# Patient Record
Sex: Male | Born: 1940 | Race: White | Hispanic: No | Smoking: Current every day smoker
Health system: Southern US, Community
[De-identification: ages and names within clinical notes are randomized; demographics above are authoritative.]

## PROBLEM LIST (undated history)

## (undated) DIAGNOSIS — I10 Essential (primary) hypertension: Secondary | ICD-10-CM

## (undated) DIAGNOSIS — Z87442 Personal history of urinary calculi: Secondary | ICD-10-CM

## (undated) DIAGNOSIS — R0600 Dyspnea, unspecified: Secondary | ICD-10-CM

## (undated) DIAGNOSIS — J449 Chronic obstructive pulmonary disease, unspecified: Secondary | ICD-10-CM

## (undated) DIAGNOSIS — K219 Gastro-esophageal reflux disease without esophagitis: Secondary | ICD-10-CM

## (undated) DIAGNOSIS — M1990 Unspecified osteoarthritis, unspecified site: Secondary | ICD-10-CM

## (undated) DIAGNOSIS — Z7951 Long term (current) use of inhaled steroids: Secondary | ICD-10-CM

## (undated) DIAGNOSIS — I499 Cardiac arrhythmia, unspecified: Secondary | ICD-10-CM

## (undated) DIAGNOSIS — I509 Heart failure, unspecified: Secondary | ICD-10-CM

## (undated) HISTORY — DX: Essential (primary) hypertension: I10

## (undated) HISTORY — DX: Chronic obstructive pulmonary disease, unspecified: J44.9

## (undated) HISTORY — DX: Heart failure, unspecified: I50.9

## (undated) HISTORY — DX: Gastro-esophageal reflux disease without esophagitis: K21.9

## (undated) HISTORY — DX: Unspecified osteoarthritis, unspecified site: M19.90

## (undated) HISTORY — DX: Dyspnea, unspecified: R06.00

## (undated) HISTORY — DX: Personal history of urinary calculi: Z87.442

## (undated) HISTORY — DX: Cardiac arrhythmia, unspecified: I49.9

## (undated) HISTORY — DX: Long term (current) use of inhaled steroids: Z79.51

## (undated) HISTORY — PX: LEG SURGERY: SHX1003

---

## 2004-10-02 ENCOUNTER — Ambulatory Visit: Payer: Self-pay | Admitting: Family Medicine

## 2014-10-21 DIAGNOSIS — I480 Paroxysmal atrial fibrillation: Secondary | ICD-10-CM | POA: Insufficient documentation

## 2014-10-21 DIAGNOSIS — I10 Essential (primary) hypertension: Secondary | ICD-10-CM

## 2014-10-21 DIAGNOSIS — F17210 Nicotine dependence, cigarettes, uncomplicated: Secondary | ICD-10-CM | POA: Insufficient documentation

## 2014-10-21 HISTORY — DX: Paroxysmal atrial fibrillation: I48.0

## 2014-10-21 HISTORY — DX: Essential (primary) hypertension: I10

## 2014-10-21 HISTORY — DX: Nicotine dependence, cigarettes, uncomplicated: F17.210

## 2016-01-21 DIAGNOSIS — R062 Wheezing: Secondary | ICD-10-CM | POA: Diagnosis not present

## 2016-01-28 DIAGNOSIS — E782 Mixed hyperlipidemia: Secondary | ICD-10-CM | POA: Diagnosis not present

## 2016-01-28 DIAGNOSIS — R0602 Shortness of breath: Secondary | ICD-10-CM | POA: Diagnosis not present

## 2016-01-28 DIAGNOSIS — R7301 Impaired fasting glucose: Secondary | ICD-10-CM | POA: Diagnosis not present

## 2016-01-28 DIAGNOSIS — R5383 Other fatigue: Secondary | ICD-10-CM | POA: Diagnosis not present

## 2016-01-28 DIAGNOSIS — I480 Paroxysmal atrial fibrillation: Secondary | ICD-10-CM | POA: Diagnosis not present

## 2016-02-21 DIAGNOSIS — R062 Wheezing: Secondary | ICD-10-CM | POA: Diagnosis not present

## 2016-02-24 DIAGNOSIS — R55 Syncope and collapse: Secondary | ICD-10-CM | POA: Diagnosis not present

## 2016-02-24 DIAGNOSIS — Z720 Tobacco use: Secondary | ICD-10-CM | POA: Diagnosis not present

## 2016-02-24 DIAGNOSIS — R0689 Other abnormalities of breathing: Secondary | ICD-10-CM | POA: Diagnosis not present

## 2016-02-24 DIAGNOSIS — J441 Chronic obstructive pulmonary disease with (acute) exacerbation: Secondary | ICD-10-CM | POA: Diagnosis not present

## 2016-02-24 DIAGNOSIS — R062 Wheezing: Secondary | ICD-10-CM | POA: Diagnosis not present

## 2016-02-27 DIAGNOSIS — I10 Essential (primary) hypertension: Secondary | ICD-10-CM | POA: Diagnosis not present

## 2016-02-27 DIAGNOSIS — E784 Other hyperlipidemia: Secondary | ICD-10-CM | POA: Diagnosis not present

## 2016-02-27 DIAGNOSIS — I480 Paroxysmal atrial fibrillation: Secondary | ICD-10-CM | POA: Diagnosis not present

## 2016-02-27 DIAGNOSIS — D518 Other vitamin B12 deficiency anemias: Secondary | ICD-10-CM | POA: Diagnosis not present

## 2016-03-02 DIAGNOSIS — R079 Chest pain, unspecified: Secondary | ICD-10-CM | POA: Diagnosis not present

## 2016-03-02 DIAGNOSIS — I480 Paroxysmal atrial fibrillation: Secondary | ICD-10-CM | POA: Diagnosis not present

## 2016-03-03 DIAGNOSIS — I714 Abdominal aortic aneurysm, without rupture: Secondary | ICD-10-CM | POA: Diagnosis not present

## 2016-03-03 DIAGNOSIS — R0989 Other specified symptoms and signs involving the circulatory and respiratory systems: Secondary | ICD-10-CM | POA: Diagnosis not present

## 2016-03-04 DIAGNOSIS — I739 Peripheral vascular disease, unspecified: Secondary | ICD-10-CM | POA: Diagnosis not present

## 2016-03-05 DIAGNOSIS — R42 Dizziness and giddiness: Secondary | ICD-10-CM | POA: Diagnosis not present

## 2016-03-05 DIAGNOSIS — R002 Palpitations: Secondary | ICD-10-CM | POA: Diagnosis not present

## 2016-03-20 DIAGNOSIS — R062 Wheezing: Secondary | ICD-10-CM | POA: Diagnosis not present

## 2016-03-23 DIAGNOSIS — M25551 Pain in right hip: Secondary | ICD-10-CM | POA: Diagnosis not present

## 2016-03-23 DIAGNOSIS — I480 Paroxysmal atrial fibrillation: Secondary | ICD-10-CM | POA: Diagnosis not present

## 2016-03-23 DIAGNOSIS — R7301 Impaired fasting glucose: Secondary | ICD-10-CM | POA: Diagnosis not present

## 2016-03-23 DIAGNOSIS — M25552 Pain in left hip: Secondary | ICD-10-CM | POA: Diagnosis not present

## 2016-03-23 DIAGNOSIS — R2689 Other abnormalities of gait and mobility: Secondary | ICD-10-CM | POA: Diagnosis not present

## 2016-03-23 DIAGNOSIS — E782 Mixed hyperlipidemia: Secondary | ICD-10-CM | POA: Diagnosis not present

## 2016-03-23 DIAGNOSIS — R5383 Other fatigue: Secondary | ICD-10-CM | POA: Diagnosis not present

## 2016-03-23 DIAGNOSIS — M6281 Muscle weakness (generalized): Secondary | ICD-10-CM | POA: Diagnosis not present

## 2016-03-31 DIAGNOSIS — D518 Other vitamin B12 deficiency anemias: Secondary | ICD-10-CM | POA: Diagnosis not present

## 2016-03-31 DIAGNOSIS — I10 Essential (primary) hypertension: Secondary | ICD-10-CM | POA: Diagnosis not present

## 2016-03-31 DIAGNOSIS — E784 Other hyperlipidemia: Secondary | ICD-10-CM | POA: Diagnosis not present

## 2016-03-31 DIAGNOSIS — I480 Paroxysmal atrial fibrillation: Secondary | ICD-10-CM | POA: Diagnosis not present

## 2016-04-20 DIAGNOSIS — R062 Wheezing: Secondary | ICD-10-CM | POA: Diagnosis not present

## 2016-04-23 DIAGNOSIS — E784 Other hyperlipidemia: Secondary | ICD-10-CM | POA: Diagnosis not present

## 2016-04-23 DIAGNOSIS — D518 Other vitamin B12 deficiency anemias: Secondary | ICD-10-CM | POA: Diagnosis not present

## 2016-04-23 DIAGNOSIS — I10 Essential (primary) hypertension: Secondary | ICD-10-CM | POA: Diagnosis not present

## 2016-04-23 DIAGNOSIS — I480 Paroxysmal atrial fibrillation: Secondary | ICD-10-CM | POA: Diagnosis not present

## 2016-04-27 DIAGNOSIS — E782 Mixed hyperlipidemia: Secondary | ICD-10-CM | POA: Diagnosis not present

## 2016-04-27 DIAGNOSIS — J441 Chronic obstructive pulmonary disease with (acute) exacerbation: Secondary | ICD-10-CM | POA: Diagnosis not present

## 2016-04-27 DIAGNOSIS — I10 Essential (primary) hypertension: Secondary | ICD-10-CM | POA: Diagnosis not present

## 2016-04-27 DIAGNOSIS — R0602 Shortness of breath: Secondary | ICD-10-CM | POA: Diagnosis not present

## 2016-04-27 DIAGNOSIS — E569 Vitamin deficiency, unspecified: Secondary | ICD-10-CM | POA: Diagnosis not present

## 2016-04-27 DIAGNOSIS — N400 Enlarged prostate without lower urinary tract symptoms: Secondary | ICD-10-CM | POA: Diagnosis not present

## 2016-04-27 DIAGNOSIS — R0689 Other abnormalities of breathing: Secondary | ICD-10-CM | POA: Diagnosis not present

## 2016-04-27 DIAGNOSIS — D518 Other vitamin B12 deficiency anemias: Secondary | ICD-10-CM | POA: Diagnosis not present

## 2016-04-27 DIAGNOSIS — E611 Iron deficiency: Secondary | ICD-10-CM | POA: Diagnosis not present

## 2016-04-27 DIAGNOSIS — R5383 Other fatigue: Secondary | ICD-10-CM | POA: Diagnosis not present

## 2016-04-27 DIAGNOSIS — R7301 Impaired fasting glucose: Secondary | ICD-10-CM | POA: Diagnosis not present

## 2016-04-30 DIAGNOSIS — D518 Other vitamin B12 deficiency anemias: Secondary | ICD-10-CM | POA: Diagnosis not present

## 2016-04-30 DIAGNOSIS — M3392 Dermatopolymyositis, unspecified with myopathy: Secondary | ICD-10-CM | POA: Diagnosis not present

## 2016-05-05 DIAGNOSIS — D518 Other vitamin B12 deficiency anemias: Secondary | ICD-10-CM | POA: Diagnosis not present

## 2016-05-18 DIAGNOSIS — D518 Other vitamin B12 deficiency anemias: Secondary | ICD-10-CM | POA: Diagnosis not present

## 2016-05-25 DIAGNOSIS — D518 Other vitamin B12 deficiency anemias: Secondary | ICD-10-CM | POA: Diagnosis not present

## 2016-05-31 DIAGNOSIS — D518 Other vitamin B12 deficiency anemias: Secondary | ICD-10-CM | POA: Diagnosis not present

## 2016-05-31 DIAGNOSIS — I10 Essential (primary) hypertension: Secondary | ICD-10-CM | POA: Diagnosis not present

## 2016-05-31 DIAGNOSIS — I480 Paroxysmal atrial fibrillation: Secondary | ICD-10-CM | POA: Diagnosis not present

## 2016-05-31 DIAGNOSIS — E784 Other hyperlipidemia: Secondary | ICD-10-CM | POA: Diagnosis not present

## 2016-06-01 DIAGNOSIS — D518 Other vitamin B12 deficiency anemias: Secondary | ICD-10-CM | POA: Diagnosis not present

## 2016-06-08 DIAGNOSIS — D518 Other vitamin B12 deficiency anemias: Secondary | ICD-10-CM | POA: Diagnosis not present

## 2016-06-23 DIAGNOSIS — D518 Other vitamin B12 deficiency anemias: Secondary | ICD-10-CM | POA: Diagnosis not present

## 2016-07-07 DIAGNOSIS — D518 Other vitamin B12 deficiency anemias: Secondary | ICD-10-CM | POA: Diagnosis not present

## 2016-07-20 DIAGNOSIS — D518 Other vitamin B12 deficiency anemias: Secondary | ICD-10-CM | POA: Diagnosis not present

## 2016-07-27 DIAGNOSIS — R0989 Other specified symptoms and signs involving the circulatory and respiratory systems: Secondary | ICD-10-CM | POA: Diagnosis not present

## 2016-07-27 DIAGNOSIS — E782 Mixed hyperlipidemia: Secondary | ICD-10-CM | POA: Diagnosis not present

## 2016-07-27 DIAGNOSIS — R7301 Impaired fasting glucose: Secondary | ICD-10-CM | POA: Diagnosis not present

## 2016-07-27 DIAGNOSIS — R5383 Other fatigue: Secondary | ICD-10-CM | POA: Diagnosis not present

## 2016-07-27 DIAGNOSIS — I10 Essential (primary) hypertension: Secondary | ICD-10-CM | POA: Diagnosis not present

## 2016-07-27 DIAGNOSIS — I480 Paroxysmal atrial fibrillation: Secondary | ICD-10-CM | POA: Diagnosis not present

## 2016-07-27 DIAGNOSIS — J441 Chronic obstructive pulmonary disease with (acute) exacerbation: Secondary | ICD-10-CM | POA: Diagnosis not present

## 2016-08-03 DIAGNOSIS — D518 Other vitamin B12 deficiency anemias: Secondary | ICD-10-CM | POA: Diagnosis not present

## 2016-08-26 DIAGNOSIS — D518 Other vitamin B12 deficiency anemias: Secondary | ICD-10-CM | POA: Diagnosis not present

## 2016-09-08 DIAGNOSIS — D518 Other vitamin B12 deficiency anemias: Secondary | ICD-10-CM | POA: Diagnosis not present

## 2016-09-21 DIAGNOSIS — E569 Vitamin deficiency, unspecified: Secondary | ICD-10-CM | POA: Diagnosis not present

## 2016-09-21 DIAGNOSIS — R0600 Dyspnea, unspecified: Secondary | ICD-10-CM | POA: Diagnosis not present

## 2016-09-21 DIAGNOSIS — I480 Paroxysmal atrial fibrillation: Secondary | ICD-10-CM | POA: Diagnosis not present

## 2016-09-21 DIAGNOSIS — R002 Palpitations: Secondary | ICD-10-CM | POA: Diagnosis not present

## 2016-09-21 DIAGNOSIS — D518 Other vitamin B12 deficiency anemias: Secondary | ICD-10-CM | POA: Diagnosis not present

## 2016-09-21 DIAGNOSIS — L089 Local infection of the skin and subcutaneous tissue, unspecified: Secondary | ICD-10-CM | POA: Diagnosis not present

## 2016-09-21 DIAGNOSIS — E782 Mixed hyperlipidemia: Secondary | ICD-10-CM | POA: Diagnosis not present

## 2016-09-21 DIAGNOSIS — R7301 Impaired fasting glucose: Secondary | ICD-10-CM | POA: Diagnosis not present

## 2016-09-21 DIAGNOSIS — I739 Peripheral vascular disease, unspecified: Secondary | ICD-10-CM | POA: Diagnosis not present

## 2016-09-21 DIAGNOSIS — J449 Chronic obstructive pulmonary disease, unspecified: Secondary | ICD-10-CM | POA: Diagnosis not present

## 2016-09-21 DIAGNOSIS — R079 Chest pain, unspecified: Secondary | ICD-10-CM | POA: Diagnosis not present

## 2016-09-24 DIAGNOSIS — R002 Palpitations: Secondary | ICD-10-CM | POA: Diagnosis not present

## 2016-09-24 DIAGNOSIS — R42 Dizziness and giddiness: Secondary | ICD-10-CM | POA: Diagnosis not present

## 2016-10-01 DIAGNOSIS — I480 Paroxysmal atrial fibrillation: Secondary | ICD-10-CM | POA: Diagnosis not present

## 2016-10-01 DIAGNOSIS — I5023 Acute on chronic systolic (congestive) heart failure: Secondary | ICD-10-CM | POA: Diagnosis not present

## 2016-10-01 DIAGNOSIS — J449 Chronic obstructive pulmonary disease, unspecified: Secondary | ICD-10-CM | POA: Diagnosis not present

## 2016-10-06 DIAGNOSIS — I509 Heart failure, unspecified: Secondary | ICD-10-CM | POA: Diagnosis not present

## 2016-10-06 DIAGNOSIS — D518 Other vitamin B12 deficiency anemias: Secondary | ICD-10-CM | POA: Diagnosis not present

## 2016-10-07 DIAGNOSIS — R079 Chest pain, unspecified: Secondary | ICD-10-CM | POA: Diagnosis not present

## 2016-10-07 DIAGNOSIS — I5020 Unspecified systolic (congestive) heart failure: Secondary | ICD-10-CM | POA: Diagnosis not present

## 2016-10-07 DIAGNOSIS — I4510 Unspecified right bundle-branch block: Secondary | ICD-10-CM | POA: Diagnosis not present

## 2016-10-07 DIAGNOSIS — I480 Paroxysmal atrial fibrillation: Secondary | ICD-10-CM | POA: Diagnosis not present

## 2016-10-07 DIAGNOSIS — I4891 Unspecified atrial fibrillation: Secondary | ICD-10-CM | POA: Diagnosis not present

## 2016-10-14 DIAGNOSIS — I4891 Unspecified atrial fibrillation: Secondary | ICD-10-CM | POA: Diagnosis not present

## 2016-10-18 DIAGNOSIS — I480 Paroxysmal atrial fibrillation: Secondary | ICD-10-CM | POA: Diagnosis not present

## 2016-10-19 DIAGNOSIS — D518 Other vitamin B12 deficiency anemias: Secondary | ICD-10-CM | POA: Diagnosis not present

## 2016-10-21 DIAGNOSIS — I4510 Unspecified right bundle-branch block: Secondary | ICD-10-CM | POA: Diagnosis not present

## 2016-10-21 DIAGNOSIS — I480 Paroxysmal atrial fibrillation: Secondary | ICD-10-CM | POA: Diagnosis not present

## 2016-10-27 DIAGNOSIS — R0689 Other abnormalities of breathing: Secondary | ICD-10-CM | POA: Diagnosis not present

## 2016-10-27 DIAGNOSIS — R0609 Other forms of dyspnea: Secondary | ICD-10-CM | POA: Diagnosis not present

## 2016-10-27 DIAGNOSIS — I480 Paroxysmal atrial fibrillation: Secondary | ICD-10-CM | POA: Diagnosis not present

## 2016-10-27 DIAGNOSIS — I5023 Acute on chronic systolic (congestive) heart failure: Secondary | ICD-10-CM | POA: Diagnosis not present

## 2016-10-27 DIAGNOSIS — R0600 Dyspnea, unspecified: Secondary | ICD-10-CM | POA: Diagnosis not present

## 2016-10-27 DIAGNOSIS — J441 Chronic obstructive pulmonary disease with (acute) exacerbation: Secondary | ICD-10-CM | POA: Diagnosis not present

## 2016-10-27 DIAGNOSIS — R0602 Shortness of breath: Secondary | ICD-10-CM | POA: Diagnosis not present

## 2016-10-27 DIAGNOSIS — N178 Other acute kidney failure: Secondary | ICD-10-CM | POA: Diagnosis not present

## 2016-10-27 DIAGNOSIS — I700 Atherosclerosis of aorta: Secondary | ICD-10-CM | POA: Diagnosis not present

## 2016-11-02 DIAGNOSIS — D518 Other vitamin B12 deficiency anemias: Secondary | ICD-10-CM | POA: Diagnosis not present

## 2016-11-09 DIAGNOSIS — F419 Anxiety disorder, unspecified: Secondary | ICD-10-CM | POA: Diagnosis not present

## 2016-11-09 DIAGNOSIS — J449 Chronic obstructive pulmonary disease, unspecified: Secondary | ICD-10-CM | POA: Diagnosis not present

## 2016-11-09 DIAGNOSIS — I5032 Chronic diastolic (congestive) heart failure: Secondary | ICD-10-CM | POA: Diagnosis not present

## 2016-11-09 DIAGNOSIS — G629 Polyneuropathy, unspecified: Secondary | ICD-10-CM | POA: Diagnosis not present

## 2016-11-09 DIAGNOSIS — E7800 Pure hypercholesterolemia, unspecified: Secondary | ICD-10-CM | POA: Diagnosis not present

## 2016-11-09 DIAGNOSIS — M1990 Unspecified osteoarthritis, unspecified site: Secondary | ICD-10-CM | POA: Diagnosis not present

## 2016-11-09 DIAGNOSIS — I482 Chronic atrial fibrillation: Secondary | ICD-10-CM | POA: Diagnosis not present

## 2016-11-09 DIAGNOSIS — Z23 Encounter for immunization: Secondary | ICD-10-CM | POA: Diagnosis not present

## 2016-11-09 DIAGNOSIS — Z7289 Other problems related to lifestyle: Secondary | ICD-10-CM | POA: Diagnosis not present

## 2016-11-09 DIAGNOSIS — I959 Hypotension, unspecified: Secondary | ICD-10-CM | POA: Diagnosis not present

## 2016-11-09 DIAGNOSIS — R55 Syncope and collapse: Secondary | ICD-10-CM | POA: Diagnosis not present

## 2016-11-09 DIAGNOSIS — E860 Dehydration: Secondary | ICD-10-CM | POA: Diagnosis not present

## 2016-11-09 DIAGNOSIS — Z79899 Other long term (current) drug therapy: Secondary | ICD-10-CM | POA: Diagnosis not present

## 2016-11-09 DIAGNOSIS — S0990XA Unspecified injury of head, initial encounter: Secondary | ICD-10-CM | POA: Diagnosis not present

## 2016-11-09 DIAGNOSIS — I110 Hypertensive heart disease with heart failure: Secondary | ICD-10-CM | POA: Diagnosis not present

## 2016-11-09 DIAGNOSIS — S299XXA Unspecified injury of thorax, initial encounter: Secondary | ICD-10-CM | POA: Diagnosis not present

## 2016-11-09 DIAGNOSIS — Z7902 Long term (current) use of antithrombotics/antiplatelets: Secondary | ICD-10-CM | POA: Diagnosis not present

## 2016-11-09 DIAGNOSIS — R404 Transient alteration of awareness: Secondary | ICD-10-CM | POA: Diagnosis not present

## 2016-11-09 DIAGNOSIS — N179 Acute kidney failure, unspecified: Secondary | ICD-10-CM | POA: Diagnosis not present

## 2016-11-17 ENCOUNTER — Other Ambulatory Visit: Payer: Self-pay

## 2016-11-17 NOTE — Patient Outreach (Signed)
[place] [facility]) Care Management  11/17/2016  Kyle Farley 1940/08/24 [id number]     Transition of Care Referral  Referral Date: 11/17/16 Referral Source: HTA Discharge Report Date of Admission: 11/09/16 Diagnosis: syncope Date of Discharge: 11/11/16 Facility: [place]: HTA    Outreach attempt # 1 to patient. No answer at present and RN CM left HIPAA compliant voicemail message along with contact info.    Plan: RN CM will make outreach attempt to patient within one business day if no return call.    Enzo Montgomery, RN,BSN,CCM [place] Management Telephonic Care Management Coordinator Direct Phone: [phone] Toll Free: [phone] Fax: [phone]

## 2016-11-18 ENCOUNTER — Other Ambulatory Visit: Payer: Self-pay

## 2016-11-18 NOTE — Patient Outreach (Signed)
[facility]) Care Management  11/18/2016  Kyle Farley Apr 14, 1940 [id number]   Transition of Care Referral  Referral Date: 11/17/16 Referral Source: HTA Discharge Report Date of Admission: 11/09/16 Diagnosis: syncope Date of Discharge: 11/11/16 Facility: [place]: HTA    Outreach attempt #2 to patient. No answer at present. No alternate numbers to attempt at this time.       Plan: RN CM will make outreach attempt to patient within one business day if no return call.   Enzo Montgomery, RN,BSN,CCM [place] Management Telephonic Care Management Coordinator Direct Phone: [phone] Toll Free: [phone] Fax: [phone]

## 2016-11-19 ENCOUNTER — Other Ambulatory Visit: Payer: Self-pay

## 2016-11-19 NOTE — Patient Outreach (Signed)
[place] [facility]) Care Management  11/19/2016  KWALI WRINKLE [birth year] [id number]   Transition of Care Referral  Referral Date: 11/17/16 Referral Source: HTA Discharge Report Date of Admission: 11/09/16 Diagnosis: syncope Date of Discharge: 11/11/16 Facility: [place]: HTA   Outreach attempt #3 to patient. No answer at present.     Plan: RN CM will send unsuccessful outreach letter to patient and close case within 10 business days.    Enzo Montgomery, RN,BSN,CCM [place] Management Telephonic Care Management Coordinator Direct Phone: [phone] Toll Free: [phone] Fax: [phone]

## 2016-11-22 DIAGNOSIS — I70213 Atherosclerosis of native arteries of extremities with intermittent claudication, bilateral legs: Secondary | ICD-10-CM | POA: Diagnosis not present

## 2016-11-22 DIAGNOSIS — R55 Syncope and collapse: Secondary | ICD-10-CM | POA: Diagnosis not present

## 2016-11-22 DIAGNOSIS — E860 Dehydration: Secondary | ICD-10-CM | POA: Diagnosis not present

## 2016-11-22 DIAGNOSIS — R0602 Shortness of breath: Secondary | ICD-10-CM | POA: Diagnosis not present

## 2016-11-22 DIAGNOSIS — D518 Other vitamin B12 deficiency anemias: Secondary | ICD-10-CM | POA: Diagnosis not present

## 2016-11-22 DIAGNOSIS — I480 Paroxysmal atrial fibrillation: Secondary | ICD-10-CM | POA: Diagnosis not present

## 2016-12-03 ENCOUNTER — Other Ambulatory Visit: Payer: Self-pay

## 2016-12-03 NOTE — Patient Outreach (Signed)
[facility]) Care Management  12/03/2016  Kyle Farley [birth year] [id number]     Transition of Care Referral  Referral Date: 11/17/16 Referral Source: HTA Discharge Report Date of Admission: 11/09/16 Diagnosis: syncope Date of Discharge: 11/11/16 Facility: [facility]: HTA   Multiple attempts to establish contact with patient without success. No response from letter mailed to patient. Case is being closed at this time.     Plan: RN CM will notify [facility] administrative assistant of case status. RN CM unable to send MD letter as no PCP listed on file.   Enzo Montgomery, RN,BSN,CCM [place] Management Telephonic Care Management Coordinator Direct Phone: [phone] Toll Free: [phone] Fax: [phone]

## 2016-12-22 DIAGNOSIS — J441 Chronic obstructive pulmonary disease with (acute) exacerbation: Secondary | ICD-10-CM | POA: Diagnosis not present

## 2016-12-22 DIAGNOSIS — D518 Other vitamin B12 deficiency anemias: Secondary | ICD-10-CM | POA: Diagnosis not present

## 2016-12-22 DIAGNOSIS — M545 Low back pain: Secondary | ICD-10-CM | POA: Diagnosis not present

## 2016-12-22 DIAGNOSIS — M3392 Dermatopolymyositis, unspecified with myopathy: Secondary | ICD-10-CM | POA: Diagnosis not present

## 2016-12-22 DIAGNOSIS — I70213 Atherosclerosis of native arteries of extremities with intermittent claudication, bilateral legs: Secondary | ICD-10-CM | POA: Diagnosis not present

## 2016-12-22 DIAGNOSIS — I480 Paroxysmal atrial fibrillation: Secondary | ICD-10-CM | POA: Diagnosis not present

## 2016-12-22 DIAGNOSIS — I5023 Acute on chronic systolic (congestive) heart failure: Secondary | ICD-10-CM | POA: Diagnosis not present

## 2017-01-04 DIAGNOSIS — D518 Other vitamin B12 deficiency anemias: Secondary | ICD-10-CM | POA: Diagnosis not present

## 2017-01-19 DIAGNOSIS — D518 Other vitamin B12 deficiency anemias: Secondary | ICD-10-CM | POA: Diagnosis not present

## 2017-01-20 ENCOUNTER — Other Ambulatory Visit: Payer: Self-pay

## 2017-01-20 NOTE — Patient Outreach (Signed)
[place] [facility]) Care Management  01/20/2017  WARREN LINDAHL May 13, 1940 [id number]   Telephone Screen  Referral Date: 01/20/17 Referral Source: Episource-HTA Referral Reason: " member unable to afford [place] med" Insurance: HTA  Outreach attempt # 1 to patient. Spoke with patient and screening completed.   Social: Patient resides in his home alone. He voices he is independent with all ADLs/IADLs. He drives himself to medical appts. He voices last fall around [place] of last year. He denies using any assistive devices.     Conditions: Patient reports history of A-fib and COPD. Per referral patient also has history of HTN, OA,and HLD. He voices that he does become short of breath at times with exertion. He is working and adhering to MD recommendations on how to manage his respiratory status. He voices A-fib is controlled and no recent abnormal cardiac events.     Medications: He voices he is taking 10-12 meds. Patient states he is having difficulty affording meds but it became increasingly difficult once he hit the doughnut hole. RN CM advised patient that this was a new year and he should no longer be in doughnut hole. He states that he still is having trouble affording meds and has several meds at the pharmacy that he will not be able to pick up until he gets paid.    Appointments: Patient voices he is followed by Laren Boom, PA (with [facility]) and has f/u appt on tomorrow.  Advance Directives: None. Declined info at this time.    Consent:THN services reviewed and discussed. Patient gave verbal consent for [facility] services.    Plan: RN CM will notify [facility] administrative assistant of case status. RN CM will send [facility] pharmacy referral for possible med assistance. RN CM will send [place] for further disease mgmt and support.   Enzo Montgomery, RN,BSN,CCM [place] Management Telephonic Care Management Coordinator Direct Phone:  [phone] Toll Free: [phone] Fax: [phone]

## 2017-01-21 ENCOUNTER — Encounter: Payer: Self-pay | Admitting: *Deleted

## 2017-01-21 DIAGNOSIS — E782 Mixed hyperlipidemia: Secondary | ICD-10-CM | POA: Diagnosis not present

## 2017-01-21 DIAGNOSIS — Z5181 Encounter for therapeutic drug level monitoring: Secondary | ICD-10-CM | POA: Diagnosis not present

## 2017-01-21 DIAGNOSIS — I5023 Acute on chronic systolic (congestive) heart failure: Secondary | ICD-10-CM | POA: Diagnosis not present

## 2017-01-21 DIAGNOSIS — I480 Paroxysmal atrial fibrillation: Secondary | ICD-10-CM | POA: Diagnosis not present

## 2017-01-21 DIAGNOSIS — J441 Chronic obstructive pulmonary disease with (acute) exacerbation: Secondary | ICD-10-CM | POA: Diagnosis not present

## 2017-01-24 ENCOUNTER — Encounter: Payer: Self-pay | Admitting: *Deleted

## 2017-01-24 ENCOUNTER — Other Ambulatory Visit: Payer: Self-pay

## 2017-01-24 ENCOUNTER — Other Ambulatory Visit: Payer: Self-pay | Admitting: *Deleted

## 2017-01-24 DIAGNOSIS — E785 Hyperlipidemia, unspecified: Secondary | ICD-10-CM | POA: Insufficient documentation

## 2017-01-24 DIAGNOSIS — N200 Calculus of kidney: Secondary | ICD-10-CM

## 2017-01-24 DIAGNOSIS — J441 Chronic obstructive pulmonary disease with (acute) exacerbation: Secondary | ICD-10-CM

## 2017-01-24 DIAGNOSIS — I509 Heart failure, unspecified: Secondary | ICD-10-CM | POA: Insufficient documentation

## 2017-01-24 HISTORY — DX: Hyperlipidemia, unspecified: E78.5

## 2017-01-24 HISTORY — DX: Calculus of kidney: N20.0

## 2017-01-24 HISTORY — DX: Chronic obstructive pulmonary disease with (acute) exacerbation: J44.1

## 2017-01-24 NOTE — Patient Outreach (Addendum)
[facility]) Care Management  [place]  01/24/2017   NKOSI CORTRIGHT [birth year] [id number]  RN Health Coach Initial Assessment  Referral Date:  01/20/2017 Referral Source:  Episource Screening Reason for Referral:  "[name]" & Disease Management Education Insurance:  [employer]   Outreach Attempt:  Successful telephone outreach to patient for initial telephone assessment.  HIPAA verified.  Patient completed initial telephone assessment.  Social:  Patient lives at home alone.  Reports his is independent with his ADLs and IADLs.  Ambulates independently.  Does report multiple falls within the last year.  Stating his last fall was the beginning of November, out in the yard with no injuries sustained.  States the fall was related to dehydration and bradycardia per his hospital visit after the fall.  Patient states he drives himself to his medical appointments.  DME in home available to patient include: scale, blood pressure machine, nebulizer, glasses and dentures.  Conditions:  Per chart review and discussion with patient, PMH include:  COPD, atrial fibrillation, hypertension, congestive heart failure, kidney stones, hyperlipidemia, osteoarthritis, and continues to smoke about 0.5-1 pack per day cigarettes.  Patient states his last hospitalization was related to his falls, dehydration, and bradycardia.  Reports he can sometimes feel when his pulse is low and when he feels bad he will take his blood pressure and pulse.  States he usually monitors his blood pressure and pulse about 3 times a week.  States his breathing has been ok, reports no sick days over the last month.  States he sometimes gets short of breath when he over exerts himself or when he is tying his shoes.  Does state at this time he does not wish to stop smoking.  Patient states he has lost about 23 pounds over the last 3 months and last week joined the [employer] with [employer]  and [employer].  Plans to go to the gym about 4 times a week.  Medications:  Patient reports taking about 14 medications.  States last month he was in the donut hole and could not afford many of his medications.  Patient stating he ran out of several mediations over the last week and has not been able to afford new prescriptions, one of which is his eliquis.  Discussed the dangers of not taking eliquis related to blood clots and stroke.  Patient stated his understanding and stated he would "get what he could on Wednesday when he got paid and would make sure eliquis was one of the first ones he retrieved".  [place] referral was already placed from initial screening.  RN Health Coach encouraged patient to contact [facility] staff if he is unable to get medications by Wednesday.    Encounter Medications:  Outpatient Encounter Medications as of 01/24/2017  Medication Sig Note  . apixaban (ELIQUIS) 5 MG TABS tablet Take 5 mg by mouth 2 (two) times daily.   Marland Kitchen arformoterol (BROVANA) 15 MCG/2ML NEBU Take 15 mcg by nebulization 2 (two) times daily.   . budesonide-formoterol (SYMBICORT) 80-4.5 MCG/ACT inhaler Inhale 2 puffs into the lungs 2 (two) times daily.   Marland Kitchen diltiazem (CARDIZEM CD) 180 MG 24 hr capsule Take 180 mg by mouth daily.   . flecainide (TAMBOCOR) 50 MG tablet Take 50 mg by mouth 2 (two) times daily.   . furosemide (LASIX) 20 MG tablet Take 20 mg by mouth daily. Take 2 tablets once daily   . gabapentin (NEURONTIN) 600 MG tablet  Take 600 mg by mouth 3 (three) times daily.   Marland Kitchen ipratropium-albuterol (DUONEB) 0.5-2.5 (3) MG/3ML SOLN Take 3 mLs by nebulization 2 (two) times daily.   Marland Kitchen lisinopril (PRINIVIL,ZESTRIL) 20 MG tablet Take 20 mg by mouth 2 (two) times daily.   Marland Kitchen loratadine (CLARITIN) 10 MG tablet Take 10 mg by mouth daily.   . magnesium oxide (MAG-OX) 400 MG tablet Take 400 mg by mouth daily.   . Pitavastatin Calcium (LIVALO) 4 MG TABS Take 4 mg by mouth daily.   . roflumilast (DALIRESP) 500 MCG TABS  tablet Take 500 mcg by mouth daily.   . Fluticasone-Umeclidin-Vilant (TRELEGY ELLIPTA) 100-62.5-25 MCG/INH AEPB Inhale 1 puff into the lungs daily. 01/24/2017: Not taking, has ran out of this medication about a week ago   No facility-administered encounter medications on file as of 01/24/2017.     Functional Status:  In your present state of health, do you have any difficulty performing the following activities: 01/24/2017  Hearing? N  Vision? N  Difficulty concentrating or making decisions? N  Walking or climbing stairs? N  Dressing or bathing? N  Doing errands, shopping? N  Preparing Food and eating ? N  Using the Toilet? N  In the past six months, have you accidently leaked urine? N  Do you have problems with loss of bowel control? N  Managing your Medications? N  Comment difficulties affording medications  Managing your Finances? N  Housekeeping or managing your Housekeeping? N  Some recent data might be hidden    Fall/Depression Screening: Fall Risk  01/24/2017  Falls in the past year? Yes  Number falls in past yr: 2 or more  Injury with Fall? No  Risk for fall due to : History of fall(s);Medication side effect  Follow up Education provided;Falls prevention discussed   PHQ 2/9 Scores 01/24/2017 01/20/2017  PHQ - 2 Score 0 0    THN CM Care Plan Problem One     Most Recent Value  Care Plan Problem One  Knowledge deficiet related to self care with COPD  Role Documenting the Problem One  Health [place] for Problem One  Active  THN Long Term Goal   Patient will report no hospitalizations within the next 90 days.  THN Long Term Goal Start Date  01/24/17  Interventions for Problem One Long Term Goal  Reviewed and discussed current care plan and goals with patient, encouraged patient to keep medical apointments, encouraged patient to think about quiting smoking, encouraged patient to think about switching to decaffineated coffee,  encouraged patient to continue with goals of  going to [facility] 4 times a week  THN CM Short Term Goal #1   Patient will report no falls in the next 30 days.  THN CM Short Term Goal #1 Start Date  01/24/17  Interventions for Short Term Goal #1  Falls prevention reviewed and discussed, encouraged patient to monitor his pulse,  EMMI sent on falls precautions  THN CM Short Term Goal #2   Patient will report 5 pound weight loss in the next 30 days.  THN CM Short Term Goal #2 Start Date  01/24/17  Interventions for Short Term Goal #2  ongratulated patient on his 23 pound weight loss already,  congrtulated patient on him joining the [employer] last week and encouraged him to incease his actviity level slowly as tolerated     Appointments:  Patient reports he seen his primary care provider [name] NP on this past Friday, January 21, 2017.  He needs to schedule follow up appointment.  Advanced Directives:  Patient states he has [place] in place and does not wish to make any changes.   Consent:  [facility] Services discussed and reviewed with patient.  Patient verbally agrees to [place] monthly outreaches and [place] assistance.  Plan: RN Health Coach will follow up with [facility] Pharmacist for medication reconciliation and medication assistance. RN Health Coach will send patient Garment/textile technologist. RN Health Coach will send patient Falls Prevention EMMI. RN Health Coach will send patient Getting Up from [place]. RN Health Coach will send patient Living Well with COPD Booklet. RN Health Coach will send patient Atrial Fibrillation Educational Booklet. RN Health Coach will send patient 2019 Calendar Booklet. RN Health Coach will send primary care provider Barriers Letter. RN Health Coach will route primary care provider initial assessment note. RN Health Coach will make next monthly telephone outreach to patient in the month of February.  [place] [phone] [email or web address]

## 2017-02-01 DIAGNOSIS — D518 Other vitamin B12 deficiency anemias: Secondary | ICD-10-CM | POA: Diagnosis not present

## 2017-02-02 ENCOUNTER — Other Ambulatory Visit: Payer: Self-pay | Admitting: Pharmacist

## 2017-02-02 NOTE — Patient Outreach (Signed)
Kyle Farley is referred to pharmacy for medication review and medication assistance. Left a HIPAA compliant message on the patient's voicemail. If have not heard from patient by 02/04/17, will give him another call at that time.  [name], PharmD, [place] Management [phone]

## 2017-02-04 ENCOUNTER — Other Ambulatory Visit: Payer: Self-pay | Admitting: Pharmacist

## 2017-02-04 NOTE — Patient Outreach (Signed)
[facility] [facility]) Care Management  02/04/2017  Kyle Farley April 10, 1940 [id number]   Call again to patient's PCP regarding concern about patient currently taking Brovana, Symbicort and Bevespi each twice daily and the risk of toxicity from this therapeutic duplication. Note that all three of these medications contain long-acting beta-2 agonists and that this duplication from all three medications puts patient at an increased risk of toxicity, particularly cardiovascular.   Speak with patient's PCP, [name]. Danae Chen reports that, of the inhalers, she currently only wanted the patient to be taking the Brovana nebulizer solution. Let provider know that the Brovana nebulizer solution is not a covered option through the patient's plan, but that I can call to request an override and see what the cost will be. Danae Chen asks that I do this, as she reports that she has concerns about the patient's ability to be adherent with a metered dose inhaler, as opposed to a nebulizer solution. Danae Chen reports that she will have her office call the patient to let him know that he should currently just be using the Brovana nebulizer solution twice daily, not the Symbicort and Bevespi. Danae Chen also states that she can give the patient further samples of the Brovana at his next visit.  Also mention to provider that patient reports that patient is not currently prescribed a rescue inhaler, such as albuterol. Danae Chen says that he had been in the past and that she will call this into the patient's pharmacy.  Regarding request that provider consider switching patient from Livalo to an alternative statin for cost savings, Danae Chen states that he was previously on atorvastatin, simvastatin and rosuvastatin, but unable to tolerate these.  [name], PharmD, [place] Management [phone]

## 2017-02-04 NOTE — Patient Outreach (Signed)
[place] [facility]) Care Management  02/04/2017  Kyle Farley 07-14-1940 [id number]  Call to patient's PCP regarding concern about patient currently taking Brovana, Symbicort and Bevespi each twice daily and the risk of toxicity from this therapeutic duplication. Note that all three of these medications contain long-acting beta-2 agonists and that this duplication from all three medications puts patient at an increased risk of toxicity, particularly cardiovascular. Per note in Epic from patient's cardiologist from 10/21/16, patient has a history of atrial fibrillation. Leave message with [name] in the office, requesting a call back as soon as possible.  Also mention in message to provider that patient reports that he is not currently prescribed a rescue inhaler, such as albuterol. Will also request provider consider switching patient from Livalo to rosuvastatin, if appropriate, for cost savings.   [name], PharmD, [place] Management [phone]

## 2017-02-04 NOTE — Patient Outreach (Signed)
Kyle Farley is referred to pharmacy for medication review and medication assistance. Called and spoke with patient. HIPAA identifiers verified and verbal consent received.  Kyle Farley reports that he was in the coverage gap of his insurance at the end of last year and unable to get several of his medications. Reports that he now has all of his medications, except for his Livalo and Daliresp. Patient confirms that he is taking his Eliquis as prescribed. Counsel patient on the importance of medication adherence. Note that per the formulary on the [employer] website, both Livalo and Daliresp are tier 3 options for the patient. Let patient know that with the new year, he should expect these prescriptions to each cost $45 for a 30 day supply or $90 for a 90 day supply. Kyle Farley denies having been on an alternative statin in the past. Note that rosuvastatin is a tier 1 alternative on the formulary.   Kyle Farley reports that he does not qualify for extra help through Social Security due to his resources. Discuss with patient patient assistance applications through the manufacturers.  Kyle Farley reports that he has had many changes to his respiratory medications due to formulary concerns, cost and samples. Update patient's Epic record accordingly. Reports that he is currently taking Brovana nebulizer solution twice daily, Symbicort twice daily and Bevespi twice daily. Note that all three of these medications contain long-acting beta-2 agonists and that this duplication from all three medications puts patient at an increased risk of toxicity, particularly cardiovascular. Per note in Epic from patient's cardiologist from 10/21/16, patient has a history of atrial fibrillation. Let Kyle Farley know that I will contact his PCP right now regarding this duplication and to request clarification on how the patient should proceed. Will also note that the patient currently denies having a rescue inhaler.  Patient  denies any further medication questions at this time.  PLAN  1) Will call now to patient's PCP regarding concern about patient currently taking Brovana, Symbicort and Bevespi each twice daily and the risk of toxicity from this therapeutic duplication. Will also mention to provider that patient reports that he is not currently prescribed a rescue inhaler, such as albuterol. Will also request provider consider switching patient from Livalo to rosuvastatin, if appropriate, for cost savings.   2) Will call to follow up with Kyle Farley as soon as I have heard back from his PCP. Will also further discuss patient assistance programs.  [name], PharmD, [place] Management [phone]

## 2017-02-07 ENCOUNTER — Other Ambulatory Visit: Payer: Self-pay | Admitting: Pharmacist

## 2017-02-09 ENCOUNTER — Other Ambulatory Visit: Payer: Self-pay | Admitting: Pharmacist

## 2017-02-09 NOTE — Patient Outreach (Signed)
Outreach call to patient's PCP, [name], regarding medication assistance for the patient's COPD medicine. Leave a message with [name] in the office letting Danae Chen know that the override has been put in with [employer] to allow the prescription for Brovana to go through his insurance. However, the cost to the patient is $104.51 (20% of the cost of the medication) per month. Patient reports that this is unaffordable to him. Let provider know that neither the manufacturer for [facility] or that for [employer] has a patient assistance program that the patient would be eligible for.  Note provider's concern about patient's ability to be adherent to a hand-held inhaler. Ask provider if she would consider having the patient use such a device with a spacer +/- mask. If so, recommend that provider consider prescribing a Dulera inhaler for the patient, as I could assist that patient with applying for patient assistance through the manufacturer for this medication.  If have not heard back from the PCP office by 02/11/17, will call again at that time.  [name], PharmD, [place] Management [phone]

## 2017-02-09 NOTE — Patient Outreach (Signed)
[place] [facility]) Care Management  02/09/2017  Kyle Farley 1940-08-25 [id number]   Call to follow up with [name] regarding his COPD medication regimen. Speak with patient. HIPAA identifiers verified and verbal consent received.  Confirm with [name] that per his PCP, he is currently to be using the Brovana nebulizer solution twice daily, but not currently using the Symbicort or Bevespi inhalers. Patient verbalizes understanding. Also counsel patient about the use of an albuterol rescue inhaler and let patient know that his PCP has called his prescription into his pharmacy. Counsel patient on the importance of medication adherence and advise him to go to pick up his albuterol inhaler, as well as his Livalo and Daliresp from his pharmacy. Patient states that he will do so.   Patient reports that he has an appointment with his PCP on 02/21/17. Request that patient bring all of his medications with him to this visit to review with his PCP.  Patient reports that he has about a 30 day supply of his Brovana left. Let patient know that his PCP reported that she has samples for him at her office. Note that provider's office is closed today for the holiday. However, let patient know that I will call to follow up with his PCP to discuss whether there is a potential alternative option for his COPD medication therapy that would be affordable to him.  [name] denies any further medication questions/concerns at this time. Confirm that patient has my phone number.  [name], PharmD, [place] Management [phone]

## 2017-02-11 ENCOUNTER — Other Ambulatory Visit: Payer: Self-pay | Admitting: Pharmacist

## 2017-02-11 NOTE — Patient Outreach (Signed)
[place] [facility]) Care Management  [place]   02/11/2017  Kyle Farley Jun 12, 1940 [id number]  Subjective: Outreach to Kyle Farley regarding medication assistance. Called and spoke with patient. HIPAA identifiers verified and verbal consent received.   Patient reports that he has been feeling well. Patient reports that he has been using his Brovana nebulizer solution twice daily as directed. Reports that he also picked up his Proair inhaler from his pharmacy and has been using this as needed for shortness of breath. Reports that he will continue to use his ipratropium + albuterol nebulizer solution twice daily as directed. Denies using Symbicort or Bevespi inhalers, as discontinued by his PCP.  Perform medication review with patient. Review each medication including name, dose, administration and indication. Patient confirms that he is taking his Eliquis twice daily as directed. Counsel patient about risk of sedation and dizziness with gabapentin. Patient denies experiencing these side effects.  Patient reports that he organizes and manages his own medications, keeping them on the breakfast table where he eats to help him to remember to take his medication according to their labels at the three different times of the day. Denies any missed doses or difficulty with remembering to take his medications. Reports that he has used a pillbox in the past to take his medications, but reports that he prefers the system that he is using now better.  When [name] and I spoke last week about applying for extra help through Social Security, patient reported that he does not qualify for extra help through Social Security due to his resources. However, as we discuss patient assistance programs with [name] today, patient reports that he does not have resources exceeding the limits for the extra help through [employer].   Complete extra help application with the patient. Received  permission patient to submit the application based on the answers that he provided. Advise patient that he should expect to receive a response in the mail from [place] in 2-4 weeks, but that could be longer given the current partial government shutdown. Advise patient that if he receives a denial letter, he should retain this letter to be used for applying for assistance through the medication manufacturer. Patient verbalizes understanding and states that he will give me a call as soon as he receives this letter from [employer].  Patient reports that he has still been unable to afford to pick up his Livalo or Daliresp prescriptions from the pharmacy, but that he will pick these up as soon as he can. Reports that he has gotten a little behind with his finances since he needed to save up to pay his [employer].  Call patient's PCP office and speak with [name]. [name] reports that they can give the patient samples of both Livalo and Daliresp. Let Mr. [name] know. Patient reports that he will pick these up today.  Objective:   Encounter Medications:  Outpatient Encounter Medications as of 02/11/2017  Medication Sig  . apixaban (ELIQUIS) 5 MG TABS tablet Take 5 mg by mouth 2 (two) times daily.  Marland Kitchen arformoterol (BROVANA) 15 MCG/2ML NEBU Take 15 mcg by nebulization 2 (two) times daily.  . cholecalciferol (VITAMIN D) 400 units TABS tablet Take 400 Units by mouth daily.  Marland Kitchen diltiazem (CARDIZEM CD) 180 MG 24 hr capsule Take 180 mg by mouth daily.  . flecainide (TAMBOCOR) 50 MG tablet Take 50 mg by mouth 2 (two) times daily.  . furosemide (LASIX) 20 MG tablet Take  20 mg by mouth daily. Take 2 tablets once daily  . gabapentin (NEURONTIN) 600 MG tablet Take 600 mg by mouth 3 (three) times daily.  Marland Kitchen ipratropium-albuterol (DUONEB) 0.5-2.5 (3) MG/3ML SOLN Take 3 mLs by nebulization 2 (two) times daily.  Marland Kitchen lisinopril (PRINIVIL,ZESTRIL) 20 MG tablet Take 20 mg by mouth 2 (two) times daily.  .  magnesium oxide (MAG-OX) 400 MG tablet Take 400 mg by mouth daily.  . Pitavastatin Calcium (LIVALO) 4 MG TABS Take 4 mg by mouth daily.  . roflumilast (DALIRESP) 500 MCG TABS tablet Take 500 mcg by mouth daily.   No facility-administered encounter medications on file as of 02/11/2017.     Assessment:   Drugs sorted by system:  Cardiovascular: Eliquis, diltiazem, flecainide, furosemide, lisinopril, Livalo  Pulmonary/Allergy: Brovana, ipratropium-albuterol, Daliresp  Pain: gabapentin  Vitamins/Minerals: Vitamin D, magnesium oxide    Duplications in therapy: none noted Medications to avoid in the elderly: none noted Drug interactions:  . Gabapentin + magnesium oxide: magnesium oxide may decrease serum concentrations of gabapentin. Note that these are both maintenance medications for the patient, neither medication is new.  Other issues noted: Patient having difficulty with affording his medications.   Plan:  1) Completed extra help application. Patient to give me a call when he receives his response back in the mail.  2) Patient to bring all of his medications with him to his upcoming appointment with PCP on 02/21/17.  3) Will follow as Pharmacy Technician Etter Sjogren assists patient with application for patient assistance for [facility] and [name] from [employer].  4) Patient to pick up samples of Daliresp and Livalo from PCP office today.  [name], PharmD, [place] Management [phone]

## 2017-02-11 NOTE — Patient Outreach (Signed)
Incoming call from [place] with patient's PCP's office. [name] reports that Kyle Farley is Okay with prescribing a Dulera inhaler for the patient, as I could assist that patient with applying for patient assistance through the manufacturer for this medication. Let Joseph Art know that we will go ahead and start assisting the patient with this paperwork.  PLAN  1) Will call to follow up with [name].  2) Will contact Pharmacy Technician Sheral Apley to ask her to assist the patient with completing the patient assistance application for [name] and Dulera through [employer].  [name], PharmD, [place] Management [phone]

## 2017-02-14 DIAGNOSIS — D518 Other vitamin B12 deficiency anemias: Secondary | ICD-10-CM | POA: Diagnosis not present

## 2017-02-15 ENCOUNTER — Other Ambulatory Visit: Payer: Self-pay | Admitting: Pharmacy Technician

## 2017-02-15 NOTE — Patient Outreach (Signed)
[place] [facility]) Care Management  02/15/2017  FAISAL STRADLING [birth year] [id number]   Successful outreach call to [name]. HIPAA identifiers verified. Contacted patient to inform him that I mailed patient portion of [name] application. Requested patient to contact me when he has mailed it back in.  Will follow up in 10 days if I have not received call from patient.  [name] [place], [place] Management [phone]

## 2017-02-21 DIAGNOSIS — H6123 Impacted cerumen, bilateral: Secondary | ICD-10-CM | POA: Diagnosis not present

## 2017-02-21 DIAGNOSIS — I5023 Acute on chronic systolic (congestive) heart failure: Secondary | ICD-10-CM | POA: Diagnosis not present

## 2017-02-21 DIAGNOSIS — M3392 Dermatopolymyositis, unspecified with myopathy: Secondary | ICD-10-CM | POA: Diagnosis not present

## 2017-02-21 DIAGNOSIS — I70213 Atherosclerosis of native arteries of extremities with intermittent claudication, bilateral legs: Secondary | ICD-10-CM | POA: Diagnosis not present

## 2017-02-21 DIAGNOSIS — J449 Chronic obstructive pulmonary disease, unspecified: Secondary | ICD-10-CM | POA: Diagnosis not present

## 2017-02-24 ENCOUNTER — Other Ambulatory Visit: Payer: Self-pay | Admitting: *Deleted

## 2017-02-24 ENCOUNTER — Encounter: Payer: Self-pay | Admitting: *Deleted

## 2017-02-24 NOTE — Patient Outreach (Signed)
[facility]) Care Management  02/24/2017  Kyle Farley [birth year] [id number]   RN Health Coach Monthly Outreach  Referral Date:  01/20/2017 Referral Source:  Episource Screening Reason for Referral:  "[name]" & Disease Management Education Insurance:   [employer]   Outreach Attempt:   Successful telephone outreach to patient for monthly follow up.  HIPAA verified with patient.  Patient reporting increasing shortness of breath, requiring the use of his rescue inhaler at least 3 times a day.  Reports starting on new prescription inhaler, Trelegy Ellipta on Monday at last primary care appointment.  Patient also stating he was having symptoms of allergies, such as flushed and itchy face and itchy eyes.  Stated symptoms had resolved when he was at the physicians office and he was told to continue his allergy medication.  Medications reviewed with patient and no allergy medication listed.  Patient encouraged to call his primary care provider's office and discuss his continued shortness of breath with use of rescue inhaler and to notify the physician of the allergy symptoms and which allergy medication she would suggest.  Patient stated his understanding and stated he would do so.  Patient verbalizes he has all his medications currently and has mailed in to [facility] his medication assistance form.  Denies any falls and reports working out in the gym about 2-3 days a week.  Appointments:  Patient saw Dr. Tobie Farley at scheduled appointment on 02/21/2017 and has follow up appointment on 04/21/2017.  Patient encouraged to keep and attend this appointment.  Again patient encouraged to notify physician of rescue inhaler usage and possible allergy medication suggestion.  Plan: RN Health Coach will make next monthly outreach to patient in the month of March.  [place] Coach [phone] [email or web address]

## 2017-02-25 ENCOUNTER — Other Ambulatory Visit: Payer: Self-pay | Admitting: Pharmacy Technician

## 2017-02-25 NOTE — Patient Outreach (Signed)
[place] [facility]) Care Management  02/25/2017  Kyle Farley September 16, 1940 [id number]   Successful outreach call to [name] in reference to patient portion of [name] application that was mailed back in. Patient informed me of his insurance information that is required for the application.  [name] [place], [place] Management [phone]

## 2017-02-28 ENCOUNTER — Ambulatory Visit: Payer: Self-pay | Admitting: Pharmacy Technician

## 2017-03-07 ENCOUNTER — Other Ambulatory Visit: Payer: Self-pay | Admitting: Pharmacy Technician

## 2017-03-07 NOTE — Patient Outreach (Signed)
[name] [facility]) Care Management  03/07/2017  Kyle Farley 03/04/1940 [id number]   Successful outgoing call to PCP [name] office. Left message for [name] in reference to Provider portion of [name] application being received.  [name] [place], [place] Management [phone]

## 2017-03-14 DIAGNOSIS — D518 Other vitamin B12 deficiency anemias: Secondary | ICD-10-CM | POA: Diagnosis not present

## 2017-03-16 ENCOUNTER — Other Ambulatory Visit: Payer: Self-pay | Admitting: Pharmacy Technician

## 2017-03-16 NOTE — Patient Outreach (Signed)
[place] [facility]) Care Management  03/16/2017  Kyle Farley [birth year] [id number]   Contacted the office of Kyle Farley and requested that nurse print off page 2 of [name] application, have Kyle Farley fill out and sign in the 2 spaces signature is required, and mail back to me.  Kyle Farley, [name] Management [phone]

## 2017-03-21 ENCOUNTER — Other Ambulatory Visit: Payer: Self-pay | Admitting: Pharmacy Technician

## 2017-03-21 NOTE — Patient Outreach (Signed)
[place] [facility]) Care Management  03/21/2017  Kyle Farley Sep 16, 1940 [id number]  Spoke to [name] in [place] office. Kim stated that [name] does have the provider of [name] application and that as soon as she is done filling it out they will mail back into me.   [name], [place] Management [phone]

## 2017-03-24 ENCOUNTER — Ambulatory Visit: Payer: Self-pay | Admitting: *Deleted

## 2017-03-28 ENCOUNTER — Other Ambulatory Visit: Payer: Self-pay | Admitting: Pharmacy Technician

## 2017-03-28 NOTE — Patient Outreach (Signed)
[place] [facility]) Care Management  03/28/2017  Kyle Farley October 10, 1940 [id number]   Received provider portion of [name] application today, will mail out full application to [employer] 50/35.  [name], [place] Management [phone]

## 2017-03-29 ENCOUNTER — Encounter: Payer: Self-pay | Admitting: *Deleted

## 2017-03-29 ENCOUNTER — Other Ambulatory Visit: Payer: Self-pay | Admitting: Pharmacy Technician

## 2017-03-29 ENCOUNTER — Other Ambulatory Visit: Payer: Self-pay | Admitting: *Deleted

## 2017-03-29 NOTE — Patient Outreach (Signed)
[place] [facility]) Care Management  03/29/2017  Kyle Farley 1940/12/09 [id number]   Unsuccessful outreach call #1 to patient in reference to [name] patient assistance application. Left HIPAA compliant voicemail.  Will follow up with patient on Monday the 18th, if call not returned  [name]. [place], [place] Management [phone]

## 2017-03-29 NOTE — Patient Outreach (Signed)
[place] [facility]) Care Management  03/29/2017  Kyle Farley 06-Mar-1940 [id number]   Incoming call from [name], HIPAA identifiers verified. Informed patient that [name] patient assistance application was mailed to [employer] today.   [name] [place], [place] Management [phone]

## 2017-03-29 NOTE — Patient Outreach (Signed)
[place] [facility]) Care Management  03/29/2017  Kyle Farley [birth year] [id number]   RN Health Coach Monthly Outreach  Referral Date:  01/20/2017 Referral Source:  Episource Screening Reason for Referral:  "[name]" & Disease Management Education Insurance:   [employer]   Outreach Attempt:   Successful telephone outreach to patient for monthly follow up.  HIPAA verified with patient.  Patient stating he is doing well.  Reports still requiring his rescue inhaler about 3-4 times a day in addition to his scheduled inhaler and nebulizer's.  Overall, stating his shortness of breath is better he believes.  Reports continued smoking of 10 cigarettes a day.  Patient encouraged to attempt to reduce daily usage down to 8 a day to assist with decreasing shortness of breath.  Patient encouraged to discuss rescue inhaler usage with his primary care provider.  Continues to be active, working out at [employer] about twice a week with continued weight loss per his report.  Appointments:   Rosezena Sensor last appointment was 02/21/2017 with [name] NP and next scheduled appointment is 04/21/2017. Patient encouraged to keep and attend this appointment.  Plan: RN Health Coach will make next monthly outreach to patient in the month of April.  [name] [phone] [name]

## 2017-03-30 ENCOUNTER — Ambulatory Visit: Payer: Self-pay | Admitting: Pharmacy Technician

## 2017-03-30 DIAGNOSIS — D518 Other vitamin B12 deficiency anemias: Secondary | ICD-10-CM | POA: Diagnosis not present

## 2017-04-05 ENCOUNTER — Other Ambulatory Visit: Payer: Self-pay | Admitting: Pharmacy Technician

## 2017-04-05 NOTE — Patient Outreach (Addendum)
[place] [facility]) Care Management  04/05/2017  MEHDI GIRONDA [birth year] [id number]   Contacted [name] patient assistance to verify if they have received application for patients Dulera and [name] that was mailed in on 03/11. Spoke to [place] who stated they had not received it as of yet. Representative stated there is still about a 2 week delay in processing applications.  Will contact [name] 54/62 to see if application has been received.  [name] [place], [place] Management [phone]

## 2017-04-15 ENCOUNTER — Other Ambulatory Visit: Payer: Self-pay | Admitting: Pharmacy Technician

## 2017-04-15 NOTE — Patient Outreach (Signed)
[place] [facility]) Care Management  04/15/2017  Kyle Farley [birth year] [id number]   Successful outreach call to Kyle Farley in reference to Kyle Farley. HIPAA identifiers verified. Informed patient that [name] has mailed attestation letter for him to fill out and sign and requested that he contact me once he has mailed back out to [employer].  Will follow up with patient in about 2 weeks, if patient has not contacted me.  [name] [place], [place] Management [phone]

## 2017-04-21 ENCOUNTER — Other Ambulatory Visit: Payer: Self-pay | Admitting: Pharmacy Technician

## 2017-04-21 DIAGNOSIS — I10 Essential (primary) hypertension: Secondary | ICD-10-CM | POA: Diagnosis not present

## 2017-04-21 DIAGNOSIS — E559 Vitamin D deficiency, unspecified: Secondary | ICD-10-CM | POA: Diagnosis not present

## 2017-04-21 DIAGNOSIS — Z0000 Encounter for general adult medical examination without abnormal findings: Secondary | ICD-10-CM | POA: Diagnosis not present

## 2017-04-21 DIAGNOSIS — E782 Mixed hyperlipidemia: Secondary | ICD-10-CM | POA: Diagnosis not present

## 2017-04-21 DIAGNOSIS — E038 Other specified hypothyroidism: Secondary | ICD-10-CM | POA: Diagnosis not present

## 2017-04-21 DIAGNOSIS — D518 Other vitamin B12 deficiency anemias: Secondary | ICD-10-CM | POA: Diagnosis not present

## 2017-04-21 DIAGNOSIS — E119 Type 2 diabetes mellitus without complications: Secondary | ICD-10-CM | POA: Diagnosis not present

## 2017-04-21 NOTE — Patient Outreach (Signed)
[place] [facility]) Care Management  04/21/2017  Kyle Farley October 24, 1940 [id number]   Successful outreach call to Kyle Farley in reference to [employer] patient assistance attestation letter. HIPAA identifiers verified. [name] stated that he received the letter in the mail today and that he would fill out and mail back to [employer].   Informed patient that I would follow up with [name] in about a week and then contact him with an update  [employer]. [place], [place] Management [phone]

## 2017-04-29 ENCOUNTER — Other Ambulatory Visit: Payer: Self-pay | Admitting: *Deleted

## 2017-04-29 ENCOUNTER — Encounter: Payer: Self-pay | Admitting: *Deleted

## 2017-04-29 NOTE — Patient Outreach (Signed)
[place] [facility]) Care Management  04/29/2017  GREGG WINCHELL Jul 10, 1940 [id number]   RN Health Coach Monthly Outreach  Referral Date:01/20/2017 Referral Source:Episource Screening Reason for Referral:"[name]" & Disease Management Education Insurance:[employer]   Outreach Attempt:  Successful telephone outreach to patient for monthly follow up.  HIPAA verified with patient.  Patient stating he is doing well.  Denies any sick days.  Reports his shortness of breath is about the same, no increased shortness of breath. Continues to use his rescue inhaler about 3-4 times a day.  Patient stating he has received a phone call from his primary care provider changing his inhalers, but he is unsure of the changes and is waiting to pick up the new prescriptions from the pharmacy.  Patient encouraged to speak with the Pharmacist to discuss what the new prescriptions are replacing.  Patient also encouraged to pick up new prescriptions as soon as possible.  Continues to smoke about 1/2 pack cigarettes a day.  Discussed with patient the possibility to decrease smoking to about 1/4 pack per day.    Appointments:  Patient reports he previous primary care provider, Danae Chen NP no longer works for the office he attends and he now seeing another Loss adjuster, chartered.  He does not remember the Physician Assistant's name.  Verbalizes he saw them on 04/21/2017 and scheduled appointment with new provider is 07/20/2017.  Plan:  RN Health Coach will make next monthly telephone outreach to patient in the month of May.  RN Health Coach will verify new primary health care provider to send Quarterly Update to within the next month.  [place] [phone] [place] [email or web address]

## 2017-05-02 ENCOUNTER — Other Ambulatory Visit: Payer: Self-pay | Admitting: Pharmacy Technician

## 2017-05-02 NOTE — Patient Outreach (Signed)
[place] [facility]) Care Management  05/02/2017  OCEAN SCHILDT [birth year] [id number]   Contacted [name] patient assistance in reference to patient attestation letter for [facility] and [name]. Spoke to Mickel Baas who confirmed they had received attestation letter and that patient was approved for both drugs on 04/28/17 until 01/17/18. Mickel Baas also stated that it takes about 3-5 days from approval for the order to be received by [facility].   Will follow up with [facility] 04/17  [name]. [place], [place] Management [phone]

## 2017-05-04 ENCOUNTER — Other Ambulatory Visit: Payer: Self-pay | Admitting: Pharmacy Technician

## 2017-05-04 NOTE — Patient Outreach (Signed)
[place] [facility]) Care Management  05/04/2017  JOSHUE BADAL [birth year] [id number]   Contacted [facility] to check status of patients order for [facility] and [name] inhalers. Alexis informed me they have not received the order from [employer] as of yet.  Will follow up with them again on Friday 04/19  [name]. [place], [place] Management [phone]

## 2017-05-10 ENCOUNTER — Other Ambulatory Visit: Payer: Self-pay | Admitting: Pharmacy Technician

## 2017-05-10 NOTE — Patient Outreach (Signed)
[place] [facility]) Care Management  05/10/2017  Kyle Farley 22-Jan-1940 [id number]   [place] ([name]) to check the order status of patients [name] and Dulera. Spoke to representative who stated that the order was shipped out today and patient should receive it in the next 5-7 business days.  Will inform patient  Kyle Farley. [place], [place] Management [phone]

## 2017-05-10 NOTE — Patient Outreach (Signed)
[place] [facility]) Care Management  05/10/2017  Kyle Farley September 21, 1940 [id number]   Unsuccessful outreach call to patient in reference to the shipment of his medication. Left HIPAA complaint voicemail.  Will follow up with patient in the next 10 days to verify he received his medication order.  [name] [place], [place] Management [phone]

## 2017-05-20 ENCOUNTER — Other Ambulatory Visit: Payer: Self-pay | Admitting: Pharmacy Technician

## 2017-05-20 NOTE — Patient Outreach (Signed)
[place] [facility]) Care Management  05/20/2017  Kyle Farley 05-24-1940 [id number]   Successful outreach call to Kyle Farley, HIPAA identifiers verified. Kyle Farley verified that he received his Dulera and [name] from [facility]. Counseled patient on how to go about getting refills when needed.  Will route note to [facility] for case closure.  [name] [place], [place] Management [phone]

## 2017-05-25 ENCOUNTER — Other Ambulatory Visit: Payer: Self-pay | Admitting: Pharmacist

## 2017-05-25 NOTE — Patient Outreach (Signed)
[place] [facility]) Care Management  05/25/2017  Kyle Farley 24-Apr-1940 [id number]  Receive notification from [place] that [name] verified that he has successfully received his Dulera and [name] from the [name] Patient Assistance Program and that she counseled patient on how to go about getting future refills.  Will close pharmacy episode at this time. Will send InBasket message to [place] to let her know of pharmacy episode closure.  [name], PharmD, [place] Management [phone]

## 2017-06-02 ENCOUNTER — Other Ambulatory Visit: Payer: Self-pay | Admitting: *Deleted

## 2017-06-02 ENCOUNTER — Encounter: Payer: Self-pay | Admitting: *Deleted

## 2017-06-02 NOTE — Patient Outreach (Signed)
[facility]) Care Management  [place]  06/03/2017   Kyle Farley 06-22-1940 [id number]   RN Health Coach Monthly Outreach   Referral Date:  01/20/2017 Referral Source:  Episource Screening Reason for Referral:  "[name]" & Disease Management Education Insurance:   [employer] Team Ad[employer]age   Outreach Attempt:  Successful telephone outreach to patient for monthly follow up.  HIPAA verified with patient.  Patient states he is doing well.  Denies any sick days but does report intermittent chest tightness.  States he does not feel like it is heart pain, endorses a cough that is chronic in nature and has not changed.  Continues to report rescue inhaler use about 2-3 times a day with shortness of breath as usual; denies any increase in shortness of breath.  Patient encouraged to discuss chest tightness with his primary care provider or cardiologist. Reviewed medications with patient.  Patient unsure of change of inhalers and nebulizer medications.  Encouraged patient to verify all medications with his primary care provider and also to take all medications with him to his appointments.    Encounter Medications:  Outpatient Encounter Medications as of 06/02/2017  Medication Sig Note  . albuterol ([name] HFA;VENTOLIN HFA) 108 (90 Base) MCG/ACT inhaler Inhale 2 puffs into the lungs every 6 (six) hours as needed for wheezing or shortness of breath.   Marland Kitchen apixaban (ELIQUIS) 5 MG TABS tablet Take 5 mg by mouth 2 (two) times daily.   . cholecalciferol (VITAMIN D) 400 units TABS tablet Take 400 Units by mouth daily.   Marland Kitchen diltiazem (CARDIZEM CD) 180 MG 24 hr capsule Take 180 mg by mouth daily.   . Fluticasone-Umeclidin-Vilant (TRELEGY ELLIPTA) 100-62.5-25 MCG/INH AEPB Inhale 1 puff into the lungs daily.   . furosemide (LASIX) 20 MG tablet Take 20 mg by mouth daily. Take 2 tablets once daily   . Glycopyrrolate (LONHALA MAGNAIR REFILL KIT) 25 MCG/ML SOLN  Inhale 25 mcg/mL into the lungs 2 (two) times daily.   Marland Kitchen lisinopril (PRINIVIL,ZESTRIL) 20 MG tablet Take 20 mg by mouth 2 (two) times daily.   . mometasone-formoterol (DULERA) 100-5 MCG/ACT AERO Inhale 2 puffs into the lungs 2 (two) times daily.   . Pitavastatin Calcium (LIVALO) 4 MG TABS Take 4 mg by mouth daily.   . roflumilast (DALIRESP) 500 MCG TABS tablet Take 500 mcg by mouth daily.   Marland Kitchen arformoterol (BROVANA) 15 MCG/2ML NEBU Take 15 mcg by nebulization 2 (two) times daily. 06/02/2017: Patient reports not taking anymore  . flecainide (TAMBOCOR) 50 MG tablet Take 50 mg by mouth 2 (two) times daily. 06/02/2017: Patient reports not taking  . gabapentin (NEURONTIN) 600 MG tablet Take 600 mg by mouth 3 (three) times daily. 06/02/2017: Patient reports not taking  . ipratropium-albuterol (DUONEB) 0.5-2.5 (3) MG/3ML SOLN Take 3 mLs by nebulization 2 (two) times daily. 06/02/2017: Patient reports not taking anymore  . magnesium oxide (MAG-OX) 400 MG tablet Take 400 mg by mouth daily. 06/02/2017: Reports taking Calcium 600 mg daily instead of Magnesium   No facility-administered encounter medications on file as of 06/02/2017.     Functional Status:  In your present state of health, do you have any difficulty performing the following activities: 01/24/2017  Hearing? N  Vision? N  Difficulty concentrating or making decisions? N  Walking or climbing stairs? N  Dressing or bathing? N  Doing errands, shopping? N  Preparing Food and eating ? N  Using the Toilet? N  In  the past six months, have you accidently leaked urine? N  Do you have problems with loss of bowel control? N  Managing your Medications? N  Comment difficulties affording medications  Managing your Finances? N  Housekeeping or managing your Housekeeping? N  Some recent data might be hidden    Fall/Depression Screening: Fall Risk  04/29/2017 03/29/2017 02/24/2017  Falls in the past year? Yes (No Data) (No Data)  Comment No falls since  November per patient no more falls since November per patient no falls in the last month  Number falls in past yr: 2 or more - -  Injury with Fall? 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  THN Long Term Goal Start Date  04/29/17  Interventions for Problem One Long Term Goal  Current care plan and goals reviewed and discussed with patient, encouraged patient to keep and attend scheduled medical apointments, encouraged patient to schedule follow up cardiology appointment, reviewed medications with patient and encouraged medication compliance, encouraged patient to take all his medications/inhalers/nebulizers to his medical appointments, discussed and encouraged smoking cessation, congratulated patient on continued weight loss, encouraged continued   [facility] CM Short Term Goal #1   Patient will clarify his inhalers and nebulizer medication with his pcp in the next 30 days.  [facility]N CM Short Term Goal #1 Start Date  06/02/17  [facility] CM Short Term Goal #1 Met Date  06/02/17  Interventions for Short Term Goal #1  Reviewed and discussed medicaitons with patient, encouraged medication compliance, encouraged patient to clarify inhalers and nebulizer medications he is to be taking with pcp, encouraged patient to take all his medications with him to appointments, asked [facility] Pharmacist to review medication list for accuracy  [facility] CM Short Term Goal #2   Patient will schedule Cardiologist follow up appointment in the next 30 days.  [facility]N CM Short Term Goal #2 Start Date  06/03/17  [facility] CM Short  Term Goal #2 Met Date  06/02/17  Interventions for Short Term Goal #2  discussed with patient importance of follow up appointments, encouraged patient to schedule cardiology follow up appointment as soon as possible, encouraged patient to discuss intermittent chest tightness with physician      Appointments:  Patient has new primary care provider, Irven Shelling FNP and last saw him on 04/21/2017 and has scheduled follow up appointment on 07/20/2017.  Patient stating he will go by primary care provider's office next week to clarify his medications.  Verbalizes he needs to schedule an appointment with his cardiologist, Dr. Atilano Median.  Patient encouraged to do so.  Plan: RN Health Coach will make next monthly outreach to patient in the month of June. RN Health Coach will route Quarterly update to primary care provider.  Kyle Farley [phone] [email or web address]

## 2017-06-03 ENCOUNTER — Other Ambulatory Visit: Payer: Self-pay | Admitting: Pharmacist

## 2017-06-03 NOTE — Patient Outreach (Signed)
[place] [facility]) Care Management  06/03/2017  Kyle Farley 1940/08/01 [id number]   Receive a voicemail from [name] returning my phone call. Call patient back, but unable to get through to patient as call will not go through. Call back 20 minutes later and phone rings, but patient does not answer. Leave patient a second HIPAA compliant message on his voicemail. If have not heard from patient by next week, will give him another call at that time.  [name], PharmD, [place] Management [phone]

## 2017-06-03 NOTE — Patient Outreach (Addendum)
[place] [facility]) Care Management  06/03/2017  Kyle Farley Jun 23, 1940 [id number]   Receive an [name] message from [place] requesting that I follow up with patient regarding his medication management. Left a HIPAA compliant message on the patient's voicemail. If have not heard from patient by next week, will give him another call at that time.  [name], PharmD, [place] Management [phone]

## 2017-06-06 ENCOUNTER — Other Ambulatory Visit: Payer: Self-pay | Admitting: Pharmacist

## 2017-06-06 ENCOUNTER — Ambulatory Visit: Payer: Self-pay | Admitting: Pharmacist

## 2017-06-06 NOTE — Patient Outreach (Signed)
[place] [facility]) Care Management  06/06/2017  Kyle Farley Jan 15, 1941 [id number]   Outreach call to Kyle Farley regarding medication management. Outreach attempt #2. Leave patient another HIPAA compliant message on his voicemail. If have not heard from patient by 06/08/17, will give him another call at that time.  [name], PharmD, [place] Management [phone]

## 2017-06-08 ENCOUNTER — Other Ambulatory Visit: Payer: Self-pay | Admitting: Pharmacist

## 2017-06-08 ENCOUNTER — Ambulatory Visit: Payer: Self-pay | Admitting: Pharmacist

## 2017-06-08 NOTE — Addendum Note (Signed)
Addended by: [name] A on: 06/08/2017 12:50 PM   Modules accepted: Orders

## 2017-06-08 NOTE — Patient Outreach (Signed)
[place] [facility]) Care Management  [place]   06/08/2017  LUNDY COZART August 17, 1940 [id number]  Subjective: Receive a call back from Mr. Simington. HIPAA identifiers verified and verbal consent received.  Mr. Henricks reports that he is doing well. Complete a medication review with the patient. Patient reports that he is using the [employer], that he received through [employer] Patient Assistance, twice daily as directed and rinsing mouth after each use. However, patient reports that he is also still using Brovana nebulizer solution twice daily, as he had samples of this medication left over from his previous PCP, Dr. Tobie Poet. Reports that his new PCP, Dr. Iona Beard (also at [facility]), started him at a new inhaler, Trelegy at his last appointment. Note that these three inhalers/nebulizer solutions, Dulera, Trelegy and Brovana, all contain a therapeutically duplicate ingredient, a long-acting beta agonist. Let patient know that I will call his PCP office today to follow up about this therapeutic duplication and request clarification on how patient should proceed.  Patient reports that he is not currently taking flecainide (on Epic medication list). Note that per most recent Cardiology Office Visit note in [facility], from 10/21/16, patient had been instructed to start flecainide 50 mg twice daily at that time and then to have a follow up EKG two weeks later. Patient reports that he does not recall having taken this medication and that he is not currently taking it. Let patient know that I will call to follow up with his Cardiologist about this medication.  Mr. Schnackenberg reports that he is currently not taking magnesium oxide and requests clarification about whether he should be. Reports that he is taking calcium + Vitamin D once daily, but is not sure if he should be and requests clarification about this as well.  Patient reports that his Livalo continues to be difficult for him to  afford. Note that I contacted his previous PCP, Dr. Tobie Poet in January to request that provider consider switching patient from [place] to an alternative statin for cost savings. At that time, Dr. Tobie Poet that he was previously on atorvastatin, simvastatin and rosuvastatin, but unable to tolerate and/or did not have sufficient LDL lowering with these options. Today, Mr. Katzenstein reports that he does not recall having been on these alternatives and expresses interest in trying an alternative for cost savings. Let patient know that I will follow up with his current PCP.  Objective:   Encounter Medications: Outpatient Encounter Medications as of 06/08/2017  Medication Sig Note  . albuterol ([name] HFA;VENTOLIN HFA) 108 (90 Base) MCG/ACT inhaler Inhale 2 puffs into the lungs every 6 (six) hours as needed for wheezing or shortness of breath.   Marland Kitchen apixaban (ELIQUIS) 5 MG TABS tablet Take 5 mg by mouth 2 (two) times daily.   Marland Kitchen arformoterol (BROVANA) 15 MCG/2ML NEBU Take 15 mcg by nebulization 2 (two) times daily.   . Calcium Carbonate-Vitamin D 600-400 MG-UNIT tablet Take 1 tablet by mouth daily.   . cholecalciferol (VITAMIN D) 400 units TABS tablet Take 400 Units by mouth daily.   Marland Kitchen diltiazem (CARDIZEM CD) 180 MG 24 hr capsule Take 180 mg by mouth daily.   . Fluticasone-Umeclidin-Vilant (TRELEGY ELLIPTA) 100-62.5-25 MCG/INH AEPB Inhale 1 puff into the lungs daily.   . furosemide (LASIX) 20 MG tablet Take 20 mg by mouth daily. Take 2 tablets once daily   . gabapentin (NEURONTIN) 600 MG tablet Take 600 mg by mouth 3 (three) times daily.   Marland Kitchen ipratropium-albuterol (DUONEB)  0.5-2.5 (3) MG/3ML SOLN Take 3 mLs by nebulization 2 (two) times daily.   Marland Kitchen lisinopril (PRINIVIL,ZESTRIL) 20 MG tablet Take 20 mg by mouth 2 (two) times daily.   . mometasone-formoterol (DULERA) 200-5 MCG/ACT AERO Inhale 2 puffs into the lungs 2 (two) times daily.   . Pitavastatin Calcium (LIVALO) 4 MG TABS Take 4 mg by mouth daily.   . roflumilast  (DALIRESP) 500 MCG TABS tablet Take 500 mcg by mouth daily.   . [DISCONTINUED] mometasone-formoterol (DULERA) 100-5 MCG/ACT AERO Inhale 2 puffs into the lungs 2 (two) times daily.   . flecainide (TAMBOCOR) 50 MG tablet Take 50 mg by mouth 2 (two) times daily. 06/02/2017: Patient reports not taking  . magnesium oxide (MAG-OX) 400 MG tablet Take 400 mg by mouth daily. Patient reports not taking   No facility-administered encounter medications on file as of 06/08/2017.      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 -Need follow up with patient's PCP about whether he should be taking over the counter magnesium oxide and calcium carbonate  Medication Cost Patient reports difficulty with affording Livalo. Note that in January I spoke with patient's previous PCP, Dr. Tobie Poet, who stated that he was previously on atorvastatin, simvastatin and rosuvastatin, but unable to tolerate and/or did not have sufficient LDL lowering with these options. Patient denies any previous intolerance or issue with taking alternative statins.   PLAN  1) Will call to follow up with patient's PCP regarding patient's inhaler therapeutic duplication, his over the counter supplements and the patient's cost concerns about his Livalo.  2) Will call to follow up with the patient's Cardiologist about whether patient is to currently be taking flecainide.  [name], PharmD, [place] Management [phone]

## 2017-06-08 NOTE — Patient Outreach (Signed)
[place] [facility]) Care Management  06/08/2017  Kyle Farley 06/29/1940 [id number]  Receive a call back Leafy Ro, nurse with Dr. Atilano Median, regarding my message about the patient's most recent Cardiology Office Visit note in [place], from 10/21/16, which stated that patient had been instructed to start flecainide 50 mg twice daily at that time and then to have a follow up EKG two weeks later. Let Leafy Ro know that patient reports that he does not recall having taken this medication and that he is not currently taking it.   Leafy Ro states that the prescription was called in for the patient at the time of that appointment in October, but that the patient called and canceled the follow up EKG appointment and 4 month follow up appointment.   Call and speak with Kyle Farley again. Patient states that he now remembers that he did start the flecainide following this appointment, but that he believes that his previous PCP, Dr. Tobie Poet, instructed him to stop taking it because it was "too strong". Patient also notes that he received a letter from Dr. Gilman Schmidt office about a month ago instructing him to call to schedule a follow up visit. Encourage Kyle Farley to call to schedule this follow up visit today. Patient states that he will.  PLAN  Will also follow up with the Clinical Pharmacist from patient's PCP office regarding the flecainide when she returns my call.  [name], PharmD, [place] Management [phone]

## 2017-06-08 NOTE — Patient Outreach (Signed)
[place] [facility]) Care Management  06/08/2017  Kyle Farley 03-Jun-1940 [id number]   Call to follow back up with [name]. HIPAA identifiers verified and verbal consent received.  Let [name] know that I spoke with [name], pharmacist in Dr. Cleon Dew office, regarding his inhalers, his over the counter supplements, cost concerns about his Livalo and regarding his flecainide. Let patient know that [name] said that she would follow up with him as soon as she heard back from Dr. Iona Beard.  [name] denies any further medication questions/concerns at this time. Confirm that patient has my phone number for future questions.  Will close pharmacy episode at this time.  [name], PharmD, [place] Management [phone]

## 2017-06-08 NOTE — Patient Outreach (Signed)
[place] [facility]) Care Management  06/08/2017  Kyle Farley 1940-10-05 [id number]   Call to follow up with patient's Cardiologist for medication reconciliation. Leave a message with [place] in the office. Note that per most recent Cardiology Office Visit note in [facility], from 10/21/16, patient had been instructed to start flecainide 50 mg twice daily at that time and then to have a follow up EKG two weeks later. Patient reports that he does not recall having taken this medication and that he is not currently taking it. Request a call back.  [name], PharmD, [place] Management [phone]

## 2017-06-08 NOTE — Patient Outreach (Signed)
[name]) Care Management  06/08/2017  Kyle Farley Mar 06, 1940 [id number]   Call to patient's PCP, Dr. Cleon Dew, office for medication reconciliation for the patient's inhalers, supplements and flecainide. Note that patient reports currently using three inhalers/nebulizer solutions, Dulera, Trelegy and Brovana, which all contain a therapeutically duplicate ingredient, a long-acting beta agonist. Note patient also requested clarification about whether he is supposed to currently be taking over the counter magnesium oxide, as he is not currently, and whether he should continue to take a calcium + Vitamin D supplement.   Leave a message with [name] in the office. [name] states that she will have the clinic's Clinical Pharmacist call me back today.  [name], PharmD, [place] Management [phone]

## 2017-06-08 NOTE — Patient Outreach (Signed)
[place] [facility]) Care Management  06/08/2017  JABRIL PURSELL [birth year] [id number]   Outreach call to Mr. Espin regarding medication management. Leave patient another HIPAA compliant message on his voicemail. Will also send patient an outreach letter in attempt to reach him. If have not heard from patient by next week, will give him another call at that time.  [name], PharmD, [place] Management [phone]

## 2017-06-08 NOTE — Patient Outreach (Signed)
Kyle Farley [facility]) Care Management  06/08/2017  [name] [birth year] [id number]   Receive a call back from [facility], pharmacist with [facility]. Kyle Farley is returning my call regarding the that message that I left earlier requesting a call back for medication reconciliation. Let Kyle Farley know that Kyle Farley reports currently using three inhalers/nebulizer solutions, Dulera, Trelegy and Garlon Hatchet, which all contain a therapeutically duplicate ingredient, a long-acting beta agonist. Kyle Farley states that she will follow up with Kyle Farley about whether he would like the patient to continue the [facility] inhaler (that the patient currently has for free through [name] patient assistance) or the Trelegy. Kyle Farley states that she will call to follow up with the patient as soon as she has a response to let him know which to continue and to discontinue the other two. States that if Kyle Farley would like the patient to continue the Trelegy inhaler, she will help the patient to complete the patient assistance program paperwork for this inhaler.  Let Kyle Farley know that the patient reports continuing to have difficulty with affording Livalo, tier 3 through his insurance. Report that in January I spoke with patient's previous PCP, Kyle Farley, who stated that he was previously on atorvastatin, simvastatin and rosuvastatin, but unable to tolerate and/or did not have sufficient LDL lowering with these options. Patient denies any previous intolerance or issue with taking alternative statins. Kyle Farley states that she will follow up with Kyle Farley to ask him to consider switching the patient to either atorvastatin or rosuvastatin for cost savings.  Also let Kyle Farley know that the patient requested clarification about whether he whether he should be taking over the counter magnesium oxide and calcium carbonate.  Finally, let Kyle Farley know that patient's most recent Cardiology Office Visit note in [facility], from 10/21/16, stated that patient had been instructed to start flecainide 50 mg twice daily at that time and then to have a follow up EKG two weeks later. Patient reports  that he did start the flecainide following this appointment, but that he believes that his previous PCP, Kyle Farley, instructed him to stop taking it because it was "too strong". Kyle Farley reports that she does not have any note in the patient's chart about the patient being instructed by Kyle Farley to stop this medication. Reports that she will let Kyle Farley know and then follow up with the patient about this as well. Note that patient has now scheduled at follow up visit with his Cardiologist for 07/18/17.  [name], PharmD, [place] Management [phone]

## 2017-06-15 ENCOUNTER — Ambulatory Visit: Payer: Self-pay | Admitting: Pharmacist

## 2017-06-28 ENCOUNTER — Encounter: Payer: Self-pay | Admitting: *Deleted

## 2017-06-28 ENCOUNTER — Other Ambulatory Visit: Payer: Self-pay | Admitting: *Deleted

## 2017-06-28 NOTE — Patient Outreach (Signed)
[place] [facility]) Care Management  06/28/2017  Kyle Farley [birth year] [id number]   RN Health Coach Monthly Outreach  Referral Date:01/20/2017 Referral Source:Episource Screening Reason for Referral:"[name]" & Disease Management Education Insurance:Health Team Advantage   Outreach Attempt:  Successful telephone outreach to patient for monthly follow up.  HIPAA verified with patient.  Patient stating he is having difficulties sleeping.  Reports he usually has difficulties sleeping, but it is worse now.  Discussed with patient using sleeping aide.  States he has in the past and will discuss with his primary care physician at his next appointment.  Discussed with patient setting nighttime routine to assist with sleeping.  Reports his shortness of breath is about the same, but due to the lack of sleep, he is using his rescue inhaler about 4-5 times a day.  Patient encouraged to speak with his physician concerning his increase in rescue inhaler use.  Patient states he clarified his medications and inhalers/neulizers with the Pharmacist at the physician's office.  Discussed with patient every other month telephone outreaches, patient verbally agrees.  Appointments:  Patient last saw Dr. Iona Beard on 04/21/2017 and has scheduled follow up appointment on 07/20/2017.  States he has Cardiology appointment on 07/18/2017.  Plan: RN Health Coach will make next telephone outreach in the month of August.  Kyle Hopkin RN [place] [phone] [email or web address]

## 2017-07-12 DIAGNOSIS — F17210 Nicotine dependence, cigarettes, uncomplicated: Secondary | ICD-10-CM | POA: Diagnosis not present

## 2017-07-12 DIAGNOSIS — F10231 Alcohol dependence with withdrawal delirium: Secondary | ICD-10-CM | POA: Diagnosis not present

## 2017-07-12 DIAGNOSIS — F419 Anxiety disorder, unspecified: Secondary | ICD-10-CM | POA: Diagnosis not present

## 2017-07-12 DIAGNOSIS — M1990 Unspecified osteoarthritis, unspecified site: Secondary | ICD-10-CM | POA: Diagnosis not present

## 2017-07-12 DIAGNOSIS — I480 Paroxysmal atrial fibrillation: Secondary | ICD-10-CM | POA: Diagnosis not present

## 2017-07-12 DIAGNOSIS — I4891 Unspecified atrial fibrillation: Secondary | ICD-10-CM | POA: Diagnosis not present

## 2017-07-12 DIAGNOSIS — I739 Peripheral vascular disease, unspecified: Secondary | ICD-10-CM | POA: Diagnosis not present

## 2017-07-12 DIAGNOSIS — R262 Difficulty in walking, not elsewhere classified: Secondary | ICD-10-CM | POA: Diagnosis not present

## 2017-07-12 DIAGNOSIS — R002 Palpitations: Secondary | ICD-10-CM | POA: Diagnosis not present

## 2017-07-12 DIAGNOSIS — I5023 Acute on chronic systolic (congestive) heart failure: Secondary | ICD-10-CM | POA: Diagnosis not present

## 2017-07-12 DIAGNOSIS — Z79899 Other long term (current) drug therapy: Secondary | ICD-10-CM | POA: Diagnosis not present

## 2017-07-12 DIAGNOSIS — S20211A Contusion of right front wall of thorax, initial encounter: Secondary | ICD-10-CM | POA: Diagnosis not present

## 2017-07-12 DIAGNOSIS — E7800 Pure hypercholesterolemia, unspecified: Secondary | ICD-10-CM | POA: Diagnosis not present

## 2017-07-12 DIAGNOSIS — J441 Chronic obstructive pulmonary disease with (acute) exacerbation: Secondary | ICD-10-CM | POA: Diagnosis not present

## 2017-07-12 DIAGNOSIS — I10 Essential (primary) hypertension: Secondary | ICD-10-CM | POA: Diagnosis not present

## 2017-07-12 DIAGNOSIS — I70213 Atherosclerosis of native arteries of extremities with intermittent claudication, bilateral legs: Secondary | ICD-10-CM | POA: Diagnosis not present

## 2017-07-12 DIAGNOSIS — I482 Chronic atrial fibrillation: Secondary | ICD-10-CM | POA: Diagnosis not present

## 2017-07-12 DIAGNOSIS — R0602 Shortness of breath: Secondary | ICD-10-CM | POA: Diagnosis not present

## 2017-07-12 DIAGNOSIS — W19XXXA Unspecified fall, initial encounter: Secondary | ICD-10-CM | POA: Diagnosis not present

## 2017-07-12 DIAGNOSIS — Z7902 Long term (current) use of antithrombotics/antiplatelets: Secondary | ICD-10-CM | POA: Diagnosis not present

## 2017-07-12 DIAGNOSIS — I499 Cardiac arrhythmia, unspecified: Secondary | ICD-10-CM | POA: Diagnosis not present

## 2017-07-12 DIAGNOSIS — R531 Weakness: Secondary | ICD-10-CM | POA: Diagnosis not present

## 2017-07-12 DIAGNOSIS — R5383 Other fatigue: Secondary | ICD-10-CM | POA: Diagnosis not present

## 2017-07-13 DIAGNOSIS — I4891 Unspecified atrial fibrillation: Secondary | ICD-10-CM | POA: Diagnosis not present

## 2017-07-13 DIAGNOSIS — J441 Chronic obstructive pulmonary disease with (acute) exacerbation: Secondary | ICD-10-CM | POA: Diagnosis not present

## 2017-07-13 DIAGNOSIS — I482 Chronic atrial fibrillation: Secondary | ICD-10-CM | POA: Diagnosis not present

## 2017-07-18 DIAGNOSIS — I4891 Unspecified atrial fibrillation: Secondary | ICD-10-CM | POA: Diagnosis not present

## 2017-07-19 ENCOUNTER — Other Ambulatory Visit: Payer: Self-pay

## 2017-07-19 DIAGNOSIS — I491 Atrial premature depolarization: Secondary | ICD-10-CM | POA: Diagnosis not present

## 2017-07-19 DIAGNOSIS — D518 Other vitamin B12 deficiency anemias: Secondary | ICD-10-CM | POA: Diagnosis not present

## 2017-07-19 DIAGNOSIS — I480 Paroxysmal atrial fibrillation: Secondary | ICD-10-CM | POA: Diagnosis not present

## 2017-07-19 DIAGNOSIS — I10 Essential (primary) hypertension: Secondary | ICD-10-CM | POA: Diagnosis not present

## 2017-07-19 DIAGNOSIS — I4510 Unspecified right bundle-branch block: Secondary | ICD-10-CM | POA: Diagnosis not present

## 2017-07-19 DIAGNOSIS — R5383 Other fatigue: Secondary | ICD-10-CM | POA: Diagnosis not present

## 2017-07-19 NOTE — Patient Outreach (Signed)
[place] [facility]) Care Management  07/19/2017  Kyle Farley [birth year] [id number]   Telephone Screen Referral Date : 07/18/2017 Referral Source: Discharge Report Referral Reason: Discharge  6/27 Insurance: HTA Outreach attempt # 1To patient.   Telephone call placed to the patient for discharge follow up.  HIPAA verified.  The patient states the he was admitted to the hospital for COPD exacerbation on 6/26-6/27.  He states that he is feeling the best that he has ever felt. He denies any pain, shortness of breath, wheezing or cough.  The patient states that he is adherent with his medications and was given some new prescriptions to take in the hospital.  He states that he has finished his azithromycin 250 mg.  He has folic acid 1 mg daily and a prednisone taper that he is still taking.  He had an appointment with Dr. Iona Beard today at 77 and his cardiologist Dr Daiva Huge office Yesterday.  RN health Coach went over the signs and symptoms of COPD, advised the patient to be observant of his activities in the heat and stay hydrated.  The patient verbalized understanding.    Plan: RN Health Coach Kyle Farley will assess the patient at her next scheduled interval.   Kyle Arms RN, BSN, [place]:  [phone] Fax: [phone]

## 2017-08-03 ENCOUNTER — Other Ambulatory Visit: Payer: Self-pay | Admitting: *Deleted

## 2017-08-03 NOTE — Patient Outreach (Addendum)
[place] [facility]) Care Management  08/03/2017  Kyle Farley 01/05/1941 [id number]   RN Health Coach Monthly Outreach  Referral Date:01/20/2017 Referral Source:Episource Screening Reason for Referral:"[name]" & Disease Management Education Insurance:[employer]   Outreach Attempt:  Outreach attempt #1 to patient for monthly follow up. No answer. RN Health Coach left HIPAA compliant voicemail message along with contact information.  Plan:  RN Health Coach will send unsuccessful outreach letter to patient.  RN Health Coach will make another outreach attempt to patient within 3-4 business days if no return call back from patient.  [place] [phone] [name]

## 2017-08-08 ENCOUNTER — Other Ambulatory Visit: Payer: Self-pay | Admitting: *Deleted

## 2017-08-08 NOTE — Patient Outreach (Signed)
[facility]) Care Management  08/08/2017  Kyle Farley 1940/08/31 [id number]   RN Health Coach Monthly Outreach  Referral Date:01/20/2017 Referral Source:Episource Screening Reason for Referral:"[name]" & Disease Management Education Insurance:[employer]   Outreach Attempt:   Outreach attempt #2 to patient for monthly follow up. No answer. RN Health Coach left HIPAA compliant voicemail message along with contact information.  Plan:  RN Health Coach will make another outreach attempt to patient within 6 business days if no return call back from patient.  [place] Coach [phone] [email or web address]

## 2017-08-16 ENCOUNTER — Ambulatory Visit: Payer: Self-pay | Admitting: *Deleted

## 2017-08-19 ENCOUNTER — Encounter: Payer: Self-pay | Admitting: *Deleted

## 2017-08-19 ENCOUNTER — Other Ambulatory Visit: Payer: Self-pay | Admitting: *Deleted

## 2017-08-19 NOTE — Patient Outreach (Addendum)
Kyle Farley  08/19/2017   Kyle Farley [birth year] [id number]   RN Health Coach Monthly Outreach   Referral Date:  01/20/2017 Referral Source:  Episource Screening Reason for Referral:  "[name]" & Disease Management Education Insurance:   [employer]age    Outreach Attempt:  Successful telephone outreach to patient for monthly follow up.  HIPAA verified with patient.  Patient states he is doing well.  Reports fall in June where he tripped over his dog and landed on the coffee table.  States he was in pain and went to the emergency room, where his heart rate was elevated and he was admitted.  Patient stating he was not told he had broken a rib but feels he may have bruised a rib.  Completed his antibiotics.  Denies any pain now.  Denies any increase in shortness of breath and reports using his rescue inhaler about 2 times a day.  Reports using his nebulizer about every 6 hours.  Discussed with patient smoking cessation; patient stating he does not wish to quite at this time.  Discussed with patient every other month telephone outreaches, patient verbally agrees.  Encounter Medications:  Outpatient Encounter Medications as of 08/19/2017  Medication Sig Note  . albuterol ([name] HFA;VENTOLIN HFA) 108 (90 Base) MCG/ACT inhaler Inhale 2 puffs into the lungs every 6 (six) hours as needed for wheezing or shortness of breath.   Marland Kitchen apixaban (ELIQUIS) 5 MG TABS tablet Take 5 mg by mouth 2 (two) times daily.   . Calcium Carbonate-Vitamin D 600-400 MG-UNIT tablet Take 1 tablet by mouth daily.   . cholecalciferol (VITAMIN D) 400 units TABS tablet Take 400 Units by mouth daily.   Marland Kitchen diltiazem (CARDIZEM CD) 180 MG 24 hr capsule Take 180 mg by mouth daily.   . folic acid (FOLVITE) 1 MG tablet Take 1 mg by mouth daily.   . furosemide (LASIX) 20 MG tablet Take 20 mg by mouth daily. Take 2 tablets once daily   . gabapentin  (NEURONTIN) 600 MG tablet Take 600 mg by mouth 3 (three) times daily.   Marland Kitchen ipratropium-albuterol (DUONEB) 0.5-2.5 (3) MG/3ML SOLN Take 3 mLs by nebulization 2 (two) times daily. 08/19/2017: Reports taking every 6 hours  . lisinopril (PRINIVIL,ZESTRIL) 20 MG tablet Take 20 mg by mouth 2 (two) times daily.   . magnesium oxide (MAG-OX) 400 MG tablet Take 400 mg by mouth daily.   . mometasone-formoterol (DULERA) 200-5 MCG/ACT AERO Inhale 2 puffs into the lungs 2 (two) times daily.   . Pitavastatin Calcium (LIVALO) 4 MG TABS Take 4 mg by mouth daily.   . roflumilast (DALIRESP) 500 MCG TABS tablet Take 500 mcg by mouth daily.   Marland Kitchen thiamine (VITAMIN B-1) 100 MG tablet Take 100 mg by mouth daily.   Marland Kitchen arformoterol (BROVANA) 15 MCG/2ML NEBU Take 15 mcg by nebulization 2 (two) times daily. 06/28/2017: Reports not taking  . flecainide (TAMBOCOR) 50 MG tablet Take 50 mg by mouth 2 (two) times daily. 06/02/2017: Patient reports not taking  . Fluticasone-Umeclidin-Vilant (TRELEGY ELLIPTA) 100-62.5-25 MCG/INH AEPB Inhale 1 puff into the lungs daily. 06/28/2017: Reports not taking   No facility-administered encounter medications on file as of 08/19/2017.     Functional Status:  In your present state of health, do you have any difficulty performing the following activities: 01/24/2017  Hearing? N  Vision? N  Difficulty concentrating or making decisions? N  Walking or climbing  stairs? N  Dressing or bathing? N  Doing errands, shopping? N  Preparing Food and eating ? N  Using the Toilet? N  In the past six months, have you accidently leaked urine? N  Do you have problems with loss of bowel control? N  Managing your Medications? N  Comment difficulties affording medications  Managing your Finances? N  Housekeeping or managing your Housekeeping? N  Some recent data might be hidden    Fall/Depression Screening: Fall Risk  08/19/2017 07/19/2017 04/29/2017  Falls in the past year? Yes Yes Yes  Comment - - No falls since  November per patient  Number falls in past yr: _0 or more  Comment tripped over dog in June 2019 2 weeks ago -  Injury with Fall? 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  THN Long Term Goal Start Date  08/19/17  Interventions for Problem One Long Term Goal  reviewed and discussed current care plan and goals, encouraged to keep and attend medical appointments, reviewed medications and encouraged medication compliance, discussed and encouraged patient to have pharmacy check inhaler for medication, fall precautions and preventions reviewed and discussed  THN CM Short Term Goal #1   Patient will report a decrease in sleep interuption in the next 60 days.  THN CM Short Term Goal #1 Start Date  08/19/17  Interventions for Short Term Goal #1  encouraged patient to discuss sleep habits with primary care provider, encouraged patient to discuss sleep aides with primary care provider, discussed importance of getting uninterrupted sleep, encouraged patient to develop bedtime routine  THN CM Short Term Goal #2   Patient will report a decrease in the use of his rescue inhaler in the next 30 days.  THN CM Short Term Goal #2 Start Date  06/28/17  [facility] Farley CM Short Term Goal #2 Met Date  08/19/17      Appointments:  Patient has seen primary care provider since discharging from  hospital on 07/20/2017 and follow up with Dr. Iona Beard in October 2019.  Seen Cardiologist on 07/19/2017.  Plan: RN Health Coach will make next monthly outreach to patient in the month of October. RN Health Coach will send primary care provider Quarterly Update.  [place] [phone] [email or web address] [email or web address]

## 2017-09-05 ENCOUNTER — Other Ambulatory Visit: Payer: Self-pay | Admitting: Pharmacist

## 2017-09-05 NOTE — Patient Outreach (Signed)
[place] [facility]) Care Management  09/05/2017  [name] 03/07/1940 [id number]   Receive an [name] message from [place] requesting that I reach out to the patient regarding further patient assistance needs. Left a HIPAA compliant message on the patient's voicemail. If have not heard from patient by 09/07/17, will give him another call at that time.  [name], PharmD, [place] Management [phone]

## 2017-09-07 ENCOUNTER — Other Ambulatory Visit: Payer: Self-pay | Admitting: Pharmacist

## 2017-09-07 NOTE — Patient Outreach (Signed)
[place] [facility]) Care Management  09/07/2017  Kyle Farley 05/07/1940 [id number]   Received an [name] message from [place] requesting that I reach out to the patient regarding further patient assistance needs. Outreach attempt #2. Left a HIPAA compliant message on the patient's voicemail. Will also send [name] a letter in attempt to reach him. If have not heard from patient by 09/07/17, will give him another call at that time.  [name], PharmD, [place] Management [phone]

## 2017-09-07 NOTE — Patient Outreach (Signed)
[facility]) Care Management  09/07/2017  KEAGAN BRISLIN 1940/02/04 [id number]   Receive a voicemail back from [employer]. Return patient call. Leave another HIPAA compliant message on the patient's voicemail. If have not heard from patient by 09/09/17, will give him another call at that time.  [name], PharmD, [place] Management [phone]

## 2017-09-09 ENCOUNTER — Other Ambulatory Visit: Payer: Self-pay | Admitting: Pharmacist

## 2017-09-09 NOTE — Patient Outreach (Signed)
[place] [facility]) Care Management  09/09/2017  GARRETT MITCHUM 04-17-1940 [id number]   Call again to follow up with Mr. Haugan regarding medication assistance. HIPAA identifiers verified and verbal consent received.  Medication Assistance Mr. Schreck reports that he is doing well. Reports that the cost of his Eliquis and Daliresp have recently increased by about 50%. Note that patient has partial extra help and discuss how this subsidy works.  Medication Managment Review with Mr. Caggiano his current medication list. Mr. Draheim reports that he is still not taking flecainide. Note that I contacted both his Cardiology office and PCP on 06/08/17 about this medication being on his medication list and mentioned in notes, but the patient not having it. [name], pharmacist in patient's PCP office stated at that time that she and the PCP would coordinate with the patient's Cardiologist to determine whether Mr. Germer should be on the flecainide. Today Mr. Pacitti reports that he was never started on flecainide. However,per note in [place] from Dr. Hamilton Capri from 07/18/17, provider indicates that the patient is currently taking flecainide. Let Mr. Overfield know that I will call today to follow up with Dr. Dion Body office.  Mr. Doggett denies any further medication questions/concerns at this time.    PLAN  1) Let Mr. Dipiero know that I will call to follow up with him as soon as I hear back from his Cardiologist's office.  2) Will follow up with [employer] to confirm that patient's Eliquis and Daliresp have been billed correctly and determine what copayment Mr. Liford can expect in the future.  [name], PharmD, [place] Management [phone]

## 2017-09-09 NOTE — Patient Outreach (Signed)
[place] [facility]) Care Management  09/09/2017  [name] [birth year] [id number]   Speak with Mandi in Dr. Dion Body office. Mandi reviews the patient's chart and states that, despite Dr. Dion Body note from 07/18/17 indicating that the patient was taking flecainide, the patient was never started on this medication as it was never sent into his pharmacy. States that the provider will now call this medication into the patient's pharmacy and that she will call to follow up with the patient to let him know to start the flecainide and to schedule him to come in next week for an EKG.   [name], PharmD, [place] Management [phone]

## 2017-09-09 NOTE — Patient Outreach (Signed)
[place] [facility] [facility]) Care Management  09/09/2017  WILDON CUEVAS 10-13-1940 [id number]   Call to follow up with Mr. Stamper regarding medication management. HIPAA identifiers verified and verbal consent received.  Mr. Milbourne confirms that [facility] called from his Cardiologist's office regarding starting him on the flecainide and that he has an EKG scheduled for next Friday.  PLAN  Let Mr. Timme know that I will call to follow up with him again regarding medication assistance on 09/12/17.  [name], PharmD, [place] Management [phone]

## 2017-09-12 ENCOUNTER — Ambulatory Visit: Payer: Self-pay | Admitting: Pharmacist

## 2017-09-14 ENCOUNTER — Other Ambulatory Visit: Payer: Self-pay | Admitting: Pharmacist

## 2017-09-14 NOTE — Patient Outreach (Signed)
[place] [facility]) Care Management  09/14/2017  [name] 04/06/1940 [id number]   Call to follow up with [name] regarding medication assistance. HIPAA identifiers verified and verbal consent received.  Let [name] know that I followed up with [employer] Pharmacy Technician to confirm that his medications were correctly billed based on his Extra Help Subsidy, 15% of the cost of the medication. [name] understanding. Patient states that he is not interested in applying for patient assistance programs for Eliquis or Daliresp at this time, as he is able to afford the copayments for now. However, patient confirms that he has my phone number and will call if he changes his mind in the future.   [name] denies any further medication questions/concerns at this time. Will close pharmacy episode.  [name], PharmD, [place] Management [phone]

## 2017-09-16 DIAGNOSIS — R079 Chest pain, unspecified: Secondary | ICD-10-CM | POA: Diagnosis not present

## 2017-10-19 DIAGNOSIS — E782 Mixed hyperlipidemia: Secondary | ICD-10-CM | POA: Diagnosis not present

## 2017-10-19 DIAGNOSIS — D518 Other vitamin B12 deficiency anemias: Secondary | ICD-10-CM | POA: Diagnosis not present

## 2017-10-19 DIAGNOSIS — I10 Essential (primary) hypertension: Secondary | ICD-10-CM | POA: Diagnosis not present

## 2017-10-19 DIAGNOSIS — Z23 Encounter for immunization: Secondary | ICD-10-CM | POA: Diagnosis not present

## 2017-10-19 DIAGNOSIS — E038 Other specified hypothyroidism: Secondary | ICD-10-CM | POA: Diagnosis not present

## 2017-10-19 DIAGNOSIS — E119 Type 2 diabetes mellitus without complications: Secondary | ICD-10-CM | POA: Diagnosis not present

## 2017-10-19 DIAGNOSIS — E559 Vitamin D deficiency, unspecified: Secondary | ICD-10-CM | POA: Diagnosis not present

## 2017-10-21 ENCOUNTER — Encounter: Payer: Self-pay | Admitting: *Deleted

## 2017-10-21 ENCOUNTER — Other Ambulatory Visit: Payer: Self-pay | Admitting: *Deleted

## 2017-10-21 NOTE — Patient Outreach (Signed)
[place] [facility]) Care Management  10/21/2017  Kyle Farley 1940/09/01 [id number]   RN Health Coach Monthly Outreach  Referral Date:01/20/2017 Referral Source:Episource Screening Reason for Referral:"[name]" & Disease Management Education Insurance:[employer]   Outreach Attempt:  Successful telephone outreach to patient for quarterly follow up.  HIPAA verified with patient.  Patient stating he is doing well.  Denies any increase in shortness of breath and reports not having to use his rescue inhaler in the last week.  States he has done well with using his nebulizer every 6 hours.  Patient is stating he is having trouble calling his refill prescriptions of Dulera and [name] into [employer].  RN Health Coach attempted to assist him, but conference call did not work.  Attempted to call and speak with [place] and because I did not have MPI number from provider office, could not do it without patient being on call.  Encouraged patient to contact [place] and press option 3 to speak with representative as it seemed automatic system was not working.  Appointments:  Patient states he saw Dr. Iona Beard, primary care provider on 10/19/2017 and will follow up in 3 months (still needs to schedule this appointment).  Plan: RN Health Coach will make next telephone outreach to patient in the month of December.  [place] [phone] [email or web address] [email or web address]

## 2017-12-27 ENCOUNTER — Encounter: Payer: Self-pay | Admitting: *Deleted

## 2017-12-27 ENCOUNTER — Other Ambulatory Visit: Payer: Self-pay | Admitting: *Deleted
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
## 2017-12-30 ENCOUNTER — Other Ambulatory Visit: Payer: Self-pay | Admitting: Pharmacist

## 2017-12-30 ENCOUNTER — Other Ambulatory Visit: Payer: Self-pay | Admitting: Pharmacy Technician

## 2017-12-30 NOTE — Patient Outreach (Addendum)
[place] [facility]) Care Management  12/30/2017  JOEZIAH VOIT May 13, 1940 [id number]   Outreach to Mr. Haile regarding medication assistance for [facility] and [name]. [place] assisted patient with application for these inhalers from [name] patient assistance program for the 2019 calendar year. Call to verify whether patient is still taking this medication and, if so, to assist with application for 6789 calendar year. Patient does not answer. Leave a HIPAA compliant message on the patient's voicemail.   [name], PharmD, [place] Management [phone]

## 2017-12-30 NOTE — Patient Outreach (Signed)
[place] [facility]) Care Management  12/30/2017  Kyle Farley 1940-05-17 [id number]   Received 2020 patient assistance referral from [place] for [facility] and [name] through [employer].  Prepared patient and provider portions to be mailed.  Will followup in 7-10 business days to confirm receipt of application with patient.  [name] P. Del Over[place], [place] Management [phone]

## 2017-12-30 NOTE — Patient Outreach (Signed)
[place] [facility]) Care Management  12/30/2017  Kyle Farley [birth year] [id number]   Receive a call back from Kyle Farley. HIPAA identifiers verified and verbal consent received.  Kyle Farley confirms that he is continuing to use his [name] rescue inhaler as needed and his Dulera inhaler twice daily as directed. Reports that he reordered refills of both from the assistance program about a month ago and "has plenty left". Confirms that Kyle Farley is his prescriber for these medications.   Patient Assistance Programs: 1) [name] made by [employer] o Income requirement met: X Yes  o Out-of-pocket prescription expenditure met:     X Not applicable        2)  Dulera made by [employer] o Income requirement met: X Yes  o Out-of-pocket prescription expenditure met:   X Not applicable   Plan: I will route patient assistance letter to [place] technician who will coordinate patient assistance program application process for medications listed above.  [facility] pharmacy technician will assist with obtaining all required documents from both patient and provider and submit application once completed.    [name], PharmD, [place] Management [phone]

## 2018-01-09 ENCOUNTER — Other Ambulatory Visit: Payer: Self-pay | Admitting: Pharmacy Technician

## 2018-01-09 NOTE — Patient Outreach (Signed)
[place] [facility]) Care Management  01/09/2018  Kyle Farley [birth year] [id number]   Unsuccessful outreach call placed to patient in regards to [name] patient assistance for [name] and [name].  Unfortunately patient did not answer the phone so could inquire if he had received his patient assistance applications.HIPPA compliant voicemail left.  Will followup in 3-5 business days with 2nd attempt if call is not returned.  Bradley Bostelman P. Donnita Farina, [place] Management [phone]

## 2018-01-09 NOTE — Patient Outreach (Signed)
[place] [facility]) Care Management  01/09/2018  Kyle Farley 1940/06/10 [id number]  Incoming call received from patient in regards to his [name] patient assistance applications for [employer] & [name].  Spoke to [name], HIPPA identifiers verified. Patient informed me that he had mailed back his applications on Saturday. Informed Mr, [name] that I would be in touch with the patient in the [place] Year in regards to his applications.  Will followup in 10-14 business days with patient in applications have not been received.  [name] P. Zalan Shidler, [place] Management [phone]

## 2018-01-19 ENCOUNTER — Other Ambulatory Visit: Payer: Self-pay | Admitting: Pharmacy Technician

## 2018-01-19 NOTE — Patient Outreach (Signed)
[place] [facility]) Care Management  01/19/2018  Kyle Farley [birth year] [id number]    Care coordination call placed to [facility] Internal Medicine, Irven Shelling, [place] office in regards to [name] patient assistance application.  Spoke to nurse/receptionist to inquire if they had received the provider portion of the application that was mailed on 01/02/18.  Was informed that there was no [name] documents associated with his account.  Informed nurse/receptionist that it may take a few extra days to show up due to the holidays and our mail having to go to the hospital but that in the meantime another application would be mailed. Confirmed mailing address.  Will followup with provider office in 10-14 business days if application has not been returned. [name] P. Mirca Yale, [place] Management [phone]

## 2018-01-23 DIAGNOSIS — D518 Other vitamin B12 deficiency anemias: Secondary | ICD-10-CM | POA: Diagnosis not present

## 2018-01-23 DIAGNOSIS — E559 Vitamin D deficiency, unspecified: Secondary | ICD-10-CM | POA: Diagnosis not present

## 2018-01-23 DIAGNOSIS — E038 Other specified hypothyroidism: Secondary | ICD-10-CM | POA: Diagnosis not present

## 2018-01-23 DIAGNOSIS — E119 Type 2 diabetes mellitus without complications: Secondary | ICD-10-CM | POA: Diagnosis not present

## 2018-01-23 DIAGNOSIS — E782 Mixed hyperlipidemia: Secondary | ICD-10-CM | POA: Diagnosis not present

## 2018-01-23 DIAGNOSIS — I480 Paroxysmal atrial fibrillation: Secondary | ICD-10-CM | POA: Diagnosis not present

## 2018-01-23 DIAGNOSIS — I10 Essential (primary) hypertension: Secondary | ICD-10-CM | POA: Diagnosis not present

## 2018-01-30 DIAGNOSIS — I480 Paroxysmal atrial fibrillation: Secondary | ICD-10-CM | POA: Diagnosis not present

## 2018-01-31 ENCOUNTER — Other Ambulatory Visit: Payer: Self-pay | Admitting: Pharmacy Technician

## 2018-01-31 NOTE — Patient Outreach (Signed)
[place] [facility]) Care Management  01/31/2018  Kyle Farley 1940-02-16 [id number]    Care coordination call placed to [facility] Internal Medicine, Irven Shelling FNP's office, in regards to [name] patient assistance for [name] and [name].  Unfortunately the nurse could not come to the phone so a detailed voicemail was left on the nurse's (renee) line for a return call.  Will followup with provider in 3-5 business days if call is not returned.  Abrianna Sidman P. Ermine Spofford, [place] Management [phone]

## 2018-02-01 ENCOUNTER — Other Ambulatory Visit: Payer: Self-pay | Admitting: Pharmacy Technician

## 2018-02-01 NOTE — Patient Outreach (Signed)
[place] [facility]) Care Management  02/01/2018  Kyle Farley [birth year] [id number]    Successful outgoing call placed to patient in regards to his [name] application for [facility] and [name].  Spoke to Kyle Farley, HIPAA identifiers verified. Informed Kyle Farley that I had finally received his doctor's portion of the application in the mail and would be mailing both parts of the application in the morning. Informed Kyle Farley that he would be receiving an attestation letter in the mail in about a month and asked that he call me when he gets the letter so that we could discuss it together. Patient verbalized understanding and informed me that he had to do that last year as well.  Will followup with [name] in 10-14 business days to confirm receipt of application and inquire when the attestation letter was mailed.  Takayla Baillie P. Louvinia Cumbo, [place] Management [phone]

## 2018-02-01 NOTE — Patient Outreach (Signed)
[place] [facility]) Care Management  02/01/2018  Kyle Farley March 30, 1940 [id number]  ADDENDUM  Received all necessary documents and signatures from both patient and provider for [name] patient assistance application for [facility] and [name].  Submitted completed application via mail.   Will followup in 10-14 business days with merck to confirm receipt of application and mailing out of attestation letter.  [name] P. [name], [place] Management [phone]

## 2018-02-24 ENCOUNTER — Other Ambulatory Visit: Payer: Self-pay | Admitting: Pharmacy Technician

## 2018-02-24 NOTE — Patient Outreach (Signed)
[place] [facility]) Care Management  02/24/2018  Kyle Farley July 29, 1940 [id number]   Care coordination call placed to [name] patient assistance in regards to patient's application for [facility] and [name].  Spoke to [employer] who said they had not received the application at this time. She said the receiving department has been experiencing some delays due to the influx of applications coming in. She suggested to call back in 5-7 business days to see if the data entry team has loaded the application into the system.  Will followup in 7-10 business days to inquire on status of application.  [name] P. [name], [place] Management [phone]

## 2018-03-01 ENCOUNTER — Other Ambulatory Visit: Payer: Self-pay | Admitting: Pharmacy Technician

## 2018-03-01 NOTE — Patient Outreach (Signed)
[place] [facility]) Care Management  03/01/2018  JAHSIR RAMA [birth year] [id number]  Care coordination call placed to merck patient assistance program in regards to patient's application for [facility] and [name].  Spoke to [place] who said the application had been received and the attestation letter was mailed out on 02/25/2018.  Will followup with patient today to remind him that the attestation letter is coming.  Lechelle Wrigley P. Yanett Conkright, [place] Management [phone]

## 2018-03-01 NOTE — Patient Outreach (Signed)
[place] [facility]) Care Management  03/01/2018  Kyle Farley Aug 10, 1940 [id number]  ADDENDUM  Unsuccessful outreach call placed to patient in regards to merck patient assistance for [place] and [name].  Unfortuantely patient did not answer the phone. Was calling to inform patient that the attestation letter was mailed on 02/25/2018 and to remind him to call me when he has received the letter so that we could go over it together.  Will followup with patient in 5-7 business days to confirm receipt of attestation letter.  Aily Tzeng P. Braiden Rodman, [place] Management [phone]

## 2018-03-01 NOTE — Patient Outreach (Signed)
[place] [facility]) Care Management  03/01/2018  KENDARIUS VIGEN [birth year] [id number]    ADDENDUM  Incoming call received from patient, HIPAA identifiers verified.  Patient was returning my call. Informed Mr. Lothrop to be on the lookout for the [name] attestation letter in the mail. Informed him that the letter was mailed out to him on 02/24/2018. Informed patient to call me when he receives it and we could go over it together. Patient verbalized understanding.   Will followup with patient in 7-10 business days if he has not returned the call stating the attestation letter had arrived.  Sallye Lunz P. Eymi Lipuma, [place] Management [phone]

## 2018-03-03 ENCOUNTER — Other Ambulatory Visit: Payer: Self-pay | Admitting: Pharmacy Technician

## 2018-03-03 NOTE — Patient Outreach (Signed)
[place] [facility]) Care Management  03/03/2018  CORKY BLUMSTEIN Jan 30, 1940 [id number]    Incoming call received from patient, HIPAA identifiers verified.  Patient was calling to inform me that he had received his attestation letter from [employer]. We went over the form together. Patient informed that he would place the completed form along with all other documents that were sent in the envelope that [name] provided. Informed patient I would followup with the compnay in 10-14 business days.  Grete Bosko P. Baya Lentz, [place] Management [phone]

## 2018-03-10 ENCOUNTER — Other Ambulatory Visit: Payer: Self-pay | Admitting: Pharmacist

## 2018-03-10 NOTE — Patient Outreach (Signed)
[place] [facility]) Care Management  [place] - Medication Adherence   03/10/2018  Kyle Farley January 13, 1941 [id number]  [facility] Medication: lisinopril 5 mg Date & Supply of last refill: 02/27/2018, 90 day supply Current insurance:[employer] Team Advantage   Outreach:  Incoming call from Kyle Farley in response to the [facility] Medication Adherence Campaign. Speak with patient. HIPAA identifiers verified.  Subjective:  Kyle Farley reports that he has had a productive cough for the past week. Review COPD medications with patient and he reports using his Dulera inhaler, ipratropium-albuterol nebulizer solution and Daliresp each as directed. Denies increase in use of his albuterol inhaler. Reports that he has been using is a couple of times each day. However, reports having increased difficulty with breathing when he starts coughing. Encourage patient to keep this inhaler with him. Also advise patient to call now to his PCP to let PCP know about his current symptoms and discuss how to manage his COPD while he has this productive cough. Kyle Farley states that he will call now. Patient confirms that he has the phone number for the 24 hour nurse advise line.  Kyle Farley reports that he last had his lisinopril 5 mg refilled on 02/27/2018. Reports taking it once daily as directed.  Patient lets me know that he discussed his anticoagulation therapy and issues with bruising/bleeding with his PCP and that his PCP discontinued his Eliquis and instead had him start taking aspirin 81 mg once daily.   Kyle Farley denies any further medication questions/concerns at this time.   Objective: No results found for: CREATININE  No results found for: HGBA1C  Lipid Panel  No results found for: CHOL, TRIG, HDL, CHOLHDL, VLDL, LDLCALC, LDLDIRECT  BP Readings from Last 3 Encounters:  No data found for BP    No Known Allergies   Assessment:  . Patient with COPD and currently having a productive  cough - advised to follow up with PCP   Plan: 1) Patient to call to follow up with PCP regarding productive cough.  2) Will continue to follow with [facility] CPh[facility] as she assists patient with patient assistance application process.   [name], PharmD, [place] Management [phone]

## 2018-03-13 ENCOUNTER — Other Ambulatory Visit: Payer: Self-pay | Admitting: Pharmacy Technician

## 2018-03-13 NOTE — Patient Outreach (Signed)
[place] [facility]) Care Management  03/13/2018  Kyle Farley [birth year] [id number]    Care coordination call placed to [name] patient assistance in regards to patient's application for [facility] and [name].  Spoke to [place] who said patient had been APPROVED 03/13/2018-01/18/2019. Per Adonis Huguenin patient is set to have delivered in the next 10-14 business days 3 inhalers of each kind. Adonis Huguenin said it normally takes 2-3 business days for pharmacy to review the rx and another 7-10 business days to arrive at the patient's home.  Will followup with patient in 10-14 business days to inquire if he has received the medication.  Kyle Farley P. Giani Betzold, [place] Management [phone]

## 2018-03-28 ENCOUNTER — Encounter: Payer: Self-pay | Admitting: *Deleted

## 2018-03-28 ENCOUNTER — Other Ambulatory Visit: Payer: Self-pay | Admitting: *Deleted

## 2018-03-28 NOTE — Patient Outreach (Signed)
[place] [facility]) Care Management  [facility] Care Manager  03/28/2018   Kyle Farley 29-Mar-1940 [id number]   [place] Quarterly Outreach   Referral Date:  01/20/2017 Referral Source:  Episource Screening Reason for Referral:  "[name]" & Disease Management Education Insurance:   Health Team Advantage   Outreach Attempt:  Successful telephone outreach to patient for quarterly update.  HIPAA verified with patient.  Patient reporting he is doing well.  Does endorse recent cough that continues, but is better after round of antibiotics.  States he completed the course of antibiotics last week.  Encouraged patient to contact provider if cough continues or gets any worse.  Continues to report walking the dog around the neighborhood about 3-4 times a day.  States shortness of breath is about the same and using his rescue inhaler about 3-4 times a day depending on how active he is.  Discussed and encouraged smoking cessation.  Encounter Medications:  Outpatient Encounter Medications as of 03/28/2018  Medication Sig Note  . acetaminophen (ACETAMINOPHEN 8 HOUR) 650 MG CR tablet Take 1,300 mg by mouth 2 (two) times daily as needed for pain.   Marland Kitchen albuterol ([name] HFA;VENTOLIN HFA) 108 (90 Base) MCG/ACT inhaler Inhale 2 puffs into the lungs every 6 (six) hours as needed for wheezing or shortness of breath.   Marland Kitchen aspirin EC 81 MG tablet Take 81 mg by mouth daily.   . Calcium Carbonate-Vitamin D 600-400 MG-UNIT tablet Take 1 tablet by mouth daily.   . cholecalciferol (VITAMIN D) 400 units TABS tablet Take 400 Units by mouth daily.   Marland Kitchen diltiazem (CARDIZEM CD) 180 MG 24 hr capsule Take 180 mg by mouth daily.   . flecainide (TAMBOCOR) 50 MG tablet Take 50 mg by mouth 2 (two) times daily.   . folic acid (FOLVITE) 1 MG tablet Take 1 mg by mouth daily.   . furosemide (LASIX) 20 MG tablet Take 20 mg by mouth daily. Take 2 tablets once daily   . gabapentin (NEURONTIN)  600 MG tablet Take 600 mg by mouth 3 (three) times daily.   Marland Kitchen ipratropium-albuterol (DUONEB) 0.5-2.5 (3) MG/3ML SOLN Take 3 mLs by nebulization 2 (two) times daily. 08/19/2017: Reports taking every 6 hours  . lisinopril (PRINIVIL,ZESTRIL) 5 MG tablet Take 5 mg by mouth daily.   . magnesium oxide (MAG-OX) 400 MG tablet Take 400 mg by mouth daily.   . mometasone-formoterol (DULERA) 200-5 MCG/ACT AERO Inhale 2 puffs into the lungs 2 (two) times daily.   . roflumilast (DALIRESP) 500 MCG TABS tablet Take 500 mcg by mouth daily.   Marland Kitchen thiamine (VITAMIN B-1) 100 MG tablet Take 100 mg by mouth daily.   . rosuvastatin (CRESTOR) 5 MG tablet Take 5 mg by mouth daily. 12/27/2017: Reports not taking   No facility-administered encounter medications on file as of 03/28/2018.     Functional Status:  In your present state of health, do you have any difficulty performing the following activities: 12/27/2017  Hearing? N  Vision? N  Difficulty concentrating or making decisions? N  Walking or climbing stairs? N  Dressing or bathing? N  Doing errands, shopping? N  Preparing Food and eating ? N  Using the Toilet? N  Do you have problems with loss of bowel control? N  Managing your Medications? Y  Managing your Finances? N  Housekeeping or managing your Housekeeping? N  Some recent data might be hidden    Fall/Depression Screening: Fall Risk  03/28/2018  12/27/2017 10/21/2017  Falls in the past year? 1 1 Yes  Comment last fall June 2019 no falls since June 2019 no falls since June 2019  Number falls in past yr: '1 1 1  ' Comment - - -  Injury with Fall? 0 0 No  Comment - - -  Risk for fall due to : Impaired balance/gait;Medication side effect;Impaired mobility Medication side effect;Impaired balance/gait;Impaired mobility Medication side effect;History of fall(s)  Follow up Falls evaluation completed;Falls prevention discussed;Education provided Falls prevention discussed;Education provided Falls prevention  discussed;Education provided   [facility] 2/9 Scores 12/27/2017 01/24/2017 01/20/2017  PHQ - 2 Score 0 0 0   [facility] CM Care Plan Problem One     Most Recent Value  Care Plan Problem One  Knowledge deficiet related to self care with COPD  Role Documenting the Problem One  [facility] [place] for Problem One  Active  [facility] Long Term Goal   Patient will report no emergency room visits or hospitalizations within the next 90 days.  [facility] Long Term Goal Start Date  03/28/18  Interventions for Problem One Long Term Goal  Encouraged patient to contact physician if cough gets worse, confirmed with patient he completed recent prescribed course of antibiotics, encouraged to keep and attend scheduled medical appointments, reviewed medications and encouraged medication compliance, reviewed and discussed current care plan and goals, discussed and encouraged smoking cessation or decreasing number of cigarrettes a day (patient stating he does not wish to quite smoking at this time), discussed dangers of continued smoking  [facility] CM Short Term Goal #1   Patient will report 2 pound weight loss in the next 90 days (183 pounds now).  [facility] CM Short Term Goal #1 Start Date  03/28/18  [facility] CM Short Term Goal #1 Met Date  03/28/18  Interventions for Short Term Goal #1  Congratulated on continued weight loss, encourged patient to continue with daily walks throughut the day, encouraged to continue to exercise as breathing allows,      Appointments:  Patient reports seeing Dr. Iona Beard the beginning of February 2020 and next scheduled appointment is April 2020.  Plan: RN Health Coach will send primary care provider quarterly update. RN Health Coach will make next telephone outreach to patient within the month of June.  [place] [phone] [email or web address]

## 2018-03-31 ENCOUNTER — Other Ambulatory Visit: Payer: Self-pay | Admitting: Pharmacy Technician

## 2018-03-31 NOTE — Patient Outreach (Signed)
[place] [facility]) Care Management  03/31/2018  MELBOURNE JAKUBIAK 03-09-1940 [id number]   ADDENDUM  Incoming call received from patient in regards to [name] application for [facility] and [name].  Spoke to patient, HPAA identifiers verified. Patient was calling to inform me that he had received 3 month supply of his  medication in the mail today. Inquired if patient had any questions concerning this medication. Patient informed that he did not have any questions or concerns at this time. Discussed with patient how to obtain his refills by calling [facility] pharmacy. Informed patient to call when he opens his last inhaler to allow for delivery time. Patient verbalized understanding. Confirmed patient had my name and number for future reference as patient should not have to pay out of pocket for this medication for the remainder of the year.  Will remove myself from care team and route [facility] RPh [name] a note for case closure as patient assistance has been completed.  Almir Botts P. Justice Milliron, [place] Management [phone]

## 2018-03-31 NOTE — Patient Outreach (Signed)
[place] [facility]) Care Management  03/31/2018  ROHIT DELORIA 01/23/1940 [id number]    Successful outreach call placed to patient in regards to [name] application for [facility] and [name].  Spoke to patient, HIPAA identifiers verified. Informed patient that he had been approved for the program and was inquiring if had received a shipment of medication. Patient informed that he had not received a shipment yet. Informed patient that he was approved on the 24th of Febraruy and that it taakes 2-3 days for the pharacy to receive the request, another 7-10 for the pharmacy to process and get the shipment ready for delivery, and another 7-10 for the medication to reach the patient using the USPS. Informed patient to be on the lookout for the package. Patient verbalized understanding.   Will followup with patient in 10-14 business days to confirm receipt of medication.  Keyshun Elpers P. Kloey Cazarez, [place] Management [phone]

## 2018-04-03 ENCOUNTER — Other Ambulatory Visit: Payer: Self-pay | Admitting: Pharmacist

## 2018-04-03 NOTE — Patient Outreach (Signed)
[place] [facility]) Care Management  04/03/2018  Kyle Farley [birth year] [id number]  Receive an [name] message from [place] letting me know that [name]' [name] patient assistance application for [employer] and [name] was approved, he has received 3 month supply of his medication in the mail, he did not have any questions or concerns at this time and that she discussed with patient how to obtain his refills by calling [employer].    PLAN  1) Will close pharmacy episode at this time.  2) Will send InBasket message to [place] to let her know of Pharmacy episode closure.  [name], PharmD, [place] Management [phone]

## 2018-04-06 DIAGNOSIS — R062 Wheezing: Secondary | ICD-10-CM | POA: Diagnosis not present

## 2018-04-06 DIAGNOSIS — R509 Fever, unspecified: Secondary | ICD-10-CM | POA: Diagnosis not present

## 2018-04-06 DIAGNOSIS — J069 Acute upper respiratory infection, unspecified: Secondary | ICD-10-CM | POA: Diagnosis not present

## 2018-04-06 DIAGNOSIS — R0600 Dyspnea, unspecified: Secondary | ICD-10-CM | POA: Diagnosis not present

## 2018-04-06 DIAGNOSIS — R05 Cough: Secondary | ICD-10-CM | POA: Diagnosis not present

## 2018-05-22 DIAGNOSIS — E559 Vitamin D deficiency, unspecified: Secondary | ICD-10-CM | POA: Diagnosis not present

## 2018-05-22 DIAGNOSIS — E038 Other specified hypothyroidism: Secondary | ICD-10-CM | POA: Diagnosis not present

## 2018-05-22 DIAGNOSIS — I10 Essential (primary) hypertension: Secondary | ICD-10-CM | POA: Diagnosis not present

## 2018-05-22 DIAGNOSIS — E119 Type 2 diabetes mellitus without complications: Secondary | ICD-10-CM | POA: Diagnosis not present

## 2018-05-22 DIAGNOSIS — R05 Cough: Secondary | ICD-10-CM | POA: Diagnosis not present

## 2018-05-22 DIAGNOSIS — R0989 Other specified symptoms and signs involving the circulatory and respiratory systems: Secondary | ICD-10-CM | POA: Diagnosis not present

## 2018-05-22 DIAGNOSIS — D518 Other vitamin B12 deficiency anemias: Secondary | ICD-10-CM | POA: Diagnosis not present

## 2018-05-22 DIAGNOSIS — E782 Mixed hyperlipidemia: Secondary | ICD-10-CM | POA: Diagnosis not present

## 2018-05-22 DIAGNOSIS — R011 Cardiac murmur, unspecified: Secondary | ICD-10-CM | POA: Diagnosis not present

## 2018-05-22 DIAGNOSIS — J449 Chronic obstructive pulmonary disease, unspecified: Secondary | ICD-10-CM | POA: Diagnosis not present

## 2018-05-22 DIAGNOSIS — R590 Localized enlarged lymph nodes: Secondary | ICD-10-CM | POA: Diagnosis not present

## 2018-05-24 ENCOUNTER — Other Ambulatory Visit: Payer: Self-pay | Admitting: *Deleted

## 2018-05-24 DIAGNOSIS — D492 Neoplasm of unspecified behavior of bone, soft tissue, and skin: Secondary | ICD-10-CM | POA: Diagnosis not present

## 2018-05-24 DIAGNOSIS — I714 Abdominal aortic aneurysm, without rupture: Secondary | ICD-10-CM | POA: Diagnosis not present

## 2018-05-24 DIAGNOSIS — R221 Localized swelling, mass and lump, neck: Secondary | ICD-10-CM | POA: Diagnosis not present

## 2018-05-24 NOTE — Patient Outreach (Signed)
[place] [facility]) Care Management  05/24/2018  Kyle Farley May 04, 1940 [id number]   RN Health Coach Case Closure  Referral Date:01/20/2017 Referral Source:Episource Screening Reason for Referral:"[name]" & Disease Management Education Insurance:[employer] Team Advantage   Outreach Attempt:  Successful telephone outreach to patient for confirmation of engagement with [facility].  HIPAA verified with patient.  Patient confirms he is now engaged with [facility].  Discussed inability to continues services with [facility] due to engagement with another Care Management provider and thanked patient for his participation with [facility].  Patient stated his understanding.  Confirms he has obtained his inhalers from the patient assistance program.  Plan: [place] will close case and make inactive with [facility]. RN Health Coach will send primary care provider CM Case Closure Letter. RN Health Coach will send patient CM Case Closure Letter.   [place] [phone] [email or web address]

## 2018-05-25 DIAGNOSIS — I480 Paroxysmal atrial fibrillation: Secondary | ICD-10-CM | POA: Diagnosis not present

## 2018-06-22 ENCOUNTER — Ambulatory Visit: Payer: PPO | Admitting: *Deleted

## 2018-09-05 DIAGNOSIS — E038 Other specified hypothyroidism: Secondary | ICD-10-CM | POA: Diagnosis not present

## 2018-09-05 DIAGNOSIS — E782 Mixed hyperlipidemia: Secondary | ICD-10-CM | POA: Diagnosis not present

## 2018-09-05 DIAGNOSIS — Z0000 Encounter for general adult medical examination without abnormal findings: Secondary | ICD-10-CM | POA: Diagnosis not present

## 2018-09-05 DIAGNOSIS — D518 Other vitamin B12 deficiency anemias: Secondary | ICD-10-CM | POA: Diagnosis not present

## 2018-09-05 DIAGNOSIS — E119 Type 2 diabetes mellitus without complications: Secondary | ICD-10-CM | POA: Diagnosis not present

## 2018-09-05 DIAGNOSIS — E559 Vitamin D deficiency, unspecified: Secondary | ICD-10-CM | POA: Diagnosis not present

## 2018-09-05 DIAGNOSIS — I10 Essential (primary) hypertension: Secondary | ICD-10-CM | POA: Diagnosis not present

## 2018-11-07 DIAGNOSIS — R9082 White matter disease, unspecified: Secondary | ICD-10-CM | POA: Diagnosis not present

## 2018-11-07 DIAGNOSIS — R0689 Other abnormalities of breathing: Secondary | ICD-10-CM | POA: Diagnosis not present

## 2018-11-07 DIAGNOSIS — S0990XA Unspecified injury of head, initial encounter: Secondary | ICD-10-CM | POA: Diagnosis not present

## 2018-11-07 DIAGNOSIS — F10929 Alcohol use, unspecified with intoxication, unspecified: Secondary | ICD-10-CM | POA: Diagnosis not present

## 2018-11-07 DIAGNOSIS — R0902 Hypoxemia: Secondary | ICD-10-CM | POA: Diagnosis not present

## 2018-11-07 DIAGNOSIS — W19XXXA Unspecified fall, initial encounter: Secondary | ICD-10-CM | POA: Diagnosis not present

## 2018-11-07 DIAGNOSIS — F10129 Alcohol abuse with intoxication, unspecified: Secondary | ICD-10-CM | POA: Diagnosis not present

## 2018-11-07 DIAGNOSIS — R0602 Shortness of breath: Secondary | ICD-10-CM | POA: Diagnosis not present

## 2018-11-07 DIAGNOSIS — S199XXA Unspecified injury of neck, initial encounter: Secondary | ICD-10-CM | POA: Diagnosis not present

## 2018-11-07 DIAGNOSIS — S299XXA Unspecified injury of thorax, initial encounter: Secondary | ICD-10-CM | POA: Diagnosis not present

## 2018-11-15 DIAGNOSIS — I10 Essential (primary) hypertension: Secondary | ICD-10-CM | POA: Diagnosis not present

## 2018-11-15 DIAGNOSIS — Z23 Encounter for immunization: Secondary | ICD-10-CM | POA: Diagnosis not present

## 2018-11-15 DIAGNOSIS — D518 Other vitamin B12 deficiency anemias: Secondary | ICD-10-CM | POA: Diagnosis not present

## 2018-11-15 DIAGNOSIS — E782 Mixed hyperlipidemia: Secondary | ICD-10-CM | POA: Diagnosis not present

## 2018-11-15 DIAGNOSIS — R296 Repeated falls: Secondary | ICD-10-CM | POA: Diagnosis not present

## 2018-11-15 DIAGNOSIS — I480 Paroxysmal atrial fibrillation: Secondary | ICD-10-CM | POA: Diagnosis not present

## 2018-12-20 DIAGNOSIS — R0789 Other chest pain: Secondary | ICD-10-CM | POA: Diagnosis not present

## 2018-12-20 DIAGNOSIS — R0902 Hypoxemia: Secondary | ICD-10-CM | POA: Diagnosis not present

## 2018-12-20 DIAGNOSIS — J449 Chronic obstructive pulmonary disease, unspecified: Secondary | ICD-10-CM | POA: Diagnosis not present

## 2018-12-20 DIAGNOSIS — R0602 Shortness of breath: Secondary | ICD-10-CM | POA: Diagnosis not present

## 2018-12-20 DIAGNOSIS — R079 Chest pain, unspecified: Secondary | ICD-10-CM | POA: Diagnosis not present

## 2018-12-20 DIAGNOSIS — J80 Acute respiratory distress syndrome: Secondary | ICD-10-CM | POA: Diagnosis not present

## 2018-12-20 DIAGNOSIS — Z03818 Encounter for observation for suspected exposure to other biological agents ruled out: Secondary | ICD-10-CM | POA: Diagnosis not present

## 2018-12-20 DIAGNOSIS — I10 Essential (primary) hypertension: Secondary | ICD-10-CM | POA: Diagnosis not present

## 2018-12-20 DIAGNOSIS — J441 Chronic obstructive pulmonary disease with (acute) exacerbation: Secondary | ICD-10-CM | POA: Diagnosis not present

## 2018-12-20 DIAGNOSIS — I959 Hypotension, unspecified: Secondary | ICD-10-CM | POA: Diagnosis not present

## 2018-12-20 DIAGNOSIS — Z720 Tobacco use: Secondary | ICD-10-CM | POA: Diagnosis not present

## 2018-12-20 DIAGNOSIS — R069 Unspecified abnormalities of breathing: Secondary | ICD-10-CM | POA: Diagnosis not present

## 2018-12-21 DIAGNOSIS — Z23 Encounter for immunization: Secondary | ICD-10-CM | POA: Diagnosis not present

## 2018-12-21 DIAGNOSIS — F10230 Alcohol dependence with withdrawal, uncomplicated: Secondary | ICD-10-CM | POA: Diagnosis not present

## 2018-12-21 DIAGNOSIS — I4892 Unspecified atrial flutter: Secondary | ICD-10-CM | POA: Diagnosis not present

## 2018-12-21 DIAGNOSIS — I480 Paroxysmal atrial fibrillation: Secondary | ICD-10-CM | POA: Diagnosis not present

## 2018-12-21 DIAGNOSIS — F17210 Nicotine dependence, cigarettes, uncomplicated: Secondary | ICD-10-CM | POA: Diagnosis not present

## 2018-12-21 DIAGNOSIS — Z7982 Long term (current) use of aspirin: Secondary | ICD-10-CM | POA: Diagnosis not present

## 2018-12-21 DIAGNOSIS — J441 Chronic obstructive pulmonary disease with (acute) exacerbation: Secondary | ICD-10-CM | POA: Diagnosis not present

## 2018-12-21 DIAGNOSIS — J189 Pneumonia, unspecified organism: Secondary | ICD-10-CM | POA: Diagnosis not present

## 2018-12-21 DIAGNOSIS — J449 Chronic obstructive pulmonary disease, unspecified: Secondary | ICD-10-CM | POA: Diagnosis not present

## 2018-12-21 DIAGNOSIS — M1990 Unspecified osteoarthritis, unspecified site: Secondary | ICD-10-CM | POA: Diagnosis not present

## 2018-12-21 DIAGNOSIS — Z720 Tobacco use: Secondary | ICD-10-CM | POA: Diagnosis not present

## 2018-12-21 DIAGNOSIS — I10 Essential (primary) hypertension: Secondary | ICD-10-CM | POA: Diagnosis not present

## 2018-12-21 DIAGNOSIS — Z03818 Encounter for observation for suspected exposure to other biological agents ruled out: Secondary | ICD-10-CM | POA: Diagnosis not present

## 2018-12-21 DIAGNOSIS — E785 Hyperlipidemia, unspecified: Secondary | ICD-10-CM | POA: Diagnosis not present

## 2018-12-21 DIAGNOSIS — E860 Dehydration: Secondary | ICD-10-CM | POA: Diagnosis not present

## 2018-12-21 DIAGNOSIS — R0602 Shortness of breath: Secondary | ICD-10-CM | POA: Diagnosis not present

## 2018-12-21 DIAGNOSIS — I483 Typical atrial flutter: Secondary | ICD-10-CM | POA: Diagnosis not present

## 2018-12-21 DIAGNOSIS — F419 Anxiety disorder, unspecified: Secondary | ICD-10-CM | POA: Diagnosis not present

## 2018-12-21 DIAGNOSIS — Z7901 Long term (current) use of anticoagulants: Secondary | ICD-10-CM | POA: Diagnosis not present

## 2018-12-21 DIAGNOSIS — Z7952 Long term (current) use of systemic steroids: Secondary | ICD-10-CM | POA: Diagnosis not present

## 2018-12-21 DIAGNOSIS — Z79899 Other long term (current) drug therapy: Secondary | ICD-10-CM | POA: Diagnosis not present

## 2018-12-21 DIAGNOSIS — I959 Hypotension, unspecified: Secondary | ICD-10-CM | POA: Diagnosis not present

## 2018-12-21 DIAGNOSIS — Z7902 Long term (current) use of antithrombotics/antiplatelets: Secondary | ICD-10-CM | POA: Diagnosis not present

## 2018-12-21 DIAGNOSIS — E7800 Pure hypercholesterolemia, unspecified: Secondary | ICD-10-CM | POA: Diagnosis not present

## 2018-12-22 ENCOUNTER — Encounter: Payer: Self-pay | Admitting: Cardiology

## 2018-12-22 DIAGNOSIS — I4892 Unspecified atrial flutter: Secondary | ICD-10-CM

## 2018-12-23 DIAGNOSIS — I4892 Unspecified atrial flutter: Secondary | ICD-10-CM

## 2018-12-23 DIAGNOSIS — J441 Chronic obstructive pulmonary disease with (acute) exacerbation: Secondary | ICD-10-CM

## 2018-12-23 DIAGNOSIS — Z7901 Long term (current) use of anticoagulants: Secondary | ICD-10-CM

## 2018-12-23 DIAGNOSIS — I10 Essential (primary) hypertension: Secondary | ICD-10-CM

## 2019-01-02 DIAGNOSIS — J069 Acute upper respiratory infection, unspecified: Secondary | ICD-10-CM | POA: Diagnosis not present

## 2019-01-02 DIAGNOSIS — U071 COVID-19: Secondary | ICD-10-CM | POA: Diagnosis not present

## 2019-01-02 DIAGNOSIS — Z20828 Contact with and (suspected) exposure to other viral communicable diseases: Secondary | ICD-10-CM | POA: Diagnosis not present

## 2019-01-03 DIAGNOSIS — S3993XA Unspecified injury of pelvis, initial encounter: Secondary | ICD-10-CM | POA: Diagnosis not present

## 2019-01-03 DIAGNOSIS — S0990XA Unspecified injury of head, initial encounter: Secondary | ICD-10-CM | POA: Diagnosis not present

## 2019-01-03 DIAGNOSIS — I959 Hypotension, unspecified: Secondary | ICD-10-CM | POA: Diagnosis not present

## 2019-01-03 DIAGNOSIS — S199XXA Unspecified injury of neck, initial encounter: Secondary | ICD-10-CM | POA: Diagnosis not present

## 2019-01-03 DIAGNOSIS — W19XXXA Unspecified fall, initial encounter: Secondary | ICD-10-CM | POA: Diagnosis not present

## 2019-01-03 DIAGNOSIS — R220 Localized swelling, mass and lump, head: Secondary | ICD-10-CM | POA: Diagnosis not present

## 2019-01-03 DIAGNOSIS — S299XXA Unspecified injury of thorax, initial encounter: Secondary | ICD-10-CM | POA: Diagnosis not present

## 2019-02-13 ENCOUNTER — Other Ambulatory Visit: Payer: Self-pay

## 2019-03-09 NOTE — Progress Notes (Signed)
Cardiology Office Note:    Date:  03/12/2019   ID:  Kyle Farley, DOB 1940-07-03, MRN [id number]  PCP:  Zoila Shutter, NP  Cardiologist:  Shirlee More, MD   Referring MD: Zoila Shutter, NP  ASSESSMENT:    1. Atypical atrial flutter (HCC)   2. Paroxysmal atrial fibrillation ([place])   3. Chronic anticoagulation   4. Hypertensive heart disease with heart failure ([place])   5. Pulmonary emphysema, unspecified emphysema type ([place])   6. Type 2 diabetes mellitus without complication, without long-term current use of insulin (HCC)    PLAN:    In order of problems listed above:  1. Today in sinus rhythm chaotic atrial see discussion need to find out first whether or not he is taking his antiarrhythmic drug either reinstituted or increased dose.  We will resume his anticoagulation. 2. Stable hypertension continue current treatment including calcium channel blocker ACE inhibitor 3. He is quite symptomatic from his underlying COPD I would not expose him to beta-blockers sotalol propafenone or amiodarone.  If an alternate antiarrhythmic drug is needed dofetilide twice dailywould be ideal 4. Stable diabetes managed by his PCP  Next appointment 4 weeks   Medication Adjustments/Labs and Tests Ordered: Current medicines are reviewed at length with the patient today.  Concerns regarding medicines are outlined above.  Orders Placed This Encounter  Procedures  . EKG 12-Lead   Meds ordered this encounter  Medications  . apixaban (ELIQUIS) 5 MG TABS tablet    Sig: Take 1 tablet (5 mg total) by mouth 2 (two) times daily.    Dispense:  60 tablet    Refill:  11     Chief Complaint  Patient presents with  . Atrial Flutter    History of Present Illness:    Kyle Farley is a 79 y.o. male who is being seen today for the evaluation of atrial flutter at the request of Zoila Shutter, NP.  The visit I reviewed his records in care everywhere with [facility] cardiology as well as the recent  [facility] records and summarized below including labs x-rays EKGs and echocardiogram ` He had previously been seen by Dr. Geraldo Pitter at [facility] cardiology more than 3 years ago.  He has a history of paroxysmal atrial fibrillation and previously had been on amiodarone and it was discontinued because of underlying lung disease.  Subsequently was treated with flecainide and anticoagulated.  [place] 0 05/25/2018 at that time he is taking aspirin for stroke prophylaxis and was not taking an antiarrhythmic drug.  His history is noteworthy for hypertension hyperlipidemia and COPD.  He was seen by me during his recent admission to [facility] discharge 12/23/2019.  I reviewed the records from hospitalization he presented was felt to be sinus tachycardia and hypotension but the problem were to have one-to-one conduction of the slow atrial flutter due to colectomy.  Flecainide was discontinued and his anticoagulated and rate was controlled with Cardizem because of his underlying lung disease.  The anticipation was for cardioversion after 3 weeks of antibiotic coagulation.  Lab work on presentation to the hospital include normal CBC potassium 4.5 creatinine 0.90 troponin was checked serially they were all normal.  CTA of the chest abdomen pelvis was performed because of hypotension and showed coronary artery calcification and aortic atherosclerosis along with his known emphysema.  Echocardiogram performed at [facility] reviewed ejection fraction 55 to 60% normal right ventricular size and function both the left and right atrium  are mildly dilated and there is mild mitral and tricuspid regurgitation present.  Independently reviewed that EKG from admission to the hospital and will have it scanned to the chart and it shows one-to-one conduction of atrial flutter rate 130 bpm with right bundle branch block morphology.  Today he is now in atrial flutter he has a chaotic atrial  rhythm.  Tells me at home his heart rates are usually 80-100 at times 140 bpm.  He is having episodes of burning indigestion and is quite limited in his abilities due to his COPD short of breath walking outside of his home bending over no edema exertional angina orthopnea or syncope.  Unfortunately did not bring a list of medications he can Farley me that he stopped taking Eliquis with went back to baby aspirin and will have him stop his aspirin resume anticoagulant and call my office Farley me whether or not he is taking antiarrhythmic drugs.  If taking flecainide I will increase the dose and have him come back in 1 week for an EKG level if not take etiologies to flecainide and if he continues to be symptomatic we will consider referral to EP and initiation of dofetilide.  He is on rate lowering medication calcium channel blocker. Past Medical History:  Diagnosis Date  . Arthritis   . CHF (congestive heart failure) ([place])   . COPD (chronic obstructive pulmonary disease) ([place])   . Dyspnea   . Dysrhythmia    atrial fibrillation  . GERD (gastroesophageal reflux disease)   . History of kidney stones   . Hypertension     Past Surgical History:  Procedure Laterality Date  . LEG SURGERY Right    steal pin placed in right leg 35 years ago    Current Medications: No outpatient medications have been marked as taking for the 03/12/19 encounter (Office Visit) with Richardo Priest, MD.     Allergies:   Patient has no known allergies.   Social History   Socioeconomic History  . Marital status: Married    Spouse name: Not on file  . Number of children: Not on file  . Years of education: Not on file  . Highest education level: Not on file  Occupational History  . Not on file  Tobacco Use  . Smoking status: Current Some Day Smoker    Packs/day: 1.00    Years: 62.00    Pack years: 62.00    Types: Cigarettes, Cigars  . Smokeless tobacco: Never Used  Substance and Sexual Activity  . Alcohol use:  Yes    Alcohol/week: 5.0 standard drinks    Types: 5 Standard drinks or equivalent per week    Comment: 5th of rum a week or 1 drink a day  . Drug use: No  . Sexual activity: Not Currently  Other Topics Concern  . Not on file  Social History Narrative  . Not on file   Social Determinants of Health   Financial Resource Strain:   . Difficulty of Paying Living Expenses: Not on file  Food Insecurity:   . Worried About Charity fundraiser in the Last Year: Not on file  . Ran Out of Food in the Last Year: Not on file  Transportation Needs:   . Lack of Transportation (Medical): Not on file  . Lack of Transportation (Non-Medical): Not on file  Physical Activity:   . Days of Exercise per Week: Not on file  . Minutes of Exercise per Session: Not on file  Stress:   .  Feeling of Stress : Not on file  Social Connections:   . Frequency of Communication with Friends and Family: Not on file  . Frequency of Social Gatherings with Friends and Family: Not on file  . Attends Religious Services: Not on file  . Active Member of Clubs or Organizations: Not on file  . Attends Archivist Meetings: Not on file  . Marital Status: Not on file     Family History: The patient's family history includes Breast cancer in his sister; Cancer in his mother; Diabetes in his mother.  ROS:   ROS Please see the history of present illness.     All other systems reviewed and are negative.  EKGs/Labs/Other Studies Reviewed:    The following studies were reviewed today:   EKG:  EKG is  ordered today.  The ekg ordered today is personally reviewed and demonstrates chaotic atrial rhythm right bundle branch block    Physical Exam:    VS:  BP 138/66   Pulse 93   Temp (!) 96.9 F (36.1 C)   Ht 5\' 9"  (1.753 m)   Wt 164 lb (74.4 kg)   SpO2 97%   BMI 24.22 kg/m     Wt Readings from Last 3 Encounters:  03/12/19 164 lb (74.4 kg)     GEN:  Well nourished, well developed in no acute  distress HEENT: Normal NECK: No JVD; No carotid bruits LYMPHATICS: No lymphadenopathy CARDIAC: RRR, no murmurs, rubs, gallops RESPIRATORY: COPD appearance barrel chested diminished breath sounds ABDOMEN: Soft, non-tender, non-distended MUSCULOSKELETAL:  No edema; No deformity  SKIN: Warm and dry NEUROLOGIC:  Alert and oriented x 3 PSYCHIATRIC:  Normal affect     Signed, Shirlee More, MD  03/12/2019 9:20 AM    [facility]

## 2019-03-12 ENCOUNTER — Telehealth: Payer: Self-pay | Admitting: Cardiology

## 2019-03-12 ENCOUNTER — Encounter: Payer: Self-pay | Admitting: Cardiology

## 2019-03-12 ENCOUNTER — Ambulatory Visit (INDEPENDENT_AMBULATORY_CARE_PROVIDER_SITE_OTHER): Payer: Medicare HMO | Admitting: Cardiology

## 2019-03-12 ENCOUNTER — Other Ambulatory Visit: Payer: Self-pay

## 2019-03-12 VITALS — BP 138/66 | HR 93 | Temp 96.9°F | Ht 69.0 in | Wt 164.0 lb

## 2019-03-12 DIAGNOSIS — Z7901 Long term (current) use of anticoagulants: Secondary | ICD-10-CM | POA: Diagnosis not present

## 2019-03-12 DIAGNOSIS — E119 Type 2 diabetes mellitus without complications: Secondary | ICD-10-CM

## 2019-03-12 DIAGNOSIS — I480 Paroxysmal atrial fibrillation: Secondary | ICD-10-CM | POA: Diagnosis not present

## 2019-03-12 DIAGNOSIS — I484 Atypical atrial flutter: Secondary | ICD-10-CM

## 2019-03-12 DIAGNOSIS — J439 Emphysema, unspecified: Secondary | ICD-10-CM

## 2019-03-12 DIAGNOSIS — I110 Hypertensive heart disease with heart failure: Secondary | ICD-10-CM | POA: Diagnosis not present

## 2019-03-12 DIAGNOSIS — Z79899 Other long term (current) drug therapy: Secondary | ICD-10-CM

## 2019-03-12 MED ORDER — FLECAINIDE ACETATE 50 MG PO TABS: 50.0000 mg | ORAL_TABLET | Freq: Two times a day (BID) | ORAL | 1 refills | Status: DC

## 2019-03-12 MED ORDER — APIXABAN 5 MG PO TABS: 5.0000 mg | ORAL_TABLET | Freq: Two times a day (BID) | ORAL | 11 refills | Status: DC

## 2019-03-12 NOTE — Telephone Encounter (Signed)
Spoke with patient regarding Dr. Joya Gaskins advice: "start flecainide 50mg  twice daily, 1 week he will need a visit I will not be available placement with Dr.Tobb will need an ekg at that time."  Order for Flecainide 50mg  bid placed and pt agreeable. appt in one week with Dr. Harriet Masson is 03/19/19 in [facility]. Pt would rather be seen in [place]. Soonest appt with Dr.Tobb in [place] is 03/26/19 at 11:20. This is preferred by the patient.   Pt verbalized understanding with medication and with follow up appt with Dr.Tobb on 03/26/19 at 11:20 No other questions or concerns at this time.

## 2019-03-12 NOTE — Telephone Encounter (Signed)
New message   Patient states that he is not taking flecainide. Please call if need to discuss.

## 2019-03-12 NOTE — Telephone Encounter (Signed)
Start flecainide 50 mg twice daily 1 week he will need a visit I will not be available placement with Dr. Harriet Masson will need an EKG at that time

## 2019-03-12 NOTE — Patient Instructions (Signed)
Medication Instructions:  Your physician has recommended you make the following change in your medication:  1.  STOP Aspirin 2.  RESTART Eliquis 5 mg taking 1 tablet twice a day  CALL us BACK WHEN YOU GET HOME TO LET us KNOW IF YOU ARE TAKING FLECAINIDE/TAMBOCOR   *If you need a refill on your cardiac medications before your next appointment, please call your pharmacy*  Lab Work: None ordered If you have labs (blood work) drawn today and your tests are completely normal, you will receive your results only by: Marland Kitchen MyChart Message (if you have MyChart) OR . A paper copy in the mail If you have any lab test that is abnormal or we need to change your treatment, we will call you to review the results.  Testing/Procedures: None ordered  Follow-Up: At [facility], you and your health needs are our priority.  As part of our continuing mission to provide you with exceptional heart care, we have created designated Provider Care Teams.  These Care Teams include your primary Cardiologist (physician) and Advanced Practice Providers (APPs -  Physician Assistants and Nurse Practitioners) who all work together to provide you with the care you need, when you need it.  Your next appointment:   4 week(s)  The format for your next appointment:   In Person  Provider:   Shirlee More, MD  Other Instructions

## 2019-03-19 ENCOUNTER — Ambulatory Visit: Payer: Medicare HMO | Admitting: Cardiology

## 2019-03-26 ENCOUNTER — Ambulatory Visit: Payer: Medicare HMO | Admitting: Cardiology

## 2019-03-26 ENCOUNTER — Encounter: Payer: Self-pay | Admitting: Cardiology

## 2019-03-26 ENCOUNTER — Other Ambulatory Visit: Payer: Self-pay

## 2019-03-26 VITALS — BP 100/70 | HR 130 | Ht 69.0 in | Wt 166.2 lb

## 2019-03-26 DIAGNOSIS — F17210 Nicotine dependence, cigarettes, uncomplicated: Secondary | ICD-10-CM

## 2019-03-26 DIAGNOSIS — I10 Essential (primary) hypertension: Secondary | ICD-10-CM

## 2019-03-26 DIAGNOSIS — I480 Paroxysmal atrial fibrillation: Secondary | ICD-10-CM | POA: Diagnosis not present

## 2019-03-26 DIAGNOSIS — I484 Atypical atrial flutter: Secondary | ICD-10-CM | POA: Diagnosis not present

## 2019-03-26 DIAGNOSIS — R000 Tachycardia, unspecified: Secondary | ICD-10-CM | POA: Diagnosis not present

## 2019-03-26 MED ORDER — METOPROLOL SUCCINATE ER 25 MG PO TB24: 12.5000 mg | ORAL_TABLET | Freq: Two times a day (BID) | ORAL | 1 refills | Status: DC

## 2019-03-26 NOTE — Progress Notes (Signed)
Cardiology Office Note:    Date:  03/26/2019   ID:  STORY BRAKEMAN, DOB 11/03/1940, MRN [id number]  PCP:  Zoila Shutter, NP  Cardiologist:  No primary care provider on file.  Electrophysiologist:  None   Referring MD: Zoila Shutter, NP     History of Present Illness:    Kyle Farley is a 79 y.o. male with a hx of paroxysmal atypical atrial flutter on flecainide and Eliquis, COPD, heart disease with heart failure and type 2 diabetes.  Patient was seen by pulmonary on March 12, 2027 at which time 56 and also restarted on his anticoagulation.  Also result establishment of care and hospitalization visit.  Patient for follow-up visit he offers no complaints at this time.  Tells me medication regimen regimen in place on seems to be working..   Past Medical History:  Diagnosis Date  . Arthritis   . CHF (congestive heart failure) ([place])   . COPD (chronic obstructive pulmonary disease) ([place])   . Dyspnea   . Dysrhythmia    atrial fibrillation  . GERD (gastroesophageal reflux disease)   . History of kidney stones   . Hypertension     Past Surgical History:  Procedure Laterality Date  . LEG SURGERY Right    steal pin placed in right leg 35 years ago    Current Medications: Current Meds  Medication Sig  . acetaminophen (ACETAMINOPHEN 8 HOUR) 650 MG CR tablet Take 1,300 mg by mouth 2 (two) times daily as needed for pain.  Marland Kitchen albuterol ([name] HFA;VENTOLIN HFA) 108 (90 Base) MCG/ACT inhaler Inhale 2 puffs into the lungs every 6 (six) hours as needed for wheezing or shortness of breath.  Marland Kitchen apixaban (ELIQUIS) 5 MG TABS tablet Take 1 tablet (5 mg total) by mouth 2 (two) times daily.  . Calcium Carbonate-Vitamin D 600-400 MG-UNIT tablet Take 1 tablet by mouth daily.  . cholecalciferol (VITAMIN D) 400 units TABS tablet Take 400 Units by mouth daily.  Marland Kitchen diltiazem (CARDIZEM CD) 180 MG 24 hr capsule Take 180 mg by mouth daily.  . flecainide (TAMBOCOR) 50 MG tablet Take 1 tablet  (50 mg total) by mouth 2 (two) times daily.  . folic acid (FOLVITE) 1 MG tablet Take 1 mg by mouth daily.  . furosemide (LASIX) 20 MG tablet Take 20 mg by mouth every other day. Take 2 tablets once daily   . gabapentin (NEURONTIN) 600 MG tablet Take 600 mg by mouth 3 (three) times daily.  Marland Kitchen ipratropium-albuterol (DUONEB) 0.5-2.5 (3) MG/3ML SOLN Take 3 mLs by nebulization 2 (two) times daily.  Marland Kitchen lisinopril (PRINIVIL,ZESTRIL) 5 MG tablet Take 5 mg by mouth daily.  . magnesium oxide (MAG-OX) 400 MG tablet Take 400 mg by mouth daily.  . mometasone-formoterol (DULERA) 200-5 MCG/ACT AERO Inhale 2 puffs into the lungs 2 (two) times daily.  . roflumilast (DALIRESP) 500 MCG TABS tablet Take 500 mcg by mouth daily.  . rosuvastatin (CRESTOR) 5 MG tablet Take 5 mg by mouth daily.  Marland Kitchen thiamine (VITAMIN B-1) 100 MG tablet Take 100 mg by mouth daily.  . [DISCONTINUED] metoprolol tartrate (LOPRESSOR) 25 MG tablet Take 12.5 mg by mouth every 6 (six) hours.     Allergies:   Patient has no known allergies.   Social History   Socioeconomic History  . Marital status: Married    Spouse name: Not on file  . Number of children: Not on file  . Years of education: Not on file  . Highest education  level: Not on file  Occupational History  . Not on file  Tobacco Use  . Smoking status: Current Every Day Smoker    Packs/day: 1.00    Years: 62.00    Pack years: 62.00    Types: Cigarettes, Cigars  . Smokeless tobacco: Never Used  Substance and Sexual Activity  . Alcohol use: Yes    Alcohol/week: 5.0 standard drinks    Types: 5 Standard drinks or equivalent per week    Comment: 5th of rum a week or 1 drink a day  . Drug use: No  . Sexual activity: Not Currently  Other Topics Concern  . Not on file  Social History Narrative  . Not on file   Social Determinants of Health   Financial Resource Strain:   . Difficulty of Paying Living Expenses: Not on file  Food Insecurity:   . Worried About Sales executive in the Last Year: Not on file  . Ran Out of Food in the Last Year: Not on file  Transportation Needs:   . Lack of Transportation (Medical): Not on file  . Lack of Transportation (Non-Medical): Not on file  Physical Activity:   . Days of Exercise per Week: Not on file  . Minutes of Exercise per Session: Not on file  Stress:   . Feeling of Stress : Not on file  Social Connections:   . Frequency of Communication with Friends and Family: Not on file  . Frequency of Social Gatherings with Friends and Family: Not on file  . Attends Religious Services: Not on file  . Active Member of Clubs or Organizations: Not on file  . Attends Archivist Meetings: Not on file  . Marital Status: Not on file     Family History: The patient's family history includes Breast cancer in his sister; Cancer in his mother; Diabetes in his mother.  ROS:   Review of Systems  Constitution: Negative for decreased appetite, fever and weight gain.  HENT: Negative for congestion, ear discharge, hoarse voice and sore throat.   Eyes: Negative for discharge, redness, vision loss in right eye and visual halos.  Cardiovascular: Negative for chest pain, dyspnea on exertion, leg swelling, orthopnea and palpitations.  Respiratory: Negative for cough, hemoptysis, shortness of breath and snoring.   Endocrine: Negative for heat intolerance and polyphagia.  Hematologic/Lymphatic: Negative for bleeding problem. Does not bruise/bleed easily.  Skin: Negative for flushing, nail changes, rash and suspicious lesions.  Musculoskeletal: Negative for arthritis, joint pain, muscle cramps, myalgias, neck pain and stiffness.  Gastrointestinal: Negative for abdominal pain, bowel incontinence, diarrhea and excessive appetite.  Genitourinary: Negative for decreased libido, genital sores and incomplete emptying.  Neurological: Negative for brief paralysis, focal weakness, headaches and loss of balance.  Psychiatric/Behavioral:  Negative for altered mental status, depression and suicidal ideas.  Allergic/Immunologic: Negative for HIV exposure and persistent infections.    EKGs/Labs/Other Studies Reviewed:    The following studies were reviewed today:   EKG:  The ekg ordered today demonstrates sinus tachycardia, heart rate 130 bpm, PR interval 200 ms with right bundle branch block to EKG done on March 12, 2019 at which time the patient is in sinus rhythm with arrhythmia right bundle branch block present.  Recent Labs: No results found for requested labs within last 8760 hours.  Recent Lipid Panel No results found for: CHOL, TRIG, HDL, CHOLHDL, VLDL, LDLCALC, LDLDIRECT  Physical Exam:    VS:  BP 100/70   Pulse (!) 130  Ht 5\' 9"  (1.753 m)   Wt 166 lb 3.2 oz (75.4 kg)   BMI 24.54 kg/m     Wt Readings from Last 3 Encounters:  03/26/19 166 lb 3.2 oz (75.4 kg)  03/12/19 164 lb (74.4 kg)     GEN: Well nourished, well developed in no acute distress HEENT: Normal NECK: No JVD; No carotid bruits LYMPHATICS: No lymphadenopathy CARDIAC: S1S2 noted,RRR, no murmurs, rubs, gallops RESPIRATORY:  Clear to auscultation without rales, wheezing or rhonchi  ABDOMEN: Soft, non-tender, non-distended, +bowel sounds, no guarding. EXTREMITIES: No edema, No cyanosis, no clubbing MUSCULOSKELETAL:  No deformity  SKIN: Warm and dry NEUROLOGIC:  Alert and oriented x 3, non-focal PSYCHIATRIC:  Normal affect, good insight  ASSESSMENT:    1. Atypical atrial flutter ([place])   2. Sinus tachycardia   3. Paroxysmal atrial fibrillation (HCC)   4. Essential hypertension   5. Cigarette smoker    PLAN:    Started on flecainide at his last visit, he still is in sinus rhythm.  However he is tachycardic.  Going to change tartrate 12.5 every 6 hours to metoprolol succinate 12.5 twice a day.  He also remain on Cardizem 180 mg daily.  He appears to be clinically volume depleted along with his significant lung disease could be  causing his sinus tachycardia.   Cut back on his Lasix to every other day.  Has been advised to continue to weigh himself daily at home.  Gains greater than 3 pounds in 2 days, 5lbs in a week to go back on taking his Lasix daily.   Tobacco use -cessation advised.  Continue patient rosuvastatin.  Blood work will be done toda for [employer] and mag.  Medication Adjustments/Labs and Tests Ordered: Current medicines are reviewed at length with the patient today.  Concerns regarding medicines are outlined above.  Orders Placed This Encounter  Procedures  . Basic Metabolic Panel (BMET)  . Magnesium   Meds ordered this encounter  Medications  . metoprolol succinate (TOPROL XL) 25 MG 24 hr tablet    Sig: Take 0.5 tablets (12.5 mg total) by mouth 2 (two) times daily.    Dispense:  90 tablet    Refill:  1    Patient Instructions  Medication Instructions:  Your physician has recommended you make the following change in your medication:   STOP: Metoprolol(Lopresor)  START: Toprol XL (metoprolol succinate) 25 mg Take 1/2 tab twice daily  DECREASE: Furosemide 40 mg Take Every OTHER day  *If you need a refill on your cardiac medications before your next appointment, please call your pharmacy*   Lab Work: Your physician recommends that you return for lab work in: TODAY BMP,Magnesium  If you have labs (blood work) drawn today and your tests are completely normal, you will receive your results only by: Marland Kitchen MyChart Message (if you have MyChart) OR . A paper copy in the mail If you have any lab test that is abnormal or we need to change your treatment, we will call you to review the results.   Testing/Procedures: None   Follow-Up: At [facility], you and your health needs are our priority.  As part of our continuing mission to provide you with exceptional heart care, we have created designated Provider Care Teams.  These Care Teams include your primary Cardiologist (physician) and Advanced  Practice Providers (APPs -  Physician Assistants and Nurse Practitioners) who all work together to provide you with the care you need, when you need it.  We recommend  signing up for the patient portal called "MyChart".  Sign up information is provided on this After Visit Summary.  MyChart is used to connect with patients for Virtual Visits (Telemedicine).  Patients are able to view lab/test results, encounter notes, upcoming appointments, etc.  Non-urgent messages can be sent to your provider as well.   To learn more about what you can do with MyChart, go to [email or web address].    Your next appointment:   1 month(s)  The format for your next appointment:   In Person  Provider:   Berniece Salines, DO   Other Instructions      Adopting a Healthy Lifestyle.  Know what a healthy weight is for you (roughly BMI <25) and aim to maintain this   Aim for 7+ servings of fruits and vegetables daily   65-80+ fluid ounces of water or unsweet tea for healthy kidneys   Limit to max 1 drink of alcohol per day; avoid smoking/tobacco   Limit animal fats in diet for cholesterol and heart health - choose grass fed whenever available   Avoid highly processed foods, and foods high in saturated/trans fats   Aim for low stress - take time to unwind and care for your mental health   Aim for 150 min of moderate intensity exercise weekly for heart health, and weights twice weekly for bone health   Aim for 7-9 hours of sleep daily   When it comes to diets, agreement about the perfect plan isnt easy to find, even among the experts. Experts at the [employer] developed an idea known as the Healthy Eating Plate. Just imagine a plate divided into logical, healthy portions.   The emphasis is on diet quality:   Load up on vegetables and fruits - one-half of your plate: Aim for color and variety, and remember that potatoes dont count.   Go for whole grains - one-quarter of your  plate: Whole wheat, barley, wheat berries, quinoa, oats, brown rice, and foods made with them. If you want pasta, go with whole wheat pasta.   Protein power - one-quarter of your plate: Fish, chicken, beans, and nuts are all healthy, versatile protein sources. Limit red meat.   The diet, however, does go beyond the plate, offering a few other suggestions.   Use healthy plant oils, such as olive, canola, soy, corn, sunflower and peanut. Check the labels, and avoid partially hydrogenated oil, which have unhealthy trans fats.   If youre thirsty, drink water. Coffee and tea are good in moderation, but skip sugary drinks and limit milk and dairy products to one or two daily servings.   The type of carbohydrate in the diet is more important than the amount. Some sources of carbohydrates, such as vegetables, fruits, whole grains, and beans-are healthier than others.   Finally, stay active  Signed, Berniece Salines, DO  03/26/2019 11:52 AM    [place]

## 2019-03-26 NOTE — Patient Instructions (Signed)
Medication Instructions:  Your physician has recommended you make the following change in your medication:   STOP: Metoprolol(Lopresor)  START: Toprol XL (metoprolol succinate) 25 mg Take 1/2 tab twice daily  DECREASE: Furosemide 40 mg Take Every OTHER day  *If you need a refill on your cardiac medications before your next appointment, please call your pharmacy*   Lab Work: Your physician recommends that you return for lab work in: TODAY BMP,Magnesium  If you have labs (blood work) drawn today and your tests are completely normal, you will receive your results only by: Marland Kitchen MyChart Message (if you have MyChart) OR . A paper copy in the mail If you have any lab test that is abnormal or we need to change your treatment, we will call you to review the results.   Testing/Procedures: None   Follow-Up: At [facility], you and your health needs are our priority.  As part of our continuing mission to provide you with exceptional heart care, we have created designated Provider Care Teams.  These Care Teams include your primary Cardiologist (physician) and Advanced Practice Providers (APPs -  Physician Assistants and Nurse Practitioners) who all work together to provide you with the care you need, when you need it.  We recommend signing up for the patient portal called "MyChart".  Sign up information is provided on this After Visit Summary.  MyChart is used to connect with patients for Virtual Visits (Telemedicine).  Patients are able to view lab/test results, encounter notes, upcoming appointments, etc.  Non-urgent messages can be sent to your provider as well.   To learn more about what you can do with MyChart, go to [email or web address].    Your next appointment:   1 month(s)  The format for your next appointment:   In Person  Provider:   Berniece Salines, DO   Other Instructions

## 2019-03-27 LAB — BASIC METABOLIC PANEL
BUN/Creatinine Ratio: 23 (ref 10–24)
BUN: 21 mg/dL (ref 8–27)
CO2: 24 mmol/L (ref 20–29)
Calcium: 9.9 mg/dL (ref 8.6–10.2)
Chloride: 95 mmol/L — ABNORMAL LOW (ref 96–106)
Creatinine, Ser: 0.9 mg/dL (ref 0.76–1.27)
GFR calc Af Amer: 95 mL/min/{1.73_m2} (ref >59)
GFR calc non Af Amer: 82 mL/min/{1.73_m2} (ref >59)
Glucose: 90 mg/dL (ref 65–99)
Potassium: 4.7 mmol/L (ref 3.5–5.2)
Sodium: 136 mmol/L (ref 134–144)

## 2019-03-27 LAB — MAGNESIUM: Magnesium: 1.9 mg/dL (ref 1.6–2.3)

## 2019-04-11 NOTE — Progress Notes (Signed)
Cardiology Office Note:    Date:  04/12/2019   ID:  Kyle Farley, DOB [birth year], MRN [id number]  PCP:  Zoila Shutter, NP  Cardiologist:  Shirlee More, MD    Referring MD: Zoila Shutter, NP    ASSESSMENT:    1. Paroxysmal atrial fibrillation (HCC)   2. Atypical atrial flutter (HCC)   3. Hypertensive heart disease with heart failure ([place])   4. COPD exacerbation ([place])    PLAN:    In order of problems listed above:  1. He is improved atrial arrhythmias under reasonable control will continue flecainide along with rate slowing medication calcium channel blocker beta-blocker to avoid the one-to-one conduction he had at the hospital.  Continue his anticoagulant 2. BP at target fluid overload either heart failure or cor pulmonale is well controlled with no edema takes diuretic as needed has normal ejection fraction recent renal function potassium are normal 3. COPD is stable and tolerates current cardiac medications including low-dose selective beta-blocker   Next appointment: 6 months   Medication Adjustments/Labs and Tests Ordered: Current medicines are reviewed at length with the patient today.  Concerns regarding medicines are outlined above.  No orders of the defined types were placed in this encounter.  No orders of the defined types were placed in this encounter.   Chief Complaint  Patient presents with  . Follow-up  . Atrial Fibrillation  . Atrial Flutter    History of Present Illness:    Kyle Farley is a 79 y.o. male with a hx of paroxysmal atrial fibrillation and previously had been on amiodarone and it was discontinued because of underlying lung disease. Subsequently was treated with flecainide and anticoagulated.  Sotalol and beta-blockers were avoided because of his underlying severe COPD.  Alternative antiarrhythmic drug is needed dofetilide would be appropriate. He was last seen by me 03/12/2019 and was in atrial flutter with a rapid heart rate.Marland Kitchen  He  was seen by Dr. Harriet Masson 03/26/2019 in sinus rhythm continued on flecainide.  Echocardiogram performed at [facility] December 2020 showed normal ejection fraction 55 to 60% normal right ventricular size and function and mild biatrial enlargement.  EKG showed right bundle branch block.  In December he had one-to-one conduction of atrial tachycardia and a rate of 130 bpm. Compliance with diet, lifestyle and medications: Yes  Overall he is doing better he has rare episodes of rapid heart rate but not severe sustained despite his severe COPD he tolerates his beta-blocker calcium channel blocker and flecainide.  It is difficult to treat him as I do not think he would tolerate any drug with strong beta-blocker effect like sotalol propafenone or Rythmol he is a poor candidate for amiodarone.  If he fails flecainide I think referral to EP for consideration of intervention or dofetilide to be appropriate.  At times he drinks heavily and I told him to moderate his alcohol intake as it can precipitate his atrial arrhythmias.  Despite severe COPD he remains active due to starting work outside and only has shortness of breath more than usual activity no edema chest pain palpitation or syncope.  His EKG today shows sinus rhythm right bundle branch block frequent APCs   Past Medical History:  Diagnosis Date  . Arthritis   . CHF (congestive heart failure) ([place])   . COPD (chronic obstructive pulmonary disease) ([place])   . Dyspnea   . Dysrhythmia    atrial fibrillation  . GERD (gastroesophageal reflux disease)   . History of kidney  stones   . Hypertension     Past Surgical History:  Procedure Laterality Date  . LEG SURGERY Right    steal pin placed in right leg 35 years ago    Current Medications: Current Meds  Medication Sig  . acetaminophen (ACETAMINOPHEN 8 HOUR) 650 MG CR tablet Take 1,300 mg by mouth 2 (two) times daily as needed for pain.  Marland Kitchen albuterol ([name] HFA;VENTOLIN HFA) 108 (90 Base)  MCG/ACT inhaler Inhale 2 puffs into the lungs every 6 (six) hours as needed for wheezing or shortness of breath.  Marland Kitchen apixaban (ELIQUIS) 5 MG TABS tablet Take 1 tablet (5 mg total) by mouth 2 (two) times daily.  . Calcium Carbonate-Vitamin D 600-400 MG-UNIT tablet Take 1 tablet by mouth daily.  . cholecalciferol (VITAMIN D) 400 units TABS tablet Take 400 Units by mouth daily.  Marland Kitchen diltiazem (CARDIZEM CD) 180 MG 24 hr capsule Take 180 mg by mouth daily.  . flecainide (TAMBOCOR) 50 MG tablet Take 1 tablet (50 mg total) by mouth 2 (two) times daily.  . folic acid (FOLVITE) 1 MG tablet Take 1 mg by mouth daily.  . furosemide (LASIX) 20 MG tablet Take 20 mg by mouth every other day. Take 2 tablets once daily   . gabapentin (NEURONTIN) 600 MG tablet Take 600 mg by mouth 3 (three) times daily.  Marland Kitchen ipratropium-albuterol (DUONEB) 0.5-2.5 (3) MG/3ML SOLN Take 3 mLs by nebulization 2 (two) times daily.  Marland Kitchen lisinopril (PRINIVIL,ZESTRIL) 5 MG tablet Take 5 mg by mouth daily.  . magnesium oxide (MAG-OX) 400 MG tablet Take 400 mg by mouth daily.  . metoprolol succinate (TOPROL XL) 25 MG 24 hr tablet Take 0.5 tablets (12.5 mg total) by mouth 2 (two) times daily.  . mometasone-formoterol (DULERA) 200-5 MCG/ACT AERO Inhale 2 puffs into the lungs 2 (two) times daily.  . roflumilast (DALIRESP) 500 MCG TABS tablet Take 500 mcg by mouth daily.  . rosuvastatin (CRESTOR) 5 MG tablet Take 5 mg by mouth daily.  Marland Kitchen thiamine (VITAMIN B-1) 100 MG tablet Take 100 mg by mouth daily.     Allergies:   Patient has no known allergies.   Social History   Socioeconomic History  . Marital status: Married    Spouse name: Not on file  . Number of children: Not on file  . Years of education: Not on file  . Highest education level: Not on file  Occupational History  . Not on file  Tobacco Use  . Smoking status: Current Every Day Smoker    Packs/day: 1.00    Years: 62.00    Pack years: 62.00    Types: Cigarettes, Cigars  .  Smokeless tobacco: Never Used  Substance and Sexual Activity  . Alcohol use: Yes    Alcohol/week: 5.0 standard drinks    Types: 5 Standard drinks or equivalent per week    Comment: 5th of rum a week or 1 drink a day  . Drug use: No  . Sexual activity: Not Currently  Other Topics Concern  . Not on file  Social History Narrative  . Not on file   Social Determinants of Health   Financial Resource Strain:   . Difficulty of Paying Living Expenses:   Food Insecurity:   . Worried About Charity fundraiser in the Last Year:   . Arboriculturist in the Last Year:   Transportation Needs:   . Film/video editor (Medical):   Marland Kitchen Lack of Transportation (Non-Medical):   Physical  Activity:   . Days of Exercise per Week:   . Minutes of Exercise per Session:   Stress:   . Feeling of Stress :   Social Connections:   . Frequency of Communication with Friends and Family:   . Frequency of Social Gatherings with Friends and Family:   . Attends Religious Services:   . Active Member of Clubs or Organizations:   . Attends Archivist Meetings:   Marland Kitchen Marital Status:      Family History: The patient's family history includes Breast cancer in his sister; Cancer in his mother; Diabetes in his mother. ROS:   Please see the history of present illness.    All other systems reviewed and are negative.  EKGs/Labs/Other Studies Reviewed:    The following studies were reviewed today:   Recent Labs: 03/26/2019: BUN 21; Creatinine, Ser 0.90; Magnesium 1.9; Potassium 4.7; Sodium 136  Recent Lipid Panel No results found for: CHOL, TRIG, HDL, CHOLHDL, VLDL, LDLCALC, LDLDIRECT  Physical Exam:    VS:  BP 110/60   Pulse 71   Ht 5\' 9"  (1.753 m)   Wt 164 lb (74.4 kg)   SpO2 97%   BMI 24.22 kg/m     Wt Readings from Last 3 Encounters:  04/12/19 164 lb (74.4 kg)  03/26/19 166 lb 3.2 oz (75.4 kg)  03/12/19 164 lb (74.4 kg)     GEN: COPD appearance deeply pigmented well nourished, well  developed in no acute distress HEENT: Normal NECK: No JVD; No carotid bruits LYMPHATICS: No lymphadenopathy CARDIAC: Distant heart sounds RRR, no murmurs, rubs, gallops RESPIRATORY:  Clear to auscultation without rales, wheezing or rhonchi  ABDOMEN: Soft, non-tender, non-distended MUSCULOSKELETAL:  No edema; No deformity  SKIN: Warm and dry NEUROLOGIC:  Alert and oriented x 3 PSYCHIATRIC:  Normal affect    Signed, Shirlee More, MD  04/12/2019 11:46 AM    [place]

## 2019-04-12 ENCOUNTER — Ambulatory Visit (INDEPENDENT_AMBULATORY_CARE_PROVIDER_SITE_OTHER): Payer: Medicare HMO | Admitting: Cardiology

## 2019-04-12 ENCOUNTER — Encounter: Payer: Self-pay | Admitting: Cardiology

## 2019-04-12 ENCOUNTER — Other Ambulatory Visit: Payer: Self-pay

## 2019-04-12 VITALS — BP 110/60 | HR 71 | Ht 69.0 in | Wt 164.0 lb

## 2019-04-12 DIAGNOSIS — I484 Atypical atrial flutter: Secondary | ICD-10-CM | POA: Diagnosis not present

## 2019-04-12 DIAGNOSIS — I480 Paroxysmal atrial fibrillation: Secondary | ICD-10-CM | POA: Diagnosis not present

## 2019-04-12 DIAGNOSIS — I110 Hypertensive heart disease with heart failure: Secondary | ICD-10-CM

## 2019-04-12 DIAGNOSIS — J441 Chronic obstructive pulmonary disease with (acute) exacerbation: Secondary | ICD-10-CM | POA: Diagnosis not present

## 2019-04-12 NOTE — Addendum Note (Signed)
Addended by: Shirlee More on: 04/12/2019 11:48 AM   Modules accepted: Level of Service

## 2019-04-12 NOTE — Patient Instructions (Signed)
Medication Instructions:  Your physician recommends that you continue on your current medications as directed. Please refer to the Current Medication list given to you today.  *If you need a refill on your cardiac medications before your next appointment, please call your pharmacy*   Lab Work: None If you have labs (blood work) drawn today and your tests are completely normal, you will receive your results only by: Marland Kitchen MyChart Message (if you have MyChart) OR . A paper copy in the mail If you have any lab test that is abnormal or we need to change your treatment, we will call you to review the results.   Testing/Procedures: None   Follow-Up: At [facility], you and your health needs are our priority.  As part of our continuing mission to provide you with exceptional heart care, we have created designated Provider Care Teams.  These Care Teams include your primary Cardiologist (physician) and Advanced Practice Providers (APPs -  Physician Assistants and Nurse Practitioners) who all work together to provide you with the care you need, when you need it.  We recommend signing up for the patient portal called "MyChart".  Sign up information is provided on this After Visit Summary.  MyChart is used to connect with patients for Virtual Visits (Telemedicine).  Patients are able to view lab/test results, encounter notes, upcoming appointments, etc.  Non-urgent messages can be sent to your provider as well.   To learn more about what you can do with MyChart, go to [email or web address].    Your next appointment:   6 month(s)  The format for your next appointment:   In Person  Provider:   Shirlee More, MD   Other Instructions Don't forget to get your COVID Vaccine!

## 2019-04-30 ENCOUNTER — Ambulatory Visit: Payer: Medicare HMO | Admitting: Cardiology

## 2019-05-08 ENCOUNTER — Other Ambulatory Visit: Payer: Self-pay | Admitting: Cardiology

## 2019-06-05 ENCOUNTER — Telehealth: Payer: Self-pay | Admitting: Cardiology

## 2019-06-05 NOTE — Telephone Encounter (Signed)
Patient with diagnosis of atrial fibrillation on Eliquis for anticoagulation.    Procedure: cataract with pupil stretch Date of procedure: 06/12/19  CHADS2-VASc score of  4 (CHF, HTN, AGE x 2)  CrCl 72.3 Platelet count 236  Per office protocol, patient can hold Eliquis for 2 days prior to procedure.

## 2019-06-05 NOTE — Telephone Encounter (Signed)
   [facility] Pre-operative Risk Assessment    HEARTCARE STAFF: - Please ensure there is not already an duplicate clearance open for this procedure - Under Visit Info/Reason for Call, type in Other and utilize the format Clearance MM/DD/YY or Clearance TBD  Request for surgical clearance:  1. What type of surgery is being performed? cataract surgery with pupil stretch  2. When is this surgery scheduled? 06/12/2019  3. What type of clearance is required (medical clearance vs. Pharmacy clearance to hold med vs. Both)? pharmacy  4. Are there any medications that need to be held prior to surgery and how long? Hold eliquis 2 days prior  5. Practice name and name of physician performing surgery? [facility], Dr. Aram Beecham  6. What is the office phone number? [phone]   7.   What is the office fax number?: [phone]  8.   Anesthesia type (None, local, MAC, general) ? IV sedation, local   Kyle Farley 06/05/2019, 1:47 PM  _________________________________________________________________   (provider comments below)

## 2019-06-06 NOTE — Telephone Encounter (Signed)
   Primary Cardiologist: No primary care provider on file.  Chart reviewed as part of pre-operative protocol coverage. Given past medical history and time since last visit, based on ACC/AHA guidelines, AAQIL MASSUCCI would be at acceptable risk for the planned procedure without further cardiovascular testing.   Recently seen by Dr. Bettina Gavia without complaints.   Per pharmacy:  Patient with diagnosis of atrial fibrillation on Eliquis for anticoagulation.    Procedure: cataract with pupil stretch Date of procedure: 06/12/19  CHADS2-VASc score of  4 (CHF, HTN, AGE x 2)  CrCl 72.3 Platelet count 236  Per office protocol, patient can hold Eliquis for 2 days prior to procedure.    I will route this recommendation to the requesting party via Epic fax function and remove from pre-op pool.  Please call with questions.  Kathyrn Drown, NP 06/06/2019, 1:14 PM

## 2019-07-10 DIAGNOSIS — J449 Chronic obstructive pulmonary disease, unspecified: Secondary | ICD-10-CM

## 2019-07-10 DIAGNOSIS — I10 Essential (primary) hypertension: Secondary | ICD-10-CM

## 2019-07-10 DIAGNOSIS — E785 Hyperlipidemia, unspecified: Secondary | ICD-10-CM

## 2019-07-10 DIAGNOSIS — I4892 Unspecified atrial flutter: Secondary | ICD-10-CM

## 2019-07-10 NOTE — Telephone Encounter (Signed)
Dr. Sharlett Iles with [facility] is calling to report patient's HR (120) prior to procedure. However she states surgical clearance request has not yet been submitted. Please return call to discuss eligibility for procedure. Dr. Buel Ream personal number will be found in pre-op team's secure chat.      [facility] Pre-operative Risk Assessment    Request for surgical clearance:  1. What type of surgery is being performed? Cataract Surgery   2. When is this surgery scheduled? 06/09/19   3. What type of clearance is required (medical clearance vs. Pharmacy clearance to hold med vs. Both)? Both  4. Are there any medications that need to be held prior to surgery and how long? Patient held Eliquis 2 days   5. Practice name and name of physician performing surgery? Dr. Lysle Morales   6. What is your office phone number? [phone] (ext#: [phone])   7.   What is your office fax number? [phone]  8.   Anesthesia type (None, local, MAC, general) ? MAC   Kyle Farley 07/10/2019, 8:21 AM  _________________________________________________________________   (provider comments below)

## 2019-07-10 NOTE — Telephone Encounter (Signed)
Spoke with Dr. Sharlett Iles and recommendations given and advised to contact Dr. Agustin Cree who is covering at [facility] today.

## 2019-09-28 DIAGNOSIS — I10 Essential (primary) hypertension: Secondary | ICD-10-CM | POA: Diagnosis not present

## 2019-09-28 DIAGNOSIS — I480 Paroxysmal atrial fibrillation: Secondary | ICD-10-CM | POA: Diagnosis not present

## 2019-09-28 DIAGNOSIS — D518 Other vitamin B12 deficiency anemias: Secondary | ICD-10-CM | POA: Diagnosis not present

## 2019-09-28 DIAGNOSIS — E782 Mixed hyperlipidemia: Secondary | ICD-10-CM | POA: Diagnosis not present

## 2019-10-09 NOTE — Progress Notes (Signed)
Cardiology Office Note:    Date:  10/10/2019   ID:  Kyle Farley, DOB Feb 21, 1940, MRN [id number]  PCP:  Zoila Shutter, NP  Cardiologist:  Shirlee More, MD    Referring MD: Zoila Shutter, NP    ASSESSMENT:    1. Paroxysmal atrial fibrillation (HCC)   2. Atypical atrial flutter (HCC)   3. High risk medication use   4. Chronic anticoagulation   5. Hypertensive heart disease with heart failure ([place])   6. Mixed hyperlipidemia    PLAN:    In order of problems listed above:  1. He is doing well tolerating his flecainide no evidence of toxicity and his anticoagulant without bleeding complication continue flecainide beta-blocker Cardizem and anticoagulant. 2. Stable no evidence of toxicity of flecainide 3. Target continue current treatment   Next appointment: 6 months  Medication Adjustments/Labs and Tests Ordered: Current medicines are reviewed at length with the patient today.  Concerns regarding medicines are outlined above.  No orders of the defined types were placed in this encounter.  No orders of the defined types were placed in this encounter.   Chief Complaint  Patient presents with  . Follow-up    History of Present Illness:    Kyle Farley is a 79 y.o. male with a hx of paroxysmal atrial fibrillation and paroxysmal atrial tachycardia, anticoagulated, COPD, right bundle branch block, last seen 04/12/2019.  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 Compliance with diet, lifestyle and medications: Yes  Remains vigorous active does garden work no exercise intolerance dyspnea palpitation lightheadedness chest pain syncope or bleeding from his anticoagulant Past Medical History:  Diagnosis Date  . Arthritis   . CHF (congestive heart failure) ([place])   . COPD (chronic obstructive pulmonary disease) ([place])   . Dyspnea   . Dysrhythmia    atrial fibrillation  . GERD (gastroesophageal reflux  disease)   . History of kidney stones   . Hypertension     Past Surgical History:  Procedure Laterality Date  . LEG SURGERY Right    steal pin placed in right leg 35 years ago    Current Medications: Current Meds  Medication Sig  . acetaminophen (ACETAMINOPHEN 8 HOUR) 650 MG CR tablet Take 1,300 mg by mouth 2 (two) times daily as needed for pain.  Marland Kitchen albuterol ([name] HFA;VENTOLIN HFA) 108 (90 Base) MCG/ACT inhaler Inhale 2 puffs into the lungs every 6 (six) hours as needed for wheezing or shortness of breath.  Marland Kitchen apixaban (ELIQUIS) 5 MG TABS tablet Take 1 tablet (5 mg total) by mouth 2 (two) times daily.  . Calcium Carbonate-Vitamin D 600-400 MG-UNIT tablet Take 1 tablet by mouth daily.  . cholecalciferol (VITAMIN D) 400 units TABS tablet Take 400 Units by mouth daily.  Marland Kitchen diltiazem (CARDIZEM CD) 180 MG 24 hr capsule Take 180 mg by mouth daily.  . flecainide (TAMBOCOR) 50 MG tablet Take 1 tablet by mouth twice daily  . folic acid (FOLVITE) 1 MG tablet Take 1 mg by mouth daily.  . furosemide (LASIX) 20 MG tablet Take 20 mg by mouth every other day. Take 2 tablets once daily   . gabapentin (NEURONTIN) 600 MG tablet Take 600 mg by mouth 3 (three) times daily.  Marland Kitchen ipratropium-albuterol (DUONEB) 0.5-2.5 (3) MG/3ML SOLN Take 3 mLs by nebulization 2 (two) times daily.  Marland Kitchen lisinopril (PRINIVIL,ZESTRIL) 5 MG tablet Take 5 mg by mouth daily.  . magnesium oxide (MAG-OX) 400 MG tablet  Take 400 mg by mouth daily.  . metoprolol succinate (TOPROL XL) 25 MG 24 hr tablet Take 0.5 tablets (12.5 mg total) by mouth 2 (two) times daily.  . mometasone-formoterol (DULERA) 200-5 MCG/ACT AERO Inhale 2 puffs into the lungs 2 (two) times daily.  . roflumilast (DALIRESP) 500 MCG TABS tablet Take 500 mcg by mouth daily.  Marland Kitchen thiamine (VITAMIN B-1) 100 MG tablet Take 100 mg by mouth daily.  . [DISCONTINUED] rosuvastatin (CRESTOR) 5 MG tablet Take 5 mg by mouth daily.     Allergies:   Patient has no known allergies.    Social History   Socioeconomic History  . Marital status: Married    Spouse name: Not on file  . Number of children: Not on file  . Years of education: Not on file  . Highest education level: Not on file  Occupational History  . Not on file  Tobacco Use  . Smoking status: Current Every Day Smoker    Packs/day: 1.00    Years: 62.00    Pack years: 62.00    Types: Cigarettes, Cigars  . Smokeless tobacco: Never Used  Vaping Use  . Vaping Use: Never used  Substance and Sexual Activity  . Alcohol use: Yes    Alcohol/week: 5.0 standard drinks    Types: 5 Standard drinks or equivalent per week    Comment: 5th of rum a week or 1 drink a day  . Drug use: No  . Sexual activity: Not Currently  Other Topics Concern  . Not on file  Social History Narrative  . Not on file   Social Determinants of Health   Financial Resource Strain:   . Difficulty of Paying Living Expenses: Not on file  Food Insecurity:   . Worried About Charity fundraiser in the Last Year: Not on file  . Ran Out of Food in the Last Year: Not on file  Transportation Needs:   . Lack of Transportation (Medical): Not on file  . Lack of Transportation (Non-Medical): Not on file  Physical Activity:   . Days of Exercise per Week: Not on file  . Minutes of Exercise per Session: Not on file  Stress:   . Feeling of Stress : Not on file  Social Connections:   . Frequency of Communication with Friends and Family: Not on file  . Frequency of Social Gatherings with Friends and Family: Not on file  . Attends Religious Services: Not on file  . Active Member of Clubs or Organizations: Not on file  . Attends Archivist Meetings: Not on file  . Marital Status: Not on file     Family History: The patient's family history includes Breast cancer in his sister; Cancer in his mother; Diabetes in his mother. ROS:   Please see the history of present illness.    All other systems reviewed and are  negative.  EKGs/Labs/Other Studies Reviewed:    The following studies were reviewed today:  EKG:  EKG ordered today and personally reviewed.  The ekg ordered today demonstrates sinus rhythm frequent APCs right bundle branch block no evidence of 1C antiarrhythmic drug toxicity  Recent Labs:  Done 3 weeks ago requested from his PCP 03/26/2019: BUN 21; Creatinine, Ser 0.90; Magnesium 1.9; Potassium 4.7; Sodium 136  Recent Lipid Panel No results found for: CHOL, TRIG, HDL, CHOLHDL, VLDL, LDLCALC, LDLDIRECT  Physical Exam:    VS:  BP 100/64 (BP Location: Right Arm, Patient Position: Sitting, Cuff Size: Normal)   Pulse  81   Ht 5\' 9"  (1.753 m)   Wt 156 lb (70.8 kg)   SpO2 98%   BMI 23.04 kg/m     Wt Readings from Last 3 Encounters:  10/10/19 156 lb (70.8 kg)  04/12/19 164 lb (74.4 kg)  03/26/19 166 lb 3.2 oz (75.4 kg)     GEN:  Well nourished, well developed in no acute distress HEENT: Normal NECK: No JVD; No carotid bruits LYMPHATICS: No lymphadenopathy CARDIAC: RRR, no murmurs, rubs, gallops RESPIRATORY:  Clear to auscultation without rales, wheezing or rhonchi  ABDOMEN: Soft, non-tender, non-distended MUSCULOSKELETAL:  No edema; No deformity  SKIN: Warm and dry NEUROLOGIC:  Alert and oriented x 3 PSYCHIATRIC:  Normal affect    Signed, Shirlee More, MD  10/10/2019 4:59 PM    [facility]

## 2019-10-10 ENCOUNTER — Ambulatory Visit: Payer: Medicare HMO | Admitting: Cardiology

## 2019-10-10 ENCOUNTER — Encounter: Payer: Self-pay | Admitting: Cardiology

## 2019-10-10 ENCOUNTER — Other Ambulatory Visit: Payer: Self-pay

## 2019-10-10 VITALS — BP 100/64 | HR 81 | Ht 69.0 in | Wt 156.0 lb

## 2019-10-10 DIAGNOSIS — Z79899 Other long term (current) drug therapy: Secondary | ICD-10-CM | POA: Diagnosis not present

## 2019-10-10 DIAGNOSIS — I480 Paroxysmal atrial fibrillation: Secondary | ICD-10-CM | POA: Diagnosis not present

## 2019-10-10 DIAGNOSIS — I110 Hypertensive heart disease with heart failure: Secondary | ICD-10-CM | POA: Diagnosis not present

## 2019-10-10 DIAGNOSIS — I484 Atypical atrial flutter: Secondary | ICD-10-CM | POA: Diagnosis not present

## 2019-10-10 DIAGNOSIS — Z7901 Long term (current) use of anticoagulants: Secondary | ICD-10-CM | POA: Diagnosis not present

## 2019-10-10 NOTE — Patient Instructions (Signed)

## 2019-11-16 DIAGNOSIS — R69 Illness, unspecified: Secondary | ICD-10-CM | POA: Diagnosis not present

## 2019-11-16 DIAGNOSIS — I4891 Unspecified atrial fibrillation: Secondary | ICD-10-CM | POA: Diagnosis not present

## 2019-11-16 DIAGNOSIS — D6869 Other thrombophilia: Secondary | ICD-10-CM | POA: Diagnosis not present

## 2019-11-16 DIAGNOSIS — Z008 Encounter for other general examination: Secondary | ICD-10-CM | POA: Diagnosis not present

## 2019-11-16 DIAGNOSIS — Z7901 Long term (current) use of anticoagulants: Secondary | ICD-10-CM | POA: Diagnosis not present

## 2019-11-16 DIAGNOSIS — I10 Essential (primary) hypertension: Secondary | ICD-10-CM | POA: Diagnosis not present

## 2019-11-16 DIAGNOSIS — K219 Gastro-esophageal reflux disease without esophagitis: Secondary | ICD-10-CM | POA: Diagnosis not present

## 2019-11-16 DIAGNOSIS — Z803 Family history of malignant neoplasm of breast: Secondary | ICD-10-CM | POA: Diagnosis not present

## 2019-11-16 DIAGNOSIS — J439 Emphysema, unspecified: Secondary | ICD-10-CM | POA: Diagnosis not present

## 2019-11-16 DIAGNOSIS — N529 Male erectile dysfunction, unspecified: Secondary | ICD-10-CM | POA: Diagnosis not present

## 2019-11-16 DIAGNOSIS — I739 Peripheral vascular disease, unspecified: Secondary | ICD-10-CM | POA: Diagnosis not present

## 2019-11-18 DIAGNOSIS — E782 Mixed hyperlipidemia: Secondary | ICD-10-CM | POA: Diagnosis not present

## 2019-11-18 DIAGNOSIS — I10 Essential (primary) hypertension: Secondary | ICD-10-CM | POA: Diagnosis not present

## 2019-11-18 DIAGNOSIS — D518 Other vitamin B12 deficiency anemias: Secondary | ICD-10-CM | POA: Diagnosis not present

## 2019-11-18 DIAGNOSIS — I480 Paroxysmal atrial fibrillation: Secondary | ICD-10-CM | POA: Diagnosis not present

## 2019-11-24 DIAGNOSIS — R062 Wheezing: Secondary | ICD-10-CM | POA: Diagnosis not present

## 2019-11-24 DIAGNOSIS — R519 Headache, unspecified: Secondary | ICD-10-CM | POA: Diagnosis not present

## 2019-11-24 DIAGNOSIS — R69 Illness, unspecified: Secondary | ICD-10-CM | POA: Diagnosis not present

## 2019-11-24 DIAGNOSIS — Z7902 Long term (current) use of antithrombotics/antiplatelets: Secondary | ICD-10-CM | POA: Diagnosis not present

## 2019-11-24 DIAGNOSIS — I4891 Unspecified atrial fibrillation: Secondary | ICD-10-CM | POA: Diagnosis not present

## 2019-11-24 DIAGNOSIS — S0081XA Abrasion of other part of head, initial encounter: Secondary | ICD-10-CM | POA: Diagnosis not present

## 2019-11-24 DIAGNOSIS — W19XXXA Unspecified fall, initial encounter: Secondary | ICD-10-CM | POA: Diagnosis not present

## 2019-11-24 DIAGNOSIS — S0990XA Unspecified injury of head, initial encounter: Secondary | ICD-10-CM | POA: Diagnosis not present

## 2019-11-24 DIAGNOSIS — R296 Repeated falls: Secondary | ICD-10-CM | POA: Diagnosis not present

## 2019-11-24 DIAGNOSIS — R0602 Shortness of breath: Secondary | ICD-10-CM | POA: Diagnosis not present

## 2019-11-24 DIAGNOSIS — S0031XA Abrasion of nose, initial encounter: Secondary | ICD-10-CM | POA: Diagnosis not present

## 2019-11-24 DIAGNOSIS — I959 Hypotension, unspecified: Secondary | ICD-10-CM | POA: Diagnosis not present

## 2019-11-24 DIAGNOSIS — I10 Essential (primary) hypertension: Secondary | ICD-10-CM | POA: Diagnosis not present

## 2019-11-24 DIAGNOSIS — Z043 Encounter for examination and observation following other accident: Secondary | ICD-10-CM | POA: Diagnosis not present

## 2019-11-24 DIAGNOSIS — R4020 Unspecified coma: Secondary | ICD-10-CM | POA: Diagnosis not present

## 2019-12-11 DIAGNOSIS — Z23 Encounter for immunization: Secondary | ICD-10-CM | POA: Diagnosis not present

## 2019-12-11 DIAGNOSIS — I10 Essential (primary) hypertension: Secondary | ICD-10-CM | POA: Diagnosis not present

## 2019-12-11 DIAGNOSIS — I480 Paroxysmal atrial fibrillation: Secondary | ICD-10-CM | POA: Diagnosis not present

## 2019-12-11 DIAGNOSIS — D518 Other vitamin B12 deficiency anemias: Secondary | ICD-10-CM | POA: Diagnosis not present

## 2019-12-11 DIAGNOSIS — E782 Mixed hyperlipidemia: Secondary | ICD-10-CM | POA: Diagnosis not present

## 2019-12-17 DIAGNOSIS — I480 Paroxysmal atrial fibrillation: Secondary | ICD-10-CM | POA: Diagnosis not present

## 2019-12-17 DIAGNOSIS — J441 Chronic obstructive pulmonary disease with (acute) exacerbation: Secondary | ICD-10-CM | POA: Diagnosis not present

## 2019-12-17 DIAGNOSIS — I5023 Acute on chronic systolic (congestive) heart failure: Secondary | ICD-10-CM | POA: Diagnosis not present

## 2019-12-17 DIAGNOSIS — D518 Other vitamin B12 deficiency anemias: Secondary | ICD-10-CM | POA: Diagnosis not present

## 2019-12-17 DIAGNOSIS — I10 Essential (primary) hypertension: Secondary | ICD-10-CM | POA: Diagnosis not present

## 2019-12-17 DIAGNOSIS — N400 Enlarged prostate without lower urinary tract symptoms: Secondary | ICD-10-CM | POA: Diagnosis not present

## 2019-12-17 DIAGNOSIS — E782 Mixed hyperlipidemia: Secondary | ICD-10-CM | POA: Diagnosis not present

## 2019-12-17 DIAGNOSIS — J449 Chronic obstructive pulmonary disease, unspecified: Secondary | ICD-10-CM | POA: Diagnosis not present

## 2019-12-20 DIAGNOSIS — H401121 Primary open-angle glaucoma, left eye, mild stage: Secondary | ICD-10-CM | POA: Diagnosis not present

## 2019-12-27 ENCOUNTER — Other Ambulatory Visit: Payer: Self-pay | Admitting: Cardiology

## 2019-12-27 NOTE — Telephone Encounter (Signed)
Rx refill sent to pharmacy. 

## 2019-12-31 DIAGNOSIS — J449 Chronic obstructive pulmonary disease, unspecified: Secondary | ICD-10-CM | POA: Diagnosis not present

## 2019-12-31 DIAGNOSIS — I5023 Acute on chronic systolic (congestive) heart failure: Secondary | ICD-10-CM | POA: Diagnosis not present

## 2019-12-31 DIAGNOSIS — E782 Mixed hyperlipidemia: Secondary | ICD-10-CM | POA: Diagnosis not present

## 2019-12-31 DIAGNOSIS — N400 Enlarged prostate without lower urinary tract symptoms: Secondary | ICD-10-CM | POA: Diagnosis not present

## 2019-12-31 DIAGNOSIS — D518 Other vitamin B12 deficiency anemias: Secondary | ICD-10-CM | POA: Diagnosis not present

## 2019-12-31 DIAGNOSIS — I480 Paroxysmal atrial fibrillation: Secondary | ICD-10-CM | POA: Diagnosis not present

## 2019-12-31 DIAGNOSIS — J441 Chronic obstructive pulmonary disease with (acute) exacerbation: Secondary | ICD-10-CM | POA: Diagnosis not present

## 2019-12-31 DIAGNOSIS — I10 Essential (primary) hypertension: Secondary | ICD-10-CM | POA: Diagnosis not present

## 2020-01-02 DIAGNOSIS — H401121 Primary open-angle glaucoma, left eye, mild stage: Secondary | ICD-10-CM | POA: Diagnosis not present

## 2020-02-01 DIAGNOSIS — J441 Chronic obstructive pulmonary disease with (acute) exacerbation: Secondary | ICD-10-CM | POA: Diagnosis not present

## 2020-02-01 DIAGNOSIS — I5023 Acute on chronic systolic (congestive) heart failure: Secondary | ICD-10-CM | POA: Diagnosis not present

## 2020-02-01 DIAGNOSIS — J449 Chronic obstructive pulmonary disease, unspecified: Secondary | ICD-10-CM | POA: Diagnosis not present

## 2020-02-01 DIAGNOSIS — N400 Enlarged prostate without lower urinary tract symptoms: Secondary | ICD-10-CM | POA: Diagnosis not present

## 2020-02-01 DIAGNOSIS — D518 Other vitamin B12 deficiency anemias: Secondary | ICD-10-CM | POA: Diagnosis not present

## 2020-02-01 DIAGNOSIS — I10 Essential (primary) hypertension: Secondary | ICD-10-CM | POA: Diagnosis not present

## 2020-02-01 DIAGNOSIS — I480 Paroxysmal atrial fibrillation: Secondary | ICD-10-CM | POA: Diagnosis not present

## 2020-02-01 DIAGNOSIS — E782 Mixed hyperlipidemia: Secondary | ICD-10-CM | POA: Diagnosis not present

## 2020-02-09 DIAGNOSIS — F17210 Nicotine dependence, cigarettes, uncomplicated: Secondary | ICD-10-CM | POA: Diagnosis not present

## 2020-02-09 DIAGNOSIS — J449 Chronic obstructive pulmonary disease, unspecified: Secondary | ICD-10-CM | POA: Diagnosis not present

## 2020-02-09 DIAGNOSIS — G9341 Metabolic encephalopathy: Secondary | ICD-10-CM | POA: Diagnosis not present

## 2020-02-09 DIAGNOSIS — E876 Hypokalemia: Secondary | ICD-10-CM | POA: Diagnosis not present

## 2020-02-09 DIAGNOSIS — Z7901 Long term (current) use of anticoagulants: Secondary | ICD-10-CM | POA: Diagnosis not present

## 2020-02-09 DIAGNOSIS — R197 Diarrhea, unspecified: Secondary | ICD-10-CM | POA: Diagnosis not present

## 2020-02-09 DIAGNOSIS — J9811 Atelectasis: Secondary | ICD-10-CM | POA: Diagnosis not present

## 2020-02-09 DIAGNOSIS — F1010 Alcohol abuse, uncomplicated: Secondary | ICD-10-CM | POA: Diagnosis not present

## 2020-02-09 DIAGNOSIS — J690 Pneumonitis due to inhalation of food and vomit: Secondary | ICD-10-CM | POA: Diagnosis not present

## 2020-02-09 DIAGNOSIS — M1990 Unspecified osteoarthritis, unspecified site: Secondary | ICD-10-CM | POA: Diagnosis not present

## 2020-02-09 DIAGNOSIS — R0902 Hypoxemia: Secondary | ICD-10-CM | POA: Diagnosis not present

## 2020-02-09 DIAGNOSIS — J9690 Respiratory failure, unspecified, unspecified whether with hypoxia or hypercapnia: Secondary | ICD-10-CM | POA: Diagnosis not present

## 2020-02-09 DIAGNOSIS — J90 Pleural effusion, not elsewhere classified: Secondary | ICD-10-CM | POA: Diagnosis not present

## 2020-02-09 DIAGNOSIS — R0602 Shortness of breath: Secondary | ICD-10-CM | POA: Diagnosis not present

## 2020-02-09 DIAGNOSIS — N23 Unspecified renal colic: Secondary | ICD-10-CM | POA: Diagnosis not present

## 2020-02-09 DIAGNOSIS — R509 Fever, unspecified: Secondary | ICD-10-CM | POA: Diagnosis not present

## 2020-02-09 DIAGNOSIS — R69 Illness, unspecified: Secondary | ICD-10-CM | POA: Diagnosis not present

## 2020-02-09 DIAGNOSIS — J9601 Acute respiratory failure with hypoxia: Secondary | ICD-10-CM | POA: Diagnosis not present

## 2020-02-09 DIAGNOSIS — R059 Cough, unspecified: Secondary | ICD-10-CM | POA: Diagnosis not present

## 2020-02-09 DIAGNOSIS — I10 Essential (primary) hypertension: Secondary | ICD-10-CM | POA: Diagnosis not present

## 2020-02-09 DIAGNOSIS — J9600 Acute respiratory failure, unspecified whether with hypoxia or hypercapnia: Secondary | ICD-10-CM | POA: Diagnosis not present

## 2020-02-09 DIAGNOSIS — B963 Hemophilus influenzae [H. influenzae] as the cause of diseases classified elsewhere: Secondary | ICD-10-CM | POA: Diagnosis not present

## 2020-02-09 DIAGNOSIS — Z79899 Other long term (current) drug therapy: Secondary | ICD-10-CM | POA: Diagnosis not present

## 2020-02-09 DIAGNOSIS — Z720 Tobacco use: Secondary | ICD-10-CM | POA: Diagnosis not present

## 2020-02-09 DIAGNOSIS — Z4682 Encounter for fitting and adjustment of non-vascular catheter: Secondary | ICD-10-CM | POA: Diagnosis not present

## 2020-02-09 DIAGNOSIS — Z9981 Dependence on supplemental oxygen: Secondary | ICD-10-CM | POA: Diagnosis not present

## 2020-02-09 DIAGNOSIS — J441 Chronic obstructive pulmonary disease with (acute) exacerbation: Secondary | ICD-10-CM | POA: Diagnosis not present

## 2020-02-09 DIAGNOSIS — E7800 Pure hypercholesterolemia, unspecified: Secondary | ICD-10-CM | POA: Diagnosis not present

## 2020-02-09 DIAGNOSIS — R531 Weakness: Secondary | ICD-10-CM | POA: Diagnosis not present

## 2020-02-09 DIAGNOSIS — R339 Retention of urine, unspecified: Secondary | ICD-10-CM | POA: Diagnosis not present

## 2020-02-09 DIAGNOSIS — E785 Hyperlipidemia, unspecified: Secondary | ICD-10-CM | POA: Diagnosis not present

## 2020-02-09 DIAGNOSIS — R42 Dizziness and giddiness: Secondary | ICD-10-CM | POA: Diagnosis not present

## 2020-02-09 DIAGNOSIS — I4891 Unspecified atrial fibrillation: Secondary | ICD-10-CM | POA: Diagnosis not present

## 2020-02-09 DIAGNOSIS — I714 Abdominal aortic aneurysm, without rupture: Secondary | ICD-10-CM | POA: Diagnosis not present

## 2020-02-09 DIAGNOSIS — N200 Calculus of kidney: Secondary | ICD-10-CM | POA: Diagnosis not present

## 2020-02-09 DIAGNOSIS — E871 Hypo-osmolality and hyponatremia: Secondary | ICD-10-CM | POA: Diagnosis not present

## 2020-02-12 DIAGNOSIS — J449 Chronic obstructive pulmonary disease, unspecified: Secondary | ICD-10-CM | POA: Diagnosis not present

## 2020-02-12 DIAGNOSIS — Z4682 Encounter for fitting and adjustment of non-vascular catheter: Secondary | ICD-10-CM | POA: Diagnosis not present

## 2020-02-12 DIAGNOSIS — J9601 Acute respiratory failure with hypoxia: Secondary | ICD-10-CM | POA: Diagnosis not present

## 2020-02-12 DIAGNOSIS — R69 Illness, unspecified: Secondary | ICD-10-CM | POA: Diagnosis not present

## 2020-02-12 DIAGNOSIS — J90 Pleural effusion, not elsewhere classified: Secondary | ICD-10-CM | POA: Diagnosis not present

## 2020-02-12 DIAGNOSIS — R509 Fever, unspecified: Secondary | ICD-10-CM | POA: Diagnosis not present

## 2020-02-13 DIAGNOSIS — J9690 Respiratory failure, unspecified, unspecified whether with hypoxia or hypercapnia: Secondary | ICD-10-CM | POA: Diagnosis not present

## 2020-02-14 DIAGNOSIS — J90 Pleural effusion, not elsewhere classified: Secondary | ICD-10-CM | POA: Diagnosis not present

## 2020-02-14 DIAGNOSIS — R0902 Hypoxemia: Secondary | ICD-10-CM | POA: Diagnosis not present

## 2020-02-14 DIAGNOSIS — I4891 Unspecified atrial fibrillation: Secondary | ICD-10-CM | POA: Diagnosis not present

## 2020-02-15 DIAGNOSIS — J9600 Acute respiratory failure, unspecified whether with hypoxia or hypercapnia: Secondary | ICD-10-CM | POA: Diagnosis not present

## 2020-02-15 DIAGNOSIS — J449 Chronic obstructive pulmonary disease, unspecified: Secondary | ICD-10-CM | POA: Diagnosis not present

## 2020-02-15 DIAGNOSIS — R0902 Hypoxemia: Secondary | ICD-10-CM | POA: Diagnosis not present

## 2020-02-15 DIAGNOSIS — I4891 Unspecified atrial fibrillation: Secondary | ICD-10-CM | POA: Diagnosis not present

## 2020-02-15 DIAGNOSIS — J90 Pleural effusion, not elsewhere classified: Secondary | ICD-10-CM | POA: Diagnosis not present

## 2020-02-15 DIAGNOSIS — Z9981 Dependence on supplemental oxygen: Secondary | ICD-10-CM | POA: Diagnosis not present

## 2020-02-15 DIAGNOSIS — E785 Hyperlipidemia, unspecified: Secondary | ICD-10-CM | POA: Diagnosis not present

## 2020-02-15 DIAGNOSIS — J690 Pneumonitis due to inhalation of food and vomit: Secondary | ICD-10-CM | POA: Diagnosis not present

## 2020-02-15 DIAGNOSIS — Z720 Tobacco use: Secondary | ICD-10-CM | POA: Diagnosis not present

## 2020-02-15 DIAGNOSIS — I10 Essential (primary) hypertension: Secondary | ICD-10-CM | POA: Diagnosis not present

## 2020-02-16 DIAGNOSIS — I4891 Unspecified atrial fibrillation: Secondary | ICD-10-CM | POA: Diagnosis not present

## 2020-02-16 DIAGNOSIS — J9690 Respiratory failure, unspecified, unspecified whether with hypoxia or hypercapnia: Secondary | ICD-10-CM | POA: Diagnosis not present

## 2020-02-16 DIAGNOSIS — J9811 Atelectasis: Secondary | ICD-10-CM | POA: Diagnosis not present

## 2020-02-16 DIAGNOSIS — F1010 Alcohol abuse, uncomplicated: Secondary | ICD-10-CM | POA: Diagnosis not present

## 2020-02-16 DIAGNOSIS — J690 Pneumonitis due to inhalation of food and vomit: Secondary | ICD-10-CM | POA: Diagnosis not present

## 2020-02-16 DIAGNOSIS — J90 Pleural effusion, not elsewhere classified: Secondary | ICD-10-CM | POA: Diagnosis not present

## 2020-02-16 DIAGNOSIS — Z720 Tobacco use: Secondary | ICD-10-CM | POA: Diagnosis not present

## 2020-02-17 DIAGNOSIS — I4891 Unspecified atrial fibrillation: Secondary | ICD-10-CM | POA: Diagnosis not present

## 2020-02-17 DIAGNOSIS — J690 Pneumonitis due to inhalation of food and vomit: Secondary | ICD-10-CM | POA: Diagnosis not present

## 2020-02-17 DIAGNOSIS — Z720 Tobacco use: Secondary | ICD-10-CM | POA: Diagnosis not present

## 2020-02-17 DIAGNOSIS — F1010 Alcohol abuse, uncomplicated: Secondary | ICD-10-CM | POA: Diagnosis not present

## 2020-02-18 DIAGNOSIS — F1010 Alcohol abuse, uncomplicated: Secondary | ICD-10-CM | POA: Diagnosis not present

## 2020-02-18 DIAGNOSIS — Z720 Tobacco use: Secondary | ICD-10-CM | POA: Diagnosis not present

## 2020-02-18 DIAGNOSIS — J9600 Acute respiratory failure, unspecified whether with hypoxia or hypercapnia: Secondary | ICD-10-CM | POA: Diagnosis not present

## 2020-02-18 DIAGNOSIS — J90 Pleural effusion, not elsewhere classified: Secondary | ICD-10-CM | POA: Diagnosis not present

## 2020-02-18 DIAGNOSIS — J449 Chronic obstructive pulmonary disease, unspecified: Secondary | ICD-10-CM | POA: Diagnosis not present

## 2020-02-18 DIAGNOSIS — Z9981 Dependence on supplemental oxygen: Secondary | ICD-10-CM | POA: Diagnosis not present

## 2020-02-18 DIAGNOSIS — I4891 Unspecified atrial fibrillation: Secondary | ICD-10-CM | POA: Diagnosis not present

## 2020-02-18 DIAGNOSIS — J690 Pneumonitis due to inhalation of food and vomit: Secondary | ICD-10-CM | POA: Diagnosis not present

## 2020-02-19 DIAGNOSIS — F1010 Alcohol abuse, uncomplicated: Secondary | ICD-10-CM | POA: Diagnosis not present

## 2020-02-19 DIAGNOSIS — J690 Pneumonitis due to inhalation of food and vomit: Secondary | ICD-10-CM | POA: Diagnosis not present

## 2020-02-19 DIAGNOSIS — Z720 Tobacco use: Secondary | ICD-10-CM | POA: Diagnosis not present

## 2020-02-19 DIAGNOSIS — I4891 Unspecified atrial fibrillation: Secondary | ICD-10-CM | POA: Diagnosis not present

## 2020-02-25 DIAGNOSIS — Z7901 Long term (current) use of anticoagulants: Secondary | ICD-10-CM | POA: Diagnosis not present

## 2020-02-25 DIAGNOSIS — J449 Chronic obstructive pulmonary disease, unspecified: Secondary | ICD-10-CM

## 2020-02-25 DIAGNOSIS — I4891 Unspecified atrial fibrillation: Secondary | ICD-10-CM | POA: Diagnosis not present

## 2020-02-26 DIAGNOSIS — J449 Chronic obstructive pulmonary disease, unspecified: Secondary | ICD-10-CM | POA: Diagnosis not present

## 2020-02-26 DIAGNOSIS — Z7901 Long term (current) use of anticoagulants: Secondary | ICD-10-CM | POA: Diagnosis not present

## 2020-02-26 DIAGNOSIS — I4891 Unspecified atrial fibrillation: Secondary | ICD-10-CM | POA: Diagnosis not present

## 2020-02-27 DIAGNOSIS — J449 Chronic obstructive pulmonary disease, unspecified: Secondary | ICD-10-CM | POA: Diagnosis not present

## 2020-02-27 DIAGNOSIS — I4891 Unspecified atrial fibrillation: Secondary | ICD-10-CM | POA: Diagnosis not present

## 2020-02-27 DIAGNOSIS — Z7901 Long term (current) use of anticoagulants: Secondary | ICD-10-CM | POA: Diagnosis not present

## 2020-02-28 DIAGNOSIS — J449 Chronic obstructive pulmonary disease, unspecified: Secondary | ICD-10-CM | POA: Diagnosis not present

## 2020-02-28 DIAGNOSIS — Z7901 Long term (current) use of anticoagulants: Secondary | ICD-10-CM | POA: Diagnosis not present

## 2020-02-28 DIAGNOSIS — I4891 Unspecified atrial fibrillation: Secondary | ICD-10-CM | POA: Diagnosis not present

## 2020-02-29 DIAGNOSIS — J449 Chronic obstructive pulmonary disease, unspecified: Secondary | ICD-10-CM | POA: Diagnosis not present

## 2020-02-29 DIAGNOSIS — I4891 Unspecified atrial fibrillation: Secondary | ICD-10-CM | POA: Diagnosis not present

## 2020-02-29 DIAGNOSIS — Z7901 Long term (current) use of anticoagulants: Secondary | ICD-10-CM | POA: Diagnosis not present

## 2020-03-31 DIAGNOSIS — Z7951 Long term (current) use of inhaled steroids: Secondary | ICD-10-CM | POA: Insufficient documentation

## 2020-03-31 DIAGNOSIS — M1990 Unspecified osteoarthritis, unspecified site: Secondary | ICD-10-CM | POA: Insufficient documentation

## 2020-03-31 DIAGNOSIS — K219 Gastro-esophageal reflux disease without esophagitis: Secondary | ICD-10-CM | POA: Insufficient documentation

## 2020-03-31 DIAGNOSIS — Z87442 Personal history of urinary calculi: Secondary | ICD-10-CM | POA: Insufficient documentation

## 2020-03-31 DIAGNOSIS — I10 Essential (primary) hypertension: Secondary | ICD-10-CM | POA: Insufficient documentation

## 2020-03-31 DIAGNOSIS — I499 Cardiac arrhythmia, unspecified: Secondary | ICD-10-CM | POA: Insufficient documentation

## 2020-03-31 DIAGNOSIS — R0600 Dyspnea, unspecified: Secondary | ICD-10-CM | POA: Insufficient documentation

## 2020-03-31 DIAGNOSIS — J449 Chronic obstructive pulmonary disease, unspecified: Secondary | ICD-10-CM | POA: Insufficient documentation

## 2020-03-31 DIAGNOSIS — I4891 Unspecified atrial fibrillation: Secondary | ICD-10-CM | POA: Insufficient documentation

## 2020-04-08 NOTE — Progress Notes (Deleted)
Cardiology Office Note:    Date:  04/08/2020   ID:  Kyle Farley, DOB 1941/01/13, MRN [id number]  PCP:  Zoila Shutter, NP  Cardiologist:  Shirlee More, MD    Referring MD: Zoila Shutter, NP    ASSESSMENT:    No diagnosis found. PLAN:    In order of problems listed above:  1. ***   Next appointment: ***   Medication Adjustments/Labs and Tests Ordered: Current medicines are reviewed at length with the patient today.  Concerns regarding medicines are outlined above.  No orders of the defined types were placed in this encounter.  No orders of the defined types were placed in this encounter.   No chief complaint on file.   History of Present Illness:    Kyle Farley is a 80 y.o. 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  He also has a small 3 cm abdominal aortic aneurysm evaluated [facility] vascular surgery.  He was last seen 10/10/2019. Compliance with diet, lifestyle and medications: ***  He was admitted to [facility] 02/09/2020 he presented with hyponatremia in the setting of alcohol abuse and alcohol withdrawal syndrome he had respiratory failure with hypoxia requiring intubation and mechanical ventilation and was found to have pneumonia.  In the critical care setting his heart rate was rapid despite treatment with Cardizem metoprolol digoxin and required amiodarone for heart rate control.  In hospital he subsequently converted to sinus rhythm and he was anticoagulated for stroke prophylaxis.  His discharge serum sodium was 135 creatinine 0.60 hemoglobin 9.9 Citic he had a low magnesium during the hospitalization Past Medical History:  Diagnosis Date  . Arthritis   . CHF (congestive heart failure) ([place])   . Cigarette smoker 10/21/2014  . COPD  (chronic obstructive pulmonary disease) ([place])   . COPD exacerbation ([place]) 01/24/2017  . Dyspnea   . Dysrhythmia    atrial fibrillation  . Essential hypertension 10/21/2014  . GERD (gastroesophageal reflux disease)   . History of kidney stones   . Hyperlipidemia 01/24/2017  . Hypertension   . Kidney stones 01/24/2017  . Paroxysmal atrial fibrillation ([place]) 10/21/2014    Past Surgical History:  Procedure Laterality Date  . LEG SURGERY Right    steal pin placed in right leg 35 years ago    Current Medications: No outpatient medications have been marked as taking for the 04/09/20 encounter (Appointment) with Richardo Priest, MD.     Allergies:   Patient has no known allergies.   Social History   Socioeconomic History  . Marital status: Married    Spouse name: Not on file  . Number of children: Not on file  . Years of education: Not on file  . Highest education level: Not on file  Occupational History  . Not on file  Tobacco Use  . Smoking status: Current Every Day Smoker    Packs/day: 1.00    Years: 62.00    Pack years: 62.00    Types: Cigarettes, Cigars  . Smokeless tobacco: Never Used  Vaping Use  . Vaping Use: Never used  Substance and Sexual Activity  . Alcohol use: Yes    Alcohol/week: 5.0 standard drinks    Types: 5 Standard drinks or equivalent per week    Comment: 5th of rum a week or 1 drink a day  .  Drug use: No  . Sexual activity: Not Currently  Other Topics Concern  . Not on file  Social History Narrative  . Not on file   Social Determinants of Health   Financial Resource Strain: Not on file  Food Insecurity: Not on file  Transportation Needs: Not on file  Physical Activity: Not on file  Stress: Not on file  Social Connections: Not on file     Family History: The patient's ***family history includes Breast cancer in his sister; Cancer in his mother; Diabetes in his mother. ROS:   Please see the history of present illness.    All other systems  reviewed and are negative.  EKGs/Labs/Other Studies Reviewed:    The following studies were reviewed today:  EKG:  EKG ordered today and personally reviewed.  The ekg ordered today demonstrates ***  Recent Labs: No results found for requested labs within last 8760 hours.  Recent Lipid Panel No results found for: CHOL, TRIG, HDL, CHOLHDL, VLDL, LDLCALC, LDLDIRECT  Physical Exam:    VS:  There were no vitals taken for this visit.    Wt Readings from Last 3 Encounters:  10/10/19 156 lb (70.8 kg)  04/12/19 164 lb (74.4 kg)  03/26/19 166 lb 3.2 oz (75.4 kg)     GEN: *** Well nourished, well developed in no acute distress HEENT: Normal NECK: No JVD; No carotid bruits LYMPHATICS: No lymphadenopathy CARDIAC: ***RRR, no murmurs, rubs, gallops RESPIRATORY:  Clear to auscultation without rales, wheezing or rhonchi  ABDOMEN: Soft, non-tender, non-distended MUSCULOSKELETAL:  No edema; No deformity  SKIN: Warm and dry NEUROLOGIC:  Alert and oriented x 3 PSYCHIATRIC:  Normal affect    Signed, Shirlee More, MD  04/08/2020 3:59 PM    [facility]

## 2020-04-09 ENCOUNTER — Ambulatory Visit: Payer: Medicare Other | Admitting: Cardiology

## 2020-04-25 ENCOUNTER — Ambulatory Visit: Payer: Medicare Other | Admitting: Cardiology

## 2020-04-25 NOTE — Progress Notes (Deleted)
Cardiology Office Note:    Date:  04/25/2020   ID:  GANNON HEINZMAN, DOB Oct 09, 1940, MRN [id number]  PCP:  Zoila Shutter, NP  Cardiologist:  Shirlee More, MD    Referring MD: Zoila Shutter, NP    ASSESSMENT:    1. Paroxysmal atrial fibrillation (HCC)   2. High risk medication use   3. Hypertensive heart disease with heart failure ([place])   4. Chronic obstructive pulmonary disease, unspecified COPD type ([place])    PLAN:    In order of problems listed above:  1. ***   Next appointment: ***   Medication Adjustments/Labs and Tests Ordered: Current medicines are reviewed at length with the patient today.  Concerns regarding medicines are outlined above.  No orders of the defined types were placed in this encounter.  No orders of the defined types were placed in this encounter.   No chief complaint on file.   History of Present Illness:    Kyle Farley is a 80 y.o. male with a hx of paroxysmal atrial fibrillation controlled with flecainide and rate slowing calcium channel blocker to avoid his previous one-to-one conduction of atrial flutter hypertensive heart disease with heart failure and COPD.  He was recently admitted to [facility] health 02/09/2020 to 02/29/2020 with recurrent atrial arrhythmia noncompliance with medications respiratory failure requiring intubation and ventilation associated with hyponatremia and alcohol withdrawal syndrome.  He stabilized initially for rate control was put on amiodarone and subsequently resumed sinus rhythm he was continued on amiodarone discharge 200 mg twice daily as well as anticoagulant was encouraged to take his medications and to be compliant with follow-up. Compliance with diet, lifestyle and medications: ***  Laboratory studies prior to discharge showed mild anemia hemoglobin 9.9 platelets 279,000 sodium 135 he had severe hyponatremia due to alcohol abuse on presentation creatinine 0.60. He had a chest CT performed showing aortic  atherosclerosis as well as coronary artery calcification and lipomatous hypertrophy of the atrial septum Past Medical History:  Diagnosis Date  . Arthritis   . CHF (congestive heart failure) ([place])   . Cigarette smoker 10/21/2014  . COPD (chronic obstructive pulmonary disease) ([place])   . COPD exacerbation ([place]) 01/24/2017  . Dyspnea   . Dysrhythmia    atrial fibrillation  . Essential hypertension 10/21/2014  . GERD (gastroesophageal reflux disease)   . History of kidney stones   . Hyperlipidemia 01/24/2017  . Hypertension   . Kidney stones 01/24/2017  . Paroxysmal atrial fibrillation ([place]) 10/21/2014    Past Surgical History:  Procedure Laterality Date  . LEG SURGERY Right    steal pin placed in right leg 35 years ago    Current Medications: No outpatient medications have been marked as taking for the 04/25/20 encounter (Appointment) with Richardo Priest, MD.     Allergies:   Patient has no known allergies.   Social History   Socioeconomic History  . Marital status: Married    Spouse name: Not on file  . Number of children: Not on file  . Years of education: Not on file  . Highest education level: Not on file  Occupational History  . Not on file  Tobacco Use  . Smoking status: Current Every Day Smoker    Packs/day: 1.00    Years: 62.00    Pack years: 62.00    Types: Cigarettes, Cigars  . Smokeless tobacco: Never Used  Vaping Use  . Vaping Use: Never used  Substance and Sexual Activity  . Alcohol use: Yes  Alcohol/week: 5.0 standard drinks    Types: 5 Standard drinks or equivalent per week    Comment: 5th of rum a week or 1 drink a day  . Drug use: No  . Sexual activity: Not Currently  Other Topics Concern  . Not on file  Social History Narrative  . Not on file   Social Determinants of Health   Financial Resource Strain: Not on file  Food Insecurity: Not on file  Transportation Needs: Not on file  Physical Activity: Not on file  Stress: Not on file  Social  Connections: Not on file     Family History: The patient's ***family history includes Breast cancer in his sister; Cancer in his mother; Diabetes in his mother. ROS:   Please see the history of present illness.    All other systems reviewed and are negative.  EKGs/Labs/Other Studies Reviewed:    The following studies were reviewed today:  EKG:  EKG ordered today and personally reviewed.  The ekg ordered today demonstrates ***  Recent Labs: No results found for requested labs within last 8760 hours.  Recent Lipid Panel No results found for: CHOL, TRIG, HDL, CHOLHDL, VLDL, LDLCALC, LDLDIRECT  Physical Exam:    VS:  There were no vitals taken for this visit.    Wt Readings from Last 3 Encounters:  10/10/19 156 lb (70.8 kg)  04/12/19 164 lb (74.4 kg)  03/26/19 166 lb 3.2 oz (75.4 kg)     GEN: *** Well nourished, well developed in no acute distress HEENT: Normal NECK: No JVD; No carotid bruits LYMPHATICS: No lymphadenopathy CARDIAC: ***RRR, no murmurs, rubs, gallops RESPIRATORY:  Clear to auscultation without rales, wheezing or rhonchi  ABDOMEN: Soft, non-tender, non-distended MUSCULOSKELETAL:  No edema; No deformity  SKIN: Warm and dry NEUROLOGIC:  Alert and oriented x 3 PSYCHIATRIC:  Normal affect    Signed, Shirlee More, MD  04/25/2020 12:09 PM    [place]

## 2020-05-30 ENCOUNTER — Encounter: Payer: Self-pay | Admitting: Cardiology

## 2020-05-30 ENCOUNTER — Telehealth: Payer: Self-pay | Admitting: Cardiology

## 2020-05-30 NOTE — Telephone Encounter (Signed)
Left message on Tiffany's voicemail to please return our call.

## 2020-05-30 NOTE — Telephone Encounter (Signed)
Spoke with Kyle Farley just now and let her know Dr. Joya Gaskins recommendations. She verbalizes understanding and thanks me for the call back.

## 2020-05-30 NOTE — Telephone Encounter (Signed)
Pt c/o BP issue: STAT if pt c/o blurred vision, one-sided weakness or slurred speech  1. What are your last 5 BP readings?  Per Tiffany, BPs are ranging 80's/40's and 90's/60's  2. Are you having any other symptoms (ex. Dizziness, headache, blurred vision, passed out)?  No   3. What is your BP issue?  Tiffany with [facility] states the patient's BP has been extremely low. She would also like to discuss medications he was recently put on while admitted.

## 2020-05-30 NOTE — Telephone Encounter (Signed)
He has been no-show on 2 occasions in my office recently  I will direct him to his primary care doctor as I do not think I am managing his hypertension.

## 2020-06-20 ENCOUNTER — Other Ambulatory Visit: Payer: Self-pay

## 2020-06-20 MED ORDER — METOPROLOL SUCCINATE ER 25 MG PO TB24
12.5000 mg | ORAL_TABLET | Freq: Two times a day (BID) | ORAL | 2 refills | Status: DC
Start: 2020-06-20 — End: 2020-09-20

## 2020-06-20 NOTE — Telephone Encounter (Signed)
Rx approved and sent using Dr. Harriet Masson last note instructions.

## 2020-07-04 ENCOUNTER — Ambulatory Visit: Payer: Medicare Other | Admitting: Cardiology

## 2020-07-04 NOTE — Progress Notes (Deleted)
Cardiology Office Note:    Date:  07/04/2020   ID:  RASHAWN RAYMAN, DOB 05/05/1940, MRN [id number]  PCP:  Zoila Shutter, NP  Cardiologist:  Shirlee More, MD    Referring MD: Zoila Shutter, NP    ASSESSMENT:    No diagnosis found. PLAN:    In order of problems listed above:     Next appointment: ***   Medication Adjustments/Labs and Tests Ordered: Current medicines are reviewed at length with the patient today.  Concerns regarding medicines are outlined above.  No orders of the defined types were placed in this encounter.  No orders of the defined types were placed in this encounter.   No chief complaint on file.   History of Present Illness:    Kyle Farley is a 80 y.o. male with a hx of atrial fibrillation heart failure right bundle branch block last seen February 2022.  Compliance with diet, lifestyle and medications: ***  He was seen at [facility] ED 05/18/2020 after recent discharge from the hospital with heart failure he felt weak Home blood pressure was in the 70s came to the emergency room blood pressure recorded 93/53 and was given 500 cc of normal saline.  Hemoglobin was 11.8 white count was normal potassium 3.6 sodium 135 creatinine 1.0 GFR greater than 60 cc proBNP level was not significantly elevated at 489.  He was subsequently admitted to the hospital and discharged the next day.  He was discharged on reduced dose of his furosemide ACE inhibitor was discontinued along with his beta-blocker.  He was continued on his anticoagulant.  Blood pressure at discharge 103/66 and he is ambulated without weakness.  He has EKG independently reviewed 05/21/2020 shows atrial fibrillation rate 82 bpm right bundle branch block chest x-ray showed no active disease. He was seen again in the emergency room 05/21/2020 after a fall associated alcohol consumption.  When seen by EMS his initial blood pressure was less than 90 systolic.  His blood pressure in the ED was  recorded in 12 122/74 hemoglobin was 10.7 creatinine 1.10 EKG was stable with atrial fibrillation he had CT scan of the head and cervical spine with no acute injury was discharged from the hospital with a notation that they spoke to cardiology Dr. Agustin Cree recommended stopping diuretic therapy. He was seen during admission to [facility] January 2022 for shortness of breath associated with severe hyponatremia with serum sodium of 112 and rapid atrial fibrillation last seen that admission 02/29/2020 at which time his atrial fibrillation was rate controlled and he is continued on his anticoagulant. Past Medical History:  Diagnosis Date   Arthritis    CHF (congestive heart failure) ([place])    Cigarette smoker 10/21/2014   COPD (chronic obstructive pulmonary disease) (HCC)    COPD exacerbation ([place]) 01/24/2017   Dyspnea    Dysrhythmia    atrial fibrillation   Essential hypertension 10/21/2014   GERD (gastroesophageal reflux disease)    History of kidney stones    Hyperlipidemia 01/24/2017   Hypertension    Kidney stones 01/24/2017   Paroxysmal atrial fibrillation ([place]) 10/21/2014    Past Surgical History:  Procedure Laterality Date   LEG SURGERY Right    steal pin placed in right leg 35 years ago    Current Medications: No outpatient medications have been marked as taking for the 07/04/20 encounter (Appointment) with Richardo Priest, MD.     Allergies:   Patient has no known allergies.   Social History  Socioeconomic History   Marital status: Married    Spouse name: Not on file   Number of children: Not on file   Years of education: Not on file   Highest education level: Not on file  Occupational History   Not on file  Tobacco Use   Smoking status: Every Day    Packs/day: 1.00    Years: 62.00    Pack years: 62.00    Types: Cigarettes, Cigars   Smokeless tobacco: Never  Vaping Use   Vaping Use: Never used  Substance and Sexual Activity   Alcohol use: Yes    Alcohol/week:  5.0 standard drinks    Types: 5 Standard drinks or equivalent per week    Comment: 5th of rum a week or 1 drink a day   Drug use: No   Sexual activity: Not Currently  Other Topics Concern   Not on file  Social History Narrative   Not on file   Social Determinants of Health   Financial Resource Strain: Not on file  Food Insecurity: Not on file  Transportation Needs: Not on file  Physical Activity: Not on file  Stress: Not on file  Social Connections: Not on file     Family History: The patient's ***family history includes Breast cancer in his sister; Cancer in his mother; Diabetes in his mother. ROS:   Please see the history of present illness.    All other systems reviewed and are negative.  EKGs/Labs/Other Studies Reviewed:    The following studies were reviewed today:  EKG:  EKG ordered today and personally reviewed.  The ekg ordered today demonstrates ***  Recent Labs: No results found for requested labs within last 8760 hours.  Recent Lipid Panel No results found for: CHOL, TRIG, HDL, CHOLHDL, VLDL, LDLCALC, LDLDIRECT  Physical Exam:    VS:  There were no vitals taken for this visit.    Wt Readings from Last 3 Encounters:  10/10/19 156 lb (70.8 kg)  04/12/19 164 lb (74.4 kg)  03/26/19 166 lb 3.2 oz (75.4 kg)     GEN: *** Well nourished, well developed in no acute distress HEENT: Normal NECK: No JVD; No carotid bruits LYMPHATICS: No lymphadenopathy CARDIAC: ***RRR, no murmurs, rubs, gallops RESPIRATORY:  Clear to auscultation without rales, wheezing or rhonchi  ABDOMEN: Soft, non-tender, non-distended MUSCULOSKELETAL:  No edema; No deformity  SKIN: Warm and dry NEUROLOGIC:  Alert and oriented x 3 PSYCHIATRIC:  Normal affect    Signed, Shirlee More, MD  07/04/2020 11:17 AM    [name]

## 2020-07-29 ENCOUNTER — Ambulatory Visit: Payer: Medicare Other | Admitting: Cardiology

## 2020-09-12 ENCOUNTER — Emergency Department (HOSPITAL_COMMUNITY): Payer: Medicare Other

## 2020-09-12 ENCOUNTER — Other Ambulatory Visit: Payer: Self-pay

## 2020-09-12 ENCOUNTER — Encounter (HOSPITAL_COMMUNITY): Payer: Self-pay | Admitting: Family Medicine

## 2020-09-12 ENCOUNTER — Inpatient Hospital Stay (HOSPITAL_COMMUNITY)
Admission: EM | Admit: 2020-09-12 | Discharge: 2020-09-20 | DRG: 956 | Disposition: A | Discharge status: Home Health | Payer: Medicare Other | Attending: Family Medicine | Admitting: Family Medicine

## 2020-09-12 ENCOUNTER — Inpatient Hospital Stay (HOSPITAL_COMMUNITY): Payer: Medicare Other

## 2020-09-12 DIAGNOSIS — I480 Paroxysmal atrial fibrillation: Secondary | ICD-10-CM | POA: Diagnosis present

## 2020-09-12 DIAGNOSIS — I420 Dilated cardiomyopathy: Secondary | ICD-10-CM | POA: Diagnosis present

## 2020-09-12 DIAGNOSIS — N400 Enlarged prostate without lower urinary tract symptoms: Secondary | ICD-10-CM | POA: Diagnosis present

## 2020-09-12 DIAGNOSIS — L8990 Pressure ulcer of unspecified site, unspecified stage: Secondary | ICD-10-CM | POA: Diagnosis present

## 2020-09-12 DIAGNOSIS — Z7951 Long term (current) use of inhaled steroids: Secondary | ICD-10-CM

## 2020-09-12 DIAGNOSIS — M9701XA Periprosthetic fracture around internal prosthetic right hip joint, initial encounter: Secondary | ICD-10-CM | POA: Diagnosis present

## 2020-09-12 DIAGNOSIS — K5900 Constipation, unspecified: Secondary | ICD-10-CM | POA: Diagnosis not present

## 2020-09-12 DIAGNOSIS — I10 Essential (primary) hypertension: Secondary | ICD-10-CM | POA: Diagnosis present

## 2020-09-12 DIAGNOSIS — I2510 Atherosclerotic heart disease of native coronary artery without angina pectoris: Secondary | ICD-10-CM | POA: Diagnosis present

## 2020-09-12 DIAGNOSIS — I4510 Unspecified right bundle-branch block: Secondary | ICD-10-CM | POA: Diagnosis present

## 2020-09-12 DIAGNOSIS — I4819 Other persistent atrial fibrillation: Secondary | ICD-10-CM | POA: Diagnosis present

## 2020-09-12 DIAGNOSIS — E785 Hyperlipidemia, unspecified: Secondary | ICD-10-CM | POA: Diagnosis present

## 2020-09-12 DIAGNOSIS — I110 Hypertensive heart disease with heart failure: Secondary | ICD-10-CM | POA: Diagnosis not present

## 2020-09-12 DIAGNOSIS — I5022 Chronic systolic (congestive) heart failure: Secondary | ICD-10-CM | POA: Diagnosis not present

## 2020-09-12 DIAGNOSIS — D62 Acute posthemorrhagic anemia: Secondary | ICD-10-CM | POA: Diagnosis not present

## 2020-09-12 DIAGNOSIS — Z7289 Other problems related to lifestyle: Secondary | ICD-10-CM

## 2020-09-12 DIAGNOSIS — Z20822 Contact with and (suspected) exposure to covid-19: Secondary | ICD-10-CM | POA: Diagnosis present

## 2020-09-12 DIAGNOSIS — S72001A Fracture of unspecified part of neck of right femur, initial encounter for closed fracture: Principal | ICD-10-CM | POA: Diagnosis present

## 2020-09-12 DIAGNOSIS — K219 Gastro-esophageal reflux disease without esophagitis: Secondary | ICD-10-CM | POA: Diagnosis present

## 2020-09-12 DIAGNOSIS — Z79899 Other long term (current) drug therapy: Secondary | ICD-10-CM | POA: Diagnosis not present

## 2020-09-12 DIAGNOSIS — K769 Liver disease, unspecified: Secondary | ICD-10-CM | POA: Diagnosis present

## 2020-09-12 DIAGNOSIS — J449 Chronic obstructive pulmonary disease, unspecified: Secondary | ICD-10-CM | POA: Diagnosis not present

## 2020-09-12 DIAGNOSIS — I509 Heart failure, unspecified: Secondary | ICD-10-CM

## 2020-09-12 DIAGNOSIS — S7291XD Unspecified fracture of right femur, subsequent encounter for closed fracture with routine healing: Secondary | ICD-10-CM | POA: Diagnosis not present

## 2020-09-12 DIAGNOSIS — F1010 Alcohol abuse, uncomplicated: Secondary | ICD-10-CM | POA: Diagnosis present

## 2020-09-12 DIAGNOSIS — Z79890 Hormone replacement therapy (postmenopausal): Secondary | ICD-10-CM

## 2020-09-12 DIAGNOSIS — W010XXA Fall on same level from slipping, tripping and stumbling without subsequent striking against object, initial encounter: Secondary | ICD-10-CM | POA: Diagnosis present

## 2020-09-12 DIAGNOSIS — I482 Chronic atrial fibrillation, unspecified: Secondary | ICD-10-CM | POA: Diagnosis not present

## 2020-09-12 DIAGNOSIS — R079 Chest pain, unspecified: Secondary | ICD-10-CM | POA: Diagnosis not present

## 2020-09-12 DIAGNOSIS — E039 Hypothyroidism, unspecified: Secondary | ICD-10-CM | POA: Diagnosis present

## 2020-09-12 DIAGNOSIS — Z789 Other specified health status: Secondary | ICD-10-CM

## 2020-09-12 DIAGNOSIS — S32591A Other specified fracture of right pubis, initial encounter for closed fracture: Secondary | ICD-10-CM | POA: Diagnosis present

## 2020-09-12 DIAGNOSIS — E46 Unspecified protein-calorie malnutrition: Secondary | ICD-10-CM | POA: Diagnosis not present

## 2020-09-12 DIAGNOSIS — Z7901 Long term (current) use of anticoagulants: Secondary | ICD-10-CM

## 2020-09-12 DIAGNOSIS — R946 Abnormal results of thyroid function studies: Secondary | ICD-10-CM | POA: Diagnosis present

## 2020-09-12 DIAGNOSIS — F17210 Nicotine dependence, cigarettes, uncomplicated: Secondary | ICD-10-CM | POA: Diagnosis present

## 2020-09-12 DIAGNOSIS — F1090 Alcohol use, unspecified, uncomplicated: Secondary | ICD-10-CM

## 2020-09-12 DIAGNOSIS — T148XXA Other injury of unspecified body region, initial encounter: Secondary | ICD-10-CM

## 2020-09-12 DIAGNOSIS — Y92009 Unspecified place in unspecified non-institutional (private) residence as the place of occurrence of the external cause: Secondary | ICD-10-CM

## 2020-09-12 DIAGNOSIS — Z6825 Body mass index (BMI) 25.0-25.9, adult: Secondary | ICD-10-CM

## 2020-09-12 DIAGNOSIS — S7290XA Unspecified fracture of unspecified femur, initial encounter for closed fracture: Secondary | ICD-10-CM

## 2020-09-12 DIAGNOSIS — Z419 Encounter for procedure for purposes other than remedying health state, unspecified: Secondary | ICD-10-CM

## 2020-09-12 DIAGNOSIS — I5021 Acute systolic (congestive) heart failure: Secondary | ICD-10-CM | POA: Diagnosis not present

## 2020-09-12 DIAGNOSIS — I959 Hypotension, unspecified: Secondary | ICD-10-CM | POA: Diagnosis not present

## 2020-09-12 DIAGNOSIS — G479 Sleep disorder, unspecified: Secondary | ICD-10-CM | POA: Diagnosis not present

## 2020-09-12 DIAGNOSIS — E871 Hypo-osmolality and hyponatremia: Secondary | ICD-10-CM | POA: Diagnosis not present

## 2020-09-12 DIAGNOSIS — S7291XA Unspecified fracture of right femur, initial encounter for closed fracture: Secondary | ICD-10-CM

## 2020-09-12 DIAGNOSIS — I25119 Atherosclerotic heart disease of native coronary artery with unspecified angina pectoris: Secondary | ICD-10-CM | POA: Diagnosis not present

## 2020-09-12 HISTORY — DX: Unspecified fracture of right femur, initial encounter for closed fracture: S72.91XA

## 2020-09-12 HISTORY — DX: Other specified health status: Z78.9

## 2020-09-12 HISTORY — DX: Alcohol use, unspecified, uncomplicated: F10.90

## 2020-09-12 LAB — BASIC METABOLIC PANEL
Anion gap: 8 (ref 5–15)
Anion gap: 6 (ref 5–15)
BUN: 7 mg/dL — ABNORMAL LOW (ref 8–23)
BUN: 8 mg/dL (ref 8–23)
CO2: 21 mmol/L — ABNORMAL LOW (ref 22–32)
CO2: 28 mmol/L (ref 22–32)
Calcium: 6.9 mg/dL — ABNORMAL LOW (ref 8.9–10.3)
Calcium: 8.1 mg/dL — ABNORMAL LOW (ref 8.9–10.3)
Chloride: 99 mmol/L (ref 98–111)
Chloride: 93 mmol/L — ABNORMAL LOW (ref 98–111)
Creatinine, Ser: 0.78 mg/dL (ref 0.61–1.24)
Creatinine, Ser: 0.9 mg/dL (ref 0.61–1.24)
GFR, Estimated: >60 mL/min (ref >60)
GFR, Estimated: >60 mL/min (ref >60)
Glucose, Bld: 81 mg/dL (ref 70–99)
Glucose, Bld: 94 mg/dL (ref 70–99)
Potassium: 3.6 mmol/L (ref 3.5–5.1)
Potassium: 4.1 mmol/L (ref 3.5–5.1)
Sodium: 128 mmol/L — ABNORMAL LOW (ref 135–145)
Sodium: 127 mmol/L — ABNORMAL LOW (ref 135–145)

## 2020-09-12 LAB — RESP PANEL BY RT-PCR (FLU A&B, COVID) ARPGX2
Influenza A by PCR: NEGATIVE
Influenza B by PCR: NEGATIVE
SARS Coronavirus 2 by RT PCR: NEGATIVE

## 2020-09-12 LAB — CBC
HCT: 27.4 % — ABNORMAL LOW (ref 39.0–52.0)
HCT: 27.1 % — ABNORMAL LOW (ref 39.0–52.0)
Hemoglobin: 9 g/dL — ABNORMAL LOW (ref 13.0–17.0)
Hemoglobin: 9.1 g/dL — ABNORMAL LOW (ref 13.0–17.0)
MCH: 29.7 pg (ref 26.0–34.0)
MCH: 29.5 pg (ref 26.0–34.0)
MCHC: 32.8 g/dL (ref 30.0–36.0)
MCHC: 33.6 g/dL (ref 30.0–36.0)
MCV: 90.4 fL (ref 80.0–100.0)
MCV: 88 fL (ref 80.0–100.0)
Platelets: 174 10*3/uL (ref 150–400)
Platelets: 196 10*3/uL (ref 150–400)
RBC: 3.03 MIL/uL — ABNORMAL LOW (ref 4.22–5.81)
RBC: 3.08 MIL/uL — ABNORMAL LOW (ref 4.22–5.81)
RDW: 15.6 % — ABNORMAL HIGH (ref 11.5–15.5)
RDW: 15.5 % (ref 11.5–15.5)
WBC: 8.2 10*3/uL (ref 4.0–10.5)
WBC: 9.9 10*3/uL (ref 4.0–10.5)
nRBC: 0 % (ref 0.0–0.2)
nRBC: 0 % (ref 0.0–0.2)

## 2020-09-12 LAB — ABO/RH: ABO/RH(D): A POS

## 2020-09-12 LAB — TSH: TSH: 43.49 u[IU]/mL — ABNORMAL HIGH (ref 0.350–4.500)

## 2020-09-12 MED ORDER — DILTIAZEM HCL 30 MG PO TABS
60.0000 mg | ORAL_TABLET | Freq: Four times a day (QID) | ORAL | Status: DC
  Administered 2020-09-12: 60 mg via ORAL
  Filled 2020-09-12 (×2): qty 1
  Filled 2020-09-12: qty 2

## 2020-09-12 MED ORDER — MORPHINE SULFATE (PF) 4 MG/ML IV SOLN
5.0000 mg | INTRAVENOUS | Status: DC | PRN
  Administered 2020-09-15: 5 mg via INTRAVENOUS
  Filled 2020-09-12: qty 2

## 2020-09-12 MED ORDER — FUROSEMIDE 20 MG PO TABS: 10.0000 mg | ORAL_TABLET | ORAL | Status: DC

## 2020-09-12 MED ORDER — HYDROMORPHONE HCL 1 MG/ML IJ SOLN
1.0000 mg | Freq: Once | INTRAMUSCULAR | Status: AC
  Administered 2020-09-12: 1 mg via INTRAVENOUS
  Filled 2020-09-12: qty 1

## 2020-09-12 MED ORDER — UMECLIDINIUM BROMIDE 62.5 MCG/INH IN AEPB
1.0000 | INHALATION_SPRAY | Freq: Every day | RESPIRATORY_TRACT | Status: DC
  Administered 2020-09-13 – 2020-09-19 (×6): 1 via RESPIRATORY_TRACT
  Filled 2020-09-12: qty 7

## 2020-09-12 MED ORDER — HEPARIN SODIUM (PORCINE) 5000 UNIT/ML IJ SOLN
5000.0000 [IU] | Freq: Three times a day (TID) | INTRAMUSCULAR | Status: DC
  Administered 2020-09-12 – 2020-09-13 (×3): 5000 [IU] via SUBCUTANEOUS
  Filled 2020-09-12 (×3): qty 1

## 2020-09-12 MED ORDER — TAMSULOSIN HCL 0.4 MG PO CAPS
0.4000 mg | ORAL_CAPSULE | Freq: Every day | ORAL | Status: DC
  Administered 2020-09-13 – 2020-09-20 (×7): 0.4 mg via ORAL
  Filled 2020-09-12 (×7): qty 1

## 2020-09-12 MED ORDER — ALBUTEROL SULFATE (2.5 MG/3ML) 0.083% IN NEBU
3.0000 mL | INHALATION_SOLUTION | RESPIRATORY_TRACT | Status: DC | PRN
  Administered 2020-09-13 – 2020-09-16 (×3): 3 mL via RESPIRATORY_TRACT
  Filled 2020-09-12 (×3): qty 3

## 2020-09-12 MED ORDER — ACETAMINOPHEN 325 MG PO TABS
650.0000 mg | ORAL_TABLET | ORAL | Status: DC
  Administered 2020-09-12 – 2020-09-14 (×11): 650 mg via ORAL
  Filled 2020-09-12 (×11): qty 2

## 2020-09-12 MED ORDER — PANTOPRAZOLE SODIUM 40 MG PO TBEC
40.0000 mg | DELAYED_RELEASE_TABLET | Freq: Every day | ORAL | Status: DC
  Administered 2020-09-13 – 2020-09-20 (×7): 40 mg via ORAL
  Filled 2020-09-12 (×7): qty 1

## 2020-09-12 MED ORDER — OXYCODONE HCL 5 MG PO TABS
5.0000 mg | ORAL_TABLET | ORAL | Status: DC | PRN
Start: 2020-09-12 — End: 2020-09-14
  Administered 2020-09-12 – 2020-09-14 (×5): 5 mg via ORAL
  Filled 2020-09-12 (×7): qty 1

## 2020-09-12 MED ORDER — FLUTICASONE-UMECLIDIN-VILANT 100-62.5-25 MCG/INH IN AEPB: 1.0000 | INHALATION_SPRAY | Freq: Every day | RESPIRATORY_TRACT | Status: DC

## 2020-09-12 MED ORDER — LEVOTHYROXINE SODIUM 25 MCG PO TABS
25.0000 ug | ORAL_TABLET | Freq: Every morning | ORAL | Status: DC
  Administered 2020-09-13: 25 ug via ORAL
  Filled 2020-09-12: qty 1

## 2020-09-12 MED ORDER — FLUTICASONE FUROATE-VILANTEROL 100-25 MCG/INH IN AEPB
1.0000 | INHALATION_SPRAY | Freq: Every day | RESPIRATORY_TRACT | Status: DC
  Administered 2020-09-13 – 2020-09-19 (×6): 1 via RESPIRATORY_TRACT
  Filled 2020-09-12: qty 28

## 2020-09-12 MED ORDER — ALBUTEROL SULFATE (2.5 MG/3ML) 0.083% IN NEBU: 2.5000 mg | INHALATION_SOLUTION | Freq: Four times a day (QID) | RESPIRATORY_TRACT | Status: DC | PRN

## 2020-09-12 MED ORDER — FENTANYL CITRATE PF 50 MCG/ML IJ SOSY
50.0000 ug | PREFILLED_SYRINGE | Freq: Once | INTRAMUSCULAR | Status: AC
  Administered 2020-09-12: 50 ug via INTRAVENOUS
  Filled 2020-09-12: qty 1

## 2020-09-12 MED ORDER — MORPHINE SULFATE (PF) 2 MG/ML IV SOLN
2.0000 mg | INTRAVENOUS | Status: DC
Start: 2020-09-12 — End: 2020-09-12

## 2020-09-12 NOTE — ED Notes (Signed)
Pt remains in CT at this time, Trauma RN escorted pt to CT.

## 2020-09-12 NOTE — ED Notes (Signed)
Attempted to call report, NS reports that receiving charge has not looked at the chart at this time.

## 2020-09-12 NOTE — H&P (Signed)
[facility] Admission History and Physical Service Pager: [phone]  Patient name: Kyle Farley Medical record number: [id number] Date of birth: December 27, 1940 Age: 80 y.o. Gender: male  Primary Care Provider: Zoila Shutter, NP Consultants: Orthopedics Code Status: Full Code  Preferred Emergency Contact: Derrill Kay, Son, [phone]  Chief Complaint: fall  Assessment and Plan: Kyle Farley is a 80 y.o. male presenting with femur fracture of right LE and pelvic rami fracture. PMH is significant for pAF, COPD,HFpEF?  Right periprosthetic femur fracture  nondisplaced right inferior pubic ramus fracture Patient presented via EMS after a fall at home on his right side due to imbalance while using walker for assistance ambulating. Pt denies that he hit his head during the fall. Hip XR showed periprosthetic femur fracture as well as pelvic rami fracture. Ortho was consulted and plans to perform fixation on 8/28 as the patient is currently on Eliquis which he takes for Afib. He endorses 7/10 pain currently. Patient was given dose of dilaudid and fentanyl in ED prior to admission. Exam notable for normal intact pulses in bilateral LE without signs of skin mottling, skin was not cool to touch and patient demonstrated ability to move bilateral lower extremities.  - admitted to [facility], Med tele, attending Dr. Owens Shark - tylenol every 4 hours  - oxy 5 moderate pain q 4 hrs PRN  - 5 IV morphine severe pain q 4 hours PRN  - ortho consulted and planning surgery on 8/28 - VS per floor protocol - CBC, BMP, TSH  -up with assistance  - will order PT/OT evaluation following surgery   COPD: chronic, stable Home medications include albuterol inhaler and nebulizer as well as trelegy Ellipta.  Patient has been on room air during admission and uses supplemental oxygen PRN, reports 3 L. -Continue home albuterol inhaler and Trelegy Ellipta  Reports chronic cough and SOB.  -We will  monitor respiratory status with vitals per floor protocol -will obtain CXR  - continue home albuterol  - continue trelegy inhaler   HTN: chronic Patient normotensive on admission with blood pressure ranging between 102-132/62-80. Pt received 2L bolus for hypotensive pressures (systolic in the [id number]) on EMS route. Home medication includes lisinopril 5 mg daily although has been held by PCP due to syncope. - monitor BP with VS  - hold lisinopril    HFpEF?  paroxysmal Atrial Fibrillation  Home medications include flecanide 50 mg, Lasix 20 mg, diltiazem 60 mg and amiodarone 200 mg.  No recent echo available in chart for review.Will obtain medication list from patient's son to verify what medications are active. Patient has Lyndel Safe score of 0.4% chance for MI or cardiac arrest intraoperatively/30 days post operatively. Will consider cardiology consult if echo shows sever findings for HF. Last cardiology notes normal systolic function. Patient's EKG on admission shows AF.  - obtain updated echo - hold home flecainide, amio (until review of home med reconciliation) - cont Lasix (patient reports taking this daily) - cont hm diltiazem  - hold eliquis in preparation for surgery    Hx of Alcohol Abuse  Patient reports that his last drink was last night .  He denies history of alcohol withdrawal symptoms including delirium tremens.  He reports that he normally drinks Toposar. -CIWA -Continue home multivitamin as well as thiamine and folate - will plan to add ativan if patient has elevated CIWA scores   GERD Home medication includes 40 mg of Prilosec. -Continue home protonix while admitted   Hypothyroidism  Home medications include Synthroid 25 mcg -Continue home Synthroid  BPH -Continue home Flomax 0.4 mg   FEN/GI: heart healthy diet   Prophylaxis: SQ Hep prior to surg for 3 doses   Disposition: Med-telemetry   History of Present Illness:  Kyle Farley is a 80 y.o. male presenting  after a fall at home now reporting hip pain in found to have pelvic as well as femur fracture on the right.  Patient states he fell while trying to move his walker into the home and the storm door pulled his shoe off and he tripped and fell. He reports that he fell on his right side and states this is the same side that he broke his right hip. He reports that he often has falls. He states that sometimes the dogs pull him down and that his hard is not flat. Patient had several family members around to witness the fall. Patient denies hitting his head. He reports the pain as a 15 on scale of 1-10. He reports some shooting pain intermittently. Patient was participating in home physical therapy after his hip surgery. Patient ambulates with a cane and assistance with a walker.  Patient denies symptoms of vertigo.   Patient reports diarrhea every few weeks. He reports he has been having chest pain for the last 6 months. He follows with Dr. Bettina Gavia from cardiology and they are aware of this problem.   Patient reports that he had 3 beers last night.   Review Of Systems: Per HPI with the following additions:   Review of Systems  Constitutional:  Negative for chills, fever and unexpected weight change.  HENT:  Negative for congestion and sore throat.   Respiratory:  Positive for cough and shortness of breath.   Cardiovascular:  Positive for chest pain. Negative for palpitations.  Gastrointestinal:  Positive for diarrhea. Negative for blood in stool, constipation, nausea and vomiting.  Genitourinary:  Negative for dysuria and hematuria.  Musculoskeletal:  Positive for gait problem.       Fall  Joint pain    Skin:        Scrapes from fall   Neurological:  Negative for numbness and headaches.    Patient Active Problem List   Diagnosis Date Noted   Femur fracture, right ([place]) 09/12/2020   Hypertension    History of kidney stones    GERD (gastroesophageal reflux disease)    Dysrhythmia    Dyspnea     COPD (chronic obstructive pulmonary disease) (HCC)    Arthritis    COPD exacerbation ([place]) 01/24/2017   Kidney stones 01/24/2017   Hyperlipidemia 01/24/2017   CHF (congestive heart failure) ([place]) 01/24/2017   Paroxysmal atrial fibrillation ([place]) 10/21/2014   Essential hypertension 10/21/2014   Cigarette smoker 10/21/2014    Past Medical History: Past Medical History:  Diagnosis Date   Arthritis    CHF (congestive heart failure) ([place])    Cigarette smoker 10/21/2014   COPD (chronic obstructive pulmonary disease) (HCC)    COPD exacerbation ([place]) 01/24/2017   Dyspnea    Dysrhythmia    atrial fibrillation   Essential hypertension 10/21/2014   GERD (gastroesophageal reflux disease)    History of kidney stones    Hyperlipidemia 01/24/2017   Hypertension    Kidney stones 01/24/2017   Paroxysmal atrial fibrillation ([place]) 10/21/2014    Past Surgical History: Past Surgical History:  Procedure Laterality Date   LEG SURGERY Right    steal pin placed in right leg 35 years ago  Social History: Social History   Tobacco Use   Smoking status: Every Day    Packs/day: 1.00    Years: 62.00    Pack years: 62.00    Types: Cigarettes, Cigars   Smokeless tobacco: Never  Vaping Use   Vaping Use: Never used  Substance Use Topics   Alcohol use: Yes    Alcohol/week: 5.0 standard drinks    Types: 5 Standard drinks or equivalent per week    Comment: 5th of rum a week or 1 drink a day   Drug use: No   Additional social history:   Please also refer to relevant sections of EMR.  Family History: Family History  Problem Relation Age of Onset   Cancer Mother    Diabetes Mother    Breast cancer Sister     Allergies and Medications: No Known Allergies No current facility-administered medications on file prior to encounter.   Current Outpatient Medications on File Prior to Encounter  Medication Sig Dispense Refill   albuterol ([name] HFA;VENTOLIN HFA) 108 (90 Base) MCG/ACT inhaler  Inhale 2 puffs into the lungs every 4 (four) hours as needed for wheezing or shortness of breath.     albuterol ([name]) (2.5 MG/3ML) 0.083% nebulizer solution Take 2.5 mg by nebulization every 6 (six) hours as needed for wheezing or shortness of breath.     amiodarone (PACERONE) 200 MG tablet Take 200 mg by mouth daily.     apixaban (ELIQUIS) 5 MG TABS tablet Take 1 tablet (5 mg total) by mouth 2 (two) times daily. 60 tablet 11   Cholecalciferol (VITAMIN D3) 1.25 MG (50000 UT) CAPS Take 1 capsule by mouth 3 (three) times a week.     diltiazem (CARDIZEM) 60 MG tablet Take 60 mg by mouth every 6 (six) hours.     flecainide (TAMBOCOR) 50 MG tablet Take 50 mg by mouth daily.     furosemide (LASIX) 20 MG tablet Take 10 mg by mouth every other day.     levothyroxine (SYNTHROID) 25 MCG tablet Take 25 mcg by mouth every morning.     lisinopril (PRINIVIL,ZESTRIL) 5 MG tablet Take 5 mg by mouth daily.     metoprolol succinate (TOPROL XL) 25 MG 24 hr tablet Take 0.5 tablets (12.5 mg total) by mouth in the morning and at bedtime. 45 tablet 2   Multiple Vitamin (MULTIVITAMIN) capsule Take 1 capsule by mouth daily.     omeprazole (PRILOSEC) 40 MG capsule Take 1 capsule by mouth every morning.     pantoprazole (PROTONIX) 40 MG tablet Take 1 tablet by mouth daily.     tamsulosin (FLOMAX) 0.4 MG CAPS capsule Take 0.4 mg by mouth daily.     TRELEGY ELLIPTA 100-62.5-25 MCG/INH AEPB Inhale 1 puff into the lungs daily.      Objective: BP 122/78 (BP Location: Right Arm)   Pulse 89   Temp 97.6 F (36.4 C) (Oral)   Resp 20   Ht '5\' 9"'$  (1.753 m)   Wt 70.3 kg   SpO2 96%   BMI 22.89 kg/m  Exam: General: awake, alert, no acute distress Eyes: EOMI ENTM: moist mucous membranes Cardiovascular: RRR, no murmurs auscultated Respiratory: diminished breath sounds bilaterally Gastrointestinal: soft, nontender, normoactive bowel sounds MSK: good tone Derm: abrasion on right upper extremity covered with  Neuro: no  focal deficits appreciated, appropriate in conversation Psych: normal mood and affect Extremities: shortened and externally rotated right lower extremity, +1 pitting edema on left lower extremity up to the knee, +1  pitting edema on right lower extremity, +2 pedal and radial pulses  Labs and Imaging: CBC BMET  Recent Labs  Lab 09/12/20 1250  WBC 8.2  HGB 9.0*  HCT 27.4*  PLT 174   Recent Labs  Lab 09/12/20 1250  NA 128*  K 3.6  CL 99  CO2 21*  BUN 7*  CREATININE 0.78  GLUCOSE 81  CALCIUM 6.9*     EKG: Irregular rate and rhythm without PR waves, prolonged QRS interval, RSR' wave in V1 and V3 c/w right bundle branch block  CT Femur Right w/o Contrast: Periprosthetic fracture along the right femoral stem, with nondisplaced component proximally extending through the lesser trochanter, and a severely displaced component distal to the femoral stem with foreshortening.  DG Femur 2 views Right: 1. Moderately displaced, oblique impacted fracture of the mid femoral shaft, approximately 1 cm distal to the tip of the right hip prosthesis femoral component.  2. Right hip prosthesis is intact and in appropriate position.  CT Pelvis w/o contrast: Partially visualized periprosthetic fracture along the right femoral stem, which extends through the lesser trochanter. See separately dictated right femur CT for further description. Nondisplaced right inferior pubic ramus fracture.  CT Head w/o contrast: no acute intracranial abnormality  Wells Guiles, DO 09/12/2020, 5:49 PM PGY-1, [place] Intern pager: [phone], text pages welcome  FPTS Upper-Level Resident Addendum   I have independently interviewed and examined the patient. I have discussed the above with Dr.Dahbura and agree with the documented plan. My edits for correction/addition/clarification are included above. Please see any attending notes.   Eulis Foster, MD PGY-3, [facility] 09/12/2020 7:44 PM  [place] Service pager: [phone] (text pages welcome through [place])

## 2020-09-12 NOTE — Hospital Course (Addendum)
Kyle Farley is a 80 year old with a history of A. fib on Eliquis, COPD, alcohol use, prior hip fracture s/p repair who was admitted to the [facility] Teaching Service at [facility] for right periprosthetic femur fracture and nondisplaced right inferior pubic ramus fracture s/p fall. His hospital course is detailed below:  Right Femur and Pelvic Fracture  Patient suffered a fall and sustained a fracture to femur at the site of his prior prosthesis and inferior pubic ramus fracture.  Patient's hemoglobin dropped steadily throughout hospital course to the point of requiring 2 units pRBC.  No frank bleeding was noted near fracture location.  He was optimized for the OR and underwent ORIF of his right proximal femur fracutre on 8/29. Had an estimated 900 mL blood loss and received 2 additional units PRBC after surgery.  Post operatively, pain was controlled with tylenol, oxycodone, and lidocaine patch. Patient has good mobility at end of admission and has been working well with PT, SNF recommended.   Anemia  Presence of anemia on admission and pt lost 900 mL of blood during procedure. He received 5 U prbs total during his stay and never exhibited symptoms. Final Hgb was stable at 9.4>9. Follow up in 1 week.   Atrial fibrillation on Eliquis Upon discontinuation of Eliquis in preparation of procedure, patient developed A. fib.  Twelve-lead EKG showed A. fib with SVR with RBBB.  Followed by cardiology.  TSH elevated, Synthroid increased from 25 to 50 mg.  Amiodarone and flecainide discontinued.  Echo showed mild LV dysfunction with EF 40-45% with global hypokinesis.  Cardiology opted to proceed with left heart catheterization prior to surgery.  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 Cardiology recommended holding amiodarone, no flecainide, possibly starting aldactone outpatient, possibly continue with cardiac ablation outpatient. Rate has been well controlled prior to discharge.  Follow up with cardiology outpatient.   Hyponatremia Patient was notably hyponatremic upon presentation at 128, previously 136-year and a half ago.  Osmolality of 288 classifying patient has isotonic hyponatremia.  Hyponatremia did not improve with fluids.  Likely caused by active hypothyroidism and inappropriate ADH release in the setting of pain.  Patient continued to have excellent urine output. Chronic hyponatremia that has remained unchanged throughout admission.    PCP follow-up recommendations Recommend bisphosphonate therapy in future, 6-8 weeks after discharge Repeat CBC and BMP in 1 week Consider addition of spironolactone and Farxiga at discharge -per cardiology Consider losartan transition to [facility] at discharge -per cardiology Synthroid dose increased from 25 mcg to 50 mcg as TSH was elevated. Recheck TSH in 4-6 weeks.  Follow up with Dr. Bettina Gavia on 11/8, already arranged Ortho follow up in 2 weeks for repeat x-rays

## 2020-09-12 NOTE — ED Triage Notes (Addendum)
Pt BIB [facility] after mechanical fall at home. No LOC, dizziness or headache. EMS reports shortening and rotation of right leg, hx of hip replacement on same side. Initial pressure 82 palpated, increased to 100/60 with 2L NS. Pt also on eliquis and reports no hit to head.  EMS reports hx of afib, COPD, and R hip replacement in May

## 2020-09-12 NOTE — ED Notes (Signed)
Pt moved to hospital bed with buck traction in place. Pt verbalized pain eased with pain medication to 6/10. Pt states with movement and being placed in traction pain increased to 10/10. Pt appears  uncomfortable.

## 2020-09-12 NOTE — Progress Notes (Addendum)
FPTS Interim Progress Note  S: Experiencing some hip pain as expected but not in any acute distress.  No other complaints at this time.  O: BP 102/84 (BP Location: Left Arm)   Pulse 70   Temp 98.5 F (36.9 C) (Oral)   Resp 16   Ht '5\' 9"'$  (1.753 m)   Wt 70.3 kg   SpO2 99%   BMI 22.89 kg/m   General: Alert and oriented in no apparent distress Heart: S1, S2 present with no murmurs appreciated Lungs: Diminished breath sounds bilaterally with some fine crackles but no respiratory distress Extremities: Right leg in traction, both the right and the left foot do feel cool to touch but both have good peripheral pulses.   A/P: 80 year old gentleman presenting with a pelvic and femur fracture on the right.  His pain seems controlled at this time.  Hemoglobin remains stable.  He is planning to have surgery most likely on Sunday with orthopedics. Patient is noted to have hyponatremia with a serum sodium of 127.  He does appear euvolemic.  He does not seem to be experiencing any symptoms from this at this time.  We will continue to work-up patient's hyponatremia.    Lurline Del, DO 09/12/2020, 11:45 PM PGY-3, [place] [facility] Service pager [phone]

## 2020-09-12 NOTE — Consult Note (Signed)
Reason for Consult:Right femur fx Referring Physician: Myrtie Cruise Time called: [id number] Time at bedside: Kyle Farley is an 80 y.o. male.  HPI: Lenden was going into his house when the leg of his RW got caught in the storm door and he fell. He had immediate right thigh pain and could not get up. He was brought to the ED where x-rays showed a periprosthetic femur fx and orthopedic surgery was consulted. He c/o localized pain to the area. He lives alone but was supposed to be moving in with his son this weekend.  Past Medical History:  Diagnosis Date   Arthritis    CHF (congestive heart failure) (HCC)    Cigarette smoker 10/21/2014   COPD (chronic obstructive pulmonary disease) (HCC)    COPD exacerbation (HCC) 01/24/2017   Dyspnea    Dysrhythmia    atrial fibrillation   Essential hypertension 10/21/2014   GERD (gastroesophageal reflux disease)    History of kidney stones    Hyperlipidemia 01/24/2017   Hypertension    Kidney stones 01/24/2017   Paroxysmal atrial fibrillation ([place]) 10/21/2014    Past Surgical History:  Procedure Laterality Date   LEG SURGERY Right    steal pin placed in right leg 35 years ago    Family History  Problem Relation Age of Onset   Cancer Mother    Diabetes Mother    Breast cancer Sister     Social History:  reports that he has been smoking cigarettes and cigars. He has a 62.00 pack-year smoking history. He has never used smokeless tobacco. He reports current alcohol use of about 5.0 standard drinks per week. He reports that he does not use drugs.  Allergies: No Known Allergies  Medications: I have reviewed the patient's current medications.  Results for orders placed or performed during the hospital encounter of 09/12/20 (from the past 48 hour(s))  Basic metabolic panel     Status: Abnormal   Collection Time: 09/12/20 12:50 PM  Result Value Ref Range   Sodium 128 (L) 135 - 145 mmol/L   Potassium 3.6 3.5 - 5.1 mmol/L   Chloride 99 98 - 111  mmol/L   CO2 21 (L) 22 - 32 mmol/L   Glucose, Bld 81 70 - 99 mg/dL    Comment: Glucose reference range applies only to samples taken after fasting for at least 8 hours.   BUN 7 (L) 8 - 23 mg/dL   Creatinine, Ser 0.78 0.61 - 1.24 mg/dL   Calcium 6.9 (L) 8.9 - 10.3 mg/dL   GFR, Estimated >60 >60 mL/min    Comment: (NOTE) Calculated using the CKD-EPI Creatinine Equation (2021)    Anion gap 8 5 - 15    Comment: Performed at [facility] [address], [place], [place] [address]  CBC     Status: Abnormal   Collection Time: 09/12/20 12:50 PM  Result Value Ref Range   WBC 8.2 4.0 - 10.5 K/uL   RBC 3.03 (L) 4.22 - 5.81 MIL/uL   Hemoglobin 9.0 (L) 13.0 - 17.0 g/dL   HCT 27.4 (L) 39.0 - 52.0 %   MCV 90.4 80.0 - 100.0 fL   MCH 29.7 26.0 - 34.0 pg   MCHC 32.8 30.0 - 36.0 g/dL   RDW 15.6 (H) 11.5 - 15.5 %   Platelets 174 150 - 400 K/uL   nRBC 0.0 0.0 - 0.2 %    Comment: Performed at [facility],  [place] [address]    CT HEAD WO CONTRAST (5MM)  Result Date: 09/12/2020 CLINICAL DATA:  Head injury after fall. EXAM: CT HEAD WITHOUT CONTRAST TECHNIQUE: Contiguous axial images were obtained from the base of the skull through the vertex without intravenous contrast. COMPARISON:  May 21, 2020. FINDINGS: Brain: Mild chronic ischemic white matter disease is noted. No mass effect or midline shift is noted. Ventricular size is within normal limits. There is no evidence of mass lesion, hemorrhage or acute infarction. Vascular: No hyperdense vessel or unexpected calcification. Skull: Normal. Negative for fracture or focal lesion. Sinuses/Orbits: No acute finding. Other: Fluid is noted in right mastoid air cells. IMPRESSION: No acute intracranial abnormality seen. Electronically Signed   By: Marijo Conception M.D.   On: 09/12/2020 13:58   CT PELVIS WO CONTRAST  Result Date: 09/12/2020 CLINICAL DATA:  Pelvic trauma suspected fracture or dislocation of right prosthetic hip  s/p fall EXAM: CT PELVIS WITHOUT CONTRAST TECHNIQUE: Multidetector CT imaging of the pelvis was performed following the standard protocol without intravenous contrast. COMPARISON:  Radiograph 07/14/2020 FINDINGS: Bones/Joint/Cartilage There is a partially visualized periprosthetic fracture along the right femoral stem extending through the lesser trochanter. The femoral head component is well seated within the acetabular cup. There is a nondisplaced right inferior pubic ramus fracture (series 4, image 395). There is no pubic diastasis. No SI joint widening. There is heterotopic ossification along the anterior aspect of the right hip. 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 There is bilateral SI joint degenerative change. There is lower lumbar spine degenerative changes with posterior disc bulging and bilateral facet arthropathy likely resulting in mild-to-moderate spinal canal and neural foraminal narrowing. Ligaments Suboptimally assessed by CT. Muscles and Tendons No significant muscle atrophy. Soft tissues There is soft tissue swelling along the right hip. Abdominopelvic structures Unchanged nonobstructive right renal calculi. 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 Please see recommendations on prior CT abdomen pelvis on February 09, 2020. There is intraluminal gas within the bladder, presumably from recent catheterization. IMPRESSION: Partially visualized periprosthetic fracture along the right femoral stem, which extends through the lesser trochanter. See separately dictated right femur CT for further description. Nondisplaced right inferior pubic ramus fracture. Electronically Signed   By: Maurine Simmering M.D.   On: 09/12/2020 14:11    Review of Systems  HENT:  Negative for ear discharge, ear pain, hearing loss and tinnitus.   Eyes:  Negative for photophobia and pain.  Respiratory:  Negative for cough and shortness of breath.   Cardiovascular:  Negative for chest pain.   Gastrointestinal:  Negative for abdominal pain, nausea and vomiting.  Genitourinary:  Negative for dysuria, flank pain, frequency and urgency.  Musculoskeletal:  Positive for arthralgias (Right leg). Negative for back pain, myalgias and neck pain.  Neurological:  Negative for dizziness and headaches.  Hematological:  Does not bruise/bleed easily.  Psychiatric/Behavioral:  The patient is not nervous/anxious.   Blood pressure 114/74, pulse 84, temperature 97.6 F (36.4 C), temperature source Oral, resp. rate 18, SpO2 91 %. Physical Exam Constitutional:      General: He is not in acute distress.    Appearance: He is well-developed. He is not diaphoretic.  HENT:     Head: Normocephalic and atraumatic.  Eyes:     General: No scleral icterus.       Right eye: No discharge.        Left eye: No discharge.     Conjunctiva/sclera: Conjunctivae normal.  Cardiovascular:  Rate and Rhythm: Normal rate and regular rhythm.  Pulmonary:     Effort: Pulmonary effort is normal. No respiratory distress.  Musculoskeletal:     Cervical back: Normal range of motion.     Comments: RLE No traumatic wounds or rash, old ecchymoses  Severe TTP thigh  No knee or ankle effusion  Knee stable to varus/ valgus and anterior/posterior stress  Sens DPN, SPN, TN intact  Motor EHL, ext, flex, evers 5/5  DP 2+, PT 1+, No significant edema  Skin:    General: Skin is warm and dry.  Neurological:     Mental Status: He is alert.  Psychiatric:        Mood and Affect: Mood normal.        Behavior: Behavior normal.    Assessment/Plan: Right periprosthetic femur fx -- Likely fixation Sunday with Dr. Percell Miller, delay 2/2 Eliquis. Multiple medical problems including afib on Eliquis and COPD -- Have asked IM to admit, manage, and clear. Appreciate their help. Please hold Eliquis.    Lisette Abu, PA-C Orthopedic Surgery [phone] 09/12/2020, 2:19 PM

## 2020-09-12 NOTE — ED Provider Notes (Signed)
[facility] EMERGENCY DEPARTMENT Provider Note   CSN: [id number] Arrival date & time: 09/12/20  1225     History Chief Complaint  Patient presents with   Hip Pain    Kyle Farley is a 80 y.o. male present emerged department with right hip pain.  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  He is also on Eliquis.  He reports he had mechanical fall about an hour prior to arrival, landed on his right hip.  EMS reports that the patient had a shortened and everted right hip.  They did note that he had a systolic pressure in the [id number], given 2 L of fluid and the pressure appears of improved.  The patient reports that he does take blood pressure medications the morning and did so this morning.  He denies dizziness.  He denies head injury or trauma.  He reports pain in his right hip only.  He reports he has chronic bruising in his right lower extremity.  HPI     Past Medical History:  Diagnosis Date   Arthritis    CHF (congestive heart failure) (HCC)    Cigarette smoker 10/21/2014   COPD (chronic obstructive pulmonary disease) (HCC)    COPD exacerbation (HCC) 01/24/2017   Dyspnea    Dysrhythmia    atrial fibrillation   Essential hypertension 10/21/2014   GERD (gastroesophageal reflux disease)    History of kidney stones    Hyperlipidemia 01/24/2017   Hypertension    Kidney stones 01/24/2017   Paroxysmal atrial fibrillation ([place]) 10/21/2014    Patient Active Problem List   Diagnosis Date Noted   Femur fracture, right ([place]) 09/12/2020   Hypertension    History of kidney stones    GERD (gastroesophageal reflux disease)    Dysrhythmia    Dyspnea    COPD (chronic obstructive pulmonary disease) (HCC)    Arthritis    COPD exacerbation ([place]) 01/24/2017   Kidney stones 01/24/2017   Hyperlipidemia 01/24/2017   CHF (congestive heart failure) ([place]) 01/24/2017   Paroxysmal atrial fibrillation ([place]) 10/21/2014   Essential hypertension 10/21/2014   Cigarette  smoker 10/21/2014    Past Surgical History:  Procedure Laterality Date   LEG SURGERY Right    steal pin placed in right leg 35 years ago       Family History  Problem Relation Age of Onset   Cancer Mother    Diabetes Mother    Breast cancer Sister     Social History   Tobacco Use   Smoking status: Every Day    Packs/day: 1.00    Years: 62.00    Pack years: 62.00    Types: Cigarettes, Cigars   Smokeless tobacco: Never  Vaping Use   Vaping Use: Never used  Substance Use Topics   Alcohol use: Yes    Alcohol/week: 5.0 standard drinks    Types: 5 Standard drinks or equivalent per week    Comment: 5th of rum a week or 1 drink a day   Drug use: No    Home Medications Prior to Admission medications   Medication Sig Start Date End Date Taking? Authorizing Provider  albuterol ([name] HFA;VENTOLIN HFA) 108 (90 Base) MCG/ACT inhaler Inhale 2 puffs into the lungs every 4 (four) hours as needed for wheezing or shortness of breath.    [provider]  albuterol ([name]) (2.5 MG/3ML) 0.083% nebulizer solution Take 2.5 mg by nebulization every 6 (six) hours as needed for wheezing or  shortness of breath.    [provider]  amiodarone (PACERONE) 200 MG tablet Take 200 mg by mouth daily. 04/17/20   [provider]  apixaban (ELIQUIS) 5 MG TABS tablet Take 1 tablet (5 mg total) by mouth 2 (two) times daily. 03/12/19   Richardo Priest, MD  Cholecalciferol (VITAMIN D3) 1.25 MG (50000 UT) CAPS Take 1 capsule by mouth 3 (three) times a week.    [provider]  diltiazem (CARDIZEM) 60 MG tablet Take 60 mg by mouth every 6 (six) hours. 05/16/20   [provider]  flecainide (TAMBOCOR) 50 MG tablet Take 50 mg by mouth daily.    [provider]  furosemide (LASIX) 20 MG tablet Take 10 mg by mouth every other day.    [provider]  levothyroxine (SYNTHROID) 25 MCG tablet Take 25 mcg by mouth every morning. 04/02/20   [provider]  lisinopril (PRINIVIL,ZESTRIL) 5 MG tablet Take 5 mg by mouth daily.    [provider]  metoprolol succinate (TOPROL XL) 25 MG 24 hr tablet Take 0.5 tablets (12.5 mg total) by mouth in the morning and at bedtime. 06/20/20   Tobb, Kardie, DO  Multiple Vitamin (MULTIVITAMIN) capsule Take 1 capsule by mouth daily.    [provider]  omeprazole (PRILOSEC) 40 MG capsule Take 1 capsule by mouth every morning. 05/16/20   [provider]  pantoprazole (PROTONIX) 40 MG tablet Take 1 tablet by mouth daily. 04/17/20   [provider]  tamsulosin (FLOMAX) 0.4 MG CAPS capsule Take 0.4 mg by mouth daily. 04/17/20   [provider]  TRELEGY ELLIPTA 100-62.5-25 MCG/INH AEPB Inhale 1 puff into the lungs daily. 05/16/20   [provider]    Allergies    Patient has no known allergies.  Review of Systems   Review of Systems  Constitutional:  Negative for chills and fever.  Respiratory:  Negative for cough and shortness of breath.   Cardiovascular:  Negative for chest pain and palpitations.  Gastrointestinal:  Negative for abdominal pain and vomiting.  Genitourinary:  Negative for dysuria and hematuria.  Musculoskeletal:  Positive for arthralgias and myalgias.  Skin:  Negative for color change and rash.  Neurological:  Negative for weakness and numbness.   Physical Exam Updated Vital Signs BP 122/78 (BP Location: Right Arm)   Pulse 89   Temp 97.6 F (36.4 C) (Oral)   Resp 20   Ht '5\' 9"'$  (1.753 m)   Wt 70.3 kg   SpO2 96%   BMI 22.89 kg/m   Physical Exam Constitutional:      General: He is not in acute distress. HENT:     Head: Normocephalic and atraumatic.  Eyes:     Conjunctiva/sclera: Conjunctivae normal.     Pupils: Pupils are equal, round, and reactive to light.  Cardiovascular:     Rate and Rhythm: Normal rate and regular rhythm.     Pulses: Normal pulses.  Pulmonary:     Effort: Pulmonary effort is normal. No  respiratory distress.  Abdominal:     General: There is no distension.     Tenderness: There is no abdominal tenderness.  Musculoskeletal:     Comments: Right extremity shortened, everted Tenderness of mid right thigh  Skin:    General: Skin is warm and dry.  Neurological:     General: No focal deficit present.     Mental Status: He is alert and oriented to person, place, and time. Mental status is  at baseline.     Sensory: No sensory deficit.     Motor: No weakness.  Psychiatric:        Mood and Affect: Mood normal.        Behavior: Behavior normal.    ED Results / Procedures / Treatments   Labs (all labs ordered are listed, but only abnormal results are displayed) Labs Reviewed  BASIC METABOLIC PANEL - Abnormal; Notable for the following components:      Result Value   Sodium 128 (*)    CO2 21 (*)    BUN 7 (*)    Calcium 6.9 (*)    All other components within normal limits  CBC - Abnormal; Notable for the following components:   RBC 3.03 (*)    Hemoglobin 9.0 (*)    HCT 27.4 (*)    RDW 15.6 (*)    All other components within normal limits  RESP PANEL BY RT-PCR (FLU A&B, COVID) ARPGX2  TYPE AND SCREEN  ABO/RH    EKG None  Radiology CT HEAD WO CONTRAST (5MM)  Result Date: 09/12/2020 CLINICAL DATA:  Head injury after fall. EXAM: CT HEAD WITHOUT CONTRAST TECHNIQUE: Contiguous axial images were obtained from the base of the skull through the vertex without intravenous contrast. COMPARISON:  May 21, 2020. FINDINGS: Brain: Mild chronic ischemic white matter disease is noted. No mass effect or midline shift is noted. Ventricular size is within normal limits. There is no evidence of mass lesion, hemorrhage or acute infarction. Vascular: No hyperdense vessel or unexpected calcification. Skull: Normal. Negative for fracture or focal lesion. Sinuses/Orbits: No acute finding. Other: Fluid is noted in right mastoid air cells. IMPRESSION: No acute intracranial abnormality seen.  Electronically Signed   By: Marijo Conception M.D.   On: 09/12/2020 13:58   CT PELVIS WO CONTRAST  Result Date: 09/12/2020 CLINICAL DATA:  Pelvic trauma suspected fracture or dislocation of right prosthetic hip s/p fall EXAM: CT PELVIS WITHOUT CONTRAST TECHNIQUE: Multidetector CT imaging of the pelvis was performed following the standard protocol without intravenous contrast. COMPARISON:  Radiograph 07/14/2020 FINDINGS: Bones/Joint/Cartilage There is a partially visualized periprosthetic fracture along the right femoral stem extending through the lesser trochanter. The femoral head component is well seated within the acetabular cup. There is a nondisplaced right inferior pubic ramus fracture (series 4, image 395). There is no pubic diastasis. No SI joint widening. There is heterotopic ossification along the anterior aspect of the right hip. 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 There is bilateral SI joint degenerative change. There is lower lumbar spine degenerative changes with posterior disc bulging and bilateral facet arthropathy likely resulting in mild-to-moderate spinal canal and neural foraminal narrowing. Ligaments Suboptimally assessed by CT. Muscles and Tendons No significant muscle atrophy. Soft tissues There is soft tissue swelling along the right hip. Abdominopelvic structures Unchanged nonobstructive right renal calculi. 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 Please see recommendations on prior CT abdomen pelvis on February 09, 2020. There is intraluminal gas within the bladder, presumably from recent catheterization. IMPRESSION: Partially visualized periprosthetic fracture along the right femoral stem, which extends through the lesser trochanter. See separately dictated right femur CT for further description. Nondisplaced right inferior pubic ramus fracture. Electronically Signed   By: Maurine Simmering M.D.   On: 09/12/2020 14:11   CT FEMUR RIGHT WO  CONTRAST  Result Date: 09/12/2020 CLINICAL DATA:  Fall, trauma EXAM: CT OF THE LOWER RIGHT EXTREMITY WITHOUT CONTRAST TECHNIQUE: Multidetector CT imaging of  the right lower extremity was performed according to the standard protocol. COMPARISON:  Radiograph 07/14/2020 FINDINGS: Bones/Joint/Cartilage There is a right hip arthroplasty in normal alignment with femoral head component is seated within the acetabular cup. There is a periprosthetic fracture which courses through the lesser trochanter through the subtrochanteric femur posteriorly, and with a severely displaced obliquely oriented spiral fracture distal to the femoral stem, displaced proximally 2.4 cm laterally and with foreshortening. Ligaments Suboptimally assessed by CT. Muscles and Tendons No atrophy. Soft tissues Extensive soft tissue swelling.  Vascular calcifications. IMPRESSION: Periprosthetic fracture along the right femoral stem, with nondisplaced component proximally extending through the lesser trochanter, and a severely displaced component distal to the femoral stem with foreshortening. Electronically Signed   By: Maurine Simmering M.D.   On: 09/12/2020 17:07   DG FEMUR PORT, MIN 2 VIEWS RIGHT  Result Date: 09/12/2020 CLINICAL DATA:  Patient fell.  Pain in right leg. EXAM: RIGHT FEMUR PORTABLE 2 VIEW COMPARISON:  Same day CT of the femur FINDINGS: Moderately displaced, oblique impacted fracture of the mid femoral shaft, with the fracture line approximately 1 cm distal to the distal of the right hip prosthesis femoral component. The hip prosthesis is intact and in appropriate position within the acetabulum. The distal fracture fragment is displaced laterally approximately 3 cm with approximately 4.5 cm of fracture fragment overlap. The distal femur and knee are rotated laterally. Single surgical skin staple is noted at the level of the right hip joint. IMPRESSION: 1. Moderately displaced, oblique impacted fracture of the mid femoral shaft,  approximately 1 cm distal to the tip of the right hip prosthesis femoral component. Please see today's dedicated CT femur report for further details. 2. Right hip prosthesis is intact and in appropriate position. Electronically Signed   By: Ileana Roup M.D.   On: 09/12/2020 16:33    Procedures Procedures   Medications Ordered in ED Medications  fentaNYL (SUBLIMAZE) injection 50 mcg (50 mcg Intravenous Given 09/12/20 1300)  HYDROmorphone (DILAUDID) injection 1 mg (1 mg Intravenous Given 09/12/20 1724)    ED Course  I have reviewed the triage vital signs and the nursing notes.  Pertinent labs & imaging results that were available during my care of the patient were reviewed by me and considered in my medical decision making (see chart for details).  Mechanical fall with right hip pain Neurovascularly intact on arrival Workup reviewed consistent with closed displaced right femur fracture Ortho consulted and to determine operative options  Labs otherwise reviewed and at baseline  Patient reportedly hypotensive on EMS arrival but blood pressures have been stable with good mentation.  No evidence of compartment syndrome on my exam.  No report of large hematoma or pelvic bleed or pelvic bone fracture on CT pelvis.  Clinical Course as of 09/12/20 1755  Fri Sep 12, 2020  1418 Patient with displaced femur fracture noted on films.  Also mild hyponatremia.  He already received 2 L of normal saline per EMS.  Blood pressure has been stable here.  I spoke to the orthopedic team and they recommend medical admission and holding Eliquis - to determine OR date  [MT]  1438 Admitted to family medicine [MT]    Clinical Course User Index [MT] Tadao Emig, Carola Rhine, MD    Final Clinical Impression(s) / ED Diagnoses Final diagnoses:  Closed fracture of right femur, unspecified fracture morphology, unspecified portion of femur, initial encounter [facility])    Rx / DC Orders ED Discharge Orders     None  Wyvonnia Dusky, MD 09/12/20 [phone]

## 2020-09-12 NOTE — Progress Notes (Signed)
Orthopedic Tech Progress Note Patient Details:  Kyle Farley 1940/03/19 [id number]  Musculoskeletal Traction Type of Traction: Bucks Skin Traction Traction Location: Right leg Traction Weight: 10 lbs   Post Interventions Patient Tolerated: Well  [name] [name] 09/12/2020, 6:13 PM

## 2020-09-12 NOTE — ED Notes (Signed)
Patient transported to CT 

## 2020-09-12 NOTE — Progress Notes (Signed)
Orthopedic Tech Progress Note Patient Details:  Kyle Farley 10/20/1940 [id number] Level 2 trauma    Patient ID: Kyle Farley, male   DOB: [birth year], 80 y.o.   MRN: [id number]  [name] 09/12/2020, 1:52 PM

## 2020-09-13 ENCOUNTER — Inpatient Hospital Stay (HOSPITAL_COMMUNITY): Payer: Medicare Other

## 2020-09-13 DIAGNOSIS — J449 Chronic obstructive pulmonary disease, unspecified: Secondary | ICD-10-CM | POA: Diagnosis not present

## 2020-09-13 DIAGNOSIS — I10 Essential (primary) hypertension: Secondary | ICD-10-CM | POA: Diagnosis not present

## 2020-09-13 DIAGNOSIS — R079 Chest pain, unspecified: Secondary | ICD-10-CM

## 2020-09-13 DIAGNOSIS — I5022 Chronic systolic (congestive) heart failure: Secondary | ICD-10-CM | POA: Diagnosis not present

## 2020-09-13 DIAGNOSIS — I4510 Unspecified right bundle-branch block: Secondary | ICD-10-CM | POA: Diagnosis not present

## 2020-09-13 DIAGNOSIS — I480 Paroxysmal atrial fibrillation: Secondary | ICD-10-CM | POA: Diagnosis not present

## 2020-09-13 DIAGNOSIS — S7291XA Unspecified fracture of right femur, initial encounter for closed fracture: Secondary | ICD-10-CM | POA: Diagnosis not present

## 2020-09-13 DIAGNOSIS — F17210 Nicotine dependence, cigarettes, uncomplicated: Secondary | ICD-10-CM

## 2020-09-13 DIAGNOSIS — I420 Dilated cardiomyopathy: Secondary | ICD-10-CM

## 2020-09-13 LAB — ECHOCARDIOGRAM COMPLETE
AR max vel: 1.78 cm2
AV Area VTI: 1.68 cm2
AV Area mean vel: 1.75 cm2
AV Mean grad: 7.8 mmHg
AV Peak grad: 14.4 mmHg
Ao pk vel: 1.9 m/s
Height: 69 in
S' Lateral: 4.5 cm
Weight: 2480 oz

## 2020-09-13 LAB — COMPREHENSIVE METABOLIC PANEL
ALT: 11 U/L (ref 0–44)
AST: 15 U/L (ref 15–41)
Albumin: 2.1 g/dL — ABNORMAL LOW (ref 3.5–5.0)
Alkaline Phosphatase: 93 U/L (ref 38–126)
Anion gap: 6 (ref 5–15)
BUN: 11 mg/dL (ref 8–23)
CO2: 26 mmol/L (ref 22–32)
Calcium: 8 mg/dL — ABNORMAL LOW (ref 8.9–10.3)
Chloride: 93 mmol/L — ABNORMAL LOW (ref 98–111)
Creatinine, Ser: 0.98 mg/dL (ref 0.61–1.24)
GFR, Estimated: >60 mL/min (ref >60)
Glucose, Bld: 97 mg/dL (ref 70–99)
Potassium: 4 mmol/L (ref 3.5–5.1)
Sodium: 125 mmol/L — ABNORMAL LOW (ref 135–145)
Total Bilirubin: 0.7 mg/dL (ref 0.3–1.2)
Total Protein: 4.7 g/dL — ABNORMAL LOW (ref 6.5–8.1)

## 2020-09-13 LAB — CBC
HCT: 26.5 % — ABNORMAL LOW (ref 39.0–52.0)
HCT: 24.1 % — ABNORMAL LOW (ref 39.0–52.0)
Hemoglobin: 8.5 g/dL — ABNORMAL LOW (ref 13.0–17.0)
Hemoglobin: 7.6 g/dL — ABNORMAL LOW (ref 13.0–17.0)
MCH: 28.6 pg (ref 26.0–34.0)
MCH: 28.4 pg (ref 26.0–34.0)
MCHC: 32.1 g/dL (ref 30.0–36.0)
MCHC: 31.5 g/dL (ref 30.0–36.0)
MCV: 89.2 fL (ref 80.0–100.0)
MCV: 89.9 fL (ref 80.0–100.0)
Platelets: 198 10*3/uL (ref 150–400)
Platelets: 181 10*3/uL (ref 150–400)
RBC: 2.97 MIL/uL — ABNORMAL LOW (ref 4.22–5.81)
RBC: 2.68 MIL/uL — ABNORMAL LOW (ref 4.22–5.81)
RDW: 15.3 % (ref 11.5–15.5)
RDW: 15.6 % — ABNORMAL HIGH (ref 11.5–15.5)
WBC: 8.9 10*3/uL (ref 4.0–10.5)
WBC: 8 10*3/uL (ref 4.0–10.5)
nRBC: 0 % (ref 0.0–0.2)
nRBC: 0 % (ref 0.0–0.2)

## 2020-09-13 LAB — BASIC METABOLIC PANEL
Anion gap: 5 (ref 5–15)
BUN: 7 mg/dL — ABNORMAL LOW (ref 8–23)
CO2: 27 mmol/L (ref 22–32)
Calcium: 7.9 mg/dL — ABNORMAL LOW (ref 8.9–10.3)
Chloride: 94 mmol/L — ABNORMAL LOW (ref 98–111)
Creatinine, Ser: 0.86 mg/dL (ref 0.61–1.24)
GFR, Estimated: >60 mL/min (ref >60)
Glucose, Bld: 133 mg/dL — ABNORMAL HIGH (ref 70–99)
Potassium: 3.7 mmol/L (ref 3.5–5.1)
Sodium: 126 mmol/L — ABNORMAL LOW (ref 135–145)

## 2020-09-13 LAB — MRSA NEXT GEN BY PCR, NASAL: MRSA by PCR Next Gen: NOT DETECTED

## 2020-09-13 LAB — HEPATIC FUNCTION PANEL
ALT: 11 U/L (ref 0–44)
AST: 16 U/L (ref 15–41)
Albumin: 2.2 g/dL — ABNORMAL LOW (ref 3.5–5.0)
Alkaline Phosphatase: 105 U/L (ref 38–126)
Bilirubin, Direct: 0.2 mg/dL (ref 0.0–0.2)
Indirect Bilirubin: 0.4 mg/dL (ref 0.3–0.9)
Total Bilirubin: 0.6 mg/dL (ref 0.3–1.2)
Total Protein: 4.8 g/dL — ABNORMAL LOW (ref 6.5–8.1)

## 2020-09-13 LAB — BRAIN NATRIURETIC PEPTIDE: B Natriuretic Peptide: 230.4 pg/mL — ABNORMAL HIGH (ref 0.0–100.0)

## 2020-09-13 LAB — GLUCOSE, CAPILLARY: Glucose-Capillary: 151 mg/dL — ABNORMAL HIGH (ref 70–99)

## 2020-09-13 LAB — T4, FREE: Free T4: 0.5 ng/dL — ABNORMAL LOW (ref 0.61–1.12)

## 2020-09-13 LAB — VITAMIN D 25 HYDROXY (VIT D DEFICIENCY, FRACTURES): Vit D, 25-Hydroxy: 122.73 ng/mL — ABNORMAL HIGH (ref 30–100)

## 2020-09-13 LAB — PREPARE RBC (CROSSMATCH)

## 2020-09-13 LAB — OSMOLALITY, URINE: Osmolality, Ur: 288 mOsm/kg — ABNORMAL LOW (ref 300–900)

## 2020-09-13 MED ORDER — ENSURE ENLIVE PO LIQD
237.0000 mL | Freq: Two times a day (BID) | ORAL | Status: DC
  Administered 2020-09-13 – 2020-09-19 (×8): 237 mL via ORAL

## 2020-09-13 MED ORDER — FLECAINIDE ACETATE 50 MG PO TABS
50.0000 mg | ORAL_TABLET | Freq: Every day | ORAL | Status: DC
  Administered 2020-09-13: 50 mg via ORAL
  Filled 2020-09-13: qty 1

## 2020-09-13 MED ORDER — AMIODARONE HCL 200 MG PO TABS
200.0000 mg | ORAL_TABLET | Freq: Every day | ORAL | Status: DC
  Administered 2020-09-13: 200 mg via ORAL
  Filled 2020-09-13: qty 1

## 2020-09-13 MED ORDER — ADULT MULTIVITAMIN W/MINERALS CH
1.0000 | ORAL_TABLET | Freq: Every day | ORAL | Status: DC
  Administered 2020-09-13 – 2020-09-20 (×7): 1 via ORAL
  Filled 2020-09-13 (×7): qty 1

## 2020-09-13 MED ORDER — LEVOTHYROXINE SODIUM 50 MCG PO TABS
50.0000 ug | ORAL_TABLET | Freq: Every day | ORAL | Status: DC
  Administered 2020-09-16 – 2020-09-20 (×5): 50 ug via ORAL
  Filled 2020-09-13 (×5): qty 1

## 2020-09-13 MED ORDER — SODIUM CHLORIDE 0.9 % IV BOLUS
500.0000 mL | Freq: Once | INTRAVENOUS | Status: AC
  Administered 2020-09-13: 500 mL via INTRAVENOUS

## 2020-09-13 MED ORDER — SODIUM CHLORIDE 0.9% IV SOLUTION: Freq: Once | INTRAVENOUS | Status: AC

## 2020-09-13 MED ORDER — DILTIAZEM HCL 30 MG PO TABS: 30.0000 mg | ORAL_TABLET | Freq: Four times a day (QID) | ORAL | Status: DC

## 2020-09-13 NOTE — Consult Note (Signed)
Cardiology Consultation:   Patient ID: Kyle Farley MRN: [id number]; DOB: Aug 16, 1940  Admit date: 09/12/2020 Date of Consult: 09/13/2020  PCP:  Zoila Shutter, NP   [facility] Providers Cardiologist:  Shirlee More, MD   {   Patient Profile:   Kyle Farley is a 80 y.o. male with a hx of paroxysmal atrial fibrillation, hypertension, hyperlipidemia, COPD, AAA and smoker who is being seen 09/13/2020 for the evaluation of preop clearance at the request of Dr. Owens Shark.  Per note in care everywhere Sep/Oct 2018: Echocardiogram in 2017 with normal LV function and had normal stress test in 2017.  Previously on amiodarone but discontinued due to chronic lung disease.  History of Present Illness:   Mr. Wakeland presented yesterday after mechanical fall at home.  Leg caught in storm door.  No loss of consciousness or prodromal symptoms.  Had severe pain and unable to get up.  He has been dealing with gait instability for past few months.  Patient uses walker and cane for ambulation.  Reports sleeping in recliner chronically due to breathing issue.  Has intermittent lower extremity edema.  Reports every day substernal chest tightness which gets worse with minimal walking.  Patient reports medication 3 times per day for "chest relaxation".  He smokes half pack to 2 pack every day.  Drinks 6 beer every day.  CT of head without acute abnormality.  Work-up revealed nondisplaced right inferior pubic ramus fracture.  Awaiting on board and plan for surgery.  Patient is reported intermittent chest pain.  Cardiology is asked for further evaluation.  Patient takes Eliquis for anticoagulation.  Per report, patient on flecainide as well as amiodarone.  However patient was only on flecainide when last seen by Dr. Bettina Gavia 10/10/2019 (I have personally reviewed medication list and office visit note).   Potassium and creatinine are normal. Low-sodium Hemoglobin 7.5 TSH elevated at 43 Blood pressure soft  Echo  09/13/20  1. Left ventricular ejection fraction, by estimation, is 40 to 45%. The  left ventricle has mildly decreased function. The left ventricle  demonstrates global hypokinesis. Left ventricular diastolic parameters are  indeterminate.   2. Right ventricular systolic function is normal. The right ventricular  size is normal. There is normal pulmonary artery systolic pressure.   3. Left atrial size was moderately dilated.   4. The mitral valve is grossly normal. Mild mitral valve regurgitation.   5. The aortic valve is calcified. Aortic valve regurgitation is not  visualized. Aortic valve sclerosis is present, with no evidence of aortic  valve stenosis.   6. The inferior vena cava is dilated in size with >50% respiratory  variability, suggesting right atrial pressure of 8 mmHg.   Comparison(s): No prior Echocardiogram   Past Medical History:  Diagnosis Date   Arthritis    CHF (congestive heart failure) (HCC)    Cigarette smoker 10/21/2014   COPD (chronic obstructive pulmonary disease) (HCC)    COPD exacerbation ([place]) 01/24/2017   Dyspnea    Dysrhythmia    atrial fibrillation   Essential hypertension 10/21/2014   GERD (gastroesophageal reflux disease)    History of kidney stones    Hyperlipidemia 01/24/2017   Hypertension    Kidney stones 01/24/2017   Paroxysmal atrial fibrillation ([place]) 10/21/2014    Past Surgical History:  Procedure Laterality Date   LEG SURGERY Right    steal pin placed in right leg 35 years ago     Inpatient Medications: Scheduled Meds:  acetaminophen  650 mg Oral  Q4H   amiodarone  200 mg Oral Daily   diltiazem  30 mg Oral Q6H   feeding supplement  237 mL Oral BID BM   flecainide  50 mg Oral Daily   fluticasone furoate-vilanterol  1 puff Inhalation Daily   heparin injection (subcutaneous)  5,000 Units Subcutaneous Q8H   [START ON 09/14/2020] levothyroxine  50 mcg Oral QAC breakfast   multivitamin with minerals  1 tablet Oral Daily   pantoprazole  40  mg Oral Daily   tamsulosin  0.4 mg Oral Daily   umeclidinium bromide  1 puff Inhalation Daily   Continuous Infusions:  PRN Meds: albuterol, morphine injection, oxyCODONE  Allergies:   No Known Allergies  Social History:   Social History   Socioeconomic History   Marital status: Married    Spouse name: Not on file   Number of children: Not on file   Years of education: Not on file   Highest education level: Not on file  Occupational History   Not on file  Tobacco Use   Smoking status: Every Day    Packs/day: 1.00    Years: 62.00    Pack years: 62.00    Types: Cigarettes, Cigars   Smokeless tobacco: Never  Vaping Use   Vaping Use: Never used  Substance and Sexual Activity   Alcohol use: Yes    Alcohol/week: 5.0 standard drinks    Types: 5 Standard drinks or equivalent per week    Comment: 5th of rum a week or 1 drink a day   Drug use: No   Sexual activity: Not Currently  Other Topics Concern   Not on file  Social History Narrative   Not on file   Social Determinants of Health   Financial Resource Strain: Not on file  Food Insecurity: Not on file  Transportation Needs: Not on file  Physical Activity: Not on file  Stress: Not on file  Social Connections: Not on file  Intimate Partner Violence: Not on file    Family History:   Family History  Problem Relation Age of Onset   Cancer Mother    Diabetes Mother    Breast cancer Sister      ROS:  Please see the history of present illness.  All other ROS reviewed and negative.     Physical Exam/Data:   Vitals:   09/13/20 0830 09/13/20 1130 09/13/20 1157 09/13/20 1434  BP:  (!) 86/57 100/60 (!) 89/66  Pulse: (!) 52 69  74  Resp: 18   20  Temp:    98.2 F (36.8 C)  TempSrc:    Oral  SpO2: 97%   97%  Weight:      Height:        Intake/Output Summary (Last 24 hours) at 09/13/2020 1620 Last data filed at 09/13/2020 1016 Gross per 24 hour  Intake 620 ml  Output 700 ml  Net -80 ml   Last 3 Weights  09/12/2020 10/10/2019 04/12/2019  Weight (lbs) 155 lb 156 lb 164 lb  Weight (kg) 70.308 kg 70.761 kg 74.39 kg     Body mass index is 22.89 kg/m.  General:  Well nourished, well developed, in no acute distress HEENT: normal Lymph: no adenopathy Neck: no JVD Endocrine:  No thryomegaly Vascular: No carotid bruits; FA pulses 2+ bilaterally without bruits  Cardiac:  normal S1, S2; RRR; no murmur  Lungs:  clear to auscultation bilaterally, no wheezing, rhonchi or rales  Abd: soft, nontender, no hepatomegaly  Ext: no edema  Musculoskeletal:  No deformities, right leg has brace and weightbearing  skin: warm and dry  Neuro:  CNs 2-12 intact, no focal abnormalities noted Psych:  Normal affect   EKG:  The EKG was personally reviewed and demonstrates: Atrial fibrillation, right bundle branch block Telemetry:  Telemetry was personally reviewed and demonstrates: Atrial fibrillation at heart rate of 50 to 60s  Relevant CV Studies: As summarized above  Laboratory Data:  High Sensitivity Troponin:  No results for input(s): TROPONINIHS in the last 720 hours.   Chemistry Recent Labs  Lab 09/12/20 1250 09/12/20 2150 09/13/20 0018  NA 128* 127* 126*  K 3.6 4.1 3.7  CL 99 93* 94*  CO2 21* 28 27  GLUCOSE 81 94 133*  BUN 7* 8 7*  CREATININE 0.78 0.90 0.86  CALCIUM 6.9* 8.1* 7.9*  GFRNONAA >60 >60 >60  ANIONGAP '8 6 5    '$ Recent Labs  Lab 09/13/20 0018  PROT 4.8*  ALBUMIN 2.2*  AST 16  ALT 11  ALKPHOS 105  BILITOT 0.6   Hematology Recent Labs  Lab 09/12/20 2150 09/13/20 0018 09/13/20 1514  WBC 9.9 8.9 8.0  RBC 3.08* 2.97* 2.68*  HGB 9.1* 8.5* 7.6*  HCT 27.1* 26.5* 24.1*  MCV 88.0 89.2 89.9  MCH 29.5 28.6 28.4  MCHC 33.6 32.1 31.5  RDW 15.5 15.3 15.6*  PLT 196 198 181    Radiology/Studies:  CT HEAD WO CONTRAST (5MM)  Result Date: 09/12/2020 CLINICAL DATA:  Head injury after fall. EXAM: CT HEAD WITHOUT CONTRAST TECHNIQUE: Contiguous axial images were obtained from the base  of the skull through the vertex without intravenous contrast. COMPARISON:  May 21, 2020. FINDINGS: Brain: Mild chronic ischemic white matter disease is noted. No mass effect or midline shift is noted. Ventricular size is within normal limits. There is no evidence of mass lesion, hemorrhage or acute infarction. Vascular: No hyperdense vessel or unexpected calcification. Skull: Normal. Negative for fracture or focal lesion. Sinuses/Orbits: No acute finding. Other: Fluid is noted in right mastoid air cells. IMPRESSION: No acute intracranial abnormality seen. Electronically Signed   By: Marijo Conception M.D.   On: 09/12/2020 13:58   CT PELVIS WO CONTRAST  Result Date: 09/12/2020 CLINICAL DATA:  Pelvic trauma suspected fracture or dislocation of right prosthetic hip s/p fall EXAM: CT PELVIS WITHOUT CONTRAST TECHNIQUE: Multidetector CT imaging of the pelvis was performed following the standard protocol without intravenous contrast. COMPARISON:  Radiograph 07/14/2020 FINDINGS: Bones/Joint/Cartilage There is a partially visualized periprosthetic fracture along the right femoral stem extending through the lesser trochanter. The femoral head component is well seated within the acetabular cup. There is a nondisplaced right inferior pubic ramus fracture (series 4, image 395). There is no pubic diastasis. No SI joint widening. There is heterotopic ossification along the anterior aspect of the right hip. 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 There is bilateral SI joint degenerative change. There is lower lumbar spine degenerative changes with posterior disc bulging and bilateral facet arthropathy likely resulting in mild-to-moderate spinal canal and neural foraminal narrowing. Ligaments Suboptimally assessed by CT. Muscles and Tendons No significant muscle atrophy. Soft tissues There is soft tissue swelling along the right hip. Abdominopelvic structures Unchanged nonobstructive right renal calculi. Unchanged mildly dilated  infrarenal abdominal aorta measuring up to 3.2 cm. Please see recommendations on prior CT abdomen pelvis on February 09, 2020. There is intraluminal gas within the bladder, presumably from recent catheterization. IMPRESSION: Partially visualized periprosthetic fracture along the right femoral stem, which extends through  the lesser trochanter. See separately dictated right femur CT for further description. Nondisplaced right inferior pubic ramus fracture. Electronically Signed   By: Maurine Simmering M.D.   On: 09/12/2020 14:11   CT FEMUR RIGHT WO CONTRAST  Result Date: 09/12/2020 CLINICAL DATA:  Fall, trauma EXAM: CT OF THE LOWER RIGHT EXTREMITY WITHOUT CONTRAST TECHNIQUE: Multidetector CT imaging of the right lower extremity was performed according to the standard protocol. COMPARISON:  Radiograph 07/14/2020 FINDINGS: Bones/Joint/Cartilage There is a right hip arthroplasty in normal alignment with femoral head component is seated within the acetabular cup. There is a periprosthetic fracture which courses through the lesser trochanter through the subtrochanteric femur posteriorly, and with a severely displaced obliquely oriented spiral fracture distal to the femoral stem, displaced proximally 2.4 cm laterally and with foreshortening. Ligaments Suboptimally assessed by CT. Muscles and Tendons No atrophy. Soft tissues Extensive soft tissue swelling.  Vascular calcifications. IMPRESSION: Periprosthetic fracture along the right femoral stem, with nondisplaced component proximally extending through the lesser trochanter, and a severely displaced component distal to the femoral stem with foreshortening. Electronically Signed   By: Maurine Simmering M.D.   On: 09/12/2020 17:07   DG CHEST PORT 1 VIEW  Result Date: 09/12/2020 CLINICAL DATA:  Fall. EXAM: PORTABLE CHEST 1 VIEW COMPARISON:  Chest radiograph dated 07/12/2020. FINDINGS: No focal consolidation, pleural effusion, pneumothorax. There is mild prominence of the pulmonary  arteries bilaterally suggestive of pulmonary hypertension. Clinical correlation is recommended. The cardiac silhouette is limits. Atherosclerotic calcification of the aorta. No acute osseous pathology. IMPRESSION: No acute cardiopulmonary process. Electronically Signed   By: Anner Crete M.D.   On: 09/12/2020 19:52   ECHOCARDIOGRAM COMPLETE  Result Date: 09/13/2020    ECHOCARDIOGRAM REPORT   Patient Name:   JAH MCQUEARY Date of Exam: 09/13/2020 Medical Rec #:  [id number]       Height:       69.0 in Accession #:    [id number]      Weight:       155.0 lb Date of Birth:  1941/01/14        BSA:          1.854 m Patient Age:    41 years        BP:           100/60 mmHg Patient Gender: M               HR:           58 bpm. Exam Location:  Inpatient Procedure: 2D Echo Indications:    congestive heart failure  History:        Patient has no prior history of Echocardiogram examinations.                 COPD, Arrythmias:Atrial Fibrillation; Risk Factors:Hypertension                 and Dyslipidemia.  Sonographer:    Johny Chess RDCS Referring Phys: [id number] CARINA M BROWN IMPRESSIONS  1. Left ventricular ejection fraction, by estimation, is 40 to 45%. The left ventricle has mildly decreased function. The left ventricle demonstrates global hypokinesis. Left ventricular diastolic parameters are indeterminate.  2. Right ventricular systolic function is normal. The right ventricular size is normal. There is normal pulmonary artery systolic pressure.  3. Left atrial size was moderately dilated.  4. The mitral valve is grossly normal. Mild mitral valve regurgitation.  5. The aortic valve is calcified. Aortic valve regurgitation is not visualized. Aortic  valve sclerosis is present, with no evidence of aortic valve stenosis.  6. The inferior vena cava is dilated in size with >50% respiratory variability, suggesting right atrial pressure of 8 mmHg. Comparison(s): No prior Echocardiogram. FINDINGS  Left Ventricle: Left  ventricular ejection fraction, by estimation, is 40 to 45%. The left ventricle has mildly decreased function. The left ventricle demonstrates global hypokinesis. The left ventricular internal cavity size was normal in size. There is  no left ventricular hypertrophy. Left ventricular diastolic parameters are indeterminate. Right Ventricle: The right ventricular size is normal. Right vetricular wall thickness was not well visualized. Right ventricular systolic function is normal. There is normal pulmonary artery systolic pressure. The tricuspid regurgitant velocity is 2.61 m/s, and with an assumed right atrial pressure of 3 mmHg, the estimated right ventricular systolic pressure is 123XX123 mmHg. Left Atrium: Left atrial size was moderately dilated. Right Atrium: Right atrial size was normal in size. Pericardium: There is no evidence of pericardial effusion. Mitral Valve: The mitral valve is grossly normal. Mild mitral annular calcification. Mild mitral valve regurgitation. Tricuspid Valve: The tricuspid valve is normal in structure. Tricuspid valve regurgitation is trivial. Aortic Valve: The aortic valve is calcified. Aortic valve regurgitation is not visualized. Mild aortic valve sclerosis is present, with no evidence of aortic valve stenosis. Aortic valve mean gradient measures 7.8 mmHg. Aortic valve peak gradient measures 14.4 mmHg. Aortic valve area, by VTI measures 1.68 cm. Pulmonic Valve: The pulmonic valve was not well visualized. Pulmonic valve regurgitation is not visualized. No evidence of pulmonic stenosis. Aorta: The aortic root and ascending aorta are structurally normal, with no evidence of dilitation. Venous: The inferior vena cava is dilated in size with greater than 50% respiratory variability, suggesting right atrial pressure of 8 mmHg. IAS/Shunts: The atrial septum is grossly normal.  LEFT VENTRICLE PLAX 2D LVIDd:         5.50 cm LVIDs:         4.50 cm LV PW:         0.90 cm LV IVS:        0.70 cm  LVOT diam:     2.10 cm LV SV:         69 LV SV Index:   37 LVOT Area:     3.46 cm  IVC IVC diam: 2.20 cm LEFT ATRIUM             Index       RIGHT ATRIUM           Index LA diam:        4.10 cm 2.21 cm/m  RA Area:     18.30 cm LA Vol (A2C):   86.5 ml 46.66 ml/m RA Volume:   47.00 ml  25.35 ml/m LA Vol (A4C):   76.1 ml 41.05 ml/m LA Biplane Vol: 84.3 ml 45.47 ml/m  AORTIC VALVE AV Area (Vmax):    1.78 cm AV Area (Vmean):   1.75 cm AV Area (VTI):     1.68 cm AV Vmax:           189.75 cm/s AV Vmean:          124.275 cm/s AV VTI:            0.409 m AV Peak Grad:      14.4 mmHg AV Mean Grad:      7.8 mmHg LVOT Vmax:         97.78 cm/s LVOT Vmean:        62.750  cm/s LVOT VTI:          0.199 m LVOT/AV VTI ratio: 0.49  AORTA Ao Root diam: 3.30 cm Ao Asc diam:  3.50 cm TRICUSPID VALVE TR Peak grad:   27.2 mmHg TR Vmax:        261.00 cm/s  SHUNTS Systemic VTI:  0.20 m Systemic Diam: 2.10 cm Rudean Haskell MD Electronically signed by Rudean Haskell MD Signature Date/Time: 09/13/2020/2:47:21 PM    Final    DG FEMUR PORT, MIN 2 VIEWS RIGHT  Result Date: 09/12/2020 CLINICAL DATA:  Patient fell.  Pain in right leg. EXAM: RIGHT FEMUR PORTABLE 2 VIEW COMPARISON:  Same day CT of the femur FINDINGS: Moderately displaced, oblique impacted fracture of the mid femoral shaft, with the fracture line approximately 1 cm distal to the distal of the right hip prosthesis femoral component. The hip prosthesis is intact and in appropriate position within the acetabulum. The distal fracture fragment is displaced laterally approximately 3 cm with approximately 4.5 cm of fracture fragment overlap. The distal femur and knee are rotated laterally. Single surgical skin staple is noted at the level of the right hip joint. IMPRESSION: 1. Moderately displaced, oblique impacted fracture of the mid femoral shaft, approximately 1 cm distal to the tip of the right hip prosthesis femoral component. Please see today's dedicated CT femur  report for further details. 2. Right hip prosthesis is intact and in appropriate position. Electronically Signed   By: Ileana Roup M.D.   On: 09/12/2020 16:33     Assessment and Plan:   Chest tightness -Reported daily history of chest tightness.  This get worse with ambulation.  Associated with severe shortness of breath.  Stress test normal in 2017. Denies prior history of cardiac catheterization or stenting. -Certainly, patient has multiple cardiac risk factors.  Will need ischemic evaluation.  2.  Paroxysmal atrial fibrillation -Patient was in sinus rhythm when last seen by Dr. Bettina Gavia 09/2019.  Since then EKG showing atrial fibrillation.  Per prior note in Care Everywhere in 2018 by Dr. Luisa Dago, previously on amiodarone which was discontinued due to chronic lung disease.  However, currently patient on flecainide as well as amiodarone.  TSH is severely abnormal.  Heart rate in 50s to 60s. -Discontinue Cardizem  (soft BP and HR in 50-60s- he is not getting it) -Check Free T4 and T3 -Eliquis on hold  3.  Chronic systolic heart failure -Echocardiogram showed LV function of 40 to 45% and grade 2 diastolic dysfunction.  Global hypokinesis. -Prior echo in 2017 with normal LV function.   -We will review guideline directed medical therapy  4.  Surgical clearance -Patient was dealing with daily chest pain prior to admission.  Echocardiogram as above.  5.  Anemia -Hemoglobin dropping to 7.6 today.  Transfusion per primary team  6.  COPD -No active wheezing  7.  Alcohol abuse -Patient reports drinking 6 beers per day -Consider CIWA program  8.  Tobacco smoking -He smokes 1/2 pack to 2 packs/day -Recommended cessation  Dr. Radford Pax to see.  I will discontinue Cardizem.  Will review antiarrhythmic and other guideline directed medical therapy.  Ischemic evaluation and surgical clearance per MD.  Risk Assessment/Risk Scores:   HEAR Score (for undifferentiated chest pain):  HEAR Score:  6  New York Heart Association (NYHA) Functional Class NYHA Class III  CHA2DS2-VASc Score = 4  This indicates a 4.8% annual risk of stroke. The patient's score is based upon: CHF History: Yes HTN History: Yes Diabetes History:  No Stroke History: No Vascular Disease History: No Age Score: 2 Gender Score: 0   For questions or updates, please contact [place] Please consult [email or web address] for contact info under    Jarrett Soho, PA  09/13/2020 4:20 PM

## 2020-09-13 NOTE — Progress Notes (Signed)
Pt's BP found to be 82/46 manually.  Pt asymptomatic at this time.  Dr. Vanessa [place] notified and came to assess patient.  Bolus started per [facility]  Pt continues to be asymptomatic and in good spirits.  Will recheck BP when bolus completed per provider request.   09/13/20 0053  Notify: Provider  Provider Name/Title Vanessa [place], DO  Date Provider Notified 09/13/20  Time Provider Notified 0030  Notification Type Page  Notification Reason Other (Comment) (low BP)  Provider response At bedside  Date of Provider Response 09/13/20  Time of Provider Response [phone]

## 2020-09-13 NOTE — Progress Notes (Signed)
Went to evaluate patient alongside Dr. Zigmund Daniel after getting a page that the patient was hypotensive.  The patient's nurse had stated that the patient was not complaining of any symptoms at the time of checking her blood pressure and did not have access to a manual cuff.  Found patient resting in bed with no complaints other than his ongoing leg pain.  Blood pressure of 81/51 via automated cuff.  On my previous exam of the patient earlier tonight I believe the patient to be euvolemic but on further evaluation his mucous membranes do appear somewhat dry and he continues to have no pitting edema in his lower extremity.  The patient also states that he feels thirsty.  His renal function is appropriate though he does have a questionable history of HFpEF with a current echo pending.  We will gently hydrate the patient with 500 cc bolus of normal saline.  I spoke with the nurse that I would like to recheck his blood pressure once this completes.  Discussed with the patient to have a low threshold to let us know if he experiences any symptoms.  General: Alert and oriented in no apparent distress HEENT: Mucous membranes somewhat dry Heart: S1, S2, normal rate Lungs: Diminished breath sounds bilaterally with some fine crackles but no respiratory distress Skin: Warm and dry Extremities: No lower extremity edema, right leg in traction splint as before  A/P Hypotension, asymptomatic We will gently hydrate the patient with 500 cc bolus of normal saline.  We will recheck blood pressure after this.  Patient will let us know if he is experiencing any other symptoms and we will keep a close watch on him.  Echo read pending.

## 2020-09-13 NOTE — Progress Notes (Addendum)
Family Medicine Teaching Service Daily Progress Note Intern Pager: [phone]  Patient name: Kyle Farley Medical record number: [id number] Date of birth: Feb 26, 1940 Age: 80 y.o. Gender: male  Primary Care Provider: Zoila Shutter, NP Consultants: Orthopedics Code Status: Full code  Pt Overview and Major Events to Date:  8/26-admitted  Assessment and Plan: Kyle Farley is a 80 year old male presenting with a femur fracture of the right lower extremity and a pelvic rami fracture.  Past medical history significant for paroxysmal A. fib, COPD, ?HFpEF.  Right femur fracture  nondisplaced right inferior pubic ramus fracture Orthopedics on board.  Patient's pain seems well controlled at this time.  Awaiting orthopedic surgery.  Peripheral pulses are present bilaterally though the patient's are cool to the touch bilaterally. -Continue Tylenol every 4 hours -Continue Oxy 5 mg every 4 hours as needed -IV morphine every 4 hours as needed for breakthrough pain -Ortho on board, appreciate recommendations -Plan to order PT/OT evaluation following surgery  Hypotension: Blood pressure range of 80s-90s over 40s-50s overnight.  Responded well to a 500 cc fluid bolus. -Will reduce diltiazem to '30mg'$  q6hr  -Holding home lisinopril  History of COPD No complaints of shortness of breath at this time.  Continues with incentive spirometry and inhalers as ordered.  History of paroxysmal atrial fibrillation  ?HFpEF Follows with cardiology.  There was initially a question on which cardiac medications the patient was taking for rhythm control and rate control.  Patient states that he did not hear that his son had called.  He is not sure of his home medications.   -Spoke with pharmacy who confirmed he had been getting his flecainide and amiodarone filled. - Restarted flecainide and amiodarone -Echo ordered  Anemia Hemoglobin stable overnight from 9.0 > 9.1.  Morning hemoglobin of 8.5.   -A.m.  CBC  Hyponatremia Serum sodium of 128 > 127 >126 (corrects to 127).  Previous serum sodium of 136 back in March of last year.  This morning he appears more euvolemic the mucous membranes though he was drinking coffee at the time of the exam.  Bladder scan showed 348 mL in the bladder, per the nurse the patient was able to urinate over 100 mL after the bladder scan was complete. -Strict I's and O's -We will repeat bladder scan in about 6 hours -A.m. BMP  Hypocalcemia Calcium noted to be decreased at 6.9 on admission, improved to 8.1 on recheck.  A.m. calcium of 7.9.  Albumin of 2.2.  Calcium corrects to 9.3. -AM BMP  Chest pain: Noted in the H&P the patient had complained of some intermittent chest pain.  On further questioning the patient states he was having some chest pain in the past which sometimes was exertional and sometimes occur at rest.  He states that when it occurred resting did not seem to improve it.  He states that this is gone away since a medication adjustment via a cardiologist though he seems somewhat unsure about this.  He is not sure what medication was adjusted but denies chest pain since this medication adjustment occurred.  He states it has been in the past few weeks. This is somewhat strange because he follows with Dr. Bettina Gavia for cardiology and has not seen him in about a year. -Continue to monitor -Per attending will consult cardiology  Protein calorie malnutrition Albumin low at 2.2.  - Nutrition consult.   FEN/GI: Heart healthy diet PPx: SQ heparin Dispo:Home in 2-3 days. Barriers include ongoing medical work-up and upcoming  surgery.   Subjective:  Patient states that his pain is controlled at this time.  He denies any dizziness or lightheadedness.  He denies any chest pains.  He has no complaints.  Objective: Temp:  [97.6 F (36.4 C)-98.5 F (36.9 C)] 98.5 F (36.9 C) (08/26 2142) Pulse Rate:  [70-89] 70 (08/26 2142) Resp:  [16-23] 16 (08/26 2142) BP:  (102-132)/(62-84) 102/84 (08/26 2142) SpO2:  [90 %-99 %] 99 % (08/26 2142) Weight:  [70.3 kg] 70.3 kg (08/26 1315) Physical Exam: General: Elderly male lying in bed with right leg in a traction splint Cardiovascular: Irregular rhythm Respiratory: No increased work of breathing, breath sounds diminished similar to prior exam, no respiratory distress Abdomen: No abdominal discomfort to palpation Extremities: Pedal pulses appropriate in both legs  Laboratory: Recent Labs  Lab 09/12/20 1250 09/12/20 2150  WBC 8.2 9.9  HGB 9.0* 9.1*  HCT 27.4* 27.1*  PLT 174 196   Recent Labs  Lab 09/12/20 1250 09/12/20 2150  NA 128* 127*  K 3.6 4.1  CL 99 93*  CO2 21* 28  BUN 7* 8  CREATININE 0.78 0.90  CALCIUM 6.9* 8.1*  GLUCOSE 81 94     Wynston Romey, DO 09/13/2020, 12:02 AM PGY-3, [place] Intern pager: [phone], text pages welcome

## 2020-09-13 NOTE — Progress Notes (Addendum)
FPTS Interim Progress Note  S:Evaluated the patient at bedside. PRBC transfusing. Had just received 1x dose Oxycodone at 1646. Pain well-controlled at this moment. No chest pain, shortness of breath, nausea, vomiting, out of control pain. Discussed with patient and his son, Kerry Dory, that the operation scheduled for tomorrow morning with Dr. Doreatha Martin will likely be delayed due to stress test in the morning/pending results if patient is stable from a cardiac standpoint to tolerate surgery. Answered all questions from the patient. Patient and his son understood and agreed with the plan.  O: BP 103/66   Pulse 74   Temp 98.4 F (36.9 C) (Oral)   Resp 18   Ht '5\' 9"'$  (1.753 m)   Wt 70.3 kg   SpO2 97%   BMI 22.89 kg/m   Gen: Well-appearing, in no distress, conversational CV: Atrial fibrillation, HR 100-110bpm Resp: Bibasilar crackles, on 2L [place], No increased work of breathing MSK: Traction splint in place on left leg  A/P: Transfer to Progressive Transfusing 1u PRBC Post-transfusion H/H Nuclear stress test in am  Orvis Brill, DO 09/13/2020, 6:42 PM PGY-1, [facility] Medicine Service pager [phone]

## 2020-09-13 NOTE — Progress Notes (Signed)
Attending resident paged and informed of pt's Bp in 80's and rechecked manually  100/60, Pt asymptomatic,No complaints voiced.remains alert/oriented in no apparent distress. Per attending resident ok not to give cardizem.Also EKG done and resident MD has been informed with no new orders at this time. Pt closely monitored.

## 2020-09-13 NOTE — Progress Notes (Signed)
Initial Nutrition Assessment  DOCUMENTATION CODES:   Not applicable  INTERVENTION:   Ensure Enlive po BID, each supplement provides 350 kcal and 20 grams of protein MVI with minerals daily   NUTRITION DIAGNOSIS:   Increased nutrient needs related to hip fracture as evidenced by estimated needs.  GOAL:   Patient will meet greater than or equal to 90% of their needs  MONITOR:   PO intake, Supplement acceptance, Weight trends, Labs, I & O's  REASON FOR ASSESSMENT:   Consult Assessment of nutrition requirement/status  ASSESSMENT:   Patient with PMH significant for PAF, COPD, CHF, GERD, HLD, essential HTN, and ETOH use. Presents this admission with R periprosthetic femur fx and pelvic rami fx.  Patient denies decreased intake PTA. Typically consumes two meals daily that consist of B- eggs, bacon or sausage, grits and D- fast food burger and fries. Appetite stable this admission. Last meal completion charted as 75%. Discussed the importance of protein intake to promote post op healing. Patient amendable to Ensure.   Patient endorses a UBW of 165 lb and denies dry weight loss. Records indicate patient weighed 155 lb one year ago and show a stated weight of 155 lb this admission. Will need to obtain actual weight to assess for weight loss.   Medications: reviewed  Labs: Na 126 (L)   Diet Order:   Diet Order             Diet NPO time specified Except for: Sips with Meds  Diet effective midnight           Diet Heart Room service appropriate? Yes; Fluid consistency: Thin  Diet effective now                   EDUCATION NEEDS:   Education needs have been addressed  Skin:  Skin Assessment: Reviewed RN Assessment  Last BM:  8/26  Height:   Ht Readings from Last 1 Encounters:  09/12/20 '5\' 9"'$  (1.753 m)    Weight:   Wt Readings from Last 1 Encounters:  09/12/20 70.3 kg    BMI:  Body mass index is 22.89 kg/m.  Estimated Nutritional Needs:   Kcal:  1800-2000  kcal  Protein:  90-105 grams  Fluid:  >/= 1.8 L/day  Mariana Single MS, RD, LDN, CNSC Clinical Nutrition Pager listed in [place]

## 2020-09-13 NOTE — Progress Notes (Signed)
FPTS Brief  Progress Note  Called patient's son to update him on plan of care.  Discussed that given patient's cardiac history and symptoms that he reported overnight and this morning we had cardiology see him.  Discussed that this would likely delay surgery till later tomorrow or most likely Monday.  Also discussed need for blood transfusion.  Patient will be moving to progressive care unit.  Also updated Dr. Doreatha Martin on care plan.  Appreciate orthopedic and cardiology consultation in the care of this patient.  Dorris Singh, MD  Family Medicine Teaching Service

## 2020-09-13 NOTE — Progress Notes (Signed)
  Echocardiogram 2D Echocardiogram has been performed.  Johny Chess 09/13/2020, 2:34 PM

## 2020-09-13 NOTE — Progress Notes (Signed)
FPTS Interim Progress Note  S: Patient evaluated at bedside. Denies any chest pain or worsening dyspnea. Denies any source of bleeding that he has noticed. States that he feels fine and is hoping that his nuclear stress test is earlier tomorrow. Feels that his pain has improved from a 15/10 to now a 6/10. No concerns at this time.   O: BP 111/75 (BP Location: Right Arm)   Pulse 77   Temp 97.8 F (36.6 C) (Oral)   Resp 18   Ht '5\' 9"'$  (1.753 m)   Wt 70.3 kg   SpO2 100%   BMI 22.89 kg/m   General: Patient laying comfortably in bed watching tv, in no acute distress. CV: irregularly irregular rhythm, no murmurs noted Resp: normal work of breathing noted on 3L O2, no wheezing, rales or rhonchi noted Abdomen: soft, nontender, presence of bowel sounds Ext: radial and distal pulses strong and equal bilaterally, no ecchymosis, active bleeding or pressure ulcers noted along trochanter area bilaterally GU: no rectal bleeding noted, normal external male genitalia  Neuro: answers all questions appropriately  Psych: mood appropriate, pleasant   GU exam performed in the presence of chaperone, nurse tech.   A/P: -NPO at midnight for nuclear stress testing tomorrow for surgical clearance for potential surgery later in the week.  -most recent Hgb 7.6, receiving 1 unit pRBC, awaiting post transfusion hemoglobin, transfusion threshold 8. Reassuringly that no known source of bleeding noted. Plan to transfuse if repeat Hgb remains below threshold. Otherwise remains asymptomatic.   Donney Dice, DO 09/13/2020, 10:07 PM PGY-2, [facility] Medicine Service pager [phone]

## 2020-09-14 DIAGNOSIS — I10 Essential (primary) hypertension: Secondary | ICD-10-CM | POA: Diagnosis not present

## 2020-09-14 DIAGNOSIS — I420 Dilated cardiomyopathy: Secondary | ICD-10-CM | POA: Diagnosis not present

## 2020-09-14 DIAGNOSIS — I5021 Acute systolic (congestive) heart failure: Secondary | ICD-10-CM

## 2020-09-14 DIAGNOSIS — J449 Chronic obstructive pulmonary disease, unspecified: Secondary | ICD-10-CM | POA: Diagnosis not present

## 2020-09-14 DIAGNOSIS — S7291XA Unspecified fracture of right femur, initial encounter for closed fracture: Secondary | ICD-10-CM | POA: Diagnosis not present

## 2020-09-14 DIAGNOSIS — I509 Heart failure, unspecified: Secondary | ICD-10-CM | POA: Diagnosis not present

## 2020-09-14 DIAGNOSIS — I482 Chronic atrial fibrillation, unspecified: Secondary | ICD-10-CM | POA: Diagnosis not present

## 2020-09-14 DIAGNOSIS — Z7289 Other problems related to lifestyle: Secondary | ICD-10-CM | POA: Diagnosis not present

## 2020-09-14 LAB — CBC
HCT: 25.7 % — ABNORMAL LOW (ref 39.0–52.0)
HCT: 23.3 % — ABNORMAL LOW (ref 39.0–52.0)
Hemoglobin: 8.5 g/dL — ABNORMAL LOW (ref 13.0–17.0)
Hemoglobin: 7.5 g/dL — ABNORMAL LOW (ref 13.0–17.0)
MCH: 28.9 pg (ref 26.0–34.0)
MCH: 28.5 pg (ref 26.0–34.0)
MCHC: 33.1 g/dL (ref 30.0–36.0)
MCHC: 32.2 g/dL (ref 30.0–36.0)
MCV: 87.4 fL (ref 80.0–100.0)
MCV: 88.6 fL (ref 80.0–100.0)
Platelets: 157 10*3/uL (ref 150–400)
Platelets: 173 10*3/uL (ref 150–400)
RBC: 2.94 MIL/uL — ABNORMAL LOW (ref 4.22–5.81)
RBC: 2.63 MIL/uL — ABNORMAL LOW (ref 4.22–5.81)
RDW: 15.7 % — ABNORMAL HIGH (ref 11.5–15.5)
RDW: 15.6 % — ABNORMAL HIGH (ref 11.5–15.5)
WBC: 8.2 10*3/uL (ref 4.0–10.5)
WBC: 7.2 10*3/uL (ref 4.0–10.5)
nRBC: 0 % (ref 0.0–0.2)
nRBC: 0 % (ref 0.0–0.2)

## 2020-09-14 LAB — BASIC METABOLIC PANEL
Anion gap: 7 (ref 5–15)
BUN: 10 mg/dL (ref 8–23)
CO2: 25 mmol/L (ref 22–32)
Calcium: 8.3 mg/dL — ABNORMAL LOW (ref 8.9–10.3)
Chloride: 94 mmol/L — ABNORMAL LOW (ref 98–111)
Creatinine, Ser: 0.92 mg/dL (ref 0.61–1.24)
GFR, Estimated: >60 mL/min (ref >60)
Glucose, Bld: 104 mg/dL — ABNORMAL HIGH (ref 70–99)
Potassium: 4 mmol/L (ref 3.5–5.1)
Sodium: 126 mmol/L — ABNORMAL LOW (ref 135–145)

## 2020-09-14 LAB — PREPARE RBC (CROSSMATCH)

## 2020-09-14 MED ORDER — LIDOCAINE 5 % EX PTCH
1.0000 | MEDICATED_PATCH | CUTANEOUS | Status: DC
  Administered 2020-09-14 – 2020-09-19 (×6): 1 via TRANSDERMAL
  Filled 2020-09-14 (×6): qty 1

## 2020-09-14 MED ORDER — SODIUM CHLORIDE 0.9% FLUSH
3.0000 mL | Freq: Two times a day (BID) | INTRAVENOUS | Status: DC
Start: 2020-09-14 — End: 2020-09-15
  Administered 2020-09-14 (×2): 3 mL via INTRAVENOUS

## 2020-09-14 MED ORDER — METOPROLOL TARTRATE 12.5 MG HALF TABLET: 12.5000 mg | ORAL_TABLET | Freq: Two times a day (BID) | ORAL | Status: DC

## 2020-09-14 MED ORDER — SODIUM CHLORIDE 0.9% IV SOLUTION: Freq: Once | INTRAVENOUS | Status: AC

## 2020-09-14 MED ORDER — METOPROLOL TARTRATE 12.5 MG HALF TABLET
12.5000 mg | ORAL_TABLET | Freq: Every day | ORAL | Status: DC
  Administered 2020-09-15: 12.5 mg via ORAL
  Filled 2020-09-14: qty 1

## 2020-09-14 MED ORDER — ATORVASTATIN CALCIUM 40 MG PO TABS
40.0000 mg | ORAL_TABLET | Freq: Every day | ORAL | Status: DC
  Administered 2020-09-14 – 2020-09-20 (×6): 40 mg via ORAL
  Filled 2020-09-14 (×6): qty 1

## 2020-09-14 MED ORDER — LOSARTAN POTASSIUM 50 MG PO TABS
25.0000 mg | ORAL_TABLET | Freq: Every day | ORAL | Status: DC
  Administered 2020-09-14: 25 mg via ORAL
  Filled 2020-09-14: qty 1

## 2020-09-14 MED ORDER — METOPROLOL TARTRATE 12.5 MG HALF TABLET
12.5000 mg | ORAL_TABLET | Freq: Two times a day (BID) | ORAL | Status: DC
  Administered 2020-09-14: 12.5 mg via ORAL
  Filled 2020-09-14: qty 1

## 2020-09-14 MED ORDER — OXYCODONE HCL 5 MG PO TABS: 2.5000 mg | ORAL_TABLET | ORAL | Status: DC | PRN

## 2020-09-14 MED ORDER — ASPIRIN EC 81 MG PO TBEC
81.0000 mg | DELAYED_RELEASE_TABLET | Freq: Every day | ORAL | Status: DC
  Administered 2020-09-14 – 2020-09-20 (×7): 81 mg via ORAL
  Filled 2020-09-14 (×7): qty 1

## 2020-09-14 NOTE — Progress Notes (Signed)
Pt bp 97/46. Dr. Owens Shark and Dr. Radford Pax made aware. Manual bp 92/45. Pt asymptomatic. No new orders at this time.   Justice Rocher, RN

## 2020-09-14 NOTE — Progress Notes (Signed)
RN offered pt bath and linen change and he denied d/t leg pain and not wanting to turn.   Justice Rocher, RN

## 2020-09-14 NOTE — Progress Notes (Addendum)
Progress Note  Patient Name: Kyle Farley Date of Encounter: 09/14/2020  [facility] Cardiologist: Shirlee More, MD   Subjective   Denies any further chest pain.   Inpatient Medications    Scheduled Meds:  acetaminophen  650 mg Oral Q4H   feeding supplement  237 mL Oral BID BM   fluticasone furoate-vilanterol  1 puff Inhalation Daily   levothyroxine  50 mcg Oral QAC breakfast   multivitamin with minerals  1 tablet Oral Daily   pantoprazole  40 mg Oral Daily   tamsulosin  0.4 mg Oral Daily   umeclidinium bromide  1 puff Inhalation Daily   Continuous Infusions:  PRN Meds: albuterol, morphine injection, oxyCODONE   Vital Signs    Vitals:   09/14/20 0019 09/14/20 0327 09/14/20 0722 09/14/20 0843  BP: 128/79 (!) 148/80 129/83   Pulse: 79 81 93   Resp: '19 20 15   '$ Temp: 98.6 F (37 C) 98.6 F (37 C) 97.8 F (36.6 C)   TempSrc: Oral Oral Oral   SpO2: 99% 99% 94% 95%  Weight:      Height:        Intake/Output Summary (Last 24 hours) at 09/14/2020 0926 Last data filed at 09/14/2020 0749 Gross per 24 hour  Intake 120 ml  Output 2050 ml  Net -1930 ml   Last 3 Weights 09/12/2020 10/10/2019 04/12/2019  Weight (lbs) 155 lb 156 lb 164 lb  Weight (kg) 70.308 kg 70.761 kg 74.39 kg      Telemetry    Atrial fibrillation with CVR - Personally Reviewed  ECG    No new EKG to review - Personally Reviewed  Physical Exam   GEN: No acute distress.   Neck: No JVD Cardiac: irregularly irregular, no murmurs, rubs, or gallops.  Respiratory: Clear to auscultation bilaterally. GI: Soft, nontender, non-distended  MS: No edema; left leg in traction Neuro:  Nonfocal  Psych: Normal affect   Labs    High Sensitivity Troponin:  No results for input(s): TROPONINIHS in the last 720 hours.    Chemistry Recent Labs  Lab 09/13/20 0018 09/13/20 1514 09/14/20 0350  NA 126* 125* 126*  K 3.7 4.0 4.0  CL 94* 93* 94*  CO2 '27 26 25  '$ GLUCOSE 133* 97 104*  BUN 7* 11 10   CREATININE 0.86 0.98 0.92  CALCIUM 7.9* 8.0* 8.3*  PROT 4.8* 4.7*  --   ALBUMIN 2.2* 2.1*  --   AST 16 15  --   ALT 11 11  --   ALKPHOS 105 93  --   BILITOT 0.6 0.7  --   GFRNONAA >60 >60 >60  ANIONGAP '5 6 7     '$ Hematology Recent Labs  Lab 09/13/20 0018 09/13/20 1514 09/14/20 0350  WBC 8.9 8.0 8.2  RBC 2.97* 2.68* 2.94*  HGB 8.5* 7.6* 8.5*  HCT 26.5* 24.1* 25.7*  MCV 89.2 89.9 87.4  MCH 28.6 28.4 28.9  MCHC 32.1 31.5 33.1  RDW 15.3 15.6* 15.7*  PLT 198 181 157    BNP Recent Labs  Lab 09/13/20 2237  BNP 230.4*     DDimer No results for input(s): DDIMER in the last 168 hours.   CHA2DS2-VASc Score = 4  This indicates a 4.8% annual risk of stroke. The patient's score is based upon: CHF History: Yes HTN History: Yes Diabetes History: No Stroke History: No Vascular Disease History: No Age Score: 2 Gender Score: 0    Radiology    CT HEAD WO CONTRAST (  5MM)  Result Date: 09/12/2020 CLINICAL DATA:  Head injury after fall. EXAM: CT HEAD WITHOUT CONTRAST TECHNIQUE: Contiguous axial images were obtained from the base of the skull through the vertex without intravenous contrast. COMPARISON:  May 21, 2020. FINDINGS: Brain: Mild chronic ischemic white matter disease is noted. No mass effect or midline shift is noted. Ventricular size is within normal limits. There is no evidence of mass lesion, hemorrhage or acute infarction. Vascular: No hyperdense vessel or unexpected calcification. Skull: Normal. Negative for fracture or focal lesion. Sinuses/Orbits: No acute finding. Other: Fluid is noted in right mastoid air cells. IMPRESSION: No acute intracranial abnormality seen. Electronically Signed   By: Marijo Conception M.D.   On: 09/12/2020 13:58   CT PELVIS WO CONTRAST  Result Date: 09/12/2020 CLINICAL DATA:  Pelvic trauma suspected fracture or dislocation of right prosthetic hip s/p fall EXAM: CT PELVIS WITHOUT CONTRAST TECHNIQUE: Multidetector CT imaging of the pelvis was  performed following the standard protocol without intravenous contrast. COMPARISON:  Radiograph 07/14/2020 FINDINGS: Bones/Joint/Cartilage There is a partially visualized periprosthetic fracture along the right femoral stem extending through the lesser trochanter. The femoral head component is well seated within the acetabular cup. There is a nondisplaced right inferior pubic ramus fracture (series 4, image 395). There is no pubic diastasis. No SI joint widening. There is heterotopic ossification along the anterior aspect of the right hip. 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 There is bilateral SI joint degenerative change. There is lower lumbar spine degenerative changes with posterior disc bulging and bilateral facet arthropathy likely resulting in mild-to-moderate spinal canal and neural foraminal narrowing. Ligaments Suboptimally assessed by CT. Muscles and Tendons No significant muscle atrophy. Soft tissues There is soft tissue swelling along the right hip. Abdominopelvic structures Unchanged nonobstructive right renal calculi. 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 Please see recommendations on prior CT abdomen pelvis on February 09, 2020. There is intraluminal gas within the bladder, presumably from recent catheterization. IMPRESSION: Partially visualized periprosthetic fracture along the right femoral stem, which extends through the lesser trochanter. See separately dictated right femur CT for further description. Nondisplaced right inferior pubic ramus fracture. Electronically Signed   By: Maurine Simmering M.D.   On: 09/12/2020 14:11   CT FEMUR RIGHT WO CONTRAST  Result Date: 09/12/2020 CLINICAL DATA:  Fall, trauma EXAM: CT OF THE LOWER RIGHT EXTREMITY WITHOUT CONTRAST TECHNIQUE: Multidetector CT imaging of the right lower extremity was performed according to the standard protocol. COMPARISON:  Radiograph 07/14/2020 FINDINGS: Bones/Joint/Cartilage There is a right  hip arthroplasty in normal alignment with femoral head component is seated within the acetabular cup. There is a periprosthetic fracture which courses through the lesser trochanter through the subtrochanteric femur posteriorly, and with a severely displaced obliquely oriented spiral fracture distal to the femoral stem, displaced proximally 2.4 cm laterally and with foreshortening. Ligaments Suboptimally assessed by CT. Muscles and Tendons No atrophy. Soft tissues Extensive soft tissue swelling.  Vascular calcifications. IMPRESSION: Periprosthetic fracture along the right femoral stem, with nondisplaced component proximally extending through the lesser trochanter, and a severely displaced component distal to the femoral stem with foreshortening. Electronically Signed   By: Maurine Simmering M.D.   On: 09/12/2020 17:07   DG CHEST PORT 1 VIEW  Result Date: 09/12/2020 CLINICAL DATA:  Fall. EXAM: PORTABLE CHEST 1 VIEW COMPARISON:  Chest radiograph dated 07/12/2020. FINDINGS: No focal consolidation, pleural effusion, pneumothorax. There is mild prominence of the pulmonary arteries bilaterally suggestive of pulmonary hypertension. Clinical  correlation is recommended. The cardiac silhouette is limits. Atherosclerotic calcification of the aorta. No acute osseous pathology. IMPRESSION: No acute cardiopulmonary process. Electronically Signed   By: Anner Crete M.D.   On: 09/12/2020 19:52   ECHOCARDIOGRAM COMPLETE  Result Date: 09/13/2020    ECHOCARDIOGRAM REPORT   Patient Name:   Kyle Farley Date of Exam: 09/13/2020 Medical Rec #:  [id number]       Height:       69.0 in Accession #:    [id number]      Weight:       155.0 lb Date of Birth:  [birth year]        BSA:          1.854 m Patient Age:    15 years        BP:           100/60 mmHg Patient Gender: M               HR:           58 bpm. Exam Location:  Inpatient Procedure: 2D Echo Indications:    congestive heart failure  History:        Patient has no prior history  of Echocardiogram examinations.                 COPD, Arrythmias:Atrial Fibrillation; Risk Factors:Hypertension                 and Dyslipidemia.  Sonographer:    Johny Chess RDCS Referring Phys: [id number] CARINA M BROWN IMPRESSIONS  1. Left ventricular ejection fraction, by estimation, is 40 to 45%. The left ventricle has mildly decreased function. The left ventricle demonstrates global hypokinesis. Left ventricular diastolic parameters are indeterminate.  2. Right ventricular systolic function is normal. The right ventricular size is normal. There is normal pulmonary artery systolic pressure.  3. Left atrial size was moderately dilated.  4. The mitral valve is grossly normal. Mild mitral valve regurgitation.  5. The aortic valve is calcified. Aortic valve regurgitation is not visualized. Aortic valve sclerosis is present, with no evidence of aortic valve stenosis.  6. The inferior vena cava is dilated in size with >50% respiratory variability, suggesting right atrial pressure of 8 mmHg. Comparison(s): No prior Echocardiogram. FINDINGS  Left Ventricle: Left ventricular ejection fraction, by estimation, is 40 to 45%. The left ventricle has mildly decreased function. The left ventricle demonstrates global hypokinesis. The left ventricular internal cavity size was normal in size. There is  no left ventricular hypertrophy. Left ventricular diastolic parameters are indeterminate. Right Ventricle: The right ventricular size is normal. Right vetricular wall thickness was not well visualized. Right ventricular systolic function is normal. There is normal pulmonary artery systolic pressure. The tricuspid regurgitant velocity is 2.61 m/s, and with an assumed right atrial pressure of 3 mmHg, the estimated right ventricular systolic pressure is 123XX123 mmHg. Left Atrium: Left atrial size was moderately dilated. Right Atrium: Right atrial size was normal in size. Pericardium: There is no evidence of pericardial effusion.  Mitral Valve: The mitral valve is grossly normal. Mild mitral annular calcification. Mild mitral valve regurgitation. Tricuspid Valve: The tricuspid valve is normal in structure. Tricuspid valve regurgitation is trivial. Aortic Valve: The aortic valve is calcified. Aortic valve regurgitation is not visualized. Mild aortic valve sclerosis is present, with no evidence of aortic valve stenosis. Aortic valve mean gradient measures 7.8 mmHg. Aortic valve peak gradient measures 14.4 mmHg. Aortic valve area, by VTI measures 1.68 cm.  Pulmonic Valve: The pulmonic valve was not well visualized. Pulmonic valve regurgitation is not visualized. No evidence of pulmonic stenosis. Aorta: The aortic root and ascending aorta are structurally normal, with no evidence of dilitation. Venous: The inferior vena cava is dilated in size with greater than 50% respiratory variability, suggesting right atrial pressure of 8 mmHg. IAS/Shunts: The atrial septum is grossly normal.  LEFT VENTRICLE PLAX 2D LVIDd:         5.50 cm LVIDs:         4.50 cm LV PW:         0.90 cm LV IVS:        0.70 cm LVOT diam:     2.10 cm LV SV:         69 LV SV Index:   37 LVOT Area:     3.46 cm  IVC IVC diam: 2.20 cm LEFT ATRIUM             Index       RIGHT ATRIUM           Index LA diam:        4.10 cm 2.21 cm/m  RA Area:     18.30 cm LA Vol (A2C):   86.5 ml 46.66 ml/m RA Volume:   47.00 ml  25.35 ml/m LA Vol (A4C):   76.1 ml 41.05 ml/m LA Biplane Vol: 84.3 ml 45.47 ml/m  AORTIC VALVE AV Area (Vmax):    1.78 cm AV Area (Vmean):   1.75 cm AV Area (VTI):     1.68 cm AV Vmax:           189.75 cm/s AV Vmean:          124.275 cm/s AV VTI:            0.409 m AV Peak Grad:      14.4 mmHg AV Mean Grad:      7.8 mmHg LVOT Vmax:         97.78 cm/s LVOT Vmean:        62.750 cm/s LVOT VTI:          0.199 m LVOT/AV VTI ratio: 0.49  AORTA Ao Root diam: 3.30 cm Ao Asc diam:  3.50 cm TRICUSPID VALVE TR Peak grad:   27.2 mmHg TR Vmax:        261.00 cm/s  SHUNTS Systemic  VTI:  0.20 m Systemic Diam: 2.10 cm Rudean Haskell MD Electronically signed by Rudean Haskell MD Signature Date/Time: 09/13/2020/2:47:21 PM    Final    DG FEMUR PORT, MIN 2 VIEWS RIGHT  Result Date: 09/12/2020 CLINICAL DATA:  Patient fell.  Pain in right leg. EXAM: RIGHT FEMUR PORTABLE 2 VIEW COMPARISON:  Same day CT of the femur FINDINGS: Moderately displaced, oblique impacted fracture of the mid femoral shaft, with the fracture line approximately 1 cm distal to the distal of the right hip prosthesis femoral component. The hip prosthesis is intact and in appropriate position within the acetabulum. The distal fracture fragment is displaced laterally approximately 3 cm with approximately 4.5 cm of fracture fragment overlap. The distal femur and knee are rotated laterally. Single surgical skin staple is noted at the level of the right hip joint. IMPRESSION: 1. Moderately displaced, oblique impacted fracture of the mid femoral shaft, approximately 1 cm distal to the tip of the right hip prosthesis femoral component. Please see today's dedicated CT femur report for further details. 2. Right hip prosthesis is intact and in appropriate position. Electronically  Signed   By: Ileana Roup M.D.   On: 09/12/2020 16:33    Cardiac Studies   2D echo 09/13/2020 IMPRESSIONS    1. Left ventricular ejection fraction, by estimation, is 40 to 45%. The  left ventricle has mildly decreased function. The left ventricle  demonstrates global hypokinesis. Left ventricular diastolic parameters are  indeterminate.   2. Right ventricular systolic function is normal. The right ventricular  size is normal. There is normal pulmonary artery systolic pressure.   3. Left atrial size was moderately dilated.   4. The mitral valve is grossly normal. Mild mitral valve regurgitation.   5. The aortic valve is calcified. Aortic valve regurgitation is not  visualized. Aortic valve sclerosis is present, with no evidence of aortic   valve stenosis.   6. The inferior vena cava is dilated in size with >50% respiratory  variability, suggesting right atrial pressure of 8 mmHg.   Comparison(s): No prior Echocardiogram.   Patient Profile     80 y.o. male with a hx of paroxysmal atrial fibrillation, hypertension, hyperlipidemia, COPD, AAA and smoker who is being seen 09/13/2020 for the evaluation of preop clearance at the request of Dr. Owens Shark.  Assessment & Plan    Chest tightness -Reported daily history of chest tightness.  This get worse with ambulation.  Associated with severe shortness of breath.  Stress test normal in 2017. Denies prior history of cardiac catheterization or stenting. -Certainly, patient has multiple cardiac risk factors.  -2D echo showed mild LV dysfunction with EF 40-45% with global HK -given frequency of CP and abnormal LVF he needs ischemic workup>>could consider Lexiscan myoview but may be normal in setting of severe 3v CAD, therefore will proceed with [facility] in am to define coronary anatomy and rule out high risk anatomy prior to surgery for leg -Shared Decision Making/Informed Consent The risks [stroke (1 in 1000), death (1 in 1000), kidney failure [usually temporary] (1 in 500), bleeding (1 in 200), allergic reaction [possibly serious] (1 in 200)], benefits (diagnostic support and management of coronary artery disease) and alternatives of a cardiac catheterization were discussed in detail with Mr. Lariscy and he is willing to proceed.  -start ASA '81mg'$  daily and Lopressor 12.'5mg'$  BID -check FLP in am -add Lipitor '40mg'$  daily for now (this is also on his home med sheet but not verified) -no IV Heparin gtt at present due to significant anemia   2.  Persistent atrial fibrillation -Patient was in sinus rhythm when last seen by Dr. Bettina Gavia 09/2019.  Since then EKG showing atrial fibrillation.  Per prior note in Care Everywhere in 2018 by Dr. Luisa Dago, previously on amiodarone which was discontinued due to chronic  lung disease.  However, currently patient on flecainide as well as amiodarone.  Patient tells me that he thinks his nurse caregiver told him to restart Amio because he was having CP -TSH is severely abnormal.   -Heart rate was in 50s to 60s but now in the 70-80's after stopping Cardizem, Amio -stopped Amio as well as Flecainide since he has been in afib for some time now>>this can be addressed outpt -Eliquis on hold for surgery -no IV heparin at present due to significant anemia   3.  Acute systolic heart failure -Echocardiogram showed LV function of 40 to 45% and grade 2 diastolic dysfunction.  Global hypokinesis. -Prior echo in 2017 with normal LV function.   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  Surgical clearance -Patient was dealing with daily chest pain prior to admission.   -Echocardiogram as above. -surgery on hold for his leg until coronary ischemic workup completed   5.  Anemia -Hemoglobin dropping to 7.6 yesterday but up to 8.5 today   -?related to bleeding from fall and fracture -Transfusion per primary team   6.  COPD -No active wheezing   7.  Alcohol abuse -Patient reports drinking 6 beers per day -Consider CIWA program   8.  Tobacco smoking -He smokes 1/2 pack to 2 packs/day -Recommended cessation  9.  Elevated TSH -? Related to Amio -FT4 low  -per [facility]    I have spent a total of 35 minutes with patient reviewing 2D echo , telemetry, EKGs, labs and examining patient as well as establishing  an assessment and plan that was discussed with the patient.  > 50% of time was spent in direct patient care.     For questions or updates, please contact [place] Please consult [email or web address] for contact info under        Signed, Fransico Him, MD  09/14/2020, 9:26 AM

## 2020-09-14 NOTE — Progress Notes (Signed)
FPTS Brief Progress Note  S Saw patient at bedside this evening. Patient was resting comfortably.  Left hip pain currently 1/10 severity.  Pain has been effectively relieved by oxycodone.  Patient has no concerns this evening   O: BP 102/62   Pulse 74   Temp 98.4 F (36.9 C) (Oral)   Resp 19   Ht '5\' 9"'$  (1.753 m)   Wt 70.3 kg   SpO2 97%   BMI 22.89 kg/m    General: Alert, no acute distress, pleasant, nasal cannula  Cardio: Well-perfused Pulm: normal work of breathing Abdomen: Bowel sounds normal. Abdomen moderately distended, nontender Extremities: Minimal tenderness over right groin and greater trochanter, bruising present, dressings clean and dry Neuro: Cranial nerves grossly intact   A/P: Plan per day team  Symptomatic anemia likely secondary to right femur fracture, nondisplaced right inferior pubic rami fracture Hb 7.5 this evening, dropped from 8.5 this morning. S/p 1 URBC  -Transfuse 1 unit RBC -Follow-up H&H -CT angio pelvis overnight if patient becomes hemodynamically unstable -Orthopedic procedure tomorrow at 12 PM. -N.p.o. from midnight  Soft blood pressures Blood pressure 102-62, improved from earlier blood pressures -Continue to monitor -Oxycodone 2.'5mg'$ -2 5 mg every 4 as needed for pain  - Orders reviewed. Labs for AM ordered, which was adjusted as needed.  - If condition changes, plan includes CT angio pelvis.   Lattie Haw, MD 09/14/2020, 10:43 PM PGY-3, [facility] Night Resident  Please page [phone] with questions.

## 2020-09-14 NOTE — H&P (View-Only) (Signed)
Progress Note  Patient Name: Kyle Farley Date of Encounter: 09/14/2020  [facility] Cardiologist: Shirlee More, MD   Subjective   Denies any further chest pain.   Inpatient Medications    Scheduled Meds:  acetaminophen  650 mg Oral Q4H   feeding supplement  237 mL Oral BID BM   fluticasone furoate-vilanterol  1 puff Inhalation Daily   levothyroxine  50 mcg Oral QAC breakfast   multivitamin with minerals  1 tablet Oral Daily   pantoprazole  40 mg Oral Daily   tamsulosin  0.4 mg Oral Daily   umeclidinium bromide  1 puff Inhalation Daily   Continuous Infusions:  PRN Meds: albuterol, morphine injection, oxyCODONE   Vital Signs    Vitals:   09/14/20 0019 09/14/20 0327 09/14/20 0722 09/14/20 0843  BP: 128/79 (!) 148/80 129/83   Pulse: 79 81 93   Resp: '19 20 15   '$ Temp: 98.6 F (37 C) 98.6 F (37 C) 97.8 F (36.6 C)   TempSrc: Oral Oral Oral   SpO2: 99% 99% 94% 95%  Weight:      Height:        Intake/Output Summary (Last 24 hours) at 09/14/2020 0926 Last data filed at 09/14/2020 0749 Gross per 24 hour  Intake 120 ml  Output 2050 ml  Net -1930 ml   Last 3 Weights 09/12/2020 10/10/2019 04/12/2019  Weight (lbs) 155 lb 156 lb 164 lb  Weight (kg) 70.308 kg 70.761 kg 74.39 kg      Telemetry    Atrial fibrillation with CVR - Personally Reviewed  ECG    No new EKG to review - Personally Reviewed  Physical Exam   GEN: No acute distress.   Neck: No JVD Cardiac: irregularly irregular, no murmurs, rubs, or gallops.  Respiratory: Clear to auscultation bilaterally. GI: Soft, nontender, non-distended  MS: No edema; left leg in traction Neuro:  Nonfocal  Psych: Normal affect   Labs    High Sensitivity Troponin:  No results for input(s): TROPONINIHS in the last 720 hours.    Chemistry Recent Labs  Lab 09/13/20 0018 09/13/20 1514 09/14/20 0350  NA 126* 125* 126*  K 3.7 4.0 4.0  CL 94* 93* 94*  CO2 '27 26 25  '$ GLUCOSE 133* 97 104*  BUN 7* 11 10   CREATININE 0.86 0.98 0.92  CALCIUM 7.9* 8.0* 8.3*  PROT 4.8* 4.7*  --   ALBUMIN 2.2* 2.1*  --   AST 16 15  --   ALT 11 11  --   ALKPHOS 105 93  --   BILITOT 0.6 0.7  --   GFRNONAA >60 >60 >60  ANIONGAP '5 6 7     '$ Hematology Recent Labs  Lab 09/13/20 0018 09/13/20 1514 09/14/20 0350  WBC 8.9 8.0 8.2  RBC 2.97* 2.68* 2.94*  HGB 8.5* 7.6* 8.5*  HCT 26.5* 24.1* 25.7*  MCV 89.2 89.9 87.4  MCH 28.6 28.4 28.9  MCHC 32.1 31.5 33.1  RDW 15.3 15.6* 15.7*  PLT 198 181 157    BNP Recent Labs  Lab 09/13/20 2237  BNP 230.4*     DDimer No results for input(s): DDIMER in the last 168 hours.   CHA2DS2-VASc Score = 4  This indicates a 4.8% annual risk of stroke. The patient's score is based upon: CHF History: Yes HTN History: Yes Diabetes History: No Stroke History: No Vascular Disease History: No Age Score: 2 Gender Score: 0    Radiology    CT HEAD WO CONTRAST (  5MM)  Result Date: 09/12/2020 CLINICAL DATA:  Head injury after fall. EXAM: CT HEAD WITHOUT CONTRAST TECHNIQUE: Contiguous axial images were obtained from the base of the skull through the vertex without intravenous contrast. COMPARISON:  May 21, 2020. FINDINGS: Brain: Mild chronic ischemic white matter disease is noted. No mass effect or midline shift is noted. Ventricular size is within normal limits. There is no evidence of mass lesion, hemorrhage or acute infarction. Vascular: No hyperdense vessel or unexpected calcification. Skull: Normal. Negative for fracture or focal lesion. Sinuses/Orbits: No acute finding. Other: Fluid is noted in right mastoid air cells. IMPRESSION: No acute intracranial abnormality seen. Electronically Signed   By: Marijo Conception M.D.   On: 09/12/2020 13:58   CT PELVIS WO CONTRAST  Result Date: 09/12/2020 CLINICAL DATA:  Pelvic trauma suspected fracture or dislocation of right prosthetic hip s/p fall EXAM: CT PELVIS WITHOUT CONTRAST TECHNIQUE: Multidetector CT imaging of the pelvis was  performed following the standard protocol without intravenous contrast. COMPARISON:  Radiograph 07/14/2020 FINDINGS: Bones/Joint/Cartilage There is a partially visualized periprosthetic fracture along the right femoral stem extending through the lesser trochanter. The femoral head component is well seated within the acetabular cup. There is a nondisplaced right inferior pubic ramus fracture (series 4, image 395). There is no pubic diastasis. No SI joint widening. There is heterotopic ossification along the anterior aspect of the right hip. 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 There is bilateral SI joint degenerative change. There is lower lumbar spine degenerative changes with posterior disc bulging and bilateral facet arthropathy likely resulting in mild-to-moderate spinal canal and neural foraminal narrowing. Ligaments Suboptimally assessed by CT. Muscles and Tendons No significant muscle atrophy. Soft tissues There is soft tissue swelling along the right hip. Abdominopelvic structures Unchanged nonobstructive right renal calculi. 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 Please see recommendations on prior CT abdomen pelvis on February 09, 2020. There is intraluminal gas within the bladder, presumably from recent catheterization. IMPRESSION: Partially visualized periprosthetic fracture along the right femoral stem, which extends through the lesser trochanter. See separately dictated right femur CT for further description. Nondisplaced right inferior pubic ramus fracture. Electronically Signed   By: Maurine Simmering M.D.   On: 09/12/2020 14:11   CT FEMUR RIGHT WO CONTRAST  Result Date: 09/12/2020 CLINICAL DATA:  Fall, trauma EXAM: CT OF THE LOWER RIGHT EXTREMITY WITHOUT CONTRAST TECHNIQUE: Multidetector CT imaging of the right lower extremity was performed according to the standard protocol. COMPARISON:  Radiograph 07/14/2020 FINDINGS: Bones/Joint/Cartilage There is a right  hip arthroplasty in normal alignment with femoral head component is seated within the acetabular cup. There is a periprosthetic fracture which courses through the lesser trochanter through the subtrochanteric femur posteriorly, and with a severely displaced obliquely oriented spiral fracture distal to the femoral stem, displaced proximally 2.4 cm laterally and with foreshortening. Ligaments Suboptimally assessed by CT. Muscles and Tendons No atrophy. Soft tissues Extensive soft tissue swelling.  Vascular calcifications. IMPRESSION: Periprosthetic fracture along the right femoral stem, with nondisplaced component proximally extending through the lesser trochanter, and a severely displaced component distal to the femoral stem with foreshortening. Electronically Signed   By: Maurine Simmering M.D.   On: 09/12/2020 17:07   DG CHEST PORT 1 VIEW  Result Date: 09/12/2020 CLINICAL DATA:  Fall. EXAM: PORTABLE CHEST 1 VIEW COMPARISON:  Chest radiograph dated 07/12/2020. FINDINGS: No focal consolidation, pleural effusion, pneumothorax. There is mild prominence of the pulmonary arteries bilaterally suggestive of pulmonary hypertension. Clinical  correlation is recommended. The cardiac silhouette is limits. Atherosclerotic calcification of the aorta. No acute osseous pathology. IMPRESSION: No acute cardiopulmonary process. Electronically Signed   By: Anner Crete M.D.   On: 09/12/2020 19:52   ECHOCARDIOGRAM COMPLETE  Result Date: 09/13/2020    ECHOCARDIOGRAM REPORT   Patient Name:   Kyle Farley Date of Exam: 09/13/2020 Medical Rec #:  [id number]       Height:       69.0 in Accession #:    [id number]      Weight:       155.0 lb Date of Birth:  10/03/1940        BSA:          1.854 m Patient Age:    80 years        BP:           100/60 mmHg Patient Gender: M               HR:           58 bpm. Exam Location:  Inpatient Procedure: 2D Echo Indications:    congestive heart failure  History:        Patient has no prior history  of Echocardiogram examinations.                 COPD, Arrythmias:Atrial Fibrillation; Risk Factors:Hypertension                 and Dyslipidemia.  Sonographer:    Johny Chess RDCS Referring Phys: [id number] CARINA M BROWN IMPRESSIONS  1. Left ventricular ejection fraction, by estimation, is 40 to 45%. The left ventricle has mildly decreased function. The left ventricle demonstrates global hypokinesis. Left ventricular diastolic parameters are indeterminate.  2. Right ventricular systolic function is normal. The right ventricular size is normal. There is normal pulmonary artery systolic pressure.  3. Left atrial size was moderately dilated.  4. The mitral valve is grossly normal. Mild mitral valve regurgitation.  5. The aortic valve is calcified. Aortic valve regurgitation is not visualized. Aortic valve sclerosis is present, with no evidence of aortic valve stenosis.  6. The inferior vena cava is dilated in size with >50% respiratory variability, suggesting right atrial pressure of 8 mmHg. Comparison(s): No prior Echocardiogram. FINDINGS  Left Ventricle: Left ventricular ejection fraction, by estimation, is 40 to 45%. The left ventricle has mildly decreased function. The left ventricle demonstrates global hypokinesis. The left ventricular internal cavity size was normal in size. There is  no left ventricular hypertrophy. Left ventricular diastolic parameters are indeterminate. Right Ventricle: The right ventricular size is normal. Right vetricular wall thickness was not well visualized. Right ventricular systolic function is normal. There is normal pulmonary artery systolic pressure. The tricuspid regurgitant velocity is 2.61 m/s, and with an assumed right atrial pressure of 3 mmHg, the estimated right ventricular systolic pressure is 123XX123 mmHg. Left Atrium: Left atrial size was moderately dilated. Right Atrium: Right atrial size was normal in size. Pericardium: There is no evidence of pericardial effusion.  Mitral Valve: The mitral valve is grossly normal. Mild mitral annular calcification. Mild mitral valve regurgitation. Tricuspid Valve: The tricuspid valve is normal in structure. Tricuspid valve regurgitation is trivial. Aortic Valve: The aortic valve is calcified. Aortic valve regurgitation is not visualized. Mild aortic valve sclerosis is present, with no evidence of aortic valve stenosis. Aortic valve mean gradient measures 7.8 mmHg. Aortic valve peak gradient measures 14.4 mmHg. Aortic valve area, by VTI measures 1.68 cm.  Pulmonic Valve: The pulmonic valve was not well visualized. Pulmonic valve regurgitation is not visualized. No evidence of pulmonic stenosis. Aorta: The aortic root and ascending aorta are structurally normal, with no evidence of dilitation. Venous: The inferior vena cava is dilated in size with greater than 50% respiratory variability, suggesting right atrial pressure of 8 mmHg. IAS/Shunts: The atrial septum is grossly normal.  LEFT VENTRICLE PLAX 2D LVIDd:         5.50 cm LVIDs:         4.50 cm LV PW:         0.90 cm LV IVS:        0.70 cm LVOT diam:     2.10 cm LV SV:         69 LV SV Index:   37 LVOT Area:     3.46 cm  IVC IVC diam: 2.20 cm LEFT ATRIUM             Index       RIGHT ATRIUM           Index LA diam:        4.10 cm 2.21 cm/m  RA Area:     18.30 cm LA Vol (A2C):   86.5 ml 46.66 ml/m RA Volume:   47.00 ml  25.35 ml/m LA Vol (A4C):   76.1 ml 41.05 ml/m LA Biplane Vol: 84.3 ml 45.47 ml/m  AORTIC VALVE AV Area (Vmax):    1.78 cm AV Area (Vmean):   1.75 cm AV Area (VTI):     1.68 cm AV Vmax:           189.75 cm/s AV Vmean:          124.275 cm/s AV VTI:            0.409 m AV Peak Grad:      14.4 mmHg AV Mean Grad:      7.8 mmHg LVOT Vmax:         97.78 cm/s LVOT Vmean:        62.750 cm/s LVOT VTI:          0.199 m LVOT/AV VTI ratio: 0.49  AORTA Ao Root diam: 3.30 cm Ao Asc diam:  3.50 cm TRICUSPID VALVE TR Peak grad:   27.2 mmHg TR Vmax:        261.00 cm/s  SHUNTS Systemic  VTI:  0.20 m Systemic Diam: 2.10 cm Rudean Haskell MD Electronically signed by Rudean Haskell MD Signature Date/Time: 09/13/2020/2:47:21 PM    Final    DG FEMUR PORT, MIN 2 VIEWS RIGHT  Result Date: 09/12/2020 CLINICAL DATA:  Patient fell.  Pain in right leg. EXAM: RIGHT FEMUR PORTABLE 2 VIEW COMPARISON:  Same day CT of the femur FINDINGS: Moderately displaced, oblique impacted fracture of the mid femoral shaft, with the fracture line approximately 1 cm distal to the distal of the right hip prosthesis femoral component. The hip prosthesis is intact and in appropriate position within the acetabulum. The distal fracture fragment is displaced laterally approximately 3 cm with approximately 4.5 cm of fracture fragment overlap. The distal femur and knee are rotated laterally. Single surgical skin staple is noted at the level of the right hip joint. IMPRESSION: 1. Moderately displaced, oblique impacted fracture of the mid femoral shaft, approximately 1 cm distal to the tip of the right hip prosthesis femoral component. Please see today's dedicated CT femur report for further details. 2. Right hip prosthesis is intact and in appropriate position. Electronically  Signed   By: Ileana Roup M.D.   On: 09/12/2020 16:33    Cardiac Studies   2D echo 09/13/2020 IMPRESSIONS    1. Left ventricular ejection fraction, by estimation, is 40 to 45%. The  left ventricle has mildly decreased function. The left ventricle  demonstrates global hypokinesis. Left ventricular diastolic parameters are  indeterminate.   2. Right ventricular systolic function is normal. The right ventricular  size is normal. There is normal pulmonary artery systolic pressure.   3. Left atrial size was moderately dilated.   4. The mitral valve is grossly normal. Mild mitral valve regurgitation.   5. The aortic valve is calcified. Aortic valve regurgitation is not  visualized. Aortic valve sclerosis is present, with no evidence of aortic   valve stenosis.   6. The inferior vena cava is dilated in size with >50% respiratory  variability, suggesting right atrial pressure of 8 mmHg.   Comparison(s): No prior Echocardiogram.   Patient Profile     80 y.o. male with a hx of paroxysmal atrial fibrillation, hypertension, hyperlipidemia, COPD, AAA and smoker who is being seen 09/13/2020 for the evaluation of preop clearance at the request of Dr. Owens Shark.  Assessment & Plan    Chest tightness -Reported daily history of chest tightness.  This get worse with ambulation.  Associated with severe shortness of breath.  Stress test normal in 2017. Denies prior history of cardiac catheterization or stenting. -Certainly, patient has multiple cardiac risk factors.  -2D echo showed mild LV dysfunction with EF 40-45% with global HK -given frequency of CP and abnormal LVF he needs ischemic workup>>could consider Lexiscan myoview but may be normal in setting of severe 3v CAD, therefore will proceed with [facility] in am to define coronary anatomy and rule out high risk anatomy prior to surgery for leg -Shared Decision Making/Informed Consent The risks [stroke (1 in 1000), death (1 in 1000), kidney failure [usually temporary] (1 in 500), bleeding (1 in 200), allergic reaction [possibly serious] (1 in 200)], benefits (diagnostic support and management of coronary artery disease) and alternatives of a cardiac catheterization were discussed in detail with Mr. Yearta and he is willing to proceed.  -start ASA '81mg'$  daily and Lopressor 12.'5mg'$  BID -check FLP in am -add Lipitor '40mg'$  daily for now (this is also on his home med sheet but not verified) -no IV Heparin gtt at present due to significant anemia   2.  Persistent atrial fibrillation -Patient was in sinus rhythm when last seen by Dr. Bettina Gavia 09/2019.  Since then EKG showing atrial fibrillation.  Per prior note in Care Everywhere in 2018 by Dr. Luisa Dago, previously on amiodarone which was discontinued due to chronic  lung disease.  However, currently patient on flecainide as well as amiodarone.  Patient tells me that he thinks his nurse caregiver told him to restart Amio because he was having CP -TSH is severely abnormal.   -Heart rate was in 50s to 60s but now in the 70-80's after stopping Cardizem, Amio -stopped Amio as well as Flecainide since he has been in afib for some time now>>this can be addressed outpt -Eliquis on hold for surgery -no IV heparin at present due to significant anemia   3.  Acute systolic heart failure -Echocardiogram showed LV function of 40 to 45% and grade 2 diastolic dysfunction.  Global hypokinesis. -Prior echo in 2017 with normal LV function.   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  Surgical clearance -Patient was dealing with daily chest pain prior to admission.   -Echocardiogram as above. -surgery on hold for his leg until coronary ischemic workup completed   5.  Anemia -Hemoglobin dropping to 7.6 yesterday but up to 8.5 today   -?related to bleeding from fall and fracture -Transfusion per primary team   6.  COPD -No active wheezing   7.  Alcohol abuse -Patient reports drinking 6 beers per day -Consider CIWA program   8.  Tobacco smoking -He smokes 1/2 pack to 2 packs/day -Recommended cessation  9.  Elevated TSH -? Related to Amio -FT4 low  -per [facility]    I have spent a total of 35 minutes with patient reviewing 2D echo , telemetry, EKGs, labs and examining patient as well as establishing  an assessment and plan that was discussed with the patient.  > 50% of time was spent in direct patient care.     For questions or updates, please contact [place] Please consult [email or web address] for contact info under        Signed, Fransico Him, MD  09/14/2020, 9:26 AM

## 2020-09-14 NOTE — Progress Notes (Signed)
Family Medicine Teaching Service Daily Progress Note Intern Pager: [phone]  Patient name: Kyle Farley Medical record number: [id number] Date of birth: [birth year] Age: 80 y.o. Gender: male  Primary Care Provider: Zoila Shutter, NP Consultants: Orthopedics, cardiology Code Status: Full  Pt Overview and Major Events to Date:  8/26: admitted for R inferior pubic ramus fx 8/27: transfused 1u pRBC, echo  Assessment and Plan: Kyle Farley is a 80 y/o M presenting with a femur fracture of the right lower extremity and a pelvic rami fracture.  PMHx significant for paroxysmal A. fib, COPD, HTN, GERD, alcohol abuse, hypothyroidism, BPH.  Right femur fracture  nondisplaced right inferior pubic ramus fracture Pain 7 out of 10 at this time without receiving IV morphine.  Surgery date changed to tomorrow pending left heart cath. -Orthopedics following, appreciate recommendations -Surgery held at this time due to cardiac evaluation -Continue Tylenol 650 mg every 4 hours and Oxy 5 milligrams every 4 hours as needed -IV morphine every 4 hours as needed for breakthrough pain -PT/OT eval after surgery  Chest pain  Paroxysmal A. Fib  HFpEF Patient is not actively endorsing chest pain but endorses it in the past with ambulation.  Echo noted LVEF 40-45% with global hypokinesis.  He has not had prior cardiac catheterization or stenting.  Cardiology is opting to proceed with left heart catheterization to rule out high risk anatomy prior to surgery for his hip fracture. Previous EKG showed A. fib with right bundle branch block per cardiology.  Patient is not in A. fib at this time.  Patient does not appear volume overloaded. -Cardiology following, appreciate recommendations -Hold flecainide and amiodarone per cardiology recommendations -Hold Eliquis due to anemia and surgery -Start ASA 81 mg daily and Lipitor 40 mg daily, per cardiology -Start losartan 25 mg daily and Lopressor 12.5 mg twice daily,  per cardiology -Daily weights  Hypotension: Resolved Received fiber cc bolus for hypotension.  Most recent blood pressures 110s-140s/70s-80s.  Diltiazem and lisinopril held. -Losartan and Lopressor started by cardiology -Monitor blood pressures  Anemia Hemoglobin of 7.6 and transfused 1 unit.  Most recent hemoglobin 8.5.  No sign of bleeding noted.  Right hip does not appear to show signs of bleeding/ecchymosis.  Patient does not have any signs of anemia.  Denies lightheadedness or excess fatigue. -Monitor hemoglobin and signs of bleeding -Continue to hold Eliquis -Ferritin tomorrow a.m.  Hyponatremia Sodium 126 today.  Urine output appropriate at 0.9 cc/kg/h.  Osmolality 288 presenting isotonic hyponatremia.  Patient is urinating appropriately.  Fluids worsened sodium status previously.  Hyponatremia suspect to be due to chronic pain releasing ADH. -Strict I's and O's -A.m. BMP  COPD, BPH, GERD, hypothyroidism -Continue management as previously adjusted  FEN/GI: Heart healthy PPx: SCDs, hold Eliquis Dispo:Home with home health  pending clinical improvement . Barriers include surgery.   Subjective:  Patient states he is still in pain that worsens with movement.  He denies any chest pain or difficulty breathing that is unchanged from his regular status affected by COPD.  Patient did not have any new concerns or questions at this time.  Objective: Temp:  [97.8 F (36.6 C)-99.5 F (37.5 C)] 97.8 F (36.6 C) (08/28 0722) Pulse Rate:  [52-93] 93 (08/28 0722) Resp:  [15-22] 15 (08/28 0722) BP: (86-148)/(55-83) 129/83 (08/28 0722) SpO2:  [94 %-100 %] 94 % (08/28 0722) Physical Exam: General: Awake, alert, appropriate in conversation, no acute distress Cardiovascular: RRR, no murmurs auscultated Respiratory: CTA B, no increased work of  breathing, nasal cannula 3 L Abdomen: Soft, nontender, normoactive bowel sounds Extremities: No erythema or ecchymosis of right hip, 2+ tibialis  pulses, cap refill less than 2 seconds  Laboratory: Recent Labs  Lab 09/13/20 0018 09/13/20 1514 09/14/20 0350  WBC 8.9 8.0 8.2  HGB 8.5* 7.6* 8.5*  HCT 26.5* 24.1* 25.7*  PLT 198 181 157   Recent Labs  Lab 09/13/20 0018 09/13/20 1514 09/14/20 0350  NA 126* 125* 126*  K 3.7 4.0 4.0  CL 94* 93* 94*  CO2 '27 26 25  '$ BUN 7* 11 10  CREATININE 0.86 0.98 0.92  CALCIUM 7.9* 8.0* 8.3*  PROT 4.8* 4.7*  --   BILITOT 0.6 0.7  --   ALKPHOS 105 93  --   ALT 11 11  --   AST 16 15  --   GLUCOSE 133* 97 104*   Imaging/Diagnostic Tests: Echo 09/13/20 1. Left ventricular ejection fraction, by estimation, is 40 to 45%. The  left ventricle has mildly decreased function. The left ventricle  demonstrates global hypokinesis. Left ventricular diastolic parameters are  indeterminate.   2. Right ventricular systolic function is normal. The right ventricular  size is normal. There is normal pulmonary artery systolic pressure.   3. Left atrial size was moderately dilated.   4. The mitral valve is grossly normal. Mild mitral valve regurgitation.   5. The aortic valve is calcified. Aortic valve regurgitation is not  visualized. Aortic valve sclerosis is present, with no evidence of aortic  valve stenosis.   6. The inferior vena cava is dilated in size with >50% respiratory  variability, suggesting right atrial pressure of 8 mmHg.  Kyle Guiles, DO 09/14/2020, 8:13 AM PGY-1, [facility] Intern pager: [phone], text pages welcome

## 2020-09-14 NOTE — Progress Notes (Signed)
Patient doing okay this morning, no acute distress.  Pain is manageable.  Surgery on hold for right leg until coronary ischemic workup completed.  We will plan for procedure tomorrow 09/15/2020, scheduled for 12 PM.  Patient aware.  We will continue to hold Eliquis.  Okay for diet today.  N.p.o. past midnight.Kyle Farley [facility] [phone] (office) [email or web address]

## 2020-09-14 NOTE — Progress Notes (Signed)
Family Medicine Teaching Service Daily Progress Note Intern Pager: [phone]  Patient name: Kyle Farley Medical record number: [id number] Date of birth: [birth year] Age: 80 y.o. Gender: male  Primary Care Provider: Zoila Shutter, NP Consultants: Orthopedics, cardiology Code Status: Full  Pt Overview and Major Events to Date:  8/26: admitted for R inferior pubic ramus and femur fx 8/27: transfused 1u pRBC, echo, A. fib 8/28: hypotensive, stable 8:29: LHC, Right hip surgery  Assessment and Plan: SIG CLISHAM is a 80 y/o M presenting with a right femur and pelvic rami fracture. PMHx significant for A. fib, COPD, HTN, GERD, alcohol abuse, hypothyroidism, BPH.  Right femur fracture  nondisplaced right inferior pubic ramus fracture Patient received surgery for hip today. Rounded on later in the afternoon and patient was still disoriented. Required 2u pRBC as he lost 1L blood in surgery. Vitals stable at this time.  -Orthopedics following, appreciate recommendations -Surgery today, will follow ortho recs -Tylenol 1g IV at 462m/hr once PRN, per ortho -Fentanyl 25-560m IV q5m52mPRN for pain >4, per ortho -Morphine '5mg'$  IV q4h PRN for mod-sev pain, per ortho -Oxycodone IR 2.5-'5mg'$  q4h PRN, per ortho -PT/OT eval after surgery -Continue to hold Eliquis, start subQ heparin tomorrow -H&H tonight 2200 -monitor vitals  Chest pain  A. Fib  HFpEF Patient is not actively endorsing chest pain but endorses that in the past with ambulation.  Echo noted LVEF 40-45% with global hypokinesis.  Left heart catheterization today to rule out high risk anatomy prior to surgery for his fracture. -Cardiology following, appreciate recommendations -Continue ASA 81 mg daily and Lipitor 40 mg daily, per cardiology -Daily weights  Hypotension Patient has had asymptomatic soft pressures 80s-90s/50s-70s since restarting losartan and metoprolol yesterday. -Hold losartan 25 mg daily and reduce Lopressor to  12.5 mg daily instead of twice daily -Monitor blood pressures  Anemia likely 2/2  Patient has received 2u for blood loss/anemia prior to surgery.  He lost 1L in surgery and required 2 more units for repletion. Ferritin was normal at 32 (low end of normal). - Post surgery Hgb check -Monitor hemoglobin and signs of bleeding -Continue to hold Eliquis  Hyponatremia Sodium 127 today.  Urine output at 0.7 cc/kg/hr.  -Strict I's and O's -Monitor BMP  Constipation Not had bowel movement since admission. -Start miralax BID and senna BID tomorrow AM  COPD, BPH, GERD, hypothyroidism -Continue management as previously adjusted   FEN/GI: Heart healthy PPx: Hold eliquis Dispo:SNF in 2-3 days. Barriers include surgery recovery.   Subjective:  Pt states his hip hurt but denied pain elsewhere. He thought he was at home  Objective: Temp:  [97.7 F (36.5 C)-98.9 F (37.2 C)] 98 F (36.7 C) (08/29 1617) Pulse Rate:  [69-96] 86 (08/29 1617) Resp:  [14-25] 22 (08/29 1617) BP: (92-157)/(40-90) 110/77 (08/29 1617) SpO2:  [82 %-100 %] 100 % (08/29 1617) Weight:  [74.8 kg] 74.8 kg (08/29 1025) Physical Exam: General: awake, disoriented, capable in conversation, no acute distress Cardiovascular: RRR, no murmurs auscultated Respiratory: CTAB, [place] in place without O2 requirement, no increased work of breathing Abdomen: normoactive bowel sounds Extremities: right radial  Laboratory: Recent Labs  Lab 09/14/20 0350 09/14/20 2007 09/15/20 0453  WBC 8.2 7.2 7.9  HGB 8.5* 7.5* 8.6*  HCT 25.7* 23.3* 25.5*  PLT 157 173 181   Recent Labs  Lab 09/13/20 0018 09/13/20 1514 09/14/20 0350 09/15/20 0453  NA 126* 125* 126* 127*  K 3.7 4.0 4.0 4.1  CL 94* 93* 94*  93*  CO2 '27 26 25 25  '$ BUN 7* '11 10 10  '$ CREATININE 0.86 0.98 0.92 0.91  CALCIUM 7.9* 8.0* 8.3* 8.4*  PROT 4.8* 4.7*  --   --   BILITOT 0.6 0.7  --   --   ALKPHOS 105 93  --   --   ALT 11 11  --   --   AST 16 15  --   --   GLUCOSE  133* 97 104* 92   Imaging/Diagnostic Tests: Left heart cath: 1.  Nonobstructive coronary artery disease with mild nonobstructive plaquing in the LAD, left circumflex, and moderate calcific stenosis of the RCA ostium. 2.  Low normal LVEDP  Wells Guiles, DO 09/15/2020, 4:40 PM PGY-1, [place] Intern pager: [phone], text pages welcome

## 2020-09-14 NOTE — Progress Notes (Signed)
Called Dr. Percell Miller regarding drop in hemoglobin by 1 unit in the last 24 hours.  He recommended that a severe bleed would have been more apparent with the last 48 hours after his acute fracture.  For now we can transfuse him 1 unit of blood and orthopedic team can consider CT angio tomorrow.  If patient becomes hemodynamically unstable overnight we can order CT angio overnight and call vascular/IR.  Appreciate orthopedics input.  Lattie Haw MD PGY 3, [place]

## 2020-09-14 NOTE — Progress Notes (Signed)
FPTS Interim Progress Note  S: Patient feeling well eating dinner and able to reposition himself as needed.  He states he is still in significant pain and declined his pain medication earlier but would like it now.  Denies any chest pain or worsened shortness of breath.  O: BP (!) 92/45 (BP Location: Left Arm)   Pulse 77   Temp 98.1 F (36.7 C) (Oral)   Resp 17   Ht '5\' 9"'$  (1.753 m)   Wt 70.3 kg   SpO2 98%   BMI 22.89 kg/m   General: Awake, alert, capable in conversation, no acute distress Cardiovascular: Irregular rhythm, no murmurs appreciated, 2+ radial pulses, cap refill less than 2 seconds Lungs: Wheezing bilaterally, no increased work of breathing   A/P: Patient is stable at this time.  His blood pressures remain soft.  Although he has pain, opioids may further decrease blood pressure.  Plan to monitor blood pressures and hold medications as necessary.  Holding pain medications at this time has been communicated with nursing.  I am in favor of holding metoprolol and exchanged with providing pain relief later tonight if able.  Wells Guiles, DO 09/14/2020, 7:23 PM PGY-1, [place] Medicine Service pager [phone]

## 2020-09-15 ENCOUNTER — Encounter (HOSPITAL_COMMUNITY)
Admission: EM | Disposition: A | Discharge status: Home Health | Payer: Self-pay | Source: Home / Self Care | Attending: Family Medicine

## 2020-09-15 ENCOUNTER — Inpatient Hospital Stay (HOSPITAL_COMMUNITY): Payer: Medicare Other | Admitting: Anesthesiology

## 2020-09-15 ENCOUNTER — Inpatient Hospital Stay (HOSPITAL_COMMUNITY): Payer: Medicare Other

## 2020-09-15 ENCOUNTER — Encounter (HOSPITAL_COMMUNITY): Payer: Self-pay | Admitting: Family Medicine

## 2020-09-15 DIAGNOSIS — I25119 Atherosclerotic heart disease of native coronary artery with unspecified angina pectoris: Secondary | ICD-10-CM

## 2020-09-15 DIAGNOSIS — S7291XA Unspecified fracture of right femur, initial encounter for closed fracture: Secondary | ICD-10-CM | POA: Diagnosis not present

## 2020-09-15 DIAGNOSIS — R079 Chest pain, unspecified: Secondary | ICD-10-CM | POA: Diagnosis not present

## 2020-09-15 DIAGNOSIS — J449 Chronic obstructive pulmonary disease, unspecified: Secondary | ICD-10-CM | POA: Diagnosis not present

## 2020-09-15 DIAGNOSIS — I480 Paroxysmal atrial fibrillation: Secondary | ICD-10-CM | POA: Diagnosis not present

## 2020-09-15 DIAGNOSIS — I5021 Acute systolic (congestive) heart failure: Secondary | ICD-10-CM | POA: Diagnosis not present

## 2020-09-15 HISTORY — PX: LEFT HEART CATH AND CORONARY ANGIOGRAPHY: CATH118249

## 2020-09-15 HISTORY — PX: ORIF FEMUR FRACTURE: SHX2119

## 2020-09-15 LAB — HEMOGLOBIN AND HEMATOCRIT, BLOOD
HCT: 31.8 % — ABNORMAL LOW (ref 39.0–52.0)
Hemoglobin: 10.2 g/dL — ABNORMAL LOW (ref 13.0–17.0)

## 2020-09-15 LAB — BASIC METABOLIC PANEL
Anion gap: 9 (ref 5–15)
BUN: 10 mg/dL (ref 8–23)
CO2: 25 mmol/L (ref 22–32)
Calcium: 8.4 mg/dL — ABNORMAL LOW (ref 8.9–10.3)
Chloride: 93 mmol/L — ABNORMAL LOW (ref 98–111)
Creatinine, Ser: 0.91 mg/dL (ref 0.61–1.24)
GFR, Estimated: >60 mL/min (ref >60)
Glucose, Bld: 92 mg/dL (ref 70–99)
Potassium: 4.1 mmol/L (ref 3.5–5.1)
Sodium: 127 mmol/L — ABNORMAL LOW (ref 135–145)

## 2020-09-15 LAB — PREPARE RBC (CROSSMATCH)

## 2020-09-15 LAB — CBC
HCT: 25.5 % — ABNORMAL LOW (ref 39.0–52.0)
Hemoglobin: 8.6 g/dL — ABNORMAL LOW (ref 13.0–17.0)
MCH: 29.7 pg (ref 26.0–34.0)
MCHC: 33.7 g/dL (ref 30.0–36.0)
MCV: 87.9 fL (ref 80.0–100.0)
Platelets: 181 10*3/uL (ref 150–400)
RBC: 2.9 MIL/uL — ABNORMAL LOW (ref 4.22–5.81)
RDW: 15.4 % (ref 11.5–15.5)
WBC: 7.9 10*3/uL (ref 4.0–10.5)
nRBC: 0 % (ref 0.0–0.2)

## 2020-09-15 LAB — VITAMIN D 25 HYDROXY (VIT D DEFICIENCY, FRACTURES): Vit D, 25-Hydroxy: 114.48 ng/mL — ABNORMAL HIGH (ref 30–100)

## 2020-09-15 LAB — FERRITIN: Ferritin: 32 ng/mL (ref 24–336)

## 2020-09-15 SURGERY — LEFT HEART CATH AND CORONARY ANGIOGRAPHY
Anesthesia: LOCAL

## 2020-09-15 SURGERY — OPEN REDUCTION INTERNAL FIXATION FEMORAL SHAFT FRACTURE
Anesthesia: General | Site: Leg Upper | Laterality: Right

## 2020-09-15 MED ORDER — SODIUM CHLORIDE 0.9 % IV SOLN
INTRAVENOUS | Status: DC
Start: 2020-09-16 — End: 2020-09-15

## 2020-09-15 MED ORDER — ONDANSETRON HCL 4 MG/2ML IJ SOLN
INTRAMUSCULAR | Status: DC | PRN
  Administered 2020-09-15: 4 mg via INTRAVENOUS

## 2020-09-15 MED ORDER — POTASSIUM CHLORIDE IN NACL 20-0.9 MEQ/L-% IV SOLN
INTRAVENOUS | Status: DC
  Filled 2020-09-15: qty 1000

## 2020-09-15 MED ORDER — VANCOMYCIN HCL 1000 MG IV SOLR
INTRAVENOUS | Status: AC
  Filled 2020-09-15: qty 20

## 2020-09-15 MED ORDER — ALBUTEROL SULFATE HFA 108 (90 BASE) MCG/ACT IN AERS
INHALATION_SPRAY | RESPIRATORY_TRACT | Status: DC | PRN
  Administered 2020-09-15 (×2): 2 via RESPIRATORY_TRACT

## 2020-09-15 MED ORDER — SODIUM CHLORIDE 0.9 % IV SOLN: 10.0000 mL/h | Freq: Once | INTRAVENOUS | Status: DC

## 2020-09-15 MED ORDER — ACETAMINOPHEN 325 MG PO TABS: 325.0000 mg | ORAL_TABLET | Freq: Four times a day (QID) | ORAL | Status: DC | PRN

## 2020-09-15 MED ORDER — IOHEXOL 350 MG/ML SOLN
INTRAVENOUS | Status: DC | PRN
  Administered 2020-09-15: 30 mL

## 2020-09-15 MED ORDER — SODIUM CHLORIDE 0.9 % WEIGHT BASED INFUSION: 1.0000 mL/kg/h | INTRAVENOUS | Status: AC

## 2020-09-15 MED ORDER — HYDRALAZINE HCL 20 MG/ML IJ SOLN: 10.0000 mg | INTRAMUSCULAR | Status: DC | PRN

## 2020-09-15 MED ORDER — SODIUM CHLORIDE 0.9 % IV SOLN: INTRAVENOUS | Status: DC

## 2020-09-15 MED ORDER — POLYETHYLENE GLYCOL 3350 17 G PO PACK: 17.0000 g | PACK | Freq: Two times a day (BID) | ORAL | Status: DC

## 2020-09-15 MED ORDER — HEPARIN SODIUM (PORCINE) 1000 UNIT/ML IJ SOLN
INTRAMUSCULAR | Status: DC | PRN
  Administered 2020-09-15: 4000 [IU] via INTRAVENOUS

## 2020-09-15 MED ORDER — PROPOFOL 10 MG/ML IV BOLUS
INTRAVENOUS | Status: DC | PRN
Start: 2020-09-15 — End: 2020-09-15
  Administered 2020-09-15: 100 mg via INTRAVENOUS

## 2020-09-15 MED ORDER — SODIUM CHLORIDE 0.9% FLUSH: 3.0000 mL | INTRAVENOUS | Status: DC | PRN

## 2020-09-15 MED ORDER — FENTANYL CITRATE (PF) 100 MCG/2ML IJ SOLN
INTRAMUSCULAR | Status: AC
  Filled 2020-09-15: qty 2

## 2020-09-15 MED ORDER — ORAL CARE MOUTH RINSE: 15.0000 mL | Freq: Once | OROMUCOSAL | Status: AC

## 2020-09-15 MED ORDER — LIDOCAINE HCL (PF) 1 % IJ SOLN
INTRAMUSCULAR | Status: DC | PRN
  Administered 2020-09-15: 2 mL

## 2020-09-15 MED ORDER — CEFAZOLIN SODIUM-DEXTROSE 2-4 GM/100ML-% IV SOLN
2.0000 g | Freq: Three times a day (TID) | INTRAVENOUS | Status: DC
  Administered 2020-09-15 – 2020-09-16 (×2): 2 g via INTRAVENOUS
  Filled 2020-09-15 (×3): qty 100

## 2020-09-15 MED ORDER — 0.9 % SODIUM CHLORIDE (POUR BTL) OPTIME
TOPICAL | Status: DC | PRN
  Administered 2020-09-15: 1000 mL

## 2020-09-15 MED ORDER — FENTANYL CITRATE (PF) 100 MCG/2ML IJ SOLN: 25.0000 ug | INTRAMUSCULAR | Status: DC | PRN

## 2020-09-15 MED ORDER — ONDANSETRON HCL 4 MG/2ML IJ SOLN: 4.0000 mg | Freq: Once | INTRAMUSCULAR | Status: DC | PRN

## 2020-09-15 MED ORDER — ALBUMIN HUMAN 5 % IV SOLN: INTRAVENOUS | Status: DC | PRN

## 2020-09-15 MED ORDER — ROCURONIUM BROMIDE 10 MG/ML (PF) SYRINGE
PREFILLED_SYRINGE | INTRAVENOUS | Status: AC
  Filled 2020-09-15: qty 30

## 2020-09-15 MED ORDER — PHENYLEPHRINE 40 MCG/ML (10ML) SYRINGE FOR IV PUSH (FOR BLOOD PRESSURE SUPPORT)
PREFILLED_SYRINGE | INTRAVENOUS | Status: DC | PRN
  Administered 2020-09-15: 80 ug via INTRAVENOUS
  Administered 2020-09-15 (×2): 120 ug via INTRAVENOUS
  Administered 2020-09-15: 80 ug via INTRAVENOUS

## 2020-09-15 MED ORDER — HEPARIN (PORCINE) IN NACL 1000-0.9 UT/500ML-% IV SOLN
INTRAVENOUS | Status: AC
  Filled 2020-09-15: qty 1000

## 2020-09-15 MED ORDER — SENNA 8.6 MG PO TABS
1.0000 | ORAL_TABLET | Freq: Two times a day (BID) | ORAL | Status: DC
  Administered 2020-09-16 (×2): 8.6 mg via ORAL
  Filled 2020-09-15 (×2): qty 1

## 2020-09-15 MED ORDER — AMISULPRIDE (ANTIEMETIC) 5 MG/2ML IV SOLN: 10.0000 mg | Freq: Once | INTRAVENOUS | Status: DC | PRN

## 2020-09-15 MED ORDER — FENTANYL CITRATE (PF) 250 MCG/5ML IJ SOLN
INTRAMUSCULAR | Status: DC | PRN
  Administered 2020-09-15 (×3): 50 ug via INTRAVENOUS
  Administered 2020-09-15: 100 ug via INTRAVENOUS

## 2020-09-15 MED ORDER — MIDAZOLAM HCL 2 MG/2ML IJ SOLN
INTRAMUSCULAR | Status: DC | PRN
  Administered 2020-09-15: 1 mg via INTRAVENOUS

## 2020-09-15 MED ORDER — METOCLOPRAMIDE HCL 5 MG PO TABS
5.0000 mg | ORAL_TABLET | Freq: Three times a day (TID) | ORAL | Status: DC | PRN
  Filled 2020-09-15: qty 2

## 2020-09-15 MED ORDER — POLYETHYLENE GLYCOL 3350 17 G PO PACK: 17.0000 g | PACK | Freq: Every day | ORAL | Status: DC | PRN

## 2020-09-15 MED ORDER — DEXAMETHASONE SODIUM PHOSPHATE 10 MG/ML IJ SOLN
INTRAMUSCULAR | Status: DC | PRN
  Administered 2020-09-15: 10 mg via INTRAVENOUS

## 2020-09-15 MED ORDER — LIDOCAINE 2% (20 MG/ML) 5 ML SYRINGE
INTRAMUSCULAR | Status: DC | PRN
  Administered 2020-09-15: 60 mg via INTRAVENOUS

## 2020-09-15 MED ORDER — SODIUM CHLORIDE 0.9% FLUSH: 3.0000 mL | Freq: Two times a day (BID) | INTRAVENOUS | Status: DC

## 2020-09-15 MED ORDER — SODIUM CHLORIDE 0.9% FLUSH
3.0000 mL | INTRAVENOUS | Status: DC | PRN
Start: 2020-09-16 — End: 2020-09-15

## 2020-09-15 MED ORDER — PHENYLEPHRINE 40 MCG/ML (10ML) SYRINGE FOR IV PUSH (FOR BLOOD PRESSURE SUPPORT)
PREFILLED_SYRINGE | INTRAVENOUS | Status: AC
  Filled 2020-09-15: qty 30

## 2020-09-15 MED ORDER — ONDANSETRON HCL 4 MG/2ML IJ SOLN: 4.0000 mg | Freq: Four times a day (QID) | INTRAMUSCULAR | Status: DC | PRN

## 2020-09-15 MED ORDER — ACETAMINOPHEN 325 MG PO TABS: 650.0000 mg | ORAL_TABLET | ORAL | Status: DC | PRN

## 2020-09-15 MED ORDER — HEPARIN SODIUM (PORCINE) 1000 UNIT/ML IJ SOLN
INTRAMUSCULAR | Status: AC
  Filled 2020-09-15: qty 1

## 2020-09-15 MED ORDER — FENTANYL CITRATE (PF) 100 MCG/2ML IJ SOLN
INTRAMUSCULAR | Status: DC | PRN
  Administered 2020-09-15: 25 ug via INTRAVENOUS

## 2020-09-15 MED ORDER — EPHEDRINE 5 MG/ML INJ
INTRAVENOUS | Status: AC
  Filled 2020-09-15: qty 5

## 2020-09-15 MED ORDER — LACTATED RINGERS IV SOLN: INTRAVENOUS | Status: DC

## 2020-09-15 MED ORDER — LABETALOL HCL 5 MG/ML IV SOLN: 10.0000 mg | INTRAVENOUS | Status: DC | PRN

## 2020-09-15 MED ORDER — VERAPAMIL HCL 2.5 MG/ML IV SOLN
INTRAVENOUS | Status: AC
  Filled 2020-09-15: qty 2

## 2020-09-15 MED ORDER — METOCLOPRAMIDE HCL 5 MG/ML IJ SOLN: 5.0000 mg | Freq: Three times a day (TID) | INTRAMUSCULAR | Status: DC | PRN

## 2020-09-15 MED ORDER — ROCURONIUM BROMIDE 10 MG/ML (PF) SYRINGE
PREFILLED_SYRINGE | INTRAVENOUS | Status: DC | PRN
  Administered 2020-09-15: 40 mg via INTRAVENOUS
  Administered 2020-09-15: 60 mg via INTRAVENOUS

## 2020-09-15 MED ORDER — TRANEXAMIC ACID-NACL 1000-0.7 MG/100ML-% IV SOLN
INTRAVENOUS | Status: AC
  Filled 2020-09-15: qty 100

## 2020-09-15 MED ORDER — ACETAMINOPHEN 10 MG/ML IV SOLN: 1000.0000 mg | Freq: Once | INTRAVENOUS | Status: DC | PRN

## 2020-09-15 MED ORDER — CHLORHEXIDINE GLUCONATE 0.12 % MT SOLN
15.0000 mL | Freq: Once | OROMUCOSAL | Status: AC
  Administered 2020-09-15: 15 mL via OROMUCOSAL
  Filled 2020-09-15: qty 15

## 2020-09-15 MED ORDER — MORPHINE SULFATE (PF) 4 MG/ML IV SOLN: 5.0000 mg | INTRAVENOUS | Status: DC | PRN

## 2020-09-15 MED ORDER — SODIUM CHLORIDE 0.9 % IV SOLN
250.0000 mL | INTRAVENOUS | Status: DC | PRN
Start: 2020-09-16 — End: 2020-09-15

## 2020-09-15 MED ORDER — SODIUM CHLORIDE 0.9 % IV SOLN
250.0000 mL | INTRAVENOUS | Status: DC | PRN
Start: 2020-09-15 — End: 2020-09-15

## 2020-09-15 MED ORDER — PHENYLEPHRINE HCL-NACL 20-0.9 MG/250ML-% IV SOLN
INTRAVENOUS | Status: DC | PRN
  Administered 2020-09-15: 100 ug/min via INTRAVENOUS

## 2020-09-15 MED ORDER — LIDOCAINE HCL (PF) 1 % IJ SOLN
INTRAMUSCULAR | Status: AC
  Filled 2020-09-15: qty 30

## 2020-09-15 MED ORDER — CEFAZOLIN SODIUM-DEXTROSE 2-4 GM/100ML-% IV SOLN
INTRAVENOUS | Status: AC
  Filled 2020-09-15: qty 100

## 2020-09-15 MED ORDER — ONDANSETRON HCL 4 MG PO TABS: 4.0000 mg | ORAL_TABLET | Freq: Four times a day (QID) | ORAL | Status: DC | PRN

## 2020-09-15 MED ORDER — TRANEXAMIC ACID-NACL 1000-0.7 MG/100ML-% IV SOLN
1000.0000 mg | Freq: Once | INTRAVENOUS | Status: DC
  Filled 2020-09-15: qty 100

## 2020-09-15 MED ORDER — TRANEXAMIC ACID-NACL 1000-0.7 MG/100ML-% IV SOLN
INTRAVENOUS | Status: DC | PRN
  Administered 2020-09-15: 1000 mg via INTRAVENOUS

## 2020-09-15 MED ORDER — DOCUSATE SODIUM 100 MG PO CAPS
100.0000 mg | ORAL_CAPSULE | Freq: Two times a day (BID) | ORAL | Status: DC
  Administered 2020-09-15 – 2020-09-16 (×2): 100 mg via ORAL
  Filled 2020-09-15 (×2): qty 1

## 2020-09-15 MED ORDER — FENTANYL CITRATE (PF) 250 MCG/5ML IJ SOLN
INTRAMUSCULAR | Status: AC
  Filled 2020-09-15: qty 5

## 2020-09-15 MED ORDER — LIDOCAINE 2% (20 MG/ML) 5 ML SYRINGE
INTRAMUSCULAR | Status: AC
  Filled 2020-09-15: qty 15

## 2020-09-15 MED ORDER — HEPARIN (PORCINE) IN NACL 1000-0.9 UT/500ML-% IV SOLN
INTRAVENOUS | Status: DC | PRN
  Administered 2020-09-15 (×2): 500 mL

## 2020-09-15 MED ORDER — VERAPAMIL HCL 2.5 MG/ML IV SOLN
INTRAVENOUS | Status: DC | PRN
  Administered 2020-09-15: 10 mL via INTRA_ARTERIAL

## 2020-09-15 MED ORDER — HYDROCODONE-ACETAMINOPHEN 5-325 MG PO TABS: 1.0000 | ORAL_TABLET | ORAL | Status: DC | PRN

## 2020-09-15 MED ORDER — VANCOMYCIN HCL 1000 MG IV SOLR
INTRAVENOUS | Status: DC | PRN
  Administered 2020-09-15: 1000 mg

## 2020-09-15 MED ORDER — OXYCODONE HCL 5 MG PO TABS
2.5000 mg | ORAL_TABLET | ORAL | Status: DC | PRN
  Administered 2020-09-15 – 2020-09-16 (×4): 5 mg via ORAL
  Filled 2020-09-15 (×4): qty 1

## 2020-09-15 MED ORDER — MIDAZOLAM HCL 2 MG/2ML IJ SOLN
INTRAMUSCULAR | Status: AC
  Filled 2020-09-15: qty 2

## 2020-09-15 MED ORDER — MORPHINE SULFATE (PF) 2 MG/ML IV SOLN: 0.5000 mg | INTRAVENOUS | Status: DC | PRN

## 2020-09-15 SURGICAL SUPPLY — 71 items
APL PRP STRL LF DISP 70% ISPRP (MISCELLANEOUS) ×2
APPLIER CLIP 9.375 MED OPEN (MISCELLANEOUS) ×2
APR CLP MED 9.3 20 MLT OPN (MISCELLANEOUS) ×1
BAG COUNTER SPONGE SURGICOUNT (BAG) ×2 IMPLANT
BAG SPNG CNTER NS LX DISP (BAG) ×1
BIT DRILL 4.3 (BIT) ×2
BIT DRILL 4.3X300MM (BIT) IMPLANT
BIT DRILL LONG 3.3 (BIT) ×1 IMPLANT
BIT DRILL QC 3.3X195 (BIT) ×1 IMPLANT
BNDG CMPR MED 10X6 ELC LF (GAUZE/BANDAGES/DRESSINGS) ×1
BNDG ELASTIC 6X10 VLCR STRL LF (GAUZE/BANDAGES/DRESSINGS) ×1 IMPLANT
BRUSH SCRUB EZ PLAIN DRY (MISCELLANEOUS) ×4 IMPLANT
CABLE CERLAGE W/CRIMP 1.8 (Cable) ×3 IMPLANT
CAP LOCK NCB (Cap) ×8 IMPLANT
CHLORAPREP W/TINT 26 (MISCELLANEOUS) ×4 IMPLANT
CLIP APPLIE 9.375 MED OPEN (MISCELLANEOUS) IMPLANT
COVER SURGICAL LIGHT HANDLE (MISCELLANEOUS) ×4 IMPLANT
DRAPE C-ARM 42X72 X-RAY (DRAPES) ×2 IMPLANT
DRAPE C-ARMOR (DRAPES) ×2 IMPLANT
DRAPE IMP U-DRAPE 54X76 (DRAPES) ×2 IMPLANT
DRAPE U-SHAPE 47X51 STRL (DRAPES) ×2 IMPLANT
DRSG ADAPTIC 3X8 NADH LF (GAUZE/BANDAGES/DRESSINGS) ×1 IMPLANT
DRSG AQUACEL AG ADV 3.5X14 (GAUZE/BANDAGES/DRESSINGS) ×1 IMPLANT
DRSG PAD ABDOMINAL 8X10 ST (GAUZE/BANDAGES/DRESSINGS) ×4 IMPLANT
ELECT REM PT RETURN 9FT ADLT (ELECTROSURGICAL) ×2
ELECTRODE REM PT RTRN 9FT ADLT (ELECTROSURGICAL) ×1 IMPLANT
EVACUATOR 1/8 PVC DRAIN (DRAIN) IMPLANT
GLOVE SURG ENC MOIS LTX SZ6.5 (GLOVE) ×6 IMPLANT
GLOVE SURG ENC MOIS LTX SZ7.5 (GLOVE) ×8 IMPLANT
GLOVE SURG UNDER POLY LF SZ6.5 (GLOVE) ×2 IMPLANT
GLOVE SURG UNDER POLY LF SZ7.5 (GLOVE) ×2 IMPLANT
GOWN STRL REUS W/ TWL LRG LVL3 (GOWN DISPOSABLE) ×2 IMPLANT
GOWN STRL REUS W/TWL LRG LVL3 (GOWN DISPOSABLE) ×4
K-WIRE 2.0 (WIRE) ×2
K-WIRE FXSTD 280X2XNS SS (WIRE) ×1
KIT BASIN OR (CUSTOM PROCEDURE TRAY) ×2 IMPLANT
KIT TURNOVER KIT B (KITS) ×2 IMPLANT
KWIRE FXSTD 280X2XNS SS (WIRE) IMPLANT
MANIFOLD NEPTUNE II (INSTRUMENTS) ×2 IMPLANT
NS IRRIG 1000ML POUR BTL (IV SOLUTION) ×2 IMPLANT
PACK TOTAL JOINT (CUSTOM PROCEDURE TRAY) ×2 IMPLANT
PAD ARMBOARD 7.5X6 YLW CONV (MISCELLANEOUS) ×4 IMPLANT
PAD CAST 4YDX4 CTTN HI CHSV (CAST SUPPLIES) IMPLANT
PADDING CAST COTTON 4X4 STRL (CAST SUPPLIES) ×2
PADDING CAST COTTON 6X4 STRL (CAST SUPPLIES) ×1 IMPLANT
PLATE PROXIMAL FEMUR 12H RT (Plate) ×1 IMPLANT
SCREW CORT NCB SELFTAP 5.0X42 (Screw) ×1 IMPLANT
SCREW CORTICAL NCB 5.0X40 (Screw) ×1 IMPLANT
SCREW HUM NCB PA ST 4X60 (Screw) ×1 IMPLANT
SCREW NCB 4.0MX34M (Screw) ×1 IMPLANT
SCREW NCB 4.0MX42M (Screw) ×1 IMPLANT
SCREW NCB 4.0MX44M (Screw) ×1 IMPLANT
SCREW NCB 4.0MX50M (Screw) ×1 IMPLANT
SCREW NCB 4.0MX55M (Screw) ×1 IMPLANT
SCREW NCB 4.0X36MM (Screw) ×1 IMPLANT
SPONGE T-LAP 18X18 ~~LOC~~+RFID (SPONGE) ×2 IMPLANT
STAPLER VISISTAT 35W (STAPLE) ×2 IMPLANT
SUT ETHILON 3 0 PS 1 (SUTURE) ×1 IMPLANT
SUT MNCRL AB 3-0 PS2 18 (SUTURE) ×1 IMPLANT
SUT MON AB 2-0 CT1 36 (SUTURE) ×2 IMPLANT
SUT PROLENE 0 CT (SUTURE) IMPLANT
SUT VIC AB 0 CT1 27 (SUTURE) ×4
SUT VIC AB 0 CT1 27XBRD ANBCTR (SUTURE) ×1 IMPLANT
SUT VIC AB 1 CT1 27 (SUTURE) ×6
SUT VIC AB 1 CT1 27XBRD ANTBC (SUTURE) ×4 IMPLANT
SUT VIC AB 2-0 CT1 27 (SUTURE) ×4
SUT VIC AB 2-0 CT1 TAPERPNT 27 (SUTURE) ×1 IMPLANT
TOWEL GREEN STERILE (TOWEL DISPOSABLE) ×4 IMPLANT
TOWEL GREEN STERILE FF (TOWEL DISPOSABLE) ×2 IMPLANT
TRAY FOLEY MTR SLVR 16FR STAT (SET/KITS/TRAYS/PACK) IMPLANT
WATER STERILE IRR 1000ML POUR (IV SOLUTION) ×4 IMPLANT

## 2020-09-15 SURGICAL SUPPLY — 10 items
CATH 5FR JL3.5 JR4 ANG PIG MP (CATHETERS) ×1 IMPLANT
DEVICE RAD COMP TR BAND LRG (VASCULAR PRODUCTS) ×1 IMPLANT
GLIDESHEATH SLEND SS 6F .021 (SHEATH) ×1 IMPLANT
GUIDEWIRE INQWIRE 1.5J.035X260 (WIRE) IMPLANT
INQWIRE 1.5J .035X260CM (WIRE) ×2
KIT HEART LEFT (KITS) ×2 IMPLANT
MAT PREVALON FULL STRYKER (MISCELLANEOUS) ×1 IMPLANT
PACK CARDIAC CATHETERIZATION (CUSTOM PROCEDURE TRAY) ×2 IMPLANT
TRANSDUCER W/STOPCOCK (MISCELLANEOUS) ×2 IMPLANT
TUBING CIL FLEX 10 FLL-RA (TUBING) ×2 IMPLANT

## 2020-09-15 NOTE — Progress Notes (Signed)
FPTS Brief Progress Note  S Saw patient at bedside this evening. Patient was resting comfortably. Endorses 6/10 severity over right hip. Pain relieved with oxycodone.  O: BP 119/86 (BP Location: Left Arm)   Pulse 86   Temp (!) 97.4 F (36.3 C) (Oral)   Resp 15   Ht '5\' 9"'$  (1.753 m)   Wt 74.8 kg   SpO2 100%   BMI 24.37 kg/m    General: Alert, no acute distress, pleasant  Cardio: well perfused  Pulm: normal work of breathing Extremities: RLE is ace wrap, special orthopedic dressing with collection of blood within it, dressings dry and clean. Neuro: Cranial nerves grossly intact   A/P: Plan per day team -Follow up H&H tonight  -Monitor post op pain   -For pain: tylenol '650mg'$  Q4PRN, oxycodone 2.5-'5mg'$  Q4PRN, morphine 0.5-'1mg'$  Q2PRN  -Orders reviewed. Labs for AM ordered, which was adjusted as needed.  - If condition changes, plan includes transfusion.  Lattie Haw, MD 09/15/2020, 8:11 PM PGY-3, [facility] Night Resident  Please page [phone] with questions.

## 2020-09-15 NOTE — Progress Notes (Signed)
TR BAND REMOVAL  LOCATION:    right radial  DEFLATED PER PROTOCOL:    Yes.    TIME BAND OFF / DRESSING APPLIED:    1010am A clean dry dressing applied with guaze and tegaderm   SITE UPON ARRIVAL:    Level 0  SITE AFTER BAND REMOVAL:    Level 0  CIRCULATION SENSATION AND MOVEMENT:    Within Normal Limits   Yes.    COMMENTS:   Care instruction  given to patient.

## 2020-09-15 NOTE — Progress Notes (Signed)
Cardiology Progress Note  Patient ID: Kyle Farley MRN: [id number] DOB: 09-05-1940 Date of Encounter: 09/15/2020  Primary Cardiologist: Shirlee More, MD  Subjective   Chief Complaint: Leg pain  HPI: Nonobstructive CAD on heart cath.  Plans for femur surgery today.  ROS:  All other ROS reviewed and negative. Pertinent positives noted in the HPI.     Inpatient Medications  Scheduled Meds:  [MAR Hold] acetaminophen  650 mg Oral Q4H   [MAR Hold] aspirin EC  81 mg Oral Daily   [MAR Hold] atorvastatin  40 mg Oral Daily   [MAR Hold] feeding supplement  237 mL Oral BID BM   [MAR Hold] fluticasone furoate-vilanterol  1 puff Inhalation Daily   [MAR Hold] levothyroxine  50 mcg Oral QAC breakfast   [MAR Hold] lidocaine  1 patch Transdermal Q24H   [MAR Hold] metoprolol tartrate  12.5 mg Oral Daily   [MAR Hold] multivitamin with minerals  1 tablet Oral Daily   [MAR Hold] pantoprazole  40 mg Oral Daily   [MAR Hold] sodium chloride flush  3 mL Intravenous Q12H   [MAR Hold] tamsulosin  0.4 mg Oral Daily   [MAR Hold] umeclidinium bromide  1 puff Inhalation Daily   Continuous Infusions:  sodium chloride     sodium chloride 999 mL/hr (09/15/20 0755)   sodium chloride 1 mL/kg/hr (09/15/20 0829)   PRN Meds: sodium chloride, [MAR Hold] albuterol, [MAR Hold]  morphine injection, ondansetron (ZOFRAN) IV, [MAR Hold] oxyCODONE, sodium chloride flush   Vital Signs   Vitals:   09/15/20 0940 09/15/20 0945 09/15/20 0950 09/15/20 1005  BP: 121/64 (!) 112/59 104/65 119/73  Pulse: 87 96 95 87  Resp: (!) '23 15 16 18  '$ Temp:      TempSrc:      SpO2: 95% 93% (!) 84% 95%  Weight:      Height:        Intake/Output Summary (Last 24 hours) at 09/15/2020 1016 Last data filed at 09/15/2020 0140 Gross per 24 hour  Intake 282 ml  Output 550 ml  Net -268 ml   Last 3 Weights 09/12/2020 10/10/2019 04/12/2019  Weight (lbs) 155 lb 156 lb 164 lb  Weight (kg) 70.308 kg 70.761 kg 74.39 kg     ECG  The most  recent ECG shows atrial fibrillation heart rate 57, right bundle branch block, which I personally reviewed.   Physical Exam   Vitals:   09/15/20 0940 09/15/20 0945 09/15/20 0950 09/15/20 1005  BP: 121/64 (!) 112/59 104/65 119/73  Pulse: 87 96 95 87  Resp: (!) '23 15 16 18  '$ Temp:      TempSrc:      SpO2: 95% 93% (!) 84% 95%  Weight:      Height:        Intake/Output Summary (Last 24 hours) at 09/15/2020 1016 Last data filed at 09/15/2020 0140 Gross per 24 hour  Intake 282 ml  Output 550 ml  Net -268 ml    Last 3 Weights 09/12/2020 10/10/2019 04/12/2019  Weight (lbs) 155 lb 156 lb 164 lb  Weight (kg) 70.308 kg 70.761 kg 74.39 kg    Body mass index is 22.89 kg/m.   General: Well nourished, well developed, in no acute distress Head: Atraumatic, normal size  Eyes: PEERLA, EOMI  Neck: Supple, no JVD Endocrine: No thryomegaly Cardiac: Normal S1, S2; irregular rhythm, no murmurs rubs or gallops Lungs: Diminished breath sounds bilaterally, rhonchi noted Abd: Soft, nontender, no hepatomegaly  Ext: No edema, pulses  2+, right radial cath site clean and dry, 2+ pulses Musculoskeletal: Right lower extremity in traction Skin: Warm and dry, no rashes   Neuro: Alert and oriented to person, place, time, and situation, CNII-XII grossly intact, no focal deficits  Psych: Normal mood and affect   Labs  High Sensitivity Troponin:  No results for input(s): TROPONINIHS in the last 720 hours.   Cardiac EnzymesNo results for input(s): TROPONINI in the last 168 hours. No results for input(s): TROPIPOC in the last 168 hours.  Chemistry Recent Labs  Lab 09/13/20 0018 09/13/20 1514 09/14/20 0350 09/15/20 0453  NA 126* 125* 126* 127*  K 3.7 4.0 4.0 4.1  CL 94* 93* 94* 93*  CO2 '27 26 25 25  '$ GLUCOSE 133* 97 104* 92  BUN 7* '11 10 10  '$ CREATININE 0.86 0.98 0.92 0.91  CALCIUM 7.9* 8.0* 8.3* 8.4*  PROT 4.8* 4.7*  --   --   ALBUMIN 2.2* 2.1*  --   --   AST 16 15  --   --   ALT 11 11  --   --    ALKPHOS 105 93  --   --   BILITOT 0.6 0.7  --   --   GFRNONAA >60 >60 >60 >60  ANIONGAP '5 6 7 9    '$ Hematology Recent Labs  Lab 09/14/20 0350 09/14/20 2007 09/15/20 0453  WBC 8.2 7.2 7.9  RBC 2.94* 2.63* 2.90*  HGB 8.5* 7.5* 8.6*  HCT 25.7* 23.3* 25.5*  MCV 87.4 88.6 87.9  MCH 28.9 28.5 29.7  MCHC 33.1 32.2 33.7  RDW 15.7* 15.6* 15.4  PLT 157 173 181   BNP Recent Labs  Lab 09/13/20 2237  BNP 230.4*    DDimer No results for input(s): DDIMER in the last 168 hours.   Radiology  CARDIAC CATHETERIZATION  Result Date: 09/15/2020   Ost RCA to Prox RCA lesion is 50% stenosed.   Prox LAD lesion is 30% stenosed with 0% stenosed side branch in 1st Diag.   Prox Cx to Mid Cx lesion is 40% stenosed. 1.  Nonobstructive coronary artery disease with mild nonobstructive plaquing in the LAD, left circumflex, and moderate calcific stenosis of the RCA ostium. 2.  Low normal LVEDP Recommend: No high-grade coronary stenoses identified.  Patient with femur fracture.  He should not be at excessive risk of ischemic cardiac complications.   ECHOCARDIOGRAM COMPLETE  Result Date: 09/13/2020    ECHOCARDIOGRAM REPORT   Patient Name:   Kyle Farley Date of Exam: 09/13/2020 Medical Rec #:  [id number]       Height:       69.0 in Accession #:    [id number]      Weight:       155.0 lb Date of Birth:  1940/05/24        BSA:          1.854 m Patient Age:    80 years        BP:           100/60 mmHg Patient Gender: M               HR:           58 bpm. Exam Location:  Inpatient Procedure: 2D Echo Indications:    congestive heart failure  History:        Patient has no prior history of Echocardiogram examinations.  COPD, Arrythmias:Atrial Fibrillation; Risk Factors:Hypertension                 and Dyslipidemia.  Sonographer:    Johny Chess RDCS Referring Phys: [id number] CARINA M BROWN IMPRESSIONS  1. Left ventricular ejection fraction, by estimation, is 40 to 45%. The left ventricle has mildly  decreased function. The left ventricle demonstrates global hypokinesis. Left ventricular diastolic parameters are indeterminate.  2. Right ventricular systolic function is normal. The right ventricular size is normal. There is normal pulmonary artery systolic pressure.  3. Left atrial size was moderately dilated.  4. The mitral valve is grossly normal. Mild mitral valve regurgitation.  5. The aortic valve is calcified. Aortic valve regurgitation is not visualized. Aortic valve sclerosis is present, with no evidence of aortic valve stenosis.  6. The inferior vena cava is dilated in size with >50% respiratory variability, suggesting right atrial pressure of 8 mmHg. Comparison(s): No prior Echocardiogram. FINDINGS  Left Ventricle: Left ventricular ejection fraction, by estimation, is 40 to 45%. The left ventricle has mildly decreased function. The left ventricle demonstrates global hypokinesis. The left ventricular internal cavity size was normal in size. There is  no left ventricular hypertrophy. Left ventricular diastolic parameters are indeterminate. Right Ventricle: The right ventricular size is normal. Right vetricular wall thickness was not well visualized. Right ventricular systolic function is normal. There is normal pulmonary artery systolic pressure. The tricuspid regurgitant velocity is 2.61 m/s, and with an assumed right atrial pressure of 3 mmHg, the estimated right ventricular systolic pressure is 123XX123 mmHg. Left Atrium: Left atrial size was moderately dilated. Right Atrium: Right atrial size was normal in size. Pericardium: There is no evidence of pericardial effusion. Mitral Valve: The mitral valve is grossly normal. Mild mitral annular calcification. Mild mitral valve regurgitation. Tricuspid Valve: The tricuspid valve is normal in structure. Tricuspid valve regurgitation is trivial. Aortic Valve: The aortic valve is calcified. Aortic valve regurgitation is not visualized. Mild aortic valve sclerosis  is present, with no evidence of aortic valve stenosis. Aortic valve mean gradient measures 7.8 mmHg. Aortic valve peak gradient measures 14.4 mmHg. Aortic valve area, by VTI measures 1.68 cm. Pulmonic Valve: The pulmonic valve was not well visualized. Pulmonic valve regurgitation is not visualized. No evidence of pulmonic stenosis. Aorta: The aortic root and ascending aorta are structurally normal, with no evidence of dilitation. Venous: The inferior vena cava is dilated in size with greater than 50% respiratory variability, suggesting right atrial pressure of 8 mmHg. IAS/Shunts: The atrial septum is grossly normal.  LEFT VENTRICLE PLAX 2D LVIDd:         5.50 cm LVIDs:         4.50 cm LV PW:         0.90 cm LV IVS:        0.70 cm LVOT diam:     2.10 cm LV SV:         69 LV SV Index:   37 LVOT Area:     3.46 cm  IVC IVC diam: 2.20 cm LEFT ATRIUM             Index       RIGHT ATRIUM           Index LA diam:        4.10 cm 2.21 cm/m  RA Area:     18.30 cm LA Vol (A2C):   86.5 ml 46.66 ml/m RA Volume:   47.00 ml  25.35 ml/m LA Vol (A4C):  76.1 ml 41.05 ml/m LA Biplane Vol: 84.3 ml 45.47 ml/m  AORTIC VALVE AV Area (Vmax):    1.78 cm AV Area (Vmean):   1.75 cm AV Area (VTI):     1.68 cm AV Vmax:           189.75 cm/s AV Vmean:          124.275 cm/s AV VTI:            0.409 m AV Peak Grad:      14.4 mmHg AV Mean Grad:      7.8 mmHg LVOT Vmax:         97.78 cm/s LVOT Vmean:        62.750 cm/s LVOT VTI:          0.199 m LVOT/AV VTI ratio: 0.49  AORTA Ao Root diam: 3.30 cm Ao Asc diam:  3.50 cm TRICUSPID VALVE TR Peak grad:   27.2 mmHg TR Vmax:        261.00 cm/s  SHUNTS Systemic VTI:  0.20 m Systemic Diam: 2.10 cm Rudean Haskell MD Electronically signed by Rudean Haskell MD Signature Date/Time: 09/13/2020/2:47:21 PM    Final     Cardiac Studies  LHC 09/15/2020   Ost RCA to Prox RCA lesion is 50% stenosed.   Prox LAD lesion is 30% stenosed with 0% stenosed side branch in 1st Diag.   Prox Cx to Mid  Cx lesion is 40% stenosed.   1.  Nonobstructive coronary artery disease with mild nonobstructive plaquing in the LAD, left circumflex, and moderate calcific stenosis of the RCA ostium. 2.  Low normal LVEDP   Recommend: No high-grade coronary stenoses identified.  Patient with femur fracture.  He should not be at excessive risk of ischemic cardiac complications.   TTE 09/13/2020  1. Left ventricular ejection fraction, by estimation, is 40 to 45%. The  left ventricle has mildly decreased function. The left ventricle  demonstrates global hypokinesis. Left ventricular diastolic parameters are  indeterminate.   2. Right ventricular systolic function is normal. The right ventricular  size is normal. There is normal pulmonary artery systolic pressure.   3. Left atrial size was moderately dilated.   4. The mitral valve is grossly normal. Mild mitral valve regurgitation.   5. The aortic valve is calcified. Aortic valve regurgitation is not  visualized. Aortic valve sclerosis is present, with no evidence of aortic  valve stenosis.   6. The inferior vena cava is dilated in size with >50% respiratory  variability, suggesting right atrial pressure of 8 mmHg.    Patient Profile  Kyle Farley is a 80 y.o. male with persistent atrial fibrillation, hypertension, COPD, AAA, tobacco abuse who was admitted on 09/12/2020 with mechanical fall and suffering a right periprosthetic femur fracture.  Cardiology was consulted for chest pain and preoperative assessment.  Underwent left heart catheterization which showed nonobstructive disease.  Assessment & Plan   Chest pain -Daily episodes of chest tightness.  Echo with reduced EF.  Left heart catheterization pursued.  Nonobstructive CAD found. -Suspect his shortness of breath and chest pain are related to COPD.  2.  Nonobstructive CAD -Aspirin and statin are recommended. -He may proceed to surgery at acceptable risk.  3.  Persistent atrial  fibrillation -Holding anticoagulation in the setting of need for hip surgery.  Can be restarted once hemostasis is achieved and there is no further bleeding risk. -No amiodarone due to lung disease. -He cannot be on flecainide due to CAD. -He has  really limited options for rhythm control strategy.  For now would recommend rate control.  We will start him on metoprolol succinate after surgery.  If he becomes hypotensive or has issues postoperatively amiodarone could be used short-term. -Thyroid needs to be addressed.  He is severely hypothyroid.  4.  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  Could be related to hip surgery and critical illness. -Alcohol abuse is reported.  6 beers per day.  Cessation is recommended.  Could be related as well. -We will get him on guideline directed medical therapy after surgery.  Needs to be on a beta-blocker likely ARB and MRA.  We can start SGLT2 inhibitor as well.  5. Hypothyroidism -TSH 43.  Low T4.  He is hypothyroid.  Likely explains his hyponatremia as well as low blood pressure at times. -Needs to be corrected per hospital medicine team. -likely exacerbating bradycardia   6. Anemia -2/2 fracture. No signs of bleeding.   For questions or updates, please contact [place] Please consult [email or web address] for contact info under   Time Spent with Patient: I have spent a total of 35 minutes with patient reviewing hospital notes, telemetry, EKGs, labs and examining the patient as well as establishing an assessment and plan that was discussed with the patient.  > 50% of time was spent in direct patient care.    Signed, Addison Naegeli. Audie Box, MD, [place]  09/15/2020 10:16 AM

## 2020-09-15 NOTE — Interval H&P Note (Signed)
History and Physical Interval Note:  09/15/2020 12:10 PM  Kyle Farley  has presented today for surgery, with the diagnosis of Right femoral shaft fracture.  The various methods of treatment have been discussed with the patient and family. After consideration of risks, benefits and other options for treatment, the patient has consented to  Procedure(s): OPEN REDUCTION INTERNAL FIXATION FEMORAL SHAFT FRACTURE (Right) as a surgical intervention.  The patient's history has been reviewed, patient examined, no change in status, stable for surgery.  I have reviewed the patient's chart and labs.  Questions were answered to the patient's satisfaction.     Lennette Bihari P Yolanda Huffstetler

## 2020-09-15 NOTE — Progress Notes (Signed)
Message left for son to call back and given consent for the pt to have surgery. The pt was not consented prior to his cardiac cath procedure, therefore a telephone consent is needed to procedure.

## 2020-09-15 NOTE — Progress Notes (Signed)
Tamika, RN from [facility]N collected the patient's ring-yellow band and took it back to his room 4N P01C.

## 2020-09-15 NOTE — Progress Notes (Signed)
Second attempt made to contact Kyle Farley for consent for surgery.

## 2020-09-15 NOTE — Anesthesia Procedure Notes (Signed)
Procedure Name: Intubation Date/Time: 09/15/2020 12:26 PM Performed by: Mariea Clonts, CRNA Pre-anesthesia Checklist: Patient identified, Emergency Drugs available, Suction available and Patient being monitored Patient Re-evaluated:Patient Re-evaluated prior to induction Oxygen Delivery Method: Circle System Utilized Preoxygenation: Pre-oxygenation with 100% oxygen Induction Type: IV induction Ventilation: Mask ventilation without difficulty Laryngoscope Size: Miller and 2 Grade View: Grade I Tube type: Oral Tube size: 7.5 mm Number of attempts: 1 Airway Equipment and Method: Stylet and Oral airway Placement Confirmation: ETT inserted through vocal cords under direct vision, positive ETCO2 and breath sounds checked- equal and bilateral Tube secured with: Tape Dental Injury: Teeth and Oropharynx as per pre-operative assessment

## 2020-09-15 NOTE — Progress Notes (Addendum)
Family Medicine Teaching Service Daily Progress Note Intern Pager: [phone]  Patient name: Kyle Farley Medical record number: [id number] Date of birth: [birth year] Age: 80 y.o. Gender: male  Primary Care Provider: Zoila Shutter, NP Consultants: Orthopedics, cardiology Code Status: Full  Pt Overview and Major Events to Date:  8/26: admitted for R inferior pubic ramus and femur fx 8/27: transfused 1u pRBC, echo, A. fib 8/28: hypotensive, stable 8:29: LHC, Right hip surgery 8/30: restarted heparin  Assessment and Plan: Kyle Farley is a 80 y/o M presenting w/ a right femur and pelvic rami fracture now s/p repair. PMHx significant for A. Fib, COPD, HTN, GERD, alcohol abuse, hypothyroidism, BPH.   Right femur fracture s/p open reduction internal fixation  nondisplaced R inferior pubic ramus fracture Patient received surgery yesterday. Pt required required 2 units pRBCs during surgery. Reports pain is 6/10 at this time but much better than before surgery.  Patient is no longer an altered state that is present post surgery. -Orthopedics following, appreciate recommendations -Tylenol 650 mg every 4 hours as needed -Oxycodone 2.5-5 mg every 4 hours as needed for severe pain -PT/OT eval and treat -Start subcu heparin  Chest pain  A. fib  HFrEF  CAD Patient not actively endorsing chest pain which she is endorsed in the past with ambulation.  Echo noted LVEF 40-45% with global hypokinesis.  Left heart catheterization prior to surgery noted mild nonobstructive plaquing in the LAD, LCX, and moderate calcific stenosis of the RCA.  -Cardiology following, appreciate recommendations -Start metoprolol tartrate 12.5 mg twice daily to metoprolol succinate '50mg'$  daily -Continue ASA 81 mg daily and Lipitor 40 mg daily -Daily weights  Hypotension Patient has had asymptomatic soft pressures likely secondary to blood loss.  Most recent blood pressures have been stable with last blood pressure  129/76. -Continue Losartan 12.'5mg'$  daily and transition to metoprolol succinate '50mg'$  daily  Anemia Patient is received 2 units pRBC prior to surgery likely 2/2 bleeding he lost 1 L in surgery and require 2 more units pRBC during surgery  Postsurgical Hgb was 10.2.  -Monitor hemoglobin and signs of bleeding  Hyponatremia Sodium 127 today.  Urine output at 0.7 cc/kg/hr.  -Strict I's and O's -Monitor BMP  Constipation Patient not had bowel movement since admission.  He denies abdominal pain.  -MiraLAX daily and senna twice daily  Difficulty sleeping -Melatonin '3mg'$  nightly   COPD, BPH, GERD, hypothyroidism -Continue management as previously adjusted  FEN/GI: Heart healthy PPx: Heparin Dispo:SNF 1 to 2 days. Barriers include orthopedic status and anticoagulation.   Subjective:  Patient states he is doing well post surgery.  Denies chest pain, difficulty breathing, abdominal pain.  Endorses 6/10 right hip pain.  States he generally has difficulty falling asleep at night and takes NyQuil at home for that.  Objective: Temp:  [97.4 F (36.3 C)-98.4 F (36.9 C)] 98 F (36.7 C) (08/30 1104) Pulse Rate:  [81-103] 103 (08/30 1104) Resp:  [14-22] 20 (08/30 1104) BP: (92-129)/(41-90) 97/64 (08/30 1104) SpO2:  [90 %-100 %] 91 % (08/30 1104) Weight:  [80.1 kg] 80.1 kg (08/30 0500) Physical Exam: General: Alert and oriented x4, no acute distress Cardiovascular: RRR, no murmurs appreciated Respiratory: Wheezing s/p COPD treatment Abdomen: Soft, nontender, normoactive bowel sounds Extremities: 2+ radial pulses, surgical dressing and Ace wrap in place on right hip, no bleeding appreciated  Laboratory: Recent Labs  Lab 09/14/20 2007 09/15/20 0453 09/15/20 2142 09/16/20 0238  WBC 7.2 7.9  --  12.1*  HGB 7.5* 8.6*  10.2* 10.2*  HCT 23.3* 25.5* 31.8* 30.2*  PLT 173 181  --  173   Recent Labs  Lab 09/13/20 0018 09/13/20 1514 09/14/20 0350 09/15/20 0453 09/16/20 0238  NA 126*  125* 126* 127* 127*  K 3.7 4.0 4.0 4.1 4.9  CL 94* 93* 94* 93* 95*  CO2 '27 26 25 25 26  '$ BUN 7* '11 10 10 13  '$ CREATININE 0.86 0.98 0.92 0.91 0.91  CALCIUM 7.9* 8.0* 8.3* 8.4* 8.3*  PROT 4.8* 4.7*  --   --   --   BILITOT 0.6 0.7  --   --   --   ALKPHOS 105 93  --   --   --   ALT 11 11  --   --   --   AST 16 15  --   --   --   GLUCOSE 133* 97 104* 92 140*    Imaging/Diagnostic Tests: No new imaging/diagnostic tests at this time.  Wells Guiles, DO 09/16/2020, 12:40 PM PGY-1, [place] Intern pager: [phone], text pages welcome

## 2020-09-15 NOTE — Interval H&P Note (Signed)
History and Physical Interval Note:  09/15/2020 7:43 AM  Kyle Farley  has presented today for surgery, with the diagnosis of Pre op.  The various methods of treatment have been discussed with the patient and family. After consideration of risks, benefits and other options for treatment, the patient has consented to  Procedure(s): LEFT HEART CATH AND CORONARY ANGIOGRAPHY (N/A) as a surgical intervention.  The patient's history has been reviewed, patient examined, no change in status, stable for surgery.  I have reviewed the patient's chart and labs.  Questions were answered to the patient's satisfaction.     Sherren Mocha

## 2020-09-15 NOTE — H&P (View-Only) (Signed)
Ortho Trauma Note  Seen and examined.  Patient with periprosthetic femoral shaft fracture.  Underwent cardiac catheterization this morning which showed no significant lesions and he has been cleared for surgery.  Had seen the patient earlier this week and discussed risks and benefits with the patient's son over the phone.  Risks include but not limited to bleeding, infection, malunion, nonunion, hardware failure, hardware irritation, nerve or vessel injury, DVT, even the possibility anesthetic complications.  They agreed to proceed with surgery and consent was obtained.  Shona Needles, MD [facility] [phone] (office) [email or web address]

## 2020-09-15 NOTE — Anesthesia Preprocedure Evaluation (Addendum)
Anesthesia Evaluation  Patient identified by MRN, date of birth, ID band Patient awake    Reviewed: Allergy & Precautions, NPO status , Patient's Chart, lab work & pertinent test results  Airway Mallampati: III  TM Distance: >3 FB Neck ROM: Full    Dental  (+) Upper Dentures, Edentulous Lower   Pulmonary COPD,  COPD inhaler and oxygen dependent, Current Smoker and Patient abstained from smoking.,    Pulmonary exam normal breath sounds clear to auscultation       Cardiovascular hypertension, Pt. on medications +CHF  Normal cardiovascular exam+ dysrhythmias Atrial Fibrillation  Rhythm:Regular Rate:Normal  ECG: a-fib, rate 57  ECHO: Left ventricular ejection fraction, by estimation, is 40 to 45%. The left ventricle has mildly decreased function. The left ventricle demonstrates global hypokinesis. Left ventricular diastolic parameters are indeterminate. Right ventricular systolic function is normal. The right ventricular size is normal. There is normal pulmonary artery systolic pressure. Left atrial size was moderately dilated. The mitral valve is grossly normal. Mild mitral valve regurgitation. The aortic valve is calcified. Aortic valve regurgitation is not visualized. Aortic valve sclerosis is present, with no evidence of aortic valve stenosis. The inferior vena cava is dilated in size with >50% respiratory variability, suggesting right atrial pressure of 8 mmHg.   Neuro/Psych negative neurological ROS  negative psych ROS   GI/Hepatic GERD  Medicated and Controlled,(+)     substance abuse  ,   Endo/Other  Hypothyroidism Hyponatremia   Renal/GU negative Renal ROS     Musculoskeletal  (+) Arthritis ,   Abdominal   Peds  Hematology  (+) Blood dyscrasia, anemia , HLD   Anesthesia Other Findings Right femoral shaft fracture  Reproductive/Obstetrics                            Anesthesia  Physical Anesthesia Plan  ASA: 4  Anesthesia Plan: General   Post-op Pain Management:    Induction: Intravenous  PONV Risk Score and Plan: 1 and Ondansetron, Dexamethasone, Midazolam and Treatment may vary due to age or medical condition  Airway Management Planned: Oral ETT  Additional Equipment:   Intra-op Plan:   Post-operative Plan: Extubation in OR and Possible Post-op intubation/ventilation  Informed Consent: I have reviewed the patients History and Physical, chart, labs and discussed the procedure including the risks, benefits and alternatives for the proposed anesthesia with the patient or authorized representative who has indicated his/her understanding and acceptance.     Dental advisory given  Plan Discussed with: CRNA  Anesthesia Plan Comments:       Anesthesia Quick Evaluation

## 2020-09-15 NOTE — Op Note (Signed)
Orthopaedic Surgery Operative Note (CSN: [id number] ) Date of Surgery: 09/15/2020  Admit Date: 09/12/2020   Diagnoses: Pre-Op Diagnoses: Right periprosthetic proximal femur fracture  Post-Op Diagnosis: Same  Procedures: CPT 27244-Open reduction internal fixation of right proximal femur fracture  Surgeons : Primary: Shona Needles, MD  Assistant: Patrecia Pace, PA-C  Location: OR 3   Anesthesia:General   Antibiotics: Ancef 2g preop with 1 gm vancomycin powder placed topically   Tourniquet time: None used    Estimated Blood Loss: 123XX123 mL  Complications:None  Specimens:None   Implants: Implant Name Type Inv. Item Serial No. Manufacturer Lot No. LRB No. Used Action  CABLE CERLAGE W/CRIMP 1.8MM - [id number] Cable CABLE CERLAGE W/CRIMP 1.8MM  [employer](ORTH,TRAU,BIO,SG) [id number] Right 1 Implanted  CABLE CERLAGE W/CRIMP 1.8MM - [id number] Cable CABLE CERLAGE W/CRIMP 1.8MM  [employer](ORTH,TRAU,BIO,SG) [id number] Right 1 Implanted  CABLE CERLAGE W/CRIMP 1.8MM - [id number] Cable CABLE CERLAGE W/CRIMP 1.8MM  [employer](ORTH,TRAU,BIO,SG) [id number] Right 1 Implanted  PLATE PROXIMAL FEMUR 12H RT - [id number] Plate PLATE PROXIMAL FEMUR 12H RT  [employer](ORTH,TRAU,BIO,SG)  Right 1 Implanted  CAP LOCK NCB - [id number] Cap CAP LOCK NCB  [employer](ORTH,TRAU,BIO,SG)  Right 8 Implanted  SCREW NCB 4.JC:[id number] CO:[id number] Screw SCREW NCB 4.JC:5662974  [employer](ORTH,TRAU,BIO,SG)  Right 1 Implanted  SCREW HUM NCB PA ST 4X60 - [id number] Screw SCREW HUM NCB PA ST 4X60  [employer](ORTH,TRAU,BIO,SG)  Right 1 Implanted  SCREW NCB 4.QZ:[id number] CO:[id number] Screw SCREW NCB 4.QZ:[id number]  [employer](ORTH,TRAU,BIO,SG)  Right 1 Implanted  SCREW NCB 4.0MX42M - [id number] Screw SCREW NCB 4.0MX42M  [employer](ORTH,TRAU,BIO,SG)  Right 1 Implanted  SCREW NCB 4.0MX50M CO:[id number] Screw SCREW NCB 4.0MX50M  [employer](ORTH,TRAU,BIO,SG)  Right 1 Implanted  SCREW NCB 4.0MX34M - [id number] Screw SCREW NCB 4.0MX34M  ZIMMER  RECON(ORTH,TRAU,BIO,SG)  Right 1 Implanted  SCREW NCB 4.0X36MM - [id number] Screw SCREW NCB 4.0X36MM  [employer](ORTH,TRAU,BIO,SG)  Right 1 Implanted  SCREW CORTICAL NCB 5.0X40 - [id number] Screw SCREW CORTICAL NCB 5.0X40  [employer](ORTH,TRAU,BIO,SG)  Right 1 Implanted  SCREW CORT NCB SELFTAP 5.0X42 - [id number] Screw SCREW CORT NCB SELFTAP 5.0X42  [employer](ORTH,TRAU,BIO,SG)  Right 1 Implanted     Indications for Surgery: 80 year old male who sustained a right periprosthetic Proximal femur fracture.  Due to the unstable nature of his injury I recommended proceeding with open reduction internal fixation.  Risks and benefits were discussed with the patient and his son.  Risks included but not limited to bleeding, infection, malunion, nonunion, hardware failure, hardware irritation, nerve or vessel injury, DVT, even the possibility anesthetic complications.  The patient agreed to proceed with surgery and consent was obtained.  Operative Findings: Open reduction internal fixation of right proximal femur periprosthetic fracture using [employer] cables x3 with a proximal 12 hole [employer] NCB femoral locking plate  Procedure: The patient was identified in the preoperative holding area. Consent was confirmed with the patient and their family and all questions were answered. The operative extremity was marked after confirmation with the patient. he was then brought back to the operating room by our anesthesia colleagues.  He was placed under general anesthetic and carefully transferred over to a radiolucent flat top table.  A bump was placed under his operative hip.  The right lower extremity was then prepped and draped in usual sterile fashion.  A timeout was performed to verify the patient, the procedure, and the extremity.  Preoperative antibiotics were dosed.  Fluoroscopic imaging was obtained to show the unstable nature of  his injury.  He direct lateral approach the femoral shaft was made  and carried down through skin subcutaneous tissue.  I incised the IT band in line with my incision.  I then mobilized the vastus lateralis and reflected the vastus along the intermuscular septum.  I cauterized and clipped some of the perforating vessels.  I was able to expose the lateral cortex of the femur all the way from the greater trochanter to the distal extent of the fracture.  I then cleaned out the hematoma and proceeded to perform multiple reduction maneuvers.  I eventually was able to get anatomic reduction using multiple reduction tenaculums and clamps.  Fluoroscopic imaging showed anatomic reduction.  I then proceeded to provisionally hold the reduction using [employer] cables.  A cable passer was used to wrap the 3 cables around the femur.  They retightened to 100 mmHg.  They were crimped and cut and the clamps were then removed.  Once I had provisional fixation I then chose a 12 hole [employer] proximal femoral locking plate.  It was held provisionally with a K wire proximally and a percutaneous 3.3 mm drill bit distally.  At the side and the plate in the cortical bone to try to prevent a stress riser.  4.0 millimeter screws were then drilled and placed anterior and posterior to the.  Excellent fixation was obtained.  I then percutaneously placed a 5.0 millimeter screws in the femoral shaft to bring the plate flush 2.3 mm drill bit distally was removed and a 4.0 millimeter screw was placed.  Locking caps were placed on the 5.0 millimeter screws in the targeting arm was removed.  I then proceeded to place off for more screws in the proximal segment anterior and posterior to the stem locking caps were placed on all the screws.  Final fluoroscopic imaging was then obtained.  The incision was copiously irrigated.  A gram of vancomycin powder was placed into the incision.  Layered closure of 0 Vicryl for the IT band, 2-0 Vicryl and 3-0 nylon for the skin.  Sterile dressing was applied and the  patient was awoken from anesthesia and taken to the PACU in stable condition.  Post Op Plan/Instructions: The patient will be 50% partial weightbearing on the right lower extremity.  He will receive postoperative Ancef.  He will be started on his Eliquis once his hemoglobin is stabilized.  We will have him mobilize with physical and Occupational Therapy.  I was present and performed the entire surgery.  Patrecia Pace, PA-C did assist me throughout the case. An assistant was necessary given the difficulty in approach, maintenance of reduction and ability to instrument the fracture.   Katha Hamming, MD [facility]

## 2020-09-15 NOTE — Anesthesia Postprocedure Evaluation (Signed)
Anesthesia Post Note  Patient: [name]  Procedure(s) Performed: OPEN REDUCTION INTERNAL FIXATION FEMORAL SHAFT FRACTURE (Right: Leg Upper)     Patient location during evaluation: PACU Anesthesia Type: General Level of consciousness: awake Pain management: pain level controlled Vital Signs Assessment: post-procedure vital signs reviewed and stable Respiratory status: spontaneous breathing, nonlabored ventilation, respiratory function stable and patient connected to nasal cannula oxygen Cardiovascular status: blood pressure returned to baseline and stable Postop Assessment: no apparent nausea or vomiting Anesthetic complications: no   No notable events documented.  Last Vitals:  Vitals:   09/15/20 1654 09/15/20 1853  BP: (!) 99/41 119/86  Pulse: 94 86  Resp: 18 15  Temp: 36.6 C (!) 36.3 C  SpO2: 100% 100%    Last Pain:  Vitals:   09/15/20 1938  TempSrc:   PainSc: 8                  Gaylia Kassel P Layanna Charo

## 2020-09-15 NOTE — Progress Notes (Signed)
Telephone consent received, and witnessed by two nurses from the son [name] for the patient Kyle Farley to have surgery with Dr. Doreatha Martin.

## 2020-09-15 NOTE — Transfer of Care (Signed)
Immediate Anesthesia Transfer of Care Note  Patient: Kyle Farley  Procedure(s) Performed: OPEN REDUCTION INTERNAL FIXATION FEMORAL SHAFT FRACTURE (Right: Leg Upper)  Patient Location: PACU  Anesthesia Type:General  Level of Consciousness: awake, alert  and oriented  Airway & Oxygen Therapy: Patient Spontanous Breathing and Patient connected to face mask oxygen  Post-op Assessment: Report given to RN and Post -op Vital signs reviewed and stable  Post vital signs: Reviewed and stable  Last Vitals:  Vitals Value Taken Time  BP 113/86 09/15/20 1517  Temp 36.7 C 09/15/20 1517  Pulse 81 09/15/20 1523  Resp 19 09/15/20 1523  SpO2 93 % 09/15/20 1523  Vitals shown include unvalidated device data.  Last Pain:  Vitals:   09/15/20 1517  TempSrc:   PainSc: 0-No pain      Patients Stated Pain Goal: 2 ([id number])  Complications: No notable events documented.

## 2020-09-15 NOTE — Progress Notes (Signed)
Ortho Trauma Note  Seen and examined.  Patient with periprosthetic femoral shaft fracture.  Underwent cardiac catheterization this morning which showed no significant lesions and he has been cleared for surgery.  Had seen the patient earlier this week and discussed risks and benefits with the patient's son over the phone.  Risks include but not limited to bleeding, infection, malunion, nonunion, hardware failure, hardware irritation, nerve or vessel injury, DVT, even the possibility anesthetic complications.  They agreed to proceed with surgery and consent was obtained.  Shona Needles, MD [facility] [phone] (office) [email or web address]

## 2020-09-16 ENCOUNTER — Other Ambulatory Visit (HOSPITAL_COMMUNITY): Payer: Self-pay

## 2020-09-16 ENCOUNTER — Encounter (HOSPITAL_COMMUNITY): Payer: Self-pay | Admitting: Student

## 2020-09-16 DIAGNOSIS — I480 Paroxysmal atrial fibrillation: Secondary | ICD-10-CM | POA: Diagnosis not present

## 2020-09-16 DIAGNOSIS — L8990 Pressure ulcer of unspecified site, unspecified stage: Secondary | ICD-10-CM

## 2020-09-16 DIAGNOSIS — S7291XA Unspecified fracture of right femur, initial encounter for closed fracture: Secondary | ICD-10-CM | POA: Diagnosis not present

## 2020-09-16 DIAGNOSIS — I10 Essential (primary) hypertension: Secondary | ICD-10-CM | POA: Diagnosis not present

## 2020-09-16 DIAGNOSIS — I5021 Acute systolic (congestive) heart failure: Secondary | ICD-10-CM | POA: Diagnosis not present

## 2020-09-16 HISTORY — DX: Pressure ulcer of unspecified site, unspecified stage: L89.90

## 2020-09-16 LAB — BASIC METABOLIC PANEL
Anion gap: 6 (ref 5–15)
BUN: 13 mg/dL (ref 8–23)
CO2: 26 mmol/L (ref 22–32)
Calcium: 8.3 mg/dL — ABNORMAL LOW (ref 8.9–10.3)
Chloride: 95 mmol/L — ABNORMAL LOW (ref 98–111)
Creatinine, Ser: 0.91 mg/dL (ref 0.61–1.24)
GFR, Estimated: >60 mL/min (ref >60)
Glucose, Bld: 140 mg/dL — ABNORMAL HIGH (ref 70–99)
Potassium: 4.9 mmol/L (ref 3.5–5.1)
Sodium: 127 mmol/L — ABNORMAL LOW (ref 135–145)

## 2020-09-16 LAB — CBC
HCT: 30.2 % — ABNORMAL LOW (ref 39.0–52.0)
HCT: 26.9 % — ABNORMAL LOW (ref 39.0–52.0)
Hemoglobin: 10.2 g/dL — ABNORMAL LOW (ref 13.0–17.0)
Hemoglobin: 8.9 g/dL — ABNORMAL LOW (ref 13.0–17.0)
MCH: 29.7 pg (ref 26.0–34.0)
MCH: 29.4 pg (ref 26.0–34.0)
MCHC: 33.8 g/dL (ref 30.0–36.0)
MCHC: 33.1 g/dL (ref 30.0–36.0)
MCV: 88 fL (ref 80.0–100.0)
MCV: 88.8 fL (ref 80.0–100.0)
Platelets: 173 10*3/uL (ref 150–400)
Platelets: 206 10*3/uL (ref 150–400)
RBC: 3.43 MIL/uL — ABNORMAL LOW (ref 4.22–5.81)
RBC: 3.03 MIL/uL — ABNORMAL LOW (ref 4.22–5.81)
RDW: 15 % (ref 11.5–15.5)
RDW: 14.7 % (ref 11.5–15.5)
WBC: 12.1 10*3/uL — ABNORMAL HIGH (ref 4.0–10.5)
WBC: 14.6 10*3/uL — ABNORMAL HIGH (ref 4.0–10.5)
nRBC: 0 % (ref 0.0–0.2)
nRBC: 0 % (ref 0.0–0.2)

## 2020-09-16 LAB — TYPE AND SCREEN
ABO/RH(D): A POS
Antibody Screen: NEGATIVE
Unit division: 0
Unit division: 0
Unit division: 0
Unit division: 0

## 2020-09-16 LAB — BPAM RBC
Blood Product Expiration Date: 202209172359
Blood Product Expiration Date: 202209172359
Blood Product Expiration Date: 202209212359
Blood Product Expiration Date: 202209212359
ISSUE DATE / TIME: 202208271716
ISSUE DATE / TIME: 202208282314
ISSUE DATE / TIME: 202208291303
ISSUE DATE / TIME: 202208291337
Unit Type and Rh: 6200
Unit Type and Rh: 6200
Unit Type and Rh: 6200
Unit Type and Rh: 6200

## 2020-09-16 LAB — T3, FREE: T3, Free: 1.4 pg/mL — ABNORMAL LOW (ref 2.0–4.4)

## 2020-09-16 MED ORDER — METOPROLOL SUCCINATE ER 50 MG PO TB24
50.0000 mg | ORAL_TABLET | Freq: Every day | ORAL | Status: DC
  Administered 2020-09-16 – 2020-09-20 (×4): 50 mg via ORAL
  Filled 2020-09-16 (×5): qty 1

## 2020-09-16 MED ORDER — LOSARTAN POTASSIUM 25 MG PO TABS
12.5000 mg | ORAL_TABLET | Freq: Every day | ORAL | Status: DC
  Administered 2020-09-16 – 2020-09-18 (×2): 12.5 mg via ORAL
  Filled 2020-09-16 (×4): qty 0.5

## 2020-09-16 MED ORDER — POLYETHYLENE GLYCOL 3350 17 G PO PACK
17.0000 g | PACK | Freq: Every day | ORAL | Status: DC
  Administered 2020-09-16: 17 g via ORAL
  Filled 2020-09-16: qty 1

## 2020-09-16 MED ORDER — METOPROLOL TARTRATE 12.5 MG HALF TABLET
12.5000 mg | ORAL_TABLET | Freq: Two times a day (BID) | ORAL | Status: DC
  Administered 2020-09-16: 12.5 mg via ORAL
  Filled 2020-09-16: qty 1

## 2020-09-16 MED ORDER — DAPAGLIFLOZIN PROPANEDIOL 10 MG PO TABS
10.0000 mg | ORAL_TABLET | Freq: Every day | ORAL | Status: DC
  Administered 2020-09-16 – 2020-09-18 (×3): 10 mg via ORAL
  Filled 2020-09-16 (×4): qty 1

## 2020-09-16 MED ORDER — ACETAMINOPHEN 325 MG PO TABS
650.0000 mg | ORAL_TABLET | ORAL | Status: DC
  Administered 2020-09-16 – 2020-09-20 (×21): 650 mg via ORAL
  Filled 2020-09-16 (×22): qty 2

## 2020-09-16 MED ORDER — HEPARIN SODIUM (PORCINE) 5000 UNIT/ML IJ SOLN
5000.0000 [IU] | Freq: Three times a day (TID) | INTRAMUSCULAR | Status: DC
  Administered 2020-09-16 – 2020-09-17 (×3): 5000 [IU] via SUBCUTANEOUS
  Filled 2020-09-16 (×4): qty 1

## 2020-09-16 MED ORDER — MELATONIN 3 MG PO TABS
3.0000 mg | ORAL_TABLET | Freq: Every day | ORAL | Status: DC
  Administered 2020-09-16 – 2020-09-19 (×4): 3 mg via ORAL
  Filled 2020-09-16 (×4): qty 1

## 2020-09-16 NOTE — Evaluation (Signed)
Occupational Therapy Evaluation Patient Details Name: Kyle Farley MRN: [id number] DOB: January 02, 1941 Today's Date: 09/16/2020    History of Present Illness Pt is a 80 yr old male who presented  due to fall. Pt s/p open reduction internal fixation of right proximal femur fracture following L heart cath and coronary angiography.   Pt's PMh but not limited to; afib, COPD, alcohol use, prior r hip fx   Clinical Impression   Pt at PLOF reported up to 20-30 falls prior to admission and was moving into their son's house due to this to increase in support. Pt in session completed supine to sitting with HOB elevated, bed rails and max x2 assist. Pt then was able to complete sit to stand with mod x2 due to decrease control of RW and following WB precautions. Kyle Farley reported feeling out of breath and dizzy with any transitional movements but decrease o2 readings in session into 70-80s but had poor signal. Pt currently with functional limitations due to the deficits listed below (see OT Problem List).  Pt will benefit from skilled OT to increase their safety and independence with ADL and functional mobility for ADL to facilitate discharge to venue listed below.      Follow Up Recommendations  SNF;Supervision/Assistance - 24 hour    Equipment Recommendations       Recommendations for Other Services       Precautions / Restrictions Precautions Precautions: Fall;Other (comment) (monitor o2 readings, decrease skin integrity) Restrictions Weight Bearing Restrictions: Yes RLE Weight Bearing: Partial weight bearing RLE Partial Weight Bearing Percentage or Pounds: 50%      Mobility Bed Mobility Overal bed mobility: Needs Assistance Bed Mobility: Rolling;Supine to Sit Rolling: Max assist;+2 for physical assistance;+2 for safety/equipment   Supine to sit: Max assist;+2 for physical assistance;+2 for safety/equipment;HOB elevated (use of bed rails)     General bed mobility comments: pt cued once  sitting at EOB on breathing techniques    Transfers Overall transfer level: Needs assistance Equipment used: Rolling walker (2 wheeled) Transfers: Sit to/from Stand Sit to Stand: Mod assist;+2 physical assistance;+2 safety/equipment         General transfer comment: Pt needs max cues on how to sequence and follow WB precautions    Balance Overall balance assessment: Needs assistance Sitting-balance support: Bilateral upper extremity supported;Feet supported Sitting balance-Leahy Scale: Fair     Standing balance support: Bilateral upper extremity supported Standing balance-Leahy Scale: Poor Standing balance comment: decrease control of walker                           ADL either performed or assessed with clinical judgement   ADL Overall ADL's : Needs assistance/impaired Eating/Feeding: Independent;Cueing for safety   Grooming: Wash/dry hands;Wash/dry face;Cueing for safety;Cueing for sequencing;Sitting;Set up   Upper Body Bathing: Min guard;Cueing for safety;Cueing for sequencing;Sitting   Lower Body Bathing: Maximal assistance;Cueing for safety;Cueing for sequencing;Sit to/from stand;Sitting/lateral leans;+2 for physical assistance;+2 for safety/equipment   Upper Body Dressing : Min guard;Cueing for safety;Cueing for sequencing;Sitting   Lower Body Dressing: Maximal assistance;+2 for physical assistance;+2 for safety/equipment;Cueing for safety;Cueing for sequencing;Sit to/from stand   Toilet Transfer: Moderate assistance;+2 for physical assistance;+2 for safety/equipment;Cueing for safety;Cueing for sequencing;BSC;RW   Toileting- Clothing Manipulation and Hygiene: Maximal assistance;+2 for physical assistance;+2 for safety/equipment;Cueing for safety;Cueing for sequencing;Sit to/from stand       Functional mobility during ADLs: Moderate assistance;+2 for physical assistance;+2 for safety/equipment;Cueing for safety;Cueing for sequencing;Rolling  walker  General ADL Comments: Pt needs increase in rest breaks as easily becomes short of breath with activity     Vision Patient Visual Report: No change from baseline       Perception     Praxis      Pertinent Vitals/Pain Pain Assessment: 0-10 Pain Score: 6  Pain Location: sx site/RLE Pain Descriptors / Indicators: Aching;Grimacing;Guarding Pain Intervention(s): Limited activity within patient's tolerance;Monitored during session     Hand Dominance Right   Extremity/Trunk Assessment Upper Extremity Assessment Upper Extremity Assessment: Overall WFL for tasks assessed   Lower Extremity Assessment Lower Extremity Assessment: Defer to PT evaluation;RLE deficits/detail RLE Deficits / Details: s/p ORIF   Cervical / Trunk Assessment Cervical / Trunk Assessment: Kyphotic   Communication Communication Communication: HOH   Cognition Arousal/Alertness: Awake/alert Behavior During Therapy: WFL for tasks assessed/performed Overall Cognitive Status: Within Functional Limits for tasks assessed                                     General Comments  pt noted to have scabs on skin and very easily opened    Exercises     Shoulder Instructions      Home Living Family/patient expects to be discharged to:: Private residence Living Arrangements: Spouse/significant other (moved into son's home) Available Help at Discharge: Family (son) Type of Home: House Home Access: Stairs to enter Technical brewer of Steps: 3 Entrance Stairs-Rails: Can reach both Home Layout: One level     Bathroom Shower/Tub: Occupational psychologist: Standard Bathroom Accessibility: Yes How Accessible: Accessible via walker Home Equipment: [place] - 4 wheels;Other (comment);Shower seat;Grab bars - toilet;Toilet riser;Grab bars - tub/shower (walking stick)   Additional Comments: Pt reported they were moving into son's home when they fell and information listed above is the  son house      Prior Functioning/Environment Level of Independence: Needs assistance  Gait / Transfers Assistance Needed: Per pt using 4WW most of the time with occaionally walking stick depending on distance ADL's / Homemaking Assistance Needed: pt had an aie 3 x week completion of hygiene if needed and a nurse coming in 1-2 x week to assist with medication management   Comments: Pt was working on moving into the son's house due to up to 20-30 falls per pt "I would sneeze wrong and fall". Pt also reported out at the stores often required use of scottoer in stores        OT Problem List: Decreased strength;Decreased activity tolerance;Impaired balance (sitting and/or standing);Decreased safety awareness;Decreased knowledge of use of DME or AE;Cardiopulmonary status limiting activity;Pain      OT Treatment/Interventions: Self-care/ADL training;Therapeutic exercise;Energy conservation;DME and/or AE instruction;Therapeutic activities;Patient/family education;Balance training    OT Goals(Current goals can be found in the care plan section) Acute Rehab OT Goals Patient Stated Goal: TO be able to get out of bed and to the recliner OT Goal Formulation: With patient Time For Goal Achievement: 10/04/20 Potential to Achieve Goals: Good ADL Goals Pt Will Perform Upper Body Bathing: with modified independence;sitting Pt Will Perform Lower Body Bathing: with mod assist;sit to/from stand Pt Will Transfer to Toilet: with min assist;ambulating;grab bars Pt Will Perform Tub/Shower Transfer: with min assist;shower seat;ambulating;rolling walker  OT Frequency: Min 2X/week   Barriers to D/C:            Co-evaluation PT/OT/SLP Co-Evaluation/Treatment: Yes Reason for Co-Treatment: Complexity of the patient's impairments (multi-system involvement)  OT goals addressed during session: ADL's and self-care      AM-PAC OT "6 Clicks" Daily Activity     Outcome Measure Help from another person eating  meals?: None Help from another person taking care of personal grooming?: A Little Help from another person toileting, which includes using toliet, bedpan, or urinal?: A Lot Help from another person bathing (including washing, rinsing, drying)?: A Lot Help from another person to put on and taking off regular upper body clothing?: A Little Help from another person to put on and taking off regular lower body clothing?: A Lot 6 Click Score: 16   End of Session Equipment Utilized During Treatment: Gait belt;Rolling walker Nurse Communication: Mobility status;Other (comment) (skin)  Activity Tolerance: Patient limited by fatigue;Other (comment) (o2/dizziness) Patient left: in chair;with call bell/phone within reach;with chair alarm set;with nursing/sitter in room  OT Visit Diagnosis: Unsteadiness on feet (R26.81);Other abnormalities of gait and mobility (R26.89);Repeated falls (R29.6);Muscle weakness (generalized) (M62.81);History of falling (Z91.81);Pain Pain - Right/Left: Right Pain - part of body: Leg                Time: [id number] OT Time Calculation (min): 31 min Charges:  OT General Charges $OT Visit: 1 Visit OT Evaluation $OT Eval Low Complexity: [place] OTR/L  Acute Rehab Services  [phone] office number [phone] pager number   Joeseph Amor 09/16/2020, 11:00 AM

## 2020-09-16 NOTE — Progress Notes (Signed)
FPTS Brief Progress Note  S Saw patient at bedside this evening. Patient was sleeping comfortably. I did not wake the patient. No concerns from night RN.   O: BP (!) 113/91 (BP Location: Left Arm)   Pulse 99   Temp 97.9 F (36.6 C) (Oral)   Resp 17   Ht '5\' 9"'$  (1.753 m)   Wt 80.1 kg   SpO2 99%   BMI 26.08 kg/m    General: comfortable sleeping  A/P: Plan per day team  Hb 8.9 this evening -Continue to monitor Hb  - Orders reviewed. Labs for AM ordered, which was adjusted as needed.    Lattie Haw, MD 09/16/2020, 8:03 PM PGY-3, [facility] Family [facility] Night Resident  Please page [phone] with questions.

## 2020-09-16 NOTE — TOC CAGE-AID Note (Signed)
Transition of Care [facility]) - CAGE-AID Screening   Patient Details  Name: Kyle Farley MRN: [id number] Date of Birth: 1940/10/19  Transition of Care [facility]) CM/SW Contact:    Salvadore Valvano C Tarpley-Carter, [place] Phone Number: 09/16/2020, 3:17 PM   Clinical Narrative: Pt participated in [place].  Pt stated he does not use substance or ETOH.  Pt was not offered resources, due to no usage of substance or ETOH.     Tatiyana Foucher Tarpley-Carter, MSW, LCSW-A Pronouns:  She/Her/Hers [facility] Clinical Social Worker Direct Number:  [phone] [email or web address]   CAGE-AID Screening:    Have You Ever Felt You Ought to Cut Down on Your Drinking or Drug Use?: No Have People Annoyed You By [employer] Your Drinking Or Drug Use?: No Have You Felt Bad Or Guilty About Your Drinking Or Drug Use?: No Have You Ever Had a Drink or Used Drugs First Thing In The Morning to Steady Your Nerves or to Get Rid of a Hangover?: No CAGE-AID Score: 0  Substance Abuse Education Offered: No

## 2020-09-16 NOTE — Progress Notes (Signed)
Cardiology Progress Note  Patient ID: Kyle Farley MRN: [id number] DOB: February 28, 1940 Date of Encounter: 09/16/2020  Primary Cardiologist: Shirlee More, MD  Subjective   Chief Complaint: Right hip pain.  HPI: Right radial cath site clean and dry.  Reports right hip pain.  Denies any chest pain or trouble breathing.  ROS:  All other ROS reviewed and negative. Pertinent positives noted in the HPI.     Inpatient Medications  Scheduled Meds:  aspirin EC  81 mg Oral Daily   atorvastatin  40 mg Oral Daily   feeding supplement  237 mL Oral BID BM   fluticasone furoate-vilanterol  1 puff Inhalation Daily   heparin  5,000 Units Subcutaneous Q8H   levothyroxine  50 mcg Oral QAC breakfast   lidocaine  1 patch Transdermal Q24H   metoprolol tartrate  12.5 mg Oral BID   multivitamin with minerals  1 tablet Oral Daily   pantoprazole  40 mg Oral Daily   polyethylene glycol  17 g Oral Daily   senna  1 tablet Oral BID   tamsulosin  0.4 mg Oral Daily   umeclidinium bromide  1 puff Inhalation Daily   Continuous Infusions:  sodium chloride     sodium chloride      ceFAZolin (ANCEF) IV 2 g (09/16/20 0428)   PRN Meds: acetaminophen, albuterol, oxyCODONE   Vital Signs   Vitals:   09/16/20 0500 09/16/20 0756 09/16/20 0820 09/16/20 1104  BP:  125/78  97/64  Pulse:  95  (!) 103  Resp:  16  20  Temp:  98.4 F (36.9 C)  98 F (36.7 C)  TempSrc:  Oral  Oral  SpO2:  93% 90% 91%  Weight: 80.1 kg     Height:        Intake/Output Summary (Last 24 hours) at 09/16/2020 1120 Last data filed at 09/16/2020 1105 Gross per 24 hour  Intake 2540 ml  Output 2325 ml  Net 215 ml   Last 3 Weights 09/16/2020 09/15/2020 09/12/2020  Weight (lbs) 176 lb 9.4 oz 165 lb 155 lb  Weight (kg) 80.1 kg 74.844 kg 70.308 kg      Telemetry  Overnight telemetry shows atrial fibrillation heart rate in the 90s, which I personally reviewed.   ECG  The most recent ECG shows atrial fibrillation heart rate 57, right  bundle branch block, which I personally reviewed.   Physical Exam   Vitals:   09/16/20 0500 09/16/20 0756 09/16/20 0820 09/16/20 1104  BP:  125/78  97/64  Pulse:  95  (!) 103  Resp:  16  20  Temp:  98.4 F (36.9 C)  98 F (36.7 C)  TempSrc:  Oral  Oral  SpO2:  93% 90% 91%  Weight: 80.1 kg     Height:        Intake/Output Summary (Last 24 hours) at 09/16/2020 1120 Last data filed at 09/16/2020 1105 Gross per 24 hour  Intake 2540 ml  Output 2325 ml  Net 215 ml    Last 3 Weights 09/16/2020 09/15/2020 09/12/2020  Weight (lbs) 176 lb 9.4 oz 165 lb 155 lb  Weight (kg) 80.1 kg 74.844 kg 70.308 kg    Body mass index is 26.08 kg/m.  General: Well nourished, well developed, in no acute distress Head: Atraumatic, normal size  Eyes: PEERLA, EOMI  Neck: Supple, no JVD Endocrine: No thryomegaly Cardiac: Normal S1, S2; irregular rhythm, no murmurs rubs or gallops Lungs: Clear to auscultation bilaterally, no wheezing, rhonchi or rales  Abd: Soft, nontender, no hepatomegaly  Ext: No edema, pulses 2+, right radial pulse 2+ Musculoskeletal: No deformities, BUE and BLE strength normal and equal Skin: Warm and dry, no rashes   Neuro: Alert and oriented to person, place, time, and situation, CNII-XII grossly intact, no focal deficits  Psych: Normal mood and affect   Labs  High Sensitivity Troponin:  No results for input(s): TROPONINIHS in the last 720 hours.   Cardiac EnzymesNo results for input(s): TROPONINI in the last 168 hours. No results for input(s): TROPIPOC in the last 168 hours.  Chemistry Recent Labs  Lab 09/13/20 0018 09/13/20 1514 09/14/20 0350 09/15/20 0453 09/16/20 0238  NA 126* 125* 126* 127* 127*  K 3.7 4.0 4.0 4.1 4.9  CL 94* 93* 94* 93* 95*  CO2 '27 26 25 25 26  '$ GLUCOSE 133* 97 104* 92 140*  BUN 7* '11 10 10 13  '$ CREATININE 0.86 0.98 0.92 0.91 0.91  CALCIUM 7.9* 8.0* 8.3* 8.4* 8.3*  PROT 4.8* 4.7*  --   --   --   ALBUMIN 2.2* 2.1*  --   --   --   AST 16 15  --   --    --   ALT 11 11  --   --   --   ALKPHOS 105 93  --   --   --   BILITOT 0.6 0.7  --   --   --   GFRNONAA >60 >60 >60 >60 >60  ANIONGAP '5 6 7 9 6    '$ Hematology Recent Labs  Lab 09/14/20 2007 09/15/20 0453 09/15/20 2142 09/16/20 0238  WBC 7.2 7.9  --  12.1*  RBC 2.63* 2.90*  --  3.43*  HGB 7.5* 8.6* 10.2* 10.2*  HCT 23.3* 25.5* 31.8* 30.2*  MCV 88.6 87.9  --  88.0  MCH 28.5 29.7  --  29.7  MCHC 32.2 33.7  --  33.8  RDW 15.6* 15.4  --  15.0  PLT 173 181  --  173   BNP Recent Labs  Lab 09/13/20 2237  BNP 230.4*    DDimer No results for input(s): DDIMER in the last 168 hours.   Radiology  CARDIAC CATHETERIZATION  Result Date: 09/15/2020   Ost RCA to Prox RCA lesion is 50% stenosed.   Prox LAD lesion is 30% stenosed with 0% stenosed side branch in 1st Diag.   Prox Cx to Mid Cx lesion is 40% stenosed. 1.  Nonobstructive coronary artery disease with mild nonobstructive plaquing in the LAD, left circumflex, and moderate calcific stenosis of the RCA ostium. 2.  Low normal LVEDP Recommend: No high-grade coronary stenoses identified.  Patient with femur fracture.  He should not be at excessive risk of ischemic cardiac complications.   DG C-Arm 1-60 Min-No Report  Result Date: 09/15/2020 Fluoroscopy was utilized by the requesting physician.  No radiographic interpretation.   DG FEMUR, MIN 2 VIEWS RIGHT  Result Date: 09/15/2020 CLINICAL DATA:  Right femoral shaft fracture EXAM: RIGHT FEMUR 2 VIEWS COMPARISON:  09/12/2020 FLUOROSCOPY TIME:  Radiation Exposure Index (as provided by the fluoroscopic device): 10.86 mGy If the device does not provide the exposure index: Fluoroscopy Time:  Minute 31 seconds Number of Acquired Images:  9 FINDINGS: Initial images again demonstrate the oblique fracture in the midshaft of the right femur just below the known hip prosthesis. Subsequent cerclage wires were placed in the fragments were aligned. Fixation sideplate was then placed laterally with  multiple fixation screws. Fracture fragments are in  near anatomic alignment. IMPRESSION: ORIF of right midshaft femoral fracture. Electronically Signed   By: Inez Catalina M.D.   On: 09/15/2020 16:41   DG FEMUR PORT, MIN 2 VIEWS RIGHT  Result Date: 09/15/2020 CLINICAL DATA:  Status post ORIF midshaft fracture EXAM: RIGHT FEMUR PORTABLE 2 VIEW COMPARISON:  Intraoperative films from earlier in the same day. FINDINGS: Right hip prosthesis is again seen. New cerclage wires and fixation sideplate are noted with fracture fragments in near anatomic alignment. No soft tissue abnormality is noted. IMPRESSION: Status post ORIF right midshaft femoral fracture Electronically Signed   By: Inez Catalina M.D.   On: 09/15/2020 16:43    Cardiac Studies  TTE 09/15/2020   Ost RCA to Prox RCA lesion is 50% stenosed.   Prox LAD lesion is 30% stenosed with 0% stenosed side branch in 1st Diag.   Prox Cx to Mid Cx lesion is 40% stenosed.   1.  Nonobstructive coronary artery disease with mild nonobstructive plaquing in the LAD, left circumflex, and moderate calcific stenosis of the RCA ostium. 2.  Low normal LVEDP   Recommend: No high-grade coronary stenoses identified.  Patient with femur fracture.  He should not be at excessive risk of ischemic cardiac complications.   TTE 09/13/2020 1. Left ventricular ejection fraction, by estimation, is 40 to 45%. The  left ventricle has mildly decreased function. The left ventricle  demonstrates global hypokinesis. Left ventricular diastolic parameters are  indeterminate.   2. Right ventricular systolic function is normal. The right ventricular  size is normal. There is normal pulmonary artery systolic pressure.   3. Left atrial size was moderately dilated.   4. The mitral valve is grossly normal. Mild mitral valve regurgitation.   5. The aortic valve is calcified. Aortic valve regurgitation is not  visualized. Aortic valve sclerosis is present, with no evidence of aortic   valve stenosis.   6. The inferior vena cava is dilated in size with >50% respiratory  variability, suggesting right atrial pressure of 8 mmHg.    Patient Profile  EZREAL MARSHBURN is a 80 y.o. male with persistent atrial fibrillation, hypertension, COPD, AAA, tobacco abuse who was admitted on 09/12/2020 with mechanical fall and suffering a right periprosthetic femur fracture.  Cardiology was consulted for chest pain and preoperative assessment.  Underwent left heart catheterization which showed nonobstructive disease.  Assessment & Plan   Nonobstructive CAD -No significant blockages.  Aspirin and statin are recommended.  2.  Persistent atrial fibrillation -Restart Eliquis if deemed okay per surgery. -Holding amiodarone due to liver disease. -No flecainide due to CAD. -For now would recommend rate control.  He needs to get better from surgery. -Transition to metoprolol succinate 50 mg daily -Thyroid has been addressed by primary team -Could be considered for outpatient cardioversion versus ablation.  This will occur down the road.  3.  Nonischemic cardiomyopathy, EF 40-45% -EF has dropped.  Could be related to critical illness and hip surgery.  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  6 beers per day.  Cessation recommended. -No significant CAD on cath. -Transition to metoprolol succinate 50 mg daily. -Add losartan 12.5 mg daily -Add Aldactone tomorrow as we are able. -Add Farxiga 10 mg daily -Euvolemic on exam.  Low LVEDP at cath.  He is not volume overloaded.  4. Hypothyroidism -addressed by primary team  5. Anemia -2/2 surgery  For questions or updates, please contact [place] Please consult [email or web address] for contact info under   Time Spent with Patient: I  have spent a total of 35 minutes with patient reviewing hospital notes, telemetry, EKGs, labs and examining the patient as well as establishing an assessment and plan that was discussed with the patient.  > 50% of time  was spent in direct patient care.    Signed, Addison Naegeli. Audie Box, MD, [place]  09/16/2020 11:20 AM

## 2020-09-16 NOTE — Evaluation (Signed)
Physical Therapy Evaluation Patient Details Name: Kyle Farley MRN: [id number] DOB: 10-23-1940 Today's Date: 09/16/2020   History of Present Illness  80 yo male presents to [facility] on 8/26 with fall at home, RLE pain sustaining R displaced femur fx. Pt also sustained nondisplaced R inferior pubic ramus fx. s/p ORIF R proximal femur fx on 8/29. Pt also s/p L heart cath and coronary angiography on 8/29, showed nonobstructive CAD. PMH includes HF, smoker, COPD, HTN, GERD, HLD, HTN, PAF, RLE pinning 80 YO, ETOH abuse.  Clinical Impression   Pt presents with generalized weakness, significant RLE pain post-operatively, impaired balance with history of 25+ falls in the past year, and impaired activity tolerance. Pt to benefit from acute PT to address deficits. Pt requiring mod-max +2 assist for stand and pivotal steps to reach recliner, fatigues quickly. PT to progress mobility as tolerated, and will continue to follow acutely.      Follow Up Recommendations SNF    Equipment Recommendations  None recommended by PT    Recommendations for Other Services       Precautions / Restrictions Precautions Precautions: Fall (monitor o2 readings, decrease skin integrity) Precaution Comments: 25-30 falls in the past year Restrictions Weight Bearing Restrictions: Yes RLE Weight Bearing: Partial weight bearing RLE Partial Weight Bearing Percentage or Pounds: 50%      Mobility  Bed Mobility Overal bed mobility: Needs Assistance Bed Mobility: Rolling;Supine to Sit Rolling: Max assist;+2 for physical assistance;+2 for safety/equipment   Supine to sit: Max assist;+2 for physical assistance;+2 for safety/equipment;HOB elevated (use of bed rails)     General bed mobility comments: max assist +2 for trunk and LE management, scooting towards EOB, steadying at EOB. Verbal cuing for step-wise sequencing.    Transfers Overall transfer level: Needs assistance Equipment used: Rolling walker (2  wheeled) Transfers: Sit to/from Stand Sit to Stand: Mod assist;+2 physical assistance;+2 safety/equipment         General transfer comment: Mod +2 for power up, rise, steady, progressing hands from EOB to RW, steadying RW.  Ambulation/Gait Ambulation/Gait assistance: Mod assist;+2 physical assistance Gait Distance (Feet): 2 Feet Assistive device: Rolling walker (2 wheeled) Gait Pattern/deviations: Step-to pattern;Decreased step length - right;Antalgic;Trunk flexed Gait velocity: decr   General Gait Details: mod assist +2 to steady, guide RW, reinforce 50% PWB RLE, max cuing for sequencing.  Stairs            Wheelchair Mobility    Modified Rankin (Stroke Patients Only)       Balance Overall balance assessment: Needs assistance Sitting-balance support: Bilateral upper extremity supported;Feet supported Sitting balance-Leahy Scale: Fair Sitting balance - Comments: able to sit EOB without PT assist   Standing balance support: Bilateral upper extremity supported Standing balance-Leahy Scale: Poor Standing balance comment: reliant on external support                             Pertinent Vitals/Pain Pain Assessment: Faces Pain Score: 6  Faces Pain Scale: Hurts even more Pain Location: sx site/RLE Pain Descriptors / Indicators: Aching;Grimacing;Guarding Pain Intervention(s): Limited activity within patient's tolerance;Monitored during session;Repositioned    Home Living Family/patient expects to be discharged to:: Private residence Living Arrangements: Spouse/significant other (moved into son's home) Available Help at Discharge: Family (son) Type of Home: House Home Access: Stairs to enter Entrance Stairs-Rails: Can reach both Entrance Stairs-Number of Steps: 3 Home Layout: One level Home Equipment: Walker - 4 wheels;Other (comment);Shower seat;Grab bars - toilet;Toilet  riser;Grab bars - tub/shower (walking stick) Additional Comments: Pt reports he  was moving into son's home when he fell and information listed above is the son's house    Prior Function Level of Independence: Needs assistance   Gait / Transfers Assistance Needed: Per pt using 4WW most of the time with occaionally walking stick depending on distance  ADL's / Homemaking Assistance Needed: pt had an aide 3 x week completion of hygiene if needed and a nurse coming in 1-2 x week to assist with medication management  Comments: Pt was working on moving into the son's house due to up to 20-30 falls per pt "I would sneeze wrong and fall". Pt also reported out at the stores often required use of scottoer in stores     Hand Dominance   Dominant Hand: Right    Extremity/Trunk Assessment   Upper Extremity Assessment Upper Extremity Assessment: Defer to OT evaluation    Lower Extremity Assessment Lower Extremity Assessment: Generalized weakness;RLE deficits/detail RLE Deficits / Details: very limited hip and knee flexion ROM secondary to pain, + contraction hip flexors/extensors, knee flexors/extensors, DF/PF RLE: Unable to fully assess due to pain    Cervical / Trunk Assessment Cervical / Trunk Assessment: Kyphotic  Communication   Communication: HOH  Cognition Arousal/Alertness: Awake/alert Behavior During Therapy: WFL for tasks assessed/performed Overall Cognitive Status: Within Functional Limits for tasks assessed                                        General Comments General comments (skin integrity, edema, etc.): multiple scabbed wounds on distal UEs, + bleeding from scab on R hand and bandage applied. SpO2 87-96% on RA, DOE with wheezing breath sounds noted. HR in low 100s, + dizziness when standing with BP 106/50    Exercises     Assessment/Plan    PT Assessment Patient needs continued PT services  PT Problem List Decreased strength;Decreased mobility;Decreased activity tolerance;Decreased balance;Decreased knowledge of use of  DME;Pain;Cardiopulmonary status limiting activity;Decreased safety awareness;Decreased skin integrity;Obesity;Decreased knowledge of precautions       PT Treatment Interventions DME instruction;Therapeutic activities;Gait training;Therapeutic exercise;Patient/family education;Balance training;Functional mobility training;Neuromuscular re-education    PT Goals (Current goals can be found in the Care Plan section)  Acute Rehab PT Goals Patient Stated Goal: back to walking PT Goal Formulation: With patient Time For Goal Achievement: 09/30/20 Potential to Achieve Goals: Good    Frequency Min 3X/week   Barriers to discharge        Co-evaluation PT/OT/SLP Co-Evaluation/Treatment: Yes Reason for Co-Treatment: For patient/therapist safety;To address functional/ADL transfers PT goals addressed during session: Mobility/safety with mobility;Balance;Proper use of DME OT goals addressed during session: ADL's and self-care       AM-PAC PT "6 Clicks" Mobility  Outcome Measure Help needed turning from your back to your side while in a flat bed without using bedrails?: A Lot Help needed moving from lying on your back to sitting on the side of a flat bed without using bedrails?: A Lot Help needed moving to and from a bed to a chair (including a wheelchair)?: A Lot Help needed standing up from a chair using your arms (e.g., wheelchair or bedside chair)?: A Lot Help needed to walk in hospital room?: A Lot Help needed climbing 3-5 steps with a railing? : Total 6 Click Score: 11    End of Session Equipment Utilized During Treatment: Gait belt Activity  Tolerance: Patient limited by pain;Patient limited by fatigue Patient left: in chair;with call bell/phone within reach;with chair alarm set;with nursing/sitter in room Nurse Communication: Mobility status PT Visit Diagnosis: Other abnormalities of gait and mobility (R26.89);Muscle weakness (generalized) (M62.81);Pain Pain - Right/Left: Right Pain  - part of body: Leg    Time: 1012-1044 PT Time Calculation (min) (ACUTE ONLY): 32 min   Charges:   PT Evaluation $PT Eval Low Complexity: 1 Low          Florie Carico S, PT DPT Acute Rehabilitation Services Pager [phone]  Office [phone]   Roxine Caddy E Ruffin Pyo 09/16/2020, 11:43 AM

## 2020-09-16 NOTE — TOC Benefit Eligibility Note (Signed)
Patient Teacher, English as a foreign language completed.    The patient is currently admitted and upon discharge could be taking Farxiga 10 mg.  The current 30 day co-pay is, $88.07 due to being in Coverage Gap (donut hole).   The patient is currently admitted and upon discharge could be taking Entresto 24-26 mg.  The current 30 day co-pay is, $102.06 due to being in Coverage Gap (donut hole).   The patient is currently admitted and upon discharge could be taking Spironlactone 25 mg.  The current 30 day co-pay is, $1.46.  The patient is insured through [employer] [place] Patient Advocate Specialist [place] Team Direct Number: [phone]  Fax: [phone]

## 2020-09-16 NOTE — Care Management Important Message (Signed)
Important Message  Patient Details  Name: Kyle Farley MRN: [id number] Date of Birth: 10/31/1940   Medicare Important Message Given:  Yes     [name] 09/16/2020, 4:31 PM

## 2020-09-16 NOTE — Progress Notes (Signed)
Family Medicine Teaching Service Daily Progress Note Intern Pager: [phone]  Patient name: Kyle Farley Medical record number: [id number] Date of birth: [birth year] Age: 80 y.o. Gender: male  Primary Care Provider: Zoila Shutter, NP Consultants: Orthopedics, cardiology Code Status: Full  Pt Overview and Major Events to Date:  8/26: admitted for R inferior pubic ramus and femur fx 8/27: transfused 1u pRBC, echo, A. fib 8/28: hypotensive, stable 8:29: LHC, Right hip surgery 8/30: restarted heparin 8/31: dc heparin, start eliquis  Assessment and Plan: Kyle Farley is a 80 y/o M presenting with right femur and pelvic rami fracture now s/p repair.  PMHx significant for A. fib, COPD, HTN, GERD, alcohol abuse, hypothyroidism, BPH.  Right femur fracture s/p open reduction internal fixation  nondisplaced right inferior pubic ramus fracture Reports pain is 6/10 at this time.  Patient is medically stable for discharge to SNF at this time.  Patient states there was quite a bit of blood on the pad yesterday evening but he has not noticed anything today.  He has been working with PT to move and get to his chair.  Patient states he had physical therapy in home when he broke his hip last time and continued to do exercises on his own regularly. -Orthopedics following, appreciate recommendations -Tylenol 650 mg every 4 hours as needed -Oxycodone 2.5-5 mg every 4 hours as needed for severe pain -Lidocaine patch -PT/OT eval and treat -TOC following for SNF placement, appreciate assistance  Chest pain  A. Fib  HFrEF  CAD Patient not actively endorsing chest pain.  Echo noted LVEF 40-45% with global hypokinesis.  Left heart catheterization prior to surgery noted mild nonobstructive plaquing in the LAD, LCx, and moderate calcific stenosis of the RCA.  Transition to Eliquis today. -Cardiology following, appreciate recommendations -Continue metoprolol succinate 50 mg daily -Continue ASA 81 mg  daily and Lipitor 40 mg daily -Daily weights -Discontinue heparin, start Eliquis 5 mg twice daily  Hypotension Most recent blood pressure 105/68. -Continue losartan 12.5 mg daily and metoprolol succinate 50 mg daily -Hold aldactone, per cardiology  Anemia Patient has received 4 units pRBC total since admission.  Most recent hemoglobin was 8.8 from 8.9 last night.  There is no bleeding on exam at this time. -Monitor hemoglobin and signs of bleeding  Constipation Patient has not had bowel movement since admission.  He denies abdominal pain. Current regimen is MiraLAX daily and senna twice daily -Transition to MiraLAX twice daily, senna 2 tablets twice daily  Difficulty sleeping -Melatonin 3 mg nightly  FEN/GI: Heart healthy PPx: Eliquis Dispo:SNF tomorrow. Barriers include placement, medically stable for discharge tomorrow.   Subjective:  Patient states his pain is 6/10.  He denies any more bleeding and states he is doing much better than previously  Objective: Temp:  [97.9 F (36.6 C)-98.3 F (36.8 C)] 98.2 F (36.8 C) (08/31 0801) Pulse Rate:  [95-100] 100 (08/31 0801) Resp:  [14-20] 14 (08/31 0801) BP: (93-113)/(68-91) 105/68 (08/31 0801) SpO2:  [91 %-99 %] 99 % (08/31 0804) Physical Exam: General: Awake, alert, oriented, conversational, no acute distress Cardiovascular: RRR, no murmurs auscultated Respiratory: CTA B, nasal cannula in place on 3 L Abdomen: Soft, nontender, normoactive bowel sounds Extremities: Bandage in place and no signs of bleeding appreciated  Laboratory: Recent Labs  Lab 09/16/20 0238 09/16/20 2023 09/17/20 0138  WBC 12.1* 14.6* 11.9*  HGB 10.2* 8.9* 8.8*  HCT 30.2* 26.9* 27.0*  PLT 173 206 207   Recent Labs  Lab 09/13/20  0018 09/13/20 1514 09/14/20 0350 09/15/20 0453 09/16/20 0238 09/17/20 0138  NA 126* 125*   < > 127* 127* 128*  K 3.7 4.0   < > 4.1 4.9 4.5  CL 94* 93*   < > 93* 95* 94*  CO2 27 26   < > '25 26 28  '$ BUN 7* 11   <  > '10 13 13  '$ CREATININE 0.86 0.98   < > 0.91 0.91 0.85  CALCIUM 7.9* 8.0*   < > 8.4* 8.3* 8.5*  PROT 4.8* 4.7*  --   --   --   --   BILITOT 0.6 0.7  --   --   --   --   ALKPHOS 105 93  --   --   --   --   ALT 11 11  --   --   --   --   AST 16 15  --   --   --   --   GLUCOSE 133* 97   < > 92 140* 114*   < > = values in this interval not displayed.   Imaging/Diagnostic Tests: No new imaging/diagnostic tests at this time.  Wells Guiles, DO 09/17/2020, 11:27 AM PGY-1, [place] Intern pager: [phone], text pages welcome

## 2020-09-16 NOTE — Progress Notes (Addendum)
Orthopaedic Trauma Progress Note  SUBJECTIVE: doing fairly well this morning. Reports mild pain about operative site, notes it its better than yesterday and pre-op. No chest pain. No SOB. No nausea/vomiting. No other complaints from an orthopaedic standpoint. Noted to have a lot of drainage through dressing. Patient states that following his hip replacement, he required an incisional wound vac due to continued drainage.   OBJECTIVE:  Vitals:   09/16/20 0756 09/16/20 0820  BP: 125/78   Pulse: 95   Resp: 16   Temp: 98.4 F (36.9 C)   SpO2: 93% 90%    General: Sitting up in bed, NAD Respiratory: No increased work of breathing.  RLE: Proximal portion of dressing saturated with blood. Dressing removed and incisions itself is clean and dry currently. Incision cleaned and new dressing applied consisting of adaptic, ABDs, kerlix, ace wrap. Mildly tender through thigh as expected. Non-tender in lower leg. Ankle DF/PF intact. Sensation intact distally. +DP pulse  IMAGING: Stable post op imaging.   LABS:  Results for orders placed or performed during the hospital encounter of 09/12/20 (from the past 24 hour(s))  Prepare RBC (crossmatch)     Status: None   Collection Time: 09/15/20 12:45 PM  Result Value Ref Range   Order Confirmation      ORDER PROCESSED BY BLOOD BANK Performed at [facility], [address], [place], [place] [address]   Prepare RBC (crossmatch)     Status: None   Collection Time: 09/15/20  2:00 PM  Result Value Ref Range   Order Confirmation      ORDER PROCESSED BY BLOOD BANK Performed at [facility], [place] [address], [place], [place] [address]   Hemoglobin and hematocrit, blood     Status: Abnormal   Collection Time: 09/15/20  9:42 PM  Result Value Ref Range   Hemoglobin 10.2 (L) 13.0 - 17.0 g/dL   HCT 31.8 (L) 39.0 - 52.0 %  VITAMIN D 25 Hydroxy (Vit-D Deficiency, Fractures)     Status: Abnormal   Collection Time: 09/15/20  9:42 PM  Result Value Ref  Range   Vit D, 25-Hydroxy 114.48 (H) 30 - 100 ng/mL  Basic metabolic panel     Status: Abnormal   Collection Time: 09/16/20  2:38 AM  Result Value Ref Range   Sodium 127 (L) 135 - 145 mmol/L   Potassium 4.9 3.5 - 5.1 mmol/L   Chloride 95 (L) 98 - 111 mmol/L   CO2 26 22 - 32 mmol/L   Glucose, Bld 140 (H) 70 - 99 mg/dL   BUN 13 8 - 23 mg/dL   Creatinine, Ser 0.91 0.61 - 1.24 mg/dL   Calcium 8.3 (L) 8.9 - 10.3 mg/dL   GFR, Estimated >60 >60 mL/min   Anion gap 6 5 - 15  CBC     Status: Abnormal   Collection Time: 09/16/20  2:38 AM  Result Value Ref Range   WBC 12.1 (H) 4.0 - 10.5 K/uL   RBC 3.43 (L) 4.22 - 5.81 MIL/uL   Hemoglobin 10.2 (L) 13.0 - 17.0 g/dL   HCT 30.2 (L) 39.0 - 52.0 %   MCV 88.0 80.0 - 100.0 fL   MCH 29.7 26.0 - 34.0 pg   MCHC 33.8 30.0 - 36.0 g/dL   RDW 15.0 11.5 - 15.5 %   Platelets 173 150 - 400 K/uL   nRBC 0.0 0.0 - 0.2 %    ASSESSMENT: Kyle Farley is a 80 y.o. male, 1 Day  Post-Op s/p ORIF PERIPROSTHETIC FEMORAL SHAFT FRACTURE  CV/Blood loss: Acute blood loss anemia, Hgb 10.2 this AM. Hemodynamically stable  PLAN: Weightbearing: PWB 50%  RLE Incisional and dressing care: Reinforce dressings as needed  Showering: Hold off for now Orthopedic device(s): None  Pain management:  1. Tylenol 650 mg q 4 hours PRN 2. Lidoderm 5% patch q 12 hours 3. Oxycodone 2.5-5 mg q 4 hours PRN 4. Morphine 0.5- 1 mg q 2 hours PRN VTE prophylaxis:  Ok to restart Heparin today. Plan to transition back to Eliquis tomorrow per primary team.  SCDs ID:  Ancef 2gm post op Foley/Lines: Foley in place, remove once able. KVO IVFs Impediments to Fracture Healing: Vit D level 114, no supplementation needed Dispo: PT/OT eval today. Continue to monitor RLE incision, reinforce dressing as needed.   Follow - up plan: 2 weeks after d/c for repeat x-rays and wound check  Contact information:  Katha Hamming MD, Patrecia Pace PA-C. After hours and holidays please check [email or web address] for group  call information for [facility]   [name], PA-C [phone] (office) [email or web address]

## 2020-09-17 DIAGNOSIS — I5021 Acute systolic (congestive) heart failure: Secondary | ICD-10-CM | POA: Diagnosis not present

## 2020-09-17 DIAGNOSIS — S7291XA Unspecified fracture of right femur, initial encounter for closed fracture: Secondary | ICD-10-CM | POA: Diagnosis not present

## 2020-09-17 DIAGNOSIS — I480 Paroxysmal atrial fibrillation: Secondary | ICD-10-CM | POA: Diagnosis not present

## 2020-09-17 LAB — CBC
HCT: 27 % — ABNORMAL LOW (ref 39.0–52.0)
Hemoglobin: 8.8 g/dL — ABNORMAL LOW (ref 13.0–17.0)
MCH: 28.9 pg (ref 26.0–34.0)
MCHC: 32.6 g/dL (ref 30.0–36.0)
MCV: 88.8 fL (ref 80.0–100.0)
Platelets: 207 10*3/uL (ref 150–400)
RBC: 3.04 MIL/uL — ABNORMAL LOW (ref 4.22–5.81)
RDW: 14.8 % (ref 11.5–15.5)
WBC: 11.9 10*3/uL — ABNORMAL HIGH (ref 4.0–10.5)
nRBC: 0 % (ref 0.0–0.2)

## 2020-09-17 LAB — BASIC METABOLIC PANEL
Anion gap: 6 (ref 5–15)
BUN: 13 mg/dL (ref 8–23)
CO2: 28 mmol/L (ref 22–32)
Calcium: 8.5 mg/dL — ABNORMAL LOW (ref 8.9–10.3)
Chloride: 94 mmol/L — ABNORMAL LOW (ref 98–111)
Creatinine, Ser: 0.85 mg/dL (ref 0.61–1.24)
GFR, Estimated: >60 mL/min (ref >60)
Glucose, Bld: 114 mg/dL — ABNORMAL HIGH (ref 70–99)
Potassium: 4.5 mmol/L (ref 3.5–5.1)
Sodium: 128 mmol/L — ABNORMAL LOW (ref 135–145)

## 2020-09-17 MED ORDER — APIXABAN 5 MG PO TABS
5.0000 mg | ORAL_TABLET | Freq: Two times a day (BID) | ORAL | Status: DC
  Administered 2020-09-17 – 2020-09-20 (×7): 5 mg via ORAL
  Filled 2020-09-17 (×7): qty 1

## 2020-09-17 MED ORDER — SODIUM CHLORIDE 0.9% FLUSH
3.0000 mL | Freq: Two times a day (BID) | INTRAVENOUS | Status: DC
  Administered 2020-09-17 – 2020-09-19 (×5): 3 mL via INTRAVENOUS

## 2020-09-17 MED ORDER — POLYETHYLENE GLYCOL 3350 17 G PO PACK
17.0000 g | PACK | Freq: Two times a day (BID) | ORAL | Status: DC
  Administered 2020-09-17 – 2020-09-20 (×6): 17 g via ORAL
  Filled 2020-09-17 (×7): qty 1

## 2020-09-17 MED ORDER — SODIUM CHLORIDE 0.9% FLUSH
3.0000 mL | Freq: Two times a day (BID) | INTRAVENOUS | Status: DC
  Administered 2020-09-17 – 2020-09-20 (×6): 3 mL via INTRAVENOUS

## 2020-09-17 MED ORDER — SENNA 8.6 MG PO TABS
2.0000 | ORAL_TABLET | Freq: Two times a day (BID) | ORAL | Status: DC
  Administered 2020-09-17 – 2020-09-20 (×7): 17.2 mg via ORAL
  Filled 2020-09-17 (×7): qty 2

## 2020-09-17 MED ORDER — CALCIUM CARBONATE ANTACID 500 MG PO CHEW
1.0000 | CHEWABLE_TABLET | Freq: Every day | ORAL | Status: DC
  Administered 2020-09-17 – 2020-09-20 (×4): 200 mg via ORAL
  Filled 2020-09-17 (×4): qty 1

## 2020-09-17 NOTE — Progress Notes (Signed)
FPTS Brief Progress Note  S Saw patient at bedside this evening. Patient was resting comfortably. Denies pain. Would like something to help him sleep. Per RN was already given melatonin. No concerns from night RN.  O: BP 106/69 (BP Location: Left Arm)   Pulse (!) 102   Temp 98.4 F (36.9 C) (Axillary)   Resp 15   Ht '5\' 9"'$  (1.753 m)   Wt 80.1 kg   SpO2 95%   BMI 26.08 kg/m    General: Alert, no acute distress, comfortable   A/P: Plan per day team  -CBC and BMP am - Orders reviewed. Labs for AM ordered, which was adjusted as needed.   Lattie Haw, MD 09/17/2020, 8:56 PM PGY-3, [place] Night Resident  Please page [phone] with questions.

## 2020-09-17 NOTE — Progress Notes (Addendum)
8 wk follow-up arranged with Dr. Bettina Gavia, appt placed on chart. Also included some post cath instructions on AVS DC Instructions but made sure to mention that any stricter/longer restrictions from the orthopedic team regarding certain activities (I.e. lifting, bathing) will take precedence over our restrictions.

## 2020-09-17 NOTE — Progress Notes (Signed)
Cardiology Progress Note  Patient ID: Kyle Farley MRN: [id number] DOB: January 02, 1941 Date of Encounter: 09/17/2020  Primary Cardiologist: Shirlee More, MD  Subjective   Chief Complaint: None.  HPI: Doing well at night.  Reports he is tired.  Has not slept well since being here.  Denies any chest pain.  ROS:  All other ROS reviewed and negative. Pertinent positives noted in the HPI.     Inpatient Medications  Scheduled Meds:  acetaminophen  650 mg Oral Q4H   aspirin EC  81 mg Oral Daily   atorvastatin  40 mg Oral Daily   dapagliflozin propanediol  10 mg Oral Daily   feeding supplement  237 mL Oral BID BM   fluticasone furoate-vilanterol  1 puff Inhalation Daily   heparin  5,000 Units Subcutaneous Q8H   levothyroxine  50 mcg Oral QAC breakfast   lidocaine  1 patch Transdermal Q24H   losartan  12.5 mg Oral Daily   melatonin  3 mg Oral QHS   metoprolol succinate  50 mg Oral Daily   multivitamin with minerals  1 tablet Oral Daily   pantoprazole  40 mg Oral Daily   polyethylene glycol  17 g Oral BID   senna  2 tablet Oral BID   tamsulosin  0.4 mg Oral Daily   umeclidinium bromide  1 puff Inhalation Daily   Continuous Infusions:  sodium chloride     sodium chloride     PRN Meds: albuterol, oxyCODONE   Vital Signs   Vitals:   09/16/20 2257 09/17/20 0259 09/17/20 0801 09/17/20 0804  BP: 111/69 93/68 105/68   Pulse: 98 100 100   Resp: '20 16 14   '$ Temp: 97.9 F (36.6 C) 97.9 F (36.6 C) 98.2 F (36.8 C)   TempSrc: Oral Oral Oral   SpO2: 95% 99% 98% 99%  Weight:      Height:        Intake/Output Summary (Last 24 hours) at 09/17/2020 1029 Last data filed at 09/17/2020 0801 Gross per 24 hour  Intake 450 ml  Output 2975 ml  Net -2525 ml   Last 3 Weights 09/16/2020 09/15/2020 09/12/2020  Weight (lbs) 176 lb 9.4 oz 165 lb 155 lb  Weight (kg) 80.1 kg 74.844 kg 70.308 kg      Telemetry  Overnight telemetry shows sinus rhythm in the 90s, which I personally reviewed.    Physical Exam   Vitals:   09/16/20 2257 09/17/20 0259 09/17/20 0801 09/17/20 0804  BP: 111/69 93/68 105/68   Pulse: 98 100 100   Resp: '20 16 14   '$ Temp: 97.9 F (36.6 C) 97.9 F (36.6 C) 98.2 F (36.8 C)   TempSrc: Oral Oral Oral   SpO2: 95% 99% 98% 99%  Weight:      Height:        Intake/Output Summary (Last 24 hours) at 09/17/2020 1029 Last data filed at 09/17/2020 0801 Gross per 24 hour  Intake 450 ml  Output 2975 ml  Net -2525 ml    Last 3 Weights 09/16/2020 09/15/2020 09/12/2020  Weight (lbs) 176 lb 9.4 oz 165 lb 155 lb  Weight (kg) 80.1 kg 74.844 kg 70.308 kg    Body mass index is 26.08 kg/m.  General: Well nourished, well developed, in no acute distress Head: Atraumatic, normal size  Eyes: PEERLA, EOMI  Neck: Supple, no JVD Endocrine: No thryomegaly Cardiac: Normal S1, S2; RRR; no murmurs, rubs, or gallops Lungs: Clear to auscultation bilaterally, no wheezing, rhonchi or rales  Abd: Soft, nontender, no hepatomegaly  Ext: No edema, pulses 2+ Musculoskeletal: No deformities, BUE and BLE strength normal and equal Skin: Warm and dry, no rashes   Neuro: Alert and oriented to person, place, time, and situation, CNII-XII grossly intact, no focal deficits  Psych: Normal mood and affect   Labs  High Sensitivity Troponin:  No results for input(s): TROPONINIHS in the last 720 hours.   Cardiac EnzymesNo results for input(s): TROPONINI in the last 168 hours. No results for input(s): TROPIPOC in the last 168 hours.  Chemistry Recent Labs  Lab 09/13/20 0018 09/13/20 1514 09/14/20 0350 09/15/20 0453 09/16/20 0238 09/17/20 0138  NA 126* 125*   < > 127* 127* 128*  K 3.7 4.0   < > 4.1 4.9 4.5  CL 94* 93*   < > 93* 95* 94*  CO2 27 26   < > '25 26 28  '$ GLUCOSE 133* 97   < > 92 140* 114*  BUN 7* 11   < > '10 13 13  '$ CREATININE 0.86 0.98   < > 0.91 0.91 0.85  CALCIUM 7.9* 8.0*   < > 8.4* 8.3* 8.5*  PROT 4.8* 4.7*  --   --   --   --   ALBUMIN 2.2* 2.1*  --   --   --   --    AST 16 15  --   --   --   --   ALT 11 11  --   --   --   --   ALKPHOS 105 93  --   --   --   --   BILITOT 0.6 0.7  --   --   --   --   GFRNONAA >60 >60   < > >60 >60 >60  ANIONGAP 5 6   < > '9 6 6   '$ < > = values in this interval not displayed.    Hematology Recent Labs  Lab 09/16/20 0238 09/16/20 2023 09/17/20 0138  WBC 12.1* 14.6* 11.9*  RBC 3.43* 3.03* 3.04*  HGB 10.2* 8.9* 8.8*  HCT 30.2* 26.9* 27.0*  MCV 88.0 88.8 88.8  MCH 29.7 29.4 28.9  MCHC 33.8 33.1 32.6  RDW 15.0 14.7 14.8  PLT 173 206 207   BNP Recent Labs  Lab 09/13/20 2237  BNP 230.4*    DDimer No results for input(s): DDIMER in the last 168 hours.   Radiology  DG C-Arm 1-60 Min-No Report  Result Date: 09/15/2020 Fluoroscopy was utilized by the requesting physician.  No radiographic interpretation.   DG FEMUR, MIN 2 VIEWS RIGHT  Result Date: 09/15/2020 CLINICAL DATA:  Right femoral shaft fracture EXAM: RIGHT FEMUR 2 VIEWS COMPARISON:  09/12/2020 FLUOROSCOPY TIME:  Radiation Exposure Index (as provided by the fluoroscopic device): 10.86 mGy If the device does not provide the exposure index: Fluoroscopy Time:  Minute 31 seconds Number of Acquired Images:  9 FINDINGS: Initial images again demonstrate the oblique fracture in the midshaft of the right femur just below the known hip prosthesis. Subsequent cerclage wires were placed in the fragments were aligned. Fixation sideplate was then placed laterally with multiple fixation screws. Fracture fragments are in near anatomic alignment. IMPRESSION: ORIF of right midshaft femoral fracture. Electronically Signed   By: Inez Catalina M.D.   On: 09/15/2020 16:41   DG FEMUR PORT, MIN 2 VIEWS RIGHT  Result Date: 09/15/2020 CLINICAL DATA:  Status post ORIF midshaft fracture EXAM: RIGHT FEMUR PORTABLE 2 VIEW COMPARISON:  Intraoperative films from  earlier in the same day. FINDINGS: Right hip prosthesis is again seen. New cerclage wires and fixation sideplate are noted with  fracture fragments in near anatomic alignment. No soft tissue abnormality is noted. IMPRESSION: Status post ORIF right midshaft femoral fracture Electronically Signed   By: Inez Catalina M.D.   On: 09/15/2020 16:43    Cardiac Studies  LHC 09/15/2020   Ost RCA to Prox RCA lesion is 50% stenosed.   Prox LAD lesion is 30% stenosed with 0% stenosed side branch in 1st Diag.   Prox Cx to Mid Cx lesion is 40% stenosed.   1.  Nonobstructive coronary artery disease with mild nonobstructive plaquing in the LAD, left circumflex, and moderate calcific stenosis of the RCA ostium. 2.  Low normal LVEDP   Recommend: No high-grade coronary stenoses identified.  Patient with femur fracture.  He should not be at excessive risk of ischemic cardiac complications.  TTE 09/13/2020  1. Left ventricular ejection fraction, by estimation, is 40 to 45%. The  left ventricle has mildly decreased function. The left ventricle  demonstrates global hypokinesis. Left ventricular diastolic parameters are  indeterminate.   2. Right ventricular systolic function is normal. The right ventricular  size is normal. There is normal pulmonary artery systolic pressure.   3. Left atrial size was moderately dilated.   4. The mitral valve is grossly normal. Mild mitral valve regurgitation.   5. The aortic valve is calcified. Aortic valve regurgitation is not  visualized. Aortic valve sclerosis is present, with no evidence of aortic  valve stenosis.   6. The inferior vena cava is dilated in size with >50% respiratory  variability, suggesting right atrial pressure of 8 mmHg.   Patient Profile  DAIDEN KADAR is a 80 y.o. male with persistent atrial fibrillation, hypertension, COPD, AAA, tobacco abuse who was admitted on 09/12/2020 with mechanical fall and suffering a right periprosthetic femur fracture.  Cardiology was consulted for chest pain and preoperative assessment.  Underwent left heart catheterization which showed nonobstructive  disease.  Assessment & Plan   #Nonobstructive CAD -Aspirin and statin  #Persistent atrial fibrillation -Seems to be back in sinus rhythm. -Continue metoprolol succinate 50 mg daily for rate control. -Hold amiodarone due to lung disease.  No flecainide due to CAD. -Could consider outpatient cardiac ablation.  This can be discussed at outpatient visit. -Would restart home Eliquis.  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 -Continue metoprolol succinate 50 mg daily.  Losartan 12.5 mg daily added.  He is on Farxiga 10 mg daily. -BP is soft.  Would hold on Aldactone for now.  He is still recovering from surgery.  This can be added in the outpatient setting. -No diuresis needed.  #Hypothyroidism -Addressed by primary team.  [facility] will sign off.   Medication Recommendations: Continue medications as above Other recommendations (labs, testing, etc): None Follow up as an outpatient: We will arrange hospital follow-up with Dr. Bettina Gavia in 6 to 8 weeks For questions or updates, please contact [facility] Please consult [email or web address] for contact info under   Time Spent with Patient: I have spent a total of 25 minutes with patient reviewing hospital notes, telemetry, EKGs, labs and examining the patient as well as establishing an assessment and plan that was discussed with the patient.  > 50% of time was spent in direct patient care.    Signed, Addison Naegeli. Audie Box, MD, [facility]  09/17/2020 10:29 AM

## 2020-09-17 NOTE — Progress Notes (Signed)
BP 87/54 this am, held Metoprolol and Losartan.  Current BP 87/61.  Text page sent to [facility] to advise.

## 2020-09-17 NOTE — Discharge Instructions (Addendum)
Post-Cardiac Cath Instructions - These instructions are specific to your heart catheterization so if you were given stricter regarding your orthopedic procedure, make sure to follow those. No lifting over 5 lbs for 1 week after your heart catheterization. No sexual activity for 1 week after your heart catheterization. Keep procedure site clean & dry. If you notice increased pain, swelling, bleeding or pus at the cath site, call the cardiology office or return to the emergency department. Follow the orthopedic instructions for bathing, but from a heart cath site standpoint we do not want you submerging in a bathtub or hot tub for at least a week.    Orthopaedic Trauma Service Discharge Instructions   General Discharge Instructions  WEIGHT BEARING STATUS: 50% partial weightbearing right lower extremity  RANGE OF MOTION/ACTIVITY: Ok for hip and knee motion as tolerated  Wound Care: You may remove your surgical dressings. Incisions can be left open to air if there is no drainage. If incision continues to have drainage, follow wound care instructions below. Okay to shower if no drainage from incisions. DO NOT SUBMERGE INCISIONS, ALLOW SOAP AND WATER TO RUN OVER THEM   DVT/PE prophylaxis:  Eliquis  Diet: as you were eating previously.  Can use over the counter stool softeners and bowel preparations, such as Miralax, to help with bowel movements.  Narcotics can be constipating.  Be sure to drink plenty of fluids  PAIN MEDICATION USE AND EXPECTATIONS  You have likely been given narcotic medications to help control your pain.  After a traumatic event that results in an fracture (broken bone) with or without surgery, it is ok to use narcotic pain medications to help control one's pain.  We understand that everyone responds to pain differently and each individual patient will be evaluated on a regular basis for the continued need for narcotic medications. Ideally, narcotic medication use should last no more  than 6-8 weeks (coinciding with fracture healing).   As a patient it is your responsibility as well to monitor narcotic medication use and report the amount and frequency you use these medications when you come to your office visit.   We would also advise that if you are using narcotic medications, you should take a dose prior to therapy to maximize you participation.  IF YOU ARE ON NARCOTIC MEDICATIONS IT IS NOT PERMISSIBLE TO OPERATE A MOTOR VEHICLE (MOTORCYCLE/CAR/TRUCK/MOPED) OR HEAVY MACHINERY DO NOT MIX NARCOTICS WITH OTHER CNS (CENTRAL NERVOUS SYSTEM) DEPRESSANTS SUCH AS ALCOHOL   STOP SMOKING OR USING NICOTINE PRODUCTS!!!!  As discussed nicotine severely impairs your body's ability to heal surgical and traumatic wounds but also impairs bone healing.  Wounds and bone heal by forming microscopic blood vessels (angiogenesis) and nicotine is a vasoconstrictor (essentially, shrinks blood vessels).  Therefore, if vasoconstriction occurs to these microscopic blood vessels they essentially disappear and are unable to deliver necessary nutrients to the healing tissue.  This is one modifiable factor that you can do to dramatically increase your chances of healing your injury.    (This means no smoking, no nicotine gum, patches, etc)  DO NOT USE NONSTEROIDAL ANTI-INFLAMMATORY DRUGS (NSAID'S)  Using products such as Advil (ibuprofen), Aleve (naproxen), Motrin (ibuprofen) for additional pain control during fracture healing can delay and/or prevent the healing response.  If you would like to take over the counter (OTC) medication, Tylenol (acetaminophen) is ok.  However, some narcotic medications that are given for pain control contain acetaminophen as well. Therefore, you should not exceed more than 4000 mg of  tylenol in a day if you do not have liver disease.  Also note that there are may OTC medicines, such as cold medicines and allergy medicines that my contain tylenol as well.  If you have any questions  about medications and/or interactions please ask your doctor/PA or your pharmacist.      ICE AND ELEVATE INJURED/OPERATIVE EXTREMITY  Using ice and elevating the injured extremity above your heart can help with swelling and pain control.  Icing in a pulsatile fashion, such as 20 minutes on and 20 minutes off, can be followed.    Do not place ice directly on skin. Make sure there is a barrier between to skin and the ice pack.    Using frozen items such as frozen peas works well as the conform nicely to the are that needs to be iced.  USE AN ACE WRAP OR TED HOSE FOR SWELLING CONTROL  In addition to icing and elevation, Ace wraps or TED hose are used to help limit and resolve swelling.  It is recommended to use Ace wraps or TED hose until you are informed to stop.    When using Ace Wraps start the wrapping distally (farthest away from the body) and wrap proximally (closer to the body)   Example: If you had surgery on your leg or thing and you do not have a splint on, start the ace wrap at the toes and work your way up to the thigh        If you had surgery on your upper extremity and do not have a splint on, start the ace wrap at your fingers and work your way up to the upper arm   [place]: [phone]   VISIT OUR WEBSITE FOR ADDITIONAL INFORMATION: [email or web address]    Discharge Wound Care Instructions  Do NOT apply any ointments, solutions or lotions to pin sites or surgical wounds.  These prevent needed drainage and even though solutions like hydrogen peroxide kill bacteria, they also damage cells lining the pin sites that help fight infection.  Applying lotions or ointments can keep the wounds moist and can cause them to breakdown and open up as well. This can increase the risk for infection. When in doubt call the office.  If any drainage is noted, use one layer of adaptic, then gauze, Kerlix, and an ace wrap.  Once the incision is completely dry  and without drainage, it may be left open to air out.  Showering may begin 36-48 hours later.  Cleaning gently with soap and water.   Primary Care Instructions:  Thank you for letting us care for you during your stay.  You were admitted to the [facility] Medicine Teaching Service.   You were admitted for hip fracture.  We found the following during your stay hip fracture, low blood count, low sodium, and irregular heartbeat.   Follow Up:  We recommend follow up specifically for repeat blood work at your primary care doctors in one week. Follow up with cardiology for them to take care of your irregular heartbeat, you have a follow up scheduled with Dr. Bettina Gavia on 11/8.  Follow up with ortho for repeat x-rays of your hip in two weeks.   Other important diagnoses identified during your stay were low blood counts, irregular heartbeat.  Please follow up with your primary care physician in 1 week.   If your symptoms worsen or return, please return to the hospital. If you begin to notice excessive  bleeding, get lightheadedness, dizziness, chest pain, palpitations, shortness of breath, edema, severe leg pain, or if you have another fall please come back to the hospital   Please let us know if you have questions about your stay at [facility].

## 2020-09-17 NOTE — Progress Notes (Signed)
Orthopaedic Trauma Progress Note  SUBJECTIVE: Doing fairly well this morning, does note some soreness in the hip and throughout the right leg overall pain controlled.  Was able to get up to bedside chair with therapies yesterday but does note this is uncomfortable.  No chest pain. No SOB. No nausea/vomiting.  No issues with continued incisional drainage over the last 24 hours.    OBJECTIVE:  Vitals:   09/17/20 0801 09/17/20 0804  BP: 105/68   Pulse: 100   Resp: 14   Temp: 98.2 F (36.8 C)   SpO2: 98% 99%    General: Sitting up in bed, NAD Respiratory: No increased work of breathing.  RLE: Dressing remains clean, dry, intact.  Mildly tender through thigh as expected. Non-tender in lower leg. Ankle DF/PF intact. Sensation intact distally. +DP pulse  IMAGING: Stable post op imaging.   LABS:  Results for orders placed or performed during the hospital encounter of 09/12/20 (from the past 24 hour(s))  CBC     Status: Abnormal   Collection Time: 09/16/20  8:23 PM  Result Value Ref Range   WBC 14.6 (H) 4.0 - 10.5 K/uL   RBC 3.03 (L) 4.22 - 5.81 MIL/uL   Hemoglobin 8.9 (L) 13.0 - 17.0 g/dL   HCT 26.9 (L) 39.0 - 52.0 %   MCV 88.8 80.0 - 100.0 fL   MCH 29.4 26.0 - 34.0 pg   MCHC 33.1 30.0 - 36.0 g/dL   RDW 14.7 11.5 - 15.5 %   Platelets 206 150 - 400 K/uL   nRBC 0.0 0.0 - 0.2 %  Basic metabolic panel     Status: Abnormal   Collection Time: 09/17/20  1:38 AM  Result Value Ref Range   Sodium 128 (L) 135 - 145 mmol/L   Potassium 4.5 3.5 - 5.1 mmol/L   Chloride 94 (L) 98 - 111 mmol/L   CO2 28 22 - 32 mmol/L   Glucose, Bld 114 (H) 70 - 99 mg/dL   BUN 13 8 - 23 mg/dL   Creatinine, Ser 0.85 0.61 - 1.24 mg/dL   Calcium 8.5 (L) 8.9 - 10.3 mg/dL   GFR, Estimated >60 >60 mL/min   Anion gap 6 5 - 15  CBC     Status: Abnormal   Collection Time: 09/17/20  1:38 AM  Result Value Ref Range   WBC 11.9 (H) 4.0 - 10.5 K/uL   RBC 3.04 (L) 4.22 - 5.81 MIL/uL   Hemoglobin 8.8 (L) 13.0 - 17.0 g/dL    HCT 27.0 (L) 39.0 - 52.0 %   MCV 88.8 80.0 - 100.0 fL   MCH 28.9 26.0 - 34.0 pg   MCHC 32.6 30.0 - 36.0 g/dL   RDW 14.8 11.5 - 15.5 %   Platelets 207 150 - 400 K/uL   nRBC 0.0 0.0 - 0.2 %    ASSESSMENT: Kyle Farley is a 80 y.o. male, 2 Days Post-Op s/p ORIF PERIPROSTHETIC FEMORAL SHAFT FRACTURE  CV/Blood loss: Acute blood loss anemia, Hgb 8.8 this morning. Hemodynamically stable  PLAN: Weightbearing: PWB 50%  RLE Incisional and dressing care: Reinforce dressings as needed.  We will plan to remove/change tomorrow Showering: Hold off on showering for now until incision is reevaluated.  Okay for bed bath Orthopedic device(s): None  Pain management:  1. Tylenol 650 mg q 4 hours PRN 2. Lidoderm 5% patch q 12 hours 3. Oxycodone 2.5-5 mg q 4 hours PRN 4. Morphine 0.5- 1 mg q 2 hours PRN VTE prophylaxis:  Okay to restart Eliquis from orthopedic standpoint once cleared by primary team.  Continue aspirin 81 mg.  SCDs ID:  Ancef 2gm post op completed Foley/Lines: No foley. KVO IVFs Impediments to Fracture Healing: Vit D level 114, no supplementation needed Dispo: Therapies as tolerated, PT/OT recommending SNF. Continue to monitor CBC Follow - up plan: 2 weeks after d/c for repeat x-rays and wound check  Contact information:  Katha Hamming MD, Patrecia Pace PA-C. After hours and holidays please check [email or web address] for group call information for [facility]   Kyle Brazzel A. Ricci Barker, PA-C [phone] (office) [email or web address]

## 2020-09-18 DIAGNOSIS — S7291XA Unspecified fracture of right femur, initial encounter for closed fracture: Secondary | ICD-10-CM | POA: Diagnosis not present

## 2020-09-18 DIAGNOSIS — S7291XD Unspecified fracture of right femur, subsequent encounter for closed fracture with routine healing: Secondary | ICD-10-CM | POA: Diagnosis not present

## 2020-09-18 DIAGNOSIS — J449 Chronic obstructive pulmonary disease, unspecified: Secondary | ICD-10-CM | POA: Diagnosis not present

## 2020-09-18 LAB — CBC
HCT: 25.6 % — ABNORMAL LOW (ref 39.0–52.0)
Hemoglobin: 8.3 g/dL — ABNORMAL LOW (ref 13.0–17.0)
MCH: 29 pg (ref 26.0–34.0)
MCHC: 32.4 g/dL (ref 30.0–36.0)
MCV: 89.5 fL (ref 80.0–100.0)
Platelets: 234 10*3/uL (ref 150–400)
RBC: 2.86 MIL/uL — ABNORMAL LOW (ref 4.22–5.81)
RDW: 15 % (ref 11.5–15.5)
WBC: 9.5 10*3/uL (ref 4.0–10.5)
nRBC: 0 % (ref 0.0–0.2)

## 2020-09-18 LAB — BASIC METABOLIC PANEL
Anion gap: 7 (ref 5–15)
BUN: 12 mg/dL (ref 8–23)
CO2: 29 mmol/L (ref 22–32)
Calcium: 8.3 mg/dL — ABNORMAL LOW (ref 8.9–10.3)
Chloride: 93 mmol/L — ABNORMAL LOW (ref 98–111)
Creatinine, Ser: 0.8 mg/dL (ref 0.61–1.24)
GFR, Estimated: >60 mL/min (ref >60)
Glucose, Bld: 94 mg/dL (ref 70–99)
Potassium: 4.1 mmol/L (ref 3.5–5.1)
Sodium: 129 mmol/L — ABNORMAL LOW (ref 135–145)

## 2020-09-18 MED ORDER — LACTULOSE 10 GM/15ML PO SOLN
10.0000 g | Freq: Once | ORAL | Status: AC
  Administered 2020-09-18: 10 g via ORAL
  Filled 2020-09-18: qty 15

## 2020-09-18 NOTE — TOC Initial Note (Signed)
Transition of Care [facility]) - Initial/Assessment Note    Patient Details  Name: Kyle Farley MRN: [id number] Date of Birth: Aug 22, 1940  Transition of Care [facility]) CM/SW Contact:    Emeterio Reeve, LCSW Phone Number: 09/18/2020, 2:33 PM  Clinical Narrative:                  CSW received SNF consult. CSW met with pt at bedside. CSW introduced self and explained role at the hospital. Pt reports that PTA he lived at home alone. Pt reports he was independent with mobility and ADL's.   CSW reviewed PT/OT recommendations for SNF. Pt reports he is ok with going to SNF. Pt gave CSW permission to fax out to facilities in the area. Pt reports he wants to go to SNF in [place] and wants to go to a facility that allows him to smoke cigarettes. CSW gave pt medicare.gov rating list to review. CSW explained insurance auth process. Pt reports they are covid vaccinated.  CSW will continue to follow.   Expected Discharge Plan: Skilled Nursing Facility Barriers to Discharge: Continued Medical Work up, Ship broker   Patient Goals and CMS Choice Patient states their goals for this hospitalization and ongoing recovery are:: to get better CMS Medicare.gov Compare Post Acute Care list provided to:: Patient Choice offered to / list presented to : Patient  Expected Discharge Plan and Services Expected Discharge Plan: [place]       Living arrangements for the past 2 months: Single Family Home                                      Prior Living Arrangements/Services Living arrangements for the past 2 months: Single Family Home Lives with:: Self Patient language and need for interpreter reviewed:: Yes Do you feel safe going back to the place where you live?: Yes      Need for Family Participation in Patient Care: Yes (Comment)     Criminal Activity/Legal Involvement Pertinent to Current Situation/Hospitalization: No - Comment as needed  Activities of Daily  Living Home Assistive Devices/Equipment: Cane (specify quad or straight) ADL Screening (condition at time of admission) Patient's cognitive ability adequate to safely complete daily activities?: Yes Is the patient deaf or have difficulty hearing?: No Does the patient have difficulty seeing, even when wearing glasses/contacts?: Yes Does the patient have difficulty concentrating, remembering, or making decisions?: No Patient able to express need for assistance with ADLs?: Yes Does the patient have difficulty dressing or bathing?: No Independently performs ADLs?: Yes (appropriate for developmental age) Does the patient have difficulty walking or climbing stairs?: No Weakness of Legs: Both Weakness of Arms/Hands: None  Permission Sought/Granted Permission sought to share information with : Chartered certified accountant granted to share information with : Yes, Verbal Permission Granted     Permission granted to share info w AGENCY: SNF        Emotional Assessment Appearance:: Appears stated age Attitude/Demeanor/Rapport: Engaged Affect (typically observed): Appropriate Orientation: : Oriented to Place, Oriented to Self, Oriented to  Time, Oriented to Situation Alcohol / Substance Use: Not Applicable Psych Involvement: No (comment)  Admission diagnosis:  COPD (chronic obstructive pulmonary disease) ([place]) [J44.9] Fracture [T14.8XXA] Femur fracture, right ([place]) [S72.91XA] Closed fracture of right femur ([place]) [S72.91XA] Closed fracture of right femur, unspecified fracture morphology, unspecified portion of femur, initial encounter [facility]) [S72.91XA] Patient Active Problem List  Diagnosis Date Noted   Pressure injury of skin 09/16/2020   Femur fracture, right ([place]) 09/12/2020   Alcohol use 09/12/2020   Hypertension    History of kidney stones    GERD (gastroesophageal reflux disease)    Dysrhythmia    Dyspnea    COPD (chronic obstructive pulmonary disease) (HCC)     Arthritis    COPD exacerbation ([place]) 01/24/2017   Kidney stones 01/24/2017   Hyperlipidemia 01/24/2017   CHF (congestive heart failure) ([place]) 01/24/2017   Paroxysmal atrial fibrillation ([place]) 10/21/2014   Essential hypertension 10/21/2014   Cigarette smoker 10/21/2014   PCP:  Zoila Shutter, NP Pharmacy:   [facility] [address] [place] [address] [place] [address] Phone: [phone] Fax: [phone]     Social Determinants of Health (SDOH) Interventions    Readmission Risk Interventions No flowsheet data found.  Emeterio Reeve, LCSW Clinical Social Worker

## 2020-09-18 NOTE — NC FL2 (Signed)
[place] MEDICAID FL2 LEVEL OF CARE SCREENING TOOL     IDENTIFICATION  Patient Name: Kyle Farley Birthdate: 1940/01/23 Sex: male Admission Date (Current Location): 09/12/2020  [facility] [place] Number:  Herbalist and Address:  [facility] [place] [address], [place], [place] [address]      Provider Number: [id number]  Attending Physician Name and Address:  Lenoria Chime, MD  Relative Name and Phone Number:       Current Level of Care:   Recommended Level of Care: [place] Prior Approval Number:    Date Approved/Denied:   PASRR Number: [id number]  Discharge Plan: SNF    Current Diagnoses: Patient Active Problem List   Diagnosis Date Noted   Pressure injury of skin 09/16/2020   Femur fracture, right ([place]) 09/12/2020   Alcohol use 09/12/2020   Hypertension    History of kidney stones    GERD (gastroesophageal reflux disease)    Dysrhythmia    Dyspnea    COPD (chronic obstructive pulmonary disease) (HCC)    Arthritis    COPD exacerbation ([place]) 01/24/2017   Kidney stones 01/24/2017   Hyperlipidemia 01/24/2017   CHF (congestive heart failure) ([place]) 01/24/2017   Paroxysmal atrial fibrillation ([place]) 10/21/2014   Essential hypertension 10/21/2014   Cigarette smoker 10/21/2014    Orientation RESPIRATION BLADDER Height & Weight     Time, Self, Situation, Place  Normal Continent Weight: 176 lb 9.4 oz (80.1 kg) Height:  '5\' 9"'$  (175.3 cm)  BEHAVIORAL SYMPTOMS/MOOD NEUROLOGICAL BOWEL NUTRITION STATUS      Continent Diet  AMBULATORY STATUS COMMUNICATION OF NEEDS Skin   Limited Assist Verbally Normal                       Personal Care Assistance Level of Assistance  Bathing, Feeding, Dressing Bathing Assistance: Limited assistance Feeding assistance: Independent Dressing Assistance: Limited assistance     Functional Limitations Info  Sight, Hearing, Speech Sight Info: Adequate Hearing Info:  Adequate Speech Info: Adequate    SPECIAL CARE FACTORS FREQUENCY  PT (By licensed PT), OT (By licensed OT)     PT Frequency: 5x a week OT Frequency: 5x a week            Contractures Contractures Info: Not present    Additional Factors Info  Code Status, Allergies Code Status Info: Full Allergies Info: NKA           Current Medications (09/18/2020):  This is the current hospital active medication list Current Facility-Administered Medications  Medication Dose Route Frequency Provider Last Rate Last Admin   acetaminophen (TYLENOL) tablet 650 mg  650 mg Oral Q4H Simmons-Robinson, Makiera, MD   650 mg at 09/18/20 0913   albuterol ([name]) (2.5 MG/3ML) 0.083% nebulizer solution 3 mL  3 mL Inhalation Q4H PRN Delray Alt, PA-C   3 mL at 09/16/20 2007   apixaban (ELIQUIS) tablet 5 mg  5 mg Oral BID Martyn Malay, MD   5 mg at 09/18/20 [id number]   aspirin EC tablet 81 mg  81 mg Oral Daily Delray Alt, PA-C   81 mg at 09/18/20 0914   atorvastatin (LIPITOR) tablet 40 mg  40 mg Oral Daily Patrecia Pace A, PA-C   40 mg at 09/18/20 [id number]   calcium carbonate (TUMS - dosed in mg elemental calcium) chewable tablet 200 mg of elemental calcium  1 tablet Oral Daily Martyn Malay, MD   200 mg  of elemental calcium at 09/18/20 0913   dapagliflozin propanediol (FARXIGA) tablet 10 mg  10 mg Oral Daily Geralynn Rile, MD   10 mg at 09/18/20 0912   feeding supplement (ENSURE ENLIVE / ENSURE PLUS) liquid 237 mL  237 mL Oral BID BM Patrecia Pace A, PA-C   237 mL at 09/18/20 1300   fluticasone furoate-vilanterol (BREO ELLIPTA) 100-25 MCG/INH 1 puff  1 puff Inhalation Daily Delray Alt, PA-C   1 puff at 09/18/20 [id number]   levothyroxine (SYNTHROID) tablet 50 mcg  50 mcg Oral QAC breakfast Delray Alt, PA-C   50 mcg at 09/18/20 0553   lidocaine (LIDODERM) 5 % 1 patch  1 patch Transdermal Q24H Delray Alt, PA-C   1 patch at 09/17/20 2042   losartan (COZAAR) tablet 12.5 mg  12.5 mg Oral  Daily Geralynn Rile, MD   12.5 mg at 09/18/20 0912   melatonin tablet 3 mg  3 mg Oral QHS Simmons-Robinson, Makiera, MD   3 mg at 09/17/20 2215   metoprolol succinate (TOPROL-XL) 24 hr tablet 50 mg  50 mg Oral Daily Geralynn Rile, MD   50 mg at 09/18/20 [id number]   multivitamin with minerals tablet 1 tablet  1 tablet Oral Daily Delray Alt, PA-C   1 tablet at 09/18/20 [id number]   oxyCODONE (Oxy IR/ROXICODONE) immediate release tablet 2.5-5 mg  2.5-5 mg Oral Q4H PRN Delray Alt, PA-C   5 mg at 09/16/20 0831   pantoprazole (PROTONIX) EC tablet 40 mg  40 mg Oral Daily Patrecia Pace A, PA-C   40 mg at 09/18/20 0913   polyethylene glycol (MIRALAX / GLYCOLAX) packet 17 g  17 g Oral BID Sharion Settler, DO   17 g at 09/18/20 0911   senna (SENOKOT) tablet 17.2 mg  2 tablet Oral BID Sharion Settler, DO   17.2 mg at 09/18/20 0913   sodium chloride flush (NS) 0.9 % injection 3 mL  3 mL Intravenous Q12H Martyn Malay, MD   3 mL at 09/18/20 0913   sodium chloride flush (NS) 0.9 % injection 3 mL  3 mL Intravenous Q12H Martyn Malay, MD   3 mL at 09/18/20 0913   tamsulosin (FLOMAX) capsule 0.4 mg  0.4 mg Oral Daily Delray Alt, PA-C   0.4 mg at 09/18/20 [id number]   umeclidinium bromide (INCRUSE ELLIPTA) 62.5 MCG/INH 1 puff  1 puff Inhalation Daily Delray Alt, PA-C   1 puff at 09/18/20 [id number]     Discharge Medications: Please see discharge summary for a list of discharge medications.  Relevant Imaging Results:  Relevant Lab Results:   Additional Information SSN [id number]  Emeterio Reeve, LCSW

## 2020-09-18 NOTE — Progress Notes (Signed)
Family Medicine Teaching Service Daily Progress Note Intern Pager: [phone]  Patient name: Kyle Farley Medical record number: [id number] Date of birth: 09-06-1940 Age: 80 y.o. Gender: male  Primary Care Provider: Zoila Shutter, NP Consultants: Cardiology Code Status: Full  Pt Overview and Major Events to Date:  Kyle Farley is a 80 year old male presenting with right femur and pelvic rami fracture now status postrepair. Past medical history significant for A. fib, COPD, HTN, GERD, alcohol abuse, hypothyroidism, BPH  Assessment and Plan:  Right femur fracture s/p open reduction internal fixation  nondisplaced right inferior pubic ramus fracture Patient is medically stable for discharge to SNF. On physical exam right leg was bandaged, patient able to lift leg just off the bed. He has no complaints of pain at this time.  -Orthopedics following, appreciate recommendations -Tylenol 650 mg every 4 hours as needed  -Oxycodone 2.5-5 mg every 4 hours as needed for severe pain  -Lidocaine patch  -PT/OT eval and treat -TOC following for SNF placement appreciate assistance  Chest pain  A. Fib  HFrEF  CAD Patient not actively endorsing chest pain, dyspnea, or fatigue -Cardiology following, appreciate recommendations -Continue metoprolol succinate 50 mg daily (Held yesterday) -Daily weights -Continue ASA 81 mg daily, Lipitor 40 mg daily, Lorsartan 12.5 mg daily (Held yesterday), Farxiga 10 mg daily -Continue Eliquis '5mg'$  twice daily -Restart Aldactone in outpatient setting   Hypotension Most recent blood pressure 105/73 -Continue losartan 12.5 mg daily and metoprolol succinate 50 mg daily -Hold aldactone, per cardiology  Anemia Patient has received 4 units of pRBC since admission. Most recent hemoglobin is stable at 8.3 from 8.8 yesterday.  -Check Hgb in am -Monitor hemoglobin and for sings of bleeding  Constipation Patient has/has not had a bowel movement since admission.  He denies abdominal pain, -Current bowel regimen is Miralax twice daily and senna 2 tablets twice daily  -Trial of lactulose 10 g   Difficulty Sleeping -Melatonin 3 mg nightly   FEN/GI: Heart Healthy PPx: [place] Dispo:SNF today. Barriers include Placement, medically stable.   Subjective:  Patient feeling well this morning, ate breakfast and has a polite affect. Denies any pain, still has not had a bowel movement.  Objective: Temp:  [97.6 F (36.4 C)-98.7 F (37.1 C)] 97.8 F (36.6 C) (09/01 0729) Pulse Rate:  [99-106] 105 (09/01 0729) Resp:  [14-20] 16 (09/01 0729) BP: (87-116)/(61-75) 105/73 (09/01 0729) SpO2:  [95 %-99 %] 95 % (09/01 0729) Physical Exam: General: Well appearing, good affect Cardiovascular: RRR, [place] Respiratory: CTABL, no wheezes or stridor Abdomen: Soft, non-tender, non-distended Extremities: Pulses intact, no LE edema  Laboratory: Recent Labs  Lab 09/16/20 2023 09/17/20 0138 09/18/20 0416  WBC 14.6* 11.9* 9.5  HGB 8.9* 8.8* 8.3*  HCT 26.9* 27.0* 25.6*  PLT 206 207 234   Recent Labs  Lab 09/13/20 0018 09/13/20 1514 09/14/20 0350 09/16/20 0238 09/17/20 0138 09/18/20 0416  NA 126* 125*   < > 127* 128* 129*  K 3.7 4.0   < > 4.9 4.5 4.1  CL 94* 93*   < > 95* 94* 93*  CO2 27 26   < > '26 28 29  '$ BUN 7* 11   < > '13 13 12  '$ CREATININE 0.86 0.98   < > 0.91 0.85 0.80  CALCIUM 7.9* 8.0*   < > 8.3* 8.5* 8.3*  PROT 4.8* 4.7*  --   --   --   --   BILITOT 0.6 0.7  --   --   --   --  ALKPHOS 105 93  --   --   --   --   ALT 11 11  --   --   --   --   AST 16 15  --   --   --   --   GLUCOSE 133* 97   < > 140* 114* 94   < > = values in this interval not displayed.      Imaging/Diagnostic Tests: No new imaging  Holley Bouche, MD 09/18/2020, 7:47 AM PGY-1, [place] Intern pager: [phone], text pages welcome

## 2020-09-18 NOTE — Progress Notes (Signed)
Physical Therapy Treatment Patient Details Name: Kyle Farley MRN: [id number] DOB: 29-Jul-1940 Today's Date: 09/18/2020    History of Present Illness 80 yo male presents [facility] on 8/[facility] with fall at home, RLE pain sustaining R displaced femur fx. Pt also sustained nondisplaced R inferior pubic ramus fx. s/p ORIF R proximal femur fx on 8/29. Pt also s/p L heart cath and coronary angiography on 8/29, showed nonobstructive CAD. PMH includes HF, smoker, COPD, HTN, GERD, HLD, HTN, PAF, RLE pinning 80 YO, ETOH abuse.    PT Comments    Pt progressing well with mobility, ambulatory around room with use of RW and min assist overall. PT reinforcing WB precautions throughout mobilty, pt tolerated session well. Will continue to follow.    Follow Up Recommendations  SNF     Equipment Recommendations  None recommended by PT    Recommendations for Other Services       Precautions / Restrictions Precautions Precautions: Fall Restrictions Weight Bearing Restrictions: Yes RLE Weight Bearing: Partial weight bearing RLE Partial Weight Bearing Percentage or Pounds: 50%    Mobility  Bed Mobility Overal bed mobility: Needs Assistance Bed Mobility: Supine to Sit     Supine to sit: Min assist;HOB elevated     General bed mobility comments: Min assist for progression of LEs to EOB, pt using bedrails and HOB elevation to bring trunk off of bed.    Transfers Overall transfer level: Needs assistance Equipment used: Rolling walker (2 wheeled) Transfers: Sit to/from Stand Sit to Stand: Min assist         General transfer comment: Min assist for initial rise and steady, cues for hand placement.  Ambulation/Gait Ambulation/Gait assistance: Min assist Gait Distance (Feet): 30 Feet Assistive device: Rolling walker (2 wheeled) Gait Pattern/deviations: Step-through pattern;Decreased stride length;Trunk flexed Gait velocity: decr   General Gait Details: min assist to steady, assist pt in room  navigation. Cues for PWB RLE via pushing through UEs   Stairs             Wheelchair Mobility    Modified Rankin (Stroke Patients Only)       Balance Overall balance assessment: Needs assistance Sitting-balance support: Bilateral upper extremity supported;Feet supported Sitting balance-Leahy Scale: Fair Sitting balance - Comments: able to sit EOB without PT assist   Standing balance support: Bilateral upper extremity supported Standing balance-Leahy Scale: Poor Standing balance comment: reliant on external support                            Cognition Arousal/Alertness: Awake/alert Behavior During Therapy: WFL for tasks assessed/performed Overall Cognitive Status: Within Functional Limits for tasks assessed                                        Exercises General Exercises - Lower Extremity Heel Slides: AAROM;Right;10 reps;Supine    General Comments General comments (skin integrity, edema, etc.): HRmax 114 bpm, SPO2 88% on RA, 2LO2 applied at end of session      Pertinent Vitals/Pain Pain Assessment: Faces Faces Pain Scale: Hurts little more Pain Location: sx site/RLE Pain Descriptors / Indicators: Aching;Grimacing;Guarding Pain Intervention(s): Limited activity within patient's tolerance;Monitored during session    Home Living                      Prior Function  PT Goals (current goals can now be found in the care plan section) Acute Rehab PT Goals Patient Stated Goal: back to walking PT Goal Formulation: With patient Time For Goal Achievement: 09/30/20 Potential to Achieve Goals: Good Progress towards PT goals: Progressing toward goals    Frequency    Min 3X/week      PT Plan Current plan remains appropriate    Co-evaluation              AM-PAC PT "6 Clicks" Mobility   Outcome Measure  Help needed turning from your back to your side while in a flat bed without using bedrails?: A  Little Help needed moving from lying on your back to sitting on the side of a flat bed without using bedrails?: A Little Help needed moving to and from a bed to a chair (including a wheelchair)?: A Little Help needed standing up from a chair using your arms (e.g., wheelchair or bedside chair)?: A Little Help needed to walk in hospital room?: A Little Help needed climbing 3-5 steps with a railing? : A Lot 6 Click Score: 17    End of Session Equipment Utilized During Treatment: Gait belt Activity Tolerance: Patient tolerated treatment well Patient left: in chair;with call bell/phone within reach;with chair alarm set Nurse Communication: Mobility status PT Visit Diagnosis: Other abnormalities of gait and mobility (R26.89);Muscle weakness (generalized) (M62.81);Pain Pain - Right/Left: Right Pain - part of body: Leg     Time: [id number] PT Time Calculation (min) (ACUTE ONLY): 18 min  Charges:  $Gait Training: 8-22 mins                     Stacie Glaze, PT DPT Acute Rehabilitation Services Pager [phone]  Office [phone]    [place] 09/18/2020, 10:00 AM

## 2020-09-18 NOTE — Progress Notes (Signed)
FPTS Interim Progress Note  S: Patient resting comfortable in bed. Sinus rhythm on cardiac monitoring, rate controlled. Not complaining of lightheadedness. Has had softer BP's tonight but on recheck was 117/60s. Tachycardic to low 100s.  O: BP (!) 98/59   Pulse (!) 102   Temp (!) 97.4 F (36.3 C) (Oral)   Resp 18   Ht '5\' 9"'$  (1.753 m)   Wt 80.1 kg   SpO2 90%   BMI 26.08 kg/m    General: well appearing, laying in bed watching TV Lungs: CTAB, no wheezes crackles or rales Heart: Mildly tachycardic to low 100s, no m/r/g Abdomen: non-distended Skin: no bleeding or bruising visualized  A/P: Plan per day team -will repeat Hgb tomorrow AM -metoprolol was held yesterday AM for hypotension and restarted today.  -Will monitor HR and BPs and day team to consider d/c of Farxiga if hypotension sustained   Gerrit Heck, MD 09/18/2020, 9:42 PM PGY-1, [place] Medicine Service pager [phone]

## 2020-09-18 NOTE — Progress Notes (Signed)
Orthopaedic Trauma Progress Note  SUBJECTIVE: Doing fairly well this morning, pain in right leg manageable.  Worked with therapies this morning, was able to get up to bedside chair and ambulate through room.  No chest pain. No SOB. No nausea/vomiting.   OBJECTIVE:  Vitals:   09/18/20 0400 09/18/20 0729  BP:  105/73  Pulse: (!) 105 (!) 105  Resp: 19 16  Temp:  97.8 F (36.6 C)  SpO2: 99% 95%    General: Sitting up in bed, NAD Respiratory: No increased work of breathing.  RLE: Dressing changed, incision with very minimal amount of bleeding. New Mepilex dressing applied. Mildly tender through thigh as expected. Non-tender in lower leg. Ankle DF/PF intact. Sensation intact distally. +DP pulse  IMAGING: Stable post op imaging.   LABS:  Results for orders placed or performed during the hospital encounter of 09/12/20 (from the past 24 hour(s))  Basic metabolic panel     Status: Abnormal   Collection Time: 09/18/20  4:16 AM  Result Value Ref Range   Sodium 129 (L) 135 - 145 mmol/L   Potassium 4.1 3.5 - 5.1 mmol/L   Chloride 93 (L) 98 - 111 mmol/L   CO2 29 22 - 32 mmol/L   Glucose, Bld 94 70 - 99 mg/dL   BUN 12 8 - 23 mg/dL   Creatinine, Ser 0.80 0.61 - 1.24 mg/dL   Calcium 8.3 (L) 8.9 - 10.3 mg/dL   GFR, Estimated >60 >60 mL/min   Anion gap 7 5 - 15  CBC     Status: Abnormal   Collection Time: 09/18/20  4:16 AM  Result Value Ref Range   WBC 9.5 4.0 - 10.5 K/uL   RBC 2.86 (L) 4.22 - 5.81 MIL/uL   Hemoglobin 8.3 (L) 13.0 - 17.0 g/dL   HCT 25.6 (L) 39.0 - 52.0 %   MCV 89.5 80.0 - 100.0 fL   MCH 29.0 26.0 - 34.0 pg   MCHC 32.4 30.0 - 36.0 g/dL   RDW 15.0 11.5 - 15.5 %   Platelets 234 150 - 400 K/uL   nRBC 0.0 0.0 - 0.2 %    ASSESSMENT: Kyle Farley is a 80 y.o. male, 3 Days Post-Op s/p ORIF PERIPROSTHETIC FEMORAL SHAFT FRACTURE  CV/Blood loss: Acute blood loss anemia, Hgb 8.3 this morning.   PLAN: Weightbearing: PWB 50%  RLE Incisional and dressing care: Change  PRN Showering: Ok to shower, keep RLE incision covered Orthopedic device(s): None  Pain management:  1. Tylenol 650 mg q 4 hours PRN 2. Lidoderm 5% patch q 12 hours 3. Oxycodone 2.5-5 mg q 4 hours PRN VTE prophylaxis:  Eliquis 5 gm BID, Aspirin 81 mg.  SCDs ID:  Ancef 2gm post op completed Foley/Lines: No foley. KVO IVFs Impediments to Fracture Healing: Vit D level 114, no supplementation needed Dispo: Therapies as tolerated, PT/OT recommending SNF. Patient ok for d/c from ortho standpoint once cleared by medicine team and therapies Follow - up plan: Will continue to follow while in hospital, plan for outpatient follow-up 2 weeks after d/c for repeat x-rays and wound check  Contact information:  Katha Hamming MD, Patrecia Pace PA-C. After hours and holidays please check [email or web address] for group call information for [facility]   Chetara Kropp A. Ricci Barker, PA-C [phone] (office) [email or web address]

## 2020-09-18 NOTE — Progress Notes (Signed)
Occupational Therapy Treatment Patient Details Name: Kyle Farley MRN: [id number] DOB: 1940/05/27 Today's Date: 09/18/2020    History of present illness 80 yo male presents to [facility] on 8/26 with fall at home, RLE pain sustaining R displaced femur fx. Pt also sustained nondisplaced R inferior pubic ramus fx. s/p ORIF R proximal femur fx on 8/29. Pt also s/p L heart cath and coronary angiography on 8/29, showed nonobstructive CAD. PMH includes HF, smoker, COPD, HTN, GERD, HLD, HTN, PAF, RLE pinning 80 YO, ETOH abuse.   OT comments  Pt with excellent willingness and participation, demo's increased tolerance and overall functional performance from initial eval per chart review. Continues to be limited by maintaining WB status, discomfort/pain, as well as decreased overall activity tolerance and strength. Improved to requiring min-mod for functional transfers and sustained min A for standing tasks. Limited in seated/standing LB ADLs this date. Persistently reporting his preference is for d/c to home, but with education appears agreeable to post acute at SNF level close to home. Pt endorsing recent admit to [facility] from previous fall resulting in fx hip. Strong recommendations for d/c to SNF with 24hr S/A prior to return to home with son to decrease risk for further falls and rehospitalization. OT will continue to follow acutely.    Follow Up Recommendations  SNF;Supervision/Assistance - 24 hour    Equipment Recommendations   (TBD)    Recommendations for Other Services      Precautions / Restrictions Precautions Precautions: Fall Precaution Comments: 25-30 falls in the past year Restrictions Weight Bearing Restrictions: Yes RLE Weight Bearing: Partial weight bearing RLE Partial Weight Bearing Percentage or Pounds: 50%       Mobility Bed Mobility Overal bed mobility: Needs Assistance Bed Mobility: Supine to Sit     Supine to sit: Min assist;HOB elevated     General bed  mobility comments: recieved in recliner    Transfers Overall transfer level: Needs assistance Equipment used: Rolling walker (2 wheeled) Transfers: Sit to/from [facility] Sit to Stand: Min assist         General transfer comment: min A x3 efforts for transition to and from standing with cues for hand placement and technique to decrease discomfort., improved on 3rd effort to no VC but consistent min A required.    Balance Overall balance assessment: Needs assistance Sitting-balance support: Bilateral upper extremity supported;Feet supported Sitting balance-Leahy Scale: Fair Sitting balance - Comments: able to sit EOB without PT assist   Standing balance support: Bilateral upper extremity supported Standing balance-Leahy Scale: Poor Standing balance comment: reliant on external support                           ADL either performed or assessed with clinical judgement   ADL                   Upper Body Dressing : Set up;Sitting   Lower Body Dressing: Moderate assistance;Sit to/from stand   Toilet Transfer: Minimal assistance;RW   Toileting- Clothing Manipulation and Hygiene: Moderate assistance;Sit to/from stand         General ADL Comments: observed with fluctuation in O2 level with 2L donned during session, dropping to min 80's with exertion, improving with seated rest break back to above 90     Vision       Perception     Praxis      Cognition Arousal/Alertness: Awake/alert Behavior During Therapy: [facility] for tasks assessed/performed  Overall Cognitive Status: Within Functional Limits for tasks assessed                                          Exercises General Exercises - Lower Extremity Heel Slides: AAROM;Right;10 reps;Supine   Shoulder Instructions       General Comments HRmax 114 bpm, SPO2 88% on RA, 2LO2 applied at end of session    Pertinent Vitals/ Pain       Pain Assessment: 0-10 Pain Score: 3   Faces Pain Scale: Hurts little more Pain Location: R LE/incision Pain Descriptors / Indicators: Aching;Grimacing;Guarding Pain Intervention(s): Limited activity within patient's tolerance;Monitored during session  Home Living                                          Prior Functioning/Environment              Frequency  Min 2X/week        Progress Toward Goals  OT Goals(current goals can now be found in the care plan section)     Acute Rehab OT Goals Patient Stated Goal: "to be able to go home and walk my dog" Time For Goal Achievement: 10/04/20 Potential to Achieve Goals: Good  Plan Discharge plan remains appropriate    Co-evaluation                 AM-PAC OT "6 Clicks" Daily Activity     Outcome Measure   Help from another person eating meals?: None Help from another person taking care of personal grooming?: A Little Help from another person toileting, which includes using toliet, bedpan, or urinal?: A Lot Help from another person bathing (including washing, rinsing, drying)?: A Lot Help from another person to put on and taking off regular upper body clothing?: A Little Help from another person to put on and taking off regular lower body clothing?: A Lot 6 Click Score: 16    End of Session Equipment Utilized During Treatment: Gait belt;Rolling walker      Activity Tolerance Patient limited by fatigue;Other (comment)   Patient Left in chair;with call bell/phone within reach;with chair alarm set   Nurse Communication       Time: [phone] OT Time Calculation (min): 24 min  Charges: OT General Charges $OT Visit: 1 Visit OT Treatments $Self Care/Home Management : 23-37 mins  Kyle [facility]ley OTR/L acute rehab services Office: [phone]  09/18/2020, 12:17 PM

## 2020-09-19 LAB — CBC
HCT: 24.4 % — ABNORMAL LOW (ref 39.0–52.0)
Hemoglobin: 8 g/dL — ABNORMAL LOW (ref 13.0–17.0)
MCH: 29.4 pg (ref 26.0–34.0)
MCHC: 32.8 g/dL (ref 30.0–36.0)
MCV: 89.7 fL (ref 80.0–100.0)
Platelets: 236 10*3/uL (ref 150–400)
RBC: 2.72 MIL/uL — ABNORMAL LOW (ref 4.22–5.81)
RDW: 15.1 % (ref 11.5–15.5)
WBC: 9 10*3/uL (ref 4.0–10.5)
nRBC: 0 % (ref 0.0–0.2)

## 2020-09-19 LAB — PREPARE RBC (CROSSMATCH)

## 2020-09-19 LAB — HEMOGLOBIN AND HEMATOCRIT, BLOOD
HCT: 24.1 % — ABNORMAL LOW (ref 39.0–52.0)
Hemoglobin: 7.8 g/dL — ABNORMAL LOW (ref 13.0–17.0)

## 2020-09-19 MED ORDER — SODIUM CHLORIDE 0.9% IV SOLUTION: Freq: Once | INTRAVENOUS | Status: DC

## 2020-09-19 MED ORDER — OXYCODONE HCL 5 MG PO TABS: 5.0000 mg | ORAL_TABLET | ORAL | 0 refills | Status: DC | PRN

## 2020-09-19 MED ORDER — ENSURE ENLIVE PO LIQD
237.0000 mL | Freq: Three times a day (TID) | ORAL | Status: DC
  Administered 2020-09-19 – 2020-09-20 (×4): 237 mL via ORAL

## 2020-09-19 NOTE — Discharge Summary (Addendum)
[facility] Discharge Summary  Patient name: Kyle Farley Medical record number: [id number] Date of birth: 1940/09/16 Age: 80 y.o. Gender: male Date of Admission: 09/12/2020  Date of Discharge: 09/19/20 Admitting Physician: Eulis Foster, MD  Primary Care Provider: Zoila Shutter, NP Consultants: cardiology, orthopedics   Indication for Hospitalization: right femur and pubic ramis fx s/p ORIF  Discharge Diagnoses/Problem List:  Principal Problem:   Femur fracture, right [facility]) Active Problems:   Paroxysmal atrial fibrillation ([place])   Essential hypertension   Cigarette smoker   CHF (congestive heart failure) ([place])   COPD (chronic obstructive pulmonary disease) ([place])   Alcohol use   Pressure injury of skin   Disposition: Home  Discharge Condition: Stable   Discharge Exam:  Blood pressure 127/80, pulse 87, temperature 98.4 F (36.9 C), temperature source Oral, resp. rate 20, height '5\' 9"'$  (1.753 m), weight 77.4 kg, SpO2 99 %. Physical Exam Constitutional:      Appearance: Normal appearance.  Cardiovascular:     Rate and Rhythm: Regular rhythm. Tachycardia present.     Heart sounds: No murmur heard.   No friction rub. No gallop.  Pulmonary:     Effort: Pulmonary effort is normal.     Breath sounds: Normal breath sounds.  Abdominal:     General: Bowel sounds are normal. There is no distension.     Palpations: Abdomen is soft.     Tenderness: There is no abdominal tenderness.  Musculoskeletal:     Comments: Right hip incisions covered with dressing and clean and dry.   Skin:    General: Skin is warm and dry.     Findings: No bruising.  Neurological:     Mental Status: He is alert.  Psychiatric:        Mood and Affect: Mood normal.        Behavior: Behavior normal  Exam performed by Dr. Zigmund Daniel.   Brief Hospital Course:  Kyle Farley is a 80 year old with a history of A. fib on Eliquis, COPD, alcohol use, prior hip  fracture s/p repair who was admitted to the [facility] at [facility] for right periprosthetic femur fracture and nondisplaced right inferior pubic ramus fracture s/p fall. His hospital course is detailed below:  Right Femur and Pelvic Fracture  Patient suffered a fall and sustained a fracture to femur at the site of his prior prosthesis and inferior pubic ramus fracture.  Patient's hemoglobin dropped steadily throughout hospital course to the point of requiring 2 units pRBC.  No frank bleeding was noted near fracture location.  He was optimized for the OR and underwent ORIF of his right proximal femur fracutre on 8/29. Had an estimated 900 mL blood loss and received 2 additional units PRBC after surgery.  Post operatively, pain was controlled with tylenol, oxycodone, and lidocaine patch.  Patient has good mobility at end of admission and has been working well with PT, SNF recommended but patient maintains capacity and preferred to be discharged home instead which family also agreed with.   Anemia  Presence of anemia on admission and pt lost 900 mL of blood during procedure. He received 5 U prbs total during his stay and never exhibited symptoms. Final Hgb was stable at 9.4>9. Follow up in 1 week.   Atrial fibrillation on Eliquis Upon discontinuation of Eliquis in preparation of procedure, patient developed A. fib.  Twelve-lead EKG showed A. fib with SVR with RBBB.  Followed by cardiology.  TSH elevated,  Synthroid increased from 25 to 50 mg.  Amiodarone and flecainide discontinued.  Echo showed mild LV dysfunction with EF 40-45% with global hypokinesis.  Cardiology opted to proceed with left heart catheterization prior to surgery.  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 Cardiology recommended holding amiodarone, no flecainide, possibly starting aldactone outpatient, possibly continue with cardiac ablation outpatient. Rate has been well controlled prior to  discharge. Follow up with cardiology outpatient.   Hyponatremia Patient was notably hyponatremic upon presentation at 128, previously 80-year and a half ago.  Osmolality of 288 classifying patient has isotonic hyponatremia.  Hyponatremia did not improve with fluids.  Likely caused by active hypothyroidism and inappropriate ADH release in the setting of pain.  Patient continued to have excellent urine output. Chronic hyponatremia that has remained unchanged throughout admission.    PCP follow-up recommendations Recommend bisphosphonate therapy in future, 6-8 weeks after discharge Repeat CBC and BMP in 1 week Consider addition of spironolactone and Farxiga at discharge -per cardiology Consider losartan transition to [facility] at discharge -per cardiology Synthroid dose increased from 25 mcg to 50 mcg as TSH was elevated. Recheck TSH in 4-6 weeks.  Follow up with Dr. Bettina Gavia on 11/8, already arranged Ortho follow up in 2 weeks for repeat x-rays   Significant Procedures: Left heart cath, S/P ORIF  Significant Labs and Imaging:  Recent Labs  Lab 09/18/20 0416 09/19/20 0237 09/19/20 1446 09/20/20 0741 09/20/20 1322  WBC 9.5 9.0  --  10.4  --   HGB 8.3* 8.0* 7.8* 9.4* 9.0*  HCT 25.6* 24.4* 24.1* 28.5* 27.8*  PLT 234 236  --  273  --    Recent Labs  Lab 09/14/20 0350 09/15/20 0453 09/16/20 0238 09/17/20 0138 09/18/20 0416  NA 126* 127* 127* 128* 129*  K 4.0 4.1 4.9 4.5 4.1  CL 94* 93* 95* 94* 93*  CO2 '25 25 26 28 29  '$ GLUCOSE 104* 92 140* 114* 94  BUN '10 10 13 13 12  '$ CREATININE 0.92 0.91 0.91 0.85 0.80  CALCIUM 8.3* 8.4* 8.3* 8.5* 8.3*   EKG 09/18/20: Same as old with new onset sinus tachycardia  EKG 09/12/20: Irregular rate and rhythm without PR waves, prolonged QRS interval, RSR' wave in V1 and V3 c/w right bundle branch block   CT Femur 09/12/20 Right w/o Contrast: Periprosthetic fracture along the right femoral stem, with nondisplaced component proximally extending through  the lesser trochanter, and a severely displaced component distal to the femoral stem with foreshortening.   DG Femur 2 views 09/15/20 ORIF of right midshaft femoral fracture   CT Pelvis w/o contrast 09/12/20: Partially visualized periprosthetic fracture along the right femoral stem, which extends through the lesser trochanter. See separately dictated right femur CT for further description. Nondisplaced right inferior pubic ramus fracture.   CT Head w/o contrast: no acute intracranial abnormality  Echo 09/13/20: Left ventricular ejection fraction, by estimation, is 40  to 45%. The left ventricle has mildly decreased function. The left  ventricle demonstrates global hypokinesis. The left ventricular internal  cavity size was normal in size. There is   no left ventricular hypertrophy. Left ventricular diastolic parameters  are indeterminate.   Cardiac Cath 09/15/20: 1.  Nonobstructive coronary artery disease with mild nonobstructive plaquing in the LAD, left circumflex, and moderate calcific stenosis of the RCA ostium. 2.  Low normal LVEDP      Results/Tests Pending at Time of Discharge: None   Discharge Medications:  Allergies as of 09/20/2020  No Known Allergies      Medication List     STOP taking these medications    amiodarone 200 MG tablet Commonly known as: PACERONE   diltiazem 60 MG tablet Commonly known as: CARDIZEM   flecainide 50 MG tablet Commonly known as: TAMBOCOR   furosemide 20 MG tablet Commonly known as: LASIX   ibuprofen 200 MG tablet Commonly known as: ADVIL   lisinopril 5 MG tablet Commonly known as: ZESTRIL   metoprolol tartrate 25 MG tablet Commonly known as: LOPRESSOR   NYQUIL PO   omeprazole 40 MG capsule Commonly known as: PRILOSEC   potassium chloride SA 20 MEQ tablet Commonly known as: KLOR-CON       TAKE these medications    acetaminophen 325 MG tablet Commonly known as: TYLENOL Take 2 tablets (650 mg total) by mouth every  4 (four) hours.   albuterol 108 (90 Base) MCG/ACT inhaler Commonly known as: VENTOLIN HFA Inhale 2 puffs into the lungs every 4 (four) hours as needed for wheezing or shortness of breath.   apixaban 5 MG Tabs tablet Commonly known as: Eliquis Take 1 tablet (5 mg total) by mouth 2 (two) times daily.   aspirin 81 MG EC tablet Take 1 tablet (81 mg total) by mouth daily. Swallow whole.   atorvastatin 40 MG tablet Commonly known as: LIPITOR Take 40 mg by mouth daily.   levothyroxine 50 MCG tablet Commonly known as: SYNTHROID Take 1 tablet (50 mcg total) by mouth daily before breakfast. What changed:  medication strength how much to take when to take this Another medication with the same name was removed. Continue taking this medication, and follow the directions you see here.   metoprolol succinate 50 MG 24 hr tablet Commonly known as: TOPROL-XL Take 1 tablet (50 mg total) by mouth daily. Take with or immediately following a meal. What changed:  medication strength how much to take when to take this additional instructions   oxyCODONE 5 MG immediate release tablet Commonly known as: Oxy IR/ROXICODONE Take 1 tablet (5 mg total) by mouth every 4 (four) hours as needed for severe pain.   pantoprazole 40 MG tablet Commonly known as: PROTONIX Take 1 tablet by mouth daily.   tamsulosin 0.4 MG Caps capsule Commonly known as: FLOMAX Take 0.4 mg by mouth daily.   Vitamin D (Ergocalciferol) 1.25 MG (50000 UNIT) Caps capsule Commonly known as: DRISDOL Take 50,000 Units by mouth 3 (three) times a week.        Discharge Instructions: Please refer to Patient Instructions section of EMR for full details.  Patient was counseled important signs and symptoms that should prompt return to medical care, changes in medications, dietary instructions, activity restrictions, and follow up appointments.   Follow-Up Appointments:  Follow-up Information     Richardo Priest, MD Follow up.    Specialties: Cardiology, Radiology Why: [facility] - follow-up has been arranged on Tuesday Nov 25, 2020 at 8:00 AM (Arrive by 7:45 AM). Contact information: [address] [place] [address] [phone]         Shona Needles, MD. Schedule an appointment as soon as possible for a visit in 2 week(s).   Specialty: Orthopedic Surgery Why: for repeat x-rays and suture removal Contact information: [place] [address] [phone]                 Erskine Emery, MD 09/20/2020, 3:19 PM PGY-1, [facility]  I was personally  present and performed or re-performed the history, physical exam and medical decision making activities of this service and have verified that the service and findings are accurately documented in the resident's note.  Donney Dice, DO                  09/20/2020, 7:34 PM  PGY-2, [place]

## 2020-09-19 NOTE — Progress Notes (Addendum)
Family Medicine Teaching Service Daily Progress Note Intern Pager: [phone]  Patient name: Kyle Farley Medical record number: [id number] Date of birth: [birth year] Age: 80 y.o. Gender: male  Primary Care Provider: Zoila Shutter, NP Consultants: Cardiology (s/o), ortho (s/o) Code Status: Full  Pt Overview and Major Events to Date:  8/26: Admitted to [place] for right inferior pubic ramis and femur fracture  8/27: Tranfused a total of 4 U pRBCs 8/28: Hypotensive but stable  8/29: LHC, right hip surgery  8/30: Restarted heparin  8/31: D/c heparin and start eliquis  9/2: Held losartan and farxiga for soft pressures   Assessment and Plan:  Kyle Farley is a 80 year old male presenting with right femur and pelvic rami fracture now status post repair with ORIF.  Past medical history significant for A. fib, COPD, hypertension, GERD, alcohol abuse, hypothyroidism, BPH  Right femur fracture status post open reduction internal fixation nondisplaced right inferior pubic ramus fracture Patient is medically stable for SNF.  He reports that  - Orthopedics has been following, appreciate recommendations - Tylenol 650 mg every 4 hours as needed - Oxycodone 2.5 to 5 mg every 4 hours as needed for severe pain -Lidocaine patch for pain relief -PT OT evaluate and treat -TOC following for SNF placement, appreciate assistance  Chest pain A. Fib HFrEF CAD No current chest pain, shortness of breath, or fatigue. TTE: LV EF 40-45%. Slightly tachy to 104.  -Cardiology following appreciate recs -Holding amio and not giving flecainide  -Per cardio, considering outpt ablation  -On eliquis  -Aldactone can be added to regimen outpatient  -No diuresis, no volume overload on exam   Hypotension Patient has had soft blood pressures while in hospital. We had previously held his metoprolol and likely got reflex tachycardia from this. We have restarted his metoprolol. Currently holding losartan and farxiga.  Current blood pressures: 136/86, 116/73. Pt had several soft Bps over night: 78/55, 98/59 -Continue holding losartan and farxiga until re-evaluated with PCP outpt  -Continue with metoprolol -Monitor BPs  Anemia, stable  Patient has received 4 units of packed red blood cells since admission.  Transfusion threshold is 8. Current Hgb is 8.0 from 8.3 and is stable. Pt has had no evidence of bleeding on exam and denies any dizziness or lightheadedness. Repeat Hgb later today prior to d/c.  -Stable anemia, Repeat Hgb at 1200 -Continue to monitor   Constipation Patient has had bowel movement.  Abdominal exam benign today.  -Current bowel regimen is MiraLAX 2 times daily with senna 2 tablets twice daily -S/p lactulose 10 g given yesterday -Will hold off on lactulose for now   Difficulty sleeping Melatonin 3 mg nightly  Hypothyroidism  -Elevated TSH 43 with low T3, T4 -Levothyroxine 77mg  -Follow up outpt   FEN/GI: Heart healthy PPx: Eliquis Dispo: Patient is medically stable for SNF or d/c home with full care   Subjective:  Patient denies any complaints at this time.  His pain is well controlled and he has good movement.  Denies any lightheadedness or dizziness, chest pain or abnormal shortness of breath. Chronically, he has SOB from COPD.   Objective: Temp:  [97.2 F (36.2 C)-99.6 F (37.6 C)] 99.6 F (37.6 C) (09/02 0816) Pulse Rate:  [72-104] 104 (09/02 0816) Resp:  [17-20] 17 (09/02 0816) BP: (74-136)/(55-86) 136/86 (09/02 0816) SpO2:  [90 %-97 %] 97 % (09/02 0816) Weight:  [77.3 kg] 77.3 kg (09/02 0500) Physical Exam Constitutional:      Appearance: Normal appearance.  Cardiovascular:  Rate and Rhythm: Regular rhythm. Tachycardia present.     Pulses: Normal pulses.     Heart sounds: No murmur heard.   No friction rub. No gallop.  Pulmonary:     Effort: Pulmonary effort is normal.     Breath sounds: Normal breath sounds.  Abdominal:     General: Bowel sounds are  normal. There is no distension.     Palpations: Abdomen is soft.     Tenderness: There is no abdominal tenderness.  Musculoskeletal:     Comments: Right hip incisions covered with dressing and clean and dry.   Skin:    General: Skin is warm and dry.     Findings: No bruising.  Neurological:     Mental Status: He is alert.  Psychiatric:        Mood and Affect: Mood normal.        Behavior: Behavior normal.     Laboratory: Recent Labs  Lab 09/17/20 0138 09/18/20 0416 09/19/20 0237  WBC 11.9* 9.5 9.0  HGB 8.8* 8.3* 8.0*  HCT 27.0* 25.6* 24.4*  PLT 207 234 236   Recent Labs  Lab 09/13/20 0018 09/13/20 1514 09/14/20 0350 09/16/20 0238 09/17/20 0138 09/18/20 0416  NA 126* 125*   < > 127* 128* 129*  K 3.7 4.0   < > 4.9 4.5 4.1  CL 94* 93*   < > 95* 94* 93*  CO2 27 26   < > '26 28 29  '$ BUN 7* 11   < > '13 13 12  '$ CREATININE 0.86 0.98   < > 0.91 0.85 0.80  CALCIUM 7.9* 8.0*   < > 8.3* 8.5* 8.3*  PROT 4.8* 4.7*  --   --   --   --   BILITOT 0.6 0.7  --   --   --   --   ALKPHOS 105 93  --   --   --   --   ALT 11 11  --   --   --   --   AST 16 15  --   --   --   --   GLUCOSE 133* 97   < > 140* 114* 94   < > = values in this interval not displayed.    Hgb 8.8>8.3>8   Erskine Emery, MD 09/19/2020, 11:08 AM PGY-1, [place] Intern pager: [phone], text pages welcome

## 2020-09-19 NOTE — TOC Progression Note (Signed)
Transition of Care [facility]) - Progression Note    Patient Details  Name: Kyle Farley MRN: [id number] Date of Birth: Jun 04, 1940  Transition of Care [facility]) CM/SW [place], [place] Phone Number: 09/19/2020, 12:19 PM  Clinical Narrative:    CSW spoke with pt to give him bed offers. Pt now wanting to go home with [facility], and not to rehab. He stated the plan was to move in with his son. CSW asked if she could follow up with the son, verbal permission was given. CSW spoke with Kerry Dory who stated he was aware of this change of plans, and that he could bring pt home when he is ready to discharge. Pt has a rollator and cane. CSW updated treatment team, TOC will continue to follow.   Expected Discharge Plan: [place] Barriers to Discharge: Continued Medical Work up, Ship broker  Expected Discharge Plan and Services Expected Discharge Plan: [place] arrangements for the past 2 months: Single Family Home                                       Social Determinants of Health (SDOH) Interventions    Readmission Risk Interventions No flowsheet data found.

## 2020-09-19 NOTE — Progress Notes (Signed)
Nutrition Follow-up  DOCUMENTATION CODES:   Not applicable  INTERVENTION:   - Increase Ensure Enlive po to TID, each supplement provides 350 kcal and 20 grams of protein  - MVI with minerals daily  NUTRITION DIAGNOSIS:   Increased nutrient needs related to hip fracture as evidenced by estimated needs.  Ongoing, being addressed via oral nutrition supplements  GOAL:   Patient will meet greater than or equal to 90% of their needs  Progressing  MONITOR:   PO intake, Supplement acceptance, Weight trends, Labs, I & O's  REASON FOR ASSESSMENT:   Consult Assessment of nutrition requirement/status  ASSESSMENT:   Patient with PMH significant for PAF, COPD, CHF, GERD, HLD, essential HTN, and ETOH use. Presents this admission with R periprosthetic femur fx and pelvic rami fx.  7/05 - s/p ORIF of R proximal femur fracture  Attempted to speak with pt at bedside; however, pt unavailable. Pt with excellent PO intake with 6 out of last 8 meal completions being >/= 95%. Pt accepting Ensure Enlive supplements per [facility] documentation. Will continue these as well as MVI with minerals daily.  Pt's current weight is 77.3 kg and last measured weight on 8/30 was 80.1 kg. Suspect weight loss related to fluid status given pt is net negative 2.7 L since admission. Per nursing assessment, pt with non-pitting edema to RLE.  Meal Completion: 50-100% x last 8 meals  Medications reviewed and include: calcium carbonate, Ensure Enlive BID, melatonin, MVI with minerals daily, protonix, miralax, senna  Labs reviewed: sodium 129, hemoglobin 8.0  UOP: 1750 ml x 24 hours I/O's: -2.7 L since admit  Diet Order:   Diet Order             Diet Heart Room service appropriate? Yes; Fluid consistency: Thin  Diet effective now                   EDUCATION NEEDS:   Education needs have been addressed  Skin:  Skin Assessment: Skin Integrity Issues: Stage II: L buttocks (first documented on  8/30) Incisions: R leg  Last BM:  09/18/20 medium type 4  Height:   Ht Readings from Last 1 Encounters:  09/15/20 '5\' 9"'$  (1.753 m)    Weight:   Wt Readings from Last 1 Encounters:  09/19/20 77.3 kg    BMI:  Body mass index is 25.17 kg/m.  Estimated Nutritional Needs:   Kcal:  1800-2000 kcal  Protein:  90-105 grams  Fluid:  >/= 1.8 L/day    Gustavus Bryant, MS, RD, LDN Inpatient Clinical Dietitian Please see AMiON for contact information.

## 2020-09-20 DIAGNOSIS — S7291XD Unspecified fracture of right femur, subsequent encounter for closed fracture with routine healing: Secondary | ICD-10-CM | POA: Diagnosis not present

## 2020-09-20 DIAGNOSIS — J449 Chronic obstructive pulmonary disease, unspecified: Secondary | ICD-10-CM | POA: Diagnosis not present

## 2020-09-20 LAB — CBC
HCT: 28.5 % — ABNORMAL LOW (ref 39.0–52.0)
Hemoglobin: 9.4 g/dL — ABNORMAL LOW (ref 13.0–17.0)
MCH: 29.1 pg (ref 26.0–34.0)
MCHC: 33 g/dL (ref 30.0–36.0)
MCV: 88.2 fL (ref 80.0–100.0)
Platelets: 273 10*3/uL (ref 150–400)
RBC: 3.23 MIL/uL — ABNORMAL LOW (ref 4.22–5.81)
RDW: 15.5 % (ref 11.5–15.5)
WBC: 10.4 10*3/uL (ref 4.0–10.5)
nRBC: 0 % (ref 0.0–0.2)

## 2020-09-20 LAB — TYPE AND SCREEN
ABO/RH(D): A POS
Antibody Screen: NEGATIVE
Unit division: 0

## 2020-09-20 LAB — BPAM RBC
Blood Product Expiration Date: 202210012359
ISSUE DATE / TIME: 202209022036
Unit Type and Rh: 6200

## 2020-09-20 LAB — HEMOGLOBIN AND HEMATOCRIT, BLOOD
HCT: 27.8 % — ABNORMAL LOW (ref 39.0–52.0)
Hemoglobin: 9 g/dL — ABNORMAL LOW (ref 13.0–17.0)

## 2020-09-20 MED ORDER — ASPIRIN 81 MG PO TBEC: 81.0000 mg | DELAYED_RELEASE_TABLET | Freq: Every day | ORAL | 11 refills | Status: DC

## 2020-09-20 MED ORDER — METOPROLOL SUCCINATE ER 50 MG PO TB24: 50.0000 mg | ORAL_TABLET | Freq: Every day | ORAL | 1 refills | Status: DC

## 2020-09-20 MED ORDER — LEVOTHYROXINE SODIUM 50 MCG PO TABS: 50.0000 ug | ORAL_TABLET | Freq: Every day | ORAL | 1 refills | Status: DC

## 2020-09-20 MED ORDER — ACETAMINOPHEN 325 MG PO TABS: 650.0000 mg | ORAL_TABLET | ORAL | Status: DC

## 2020-09-20 NOTE — Progress Notes (Addendum)
Family Medicine Teaching Service Daily Progress Note Intern Pager: [phone]  Patient name: Kyle Farley Medical record number: [id number] Date of birth: [birth year] Age: 80 y.o. Gender: male  Primary Care Provider: Zoila Shutter, NP Consultants: cardio, ortho (all s/o) Code Status: Full  Pt Overview and Major Events to Date:  8/26: Admitted to [facility] for inferior pubic ramis and femur fracture  8/27: Transfused a total of 4 u pRBCs 8/28: Hypotensive but stable 8/29: LHC, right hip surgery  8/30: Restarted heparin  8/31: d/c heparin and start eliquis  9/2: Held losartan and farxiga for soft pressures   Assessment and Plan:  Kyle Farley is a 80 yo male presenting with right femur and pelvic rami fracture now status postrepair with ORIF. Past medical history significant for A. fib, COPD, hypertension, GERD, alcohol abuse, hypothyroidism, BPH.  Right femur fracture status post open reduction internal fixation nondisplaced right inferior pubic ramus fracture Patient is medically stable for SNF.  Denies any complaints today. -Ortho has been following, appreciate recommendations - Tylenol 650 mg every 4 hours as needed - Oxy 2.5 to 5 mg every 4 hours as needed for severe pain - Lidocaine patch for pain relief - PT OT evaluate and treat - TOC was following, but the pt would like to go home to stay with son under his care  Chest pain A. Fib HFrEF CAD Current chest pain, SOB, or fatigue.  Slightly tachy to 103.  Stable. -Cardiology has been following appreciate recommendations -Holding amio and not giving flecainide -Per cardio, considering outpatient ablation -On Eliquis -Aldactone can be added to regimen outpatient per cardio -No need for diuresis at this time  Hypotension Patient has had soft blood pressures while in the hospital during his stay.  We are holding losartan and Farxiga and blood pressures have looked good.  Blood pressure 109/67.  Patient is on metoprolol.   -Continue with metoprolol -Monitor pressure -New to hold losartan and Farxiga until he can be reevaluated with PCP outpatient  Anemia  Updated Hgb s/p another U pRBCs is 9.4.  This would make a total of 5 units of blood that have been given to him during the stay.  Patient is asymptomatic today without any lightheadedness or dizziness.  No evidence of bleeding on physical exam. -We will get another updated H/H '@1300'$ .   -If hemoglobin remains stable at that time we can discharge  FEN/GI: Heart Healthy  PPx: Eliquis  Dispo:Home with 24 hour care. This was pt decision versus SNF, which he declined.  Subjective:  Patient reports he is doing well today.  He denies any chest pain or new onset shortness of breath.  His hip pain is well controlled and he has maintained mobility.  Denies any dizziness or lightheadedness.  He would really like to go home today.   Objective: Temp:  [97.7 F (36.5 C)-98.1 F (36.7 C)] 98.1 F (36.7 C) (09/03 0316) Pulse Rate:  [73-103] 103 (09/03 0316) Resp:  [15-25] 25 (09/03 0316) BP: (104-126)/(67-86) 109/67 (09/03 0316) SpO2:  [94 %-97 %] 95 % (09/03 0316) Weight:  [77.4 kg] 77.4 kg (09/03 0500) Physical Exam Constitutional:      General: He is not in acute distress.    Appearance: Normal appearance. He is not ill-appearing or diaphoretic.  Cardiovascular:     Rate and Rhythm: Regular rhythm.     Heart sounds: No murmur heard.   No gallop.     Comments: Slightly tachycardic.   Pulmonary:  Effort: Pulmonary effort is normal.     Breath sounds: Normal breath sounds.  Abdominal:     General: Bowel sounds are normal. There is no distension.     Palpations: Abdomen is soft.     Tenderness: There is no abdominal tenderness.  Skin:    General: Skin is warm and dry.     Comments: Hip incisions covered with dressing. Clean and dry with no strikethrough.   Neurological:     Mental Status: He is alert.     Laboratory: Recent Labs  Lab  09/18/20 0416 09/19/20 0237 09/19/20 1446 09/20/20 0741  WBC 9.5 9.0  --  10.4  HGB 8.3* 8.0* 7.8* 9.4*  HCT 25.6* 24.4* 24.1* 28.5*  PLT 234 236  --  273   Recent Labs  Lab 09/13/20 1514 09/14/20 0350 09/16/20 0238 09/17/20 0138 09/18/20 0416  NA 125*   < > 127* 128* 129*  K 4.0   < > 4.9 4.5 4.1  CL 93*   < > 95* 94* 93*  CO2 26   < > '26 28 29  '$ BUN 11   < > '13 13 12  '$ CREATININE 0.98   < > 0.91 0.85 0.80  CALCIUM 8.0*   < > 8.3* 8.5* 8.3*  PROT 4.7*  --   --   --   --   BILITOT 0.7  --   --   --   --   ALKPHOS 93  --   --   --   --   ALT 11  --   --   --   --   AST 15  --   --   --   --   GLUCOSE 97   < > 140* 114* 94   < > = values in this interval not displayed.      Erskine Emery, MD 09/20/2020, 12:03 PM PGY-1, [place] Intern pager: [phone], text pages welcome

## 2020-09-20 NOTE — TOC Transition Note (Signed)
Transition of Care [facility]) - CM/SW Discharge Note   Patient Details  Name: Kyle Farley MRN: [id number] Date of Birth: [birth year]  Transition [facility] Care [facility] CM/SW Contact:  Bartholomew Crews, RN Phone Number: [phone] 09/20/2020, 4:52 PM   Clinical Narrative:     Spoke with patient on hospital room phone about transition home today. Patient stated that his son is on his way to pick patient up. Patient stated that he has [facility] services for nursing and PT with [place]. Demographics and HH orders faxed for resumption of services. No further TOC needs identified.   Final next level of care: Home w Home Health Services Barriers to Discharge: No Barriers Identified   Patient Goals and CMS Choice Patient states their goals for this hospitalization and ongoing recovery are:: Return home with his son CMS Medicare.gov Compare Post Acute Care list provided to:: Patient Choice offered to / list presented to : Patient  Discharge Placement                       Discharge Plan and Services                DME Arranged: N/A DME Agency: NA       HH Arranged: RN, PT, OT [place] Agency: [facility] Date [place]: 09/20/20 Time [place]: 1651 Representative spoke with at [place]: [place] orders/Demographics faxed to [phone]  Social Determinants of Health (SDOH) Interventions     Readmission Risk Interventions No flowsheet data found.

## 2020-11-19 ENCOUNTER — Other Ambulatory Visit: Payer: Self-pay | Admitting: Family Medicine

## 2020-11-21 ENCOUNTER — Other Ambulatory Visit: Payer: Self-pay

## 2020-11-25 ENCOUNTER — Ambulatory Visit: Payer: Medicare Other | Admitting: Cardiology

## 2020-11-25 NOTE — Progress Notes (Deleted)
Cardiology Office Note:    Date:  11/25/2020   ID:  Kyle Farley, DOB 03-Feb-1940, MRN [id number]  PCP:  Zoila Shutter, NP  Cardiologist:  Shirlee More, MD    Referring MD: Zoila Shutter, NP    ASSESSMENT:    No diagnosis found. PLAN:    In order of problems listed above:  ***   Next appointment: ***   Medication Adjustments/Labs and Tests Ordered: Current medicines are reviewed at length with the patient today.  Concerns regarding medicines are outlined above.  No orders of the defined types were placed in this encounter.  No orders of the defined types were placed in this encounter.   No chief complaint on file.   History of Present Illness:    Kyle Farley is a 80 y.o. male with a hx of persistent atrial fibrillation hypertensive heart disease COPD and admission to [facility] in August after a fall with traumatic hip fracture last seen 09/17/2020 following coronary angiography prior to the ORIF. Compliance with diet, lifestyle and medications: ***  He had left heart catheterization performed 09/15/2020 with mild CAD ostial right coronary artery and 50% proximal LAD 30% proximal left circumflex 40%. Transthoracic echocardiogram performed August 13, 2020 with EF mildly to moderately reduced 40 to 45% global hypokinesia normal right ventricular size and function pulmonary artery pressure left atrium is moderately dilated with mild mitral regurgitation. EKG [facility] 09/18/2020 showed sinus tachycardia 106 bpm right bundle branch block independently reviewed Past Medical History:  Diagnosis Date   Alcohol use 09/12/2020   Arthritis    CHF (congestive heart failure) ([place])    Cigarette smoker 10/21/2014   COPD (chronic obstructive pulmonary disease) (H[facility])    COPD exacerbation ([place]) 01/24/2017   Dyspnea    Dysrhythmia    atrial fibrillation   Essential hypertension 10/21/2014   Femur fracture, right ([place]) 09/12/2020   GERD (gastroesophageal  reflux disease)    History of kidney stones    Hyperlipidemia 01/24/2017   Hypertension    Kidney stones 01/24/2017   Paroxysmal atrial fibrillation ([place]) 10/21/2014   Pressure injury of skin 09/16/2020    Past Surgical History:  Procedure Laterality Date   LEFT HEART CATH AND CORONARY ANGIOGRAPHY N/A 09/15/2020   Procedure: LEFT HEART CATH AND CORONARY ANGIOGRAPHY;  Surgeon: Sherren Mocha, MD;  Location: [place] CV LAB;  Service: Cardiovascular;  Laterality: N/A;   LEG SURGERY Right    steal pin placed in right leg 35 years ago   ORIF FEMUR FRACTURE Right 09/15/2020   Procedure: OPEN REDUCTION INTERNAL FIXATION FEMORAL SHAFT FRACTURE;  Surgeon: Shona Needles, MD;  Location: [facility];  Service: Orthopedics;  Laterality: Right;    Current Medications: No outpatient medications have been marked as taking for the 11/25/20 encounter (Appointment) with Richardo Priest, MD.     Allergies:   Patient has no known allergies.   Social History   Socioeconomic History   Marital status: Married    Spouse name: Not on file   Number of children: Not on file   Years of education: Not on file   Highest education level: Not on file  Occupational History   Not on file  Tobacco Use   Smoking status: Every Day    Packs/day: 1.00    Years: 62.00    Pack years: 62.00    Types: Cigarettes, Cigars   Smokeless tobacco: Never  Vaping Use   Vaping Use: Never used  Substance and  Sexual Activity   Alcohol use: Yes    Alcohol/week: 5.0 standard drinks    Types: 5 Standard drinks or equivalent per week    Comment: 5th of rum a week or 1 drink a day   Drug use: No   Sexual activity: Not Currently  Other Topics Concern   Not on file  Social History Narrative   Not on file   Social Determinants of Health   Financial Resource Strain: Not on file  Food Insecurity: Not on file  Transportation Needs: Not on file  Physical Activity: Not on file  Stress: Not on file  Social Connections: Not on file      Family History: The patient's ***family history includes Breast cancer in his sister; Cancer in his mother; Diabetes in his mother. ROS:   Please see the history of present illness.    All other systems reviewed and are negative.  EKGs/Labs/Other Studies Reviewed:    The following studies were reviewed today:  EKG:  EKG ordered today and personally reviewed.  The ekg ordered today demonstrates ***  Recent Labs: 09/12/2020: TSH 43.490 09/13/2020: ALT 11; B Natriuretic Peptide 230.4 09/18/2020: BUN 12; Creatinine, Ser 0.80; Potassium 4.1; Sodium 129 09/20/2020: Hemoglobin 9.0; Platelets 273  Recent Lipid Panel No results found for: CHOL, TRIG, HDL, CHOLHDL, VLDL, LDLCALC, LDLDIRECT  Physical Exam:    VS:  There were no vitals taken for this visit.    Wt Readings from Last 3 Encounters:  09/20/20 170 lb 10.2 oz (77.4 kg)  10/10/19 156 lb (70.8 kg)  04/12/19 164 lb (74.4 kg)     GEN: *** Well nourished, well developed in no acute distress HEENT: Normal NECK: No JVD; No carotid bruits LYMPHATICS: No lymphadenopathy CARDIAC: ***RRR, no murmurs, rubs, gallops RESPIRATORY:  Clear to auscultation without rales, wheezing or rhonchi  ABDOMEN: Soft, non-tender, non-distended MUSCULOSKELETAL:  No edema; No deformity  SKIN: Warm and dry NEUROLOGIC:  Alert and oriented x 3 PSYCHIATRIC:  Normal affect    Signed, Shirlee More, MD  11/25/2020 7:22 AM    [place]

## 2020-12-17 ENCOUNTER — Other Ambulatory Visit: Payer: Self-pay | Admitting: Family Medicine

## 2021-03-05 DIAGNOSIS — R41841 Cognitive communication deficit: Secondary | ICD-10-CM | POA: Insufficient documentation

## 2021-03-05 DIAGNOSIS — F17200 Nicotine dependence, unspecified, uncomplicated: Secondary | ICD-10-CM | POA: Insufficient documentation

## 2021-03-05 DIAGNOSIS — E871 Hypo-osmolality and hyponatremia: Secondary | ICD-10-CM

## 2021-03-05 DIAGNOSIS — M6259 Muscle wasting and atrophy, not elsewhere classified, multiple sites: Secondary | ICD-10-CM

## 2021-03-05 DIAGNOSIS — R2689 Other abnormalities of gait and mobility: Secondary | ICD-10-CM | POA: Insufficient documentation

## 2021-03-05 DIAGNOSIS — R278 Other lack of coordination: Secondary | ICD-10-CM

## 2021-03-05 DIAGNOSIS — F17210 Nicotine dependence, cigarettes, uncomplicated: Secondary | ICD-10-CM

## 2021-03-05 HISTORY — DX: Muscle wasting and atrophy, not elsewhere classified, multiple sites: M62.59

## 2021-03-05 HISTORY — DX: Other lack of coordination: R27.8

## 2021-03-05 HISTORY — DX: Nicotine dependence, cigarettes, uncomplicated: F17.210

## 2021-03-05 HISTORY — DX: Other abnormalities of gait and mobility: R26.89

## 2021-03-05 HISTORY — DX: Cognitive communication deficit: R41.841

## 2021-03-05 HISTORY — DX: Hypo-osmolality and hyponatremia: E87.1

## 2021-06-10 DIAGNOSIS — I350 Nonrheumatic aortic (valve) stenosis: Secondary | ICD-10-CM

## 2021-06-10 DIAGNOSIS — I361 Nonrheumatic tricuspid (valve) insufficiency: Secondary | ICD-10-CM

## 2021-06-12 DIAGNOSIS — I480 Paroxysmal atrial fibrillation: Secondary | ICD-10-CM

## 2021-06-12 DIAGNOSIS — I10 Essential (primary) hypertension: Secondary | ICD-10-CM

## 2021-06-12 DIAGNOSIS — I2510 Atherosclerotic heart disease of native coronary artery without angina pectoris: Secondary | ICD-10-CM

## 2021-06-12 DIAGNOSIS — E785 Hyperlipidemia, unspecified: Secondary | ICD-10-CM

## 2021-06-12 DIAGNOSIS — I484 Atypical atrial flutter: Secondary | ICD-10-CM

## 2021-06-12 DIAGNOSIS — I714 Abdominal aortic aneurysm, without rupture, unspecified: Secondary | ICD-10-CM | POA: Diagnosis not present

## 2021-10-12 NOTE — Progress Notes (Unsigned)
Cardiology Office Note:    Date:  10/14/2021   ID:  Kyle Farley, DOB 08-14-1940, MRN [id number]  PCP:  Zoila Shutter, NP  Cardiologist:  Shirlee More, MD    Referring MD: Zoila Shutter, NP    ASSESSMENT:    1. Paroxysmal atrial fibrillation (HCC)   2. Hypertensive heart disease with chronic systolic congestive heart failure ([place])   3. On amiodarone therapy   4. Mixed hyperlipidemia   5. Nonrheumatic aortic valve stenosis   6. Chronic anticoagulation   7. Mild CAD    PLAN:    In order of problems listed above:  Although he is maintaining sinus rhythm I do not think he can remain on amiodarone it appears he has very severe underlying lung disease certainly difficult to tell if he is having toxicity we will stop today do a chest x-ray and check labs including proBNP although he does not appear clinically to be in decompensated heart failure. He will continue his anticoagulant he will go home and research his medications if indeed he is taking aspirin he will stop Continue his statin we will also check a lipid profile He has mild aortic stenosis I would recheck an echocardiogram last done May And mild nonobstructive CAD and left heart catheterization 09/15/2020 with normal filling pressures I do not think this presentation is cardiac ischemia or decompensated heart failure   Next appointment: 3 months with me referral to EP   Medication Adjustments/Labs and Tests Ordered: Current medicines are reviewed at length with the patient today.  Concerns regarding medicines are outlined above.  No orders of the defined types were placed in this encounter.  No orders of the defined types were placed in this encounter.   Chief Complaint  Patient presents with   Follow-up   Atrial Fibrillation    History of Present Illness:    Kyle Farley is a 80 y.o. male with a hx of paroxysmal atrial fibrillation and atypical atrial flutter hypertensive heart disease with heart  failure and mixed hyperlipidemia last seen by me 10/10/2019   He was admitted by me at [facility] 06/12/2021 when he presented again to the hospital with atypical atrial flutter with 2-1 conduction he had been noncompliant with his medications including amiodarone was restarted on amiodarone twice daily beta-blocker anticoagulated and had stable CAD.  His echocardiogram that admission showed a low normal ejection fraction 50 to 55% severe left atrial enlargement mild right atrial enlargement and mild aortic stenosis.  Compliance with diet, lifestyle and medications: Yes  He is on low-dose amiodarone and anticoagulated He is very short of breath coming in the office initially I thought this was an acute problem but the longer I spent with him and its obvious that he is short of breath with any activities continues to smoke a pack and half of cigarettes a day and has home oxygen. His PCP has relocated and he is getting home nursing visits. He has chronic cough chronic sputum production bronchospasm but he is not having apnea exertional chest pain palpitation or syncope Having severe episodes of coughing  He is intolerant of flecainide when he had 1 1 conduction of atrial flutter So obvious that amiodarone would not be tolerated the severity of his underlying lung disease.   Recheck lab was including proBNP and also do a chest x-ray I do not think he has acute amiodarone toxicity  The difficulty should be how to manage him for recurrent atrial fibrillation I will refer  to my EP colleagues, I think you either require dofetilide or EP catheter ablation  He request referral to primary care Dr. Tobie Poet  It appears he is on an anticoagulant he did not bring a list with him he may or may not be taking aspirin until if he is when he gets home and checks meds not to take it  Recently his ACE inhibitor was increased due to blood pressure but he did not take it this morning and presented very short of  breath to the office walking in from the outside and into the exam room and was extremely hypertensive immediately and remains hypertensive but to a much lesser degree. Tells me his home blood pressure runs 130-140/80-90 Past Medical History:  Diagnosis Date   Alcohol use 09/12/2020   Arthritis    CHF (congestive heart failure) ([place])    Cigarette smoker 10/21/2014   COPD (chronic obstructive pulmonary disease) (HCC)    COPD exacerbation ([place]) 01/24/2017   Dyspnea    Dysrhythmia    atrial fibrillation   Essential hypertension 10/21/2014   Femur fracture, right ([place]) 09/12/2020   GERD (gastroesophageal reflux disease)    History of kidney stones    Hyperlipidemia 01/24/2017   Hypertension    Kidney stones 01/24/2017   Paroxysmal atrial fibrillation ([place]) 10/21/2014   Pressure injury of skin 09/16/2020    Past Surgical History:  Procedure Laterality Date   LEFT HEART CATH AND CORONARY ANGIOGRAPHY N/A 09/15/2020   Procedure: LEFT HEART CATH AND CORONARY ANGIOGRAPHY;  Surgeon: Sherren Mocha, MD;  Location: [facility];  Service: Cardiovascular;  Laterality: N/A;   LEG SURGERY Right    steal pin placed in right leg 35 years ago   ORIF FEMUR FRACTURE Right 09/15/2020   Procedure: OPEN REDUCTION INTERNAL FIXATION FEMORAL SHAFT FRACTURE;  Surgeon: Shona Needles, MD;  Location: [place];  Service: Orthopedics;  Laterality: Right;    Current Medications: Current Meds  Medication Sig   acetaminophen (TYLENOL) 325 MG tablet Take 2 tablets (650 mg total) by mouth every 4 (four) hours. (Patient taking differently: Take 650 mg by mouth every 4 (four) hours as needed for mild pain.)   albuterol ([name] HFA;VENTOLIN HFA) 108 (90 Base) MCG/ACT inhaler Inhale 2 puffs into the lungs every 4 (four) hours as needed for wheezing or shortness of breath.   apixaban (ELIQUIS) 5 MG TABS tablet Take 1 tablet (5 mg total) by mouth 2 (two) times daily.   atorvastatin (LIPITOR) 40 MG tablet Take 40 mg by mouth  daily.   budesonide (PULMICORT) 0.5 MG/2ML nebulizer solution Take 0.5 mg by nebulization 2 (two) times daily.   cloNIDine (CATAPRES) 0.1 MG tablet TAKE 1 TABLET BY MOUTH ONCE DAILY AS NEEDED FOR  BLOOD  PRESSURE  OVER  160/100   furosemide (LASIX) 20 MG tablet Take 20 mg by mouth daily.   ipratropium-albuterol (DUONEB) 0.5-2.5 (3) MG/3ML SOLN Inhale 3 mLs into the lungs every 6 (six) hours as needed for shortness of breath or wheezing.   levothyroxine (SYNTHROID) 50 MCG tablet Take 1 tablet (50 mcg total) by mouth daily before breakfast.   lisinopril (ZESTRIL) 40 MG tablet Take 40 mg by mouth daily at 12 noon.   metoprolol succinate (TOPROL-XL) 50 MG 24 hr tablet Take 1 tablet (50 mg total) by mouth daily. Take with or immediately following a meal.   montelukast (SINGULAIR) 10 MG tablet Take 10 mg by mouth daily at 12 noon.   oxyCODONE (OXY IR/ROXICODONE) 5 MG  immediate release tablet Take 1 tablet (5 mg total) by mouth every 4 (four) hours as needed for severe pain.   pantoprazole (PROTONIX) 40 MG tablet Take 1 tablet by mouth daily.   potassium chloride SA (KLOR-CON) 20 MEQ tablet Take 20 mEq by mouth daily.   tamsulosin (FLOMAX) 0.4 MG CAPS capsule Take 0.4 mg by mouth daily.   TRELEGY ELLIPTA 100-62.5-25 MCG/ACT AEPB Inhale 1 puff into the lungs daily.   Vitamin D, Ergocalciferol, (DRISDOL) 1.25 MG (50000 UNIT) CAPS capsule Take 50,000 Units by mouth 3 (three) times a week.   [DISCONTINUED] amiodarone (PACERONE) 200 MG tablet Take 200 mg by mouth 2 (two) times daily.   [DISCONTINUED] aspirin EC 81 MG EC tablet Take 1 tablet (81 mg total) by mouth daily. Swallow whole.     Allergies:   Diltiazem   Social History   Socioeconomic History   Marital status: Widowed    Spouse name: Not on file   Number of children: Not on file   Years of education: Not on file   Highest education level: Not on file  Occupational History   Not on file  Tobacco Use   Smoking status: Every Day     Packs/day: 1.00    Years: 62.00    Total pack years: 62.00    Types: Cigarettes, Cigars   Smokeless tobacco: Never  Vaping Use   Vaping Use: Never used  Substance and Sexual Activity   Alcohol use: Yes    Alcohol/week: 5.0 standard drinks of alcohol    Types: 5 Standard drinks or equivalent per week    Comment: 5th of rum a week or 1 drink a day   Drug use: No   Sexual activity: Not Currently  Other Topics Concern   Not on file  Social History Narrative   Not on file   Social Determinants of Health   Financial Resource Strain: Not on file  Food Insecurity: Not on file  Transportation Needs: Not on file  Physical Activity: Not on file  Stress: Not on file  Social Connections: Not on file     Family History: The patient's family history includes Breast cancer in his sister; Cancer in his mother; Diabetes in his mother. ROS:   Please see the history of present illness.    All other systems reviewed and are negative.  EKGs/Labs/Other Studies Reviewed:    The following studies were reviewed today: Left heart catheter 09/15/2020: Procedures  LEFT HEART CATH AND CORONARY ANGIOGRAPHY   Conclusion      Ost RCA to Prox RCA lesion is 50% stenosed.   Prox LAD lesion is 30% stenosed with 0% stenosed side branch in 1st Diag.   Prox Cx to Mid Cx lesion is 40% stenosed.   1.  Nonobstructive coronary artery disease with mild nonobstructive plaquing in the LAD, left circumflex, and moderate calcific stenosis of the RCA ostium. 2.  Low normal LVEDP   Recommend: No high-grade coronary stenoses identified.  Patient with femur fracture.  He should not be at excessive risk of ischemic cardiac complications.   EKG:  EKG ordered today and personally reviewed.  The ekg ordered today demonstrates sinus rhythm right bundle branch block normal QT interval    Physical Exam:    VS:  BP (!) 166/80   Pulse 72   Ht '5\' 9"'$  (1.753 m)   Wt 195 lb (88.5 kg)   SpO2 96%   BMI 28.80 kg/m      Wt Readings from  Last 3 Encounters:  10/14/21 195 lb (88.5 kg)  09/20/20 170 lb 10.2 oz (77.4 kg)  10/10/19 156 lb (70.8 kg)     GEN: Very breathless improved during the time of his office visit well nourished, well developed in no acute distress HEENT: Normal NECK: No JVD; No carotid bruits LYMPHATICS: No lymphadenopathy CARDIAC: Distant heart sounds RRR, no murmurs, rubs, gallops RESPIRATORY: Hyperinflated chest prolonged expiration and expiratory wheezing ABDOMEN: Soft, non-tender, non-distended MUSCULOSKELETAL:  No edema; No deformity  SKIN: Warm and dry NEUROLOGIC:  Alert and oriented x 3 PSYCHIATRIC:  Normal affect    Signed, Shirlee More, MD  10/14/2021 8:09 AM    [place]

## 2021-10-14 ENCOUNTER — Encounter: Payer: Self-pay | Admitting: Cardiology

## 2021-10-14 ENCOUNTER — Ambulatory Visit: Payer: Medicare Other | Attending: Cardiology | Admitting: Cardiology

## 2021-10-14 VITALS — BP 166/80 | HR 72 | Ht 69.0 in | Wt 195.0 lb

## 2021-10-14 DIAGNOSIS — I480 Paroxysmal atrial fibrillation: Secondary | ICD-10-CM | POA: Diagnosis not present

## 2021-10-14 DIAGNOSIS — E782 Mixed hyperlipidemia: Secondary | ICD-10-CM

## 2021-10-14 DIAGNOSIS — I2510 Atherosclerotic heart disease of native coronary artery without angina pectoris: Secondary | ICD-10-CM

## 2021-10-14 DIAGNOSIS — Z79899 Other long term (current) drug therapy: Secondary | ICD-10-CM

## 2021-10-14 DIAGNOSIS — I5022 Chronic systolic (congestive) heart failure: Secondary | ICD-10-CM

## 2021-10-14 DIAGNOSIS — Z7901 Long term (current) use of anticoagulants: Secondary | ICD-10-CM

## 2021-10-14 DIAGNOSIS — I110 Hypertensive heart disease with heart failure: Secondary | ICD-10-CM | POA: Diagnosis not present

## 2021-10-14 DIAGNOSIS — I350 Nonrheumatic aortic (valve) stenosis: Secondary | ICD-10-CM | POA: Diagnosis not present

## 2021-10-14 NOTE — Patient Instructions (Signed)
Medication Instructions:  Your physician has recommended you make the following change in your medication:   STOP: Amiodarone STOP: Aspirin  *If you need a refill on your cardiac medications before your next appointment, please call your pharmacy*   Lab Work: Your physician recommends that you return for lab work in:   Labs today: CMP, TSH T3 T4, Pro BNP, Lipids  If you have labs (blood work) drawn today and your tests are completely normal, you will receive your results only by: MyChart Message (if you have MyChart) OR A paper copy in the mail If you have any lab test that is abnormal or we need to change your treatment, we will call you to review the results.   Testing/Procedures: Chest xray at [place]: At [facility], you and your health needs are our priority.  As part of our continuing mission to provide you with exceptional heart care, we have created designated Provider Care Teams.  These Care Teams include your primary Cardiologist (physician) and Advanced Practice Providers (APPs -  Physician Assistants and Nurse Practitioners) who all work together to provide you with the care you need, when you need it.  We recommend signing up for the patient portal called "MyChart".  Sign up information is provided on this After Visit Summary.  MyChart is used to connect with patients for Virtual Visits (Telemedicine).  Patients are able to view lab/test results, encounter notes, upcoming appointments, etc.  Non-urgent messages can be sent to your provider as well.   To learn more about what you can do with MyChart, go to [email or web address].    Your next appointment:   3 month(s)  The format for your next appointment:   In Person  Provider:   Shirlee More, MD    Other Instructions None  Important Information About Sugar

## 2021-10-15 LAB — TSH+T4F+T3FREE
Free T4: 1.16 ng/dL (ref 0.82–1.77)
T3, Free: 2.1 pg/mL (ref 2.0–4.4)
TSH: 14 u[IU]/mL — ABNORMAL HIGH (ref 0.450–4.500)

## 2021-10-15 LAB — COMPREHENSIVE METABOLIC PANEL
ALT: 28 IU/L (ref 0–44)
AST: 35 IU/L (ref 0–40)
Albumin/Globulin Ratio: 1.6 (ref 1.2–2.2)
Albumin: 4.3 g/dL (ref 3.8–4.8)
Alkaline Phosphatase: 121 IU/L (ref 44–121)
BUN/Creatinine Ratio: 7 — ABNORMAL LOW (ref 10–24)
BUN: 6 mg/dL — ABNORMAL LOW (ref 8–27)
Bilirubin Total: 0.4 mg/dL (ref 0.0–1.2)
CO2: 24 mmol/L (ref 20–29)
Calcium: 9.3 mg/dL (ref 8.6–10.2)
Chloride: 85 mmol/L — ABNORMAL LOW (ref 96–106)
Creatinine, Ser: 0.83 mg/dL (ref 0.76–1.27)
Globulin, Total: 2.7 g/dL (ref 1.5–4.5)
Glucose: 113 mg/dL — ABNORMAL HIGH (ref 70–99)
Potassium: 4.3 mmol/L (ref 3.5–5.2)
Sodium: 124 mmol/L — ABNORMAL LOW (ref 134–144)
Total Protein: 7 g/dL (ref 6.0–8.5)
eGFR: 88 mL/min/{1.73_m2} (ref >59)

## 2021-10-15 LAB — PRO B NATRIURETIC PEPTIDE: NT-Pro BNP: 776 pg/mL — ABNORMAL HIGH (ref 0–486)

## 2021-10-15 LAB — LIPID PANEL
Chol/HDL Ratio: 2 ratio (ref 0.0–5.0)
Cholesterol, Total: 188 mg/dL (ref 100–199)
HDL: 92 mg/dL (ref >39)
LDL Chol Calc (NIH): 80 mg/dL (ref 0–99)
Triglycerides: 93 mg/dL (ref 0–149)
VLDL Cholesterol Cal: 16 mg/dL (ref 5–40)

## 2021-10-26 ENCOUNTER — Telehealth: Payer: Self-pay

## 2021-10-26 DIAGNOSIS — I110 Hypertensive heart disease with heart failure: Secondary | ICD-10-CM

## 2021-10-26 MED ORDER — FUROSEMIDE 20 MG PO TABS: 20.0000 mg | ORAL_TABLET | ORAL | 1 refills | Status: DC

## 2021-10-26 NOTE — Telephone Encounter (Signed)
-----   Message from Tyler Pita, RN sent at 10/16/2021 12:55 PM EDT -----  ----- Message ----- From: Richardo Priest, MD Sent: 10/16/2021  11:12 AM EDT To: Rebeca Alert Ash/Hp Triage  His sodium concentration is low this is been a chronic problem  He needs to fluid restrict to 2 to 3 L a day  Reduce his diuretic to every other day  Recheck in 3 weeks BMP

## 2021-10-26 NOTE — Telephone Encounter (Signed)
Spoke with both patient and his son, notified of the following recommendations per Dr. Bettina Gavia and agreed to plan. Rx with new instruction sent. Expressed the importance of restricting his fluid intake. Aware of request labs. Order on file

## 2021-11-10 ENCOUNTER — Ambulatory Visit (INDEPENDENT_AMBULATORY_CARE_PROVIDER_SITE_OTHER): Payer: Medicare Other | Admitting: Nurse Practitioner

## 2021-11-10 ENCOUNTER — Encounter: Payer: Self-pay | Admitting: Nurse Practitioner

## 2021-11-10 VITALS — BP 128/74 | HR 91 | Temp 97.4°F | Ht 69.0 in | Wt 199.0 lb

## 2021-11-10 DIAGNOSIS — E782 Mixed hyperlipidemia: Secondary | ICD-10-CM | POA: Diagnosis not present

## 2021-11-10 DIAGNOSIS — I714 Abdominal aortic aneurysm, without rupture, unspecified: Secondary | ICD-10-CM

## 2021-11-10 DIAGNOSIS — N400 Enlarged prostate without lower urinary tract symptoms: Secondary | ICD-10-CM | POA: Insufficient documentation

## 2021-11-10 DIAGNOSIS — I5023 Acute on chronic systolic (congestive) heart failure: Secondary | ICD-10-CM | POA: Insufficient documentation

## 2021-11-10 DIAGNOSIS — I480 Paroxysmal atrial fibrillation: Secondary | ICD-10-CM

## 2021-11-10 DIAGNOSIS — I70213 Atherosclerosis of native arteries of extremities with intermittent claudication, bilateral legs: Secondary | ICD-10-CM

## 2021-11-10 DIAGNOSIS — F1014 Alcohol abuse with alcohol-induced mood disorder: Secondary | ICD-10-CM

## 2021-11-10 DIAGNOSIS — Z7951 Long term (current) use of inhaled steroids: Secondary | ICD-10-CM

## 2021-11-10 DIAGNOSIS — J449 Chronic obstructive pulmonary disease, unspecified: Secondary | ICD-10-CM

## 2021-11-10 DIAGNOSIS — Z23 Encounter for immunization: Secondary | ICD-10-CM | POA: Diagnosis not present

## 2021-11-10 DIAGNOSIS — E039 Hypothyroidism, unspecified: Secondary | ICD-10-CM | POA: Insufficient documentation

## 2021-11-10 DIAGNOSIS — Z7901 Long term (current) use of anticoagulants: Secondary | ICD-10-CM

## 2021-11-10 DIAGNOSIS — I10 Essential (primary) hypertension: Secondary | ICD-10-CM

## 2021-11-10 HISTORY — DX: Acute on chronic systolic (congestive) heart failure: I50.23

## 2021-11-10 HISTORY — DX: Hypothyroidism, unspecified: E03.9

## 2021-11-10 HISTORY — DX: Benign prostatic hyperplasia without lower urinary tract symptoms: N40.0

## 2021-11-10 HISTORY — DX: Atherosclerosis of native arteries of extremities with intermittent claudication, bilateral legs: I70.213

## 2021-11-10 HISTORY — DX: Alcohol abuse with alcohol-induced mood disorder: F10.14

## 2021-11-10 HISTORY — DX: Abdominal aortic aneurysm, without rupture, unspecified: I71.40

## 2021-11-10 NOTE — Patient Instructions (Addendum)
Prevnar -20 vaccine given in office today We will call you with referral to lung specialist  Pneumococcal Conjugate Vaccine (Prevnar 20) Suspension for Injection What is this medication? PNEUMOCOCCAL VACCINE (NEU mo KOK al vak SEEN) is a vaccine. It prevents pneumococcus bacterial infections. These bacteria can cause serious infections like pneumonia, meningitis, and blood infections. This vaccine will not treat an infection and will not cause infection. This vaccine is recommended for adults 18 years and older. This medicine may be used for other purposes; ask your health care provider or pharmacist if you have questions. COMMON BRAND NAME(S): Prevnar 20 What should I tell my care team before I take this medication? They need to know if you have any of these conditions: bleeding disorder fever immune system problems an unusual or allergic reaction to pneumococcal vaccine, diphtheria toxoid, other vaccines, other medicines, foods, dyes, or preservatives pregnant or trying to get pregnant breast-feeding How should I use this medication? This vaccine is injected into a muscle. It is given by a health care provider. A copy of Vaccine Information Statements will be given before each vaccination. Be sure to read this information carefully each time. This sheet may change often. Talk to your health care provider about the use of this medicine in children. Special care may be needed. Overdosage: If you think you have taken too much of this medicine contact a poison control center or emergency room at once. NOTE: This medicine is only for you. Do not share this medicine with others. What if I miss a dose? This does not apply. This medicine is not for regular use. What may interact with this medication? medicines for cancer chemotherapy medicines that suppress your immune function steroid medicines like prednisone or cortisone This list may not describe all possible interactions. Give your health  care provider a list of all the medicines, herbs, non-prescription drugs, or dietary supplements you use. Also tell them if you smoke, drink alcohol, or use illegal drugs. Some items may interact with your medicine. What should I watch for while using this medication? Mild fever and pain should go away in 3 days or less. Report any unusual symptoms to your health care provider. What side effects may I notice from receiving this medication? Side effects that you should report to your doctor or health care professional as soon as possible: allergic reactions (skin rash, itching or hives; swelling of the face, lips, or tongue) confusion fast, irregular heartbeat fever over 102 degrees F muscle weakness seizures trouble breathing unusual bruising or bleeding Side effects that usually do not require medical attention (report to your doctor or health care professional if they continue or are bothersome): fever of 102 degrees F or less headache joint pain muscle cramps, pain pain, tender at site where injected This list may not describe all possible side effects. Call your doctor for medical advice about side effects. You may report side effects to FDA at 1-800-FDA-1088. Where should I keep my medication? This vaccine is only given by a health care provider. It will not be stored at home. NOTE: This sheet is a summary. It may not cover all possible information. If you have questions about this medicine, talk to your doctor, pharmacist, or health care provider.  2023 Elsevier/Gold Standard (2019-09-07 00:00:00)    COPD and Physical Activity Chronic obstructive pulmonary disease (COPD) is a long-term, or chronic, condition that affects the lungs. COPD is a general term that can be used to describe many problems that cause inflammation of  the lungs and limit airflow. These conditions include chronic bronchitis and emphysema. The main symptom of COPD is shortness of breath, which makes it harder  to do even simple tasks. This can also make it harder to exercise and stay active. Talk with your health care provider about treatments to help you breathe better and actions you can take to prevent breathing problems during physical activity. What are the benefits of exercising when you have COPD? Exercising regularly is an important part of a healthy lifestyle. You can still exercise and do physical activities even though you have COPD. Exercise and physical activity improve your shortness of breath by increasing blood flow (circulation). This causes your heart to pump more oxygen through your body. Moderate exercise can: Improve oxygen use. Increase your energy level. Help with shortness of breath. Strengthen your breathing muscles. Improve heart health. Help with sleep. Improve your self-esteem and feelings of self-worth. Lower depression, stress, and anxiety. Exercise can benefit everyone with COPD. The severity of your disease may affect how hard you can exercise, especially at first, but everyone can benefit. Talk with your health care provider about how much exercise is safe for you, and which activities and exercises are safe for you. What actions can I take to prevent breathing problems during physical activity? Sign up for a pulmonary rehabilitation program. This type of program may include: Education about lung diseases. Exercise classes that teach you how to exercise and be more active while improving your breathing. This usually involves: Exercise using your lower extremities, such as a stationary bicycle. About 30 minutes of exercise, 2 to 5 times per week, for 6 to 12 weeks. Strength training, such as push-ups or leg lifts. Nutrition education. Group classes in which you can talk with others who also have COPD and learn ways to manage stress. If you use an oxygen tank, you should use it while you exercise. Work with your health care provider to adjust your oxygen for your  physical activity. Your resting flow rate is different from your flow rate during physical activity. How to manage your breathing while exercising While you are exercising: Take slow breaths. Pace yourself, and do nottry to go too fast. Purse your lips while breathing out. Pursing your lips is similar to a kissing or whistling position. If doing exercise that uses a quick burst of effort, such as weight lifting: Breathe in before starting the exercise. Breathe out during the hardest part of the exercise, such as raising the weights. Where to find support You can find support for exercising with COPD from: Your health care provider. A pulmonary rehabilitation program. Your local health department or community health programs. Support groups, either online or in-person. Your health care provider may be able to recommend support groups. Where to find more information You can find more information about exercising with COPD from: American Lung Association: lung.org COPD Foundation: copdfoundation.org Contact a health care provider if: Your symptoms get worse. You have nausea. You have a fever. You want to start a new exercise program or a new activity. Get help right away if: You have chest pain. You cannot breathe. These symptoms may represent a serious problem that is an emergency. Do not wait to see if the symptoms will go away. Get medical help right away. Call your local emergency services (911 in the [place] Do not drive yourself to the hospital. Summary COPD is a general term that can be used to describe many different lung problems that cause lung inflammation  and limit airflow. This includes chronic bronchitis and emphysema. Exercise and physical activity improve your shortness of breath by increasing blood flow (circulation). This causes your heart to provide more oxygen to your body. Contact your health care provider before starting any exercise program or new activity. Ask your  health care provider what exercises and activities are safe for you. This information is not intended to replace advice given to you by your health care provider. Make sure you discuss any questions you have with your health care provider. Document Revised: 11/13/2019 Document Reviewed: 11/13/2019 Elsevier Patient Education  [place].

## 2021-11-10 NOTE — Progress Notes (Signed)
New Patient Office Visit  Subjective    Patient ID: Kyle Farley, male    DOB: 04-May-1940  Age: 81 y.o. MRN: [id number]  CC:  Chief Complaint  Patient presents with   Establish Care    HPI Kyle Farley presents to establish care. This is his initial visit to the office. He was previously cared for by Dr Irven Shelling. He tells me his PCP relocated to [facility]. Pt states he does not like to drive far from [place] or [place]. He lives with his adult son that works full time. He tells me he does not like to ask son to take off work to take him to out-of-town appointments.   Pt has a history of COPD with continued cigarette smoking. Current treatment includes Pulmicort, Trelegy, and Duoneb. He denies previous PFT or pulmonology referral.   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 Currently taking Eliquis 5 mg BID and Metoprolol. He is followed by Dr Bettina Gavia, cardiologist.  Hypertension: Management includes Lisinopril, Metoprolol, Clonidine. Use of agents associated with hypertension: thyroid hormones.   Pertinent labs: Lab Results  Component Value Date   CHOL 188 10/14/2021   HDL 92 10/14/2021   LDLCALC 80 10/14/2021   TRIG 93 10/14/2021   CHOLHDL 2.0 10/14/2021   Lab Results  Component Value Date   NA 124 (L) 10/14/2021   K 4.3 10/14/2021   CREATININE 0.83 10/14/2021   EGFR 88 10/14/2021   GFRNONAA >60 09/18/2020   GLUCOSE 113 (H) 10/14/2021     Lipid/Cholesterol, Follow-up  Last lipid panel Other pertinent labs  Lab Results  Component Value Date   CHOL 188 10/14/2021   HDL 92 10/14/2021   LDLCALC 80 10/14/2021   TRIG 93 10/14/2021   CHOLHDL 2.0 10/14/2021   Lab Results  Component Value Date   ALT 28 10/14/2021   AST 35 10/14/2021   PLT 273 09/20/2020   TSH 14.000 (H) 10/14/2021     Management includes Lipitor.  Hypothyroidism: Pt has history of hypothyroidism. Last TSH 14. Current treatment  includes Levothyroxine 50 mcg QD.   GERD, Follow up:  Current treatment consist of: Protonix He reports excellent compliance with treatment. He is not having side effects. .  Vitamin D deficiency, follow-up  Lab Results  Component Value Date   VD25OH 114.48 (H) 09/15/2020   VD25OH 122.73 (H) 09/13/2020   CALCIUM 9.3 10/14/2021   CALCIUM 8.3 (L) 09/18/2020   Wt Readings from Last 3 Encounters:  11/10/21 199 lb (90.3 kg)  10/14/21 195 lb (88.5 kg)  09/20/20 170 lb 10.2 oz (77.4 kg)   Management since that visit includes Vitamin D 50,000 units. He reports excellent compliance with treatment. He is not having side effects.   Outpatient Encounter Medications as of 11/10/2021  Medication Sig   Magnesium 400 MG CAPS Take by mouth.   albuterol ([name] HFA;VENTOLIN HFA) 108 (90 Base) MCG/ACT inhaler Inhale 2 puffs into the lungs every 4 (four) hours as needed for wheezing or shortness of breath.   apixaban (ELIQUIS) 5 MG TABS tablet Take 1 tablet (5 mg total) by mouth 2 (two) times daily.   atorvastatin (LIPITOR) 40 MG tablet Take 40 mg by mouth daily.   budesonide (PULMICORT) 0.5 MG/2ML nebulizer solution Take 0.5 mg by nebulization 2 (two) times daily.   cloNIDine (CATAPRES) 0.1 MG tablet TAKE 1 TABLET BY MOUTH ONCE DAILY AS NEEDED FOR  BLOOD  PRESSURE  OVER  160/100  furosemide (LASIX) 20 MG tablet Take 1 tablet (20 mg total) by mouth every other day.   ipratropium-albuterol (DUONEB) 0.5-2.5 (3) MG/3ML SOLN Inhale 3 mLs into the lungs every 6 (six) hours as needed for shortness of breath or wheezing.   levothyroxine (SYNTHROID) 50 MCG tablet Take 1 tablet (50 mcg total) by mouth daily before breakfast.   lisinopril (ZESTRIL) 40 MG tablet Take 40 mg by mouth daily at 12 noon.   metoprolol succinate (TOPROL-XL) 50 MG 24 hr tablet Take 1 tablet (50 mg total) by mouth daily. Take with or immediately following a meal.   montelukast (SINGULAIR) 10 MG tablet Take 10 mg by mouth daily at  12 noon.   oxyCODONE (OXY IR/ROXICODONE) 5 MG immediate release tablet Take 1 tablet (5 mg total) by mouth every 4 (four) hours as needed for severe pain.   pantoprazole (PROTONIX) 40 MG tablet Take 1 tablet by mouth daily.   potassium chloride SA (KLOR-CON) 20 MEQ tablet Take 20 mEq by mouth daily.   tamsulosin (FLOMAX) 0.4 MG CAPS capsule Take 0.4 mg by mouth daily.   TRELEGY ELLIPTA 100-62.5-25 MCG/ACT AEPB Inhale 1 puff into the lungs daily.   Vitamin D, Ergocalciferol, (DRISDOL) 1.25 MG (50000 UNIT) CAPS capsule Take 50,000 Units by mouth 3 (three) times a week.   [DISCONTINUED] acetaminophen (TYLENOL) 325 MG tablet Take 2 tablets (650 mg total) by mouth every 4 (four) hours. (Patient taking differently: Take 650 mg by mouth every 4 (four) hours as needed for mild pain.)   No facility-administered encounter medications on file as of 11/10/2021.    Past Medical History:  Diagnosis Date   Alcohol use 09/12/2020   Arthritis    CHF (congestive heart failure) ([place])    Cigarette smoker 10/21/2014   COPD (chronic obstructive pulmonary disease) (HCC)    COPD exacerbation ([place]) 01/24/2017   Dyspnea    Dysrhythmia    atrial fibrillation   Essential hypertension 10/21/2014   Femur fracture, right ([place]) 09/12/2020   GERD (gastroesophageal reflux disease)    History of kidney stones    Hyperlipidemia 01/24/2017   Hypertension    Kidney stones 01/24/2017   Paroxysmal atrial fibrillation ([place]) 10/21/2014   Pressure injury of skin 09/16/2020    Past Surgical History:  Procedure Laterality Date   LEFT HEART CATH AND CORONARY ANGIOGRAPHY N/A 09/15/2020   Procedure: LEFT HEART CATH AND CORONARY ANGIOGRAPHY;  Surgeon: Sherren Mocha, MD;  Location: [place];  Service: Cardiovascular;  Laterality: N/A;   LEG SURGERY Right    steal pin placed in right leg 35 years ago   ORIF FEMUR FRACTURE Right 09/15/2020   Procedure: OPEN REDUCTION INTERNAL FIXATION FEMORAL SHAFT FRACTURE;  Surgeon: Shona Needles, MD;  Location: [place];  Service: Orthopedics;  Laterality: Right;    Family History  Problem Relation Age of Onset   Cancer Mother    Diabetes Mother    Breast cancer Sister     Social History   Socioeconomic History   Marital status: Widowed    Spouse name: Not on file   Number of children: Not on file   Years of education: Not on file   Highest education level: Not on file  Occupational History   Not on file  Tobacco Use   Smoking status: Every Day    Packs/day: 1.00    Years: 62.00    Total pack years: 62.00    Types: Cigarettes, Cigars   Smokeless tobacco: Never  Vaping  Use   Vaping Use: Never used  Substance and Sexual Activity   Alcohol use: Yes    Alcohol/week: 5.0 standard drinks of alcohol    Types: 5 Standard drinks or equivalent per week    Comment: 5th of rum a week or 1 drink a day   Drug use: No   Sexual activity: Not Currently  Other Topics Concern   Not on file  Social History Narrative   Not on file   Social Determinants of Health   Financial Resource Strain: Low Risk  (11/10/2021)   Overall Financial Resource Strain (CARDIA)    Difficulty of Paying Living Expenses: Not hard at all  Food Insecurity: No Food Insecurity (11/10/2021)   Hunger Vital Sign    Worried About Running Out of Food in the Last Year: Never true    Ran Out of Food in the Last Year: Never true  Transportation Needs: No Transportation Needs (11/10/2021)   PRAPARE - Hydrologist (Medical): No    Lack of Transportation (Non-Medical): No  Physical Activity: Inactive (11/10/2021)   Exercise Vital Sign    Days of Exercise per Week: 0 days    Minutes of Exercise per Session: 0 min  Stress: No Stress Concern Present (11/10/2021)   [place]    Feeling of Stress : Not at all  Social Connections: Moderately Isolated (11/10/2021)   Social Connection and Isolation Panel [NHANES]     Frequency of Communication with Friends and Family: More than three times a week    Frequency of Social Gatherings with Friends and Family: More than three times a week    Attends Religious Services: More than 4 times per year    Active Member of [employer] or Organizations: No    Attends Archivist Meetings: Never    Marital Status: Widowed  Intimate Partner Violence: Not At Risk (11/10/2021)   Humiliation, Afraid, Rape, and Kick questionnaire    Fear of Current or Ex-Partner: No    Emotionally Abused: No    Physically Abused: No    Sexually Abused: No    Review of Systems  Constitutional:  Negative for chills, fever and malaise/fatigue.  HENT:  Negative for ear pain, sinus pain and sore throat.   Respiratory:  Positive for cough and shortness of breath (Had to use inhaler after walking from lobby to room.).   Cardiovascular:  Negative for chest pain.  Musculoskeletal:  Negative for myalgias.  Neurological:  Negative for headaches.       Objective    BP 128/74   Pulse 91   Temp (!) 97.4 F (36.3 C)   Ht '5\' 9"'  (1.753 m)   Wt 199 lb (90.3 kg)   SpO2 92% Comment: O2 was 88, patient declined supplemental O2 while here in the office.  BMI 29.39 kg/m   Physical Exam Constitutional:      Appearance: He is ill-appearing.     Comments: Ambulates with cane  HENT:     Mouth/Throat:     Mouth: Mucous membranes are moist.  Eyes:     Comments: Glasses in place  Neck:     Vascular: No carotid bruit.  Cardiovascular:     Rate and Rhythm: Normal rate. Rhythm irregular.     Pulses: Normal pulses.     Heart sounds: Normal heart sounds. No murmur heard. Pulmonary:     Comments: Diminished lung sounds in all lobes Abdominal:  General: Bowel sounds are normal.     Palpations: Abdomen is soft. There is no mass.     Tenderness: There is no abdominal tenderness.  Musculoskeletal:        General: No swelling.  Skin:    Capillary Refill: Capillary refill takes less than  2 seconds.     Findings: Bruising present.  Neurological:     General: No focal deficit present.     Mental Status: He is alert and oriented to person, place, and time.  Psychiatric:        Mood and Affect: Mood normal.        Behavior: Behavior normal.        Assessment & Plan:

## 2021-11-23 ENCOUNTER — Ambulatory Visit: Payer: Medicare Other

## 2021-12-16 ENCOUNTER — Ambulatory Visit (INDEPENDENT_AMBULATORY_CARE_PROVIDER_SITE_OTHER): Payer: Medicare Other | Admitting: Nurse Practitioner

## 2021-12-16 ENCOUNTER — Encounter: Payer: Self-pay | Admitting: Nurse Practitioner

## 2021-12-16 VITALS — BP 118/64 | HR 107 | Temp 97.5°F | Ht 69.0 in | Wt 208.0 lb

## 2021-12-16 DIAGNOSIS — I5023 Acute on chronic systolic (congestive) heart failure: Secondary | ICD-10-CM | POA: Diagnosis not present

## 2021-12-16 DIAGNOSIS — Z9981 Dependence on supplemental oxygen: Secondary | ICD-10-CM | POA: Diagnosis not present

## 2021-12-16 DIAGNOSIS — J449 Chronic obstructive pulmonary disease, unspecified: Secondary | ICD-10-CM | POA: Diagnosis not present

## 2021-12-16 DIAGNOSIS — J9611 Chronic respiratory failure with hypoxia: Secondary | ICD-10-CM | POA: Diagnosis not present

## 2021-12-16 DIAGNOSIS — Z7951 Long term (current) use of inhaled steroids: Secondary | ICD-10-CM

## 2021-12-16 LAB — HEPATIC FUNCTION PANEL
ALT: 46 U/L — AB (ref 10–40)
AST: 41 — AB (ref 14–40)
Alkaline Phosphatase: 82 (ref 25–125)
Bilirubin, Total: 0.6

## 2021-12-16 LAB — BASIC METABOLIC PANEL
BUN: 13 (ref 4–21)
CO2: 29 — AB (ref 13–22)
Chloride: 94 — AB (ref 99–108)
Creatinine: 0.7 (ref 0.6–1.3)
Glucose: 93
Potassium: 4.2 mEq/L (ref 3.5–5.1)
Sodium: 129 — AB (ref 137–147)

## 2021-12-16 LAB — CBC AND DIFFERENTIAL
HCT: 34 — AB (ref 41–53)
Hemoglobin: 10.9 — AB (ref 13.5–17.5)
Platelets: 267 10*3/uL (ref 150–400)
WBC: 11.2

## 2021-12-16 LAB — COMPREHENSIVE METABOLIC PANEL
Albumin: 3.9 (ref 3.5–5.0)
Calcium: 9.2 (ref 8.7–10.7)
eGFR: >60

## 2021-12-16 LAB — CBC: RBC: 3.92 (ref 3.87–5.11)

## 2021-12-16 NOTE — Progress Notes (Signed)
Acute Office Visit  Subjective:    Patient ID: Kyle Farley, male    DOB: 02-23-1940, 81 y.o.   MRN: [id number]  CC: Dyspnea  HPI: Patient is in today for bilateral pedal and lower extremity edema. Onset was a few days ago. He denies medical history of HF, however pt is a poor historian. EMR reports history of CHF. He is followed by Dr Bettina Gavia, cardiologist. Reports he takes Lasix 20 mg QD.  He tells me he was d/c from [facility] on 11/ 20/23 for COPD exacerbation. He has chronic orthopnea, sleep in recliner, wears O2 at 2L/min at night. Current cigarette smoker. States he's too old to quit b/c he started at age 89. l Past Medical History:  Diagnosis Date   Alcohol use 09/12/2020   Arthritis    CHF (congestive heart failure) (HCC)    Cigarette smoker 10/21/2014   COPD (chronic obstructive pulmonary disease) (HCC)    COPD exacerbation (HCC) 01/24/2017   Dyspnea    Dysrhythmia    atrial fibrillation   Essential hypertension 10/21/2014   Femur fracture, right ([place]) 09/12/2020   GERD (gastroesophageal reflux disease)    History of kidney stones    Hyperlipidemia 01/24/2017   Hypertension    Kidney stones 01/24/2017   Paroxysmal atrial fibrillation ([place]) 10/21/2014   Pressure injury of skin 09/16/2020    Past Surgical History:  Procedure Laterality Date   LEFT HEART CATH AND CORONARY ANGIOGRAPHY N/A 09/15/2020   Procedure: LEFT HEART CATH AND CORONARY ANGIOGRAPHY;  Surgeon: Sherren Mocha, MD;  Location: [place] [facility];  Service: Cardiovascular;  Laterality: N/A;   LEG SURGERY Right    steal pin placed in right leg 35 years ago   ORIF FEMUR FRACTURE Right 09/15/2020   Procedure: OPEN REDUCTION INTERNAL FIXATION FEMORAL SHAFT FRACTURE;  Surgeon: Shona Needles, MD;  Location: [place];  Service: Orthopedics;  Laterality: Right;    Family History  Problem Relation Age of Onset   Cancer Mother    Diabetes Mother    Breast cancer Sister     Social History    Socioeconomic History   Marital status: Widowed    Spouse name: Not on file   Number of children: Not on file   Years of education: Not on file   Highest education level: Not on file  Occupational History   Not on file  Tobacco Use   Smoking status: Every Day    Packs/day: 1.00    Years: 62.00    Total pack years: 62.00    Types: Cigarettes, Cigars   Smokeless tobacco: Never  Vaping Use   Vaping Use: Never used  Substance and Sexual Activity   Alcohol use: Yes    Alcohol/week: 5.0 standard drinks of alcohol    Types: 5 Standard drinks or equivalent per week    Comment: 5th of rum a week or 1 drink a day   Drug use: No   Sexual activity: Not Currently  Other Topics Concern   Not on file  Social History Narrative   Not on file   Social Determinants of Health   Financial Resource Strain: Low Risk  (11/10/2021)   Overall Financial Resource Strain (CARDIA)    Difficulty of Paying Living Expenses: Not hard at all  Food Insecurity: No Food Insecurity (11/10/2021)   Hunger Vital Sign    Worried About Running Out of Food in the Last Year: Never true    Ran Out of Food in the  Last Year: Never true  Transportation Needs: No Transportation Needs (11/10/2021)   PRAPARE - Hydrologist (Medical): No    Lack of Transportation (Non-Medical): No  Physical Activity: Inactive (11/10/2021)   Exercise Vital Sign    Days of Exercise per Week: 0 days    Minutes of Exercise per Session: 0 min  Stress: No Stress Concern Present (11/10/2021)   [place]    Feeling of Stress : Not at all  Social Connections: Moderately Isolated (11/10/2021)   Social Connection and Isolation Panel [NHANES]    Frequency of Communication with Friends and Family: More than three times a week    Frequency of Social Gatherings with Friends and Family: More than three times a week    Attends Religious Services: More  than 4 times per year    Active Member of [employer] or Organizations: No    Attends Archivist Meetings: Never    Marital Status: Widowed  Intimate Partner Violence: Not At Risk (11/10/2021)   Humiliation, Afraid, Rape, and Kick questionnaire    Fear of Current or Ex-Partner: No    Emotionally Abused: No    Physically Abused: No    Sexually Abused: No    Outpatient Medications Prior to Visit  Medication Sig Dispense Refill   albuterol ([name] HFA;VENTOLIN HFA) 108 (90 Base) MCG/ACT inhaler Inhale 2 puffs into the lungs every 4 (four) hours as needed for wheezing or shortness of breath.     apixaban (ELIQUIS) 5 MG TABS tablet Take 1 tablet (5 mg total) by mouth 2 (two) times daily. 60 tablet 11   atorvastatin (LIPITOR) 40 MG tablet Take 40 mg by mouth daily.     budesonide (PULMICORT) 0.5 MG/2ML nebulizer solution Take 0.5 mg by nebulization 2 (two) times daily.     cloNIDine (CATAPRES) 0.1 MG tablet TAKE 1 TABLET BY MOUTH ONCE DAILY AS NEEDED FOR  BLOOD  PRESSURE  OVER  160/100     furosemide (LASIX) 20 MG tablet Take 1 tablet (20 mg total) by mouth every other day. 45 tablet 1   ipratropium-albuterol (DUONEB) 0.5-2.5 (3) MG/3ML SOLN Inhale 3 mLs into the lungs every 6 (six) hours as needed for shortness of breath or wheezing.     levothyroxine (SYNTHROID) 50 MCG tablet Take 1 tablet (50 mcg total) by mouth daily before breakfast. 30 tablet 1   lisinopril (ZESTRIL) 40 MG tablet Take 40 mg by mouth daily at 12 noon.     Magnesium 400 MG CAPS Take by mouth.     metoprolol succinate (TOPROL-XL) 50 MG 24 hr tablet Take 1 tablet (50 mg total) by mouth daily. Take with or immediately following a meal. 30 tablet 1   montelukast (SINGULAIR) 10 MG tablet Take 10 mg by mouth daily at 12 noon.     oxyCODONE (OXY IR/ROXICODONE) 5 MG immediate release tablet Take 1 tablet (5 mg total) by mouth every 4 (four) hours as needed for severe pain. 30 tablet 0   pantoprazole (PROTONIX) 40 MG tablet  Take 1 tablet by mouth daily.     potassium chloride SA (KLOR-CON) 20 MEQ tablet Take 20 mEq by mouth daily.     tamsulosin (FLOMAX) 0.4 MG CAPS capsule Take 0.4 mg by mouth daily.     TRELEGY ELLIPTA 100-62.5-25 MCG/ACT AEPB Inhale 1 puff into the lungs daily.     Vitamin D, Ergocalciferol, (DRISDOL) 1.25 MG (50000 UNIT) CAPS capsule Take  50,000 Units by mouth 3 (three) times a week.     No facility-administered medications prior to visit.    Allergies  Allergen Reactions   Diltiazem     Other reaction(s): Unknown    Review of Systems  Constitutional:  Positive for fatigue.  Respiratory:  Positive for cough (chronic) and shortness of breath. Negative for chest tightness.   Cardiovascular:  Positive for palpitations and leg swelling. Negative for chest pain.       Objective:    Physical Exam Vitals reviewed.  Constitutional:      Appearance: He is ill-appearing.  Cardiovascular:     Rate and Rhythm: Tachycardia present. Rhythm irregular.  Pulmonary:     Effort: Pulmonary effort is normal.     Comments: Diminished lung sounds in all lobes Abdominal:     General: Bowel sounds are normal.     Palpations: Abdomen is soft.  Musculoskeletal:        General: Swelling present.     Right lower leg: No tenderness. 3+ Edema present.     Left lower leg: No tenderness. 3+ Edema present.  Feet:     Right foot:     Skin integrity: Erythema and dry skin present.     Left foot:     Skin integrity: Erythema and dry skin present.  Skin:    Findings: Erythema present.       Neurological:     General: No focal deficit present.     Mental Status: He is alert and oriented to person, place, and time.     BP 118/64   Pulse (!) 107   Temp (!) 97.5 F (36.4 C)   Ht _0  (1.753 m)   Wt 208 lb (94.3 kg)   SpO2 95%   BMI 30.72 kg/m   Wt Readings from Last 3 Encounters:  11/10/21 199 lb (90.3 kg)  10/14/21 195 lb (88.5 kg)  09/20/20 170 lb 10.2 oz (77.4 kg)    Health  Maintenance Due  Topic Date Due   Zoster Vaccines- Shingrix (1 of 2) Never done   Lung Cancer Screening  Never done   Medicare Annual Wellness (AWV)  09/05/2019       Lab Results  Component Value Date   TSH 14.000 (H) 10/14/2021   Lab Results  Component Value Date   WBC 10.4 09/20/2020   HGB 9.0 (L) 09/20/2020   HCT 27.8 (L) 09/20/2020   MCV 88.2 09/20/2020   PLT 273 09/20/2020   Lab Results  Component Value Date   NA 124 (L) 10/14/2021   K 4.3 10/14/2021   CO2 24 10/14/2021   GLUCOSE 113 (H) 10/14/2021   BUN 6 (L) 10/14/2021   CREATININE 0.83 10/14/2021   BILITOT 0.4 10/14/2021   ALKPHOS 121 10/14/2021   AST 35 10/14/2021   ALT 28 10/14/2021   PROT 7.0 10/14/2021   ALBUMIN 4.3 10/14/2021   CALCIUM 9.3 10/14/2021   ANIONGAP 7 09/18/2020   EGFR 88 10/14/2021   Lab Results  Component Value Date   CHOL 188 10/14/2021   Lab Results  Component Value Date   HDL 92 10/14/2021   Lab Results  Component Value Date   LDLCALC 80 10/14/2021   Lab Results  Component Value Date   TRIG 93 10/14/2021   Lab Results  Component Value Date   CHOLHDL 2.0 10/14/2021        Assessment & Plan:   1. 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  I, Rip Harbour, NP, have reviewed all documentation for this visit. The documentation on 12/16/21 for the exam, diagnosis, procedures, and orders are all accurate and complete.   Signed, Rip Harbour, NP [place] [phone]

## 2021-12-16 NOTE — Patient Instructions (Signed)
Increase Lasix to 20 mg twice daily Obtain stat lab work at [place] appt with Dr Bettina Gavia Follow-up pending lab results   Heart Failure Exacerbation  Heart failure is a condition in which the heart has trouble pumping blood. This may mean that the heart cannot pump enough blood out to the body or that the heart does not fill up with enough blood. When this happens, parts of the body do not get the blood and oxygen they need to function properly. This can cause symptoms such as breathing problems, tiredness (fatigue), swelling, and confusion. Heart failure exacerbation refers to heart failure symptoms that get worse. The symptoms may get worse suddenly or develop slowly over time. Heart failure exacerbation is a serious medical problem that should be treated right away. What are the causes? A heart failure exacerbation can be triggered by: Not taking your heart failure medicines correctly. Infections. Eating an unhealthy diet or a diet that is high in salt (sodium). Drinking too much fluid. Drinking alcohol. Using drugs, such as cocaine or methamphetamine. Not exercising. Other causes include: Other heart conditions such as an irregular heart rhythm (arrhythmia). Worsening heart valve function. Low blood counts (anemia). Other medical problems, such as kidney failure, thyroid problems, or diabetes mellitus. Sometimes the cause of the exacerbation is not known. What are the signs or symptoms? When heart failure symptoms suddenly or slowly get worse, this may be a sign of heart failure exacerbation. Symptoms of heart failure include: Shortness of breath during activity or exercise. A cough that does not go away. Swelling of the legs, ankles, feet, or abdomen. Losing or gaining weight for no reason. Trouble breathing when lying down. Increased heart rate or irregular heartbeat. Fatigue. Feeling light-headed, dizzy, or close to fainting. Nausea or lack of appetite. How is  this diagnosed? This condition is diagnosed based on: Your symptoms and medical history. A physical exam. You may also have tests, including: Electrocardiogram (ECG). This test measures the electrical activity of your heart. Echocardiogram. This test uses sound waves to take a picture of your heart to see how well it works. Blood tests. Imaging tests, such as: Chest X-ray. MRI. Ultrasound. Stress test. This test examines how well your heart functions while you exercise on a treadmill or exercise bike. If you cannot exercise, medicines may be used to increase your heartbeat in place of exercise. Cardiac catheterization. During this test, a thin, flexible tube (catheter) is inserted into a blood vessel and threaded up to your heart. This test allows your health care provider to check the arteries that lead to your heart (coronary arteries). Right heart catheterization. During this test, the pressure in your heart is measured. How is this treated? This condition may be treated by: Adjusting your heart medicines. Maintaining a healthy lifestyle. This includes: Eating a heart-healthy diet that is low in sodium. Not using products that contain nicotine or tobacco. Regular exercise. Monitoring your fluid intake. Monitoring your weight and reporting changes to your health care provider. Not using alcohol or drugs. Treating sleep apnea, if you have this condition. Surgery. This may include: Placing a pacemaker to improve heart function (cardiac resynchronization therapy). Implanting a device that can correct heart rhythm problems (implantable cardioverter defibrillator). Implanting a pulmonary arterial pressure monitor to monitor your fluid balance. Connecting a device to your heart to help it pump blood (ventricular assist device). Heart transplant. Follow these instructions at home: Medicines Take over-the-counter and prescription medicines only as told by your health care provider. Do  not stop taking your medicines or change the amount you take. If you are having problems or side effects from your medicines, talk to your health care provider. If you are having difficulty paying for your medicines, contact a social worker or your clinic. There are many programs to assist with medicine costs. Talk to your health care provider before starting any new medicines or supplements. Make sure your health care provider and pharmacist have a list of all the medicines you are taking. Eating and drinking  Avoid drinking alcohol. Eat a heart-healthy diet as told by your health care provider. This includes: Plenty of fruits and vegetables. Lean proteins. Low-fat dairy. Whole grains. Foods that are low in sodium. Activity  Exercise regularly as told by your health care provider. Balance exercise with rest. Ask your health care provider what activities are safe for you. This includes sexual activity, exercise, and daily tasks at home or work. Lifestyle Do not use any products that contain nicotine or tobacco. These products include cigarettes, chewing tobacco, and vaping devices, such as e-cigarettes. If you need help quitting, ask your health care provider. Maintain a healthy weight. Ask your health care provider what weight is healthy for you. Consider joining a patient support group. This can help with emotional problems you may have, such as stress and anxiety. Do not use drugs. General instructions Stay up to date with vaccines. Talk to your health care provider about flu and pneumonia vaccines. Keep a list of medicines that you are taking. This may help in emergency situations. Keep all follow-up visits. This is important. Contact a health care provider if: You have questions about your medicines or you miss a dose. You feel anxious, depressed, or stressed. You develop swelling in your feet, ankles, legs, or abdomen. You develop a cough. You have a fever. You have trouble  sleeping. You gain 2-3 lb (1-1.4 kg) in 24 hours or 5 lb (2.3 kg) in a week. Get help right away if: You have chest pain or pressure. You have shortness of breath while resting. You have severe fatigue. You are confused. You have severe dizziness. You have a rapid or irregular heartbeat. You have nausea or you vomit. You have a cough that is worse at night or you cannot lie flat. You have severe depression or sadness. These symptoms may represent a serious problem that is an emergency. Do not wait to see if the symptoms will go away. Get medical help right away. Call your local emergency services (911 in the [place] Do not drive yourself to the hospital. Summary When heart failure symptoms get worse, it is called heart failure exacerbation. Common causes of this condition include taking medicines incorrectly, infections, and drinking alcohol. This condition may be treated by adjusting medicines, maintaining a healthy lifestyle, or surgery. Do not stop taking your medicines or change the amount you take. If you are having problems or side effects from your medicines, talk to your health care provider. This information is not intended to replace advice given to you by your health care provider. Make sure you discuss any questions you have with your health care provider. Document Revised: 04/14/2021 Document Reviewed: 07/28/2019 Elsevier Patient Education  [place].

## 2021-12-17 ENCOUNTER — Encounter: Payer: Self-pay | Admitting: Nurse Practitioner

## 2021-12-21 NOTE — Progress Notes (Unsigned)
Office Visit    Patient Name: Kyle Farley Date of Encounter: 12/22/2021  Primary Care Provider:  Rip Harbour, NP Primary Cardiologist:  Kyle More, MD Primary Electrophysiologist: None  Chief Complaint    Kyle Farley is a 81 y.o. male with PMH of PAF, AAA, COPD, HFrEF, HLD, aortic valve stenosis, mild CAD who presents today for complaint of lower extremity edema.  Past Medical History    Past Medical History:  Diagnosis Date   Alcohol use 09/12/2020   Arthritis    CHF (congestive heart failure) ([place])    Cigarette smoker 10/21/2014   COPD (chronic obstructive pulmonary disease) ([facility])    COPD exacerbation ([place]) 01/24/2017   Dyspnea    Dysrhythmia    atrial fibrillation   Essential hypertension 10/21/2014   Femur fracture, right ([place]) 09/12/2020   GERD (gastroesophageal reflux disease)    History of kidney stones    Hyperlipidemia 01/24/2017   Hypertension    Kidney stones 01/24/2017   Paroxysmal atrial fibrillation ([place]) 10/21/2014   Pressure injury of skin 09/16/2020   Past Surgical History:  Procedure Laterality Date   LEFT HEART CATH AND CORONARY ANGIOGRAPHY N/A 09/15/2020   Procedure: LEFT HEART CATH AND CORONARY ANGIOGRAPHY;  Surgeon: Sherren Mocha, MD;  Location: [facility];  Service: Cardiovascular;  Laterality: N/A;   LEG SURGERY Right    steal pin placed in right leg 35 years ago   ORIF FEMUR FRACTURE Right 09/15/2020   Procedure: OPEN REDUCTION INTERNAL FIXATION FEMORAL SHAFT FRACTURE;  Surgeon: Shona Needles, MD;  Location: [facility];  Service: Orthopedics;  Laterality: Right;    Allergies  Allergies  Allergen Reactions   Diltiazem     Other reaction(s): Unknown    History of Present Illness    Kyle Farley  is a 81 year old male with the above mention past medical history who presents today for lower extremity edema.  He was initially seen by Kyle Farley and 2021 for management of atrial flutter. He was previously followed by Kyle Farley  cardiology and was diagnosed with paroxysmal AF and placed on amiodarone which was discontinued due to lung disease.  He was placed on flecainide and was told to restart amiodarone due to chest pain.  He was seen in 2022 after suffering a mechanical fall at home.  He was dealing with gait instability and also experienced intermittent lower extremity edema.  He also endorsed substernal chest tightness that gets worse with minimal walking.  2D echo was completed showing EF of 40-45%, global hypokinesis, moderately dilated LA, mild mitral regurgitation.  Left heart cath was ordered and revealed nonobstructive CAD.  He underwent surgical repair of his femur due to mechanical fall.  He was last seen 10/14/2021 for follow-up.  Blood pressures were running in the 140s and ACE inhibitor was increased.  He is no longer on amiodarone due to lung toxicity.  He continues to smoke a pack and a half of cigarettes per day and has home O2.  Kyle Farley presents today with his son for complaint of lower extremity edema.  Since last being seen in the office patient reports that he is experienced increased edema in his lower extremities along with increased shortness of breath.  His blood pressures today were well-controlled at 120/62 and heart rate was elevated 105 bpm.  He is compliant with his medications however he has been taking amiodarone after having it discontinued by Kyle Farley in September.  He also discontinued  Toprol-XL which has possibly driven his elevated heart rate.  During the visit he denied any chest pain or dizziness with his lower extremity edema.  He does report shortness of breath with minimal exertion that is new since his edema has developed.  Patient denies chest pain, palpitations, dyspnea, PND, orthopnea, nausea, vomiting, dizziness, syncope, edema, weight gain, or early satiety.    Home Medications    Current Outpatient Medications  Medication Sig Dispense Refill   albuterol ([name] HFA;VENTOLIN  HFA) 108 (90 Base) MCG/ACT inhaler Inhale 2 puffs into the lungs every 4 (four) hours as needed for wheezing or shortness of breath.     apixaban (ELIQUIS) 5 MG TABS tablet Take 1 tablet (5 mg total) by mouth 2 (two) times daily. 60 tablet 11   budesonide (PULMICORT) 0.5 MG/2ML nebulizer solution Take 0.5 mg by nebulization 2 (two) times daily.     cloNIDine (CATAPRES) 0.1 MG tablet TAKE 1 TABLET BY MOUTH ONCE DAILY AS NEEDED FOR  BLOOD  PRESSURE  OVER  637/858     folic acid (FOLVITE) 1 MG tablet Take 1 mg by mouth daily.     ipratropium-albuterol (DUONEB) 0.5-2.5 (3) MG/3ML SOLN Inhale 3 mLs into the lungs every 6 (six) hours as needed for shortness of breath or wheezing.     levothyroxine (SYNTHROID) 50 MCG tablet Take 1 tablet (50 mcg total) by mouth daily before breakfast. 30 tablet 1   lisinopril (ZESTRIL) 40 MG tablet Take 20 mg by mouth daily at 12 noon.     Magnesium 400 MG CAPS Take by mouth.     montelukast (SINGULAIR) 10 MG tablet Take 10 mg by mouth daily at 12 noon.     potassium chloride SA (KLOR-CON) 20 MEQ tablet Take 20 mEq by mouth daily.     tamsulosin (FLOMAX) 0.4 MG CAPS capsule Take 0.4 mg by mouth daily.     thiamine (VITAMIN B1) 100 MG tablet Take 100 mg by mouth daily.     torsemide (DEMADEX) 20 MG tablet Take 1 tablet (20 mg total) by mouth as directed. 60 tablet 0   TRELEGY ELLIPTA 100-62.5-25 MCG/ACT AEPB Inhale 1 puff into the lungs daily.     atorvastatin (LIPITOR) 40 MG tablet Take 1 tablet (40 mg total) by mouth daily. 90 tablet 3   metoprolol succinate (TOPROL-XL) 25 MG 24 hr tablet Take 1 tablet (25 mg total) by mouth daily. Take with or immediately following a meal. 30 tablet 2   No current facility-administered medications for this visit.     Review of Systems  Please see the history of present illness.    (+) Chronic shortness of breath (+) Lower extremity edema  All other systems reviewed and are otherwise negative except as noted above.  Physical  Exam    Wt Readings from Last 3 Encounters:  12/22/21 203 lb (92.1 kg)  12/16/21 208 lb (94.3 kg)  11/10/21 199 lb (90.3 kg)   VS: Vitals:   12/22/21 1000  BP: 120/62  Pulse: (!) 105  SpO2: 95%  ,Body mass index is 29.98 kg/m.  Constitutional:      Appearance: Healthy appearance. Not in distress.  Neck:     Vascular: JVD normal.  Pulmonary:     Effort: Pulmonary effort is labored with cough    Breath sounds:  wheezing present. No rales. Diminished in the bases Cardiovascular:     Irregular regular normal S1. Normal S2.      Murmurs: There is no murmur.  Edema:    +2 pitting edema bilateral Abdominal:     Palpations: Abdomen is soft non tender. There is no hepatomegaly.  Skin:    General: Skin is warm and dry.  Neurological:     General: No focal deficit present.     Mental Status: Alert and oriented to person, place and time.     Cranial Nerves: Cranial nerves are intact.  EKG/LABS/Other Studies Reviewed    ECG personally reviewed by me today -sinus tach with rate of 106 bpm and right bundle branch block with no acute changes and QTc of 555  Risk Assessment/Calculations:    CHA2DS2-VASc Score = 5   This indicates a 7.2% annual risk of stroke. The patient's score is based upon: CHF History: 1 HTN History: 1 Diabetes History: 0 Stroke History: 0 Vascular Disease History: 1 Age Score: 2 Gender Score: 0       Lab Results  Component Value Date   WBC 11.2 12/16/2021   HGB 10.9 (A) 12/16/2021   HCT 34 (A) 12/16/2021   MCV 88.2 09/20/2020   PLT 267 12/16/2021   Lab Results  Component Value Date   CREATININE 0.7 12/16/2021   BUN 13 12/16/2021   NA 129 (A) 12/16/2021   K 4.2 12/16/2021   CL 94 (A) 12/16/2021   CO2 29 (A) 12/16/2021   Lab Results  Component Value Date   ALT 46 (A) 12/16/2021   AST 41 (A) 12/16/2021   ALKPHOS 82 12/16/2021   BILITOT 0.4 10/14/2021   Lab Results  Component Value Date   CHOL 188 10/14/2021   HDL 92 10/14/2021    LDLCALC 80 10/14/2021   TRIG 93 10/14/2021   CHOLHDL 2.0 10/14/2021    No results found for: "HGBA1C"  Assessment & Plan    1.  HFrEF: -2D echo completed 2022 with EF of 40-45%, global hypokinesis, moderately dilated LA, mild mitral regurgitation.  -NYHA II-III with +2 pitting edema bilaterally noted. -He currently has chronic shortness of breath but does note increased shortness of breath with activity. -Patient would benefit from addition of SGLT2 once volume status is balanced -He was started on 20 mg twice daily by his PCP -We will discontinue Lasix and start torsemide 20 mg twice daily x 3 days and then 20 mg as needed -BMET and BNP today -We will repeat BMET and magnesium in 2 weeks -Low sodium diet, fluid restriction <2L, and daily weights encouraged. Educated to contact our office for weight gain of 2 lbs overnight or 5 lbs in one week.   2.  Persistent atrial fibrillation: -Today patient is 2:1 atrial flutter -He was advised by Kyle Farley during previous visit to discontinue amiodarone.  He unfortunately was confused and continue to take amiodarone but discontinued Toprol-XL 50 mg. -We will discontinue amiodarone 200 mg BID today -Restart Toprol-XL 25 mg daily -Continue Eliquis 5 mg twice daily  3.  Nonobstructive CAD: -LHC performed with 2022 with nonobstructive CAD noted -Today patient reports no chest pain associated with shortness of breath -Continue GDMT with Lipitor 40 mg  4.  Dilated cardiomyopathy: -2D echo completed 08/2021 with EF of 50-55% and severely dilated LA, mildly enlarged RA with trace aortic regurgitation  5.  History of COPD: -Patient currently on DuoNeb and Trelegy -Patient is currently followed by pulmonology   Disposition: Follow-up with Kyle More, MD or APP as scheduled    Medication Adjustments/Labs and Tests Ordered: Current medicines are reviewed at length with the patient today.  Concerns  regarding medicines are outlined above.    Signed, Mable Fill, Marissa Nestle, NP 12/22/2021, 11:32 AM [facility]  Note:  This document was prepared using Dragon voice recognition software and may include unintentional dictation errors.

## 2021-12-22 ENCOUNTER — Ambulatory Visit: Payer: Medicare Other | Attending: Nurse Practitioner | Admitting: Nurse Practitioner

## 2021-12-22 ENCOUNTER — Encounter: Payer: Self-pay | Admitting: Nurse Practitioner

## 2021-12-22 VITALS — BP 120/62 | HR 105 | Ht 69.0 in | Wt 203.0 lb

## 2021-12-22 DIAGNOSIS — I480 Paroxysmal atrial fibrillation: Secondary | ICD-10-CM

## 2021-12-22 DIAGNOSIS — J449 Chronic obstructive pulmonary disease, unspecified: Secondary | ICD-10-CM

## 2021-12-22 DIAGNOSIS — I420 Dilated cardiomyopathy: Secondary | ICD-10-CM

## 2021-12-22 DIAGNOSIS — I10 Essential (primary) hypertension: Secondary | ICD-10-CM

## 2021-12-22 DIAGNOSIS — I5023 Acute on chronic systolic (congestive) heart failure: Secondary | ICD-10-CM | POA: Diagnosis not present

## 2021-12-22 DIAGNOSIS — Z7951 Long term (current) use of inhaled steroids: Secondary | ICD-10-CM

## 2021-12-22 MED ORDER — TORSEMIDE 20 MG PO TABS: 20.0000 mg | ORAL_TABLET | ORAL | 0 refills | Status: DC

## 2021-12-22 MED ORDER — METOPROLOL SUCCINATE ER 50 MG PO TB24: 50.0000 mg | ORAL_TABLET | Freq: Every day | ORAL | 1 refills | Status: DC

## 2021-12-22 MED ORDER — ATORVASTATIN CALCIUM 40 MG PO TABS: 40.0000 mg | ORAL_TABLET | Freq: Every day | ORAL | 3 refills | Status: DC

## 2021-12-22 MED ORDER — METOPROLOL SUCCINATE ER 25 MG PO TB24: 25.0000 mg | ORAL_TABLET | Freq: Every day | ORAL | 2 refills | Status: DC

## 2021-12-22 NOTE — Patient Instructions (Signed)
Medication Instructions:  STOP Amiodarone  STOP Lasix START Torsemide '20mg'$  Take 1 tablet twice a day for 3 days then as needed START Toprol XL '25MG'$  TAKE 1 TABLET ONCE A DAY  *If you need a refill on your cardiac medications before your next appointment, please call your pharmacy*   Lab Work: TODAY-BMET & BNP 2 WEEKS BMET & MAG If you have labs (blood work) drawn today and your tests are completely normal, you will receive your results only by: MyChart Message (if you have MyChart) OR A paper copy in the mail If you have any lab test that is abnormal or we need to change your treatment, we will call you to review the results.   Testing/Procedures: NONE ORDERED   Follow-Up: At [facility], you and your health needs are our priority.  As part of our continuing mission to provide you with exceptional heart care, we have created designated Provider Care Teams.  These Care Teams include your primary Cardiologist (physician) and Advanced Practice Providers (APPs -  Physician Assistants and Nurse Practitioners) who all work together to provide you with the care you need, when you need it.  We recommend signing up for the patient portal called "MyChart".  Sign up information is provided on this After Visit Summary.  MyChart is used to connect with patients for Virtual Visits (Telemedicine).  Patients are able to view lab/test results, encounter notes, upcoming appointments, etc.  Non-urgent messages can be sent to your provider as well.   To learn more about what you can do with MyChart, go to [email or web address].    Your next appointment:   Follow up as scheduled  The format for your next appointment:   In Person  Provider:   Shirlee More, MD    Other Instructions   Important Information About Sugar      DISCONTINUE

## 2021-12-23 LAB — BASIC METABOLIC PANEL
BUN/Creatinine Ratio: 11 (ref 10–24)
BUN: 10 mg/dL (ref 8–27)
CO2: 25 mmol/L (ref 20–29)
Calcium: 9.5 mg/dL (ref 8.6–10.2)
Chloride: 92 mmol/L — ABNORMAL LOW (ref 96–106)
Creatinine, Ser: 0.89 mg/dL (ref 0.76–1.27)
Glucose: 117 mg/dL — ABNORMAL HIGH (ref 70–99)
Potassium: 5.2 mmol/L (ref 3.5–5.2)
Sodium: 131 mmol/L — ABNORMAL LOW (ref 134–144)
eGFR: 87 mL/min/{1.73_m2} (ref >59)

## 2021-12-23 LAB — PRO B NATRIURETIC PEPTIDE: NT-Pro BNP: 639 pg/mL — ABNORMAL HIGH (ref 0–486)

## 2021-12-24 ENCOUNTER — Telehealth: Payer: Self-pay | Admitting: Cardiology

## 2021-12-24 NOTE — Telephone Encounter (Signed)
Contacted the patient and reviewed lab results. Patient agreeable and voiced understanding.

## 2021-12-24 NOTE — Telephone Encounter (Signed)
Patient is returning call to discuss lab results. 

## 2022-01-04 ENCOUNTER — Ambulatory Visit: Payer: Medicare Other | Attending: Cardiology | Admitting: Cardiology

## 2022-01-04 ENCOUNTER — Encounter: Payer: Self-pay | Admitting: Cardiology

## 2022-01-04 VITALS — BP 112/72 | HR 96 | Ht 69.0 in | Wt 198.0 lb

## 2022-01-04 DIAGNOSIS — I4819 Other persistent atrial fibrillation: Secondary | ICD-10-CM

## 2022-01-04 DIAGNOSIS — D6869 Other thrombophilia: Secondary | ICD-10-CM | POA: Diagnosis not present

## 2022-01-04 MED ORDER — METOPROLOL SUCCINATE ER 50 MG PO TB24: 50.0000 mg | ORAL_TABLET | Freq: Every day | ORAL | 6 refills | Status: DC

## 2022-01-04 NOTE — Progress Notes (Signed)
Electrophysiology Office Note   Date:  01/04/2022   ID:  Kyle Farley, DOB 1940-10-24, MRN [id number]  PCP:  Rip Harbour, NP  Cardiologist:  Bettina Gavia Primary Electrophysiologist:  Jaki Hammerschmidt Meredith Leeds, MD    Chief Complaint: AF   History of Present Illness: Kyle Farley is a 81 y.o. male who is being seen today for the evaluation of AF at the request of Bettina Gavia, Hilton Cork, MD. Presenting today for electrophysiology evaluation.  He has a history significant for atrial fibrillation, atypical atrial flutter, hypertension,, hyperlipidemia.  He was seen at [facility] 06/12/2021 with 2-1 atypical atrial flutter.  He been noncompliant with his medications including amiodarone.  He had an ejection fraction that showed a severely enlarged left atrium and ejection fraction of 50 to 55%.  He has oxygen dependent COPD, though he continues to smoke 1-2 packs of cigarettes daily.  In the past he has been intolerant of flecainide.  Today, he denies symptoms of palpitations, chest pain, orthopnea, PND, lower extremity edema, claudication, dizziness, presyncope, syncope, bleeding, or neurologic sequela. The patient is tolerating medications without difficulties.  He has baseline shortness of breath and fatigue.  He states that he wears oxygen sometimes around the house and when he is sleeping.  He does not feel like he has had more shortness of breath over the last few weeks compared to last summer.   Past Medical History:  Diagnosis Date   Alcohol use 09/12/2020   Arthritis    CHF (congestive heart failure) ([place])    Cigarette smoker 10/21/2014   COPD (chronic obstructive pulmonary disease) (HCC)    COPD exacerbation ([place]) 01/24/2017   Dyspnea    Dysrhythmia    atrial fibrillation   Essential hypertension 10/21/2014   Femur fracture, right ([place]) 09/12/2020   GERD (gastroesophageal reflux disease)    History of kidney stones    Hyperlipidemia 01/24/2017   Hypertension    Kidney stones  01/24/2017   Paroxysmal atrial fibrillation ([place]) 10/21/2014   Pressure injury of skin 09/16/2020   Past Surgical History:  Procedure Laterality Date   LEFT HEART CATH AND CORONARY ANGIOGRAPHY N/A 09/15/2020   Procedure: LEFT HEART CATH AND CORONARY ANGIOGRAPHY;  Surgeon: Sherren Mocha, MD;  Location: [place] CV LAB;  Service: Cardiovascular;  Laterality: N/A;   LEG SURGERY Right    steal pin placed in right leg 35 years ago   ORIF FEMUR FRACTURE Right 09/15/2020   Procedure: OPEN REDUCTION INTERNAL FIXATION FEMORAL SHAFT FRACTURE;  Surgeon: Shona Needles, MD;  Location: [place];  Service: Orthopedics;  Laterality: Right;     Current Outpatient Medications  Medication Sig Dispense Refill   albuterol ([name] HFA;VENTOLIN HFA) 108 (90 Base) MCG/ACT inhaler Inhale 2 puffs into the lungs every 4 (four) hours as needed for wheezing or shortness of breath.     apixaban (ELIQUIS) 5 MG TABS tablet Take 1 tablet (5 mg total) by mouth 2 (two) times daily. 60 tablet 11   atorvastatin (LIPITOR) 40 MG tablet Take 1 tablet (40 mg total) by mouth daily. 90 tablet 3   budesonide (PULMICORT) 0.5 MG/2ML nebulizer solution Take 0.5 mg by nebulization 2 (two) times daily.     cloNIDine (CATAPRES) 0.1 MG tablet TAKE 1 TABLET BY MOUTH ONCE DAILY AS NEEDED FOR  BLOOD  PRESSURE  OVER  237/628     folic acid (FOLVITE) 1 MG tablet Take 1 mg by mouth daily.     ipratropium-albuterol (DUONEB) 0.5-2.5 (3) MG/3ML SOLN  Inhale 3 mLs into the lungs every 6 (six) hours as needed for shortness of breath or wheezing.     levothyroxine (SYNTHROID) 25 MCG tablet Take 25 mcg by mouth daily before breakfast.     lisinopril (ZESTRIL) 40 MG tablet Take 20 mg by mouth daily at 12 noon.     Magnesium 400 MG CAPS Take 400 mg by mouth daily.     metoprolol succinate (TOPROL-XL) 25 MG 24 hr tablet Take 1 tablet (25 mg total) by mouth daily. Take with or immediately following a meal. 30 tablet 2   montelukast (SINGULAIR) 10 MG  tablet Take 10 mg by mouth daily at 12 noon.     potassium chloride SA (KLOR-CON) 20 MEQ tablet Take 20 mEq by mouth daily.     tamsulosin (FLOMAX) 0.4 MG CAPS capsule Take 0.4 mg by mouth daily.     thiamine (VITAMIN B1) 100 MG tablet Take 100 mg by mouth daily.     torsemide (DEMADEX) 20 MG tablet Take 1 tablet (20 mg total) by mouth as directed. 60 tablet 0   TRELEGY ELLIPTA 100-62.5-25 MCG/ACT AEPB Inhale 1 puff into the lungs daily.     No current facility-administered medications for this visit.    Allergies:   Diltiazem   Social History:  The patient  reports that he has been smoking cigarettes and cigars. He has a 62.00 pack-year smoking history. He has never used smokeless tobacco. He reports current alcohol use of about 5.0 standard drinks of alcohol per week. He reports that he does not use drugs.   Family History:  The patient's family history includes Breast cancer in his sister; Cancer in his mother; Diabetes in his mother.    ROS:  Please see the history of present illness.   Otherwise, review of systems is positive for none.   All other systems are reviewed and negative.    PHYSICAL EXAM: VS:  BP 112/72   Pulse 96   Ht '5\' 9"'$  (1.753 m)   Wt 198 lb (89.8 kg)   SpO2 95%   BMI 29.24 kg/m  , BMI Body mass index is 29.24 kg/m. GEN: Well nourished, well developed, in no acute distress  HEENT: normal  Neck: no JVD, carotid bruits, or masses Cardiac: RRR; no murmurs, rubs, or gallops,no edema  Respiratory:  clear to auscultation bilaterally, normal work of breathing GI: soft, nontender, nondistended, + BS MS: no deformity or atrophy  Skin: warm and dry Neuro:  Strength and sensation are intact Psych: euthymic mood, full affect  EKG:  EKG is not ordered today. Personal review of the ekg ordered 12/22/21 shows atrial flutter  91-day  No episodes acute and she MS diagnosed with 911 recent Labs: 10/14/2021: TSH 14.000 12/16/2021: ALT 46; Hemoglobin 10.9; Platelets  267 12/22/2021: BUN 10; Creatinine, Ser 0.89; NT-Pro BNP 639; Potassium 5.2; Sodium 131    Lipid Panel     Component Value Date/Time   CHOL 188 10/14/2021 0827   TRIG 93 10/14/2021 0827   HDL 92 10/14/2021 0827   CHOLHDL 2.0 10/14/2021 0827   LDLCALC 80 10/14/2021 0827     Wt Readings from Last 3 Encounters:  01/04/22 198 lb (89.8 kg)  12/22/21 203 lb (92.1 kg)  12/16/21 208 lb (94.3 kg)      Other studies Reviewed: Additional studies/ records that were reviewed today include: TTE 06/19/21  Review of the above records today demonstrates:  Ejection fraction 50 to 55% with severely dilated left atrium Mild  aortic stenosis Mild mitral regurgitation    ASSESSMENT AND PLAN:  1.  Persistent atrial fibrillation: CHA2DS2-VASc of at least 3.  Fortunately he remains in sinus rhythm.  His previous episode of atrial flutter occurred when he was noncompliant with his medications.  Amiodarone was stopped earlier this month.  At this point, I think that rhythm control with difficult.  Additionally, with his oxygen dependent COPD and continuing to smoke, would not get too aggressive with rhythm control.  Orla Estrin plan to increase his Toprol-XL to 50 mg daily.  Aside from that, follow-up with general cardiology.  2.  Hyperlipidemia: Continue statin per primary cardiology  3.  Secondary hypercoagulable state: Currently on Eliquis for atrial fibrillation as above  Case discussed with primary cardiology  Current medicines are reviewed at length with the patient today.   The patient does not have concerns regarding his medicines.  The following changes were made today:  none  Labs/ tests ordered today include:  No orders of the defined types were placed in this encounter.    Disposition:   FU with Jalyne Brodzinski  PRN  Signed, Ashlea Dusing Meredith Leeds, MD  01/04/2022 11:13 AM     [facility] [address] [place] [address] [phone] (office) [phone]  (fax)

## 2022-01-04 NOTE — Patient Instructions (Signed)
Medication Instructions:  Your physician has recommended you make the following change in your medication:  INCREASE Toprol to 50 mg once daily  *If you need a refill on your cardiac medications before your next appointment, please call your pharmacy*   Lab Work: None ordered   Testing/Procedures: None ordered   Follow-Up: At [facility], you and your health needs are our priority.  As part of our continuing mission to provide you with exceptional heart care, we have created designated Provider Care Teams.  These Care Teams include your primary Cardiologist (physician) and Advanced Practice Providers (APPs -  Physician Assistants and Nurse Practitioners) who all work together to provide you with the care you need, when you need it.  Your next appointment:   as  needed  The format for your next appointment:   In Person  Provider:   Allegra Lai, MD    Thank you for choosing [facility]!!   Trinidad Curet, RN [phone]  Other Instructions   Important Information About Sugar

## 2022-01-05 ENCOUNTER — Other Ambulatory Visit: Payer: Medicare Other

## 2022-01-06 ENCOUNTER — Ambulatory Visit: Payer: Medicare Other | Attending: Nurse Practitioner

## 2022-01-06 DIAGNOSIS — I480 Paroxysmal atrial fibrillation: Secondary | ICD-10-CM

## 2022-01-06 LAB — BASIC METABOLIC PANEL
BUN/Creatinine Ratio: 12 (ref 10–24)
BUN: 11 mg/dL (ref 8–27)
CO2: 29 mmol/L (ref 20–29)
Calcium: 9.5 mg/dL (ref 8.6–10.2)
Chloride: 88 mmol/L — ABNORMAL LOW (ref 96–106)
Creatinine, Ser: 0.94 mg/dL (ref 0.76–1.27)
Glucose: 95 mg/dL (ref 70–99)
Potassium: 3.9 mmol/L (ref 3.5–5.2)
Sodium: 132 mmol/L — ABNORMAL LOW (ref 134–144)
eGFR: 82 mL/min/{1.73_m2} (ref >59)

## 2022-01-06 LAB — MAGNESIUM: Magnesium: 1.9 mg/dL (ref 1.6–2.3)

## 2022-01-17 NOTE — Progress Notes (Signed)
Cardiology Office Note:    Date:  01/19/2022   ID:  Kyle Farley, DOB 07-29-1940, MRN [id number]  PCP:  Janie Morning, NP  Cardiologist:  Norman Herrlich, MD    Referring MD: Hortencia Conradi, NP    ASSESSMENT:    1. Paroxysmal atrial fibrillation (HCC)   2. Secondary hypercoagulable state (HCC)   3. 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 Mild CAD   6. Mixed hyperlipidemia    PLAN:    In order of problems listed above:  General has done well since cardioversion not on antiarrhythmic drug not having symptomatic atrial fibrillation and will continue his selective beta-blocker and his anticoagulant.  He is not felt to be a good candidate for further antiarrhythmic drug therapy for EP catheter ablation. Heart failure is compensated continue his current loop diuretic and antihypertensives with clonidine and lisinopril Primary problem is COPD and is now seeing a pulmonologist Stable CAD having no anginal discomfort he is on appropriate therapy including beta-blocker and statin   Next appointment: 6 months, I will check a magnesium BMP and proBNP level today.   Medication Adjustments/Labs and Tests Ordered: Current medicines are reviewed at length with the patient today.  Concerns regarding medicines are outlined above.  No orders of the defined types were placed in this encounter.  No orders of the defined types were placed in this encounter.   Chief complaint follow-up for atrial fibrillation and heart failure   History of Present Illness:    Kyle Farley is a 81 y.o. male with a hx of paroxysmal atrial fibrillation and atypical atrial flutter hypertensive heart disease with heart failure COPD with chronic oxygen therapy and mixed hyperlipidemia last seen 10/14/2021.  He was seen in consultation by me at [facility] 06/12/2021 when he presented again to the hospital  with atypical atrial flutter with 2-1 conduction he had been noncompliant with his medications including amiodarone was restarted on amiodarone twice daily beta-blocker anticoagulated and had stable CAD.  Because of the severity of his underlying lung disease amiodarone was felt to be contraindicated long-term and have been discontinued as an outpatient   His echocardiogram that admission showed a low normal ejection fraction 50 to 55% severe left atrial enlargement mild right atrial enlargement and mild aortic stenosis.  He was admitted to [facility] 12/01/2021 to 12/04/2021 with a COPD exacerbation.  His course was complicated by chronic hyponatremia with initial serum sodium 109 felt to be multifactorial due to alcohol abuse and diuretic therapy and received tolvaptan with a discharge sodium of 131.  He also had hyperkalemia and required Lokelma therapy potassium 3.8 at discharge from the hospital although heart failure was stable his diuretic dose was increased from 20 to 40 mg of furosemide daily and despite his alcohol abuse he was continued on his anticoagulant.  Laboratory studies included anemia hemoglobin 10.0 platelets 247,000 sodium 131 at discharge potassium 3.8 creatinine 0.9 his proBNP level is elevated on admission 1470 troponin did not show findings of acute coronary syndrome.  Compliance with diet, lifestyle and medications: Yes  He is now seeing a pulmonologist and he says he is doing better but he still wears oxygen mask as needed at nighttime and still is quite short of breath with activities and ongoing bronchospasm I do not have records but he tells me about a month ago he was at [facility] with respiratory  failure worsened COPD and his potassium and magnesium were depleted and he does not think they have been rechecked. He is not checking his pulse at home but is not having palpitation edema orthopnea chest pain or syncope He has had no bleeding complication with  his anticoagulant and he is no longer on amiodarone with the severity of his lung disease  He is intolerant of flecainide when he had 1 to 1 conduction of atrial flutter Past Medical History:  Diagnosis Date   Alcohol use 09/12/2020   Arthritis    CHF (congestive heart failure) (HCC)    Cigarette smoker 10/21/2014   COPD (chronic obstructive pulmonary disease) (HCC)    COPD exacerbation (HCC) 01/24/2017   Dyspnea    Dysrhythmia    atrial fibrillation   Essential hypertension 10/21/2014   Femur fracture, right (HCC) 09/12/2020   GERD (gastroesophageal reflux disease)    History of kidney stones    Hyperlipidemia 01/24/2017   Hypertension    Kidney stones 01/24/2017   Paroxysmal atrial fibrillation (HCC) 10/21/2014   Pressure injury of skin 09/16/2020    Past Surgical History:  Procedure Laterality Date   LEFT HEART CATH AND CORONARY ANGIOGRAPHY N/A 09/15/2020   Procedure: LEFT HEART CATH AND CORONARY ANGIOGRAPHY;  Surgeon: Tonny Bollman, MD;  Location: [facility] INVASIVE CV LAB;  Service: Cardiovascular;  Laterality: N/A;   LEG SURGERY Right    steal pin placed in right leg 35 years ago   ORIF FEMUR FRACTURE Right 09/15/2020   Procedure: OPEN REDUCTION INTERNAL FIXATION FEMORAL SHAFT FRACTURE;  Surgeon: Roby Lofts, MD;  Location: [facility] OR;  Service: Orthopedics;  Laterality: Right;    Current Medications: Current Meds  Medication Sig   albuterol ([name] HFA;VENTOLIN HFA) 108 (90 Base) MCG/ACT inhaler Inhale 2 puffs into the lungs every 4 (four) hours as needed for wheezing or shortness of breath.   apixaban (ELIQUIS) 5 MG TABS tablet Take 1 tablet (5 mg total) by mouth 2 (two) times daily.   atorvastatin (LIPITOR) 40 MG tablet Take 1 tablet (40 mg total) by mouth daily.   budesonide (PULMICORT) 0.5 MG/2ML nebulizer solution Take 0.5 mg by nebulization 2 (two) times daily.   cloNIDine (CATAPRES) 0.1 MG tablet TAKE 1 TABLET BY MOUTH ONCE DAILY AS NEEDED FOR  BLOOD  PRESSURE  OVER  160/100    folic acid (FOLVITE) 1 MG tablet Take 1 mg by mouth daily.   ipratropium-albuterol (DUONEB) 0.5-2.5 (3) MG/3ML SOLN Inhale 3 mLs into the lungs every 6 (six) hours as needed for shortness of breath or wheezing.   levothyroxine (SYNTHROID) 25 MCG tablet Take 25 mcg by mouth daily before breakfast.   lisinopril (ZESTRIL) 40 MG tablet Take 20 mg by mouth daily at 12 noon.   Magnesium 400 MG CAPS Take 400 mg by mouth daily.   metoprolol succinate (TOPROL-XL) 50 MG 24 hr tablet Take 1 tablet (50 mg total) by mouth daily. Take with or immediately following a meal.   montelukast (SINGULAIR) 10 MG tablet Take 10 mg by mouth daily at 12 noon.   potassium chloride SA (KLOR-CON) 20 MEQ tablet Take 20 mEq by mouth daily.   tamsulosin (FLOMAX) 0.4 MG CAPS capsule Take 0.4 mg by mouth daily.   thiamine (VITAMIN B1) 100 MG tablet Take 100 mg by mouth daily.   torsemide (DEMADEX) 20 MG tablet Take 1 tablet (20 mg total) by mouth as directed.   TRELEGY ELLIPTA 100-62.5-25 MCG/ACT AEPB Inhale 1 puff into the lungs daily.  Allergies:   Diltiazem   Social History   Socioeconomic History   Marital status: Widowed    Spouse name: Not on file   Number of children: Not on file   Years of education: Not on file   Highest education level: Not on file  Occupational History   Not on file  Tobacco Use   Smoking status: Every Day    Packs/day: 1.00    Years: 62.00    Total pack years: 62.00    Types: Cigarettes, Cigars   Smokeless tobacco: Never  Vaping Use   Vaping Use: Never used  Substance and Sexual Activity   Alcohol use: Yes    Alcohol/week: 5.0 standard drinks of alcohol    Types: 5 Standard drinks or equivalent per week    Comment: 5th of rum a week or 1 drink a day   Drug use: No   Sexual activity: Not Currently  Other Topics Concern   Not on file  Social History Narrative   Not on file   Social Determinants of Health   Financial Resource Strain: Low Risk  (11/10/2021)   Overall  Financial Resource Strain (CARDIA)    Difficulty of Paying Living Expenses: Not hard at all  Food Insecurity: No Food Insecurity (11/10/2021)   Hunger Vital Sign    Worried About Running Out of Food in the Last Year: Never true    Ran Out of Food in the Last Year: Never true  Transportation Needs: No Transportation Needs (11/10/2021)   PRAPARE - Administrator, Civil Service (Medical): No    Lack of Transportation (Non-Medical): No  Physical Activity: Inactive (11/10/2021)   Exercise Vital Sign    Days of Exercise per Week: 0 days    Minutes of Exercise per Session: 0 min  Stress: No Stress Concern Present (11/10/2021)   [employer] of Occupational Health - Occupational Stress Questionnaire    Feeling of Stress : Not at all  Social Connections: Moderately Isolated (11/10/2021)   Social Connection and Isolation Panel [NHANES]    Frequency of Communication with Friends and Family: More than three times a week    Frequency of Social Gatherings with Friends and Family: More than three times a week    Attends Religious Services: More than 4 times per year    Active Member of [employer] or Organizations: No    Attends Banker Meetings: Never    Marital Status: Widowed     Family History: The patient's family history includes Breast cancer in his sister; Cancer in his mother; Diabetes in his mother. ROS:   Please see the history of present illness.    All other systems reviewed and are negative.  EKGs/Labs/Other Studies Reviewed:    The following studies were reviewed today:  Left heart catheterization 09/15/2020 showed nonobstructive CAD.  Procedures  LEFT HEART CATH AND CORONARY ANGIOGRAPHY   Conclusion      Ost RCA to Prox RCA lesion is 50% stenosed.   Prox LAD lesion is 30% stenosed with 0% stenosed side branch in 1st Diag.   Prox Cx to Mid Cx lesion is 40% stenosed.   1.  Nonobstructive coronary artery disease with mild nonobstructive plaquing  in the LAD, left circumflex, and moderate calcific stenosis of the RCA ostium. 2.  Low normal LVEDP   Recommend: No high-grade coronary stenoses identified.  Patient with femur fracture.  He should not be at excessive risk of ischemic cardiac complications.  Echocardiogram 09/13/2020: 1. Left  ventricular ejection fraction, by estimation, is 40 to 45%. The  left ventricle has mildly decreased function. The left ventricle  demonstrates global hypokinesis. Left ventricular diastolic parameters are  indeterminate.   2. Right ventricular systolic function is normal. The right ventricular  size is normal. There is normal pulmonary artery systolic pressure.   3. Left atrial size was moderately dilated.   4. The mitral valve is grossly normal. Mild mitral valve regurgitation.   5. The aortic valve is calcified. Aortic valve regurgitation is not  visualized. Aortic valve sclerosis is present, with no evidence of aortic  valve stenosis.   6. The inferior vena cava is dilated in size with >50% respiratory  variability, suggesting right atrial pressure of 8 mmHg. Recent Labs: 10/14/2021: TSH 14.000 12/16/2021: ALT 46; Hemoglobin 10.9; Platelets 267 12/22/2021: NT-Pro BNP 639 01/06/2022: BUN 11; Creatinine, Ser 0.94; Magnesium 1.9; Potassium 3.9; Sodium 132  Recent Lipid Panel    Component Value Date/Time   CHOL 188 10/14/2021 0827   TRIG 93 10/14/2021 0827   HDL 92 10/14/2021 0827   CHOLHDL 2.0 10/14/2021 0827   LDLCALC 80 10/14/2021 0827    Physical Exam:    VS:  BP (!) 148/62 (BP Location: Left Arm, Patient Position: Sitting)   Pulse (!) 54   Ht 5\' 9"  (1.753 m)   Wt 202 lb (91.6 kg)   SpO2 91%   BMI 29.83 kg/m     Wt Readings from Last 3 Encounters:  01/19/22 202 lb (91.6 kg)  01/04/22 198 lb (89.8 kg)  12/22/21 203 lb (92.1 kg)     GEN: COPD appearance well nourished, well developed in no acute distress HEENT: Normal NECK: No JVD; No carotid bruits LYMPHATICS: No  lymphadenopathy CARDIAC: Distant heart sounds RRR, no murmurs, rubs, gallops RESPIRATORY: Barrel chested diminished breath sounds not wheezing ABDOMEN: Soft, non-tender, non-distended MUSCULOSKELETAL:  No edema; No deformity he has stasis skin changes SKIN: Warm and dry NEUROLOGIC:  Alert and oriented x 3 PSYCHIATRIC:  Normal affect    Signed, Norman Herrlich, MD  01/19/2022 8:55 AM    [facility]

## 2022-01-18 DIAGNOSIS — I350 Nonrheumatic aortic (valve) stenosis: Secondary | ICD-10-CM

## 2022-01-18 DIAGNOSIS — J449 Chronic obstructive pulmonary disease, unspecified: Secondary | ICD-10-CM | POA: Diagnosis not present

## 2022-01-18 HISTORY — DX: Nonrheumatic aortic (valve) stenosis: I35.0

## 2022-01-19 ENCOUNTER — Encounter: Payer: Self-pay | Admitting: Cardiology

## 2022-01-19 ENCOUNTER — Other Ambulatory Visit: Payer: Self-pay

## 2022-01-19 ENCOUNTER — Ambulatory Visit: Payer: Medicare HMO | Attending: Cardiology | Admitting: Cardiology

## 2022-01-19 VITALS — BP 148/62 | HR 54 | Ht 69.0 in | Wt 202.0 lb

## 2022-01-19 DIAGNOSIS — I5022 Chronic systolic (congestive) heart failure: Secondary | ICD-10-CM | POA: Diagnosis not present

## 2022-01-19 DIAGNOSIS — Z7951 Long term (current) use of inhaled steroids: Secondary | ICD-10-CM

## 2022-01-19 DIAGNOSIS — E782 Mixed hyperlipidemia: Secondary | ICD-10-CM

## 2022-01-19 DIAGNOSIS — J449 Chronic obstructive pulmonary disease, unspecified: Secondary | ICD-10-CM

## 2022-01-19 DIAGNOSIS — D6869 Other thrombophilia: Secondary | ICD-10-CM | POA: Diagnosis not present

## 2022-01-19 DIAGNOSIS — I110 Hypertensive heart disease with heart failure: Secondary | ICD-10-CM

## 2022-01-19 DIAGNOSIS — I2510 Atherosclerotic heart disease of native coronary artery without angina pectoris: Secondary | ICD-10-CM

## 2022-01-19 DIAGNOSIS — I480 Paroxysmal atrial fibrillation: Secondary | ICD-10-CM

## 2022-01-19 NOTE — Patient Instructions (Signed)
Medication Instructions:  Your physician recommends that you continue on your current medications as directed. Please refer to the Current Medication list given to you today.  *If you need a refill on your cardiac medications before your next appointment, please call your pharmacy*   Lab Work: Your physician recommends that you return for lab work in:   Labs today: BMP, Pro BNP, Magnesium  If you have labs (blood work) drawn today and your tests are completely normal, you will receive your results only by: MyChart Message (if you have MyChart) OR A paper copy in the mail If you have any lab test that is abnormal or we need to change your treatment, we will call you to review the results.   Testing/Procedures: None   Follow-Up: At [facility], you and your health needs are our priority.  As part of our continuing mission to provide you with exceptional heart care, we have created designated Provider Care Teams.  These Care Teams include your primary Cardiologist (physician) and Advanced Practice Providers (APPs -  Physician Assistants and Nurse Practitioners) who all work together to provide you with the care you need, when you need it.  We recommend signing up for the patient portal called "MyChart".  Sign up information is provided on this After Visit Summary.  MyChart is used to connect with patients for Virtual Visits (Telemedicine).  Patients are able to view lab/test results, encounter notes, upcoming appointments, etc.  Non-urgent messages can be sent to your provider as well.   To learn more about what you can do with MyChart, go to [email or web address].    Your next appointment:   6 month(s)  The format for your next appointment:   In Person  Provider:   Shirlee More, MD    Other Instructions Check home heart rates daily and record.  Use dermasil for dry skin  Important Information About Sugar

## 2022-01-20 DIAGNOSIS — J449 Chronic obstructive pulmonary disease, unspecified: Secondary | ICD-10-CM | POA: Diagnosis not present

## 2022-01-21 LAB — MAGNESIUM

## 2022-01-21 LAB — BASIC METABOLIC PANEL

## 2022-01-21 LAB — PRO B NATRIURETIC PEPTIDE

## 2022-01-21 LAB — SPECIMEN STATUS REPORT

## 2022-01-25 ENCOUNTER — Other Ambulatory Visit: Payer: Self-pay

## 2022-01-25 DIAGNOSIS — I480 Paroxysmal atrial fibrillation: Secondary | ICD-10-CM

## 2022-01-28 DIAGNOSIS — I509 Heart failure, unspecified: Secondary | ICD-10-CM | POA: Diagnosis not present

## 2022-01-28 DIAGNOSIS — I480 Paroxysmal atrial fibrillation: Secondary | ICD-10-CM | POA: Diagnosis not present

## 2022-01-28 DIAGNOSIS — I499 Cardiac arrhythmia, unspecified: Secondary | ICD-10-CM | POA: Diagnosis not present

## 2022-01-29 ENCOUNTER — Telehealth: Payer: Self-pay

## 2022-01-29 LAB — BASIC METABOLIC PANEL
BUN/Creatinine Ratio: 23 (ref 10–24)
BUN: 24 mg/dL (ref 8–27)
CO2: 24 mmol/L (ref 20–29)
Calcium: 9.7 mg/dL (ref 8.6–10.2)
Chloride: 91 mmol/L — ABNORMAL LOW (ref 96–106)
Creatinine, Ser: 1.05 mg/dL (ref 0.76–1.27)
Glucose: 107 mg/dL — ABNORMAL HIGH (ref 70–99)
Potassium: 4.6 mmol/L (ref 3.5–5.2)
Sodium: 135 mmol/L (ref 134–144)
eGFR: 72 mL/min/{1.73_m2} (ref >59)

## 2022-01-29 LAB — PRO B NATRIURETIC PEPTIDE: NT-Pro BNP: 379 pg/mL (ref 0–486)

## 2022-01-29 LAB — MAGNESIUM: Magnesium: 1.8 mg/dL (ref 1.6–2.3)

## 2022-01-29 NOTE — Telephone Encounter (Signed)
Informed patient of recent labs per Dr Bettina Gavia are normal or stable, all good no changes. Patient voices understanding had no other questions and thanked me for the call

## 2022-01-29 NOTE — Telephone Encounter (Signed)
-----  Message from Richardo Priest, MD sent at 01/29/2022 12:05 PM EST ----- Normal or stable result  All are good no changes

## 2022-02-14 NOTE — Progress Notes (Deleted)
Subjective:  Patient ID: Kyle Farley, male    DOB: 06-19-1940  Age: 82 y.o. MRN: [id number]  Chief Complaints:  History of Present illness: HYPERTENSION / [place]  Satisfied with current treatment? {Blank single:19197::"yes","no"} Duration of hypertension: {Blank single:19197::"chronic","months","years"} BP monitoring frequency: {Blank single:19197::"not checking","rarely","daily","weekly","monthly","a few times a day","a few times a week","a few times a month"} BP range:  BP medication side effects: {Blank single:19197::"yes","no"} Past BP meds: {Blank A999333 (bystolic)","carvedilol","chlorthalidone","clonidine","diltiazem","exforge HCT","HCTZ","irbesartan (avapro)","labetalol","lisinopril","lisinopril-HCTZ","losartan (cozaar)","methyldopa","nifedipine","olmesartan (benicar)","olmesartan-HCTZ","quinapril","ramipril","spironalactone","tekturna","valsartan","valsartan-HCTZ","verapamil"} Duration of hyperlipidemia: {Blank single:19197::"chronic","months","years"} Cholesterol medication side effects: {Blank single:19197::"yes","no"} Cholesterol supplements: {Blank multiple:19196::"none","fish oil","niacin","red yeast rice"} Past cholesterol medications: {Blank multiple:19196::"none","atorvastain (lipitor)","lovastatin (mevacor)","pravastatin (pravachol)","rosuvastatin (crestor)","simvastatin (zocor)","vytorin","fenofibrate (tricor)","gemfibrozil","ezetimide (zetia)","niaspan","lovaza"} Medication compliance: {Blank single:19197::"excellent compliance","good compliance","fair compliance","poor compliance"} Aspirin: {Blank single:19197::"yes","no"} Recent stressors: {Blank single:19197::"yes","no"} Recurrent headaches: {Blank single:19197::"yes","no"} Visual changes: {Blank single:19197::"yes","no"} Palpitations: {Blank single:19197::"yes","no"} Dyspnea: {Blank  single:19197::"yes","no"} Chest pain: {Blank single:19197::"yes","no"} Lower extremity edema: {Blank single:19197::"yes","no"} Dizzy/lightheaded: {Blank single:19197::"yes","no"}    COPD, Follow up  He was last seen for this {NUMBERS 1-12:18279} {days/wks/mos/yrs:310907} ago. Changes made include ***.   He reports {excellent/good/fair/poor:19665} compliance with treatment. He {IS/IS GI:087931 having side effects. *** he uses rescue inhaler {NUMBERS 1-12:18279} per {days/wks/mos/yrs:310907}. He IS experiencing {Symptoms; copd:17792}. He is NOT experiencing {Symptoms; copd:17792:o}. he reports breathing is {improved/worse/unchanged:3041574}.  Pulmonary Functions Testing Results:  No results found for: "FEV1", "FVC", "FEV1FVC", "TLC"    Current Outpatient Medications on File Prior to Visit  Medication Sig Dispense Refill   albuterol ([name] HFA;VENTOLIN HFA) 108 (90 Base) MCG/ACT inhaler Inhale 2 puffs into the lungs every 4 (four) hours as needed for wheezing or shortness of breath.     apixaban (ELIQUIS) 5 MG TABS tablet Take 1 tablet (5 mg total) by mouth 2 (two) times daily. 60 tablet 11   atorvastatin (LIPITOR) 40 MG tablet Take 1 tablet (40 mg total) by mouth daily. 90 tablet 3   budesonide (PULMICORT) 0.5 MG/2ML nebulizer solution Take 0.5 mg by nebulization 2 (two) times daily.     cloNIDine (CATAPRES) 0.1 MG tablet TAKE 1 TABLET BY MOUTH ONCE DAILY AS NEEDED FOR  BLOOD  PRESSURE  OVER  [id number]     folic acid (FOLVITE) 1 MG tablet Take 1 mg by mouth daily.     ipratropium-albuterol (DUONEB) 0.5-2.5 (3) MG/3ML SOLN Inhale 3 mLs into the lungs every 6 (six) hours as needed for shortness of breath or wheezing.     levothyroxine (SYNTHROID) 25 MCG tablet Take 25 mcg by mouth daily before breakfast.     lisinopril (ZESTRIL) 40 MG tablet Take 20 mg by mouth daily at 12 noon.     Magnesium 400 MG CAPS Take 400 mg by mouth daily.     metoprolol succinate (TOPROL-XL) 50 MG 24 hr  tablet Take 1 tablet (50 mg total) by mouth daily. Take with or immediately following a meal. 30 tablet 6   montelukast (SINGULAIR) 10 MG tablet Take 10 mg by mouth daily at 12 noon.     potassium chloride SA (KLOR-CON) 20 MEQ tablet Take 20 mEq by mouth daily.     tamsulosin (FLOMAX) 0.4 MG CAPS capsule Take 0.4 mg by mouth daily.     thiamine (VITAMIN B1) 100 MG tablet Take 100 mg by mouth daily.     torsemide (DEMADEX) 20 MG tablet Take 1 tablet (20 mg total) by mouth as directed. 60 tablet 0   TRELEGY ELLIPTA 100-62.5-25 MCG/ACT AEPB Inhale 1 puff into the lungs daily.     No current facility-administered medications on file prior to visit.  Past Medical History:  Diagnosis Date   Alcohol use 09/12/2020   Arthritis    CHF (congestive heart failure) ([place])    Cigarette smoker 10/21/2014   COPD (chronic obstructive pulmonary disease) ([facility])    COPD exacerbation ([place]) 01/24/2017   Dyspnea    Dysrhythmia    atrial fibrillation   Essential hypertension 10/21/2014   Femur fracture, right ([place]) 09/12/2020   GERD (gastroesophageal reflux disease)    History of kidney stones    Hyperlipidemia 01/24/2017   Hypertension    Kidney stones 01/24/2017   Paroxysmal atrial fibrillation ([place]) 10/21/2014   Pressure injury of skin 09/16/2020   Past Surgical History:  Procedure Laterality Date   LEFT HEART CATH AND CORONARY ANGIOGRAPHY N/A 09/15/2020   Procedure: LEFT HEART CATH AND CORONARY ANGIOGRAPHY;  Surgeon: Sherren Mocha, MD;  Location: [facility];  Service: Cardiovascular;  Laterality: N/A;   LEG SURGERY Right    steal pin placed in right leg 35 years ago   ORIF FEMUR FRACTURE Right 09/15/2020   Procedure: OPEN REDUCTION INTERNAL FIXATION FEMORAL SHAFT FRACTURE;  Surgeon: Shona Needles, MD;  Location: [facility];  Service: Orthopedics;  Laterality: Right;    Family History  Problem Relation Age of Onset   Cancer Mother    Diabetes Mother    Breast cancer Sister    Social History    Socioeconomic History   Marital status: Widowed    Spouse name: Not on file   Number of children: Not on file   Years of education: Not on file   Highest education level: Not on file  Occupational History   Not on file  Tobacco Use   Smoking status: Every Day    Packs/day: 1.00    Years: 62.00    Total pack years: 62.00    Types: Cigarettes, Cigars   Smokeless tobacco: Never  Vaping Use   Vaping Use: Never used  Substance and Sexual Activity   Alcohol use: Yes    Alcohol/week: 5.0 standard drinks of alcohol    Types: 5 Standard drinks or equivalent per week    Comment: 5th of rum a week or 1 drink a day   Drug use: No   Sexual activity: Not Currently  Other Topics Concern   Not on file  Social History Narrative   Not on file   Social Determinants of Health   Financial Resource Strain: Low Risk  (11/10/2021)   Overall Financial Resource Strain (CARDIA)    Difficulty of Paying Living Expenses: Not hard at all  Food Insecurity: No Food Insecurity (11/10/2021)   Hunger Vital Sign    Worried About Running Out of Food in the Last Year: Never true    Ran Out of Food in the Last Year: Never true  Transportation Needs: No Transportation Needs (11/10/2021)   PRAPARE - Hydrologist (Medical): No    Lack of Transportation (Non-Medical): No  Physical Activity: Inactive (11/10/2021)   Exercise Vital Sign    Days of Exercise per Week: 0 days    Minutes of Exercise per Session: 0 min  Stress: No Stress Concern Present (11/10/2021)   [place]    Feeling of Stress : Not at all  Social Connections: Moderately Isolated (11/10/2021)   Social Connection and Isolation Panel [NHANES]    Frequency of Communication with Friends and Family: More than three times a week    Frequency of Social Gatherings  with Friends and Family: More than three times a week    Attends Religious Services: More  than 4 times per year    Active Member of Clubs or Organizations: No    Attends Archivist Meetings: Never    Marital Status: Widowed    Review of Systems   Objective:  There were no vitals taken for this visit.     01/19/2022    8:54 AM 01/04/2022   10:51 AM 12/22/2021   10:00 AM  BP/Weight  Systolic BP [id number]  Diastolic BP 62 72 62  Wt. (Lbs) 202 198 203  BMI 29.83 kg/m2 29.24 kg/m2 29.98 kg/m2    Physical Exam  Diabetic Foot Exam - Simple   No data filed        11/10/2021    2:16 PM 12/27/2017    2:31 PM 01/24/2017   12:52 PM 01/20/2017    3:33 PM  Depression screen PHQ 2/9  Decreased Interest 0 0 0 0  Down, Depressed, Hopeless 0 0 0 0  PHQ - 2 Score 0 0 0 0  Altered sleeping 0     Tired, decreased energy 0     Change in appetite 0     Feeling bad or failure about yourself  0     Trouble concentrating 0     Moving slowly or fidgety/restless 0     Suicidal thoughts 0     PHQ-9 Score 0     Difficult doing work/chores Not difficult at all          09/18/2020    7:16 AM 09/19/2020    1:00 AM 09/19/2020    8:00 AM 09/19/2020    8:00 PM 11/10/2021    2:14 PM  Fall Risk  Falls in the past year?     0  Was there an injury with Fall?     0  Fall Risk Category Calculator     0  Fall Risk Category (Retired)     Low  (RETIRED) Patient Fall Risk Level High fall risk High fall risk High fall risk High fall risk Moderate fall risk  Patient at Risk for Falls Due to     Impaired balance/gait  Fall risk Follow up     Falls evaluation completed    Lab Results  Component Value Date   WBC 11.2 12/16/2021   HGB 10.9 (A) 12/16/2021   HCT 34 (A) 12/16/2021   PLT 267 12/16/2021   GLUCOSE 107 (H) 01/28/2022   CHOL 188 10/14/2021   TRIG 93 10/14/2021   HDL 92 10/14/2021   LDLCALC 80 10/14/2021   ALT 46 (A) 12/16/2021   AST 41 (A) 12/16/2021   NA 135 01/28/2022   K 4.6 01/28/2022   CL 91 (L) 01/28/2022   CREATININE 1.05 01/28/2022   BUN 24 01/28/2022   CO2  24 01/28/2022   TSH 14.000 (H) 10/14/2021      Assessment & Plan:   There are no diagnoses linked to this encounter.   Follow-up: No follow-ups on file.  An After Visit Summary was printed and given to the patient.  Neil Crouch, DNP, [place] [phone]

## 2022-02-15 ENCOUNTER — Ambulatory Visit: Payer: Medicare Other | Admitting: Nurse Practitioner

## 2022-02-15 DIAGNOSIS — J449 Chronic obstructive pulmonary disease, unspecified: Secondary | ICD-10-CM | POA: Diagnosis not present

## 2022-02-16 ENCOUNTER — Encounter: Payer: Self-pay | Admitting: Nurse Practitioner

## 2022-02-16 ENCOUNTER — Ambulatory Visit (INDEPENDENT_AMBULATORY_CARE_PROVIDER_SITE_OTHER): Payer: Medicare HMO | Admitting: Nurse Practitioner

## 2022-02-16 VITALS — BP 120/70 | HR 63 | Temp 97.5°F | Ht 69.0 in | Wt 208.4 lb

## 2022-02-16 DIAGNOSIS — E782 Mixed hyperlipidemia: Secondary | ICD-10-CM | POA: Diagnosis not present

## 2022-02-16 DIAGNOSIS — Z7951 Long term (current) use of inhaled steroids: Secondary | ICD-10-CM

## 2022-02-16 DIAGNOSIS — I110 Hypertensive heart disease with heart failure: Secondary | ICD-10-CM

## 2022-02-16 DIAGNOSIS — R69 Illness, unspecified: Secondary | ICD-10-CM | POA: Diagnosis not present

## 2022-02-16 DIAGNOSIS — Z720 Tobacco use: Secondary | ICD-10-CM | POA: Insufficient documentation

## 2022-02-16 DIAGNOSIS — I480 Paroxysmal atrial fibrillation: Secondary | ICD-10-CM | POA: Diagnosis not present

## 2022-02-16 DIAGNOSIS — R600 Localized edema: Secondary | ICD-10-CM

## 2022-02-16 DIAGNOSIS — Z125 Encounter for screening for malignant neoplasm of prostate: Secondary | ICD-10-CM

## 2022-02-16 DIAGNOSIS — E039 Hypothyroidism, unspecified: Secondary | ICD-10-CM | POA: Diagnosis not present

## 2022-02-16 DIAGNOSIS — F1014 Alcohol abuse with alcohol-induced mood disorder: Secondary | ICD-10-CM

## 2022-02-16 DIAGNOSIS — Z122 Encounter for screening for malignant neoplasm of respiratory organs: Secondary | ICD-10-CM | POA: Insufficient documentation

## 2022-02-16 DIAGNOSIS — I5023 Acute on chronic systolic (congestive) heart failure: Secondary | ICD-10-CM | POA: Diagnosis not present

## 2022-02-16 DIAGNOSIS — F17200 Nicotine dependence, unspecified, uncomplicated: Secondary | ICD-10-CM

## 2022-02-16 DIAGNOSIS — N400 Enlarged prostate without lower urinary tract symptoms: Secondary | ICD-10-CM | POA: Diagnosis not present

## 2022-02-16 DIAGNOSIS — J449 Chronic obstructive pulmonary disease, unspecified: Secondary | ICD-10-CM

## 2022-02-16 DIAGNOSIS — I10 Essential (primary) hypertension: Secondary | ICD-10-CM

## 2022-02-16 HISTORY — DX: Encounter for screening for malignant neoplasm of respiratory organs: Z12.2

## 2022-02-16 HISTORY — DX: Encounter for screening for malignant neoplasm of prostate: Z12.5

## 2022-02-16 HISTORY — DX: Nicotine dependence, unspecified, uncomplicated: F17.200

## 2022-02-16 HISTORY — DX: Localized edema: R60.0

## 2022-02-16 HISTORY — DX: Tobacco use: Z72.0

## 2022-02-16 NOTE — Assessment & Plan Note (Signed)
Following Cardiologist Dr Bettina Gavia

## 2022-02-16 NOTE — Assessment & Plan Note (Signed)
Last TSH 14, taking Synthroid 25 mcg OD Will repeat today for dose adjustment and follow up in 6 weeks to recheck labs.

## 2022-02-16 NOTE — Assessment & Plan Note (Signed)
Suggested to take Torsemide as needed Elevate the leg during night Compression socks   Watch your fluid and limit salt intake

## 2022-02-16 NOTE — Assessment & Plan Note (Signed)
Moderately controlled, SHORTNESS OF BREATH and wheezing at times Taking 2 l oxygen at night as needed Chronic smoker and refused to quit LDCT recommended Referred to Pulmonologist.

## 2022-02-16 NOTE — Assessment & Plan Note (Signed)
Well controlled with lipitor 40 mg OD  Nutrition: Stressed importance of moderation in sodium intake, saturated fat and cholesterol, caloric balance, sufficient intake of complex carbohydrates, fiber, calcium and iron.   Exercise: Stressed the importance of regular exercise.

## 2022-02-16 NOTE — Assessment & Plan Note (Addendum)
Well controlled  Lisinopril 20 mg OD Metoprolol 50 mg daily  Nutrition: Stressed importance of moderation in sodium intake, saturated fat and cholesterol, caloric balance, sufficient intake of complex carbohydrates, fiber, calcium and iron.   Exercise: Stressed the importance of regular exercise.

## 2022-02-16 NOTE — Assessment & Plan Note (Signed)
Patient drinks 4-5 beers per day Refused quitting  AAA discussed, patient said he will never leave his cigar, beers and coffee.

## 2022-02-16 NOTE — Assessment & Plan Note (Addendum)
Undercontrolled with   Amiodarone 2 times daily Eliquis 5 mg daily Following cardiology

## 2022-02-16 NOTE — Assessment & Plan Note (Signed)
Presence of BLLE edema Advised to elevate leg and compression socks as needed Taking Torsemide 20 mg as needed Amiodarone 2 times daily Eliquis 5 mg daily Lisinopril 20 mg OD Metoprolol 50 mg daily

## 2022-02-16 NOTE — Assessment & Plan Note (Signed)
LDCT placed in order

## 2022-02-16 NOTE — Progress Notes (Addendum)
Subjective:  Patient ID: Kyle Farley, male    DOB: Apr 13, 1940  Age: 82 y.o. MRN: [id number]  Chief Complaint  Patient presents with   COPD   Hypertension   Hyperlipidemia    HPI: Patient is here to discuss for his ongoing COPD, HYPERTENSION, Hyperlipidemia, A-fib, congestive heart failure. He is living home with his son. For the past 50 years he is been drinking and smoking. For the past year he is using oxygen regularly at night @ 2l. He complained he goes to [place] and cough when he walk stairs and with lots of activities. For HYPERTENSION: He was last seen for hypertension 3 months ago.  BP today 120/70. Management includes  clonidine 0.1 mg once daily, metoprolol 50 mg once a day, lisinopril 20 mg tablets once daily Eliquis 5 mg OD He reports good compliance with treatment. He is not having side effects He is following a Regular diet. He is not exercising. He does smoke. He is following to Dr Bettina Gavia, seen him 2 weeks ago  Use of agents associated with hypertension: none.   COPD  COPD status: controlled Satisfied with current treatment?: yes Oxygen use: no at 2 l / min, mostly at night  Dyspnea frequency: SHORTNESS OF BREATH all the time especially moving around and in activities Cough frequency:  2-3 per day Rescue inhaler frequency:  albuterol 5-6 times a day Trelegy morning one time Limitation of activity: no Productive cough: clear  Last Spirometry:  Pneumovax: Up to Date Influenza: Up to Date   Patient was referred to pulmonology once and he said he was never called for follow up.  Patient smoke a pack or more in a day, no desire to quit, patient said he can give up every pleasure but not his coffee, cigars and his beers (he drink 4-5 beers in a day). He is drinking an drinking since past 50 years.  ATRIAL FIBRILLATION  Atrial fibrillation status: uncontrolled Satisfied with current treatment: yes  Medication side effects:  no Medication  compliance: good compliance Etiology of atrial fibrillation:  Palpitations:  no Chest pain:  no Dyspnea on exertion:  yes when walking stairs  Orthopnea:  yes Syncope:  no Edema:  yes- bilateral edema Ventricular rate control: B-blocker Anti-coagulation:  Eliquis  , amiodarone '200mg'$  2 times daily,   Pertinent labs: Lab Results  Component Value Date   CHOL 188 10/14/2021   HDL 92 10/14/2021   LDLCALC 80 10/14/2021   TRIG 93 10/14/2021   CHOLHDL 2.0 10/14/2021   Lab Results  Component Value Date   NA 135 01/28/2022   K 4.6 01/28/2022   CREATININE 1.05 01/28/2022   EGFR 72 01/28/2022   GFRNONAA >60 09/18/2020   GLUCOSE 107 (H) 01/28/2022     The ASCVD Risk score (Arnett DK, et al., 2019) failed to calculate for the following reasons:   The 2019 ASCVD risk score is only valid for ages 54 to 19      02/16/2022    2:56 PM 11/10/2021    2:16 PM 12/27/2017    2:31 PM 01/24/2017   12:52 PM 01/20/2017    3:33 PM  Depression screen PHQ 2/9  Decreased Interest 0 0 0 0 0  Down, Depressed, Hopeless 0 0 0 0 0  PHQ - 2 Score 0 0 0 0 0  Altered sleeping  0     Tired, decreased energy  0     Change in appetite  0  Feeling bad or failure about yourself   0     Trouble concentrating  0     Moving slowly or fidgety/restless  0     Suicidal thoughts  0     PHQ-9 Score  0     Difficult doing work/chores  Not difficult at all            09/19/2020    1:00 AM 09/19/2020    8:00 AM 09/19/2020    8:00 PM 11/10/2021    2:14 PM 02/16/2022    2:55 PM  Fall Risk  Falls in the past year?    0 0  Was there an injury with Fall?    0 0  Fall Risk Category Calculator    0 0  Fall Risk Category (Retired)    Low   (RETIRED) Patient Fall Risk Level High fall risk High fall risk High fall risk Moderate fall risk   Patient at Risk for Falls Due to    Impaired balance/gait Impaired balance/gait  Fall risk Follow up    Falls evaluation completed       Review of Systems  Constitutional:  Negative  for chills, fatigue and fever.  HENT:  Negative for congestion, ear pain and sore throat.   Respiratory:  Positive for cough and shortness of breath.        Patient complained of SHORTNESS OF BREATH during moderate activities. He said he winded easily.  Cardiovascular:  Positive for leg swelling. Negative for chest pain.  Gastrointestinal:  Negative for abdominal pain, constipation, diarrhea, nausea and vomiting.  Genitourinary:  Negative for dysuria, frequency and urgency.  Musculoskeletal:  Negative for arthralgias, back pain and myalgias.  Neurological:  Negative for dizziness and headaches.  Psychiatric/Behavioral:  Negative for agitation and sleep disturbance. The patient is not nervous/anxious.     Current Outpatient Medications on File Prior to Visit  Medication Sig Dispense Refill   albuterol ([name] HFA;VENTOLIN HFA) 108 (90 Base) MCG/ACT inhaler Inhale 2 puffs into the lungs every 4 (four) hours as needed for wheezing or shortness of breath.     amiodarone (PACERONE) 200 MG tablet Take 200 mg by mouth 2 (two) times daily.     apixaban (ELIQUIS) 5 MG TABS tablet Take 1 tablet (5 mg total) by mouth 2 (two) times daily. 60 tablet 11   atorvastatin (LIPITOR) 40 MG tablet Take 1 tablet (40 mg total) by mouth daily. 90 tablet 3   budesonide (PULMICORT) 0.5 MG/2ML nebulizer solution Take 0.5 mg by nebulization 2 (two) times daily.     cloNIDine (CATAPRES) 0.1 MG tablet TAKE 1 TABLET BY MOUTH ONCE DAILY AS NEEDED FOR  BLOOD  PRESSURE  OVER  182/993     folic acid (FOLVITE) 1 MG tablet Take 1 mg by mouth daily.     ipratropium-albuterol (DUONEB) 0.5-2.5 (3) MG/3ML SOLN Inhale 3 mLs into the lungs every 6 (six) hours as needed for shortness of breath or wheezing.     levothyroxine (SYNTHROID) 25 MCG tablet Take 25 mcg by mouth daily before breakfast.     lisinopril (ZESTRIL) 40 MG tablet Take 20 mg by mouth daily at 12 noon.     Magnesium 400 MG CAPS Take 400 mg by mouth daily.      metoprolol succinate (TOPROL-XL) 50 MG 24 hr tablet Take 1 tablet (50 mg total) by mouth daily. Take with or immediately following a meal. 30 tablet 6   montelukast (SINGULAIR) 10 MG tablet Take 10 mg  by mouth daily at 12 noon.     potassium chloride SA (KLOR-CON) 20 MEQ tablet Take 20 mEq by mouth daily.     tamsulosin (FLOMAX) 0.4 MG CAPS capsule Take 0.4 mg by mouth daily.     thiamine (VITAMIN B1) 100 MG tablet Take 100 mg by mouth daily.     torsemide (DEMADEX) 20 MG tablet Take 1 tablet (20 mg total) by mouth as directed. 60 tablet 0   TRELEGY ELLIPTA 100-62.5-25 MCG/ACT AEPB Inhale 1 puff into the lungs daily.     No current facility-administered medications on file prior to visit.   Past Medical History:  Diagnosis Date   Alcohol use 09/12/2020   Arthritis    CHF (congestive heart failure) ([place])    Cigarette smoker 10/21/2014   COPD (chronic obstructive pulmonary disease) (HCC)    COPD exacerbation ([place]) 01/24/2017   Dyspnea    Dysrhythmia    atrial fibrillation   Essential hypertension 10/21/2014   Femur fracture, right ([place]) 09/12/2020   GERD (gastroesophageal reflux disease)    History of kidney stones    Hyperlipidemia 01/24/2017   Hypertension    Kidney stones 01/24/2017   Paroxysmal atrial fibrillation ([place]) 10/21/2014   Pressure injury of skin 09/16/2020   Past Surgical History:  Procedure Laterality Date   LEFT HEART CATH AND CORONARY ANGIOGRAPHY N/A 09/15/2020   Procedure: LEFT HEART CATH AND CORONARY ANGIOGRAPHY;  Surgeon: Sherren Mocha, MD;  Location: [facility];  Service: Cardiovascular;  Laterality: N/A;   LEG SURGERY Right    steal pin placed in right leg 35 years ago   ORIF FEMUR FRACTURE Right 09/15/2020   Procedure: OPEN REDUCTION INTERNAL FIXATION FEMORAL SHAFT FRACTURE;  Surgeon: Shona Needles, MD;  Location: [facility];  Service: Orthopedics;  Laterality: Right;    Family History  Problem Relation Age of Onset   Cancer Mother    Diabetes Mother     Breast cancer Sister    Social History   Socioeconomic History   Marital status: Widowed    Spouse name: Not on file   Number of children: Not on file   Years of education: Not on file   Highest education level: Not on file  Occupational History   Not on file  Tobacco Use   Smoking status: Every Day    Packs/day: 1.00    Years: 62.00    Total pack years: 62.00    Types: Cigarettes, Cigars   Smokeless tobacco: Never  Vaping Use   Vaping Use: Never used  Substance and Sexual Activity   Alcohol use: Yes    Alcohol/week: 5.0 standard drinks of alcohol    Types: 5 Standard drinks or equivalent per week    Comment: 5th of rum a week or 1 drink a day   Drug use: No   Sexual activity: Not Currently  Other Topics Concern   Not on file  Social History Narrative   Not on file   Social Determinants of Health   Financial Resource Strain: Low Risk  (11/10/2021)   Overall Financial Resource Strain (CARDIA)    Difficulty of Paying Living Expenses: Not hard at all  Food Insecurity: No Food Insecurity (11/10/2021)   Hunger Vital Sign    Worried About Running Out of Food in the Last Year: Never true    Ran Out of Food in the Last Year: Never true  Transportation Needs: No Transportation Needs (11/10/2021)   PRAPARE - Transportation    Lack of  Transportation (Medical): No    Lack of Transportation (Non-Medical): No  Physical Activity: Inactive (11/10/2021)   Exercise Vital Sign    Days of Exercise per Week: 0 days    Minutes of Exercise per Session: 0 min  Stress: No Stress Concern Present (11/10/2021)   [place]    Feeling of Stress : Not at all  Social Connections: Moderately Isolated (11/10/2021)   Social Connection and Isolation Panel [NHANES]    Frequency of Communication with Friends and Family: More than three times a week    Frequency of Social Gatherings with Friends and Family: More than three times a  week    Attends Religious Services: More than 4 times per year    Active Member of [employer] or Organizations: No    Attends Archivist Meetings: Never    Marital Status: Widowed    Objective:  BP 120/70 (BP Location: Right Arm, Patient Position: Sitting, Cuff Size: Large)   Pulse 63   Temp (!) 97.5 F (36.4 C) (Temporal)   Ht '5\' 9"'$  (1.753 m)   Wt 208 lb 6.4 oz (94.5 kg)   SpO2 95%   BMI 30.78 kg/m      02/16/2022    2:54 PM 01/19/2022    8:54 AM 01/04/2022   10:51 AM  BP/Weight  Systolic BP 213 086 578  Diastolic BP 70 62 72  Wt. (Lbs) 208.4 202 198  BMI 30.78 kg/m2 29.83 kg/m2 29.24 kg/m2    Physical Exam Vitals reviewed.  Constitutional:      Appearance: Normal appearance.  Cardiovascular:     Rate and Rhythm: Normal rate and regular rhythm.  Pulmonary:     Effort: Pulmonary effort is normal. No respiratory distress.     Breath sounds: Normal breath sounds. No stridor. No wheezing or rhonchi.  Abdominal:     General: Bowel sounds are normal.  Musculoskeletal:        General: Swelling present. Normal range of motion.     Cervical back: Normal range of motion.     Right lower leg: Edema present.     Left lower leg: Edema present.  Skin:    General: Skin is warm.     Findings: Rash present.     Comments: Bilateral legs rashes  Neurological:     Mental Status: He is alert.  Psychiatric:        Mood and Affect: Mood normal.        Behavior: Behavior normal.     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  Orders: -     CBC with Differential/Platelet -     Comprehensive metabolic panel -     Ambulatory referral to Pulmonology  Essential hypertension  Mixed hyperlipidemia Assessment & Plan: Well controlled with lipitor 40 mg OD  Nutrition: Stressed importance of moderation in sodium intake, saturated fat and cholesterol, caloric balance, sufficient intake of complex carbohydrates, fiber, calcium and iron.   Exercise: Stressed the importance of regular exercise.      Paroxysmal atrial fibrillation (HCC) Assessment & Plan: Undercontrolled with   Amiodarone 2 times daily Eliquis 5 mg daily Following cardiology   Encounter for prostate cancer screening Assessment & Plan: PSA will be screened today  Orders: -     PSA, total and free  Encounter for screening for lung cancer Assessment & Plan: LDCT placed in order  Orders: -     CT CHEST LUNG CANCER SCREENING LOW DOSE WO CONTRAST  Acquired hypothyroidism Assessment & Plan: Last TSH 14, taking Synthroid 25 mcg OD Will repeat today for dose adjustment and follow up in 6 weeks to recheck labs.  Orders: -     TSH -     T4, free  Acute on chronic systolic heart failure (HCC) Assessment & Plan: Presence of BLLE edema Advised to elevate leg and compression socks as needed Taking Torsemide 20 mg as needed Amiodarone 2 times daily Eliquis 5 mg daily Lisinopril 20 mg OD Metoprolol 50 mg daily   Alcohol abuse with alcohol-induced mood disorder (HCC) Assessment & Plan: Patient drinks 4-5 beers per day Refused quitting  AAA discussed, patient said he will never leave his cigar, beers and coffee.   Benign prostatic  hyperplasia without lower urinary tract symptoms Assessment & Plan: Controlled with Flomax 0.4 mg daily   Acute on chronic systolic congestive heart failure [facility]) Assessment & Plan: Following Cardiologist Dr Bettina Gavia   Primary hypertension Assessment & Plan: Well controlled  Lisinopril 20 mg OD Metoprolol 50 mg daily  Nutrition: Stressed importance of moderation in sodium intake, saturated fat and cholesterol, caloric balance, sufficient intake of complex carbohydrates, fiber, calcium and iron.   Exercise: Stressed the importance of regular exercise.      Declined smoking cessation Assessment & Plan: Patient said he is smoking 50 yrs and declined to quit   Bilateral leg edema Assessment & Plan: Suggested to take Torsemide as needed Elevate the leg during night Compression socks   Watch your fluid and limit salt intake     Orders Placed This Encounter  Procedures   CT CHEST LUNG CANCER SCREENING LOW DOSE WO CONTRAST   CBC with Differential/Platelet   Comprehensive metabolic panel   PSA, total and free   TSH   T4, Free   Ambulatory referral to Pulmonology     Follow-up: Return in about 6 weeks (around 03/30/2022) for TSH lab. I, Neil Crouch have reviewed all documentation for this visit. The documentation on 02/16/22   for the exam, diagnosis, procedures, and orders are all accurate and complete.     An After Visit Summary was printed and given to the patient.  Neil Crouch, [place] [phone]

## 2022-02-16 NOTE — Assessment & Plan Note (Signed)
Controlled with Flomax 0.4 mg daily

## 2022-02-16 NOTE — Patient Instructions (Signed)
Will check TSH today, will adjust the dose after today's result  Follow up in 6 weeks to recheck TSH     Hypothyroidism  Hypothyroidism is when the thyroid gland does not make enough of certain hormones. This is called an underactive thyroid. The thyroid gland is a small gland located in the lower front part of the neck, just in front of the windpipe (trachea). This gland makes hormones that help control how the body uses food for energy (metabolism) as well as how the heart and brain function. These hormones also play a role in keeping your bones strong. When the thyroid is underactive, it produces too little of the hormones thyroxine (T4) and triiodothyronine (T3). What are the causes? This condition may be caused by: Hashimoto's disease. This is a disease in which the body's disease-fighting system (immune system) attacks the thyroid gland. This is the most common cause. Viral infections. Pregnancy. Certain medicines. Birth defects. Problems with a gland in the center of the brain (pituitary gland). Lack of enough iodine in the diet. Other causes may include: Past radiation treatments to the head or neck for cancer. Past treatment with radioactive iodine. Past exposure to radiation in the environment. Past surgical removal of part or all of the thyroid. What increases the risk? You are more likely to develop this condition if: You are male. You have a family history of thyroid conditions. You use a medicine called lithium. You take medicines that affect the immune system (immunosuppressants). What are the signs or symptoms? Common symptoms of this condition include: Not being able to tolerate cold. Feeling as though you have no energy (lethargy). Lack of appetite. Constipation. Sadness or depression. Weight gain that is not explained by a change in diet or exercise habits. Menstrual irregularity. Dry skin, coarse hair, or brittle nails. Other symptoms may include: Muscle  pain. Slowing of thought processes. Poor memory. How is this diagnosed? This condition may be diagnosed based on: Your symptoms, your medical history, and a physical exam. Blood tests. You may also have imaging tests, such as an ultrasound or MRI. How is this treated? This condition is treated with medicine that replaces the thyroid hormones that your body does not make. After you begin treatment, it may take several weeks for symptoms to go away. Follow these instructions at home: Take over-the-counter and prescription medicines only as told by your health care provider. If you start taking any new medicines, tell your health care provider. Keep all follow-up visits as told by your health care provider. This is important. As your condition improves, your dosage of thyroid hormone medicine may change. You will need to have blood tests regularly so that your health care provider can monitor your condition. Contact a health care provider if: Your symptoms do not get better with treatment. You are taking thyroid hormone replacement medicine and you: Sweat a lot. Have tremors. Feel anxious. Lose weight rapidly. Cannot tolerate heat. Have emotional swings. Have diarrhea. Feel weak. Get help right away if: You have chest pain. You have an irregular heartbeat. You have a rapid heartbeat. You have difficulty breathing. These symptoms may be an emergency. Get help right away. Call 911. Do not wait to see if the symptoms will go away. Do not drive yourself to the hospital. Summary Hypothyroidism is when the thyroid gland does not make enough of certain hormones (it is underactive). When the thyroid is underactive, it produces too little of the hormones thyroxine (T4) and triiodothyronine (T3). The most common  cause is Hashimoto's disease, a disease in which the body's disease-fighting system (immune system) attacks the thyroid gland. The condition can also be caused by viral infections,  medicine, pregnancy, or past radiation treatment to the head or neck. Symptoms may include weight gain, dry skin, constipation, feeling as though you do not have energy, and not being able to tolerate cold. This condition is treated with medicine to replace the thyroid hormones that your body does not make. This information is not intended to replace advice given to you by your health care provider. Make sure you discuss any questions you have with your health care provider. Document Revised: 01/06/2021 Document Reviewed: 01/06/2021 Elsevier Patient Education  [place].

## 2022-02-16 NOTE — Assessment & Plan Note (Signed)
PSA will be screened today

## 2022-02-16 NOTE — Assessment & Plan Note (Signed)
Patient said he is smoking 50 yrs and declined to quit

## 2022-02-17 ENCOUNTER — Other Ambulatory Visit: Payer: Self-pay | Admitting: Nurse Practitioner

## 2022-02-17 DIAGNOSIS — E039 Hypothyroidism, unspecified: Secondary | ICD-10-CM

## 2022-02-17 LAB — T4, FREE: Free T4: 1.16 ng/dL (ref 0.82–1.77)

## 2022-02-17 LAB — TSH: TSH: 11.3 u[IU]/mL — ABNORMAL HIGH (ref 0.450–4.500)

## 2022-02-17 MED ORDER — LEVOTHYROXINE SODIUM 50 MCG PO TABS: 50.0000 ug | ORAL_TABLET | Freq: Every day | ORAL | 3 refills | Status: DC

## 2022-02-18 DIAGNOSIS — J449 Chronic obstructive pulmonary disease, unspecified: Secondary | ICD-10-CM | POA: Diagnosis not present

## 2022-02-18 LAB — COMPREHENSIVE METABOLIC PANEL
ALT: 28 IU/L (ref 0–44)
AST: 32 IU/L (ref 0–40)
Albumin/Globulin Ratio: 1.6 (ref 1.2–2.2)
Albumin: 4.2 g/dL (ref 3.8–4.8)
Alkaline Phosphatase: 106 IU/L (ref 44–121)
BUN/Creatinine Ratio: 14 (ref 10–24)
BUN: 12 mg/dL (ref 8–27)
Bilirubin Total: 0.4 mg/dL (ref 0.0–1.2)
CO2: 25 mmol/L (ref 20–29)
Calcium: 9.6 mg/dL (ref 8.6–10.2)
Chloride: 88 mmol/L — ABNORMAL LOW (ref 96–106)
Creatinine, Ser: 0.88 mg/dL (ref 0.76–1.27)
Globulin, Total: 2.6 g/dL (ref 1.5–4.5)
Glucose: 101 mg/dL — ABNORMAL HIGH (ref 70–99)
Potassium: 4.9 mmol/L (ref 3.5–5.2)
Sodium: 128 mmol/L — ABNORMAL LOW (ref 134–144)
Total Protein: 6.8 g/dL (ref 6.0–8.5)
eGFR: 87 mL/min/{1.73_m2} (ref >59)

## 2022-02-18 LAB — CBC WITH DIFFERENTIAL/PLATELET
Basophils Absolute: 0.1 10*3/uL (ref 0.0–0.2)
Basos: 1 %
EOS (ABSOLUTE): 0.2 10*3/uL (ref 0.0–0.4)
Eos: 2 %
Hematocrit: 36.7 % — ABNORMAL LOW (ref 37.5–51.0)
Hemoglobin: 12.2 g/dL — ABNORMAL LOW (ref 13.0–17.7)
Immature Grans (Abs): 0.1 10*3/uL (ref 0.0–0.1)
Immature Granulocytes: 1 %
Lymphocytes Absolute: 1.5 10*3/uL (ref 0.7–3.1)
Lymphs: 15 %
MCH: 29.5 pg (ref 26.6–33.0)
MCHC: 33.2 g/dL (ref 31.5–35.7)
MCV: 89 fL (ref 79–97)
Monocytes Absolute: 0.7 10*3/uL (ref 0.1–0.9)
Monocytes: 8 %
Neutrophils Absolute: 7.2 10*3/uL — ABNORMAL HIGH (ref 1.4–7.0)
Neutrophils: 73 %
Platelets: 270 10*3/uL (ref 150–450)
RBC: 4.14 x10E6/uL (ref 4.14–5.80)
RDW: 15.1 % (ref 11.6–15.4)
WBC: 9.7 10*3/uL (ref 3.4–10.8)

## 2022-02-18 LAB — PSA, TOTAL AND FREE
PSA, Free Pct: 14.3 %
PSA, Free: 0.2 ng/mL
Prostate Specific Ag, Serum: 1.4 ng/mL (ref 0.0–4.0)

## 2022-02-27 ENCOUNTER — Other Ambulatory Visit: Payer: Self-pay | Admitting: Nurse Practitioner

## 2022-03-01 ENCOUNTER — Other Ambulatory Visit: Payer: Self-pay

## 2022-03-05 ENCOUNTER — Other Ambulatory Visit: Payer: Self-pay

## 2022-03-05 MED ORDER — TORSEMIDE 20 MG PO TABS: 20.0000 mg | ORAL_TABLET | ORAL | 0 refills | Status: DC

## 2022-03-05 MED ORDER — ALBUTEROL SULFATE HFA 108 (90 BASE) MCG/ACT IN AERS: 2.0000 | INHALATION_SPRAY | RESPIRATORY_TRACT | 2 refills | Status: DC | PRN

## 2022-03-10 ENCOUNTER — Ambulatory Visit: Payer: Medicare HMO | Admitting: Pulmonary Disease

## 2022-03-10 ENCOUNTER — Ambulatory Visit (INDEPENDENT_AMBULATORY_CARE_PROVIDER_SITE_OTHER): Payer: Medicare HMO

## 2022-03-10 ENCOUNTER — Encounter: Payer: Self-pay | Admitting: Pulmonary Disease

## 2022-03-10 VITALS — BP 140/64 | HR 62 | Ht 69.0 in | Wt 212.0 lb

## 2022-03-10 DIAGNOSIS — R0602 Shortness of breath: Secondary | ICD-10-CM | POA: Diagnosis not present

## 2022-03-10 DIAGNOSIS — J441 Chronic obstructive pulmonary disease with (acute) exacerbation: Secondary | ICD-10-CM

## 2022-03-10 DIAGNOSIS — G4734 Idiopathic sleep related nonobstructive alveolar hypoventilation: Secondary | ICD-10-CM

## 2022-03-10 DIAGNOSIS — R69 Illness, unspecified: Secondary | ICD-10-CM | POA: Diagnosis not present

## 2022-03-10 DIAGNOSIS — F17210 Nicotine dependence, cigarettes, uncomplicated: Secondary | ICD-10-CM | POA: Diagnosis not present

## 2022-03-10 DIAGNOSIS — J449 Chronic obstructive pulmonary disease, unspecified: Secondary | ICD-10-CM | POA: Diagnosis not present

## 2022-03-10 MED ORDER — DOXYCYCLINE HYCLATE 100 MG PO TABS: 100.0000 mg | ORAL_TABLET | Freq: Two times a day (BID) | ORAL | 0 refills | Status: DC

## 2022-03-10 MED ORDER — PREDNISONE 10 MG PO TABS: ORAL_TABLET | ORAL | 0 refills | Status: AC

## 2022-03-10 NOTE — Patient Instructions (Addendum)
Continue trelegy ellipta 1 puff daily - rinse mouth out after each use  Continue Oxygen at night when sleeping  Use albuterol inhaler or nebulizer treatment as needed  Start prednisone taper with doxycycline (antibiotic)  We will check pulmonary function tests at follow up in 3 months

## 2022-03-10 NOTE — Progress Notes (Signed)
Synopsis: Referred in February 2024 for COPD by Neil Crouch, FNP  Subjective:   PATIENT ID: Kyle Farley GENDER: male DOB: 1940-04-04, MRN: [id number]  HPI  Chief Complaint  Patient presents with   Consult    Referred for COPD. States he has had COPD for the past 15-20 years. Mostly non-productive cough.    Shahrukh Horrocks is an 82 year old male, daily smoker with history of GERD, hypertension, paroxysmal atrial fibrillation, COPD with nocturnal hypoxemia who is referred to pulmonary clinic for COPD.   He reports progressive shortness of breath over the past 4-5 years. He is using trelegy 100, 1 puff daily and as needed albuterol inhaler or nebulizer treatments. He has significant dyspnea with any exertional activity. He uses a walker to ambulate. He is accompanied by his son. He complains of chest tightness or discomfort when he breathing gets bad. He has intermittent wheezing.   He is smoking 1-2 packs per day, he has been smoking for 68 years. He retired 10 years ago and lives with his son. He is not interested in quitting smoking.   Past Medical History:  Diagnosis Date   Alcohol use 09/12/2020   Arthritis    CHF (congestive heart failure) (HCC)    Cigarette smoker 10/21/2014   COPD (chronic obstructive pulmonary disease) (HCC)    COPD exacerbation (HCC) 01/24/2017   Dyspnea    Dysrhythmia    atrial fibrillation   Essential hypertension 10/21/2014   Femur fracture, right ([place]) 09/12/2020   GERD (gastroesophageal reflux disease)    History of kidney stones    Hyperlipidemia 01/24/2017   Hypertension    Kidney stones 01/24/2017   Paroxysmal atrial fibrillation ([place]) 10/21/2014   Pressure injury of skin 09/16/2020     Family History  Problem Relation Age of Onset   Cancer Mother    Diabetes Mother    Breast cancer Sister      Social History   Socioeconomic History   Marital status: Widowed    Spouse name: Not on file   Number of children: Not on file   Years of education:  Not on file   Highest education level: Not on file  Occupational History   Not on file  Tobacco Use   Smoking status: Every Day    Packs/day: 1.00    Years: 62.00    Total pack years: 62.00    Types: Cigarettes, Cigars   Smokeless tobacco: Never  Vaping Use   Vaping Use: Never used  Substance and Sexual Activity   Alcohol use: Yes    Alcohol/week: 5.0 standard drinks of alcohol    Types: 5 Standard drinks or equivalent per week    Comment: 5th of rum a week or 1 drink a day   Drug use: No   Sexual activity: Not Currently  Other Topics Concern   Not on file  Social History Narrative   Not on file   Social Determinants of Health   Financial Resource Strain: Low Risk  (11/10/2021)   Overall Financial Resource Strain (CARDIA)    Difficulty of Paying Living Expenses: Not hard at all  Food Insecurity: No Food Insecurity (11/10/2021)   Hunger Vital Sign    Worried About Running Out of Food in the Last Year: Never true    Ran Out of Food in the Last Year: Never true  Transportation Needs: No Transportation Needs (11/10/2021)   PRAPARE - Transportation    Lack of Transportation (Medical): No    Lack  of Transportation (Non-Medical): No  Physical Activity: Inactive (11/10/2021)   Exercise Vital Sign    Days of Exercise per Week: 0 days    Minutes of Exercise per Session: 0 min  Stress: No Stress Concern Present (11/10/2021)   [place]    Feeling of Stress : Not at all  Social Connections: Moderately Isolated (11/10/2021)   Social Connection and Isolation Panel [NHANES]    Frequency of Communication with Friends and Family: More than three times a week    Frequency of Social Gatherings with Friends and Family: More than three times a week    Attends Religious Services: More than 4 times per year    Active Member of [employer] or Organizations: No    Attends Archivist Meetings: Never    Marital Status:  Widowed  Intimate Partner Violence: Not At Risk (11/10/2021)   Humiliation, Afraid, Rape, and Kick questionnaire    Fear of Current or Ex-Partner: No    Emotionally Abused: No    Physically Abused: No    Sexually Abused: No     Allergies  Allergen Reactions   Diltiazem     Other reaction(s): Unknown     Outpatient Medications Prior to Visit  Medication Sig Dispense Refill   albuterol (VENTOLIN HFA) 108 (90 Base) MCG/ACT inhaler Inhale 2 puffs into the lungs every 4 (four) hours as needed for wheezing or shortness of breath. 1 each 2   amiodarone (PACERONE) 200 MG tablet Take 200 mg by mouth 2 (two) times daily.     amLODipine (NORVASC) 5 MG tablet Take 5 mg by mouth daily.     apixaban (ELIQUIS) 5 MG TABS tablet Take 1 tablet (5 mg total) by mouth 2 (two) times daily. 60 tablet 11   atorvastatin (LIPITOR) 40 MG tablet Take 1 tablet (40 mg total) by mouth daily. 90 tablet 3   cloNIDine (CATAPRES) 0.1 MG tablet TAKE 1 TABLET BY MOUTH ONCE DAILY AS NEEDED FOR  BLOOD  PRESSURE  OVER  [id number]     folic acid (FOLVITE) 1 MG tablet Take 1 mg by mouth daily.     ipratropium-albuterol (DUONEB) 0.5-2.5 (3) MG/3ML SOLN Inhale 3 mLs into the lungs every 6 (six) hours as needed for shortness of breath or wheezing.     levothyroxine (SYNTHROID) 50 MCG tablet Take 1 tablet (50 mcg total) by mouth daily. 90 tablet 3   lisinopril (ZESTRIL) 40 MG tablet Take 20 mg by mouth daily at 12 noon.     Magnesium 400 MG CAPS Take 400 mg by mouth daily.     metoprolol succinate (TOPROL-XL) 50 MG 24 hr tablet Take 1 tablet (50 mg total) by mouth daily. Take with or immediately following a meal. 30 tablet 6   montelukast (SINGULAIR) 10 MG tablet Take 10 mg by mouth daily at 12 noon.     potassium chloride SA (KLOR-CON) 20 MEQ tablet Take 20 mEq by mouth daily.     tamsulosin (FLOMAX) 0.4 MG CAPS capsule Take 0.4 mg by mouth daily.     thiamine (VITAMIN B1) 100 MG tablet Take 100 mg by mouth daily.     torsemide  (DEMADEX) 20 MG tablet Take 1 tablet (20 mg total) by mouth as directed. 60 tablet 0   TRELEGY ELLIPTA 100-62.5-25 MCG/ACT AEPB Inhale 1 puff into the lungs daily.     budesonide (PULMICORT) 0.5 MG/2ML nebulizer solution Take 0.5 mg by nebulization 2 (two) times daily.  No facility-administered medications prior to visit.   Review of Systems  Constitutional:  Negative for chills, fever, malaise/fatigue and weight loss.  HENT:  Negative for congestion, sinus pain and sore throat.   Eyes: Negative.   Respiratory:  Positive for cough, sputum production and shortness of breath. Negative for hemoptysis and wheezing.   Cardiovascular:  Positive for chest pain and palpitations. Negative for orthopnea, claudication and leg swelling.  Gastrointestinal:  Negative for abdominal pain, heartburn, nausea and vomiting.  Genitourinary: Negative.   Musculoskeletal:  Negative for joint pain and myalgias.  Skin:  Negative for rash.  Neurological:  Negative for weakness.  Endo/Heme/Allergies: Negative.   Psychiatric/Behavioral: Negative.     Objective:   Vitals:   03/10/22 0912  BP: (!) 140/64  Pulse: 62  SpO2: 96%  Weight: 212 lb (96.2 kg)  Height: 5' 9"$  (1.753 m)   Physical Exam Constitutional:      General: He is not in acute distress. HENT:     Head: Normocephalic and atraumatic.  Eyes:     Extraocular Movements: Extraocular movements intact.     Conjunctiva/sclera: Conjunctivae normal.     Pupils: Pupils are equal, round, and reactive to light.  Cardiovascular:     Rate and Rhythm: Normal rate and regular rhythm.     Pulses: Normal pulses.     Heart sounds: Normal heart sounds. No murmur heard. Pulmonary:     Breath sounds: Wheezing present.  Abdominal:     General: Bowel sounds are normal.     Palpations: Abdomen is soft.  Musculoskeletal:     Right lower leg: Edema present.     Left lower leg: Edema present.  Lymphadenopathy:     Cervical: No cervical adenopathy.  Skin:     General: Skin is warm and dry.  Neurological:     General: No focal deficit present.     Mental Status: He is alert.  Psychiatric:        Mood and Affect: Mood normal.        Behavior: Behavior normal.        Thought Content: Thought content normal.        Judgment: Judgment normal.     CBC    Component Value Date/Time   WBC 9.7 02/16/2022 1536   WBC 10.4 09/20/2020 0741   RBC 4.14 02/16/2022 1536   RBC 3.92 12/16/2021 0000   HGB 12.2 (L) 02/16/2022 1536   HCT 36.7 (L) 02/16/2022 1536   PLT 270 02/16/2022 1536   MCV 89 02/16/2022 1536   MCH 29.5 02/16/2022 1536   MCH 29.1 09/20/2020 0741   MCHC 33.2 02/16/2022 1536   MCHC 33.0 09/20/2020 0741   RDW 15.1 02/16/2022 1536   LYMPHSABS 1.5 02/16/2022 1536   EOSABS 0.2 02/16/2022 1536   BASOSABS 0.1 02/16/2022 1536      Latest Ref Rng & Units 02/16/2022    3:36 PM 01/28/2022   10:59 AM 01/20/2022   12:00 AM  BMP  Glucose 70 - 99 mg/dL 101  107  CANCELED   BUN 8 - 27 mg/dL 12  24  CANCELED   Creatinine 0.76 - 1.27 mg/dL 0.88  1.05  CANCELED   BUN/Creat Ratio 10 - 24 14  23    $ Sodium 134 - 144 mmol/L 128  135  CANCELED   Potassium 3.5 - 5.2 mmol/L 4.9  4.6  CANCELED   Chloride 96 - 106 mmol/L 88  91  CANCELED   CO2 20 - 29  mmol/L 25  24  CANCELED   Calcium 8.6 - 10.2 mg/dL 9.6  9.7  CANCELED    Chest imaging:  PFT:     No data to display          Labs:  Path:  Echo:  Heart Catheterization:  Assessment & Plan:   COPD exacerbation ([place]) - Plan: predniSONE (DELTASONE) 10 MG tablet, doxycycline (VIBRA-TABS) 100 MG tablet, DG Chest 2 View, Pulmonary Function Test  Nocturnal hypoxemia  Cigarette smoker  Discussion: Willie Gribbins is an 82 year old male, daily smoker with history of GERD, hypertension, paroxysmal atrial fibrillation, COPD with nocturnal hypoxemia who is referred to pulmonary clinic for COPD.   He has COPD exacerbation with wheezing on exam and increased dyspnea at this time.  He is to start  steroid taper and doxycycline. Continue trelegy 100, 1 puff daily and as needed albuterol inhaler or nebulizer treatment.   He is to follow up in 3 months with PFTs and we will consider transition to long acting nebulizer regimen.   Check chest x-ray today. Does not qualify for lung cancer screening based on age.  Freda Jackson, MD [facility] Office: [phone]   See Amion for personal pager PCCM on call pager [phone] until 7pm. Please call Elink 7p-7a. [phone]   Current Outpatient Medications:    albuterol (VENTOLIN HFA) 108 (90 Base) MCG/ACT inhaler, Inhale 2 puffs into the lungs every 4 (four) hours as needed for wheezing or shortness of breath., Disp: 1 each, Rfl: 2   amiodarone (PACERONE) 200 MG tablet, Take 200 mg by mouth 2 (two) times daily., Disp: , Rfl:    amLODipine (NORVASC) 5 MG tablet, Take 5 mg by mouth daily., Disp: , Rfl:    apixaban (ELIQUIS) 5 MG TABS tablet, Take 1 tablet (5 mg total) by mouth 2 (two) times daily., Disp: 60 tablet, Rfl: 11   atorvastatin (LIPITOR) 40 MG tablet, Take 1 tablet (40 mg total) by mouth daily., Disp: 90 tablet, Rfl: 3   cloNIDine (CATAPRES) 0.1 MG tablet, TAKE 1 TABLET BY MOUTH ONCE DAILY AS NEEDED FOR  BLOOD  PRESSURE  OVER  160/100, Disp: , Rfl:    doxycycline (VIBRA-TABS) 100 MG tablet, Take 1 tablet (100 mg total) by mouth 2 (two) times daily., Disp: 14 tablet, Rfl: 0   folic acid (FOLVITE) 1 MG tablet, Take 1 mg by mouth daily., Disp: , Rfl:    ipratropium-albuterol (DUONEB) 0.5-2.5 (3) MG/3ML SOLN, Inhale 3 mLs into the lungs every 6 (six) hours as needed for shortness of breath or wheezing., Disp: , Rfl:    levothyroxine (SYNTHROID) 50 MCG tablet, Take 1 tablet (50 mcg total) by mouth daily., Disp: 90 tablet, Rfl: 3   lisinopril (ZESTRIL) 40 MG tablet, Take 20 mg by mouth daily at 12 noon., Disp: , Rfl:    Magnesium 400 MG CAPS, Take 400 mg by mouth daily., Disp: , Rfl:    metoprolol succinate  (TOPROL-XL) 50 MG 24 hr tablet, Take 1 tablet (50 mg total) by mouth daily. Take with or immediately following a meal., Disp: 30 tablet, Rfl: 6   montelukast (SINGULAIR) 10 MG tablet, Take 10 mg by mouth daily at 12 noon., Disp: , Rfl:    potassium chloride SA (KLOR-CON) 20 MEQ tablet, Take 20 mEq by mouth daily., Disp: , Rfl:    predniSONE (DELTASONE) 10 MG tablet, Take 4 tablets (40 mg total) by mouth daily with breakfast for 3 days, THEN 3 tablets (  30 mg total) daily with breakfast for 3 days, THEN 2 tablets (20 mg total) daily with breakfast for 3 days, THEN 1 tablet (10 mg total) daily with breakfast for 3 days., Disp: 30 tablet, Rfl: 0   tamsulosin (FLOMAX) 0.4 MG CAPS capsule, Take 0.4 mg by mouth daily., Disp: , Rfl:    thiamine (VITAMIN B1) 100 MG tablet, Take 100 mg by mouth daily., Disp: , Rfl:    torsemide (DEMADEX) 20 MG tablet, Take 1 tablet (20 mg total) by mouth as directed., Disp: 60 tablet, Rfl: 0   TRELEGY ELLIPTA 100-62.5-25 MCG/ACT AEPB, Inhale 1 puff into the lungs daily., Disp: , Rfl:

## 2022-03-18 DIAGNOSIS — J449 Chronic obstructive pulmonary disease, unspecified: Secondary | ICD-10-CM | POA: Diagnosis not present

## 2022-03-19 DIAGNOSIS — J449 Chronic obstructive pulmonary disease, unspecified: Secondary | ICD-10-CM | POA: Diagnosis not present

## 2022-03-22 NOTE — Progress Notes (Unsigned)
Subjective:  Patient ID: Kyle Farley, male    DOB: [birth year]  Age: 82 y.o. MRN: [id number]  CHIEF COMPLAINT: Follow up to recheck labs    History of Present illness:  Patient is following up for the TSH lab which was 11.3 on his last visit . He was given synthroid 50 mcg, he said he is taken everyday on an empty stomach half an hour prior to food and any other medicines   Current Outpatient Medications on File Prior to Visit  Medication Sig Dispense Refill   albuterol (VENTOLIN HFA) 108 (90 Base) MCG/ACT inhaler Inhale 2 puffs into the lungs every 4 (four) hours as needed for wheezing or shortness of breath. 1 each 2   amiodarone (PACERONE) 200 MG tablet Take 200 mg by mouth 2 (two) times daily.     amLODipine (NORVASC) 5 MG tablet Take 5 mg by mouth daily.     apixaban (ELIQUIS) 5 MG TABS tablet Take 1 tablet (5 mg total) by mouth 2 (two) times daily. 60 tablet 11   atorvastatin (LIPITOR) 40 MG tablet Take 1 tablet (40 mg total) by mouth daily. 90 tablet 3   cloNIDine (CATAPRES) 0.1 MG tablet TAKE 1 TABLET BY MOUTH ONCE DAILY AS NEEDED FOR  BLOOD  PRESSURE  OVER  160/100     doxycycline (VIBRA-TABS) 100 MG tablet Take 1 tablet (100 mg total) by mouth 2 (two) times daily. 14 tablet 0   folic acid (FOLVITE) 1 MG tablet Take 1 mg by mouth daily.     ipratropium-albuterol (DUONEB) 0.5-2.5 (3) MG/3ML SOLN Inhale 3 mLs into the lungs every 6 (six) hours as needed for shortness of breath or wheezing.     levothyroxine (SYNTHROID) 50 MCG tablet Take 1 tablet (50 mcg total) by mouth daily. 90 tablet 3   lisinopril (ZESTRIL) 40 MG tablet Take 20 mg by mouth daily at 12 noon.     Magnesium 400 MG CAPS Take 400 mg by mouth daily.     metoprolol succinate (TOPROL-XL) 50 MG 24 hr tablet Take 1 tablet (50 mg total) by mouth daily. Take with or immediately following a meal. 30 tablet 6   montelukast (SINGULAIR) 10 MG tablet Take 10 mg by mouth daily at 12 noon.     potassium chloride SA (KLOR-CON)  20 MEQ tablet Take 20 mEq by mouth daily.     tamsulosin (FLOMAX) 0.4 MG CAPS capsule Take 0.4 mg by mouth daily.     thiamine (VITAMIN B1) 100 MG tablet Take 100 mg by mouth daily.     torsemide (DEMADEX) 20 MG tablet Take 1 tablet (20 mg total) by mouth as directed. 60 tablet 0   TRELEGY ELLIPTA 100-62.5-25 MCG/ACT AEPB Inhale 1 puff into the lungs daily.     No current facility-administered medications on file prior to visit.   Past Medical History:  Diagnosis Date   Alcohol use 09/12/2020   Arthritis    CHF (congestive heart failure) ([place])    Cigarette smoker 10/21/2014   COPD (chronic obstructive pulmonary disease) (HCC)    COPD exacerbation (HCC) 01/24/2017   Dyspnea    Dysrhythmia    atrial fibrillation   Essential hypertension 10/21/2014   Femur fracture, right ([place]) 09/12/2020   GERD (gastroesophageal reflux disease)    History of kidney stones    Hyperlipidemia 01/24/2017   Hypertension    Kidney stones 01/24/2017   Paroxysmal atrial fibrillation ([place]) 10/21/2014   Pressure injury of skin 09/16/2020   Past  Surgical History:  Procedure Laterality Date   LEFT HEART CATH AND CORONARY ANGIOGRAPHY N/A 09/15/2020   Procedure: LEFT HEART CATH AND CORONARY ANGIOGRAPHY;  Surgeon: Sherren Mocha, MD;  Location: [facility];  Service: Cardiovascular;  Laterality: N/A;   LEG SURGERY Right    steal pin placed in right leg 35 years ago   ORIF FEMUR FRACTURE Right 09/15/2020   Procedure: OPEN REDUCTION INTERNAL FIXATION FEMORAL SHAFT FRACTURE;  Surgeon: Shona Needles, MD;  Location: [facility];  Service: Orthopedics;  Laterality: Right;    Family History  Problem Relation Age of Onset   Cancer Mother    Diabetes Mother    Breast cancer Sister    Social History   Socioeconomic History   Marital status: Widowed    Spouse name: Not on file   Number of children: Not on file   Years of education: Not on file   Highest education level: Not on file  Occupational History   Not on file   Tobacco Use   Smoking status: Every Day    Packs/day: 1.00    Years: 62.00    Total pack years: 62.00    Types: Cigarettes, Cigars   Smokeless tobacco: Never  Vaping Use   Vaping Use: Never used  Substance and Sexual Activity   Alcohol use: Yes    Alcohol/week: 5.0 standard drinks of alcohol    Types: 5 Standard drinks or equivalent per week    Comment: 5th of rum a week or 1 drink a day   Drug use: No   Sexual activity: Not Currently  Other Topics Concern   Not on file  Social History Narrative   Not on file   Social Determinants of Health   Financial Resource Strain: Low Risk  (11/10/2021)   Overall Financial Resource Strain (CARDIA)    Difficulty of Paying Living Expenses: Not hard at all  Food Insecurity: No Food Insecurity (11/10/2021)   Hunger Vital Sign    Worried About Running Out of Food in the Last Year: Never true    Ran Out of Food in the Last Year: Never true  Transportation Needs: No Transportation Needs (11/10/2021)   PRAPARE - Hydrologist (Medical): No    Lack of Transportation (Non-Medical): No  Physical Activity: Inactive (11/10/2021)   Exercise Vital Sign    Days of Exercise per Week: 0 days    Minutes of Exercise per Session: 0 min  Stress: No Stress Concern Present (11/10/2021)   [place]    Feeling of Stress : Not at all  Social Connections: Moderately Isolated (11/10/2021)   Social Connection and Isolation Panel [NHANES]    Frequency of Communication with Friends and Family: More than three times a week    Frequency of Social Gatherings with Friends and Family: More than three times a week    Attends Religious Services: More than 4 times per year    Active Member of [employer] or Organizations: No    Attends Archivist Meetings: Never    Marital Status: Widowed    Review of Systems  Constitutional:  Negative for chills, diaphoresis, fatigue  and fever.  HENT:  Negative for congestion, ear pain and sore throat.   Respiratory:  Negative for cough and shortness of breath.   Cardiovascular:  Negative for chest pain and palpitations.  Gastrointestinal:  Negative for abdominal pain, constipation, diarrhea, nausea and vomiting.  Endocrine: Negative  for polydipsia, polyphagia and polyuria.  Genitourinary:  Negative for dysuria and frequency.  Musculoskeletal:  Negative for arthralgias, back pain and myalgias.  Neurological:  Negative for dizziness and headaches.  Psychiatric/Behavioral:  Negative for dysphoric mood. The patient is not nervous/anxious.      Objective:  There were no vitals taken for this visit.     03/10/2022    9:12 AM 02/16/2022    2:54 PM 01/19/2022    8:54 AM  BP/Weight  Systolic BP [id number] [id number] [id number]  Diastolic BP 64 70 62  Wt. (Lbs) 212 208.4 202  BMI 31.31 kg/m2 30.78 kg/m2 29.83 kg/m2    Physical Exam Vitals reviewed.  Constitutional:      Appearance: Normal appearance.  HENT:     Right Ear: Tympanic membrane normal.     Left Ear: Tympanic membrane normal.     Nose: Nose normal.     Mouth/Throat:     Pharynx: No oropharyngeal exudate or posterior oropharyngeal erythema.  Eyes:     Conjunctiva/sclera: Conjunctivae normal.  Neck:     Vascular: No carotid bruit.  Cardiovascular:     Rate and Rhythm: Normal rate and regular rhythm.     Pulses: Normal pulses.     Heart sounds: Normal heart sounds.  Pulmonary:     Effort: Pulmonary effort is normal.     Breath sounds: Normal breath sounds.  Abdominal:     General: Bowel sounds are normal.     Palpations: There is no mass.     Tenderness: There is no abdominal tenderness.  Musculoskeletal:     Cervical back: Normal range of motion.  Skin:    Findings: No lesion.  Neurological:     Mental Status: He is alert and oriented to person, place, and time.  Psychiatric:        Mood and Affect: Mood normal.        Behavior: Behavior normal.      Diabetic Foot Exam - Simple   No data filed        02/16/2022    2:56 PM 11/10/2021    2:16 PM 12/27/2017    2:31 PM 01/24/2017   12:52 PM 01/20/2017    3:33 PM  Depression screen PHQ 2/9  Decreased Interest 0 0 0 0 0  Down, Depressed, Hopeless 0 0 0 0 0  PHQ - 2 Score 0 0 0 0 0  Altered sleeping  0     Tired, decreased energy  0     Change in appetite  0     Feeling bad or failure about yourself   0     Trouble concentrating  0     Moving slowly or fidgety/restless  0     Suicidal thoughts  0     PHQ-9 Score  0     Difficult doing work/chores  Not difficult at all          09/19/2020    1:00 AM 09/19/2020    8:00 AM 09/19/2020    8:00 PM 11/10/2021    2:14 PM 02/16/2022    2:55 PM  Fall Risk  Falls in the past year?    0 0  Was there an injury with Fall?    0 0  Fall Risk Category Calculator    0 0  Fall Risk Category (Retired)    Low   (RETIRED) Patient Fall Risk Level High fall risk High fall risk High fall risk Moderate fall risk  Patient at Risk for Falls Due to    Impaired balance/gait Impaired balance/gait  Fall risk Follow up    Falls evaluation completed     Lab Results  Component Value Date   WBC 9.7 02/16/2022   HGB 12.2 (L) 02/16/2022   HCT 36.7 (L) 02/16/2022   PLT 270 02/16/2022   GLUCOSE 101 (H) 02/16/2022   CHOL 188 10/14/2021   TRIG 93 10/14/2021   HDL 92 10/14/2021   LDLCALC 80 10/14/2021   ALT 28 02/16/2022   AST 32 02/16/2022   NA 128 (L) 02/16/2022   K 4.9 02/16/2022   CL 88 (L) 02/16/2022   CREATININE 0.88 02/16/2022   BUN 12 02/16/2022   CO2 25 02/16/2022   TSH 11.300 (H) 02/16/2022      Assessment & Plan:   There are no diagnoses linked to this encounter.   Follow-up: No follow-ups on file.  An After Visit Summary was printed and given to the patient.  I, Neil Crouch have reviewed all documentation for this visit. The documentation on 03/22/22   for the exam, diagnosis, procedures, and orders are all accurate and complete.     Neil Crouch, DNP, [name] [phone]

## 2022-03-23 ENCOUNTER — Encounter: Payer: Medicare HMO | Admitting: Nurse Practitioner

## 2022-04-02 ENCOUNTER — Ambulatory Visit (INDEPENDENT_AMBULATORY_CARE_PROVIDER_SITE_OTHER): Payer: Medicare HMO | Admitting: Nurse Practitioner

## 2022-04-02 ENCOUNTER — Encounter: Payer: Self-pay | Admitting: Nurse Practitioner

## 2022-04-02 VITALS — BP 132/62 | HR 59 | Temp 97.3°F | Ht 69.0 in | Wt 214.8 lb

## 2022-04-02 DIAGNOSIS — E039 Hypothyroidism, unspecified: Secondary | ICD-10-CM

## 2022-04-02 DIAGNOSIS — F17210 Nicotine dependence, cigarettes, uncomplicated: Secondary | ICD-10-CM | POA: Diagnosis not present

## 2022-04-02 DIAGNOSIS — R69 Illness, unspecified: Secondary | ICD-10-CM | POA: Diagnosis not present

## 2022-04-02 DIAGNOSIS — R0609 Other forms of dyspnea: Secondary | ICD-10-CM

## 2022-04-02 DIAGNOSIS — I10 Essential (primary) hypertension: Secondary | ICD-10-CM

## 2022-04-02 DIAGNOSIS — J449 Chronic obstructive pulmonary disease, unspecified: Secondary | ICD-10-CM | POA: Diagnosis not present

## 2022-04-02 DIAGNOSIS — Z7951 Long term (current) use of inhaled steroids: Secondary | ICD-10-CM

## 2022-04-02 MED ORDER — TRELEGY ELLIPTA 200-62.5-25 MCG/ACT IN AEPB: 2.0000 | INHALATION_SPRAY | Freq: Every day | RESPIRATORY_TRACT | 1 refills | Status: DC

## 2022-04-02 MED ORDER — TRELEGY ELLIPTA 100-62.5-25 MCG/ACT IN AEPB: 1.0000 | INHALATION_SPRAY | Freq: Two times a day (BID) | RESPIRATORY_TRACT | 1 refills | Status: DC

## 2022-04-02 MED ORDER — IPRATROPIUM-ALBUTEROL 0.5-2.5 (3) MG/3ML IN SOLN: 3.0000 mL | Freq: Four times a day (QID) | RESPIRATORY_TRACT | 1 refills | Status: DC | PRN

## 2022-04-02 MED ORDER — AMLODIPINE BESYLATE 5 MG PO TABS: 5.0000 mg | ORAL_TABLET | Freq: Every day | ORAL | 0 refills | Status: DC

## 2022-04-02 NOTE — Patient Instructions (Signed)
Will call you regarding lab Will refer to pulmonology  COPD and Physical Activity Chronic obstructive pulmonary disease (COPD) is a long-term, or chronic, condition that affects the lungs. COPD is a general term that can be used to describe many problems that cause inflammation of the lungs and limit airflow. These conditions include chronic bronchitis and emphysema. The main symptom of COPD is shortness of breath, which makes it harder to do even simple tasks. This can also make it harder to exercise and stay active. Talk with your health care provider about treatments to help you breathe better and actions you can take to prevent breathing problems during physical activity. What are the benefits of exercising when you have COPD? Exercising regularly is an important part of a healthy lifestyle. You can still exercise and do physical activities even though you have COPD. Exercise and physical activity improve your shortness of breath by increasing blood flow (circulation). This causes your heart to pump more oxygen through your body. Moderate exercise can: Improve oxygen use. Increase your energy level. Help with shortness of breath. Strengthen your breathing muscles. Improve heart health. Help with sleep. Improve your self-esteem and feelings of self-worth. Lower depression, stress, and anxiety. Exercise can benefit everyone with COPD. The severity of your disease may affect how hard you can exercise, especially at first, but everyone can benefit. Talk with your health care provider about how much exercise is safe for you, and which activities and exercises are safe for you. What actions can I take to prevent breathing problems during physical activity? Sign up for a pulmonary rehabilitation program. This type of program may include: Education about lung diseases. Exercise classes that teach you how to exercise and be more active while improving your breathing. This usually involves: Exercise  using your lower extremities, such as a stationary bicycle. About 30 minutes of exercise, 2 to 5 times per week, for 6 to 12 weeks. Strength training, such as push-ups or leg lifts. Nutrition education. Group classes in which you can talk with others who also have COPD and learn ways to manage stress. If you use an oxygen tank, you should use it while you exercise. Work with your health care provider to adjust your oxygen for your physical activity. Your resting flow rate is different from your flow rate during physical activity. How to manage your breathing while exercising While you are exercising: Take slow breaths. Pace yourself, and do nottry to go too fast. Purse your lips while breathing out. Pursing your lips is similar to a kissing or whistling position. If doing exercise that uses a quick burst of effort, such as weight lifting: Breathe in before starting the exercise. Breathe out during the hardest part of the exercise, such as raising the weights. Where to find support You can find support for exercising with COPD from: Your health care provider. A pulmonary rehabilitation program. Your local health department or community health programs. Support groups, either online or in-person. Your health care provider may be able to recommend support groups. Where to find more information You can find more information about exercising with COPD from: American Lung Association: lung.org COPD Foundation: copdfoundation.org Contact a health care provider if: Your symptoms get worse. You have nausea. You have a fever. You want to start a new exercise program or a new activity. Get help right away if: You have chest pain. You cannot breathe. These symptoms may represent a serious problem that is an emergency. Do not wait to see if the  symptoms will go away. Get medical help right away. Call your local emergency services (911 in the [place] Do not drive yourself to the  hospital. Summary COPD is a general term that can be used to describe many different lung problems that cause lung inflammation and limit airflow. This includes chronic bronchitis and emphysema. Exercise and physical activity improve your shortness of breath by increasing blood flow (circulation). This causes your heart to provide more oxygen to your body. Contact your health care provider before starting any exercise program or new activity. Ask your health care provider what exercises and activities are safe for you. This information is not intended to replace advice given to you by your health care provider. Make sure you discuss any questions you have with your health care provider. Document Revised: 11/13/2019 Document Reviewed: 11/13/2019 Elsevier Patient Education  [place].

## 2022-04-02 NOTE — Assessment & Plan Note (Signed)
Continue using of oxygen as needed Chronic care management referral in place

## 2022-04-02 NOTE — Assessment & Plan Note (Signed)
Moderately controlled, SHORTNESS OF BREATH and wheezing at times Wheezing presents in clinic/ Peak Flow assessed and Duoneb provided Refilled all COPD meds Increased the dose of trelegy OD Taking 2 l oxygen at night as needed Chronic smoker and refused to quit LDCT in place and referral to pulmonology in place

## 2022-04-02 NOTE — Progress Notes (Signed)
Subjective:  Patient ID: Kyle Farley, male    DOB: 01-10-1941  Age: 82 y.o. MRN: [id number]  CHIEF COMPLAINT: Med refill and TSH labs  History of present illness:  Patient is here to discuss his meds refills and lab TSH (last TSH 11.3), he is taking synthroid 50 mcg daily. Today he is also complaining that he is having more trouble with breathing especially with activities.  He has a h/o of chronic COPD. He has albuterol 2 puff every 4 hours, duoneb every 6 hrs and trelegy once daily as his listed medicine, but he said he does not take his medicine as intrusted, it depends on how he feels. Lately from past week or two his COPD is bothering him. He is also a chain smoker and refused to quit smoking and to be on any medicines on his last visit with me , he smokes half pack to 1 pack cigarettes per day. He drinks 6-7 beer ever alternate day. On his last visit I referred him to LDCT which is pending soon. He states he is  running out of duoneb and trelegy. He has never been to pulmonology so far. He lives home with his son and most of the day living with fast food, he said he barely have a energy to cook. He wants me to refill his COPD , HYPERTENSION medicines and he is aware we can change the dose of synthroid after checking his TSH today.       04/02/2022   11:24 AM 04/02/2022   10:41 AM 02/16/2022    2:56 PM 11/10/2021    2:16 PM 12/27/2017    2:31 PM  Depression screen PHQ 2/9  Decreased Interest 0 0 0 0 0  Down, Depressed, Hopeless 0 0 0 0 0  PHQ - 2 Score 0 0 0 0 0  Altered sleeping    0   Tired, decreased energy    0   Change in appetite    0   Feeling bad or failure about yourself     0   Trouble concentrating    0   Moving slowly or fidgety/restless    0   Suicidal thoughts    0   PHQ-9 Score    0   Difficult doing work/chores    Not difficult at all          09/19/2020    8:00 PM 11/10/2021    2:14 PM 02/16/2022    2:55 PM 04/02/2022   10:41 AM 04/02/2022   11:24 AM  Fall  Risk  Falls in the past year?  0 0 0 0  Was there an injury with Fall?  0 0 0 0  Fall Risk Category Calculator  0 0 0 0  Fall Risk Category (Retired)  Low     (RETIRED) Patient Fall Risk Level High fall risk Moderate fall risk     Patient at Risk for Falls Due to  Impaired balance/gait Impaired balance/gait No Fall Risks   Fall risk Follow up  Falls evaluation completed  Falls evaluation completed       Review of Systems  Constitutional:  Negative for fatigue.  HENT:  Negative for congestion, ear pain and sore throat.   Respiratory:  Positive for wheezing. Negative for cough.   Cardiovascular:  Negative for chest pain.  Gastrointestinal:  Negative for abdominal pain, constipation, diarrhea, nausea and vomiting.  Genitourinary:  Negative for dysuria, frequency and urgency.  Musculoskeletal:  Negative for arthralgias,  back pain and myalgias.  Neurological:  Negative for dizziness and headaches.  Psychiatric/Behavioral:  Negative for agitation and sleep disturbance. The patient is not nervous/anxious.     Current Outpatient Medications on File Prior to Visit  Medication Sig Dispense Refill   albuterol (VENTOLIN HFA) 108 (90 Base) MCG/ACT inhaler Inhale 2 puffs into the lungs every 4 (four) hours as needed for wheezing or shortness of breath. 1 each 2   amiodarone (PACERONE) 200 MG tablet Take 200 mg by mouth 2 (two) times daily.     apixaban (ELIQUIS) 5 MG TABS tablet Take 1 tablet (5 mg total) by mouth 2 (two) times daily. 60 tablet 11   atorvastatin (LIPITOR) 40 MG tablet Take 1 tablet (40 mg total) by mouth daily. 90 tablet 3   levothyroxine (SYNTHROID) 50 MCG tablet Take 1 tablet (50 mcg total) by mouth daily. 90 tablet 3   metoprolol succinate (TOPROL-XL) 50 MG 24 hr tablet Take 1 tablet (50 mg total) by mouth daily. Take with or immediately following a meal. 30 tablet 6   montelukast (SINGULAIR) 10 MG tablet Take 10 mg by mouth daily at 12 noon.     torsemide (DEMADEX) 20 MG tablet  Take 1 tablet (20 mg total) by mouth as directed. 60 tablet 0   lisinopril (ZESTRIL) 40 MG tablet Take 20 mg by mouth daily at 12 noon.     Magnesium 400 MG CAPS Take 400 mg by mouth daily.     potassium chloride SA (KLOR-CON) 20 MEQ tablet Take 20 mEq by mouth daily.     tamsulosin (FLOMAX) 0.4 MG CAPS capsule Take 0.4 mg by mouth daily.     thiamine (VITAMIN B1) 100 MG tablet Take 100 mg by mouth daily.     No current facility-administered medications on file prior to visit.   Past Medical History:  Diagnosis Date   Alcohol use 09/12/2020   Arthritis    CHF (congestive heart failure) ([facility])    Cigarette smoker 10/21/2014   COPD (chronic obstructive pulmonary disease) ([facility])    COPD exacerbation ([facility]) 01/24/2017   Dyspnea    Dysrhythmia    atrial fibrillation   Essential hypertension 10/21/2014   Femur fracture, right ([facility]) 09/12/2020   GERD (gastroesophageal reflux disease)    History of kidney stones    Hyperlipidemia 01/24/2017   Hypertension    Kidney stones 01/24/2017   Paroxysmal atrial fibrillation ([facility]) 10/21/2014   Pressure injury of skin 09/16/2020   Past Surgical History:  Procedure Laterality Date   LEFT HEART CATH AND CORONARY ANGIOGRAPHY N/A 09/15/2020   Procedure: LEFT HEART CATH AND CORONARY ANGIOGRAPHY;  Surgeon: Sherren Mocha, MD;  Location: [facility];  Service: Cardiovascular;  Laterality: N/A;   LEG SURGERY Right    steal pin placed in right leg 35 years ago   ORIF FEMUR FRACTURE Right 09/15/2020   Procedure: OPEN REDUCTION INTERNAL FIXATION FEMORAL SHAFT FRACTURE;  Surgeon: Shona Needles, MD;  Location: [facility];  Service: Orthopedics;  Laterality: Right;    Family History  Problem Relation Age of Onset   Cancer Mother    Diabetes Mother    Breast cancer Sister    Social History   Socioeconomic History   Marital status: Widowed    Spouse name: Not on file   Number of children: Not on file   Years of education: Not on file   Highest education level:  Not on file  Occupational History   Not on file  Tobacco Use   Smoking status: Every Day    Packs/day: 1.00    Years: 62.00    Additional pack years: 0.00    Total pack years: 62.00    Types: Cigarettes, Cigars   Smokeless tobacco: Never  Vaping Use   Vaping Use: Never used  Substance and Sexual Activity   Alcohol use: Yes    Alcohol/week: 5.0 standard drinks of alcohol    Types: 5 Standard drinks or equivalent per week    Comment: 5th of rum a week or 1 drink a day   Drug use: No   Sexual activity: Not Currently  Other Topics Concern   Not on file  Social History Narrative   Not on file   Social Determinants of Health   Financial Resource Strain: Low Risk  (11/10/2021)   Overall Financial Resource Strain (CARDIA)    Difficulty of Paying Living Expenses: Not hard at all  Food Insecurity: No Food Insecurity (11/10/2021)   Hunger Vital Sign    Worried About Running Out of Food in the Last Year: Never true    Ran Out of Food in the Last Year: Never true  Transportation Needs: No Transportation Needs (11/10/2021)   PRAPARE - Hydrologist (Medical): No    Lack of Transportation (Non-Medical): No  Physical Activity: Inactive (11/10/2021)   Exercise Vital Sign    Days of Exercise per Week: 0 days    Minutes of Exercise per Session: 0 min  Stress: No Stress Concern Present (11/10/2021)   [place]    Feeling of Stress : Not at all  Social Connections: Moderately Isolated (11/10/2021)   Social Connection and Isolation Panel [NHANES]    Frequency of Communication with Friends and Family: More than three times a week    Frequency of Social Gatherings with Friends and Family: More than three times a week    Attends Religious Services: More than 4 times per year    Active Member of [employer] or Organizations: No    Attends Archivist Meetings: Never    Marital Status: Widowed     Objective:  BP 132/62 (BP Location: Left Arm, Patient Position: Sitting, Cuff Size: Large)   Pulse (!) 59   Temp (!) 97.3 F (36.3 C) (Temporal)   Ht 5\' 9"  (1.753 m)   Wt 214 lb 12.8 oz (97.4 kg)   SpO2 92%   BMI 31.72 kg/m      04/02/2022   10:34 AM 03/10/2022    9:12 AM 02/16/2022    2:54 PM  BP/Weight  Systolic BP [id number]  Diastolic BP 62 64 70  Wt. (Lbs) 214.8 212 208.4  BMI 31.72 kg/m2 31.31 kg/m2 30.78 kg/m2    Physical Exam Vitals reviewed.  Constitutional:      Appearance: Normal appearance.  Cardiovascular:     Rate and Rhythm: Normal rate and regular rhythm.  Pulmonary:     Effort: Pulmonary effort is normal.     Breath sounds: Wheezing present.  Abdominal:     General: Bowel sounds are normal.     Palpations: Abdomen is soft.     Tenderness: There is no abdominal tenderness.  Musculoskeletal:        General: Normal range of motion.     Cervical back: Normal range of motion.  Skin:    General: Skin is warm.  Neurological:     Mental Status: He is alert and  oriented to person, place, and time.  Psychiatric:        Mood and Affect: Mood normal.        Behavior: Behavior normal.   Peak Flow Pretreatment: 150, 170, 175    Lab Results  Component Value Date   WBC 9.7 02/16/2022   HGB 12.2 (L) 02/16/2022   HCT 36.7 (L) 02/16/2022   PLT 270 02/16/2022   GLUCOSE 101 (H) 02/16/2022   CHOL 188 10/14/2021   TRIG 93 10/14/2021   HDL 92 10/14/2021   LDLCALC 80 10/14/2021   ALT 28 02/16/2022   AST 32 02/16/2022   NA 128 (L) 02/16/2022   K 4.9 02/16/2022   CL 88 (L) 02/16/2022   CREATININE 0.88 02/16/2022   BUN 12 02/16/2022   CO2 25 02/16/2022   TSH 11.300 (H) 02/16/2022      Assessment & Plan:   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 [facility]) Assessment & Plan: Moderately controlled, SHORTNESS OF BREATH and wheezing at times Wheezing presents in clinic/ Peak Flow assessed and Duoneb provided Refilled all COPD meds Increased the dose of  trelegy OD Taking 2 l oxygen at night as needed Chronic smoker and refused to quit LDCT in place and referral to pulmonology in place   Orders: -     amLODIPine Besylate; Take 1 tablet (5 mg total) by mouth daily.  Dispense: 90 tablet; Refill: 0 -     Ipratropium-Albuterol; Inhale 3 mLs into the lungs every 6 (six) hours as needed.  Dispense: 360 mL; Refill: 1 -     Peak flow meter -     CBC with Differential/Platelet -     Comprehensive metabolic panel -     TSH -     T4, free -     AMB Referral to Chronic Care Management Services -     Ambulatory referral to Pulmonology  Cigarette smoker Assessment & Plan: Refused smoking cessation Smoke half to 1 pack per day Refused smoking cessation and medicine  Orders: -     Ambulatory referral to Pulmonology  Acquired hypothyroidism -     TSH -     T4, free  Essential hypertension -     AMB Referral to Chronic Care Management Services  Dyspnea on exertion Assessment & Plan: Continue using of oxygen as needed Chronic care management referral in place   Other orders -     Trelegy Ellipta; Inhale 2 puffs into the lungs daily.  Dispense: 60 each; Refill: 1       Follow-up: Return in about 3 months (around 07/03/2022) for CHRONIC, FASTING.     An After Visit Summary was printed and given to the patient.  Neil Crouch, [place] [phone]

## 2022-04-02 NOTE — Assessment & Plan Note (Signed)
Refused smoking cessation Smoke half to 1 pack per day Refused smoking cessation and medicine

## 2022-04-03 LAB — COMPREHENSIVE METABOLIC PANEL
ALT: 30 IU/L (ref 0–44)
AST: 30 IU/L (ref 0–40)
Albumin/Globulin Ratio: 1.7 (ref 1.2–2.2)
Albumin: 4.3 g/dL (ref 3.8–4.8)
Alkaline Phosphatase: 98 IU/L (ref 44–121)
BUN/Creatinine Ratio: 15 (ref 10–24)
BUN: 16 mg/dL (ref 8–27)
Bilirubin Total: 0.5 mg/dL (ref 0.0–1.2)
CO2: 25 mmol/L (ref 20–29)
Calcium: 9.9 mg/dL (ref 8.6–10.2)
Chloride: 90 mmol/L — ABNORMAL LOW (ref 96–106)
Creatinine, Ser: 1.06 mg/dL (ref 0.76–1.27)
Globulin, Total: 2.6 g/dL (ref 1.5–4.5)
Glucose: 93 mg/dL (ref 70–99)
Potassium: 4.8 mmol/L (ref 3.5–5.2)
Sodium: 131 mmol/L — ABNORMAL LOW (ref 134–144)
Total Protein: 6.9 g/dL (ref 6.0–8.5)
eGFR: 71 mL/min/{1.73_m2} (ref >59)

## 2022-04-03 LAB — CBC WITH DIFFERENTIAL/PLATELET
Basophils Absolute: 0.1 10*3/uL (ref 0.0–0.2)
Basos: 1 %
EOS (ABSOLUTE): 0.2 10*3/uL (ref 0.0–0.4)
Eos: 2 %
Hematocrit: 36.7 % — ABNORMAL LOW (ref 37.5–51.0)
Hemoglobin: 11.9 g/dL — ABNORMAL LOW (ref 13.0–17.7)
Immature Grans (Abs): 0.1 10*3/uL (ref 0.0–0.1)
Immature Granulocytes: 1 %
Lymphocytes Absolute: 1.7 10*3/uL (ref 0.7–3.1)
Lymphs: 20 %
MCH: 28.5 pg (ref 26.6–33.0)
MCHC: 32.4 g/dL (ref 31.5–35.7)
MCV: 88 fL (ref 79–97)
Monocytes Absolute: 1 10*3/uL — ABNORMAL HIGH (ref 0.1–0.9)
Monocytes: 13 %
Neutrophils Absolute: 5.3 10*3/uL (ref 1.4–7.0)
Neutrophils: 63 %
Platelets: 230 10*3/uL (ref 150–450)
RBC: 4.17 x10E6/uL (ref 4.14–5.80)
RDW: 14.2 % (ref 11.6–15.4)
WBC: 8.3 10*3/uL (ref 3.4–10.8)

## 2022-04-03 LAB — TSH: TSH: 9.41 u[IU]/mL — ABNORMAL HIGH (ref 0.450–4.500)

## 2022-04-03 LAB — T4, FREE: Free T4: 1.3 ng/dL (ref 0.82–1.77)

## 2022-04-04 ENCOUNTER — Other Ambulatory Visit: Payer: Self-pay | Admitting: Nurse Practitioner

## 2022-04-04 DIAGNOSIS — E039 Hypothyroidism, unspecified: Secondary | ICD-10-CM

## 2022-04-04 MED ORDER — LEVOTHYROXINE SODIUM 75 MCG PO TABS: 75.0000 ug | ORAL_TABLET | Freq: Every day | ORAL | 3 refills | Status: DC

## 2022-04-05 ENCOUNTER — Telehealth: Payer: Self-pay

## 2022-04-05 NOTE — Progress Notes (Signed)
  Chronic Care Management   Note  04/05/2022 Name: Kyle Farley MRN: [id number] DOB: Feb 18, 1940  Kyle Farley is a 82 y.o. year old male who is a primary care patient of Rip Harbour, NP (Inactive). I reached out to [name] by phone today in response to a referral sent by Kyle Farley's PCP.  The first contact attempt was unsuccessful.   Follow up plan: Additional outreach attempts will be made.  Noreene Larsson, [place], [place] [address] Direct Dial: [phone] [email or web address]

## 2022-04-06 ENCOUNTER — Other Ambulatory Visit: Payer: Self-pay

## 2022-04-06 DIAGNOSIS — J449 Chronic obstructive pulmonary disease, unspecified: Secondary | ICD-10-CM

## 2022-04-06 DIAGNOSIS — E039 Hypothyroidism, unspecified: Secondary | ICD-10-CM

## 2022-04-06 MED ORDER — IPRATROPIUM-ALBUTEROL 0.5-2.5 (3) MG/3ML IN SOLN: 3.0000 mL | Freq: Four times a day (QID) | RESPIRATORY_TRACT | 1 refills | Status: DC | PRN

## 2022-04-12 NOTE — Progress Notes (Signed)
  Chronic Care Management   Note  04/12/2022 Name: Kyle Farley MRN: [id number] DOB: Jan 11, 1941  Kyle Farley is a 82 y.o. year old male who is a primary care patient of Rip Harbour, NP (Inactive). I reached out to Kyle Farley by phone today in response to a referral sent by Kyle Farley's PCP.  Kyle Farley was given information about Chronic Care Management services today including:  CCM service includes personalized support from designated clinical staff supervised by the physician, including individualized plan of care and coordination with other care providers 24/7 contact phone numbers for assistance for urgent and routine care needs. Service will only be billed when office clinical staff spend 20 minutes or more in a month to coordinate care. Only one practitioner may furnish and bill the service in a calendar month. The patient may stop CCM services at amy time (effective at the end of the month) by phone call to the office staff. The patient will be responsible for cost sharing (co-pay) or up to 20% of the service fee (after annual deductible is met)  Kyle Farley  agreedto scheduling an appointment with the CCM RN Case Manager   Follow up plan: Patient agreed to scheduled appointment with RN Case Manager on 04/15/2022 pharm d 05/04/2022(date/time).   Noreene Larsson, [place], [place] [address] Direct Dial: [phone] [email or web address]

## 2022-04-12 NOTE — Progress Notes (Signed)
This encounter was created in error - please disregard.

## 2022-04-15 ENCOUNTER — Telehealth: Payer: Medicare HMO

## 2022-04-15 ENCOUNTER — Other Ambulatory Visit: Payer: Self-pay | Admitting: Family Medicine

## 2022-04-15 ENCOUNTER — Ambulatory Visit (INDEPENDENT_AMBULATORY_CARE_PROVIDER_SITE_OTHER): Payer: Medicare HMO

## 2022-04-15 DIAGNOSIS — Z7951 Long term (current) use of inhaled steroids: Secondary | ICD-10-CM

## 2022-04-15 DIAGNOSIS — I5023 Acute on chronic systolic (congestive) heart failure: Secondary | ICD-10-CM

## 2022-04-15 DIAGNOSIS — I480 Paroxysmal atrial fibrillation: Secondary | ICD-10-CM

## 2022-04-15 DIAGNOSIS — I10 Essential (primary) hypertension: Secondary | ICD-10-CM

## 2022-04-15 MED ORDER — MONTELUKAST SODIUM 10 MG PO TABS: 10.0000 mg | ORAL_TABLET | Freq: Every day | ORAL | 1 refills | Status: DC

## 2022-04-15 MED ORDER — TORSEMIDE 20 MG PO TABS: 20.0000 mg | ORAL_TABLET | Freq: Two times a day (BID) | ORAL | 1 refills | Status: DC | PRN

## 2022-04-15 MED ORDER — AMLODIPINE BESYLATE 5 MG PO TABS: 5.0000 mg | ORAL_TABLET | Freq: Every day | ORAL | 1 refills | Status: DC

## 2022-04-15 NOTE — Plan of Care (Signed)
Chronic Care Management Provider Comprehensive Care Plan    04/15/2022 Name: Kyle Farley MRN: [id number] DOB: 1940-03-25  Referral to Chronic Care Management (CCM) services was placed by Provider:  Neil Crouch on Date: 04-02-2022 .  Chronic Condition 1: COPD Provider Assessment and Plan Moderately controlled, SHORTNESS OF BREATH and wheezing at times Wheezing presents in clinic/ Peak Flow assessed and Duoneb provided Refilled all COPD meds Increased the dose of trelegy OD Taking 2 l oxygen at night as needed Chronic smoker and refused to quit LDCT in place and referral to pulmonology in place     Expected Outcome/Goals Addressed This Visit (Provider CCM goals/Provider Assessment and plan    CCM (COPD) EXPECTED OUTCOME: MONITOR, SELF-MANAGE AND REDUCE SYMPTOMS OF COPD   Symptom Management Condition 1: Take all medications as prescribed Attend all scheduled provider appointments Call provider office for new concerns or questions  call the Suicide and Crisis Lifeline: 988 call the [place] National Suicide Prevention Lifeline: 5732527665 or TTY: 606-371-0087 TTY [phone]) to talk to a trained counselor call 1-800-273-TALK (toll free, 24 hour hotline) go to [facility] [address], [place] [phone]) if experiencing a [place] or [place]  identify and remove indoor air pollutants limit outdoor activity during cold weather listen for public air quality announcements every day do breathing exercises every day begin a symptom diary develop a rescue plan eliminate symptom triggers at home follow rescue plan if symptoms flare-up  Chronic Condition 2: Heart Failure Provider Assessment and Plan Presence of BLLE edema Advised to elevate leg and compression socks as needed Taking Torsemide 20 mg as needed Amiodarone 2 times daily Eliquis 5 mg daily Lisinopril 20 mg OD Metoprolol 50 mg daily   Expected  Outcome/Goals Addressed This Visit (Provider CCM goals/Provider Assessment and plan   CCM (HEART FAILURE)  EXPECTED OUTCOME:  MONITOR, SELF-MANAGE AND REDUCE SYMPTOMS OF HEART FAILURE   Symptom Management Condition 2: Take all medications as prescribed Attend all scheduled provider appointments Call provider office for new concerns or questions  call the Suicide and Crisis Lifeline: 988 call the [place] National Suicide Prevention Lifeline: (714) 064-1572 or TTY: (956)436-4238 [place] [phone]) to talk to a trained counselor call 1-800-273-TALK (toll free, 24 hour hotline) go to [facility] [address] [place] [phone]) if experiencing a Mental Health or [place]  call office if I gain more than 2 pounds in one day or 5 pounds in one week keep legs up while sitting use salt in moderation watch for swelling in feet, ankles and legs every day weigh myself daily develop a rescue plan follow rescue plan if symptoms flare-up  Chronic Condition 3: AFIB Provider Assessment and Plan Atrial fibrillation status: uncontrolled Satisfied with current treatment: yes  Medication side effects:  no Medication compliance: good compliance Etiology of atrial fibrillation:  Palpitations:  no Chest pain:  no Dyspnea on exertion:  yes when walking stairs  Orthopnea:  yes Syncope:  no Edema:  yes- bilateral edema Ventricular rate control: B-blocker Anti-coagulation:  Eliquis  , amiodarone 200mg  2 times daily,      Expected Outcome/Goals Addressed This Visit (Provider CCM goals/Provider Assessment and plan   CCM (AFIB)  EXPECTED OUTCOME:  MONITOR, SELF- MANAGE AND REDUCE SYMPTOMS OF AFIB   Symptom Management Condition 3: Take all medications as prescribed Attend all scheduled provider appointments Call provider office for new concerns or questions  call the Suicide and Crisis Lifeline: 988 call  the [place] National Suicide Prevention  Lifeline: 425-298-0715 or TTY: 647-409-0341 TTY 8062210574) to talk to a trained counselor call 1-800-273-TALK (toll free, 24 hour hotline) go to [facility] [address] [place] [phone]) if experiencing a Mental Health or [place]  make a plan to eat healthy keep all lab appointments take medicine as prescribed  Chronic Condition 4: HTN Provider Assessment and Plan AMB Referral to Chronic Care Management Services Stressed importance of moderation in sodium intake, saturated fat and cholesterol, caloric balance, sufficient intake of complex carbohydrates, fiber, calcium and iron.   Exercise: Stressed the importance of regular exercise.          Expected Outcome/Goals Addressed This Visit (Provider CCM goals/Provider Assessment and plan   CCM (HYPERTENSION)  EXPECTED OUTCOME:  MONITOR,SELF- MANAGE AND REDUCE SYMPTOMS OF HYPERTENSION  Symptom Management Condition 4: Take all medications as prescribed Attend all scheduled provider appointments Call provider office for new concerns or questions  call the Suicide and Crisis Lifeline: 988 call the [place] National Suicide Prevention Lifeline: (309) 143-7124 or TTY: 5811890567 TTY (640) 701-7386) to talk to a trained counselor call 1-800-273-TALK (toll free, 24 hour hotline) go to [facility] [address], [place] [phone]) if experiencing a Mental Health or [place]  check blood pressure weekly learn about high blood pressure call doctor for signs and symptoms of high blood pressure keep all doctor appointments take medications for blood pressure exactly as prescribed report new symptoms to your doctor  Problem List Patient Active Problem List   Diagnosis Date Noted   Encounter for prostate cancer screening 02/16/2022   Encounter for screening for lung cancer 02/16/2022   Declined smoking cessation  02/16/2022   Bilateral leg edema 02/16/2022   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 ([place]) 11/10/2021   Acute on chronic systolic heart failure ([place]) 11/10/2021   Alcohol abuse with alcohol-induced mood disorder ([place]) 11/10/2021   Atherosclerosis of native arteries of extremities with intermittent claudication, bilateral legs ([place]) 11/10/2021   Benign prostatic hyperplasia without lower urinary tract symptoms 11/10/2021   Hypothyroidism 11/10/2021   Pressure injury of skin 09/16/2020   Femur fracture, right ([place]) 09/12/2020   Alcohol use 09/12/2020   Hypertension    History of kidney stones    GERD (gastroesophageal reflux disease)    Dysrhythmia    Dyspnea    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 ([place])    Arthritis    COPD exacerbation ([place]) 01/24/2017   Kidney stones 01/24/2017   Hyperlipidemia 01/24/2017   CHF (congestive heart failure) ([place]) 01/24/2017   Paroxysmal atrial fibrillation ([place]) 10/21/2014   Essential hypertension 10/21/2014   Cigarette smoker 10/21/2014    Medication Management  Current Outpatient Medications:    albuterol (VENTOLIN HFA) 108 (90 Base) MCG/ACT inhaler, Inhale 2 puffs into the lungs every 4 (four) hours as needed for wheezing or shortness of breath., Disp: 1 each, Rfl: 2   amiodarone (PACERONE) 200 MG tablet, Take 200 mg by mouth 2 (two) times daily., Disp: , Rfl:    amLODipine (NORVASC) 5 MG tablet, Take 1 tablet (5 mg total) by mouth daily., Disp: 90 tablet, Rfl: 0   apixaban (ELIQUIS) 5 MG TABS tablet, Take 1 tablet (5 mg total) by mouth 2 (two) times daily., Disp: 60 tablet, Rfl: 11   atorvastatin (LIPITOR) 40 MG tablet, Take 1 tablet (40 mg total) by mouth daily., Disp: 90 tablet, Rfl: 3   Fluticasone-Umeclidin-Vilant (TRELEGY ELLIPTA) 200-62.5-25 MCG/ACT AEPB,  Inhale 2 puffs into the lungs daily., Disp: 60 each, Rfl: 1   ipratropium-albuterol (DUONEB) 0.5-2.5 (3) MG/3ML SOLN, Inhale 3 mLs into the lungs every 6 (six) hours as needed.,  Disp: 360 mL, Rfl: 1   levothyroxine (SYNTHROID) 75 MCG tablet, Take 1 tablet (75 mcg total) by mouth daily., Disp: 90 tablet, Rfl: 3   lisinopril (ZESTRIL) 40 MG tablet, Take 20 mg by mouth daily at 12 noon., Disp: , Rfl:    Magnesium 400 MG CAPS, Take 400 mg by mouth daily., Disp: , Rfl:    metoprolol succinate (TOPROL-XL) 50 MG 24 hr tablet, Take 1 tablet (50 mg total) by mouth daily. Take with or immediately following a meal., Disp: 30 tablet, Rfl: 6   montelukast (SINGULAIR) 10 MG tablet, Take 10 mg by mouth daily at 12 noon., Disp: , Rfl:    potassium chloride SA (KLOR-CON) 20 MEQ tablet, Take 20 mEq by mouth daily., Disp: , Rfl:    tamsulosin (FLOMAX) 0.4 MG CAPS capsule, Take 0.4 mg by mouth daily., Disp: , Rfl:    thiamine (VITAMIN B1) 100 MG tablet, Take 100 mg by mouth daily., Disp: , Rfl:    torsemide (DEMADEX) 20 MG tablet, Take 1 tablet (20 mg total) by mouth as directed., Disp: 60 tablet, Rfl: 0  Cognitive Assessment Identity Confirmed: : Name; DOB Cognitive Status: Normal   Functional Assessment Hearing Difficulty or Deaf: yes Hearing Management: hard of hearing, ordered ones from TV but threw them away they didn't work Manufacturing engineer or Blind: yes Vision Management: wears glasses Concentrating, Remembering or Making Decisions Difficulty (CP): no Difficulty Communicating: no Difficulty Eating/Swallowing: no Walking or Climbing Stairs Difficulty: yes Walking or Climbing Stairs: ambulation difficulty, requires equipment Mobility Management: uses a cane or a walker when ambulating Dressing/Bathing Difficulty: yes Dressing/Bathing Management: has to go slow and sometimes it takes him a while to get dressed Doing Errands Independently Difficulty (such as shopping) (CP): yes Errands Management: his son Kerry Dory and friends sometimes help with appointments and getting the patient things he needs. The patient does drive but doesn't as much as he use to   Caregiver Assessment   Primary Source of Support/Comfort: child(ren) Name of Support/Comfort Primary Source: Derrill Kay People in Home: child(ren), adult Family Caregiver if Needed: child(ren), adult Family Caregiver Names: Lives with his son Kerry Dory Primary Roles/Responsibilities: retired   Planned Interventions  Provider order and care plan reviewed. Collaborated with PharmD regarding patient care and plan. Counseled on increased risk of stroke due to Afib and benefits of anticoagulation for stroke prevention           Reviewed importance of adherence to anticoagulant exactly as prescribed Advised patient to discuss changes in AFIB, questions or concerns with provider Counseled on bleeding risk associated with AFIB and importance of self-monitoring for signs/symptoms of bleeding Counseled on avoidance of NSAIDs due to increased bleeding risk with anticoagulants Counseled on importance of regular laboratory monitoring as prescribed Counseled on seeking medical attention after a head injury or if there is blood in the urine/stool Afib action plan reviewed Screening for signs and symptoms of depression related to chronic disease state Assessed social determinant of health barriers Basic overview and discussion of pathophysiology of Heart Failure reviewed Provided education on low sodium diet. The patient states he is mindful of sodium but his sodium levels have been low. Review of heart healthy diet and will send information in the mail to the patient Reviewed Heart Failure Action Plan in depth and provided  written copy Assessed need for readable accurate scales in home. The patient states that he does not weigh himself, he use to but only weighs now when he goes to the doctor.  Discussed importance of daily weight and advised patient to weigh and record daily. The patient states he can tell if he has more water weight on board by how hard it is to get his shoes on. He no longer wears socks. Education on possibly  monitoring his weight daily as this is an indicator of exacerbation of heart failure and may be one of the reason his is having more shortness of breath recently. Review of calling the provider for +2 or +3 pounds in one day and + 5 pounds in one week.  Reviewed role of diuretics in prevention of fluid overload and management of heart failure. The patient is currently out of his Torsemide 20 mg. In basket message sent to the provider to see if a refill could be sent in. The patient saw the pcp on 04-02-2022. Has initial outreach with the pharm D on 05-04-2022 at 1 pm. Reminder given today. Discussed the importance of keeping all appointments with provider Advised patient to discuss changes in fluid balance, changes in HF, questions, or concerns with provider Screening for signs and symptoms of depression related to chronic disease state  Assessed social determinant of health barriers EF 50 to 55% on 06-10-2021, education provided on what the EF% was and how it impacts his chronic conditions Discussed getting some zipper compression socks to try to help with the edema in his bilateral legs. He cannot wear socks due to them being too tight. Education on the availability of zipper compression socks by ordering through [employer]. They can be purchased for less that 15.00 and they can be open toe or closed toe. The patient states he tried the traditional before but could not get them on and he does not wear.  Evaluation of current treatment plan related to hypertension self management and patient's adherence to plan as established by provider;   Provided education to patient re: stroke prevention, s/s of heart attack and stroke; Reviewed prescribed diet heart healthy diet. The patient states that his sodium is low and they tell him it is okay to eat salt. Does have issues with swelling and edema in his feet and legs. The patient states he and his son eat a lot of fast foods. Education on the benefits of eating fresh  fruits and vegetables. Will attach heart healthy eating plan to the AVS for the patient to review.  Reviewed medications with patient and discussed importance of compliance;  Discussed plans with patient for ongoing care management follow up and provided patient with direct contact information for care management team; Advised patient, providing education and rationale, to monitor blood pressure daily and record, calling PCP for findings outside established parameters;  Reviewed scheduled/upcoming provider appointments including: 05-18-2022 for labs, knows to call the provider sooner for new needs. Sees cardiologist on a regular basis.  Advised patient to discuss changes in his HTN or  heart health with provider; Provided education on prescribed diet heart healthy diet;  Discussed complications of poorly controlled blood pressure such as heart disease, stroke, circulatory complications, vision complications, kidney impairment, sexual dysfunction;  Screening for signs and symptoms of depression related to chronic disease state;  Assessed social determinant of health barriers;  Provided patient with basic written and verbal COPD education on self care/management/and exacerbation prevention Advised patient to track and manage  COPD triggers Provided written and verbal instructions on pursed lip breathing and utilized returned demonstration as teach back Provided instruction about proper use of medications used for management of COPD including inhalers Advised patient to self assesses COPD action plan zone and make appointment with provider if in the yellow zone for 48 hours without improvement Advised patient to engage in light exercise as tolerated 3-5 days a week to aid in the the management of COPD Provided education about and advised patient to utilize infection prevention strategies to reduce risk of respiratory infection Discussed the importance of adequate rest and management of fatigue with  COPD Screening for signs and symptoms of depression related to chronic disease state  Assessed social determinant of health barriers  Interaction and coordination with outside resources, practitioners, and providers See CCM Referral  Care Plan: Printed and mailed to patient

## 2022-04-15 NOTE — Patient Instructions (Addendum)
Please call the care guide team at [phone] if you need to cancel or reschedule your appointment.   If you are experiencing a Mental Health or [place] or need someone to talk to, please call the Suicide and Crisis Lifeline: 988 call the [place] National Suicide Prevention Lifeline: 507-045-8387 or TTY: 323-622-6292 TTY 563-543-4679) to talk to a trained counselor call 1-800-273-TALK (toll free, 24 hour hotline) go to [facility] [place] [phone])   Following is a copy of the CCM Program Consent:  CCM service includes personalized support from designated clinical staff supervised by the physician, including individualized plan of care and coordination with other care providers 24/7 contact phone numbers for assistance for urgent and routine care needs. Service will only be billed when office clinical staff spend 20 minutes or more in a month to coordinate care. Only one practitioner may furnish and bill the service in a calendar month. The patient may stop CCM services at amy time (effective at the end of the month) by phone call to the office staff. The patient will be responsible for cost sharing (co-pay) or up to 20% of the service fee (after annual deductible is met)  Following is a copy of your full provider care plan:   Goals Addressed             This Visit's Progress    CCM Expected Outcome:  Monitor, Self-Manage and Reduce Symptoms of Afib       Current Barriers:  Chronic Disease Management support and education needs related to effective management of AFIB  Planned Interventions: Provider order and care plan reviewed. Collaborated with PharmD regarding patient care and plan. Counseled on increased risk of stroke due to Afib and benefits of anticoagulation for stroke prevention           Reviewed importance of adherence to anticoagulant exactly as prescribed Advised patient to discuss changes in  AFIB, questions or concerns with provider Counseled on bleeding risk associated with AFIB and importance of self-monitoring for signs/symptoms of bleeding Counseled on avoidance of NSAIDs due to increased bleeding risk with anticoagulants Counseled on importance of regular laboratory monitoring as prescribed Counseled on seeking medical attention after a head injury or if there is blood in the urine/stool Afib action plan reviewed Screening for signs and symptoms of depression related to chronic disease state Assessed social determinant of health barriers  Symptom Management: Take medications as prescribed   Attend all scheduled provider appointments Call provider office for new concerns or questions  call the Suicide and Crisis Lifeline: 988 call the [place] National Suicide Prevention Lifeline: 778-494-6986 or TTY: (980)803-7987 TTY 640 570 9429) to talk to a trained counselor call 1-800-273-TALK (toll free, 24 hour hotline) go to [facility] [address] [place] [phone]) if experiencing a Mental Health or [place]  - make a plan to eat healthy - keep all lab appointments - take medicine as prescribed  Follow Up Plan: Telephone follow up appointment with care management team member scheduled for: 06-16-2022 at 1 pm       CCM Expected Outcome:  Monitor, Self-Manage and Reduce Symptoms of Heart Failure       Current Barriers:  Knowledge Deficits related to the benefits of weighing daily to monitor for excess fluid and to prevent an exacerbation of HF Care Coordination needs related to education and support from pharm D for medication management and support  in a patient with HF  Chronic Disease Management support and education needs related to effective management of HF  Planned Interventions: Basic overview and discussion of pathophysiology of Heart Failure reviewed Provided education on low sodium diet. The patient states  he is mindful of sodium but his sodium levels have been low. Review of heart healthy diet and will send information in the mail to the patient Reviewed Heart Failure Action Plan in depth and provided written copy Assessed need for readable accurate scales in home. The patient states that he does not weigh himself, he use to but only weighs now when he goes to the doctor.  Discussed importance of daily weight and advised patient to weigh and record daily. The patient states he can tell if he has more water weight on board by how hard it is to get his shoes on. He no longer wears socks. Education on possibly monitoring his weight daily as this is an indicator of exacerbation of heart failure and may be one of the reason his is having more shortness of breath recently. Review of calling the provider for +2 or +3 pounds in one day and + 5 pounds in one week.  Reviewed role of diuretics in prevention of fluid overload and management of heart failure. The patient is currently out of his Torsemide 20 mg. In basket message sent to the provider to see if a refill could be sent in. The patient saw the pcp on 04-02-2022. Has initial outreach with the pharm D on 05-04-2022 at 1 pm. Reminder given today. Discussed the importance of keeping all appointments with provider Advised patient to discuss changes in fluid balance, changes in HF, questions, or concerns with provider Screening for signs and symptoms of depression related to chronic disease state  Assessed social determinant of health barriers EF 50 to 55% on 06-10-2021, education provided on what the EF% was and how it impacts his chronic conditions Discussed getting some zipper compression socks to try to help with the edema in his bilateral legs. He cannot wear socks due to them being too tight. Education on the availability of zipper compression socks by ordering through [employer]. They can be purchased for less that 15.00 and they can be open toe or closed toe. The  patient states he tried the traditional before but could not get them on and he does not wear.   Symptom Management: Take medications as prescribed   Attend all scheduled provider appointments Call provider office for new concerns or questions  call the Suicide and Crisis Lifeline: 988 call the [place] National Suicide Prevention Lifeline: 475-339-9624 or TTY: 930 607 6189 TTY (302) 016-8214) to talk to a trained counselor call 1-800-273-TALK (toll free, 24 hour hotline) go to [facility] [address], [place] [phone]) if experiencing a Mental Health or [place]  call office if I gain more than 2 pounds in one day or 5 pounds in one week use salt in moderation watch for swelling in feet, ankles and legs every day weigh myself daily develop a rescue plan follow rescue plan if symptoms flare-up  Follow Up Plan: Telephone follow up appointment with care management team member scheduled for: 06-16-2022 at 1 pm       CCM Expected Outcome:  Monitor, Self-Manage, and Reduce Symptoms of Hypertension       Current Barriers:  Knowledge Deficits related to the importance of calling the providers when out of medications or changes in HTN or heart health Care Coordination needs related to pharmacy support  and education, referral pending in a patient with HTN and other chronic conditions Chronic Disease Management support and education needs related to effective management of HTN BP Readings from Last 3 Encounters:  04/02/22 132/62  03/10/22 (!) 140/64  02/16/22 120/70     Planned Interventions: Evaluation of current treatment plan related to hypertension self management and patient's adherence to plan as established by provider;   Provided education to patient re: stroke prevention, s/s of heart attack and stroke; Reviewed prescribed diet heart healthy diet. The patient states that his sodium is low and they tell him it is okay to eat  salt. Does have issues with swelling and edema in his feet and legs. The patient states he and his son eat a lot of fast foods. Education on the benefits of eating fresh fruits and vegetables. Will attach heart healthy eating plan to the AVS for the patient to review.  Reviewed medications with patient and discussed importance of compliance;  Discussed plans with patient for ongoing care management follow up and provided patient with direct contact information for care management team; Advised patient, providing education and rationale, to monitor blood pressure daily and record, calling PCP for findings outside established parameters;  Reviewed scheduled/upcoming provider appointments including: 05-18-2022 for labs, knows to call the provider sooner for new needs. Sees cardiologist on a regular basis.  Advised patient to discuss changes in his HTN or  heart health with provider; Provided education on prescribed diet heart healthy diet;  Discussed complications of poorly controlled blood pressure such as heart disease, stroke, circulatory complications, vision complications, kidney impairment, sexual dysfunction;  Screening for signs and symptoms of depression related to chronic disease state;  Assessed social determinant of health barriers;   Symptom Management: Take medications as prescribed   Attend all scheduled provider appointments Call provider office for new concerns or questions  call the Suicide and Crisis Lifeline: 988 call the [place] National Suicide Prevention Lifeline: (306)513-7868 or TTY: (938) 285-7529 TTY 775-807-3852) to talk to a trained counselor call 1-800-273-TALK (toll free, 24 hour hotline) go to [facility] [address] [place] [phone]) if experiencing a Mental Health or [place]  check blood pressure weekly learn about high blood pressure call doctor for signs and symptoms of high blood pressure develop an  action plan for high blood pressure keep all doctor appointments take medications for blood pressure exactly as prescribed report new symptoms to your doctor  Follow Up Plan: Telephone follow up appointment with care management team member scheduled for: 06-16-2022 at 1 pm       CCM:  Maintain, Monitor and Self-Manage Symptoms of COPD       Current Barriers:  Knowledge Deficits related to the importance of following the plan of care and taking medications as directed for effective management of COPD Care Coordination needs related to pharmacy needs  in a patient with COPD Chronic Disease Management support and education needs related to effective management of COPD Current every day smoker of 1/2 to 1 PPD  Uses oxygen 2 liters at night, sleeps in a recliner due to inability to lay down because of shortness of breath  Planned Interventions: Provided patient with basic written and verbal COPD education on self care/management/and exacerbation prevention Advised patient to track and manage COPD triggers Provided written and verbal instructions on pursed lip breathing and utilized returned demonstration as teach back Provided instruction about proper use of medications used for management of COPD including inhalers Advised  patient to self assesses COPD action plan zone and make appointment with provider if in the yellow zone for 48 hours without improvement Advised patient to engage in light exercise as tolerated 3-5 days a week to aid in the the management of COPD Provided education about and advised patient to utilize infection prevention strategies to reduce risk of respiratory infection Discussed the importance of adequate rest and management of fatigue with COPD Screening for signs and symptoms of depression related to chronic disease state  Assessed social determinant of health barriers  Symptom Management: Take medications as prescribed   Attend all scheduled provider  appointments Call provider office for new concerns or questions  call the Suicide and Crisis Lifeline: 988 call the [place] National Suicide Prevention Lifeline: 802-559-7353 or TTY: (318)636-2803 TTY 605 481 5863) to talk to a trained counselor call 1-800-273-TALK (toll free, 24 hour hotline) go to [facility] [address], [place] [phone]) if experiencing a [place] or [place]  identify and remove indoor air pollutants limit outdoor activity during cold weather listen for public air quality announcements every day develop a rescue plan eliminate symptom triggers at home follow rescue plan if symptoms flare-up  Follow Up Plan: Telephone follow up appointment with care management team member scheduled for: 06-16-2022 at 1 pm          The patient verbalized understanding of instructions, educational materials, and care plan provided today and agreed to receive a mailed copy of patient instructions, educational materials, and care plan.   Telephone follow up appointment with care management team member scheduled for: 06-16-2022 at 1 pm   Heart Failure Exacerbation  Heart failure is a condition in which the heart has trouble pumping blood. This may mean that the heart cannot pump enough blood out to the body or that the heart does not fill up with enough blood. When this happens, parts of the body do not get the blood and oxygen they need to function properly. This can cause symptoms such as breathing problems, tiredness (fatigue), swelling, and confusion. Heart failure exacerbation refers to heart failure symptoms that get worse. The symptoms may get worse suddenly or develop slowly over time. Heart failure exacerbation is a serious medical problem that should be treated right away. What are the causes? A heart failure exacerbation can be triggered by: Not taking your heart failure medicines correctly. Infections. Eating  an unhealthy diet or a diet that is high in salt (sodium). Drinking too much fluid. Drinking alcohol. Using drugs, such as cocaine or methamphetamine. Not exercising. Other causes include: Other heart conditions such as an irregular heart rhythm (arrhythmia). Worsening heart valve function. Low blood counts (anemia). Other medical problems, such as kidney failure, thyroid problems, or diabetes mellitus. Sometimes the cause of the exacerbation is not known. What are the signs or symptoms? When heart failure symptoms suddenly or slowly get worse, this may be a sign of heart failure exacerbation. Symptoms of heart failure include: Shortness of breath during activity or exercise. A cough that does not go away. Swelling of the legs, ankles, feet, or abdomen. Losing or gaining weight for no reason. Trouble breathing when lying down. Increased heart rate or irregular heartbeat. Fatigue. Feeling light-headed, dizzy, or close to fainting. Nausea or lack of appetite. How is this diagnosed? This condition is diagnosed based on: Your symptoms and medical history. A physical exam. You may also have tests, including: Electrocardiogram (ECG). This test measures the electrical activity of your heart.  Echocardiogram. This test uses sound waves to take a picture of your heart to see how well it works. Blood tests. Imaging tests, such as: Chest X-ray. MRI. Ultrasound. Stress test. This test examines how well your heart functions while you exercise on a treadmill or exercise bike. If you cannot exercise, medicines may be used to increase your heartbeat in place of exercise. Cardiac catheterization. During this test, a thin, flexible tube (catheter) is inserted into a blood vessel and threaded up to your heart. This test allows your health care provider to check the arteries that lead to your heart (coronary arteries). Right heart catheterization. During this test, the pressure in your heart is  measured. How is this treated? This condition may be treated by: Adjusting your heart medicines. Maintaining a healthy lifestyle. This includes: Eating a heart-healthy diet that is low in sodium. Not using products that contain nicotine or tobacco. Regular exercise. Monitoring your fluid intake. Monitoring your weight and reporting changes to your health care provider. Not using alcohol or drugs. Treating sleep apnea, if you have this condition. Surgery. This may include: Placing a pacemaker to improve heart function (cardiac resynchronization therapy). Implanting a device that can correct heart rhythm problems (implantable cardioverter defibrillator). Implanting a pulmonary arterial pressure monitor to monitor your fluid balance. Connecting a device to your heart to help it pump blood (ventricular assist device). Heart transplant. Follow these instructions at home: Medicines Take over-the-counter and prescription medicines only as told by your health care provider. Do not stop taking your medicines or change the amount you take. If you are having problems or side effects from your medicines, talk to your health care provider. If you are having difficulty paying for your medicines, contact a social worker or your clinic. There are many programs to assist with medicine costs. Talk to your health care provider before starting any new medicines or supplements. Make sure your health care provider and pharmacist have a list of all the medicines you are taking. Eating and drinking  Avoid drinking alcohol. Eat a heart-healthy diet as told by your health care provider. This includes: Plenty of fruits and vegetables. Lean proteins. Low-fat dairy. Whole grains. Foods that are low in sodium. Activity  Exercise regularly as told by your health care provider. Balance exercise with rest. Ask your health care provider what activities are safe for you. This includes sexual activity, exercise,  and daily tasks at home or work. Lifestyle Do not use any products that contain nicotine or tobacco. These products include cigarettes, chewing tobacco, and vaping devices, such as e-cigarettes. If you need help quitting, ask your health care provider. Maintain a healthy weight. Ask your health care provider what weight is healthy for you. Consider joining a patient support group. This can help with emotional problems you may have, such as stress and anxiety. Do not use drugs. General instructions Stay up to date with vaccines. Talk to your health care provider about flu and pneumonia vaccines. Keep a list of medicines that you are taking. This may help in emergency situations. Keep all follow-up visits. This is important. Contact a health care provider if: You have questions about your medicines or you miss a dose. You feel anxious, depressed, or stressed. You develop swelling in your feet, ankles, legs, or abdomen. You develop a cough. You have a fever. You have trouble sleeping. You gain 2-3 lb (1-1.4 kg) in 24 hours or 5 lb (2.3 kg) in a week. Get help right away if: You have  chest pain or pressure. You have shortness of breath while resting. You have severe fatigue. You are confused. You have severe dizziness. You have a rapid or irregular heartbeat. You have nausea or you vomit. You have a cough that is worse at night or you cannot lie flat. You have severe depression or sadness. These symptoms may represent a serious problem that is an emergency. Do not wait to see if the symptoms will go away. Get medical help right away. Call your local emergency services (911 in the [place] Do not drive yourself to the hospital. Summary When heart failure symptoms get worse, it is called heart failure exacerbation. Common causes of this condition include taking medicines incorrectly, infections, and drinking alcohol. This condition may be treated by adjusting medicines, maintaining a healthy  lifestyle, or surgery. Do not stop taking your medicines or change the amount you take. If you are having problems or side effects from your medicines, talk to your health care provider. This information is not intended to replace advice given to you by your health care provider. Make sure you discuss any questions you have with your health care provider. Document Revised: 04/14/2021 Document Reviewed: 07/28/2019 Elsevier Patient Education  [place]. How to Use Compression Stockings  Compression stockings are elastic socks that help increase blood flow (circulation) to the legs, decrease swelling in the legs, and reduce the chance of developing blood clots in the lower legs. Compression stockings squeeze or apply pressure to the legs. The stockings are graduated, meaning the highest amount of pressure occurs at the toes and it decreases going toward the upper part of the leg. This helps ensure proper circulation through the veins. Compression stockings are often used by people who: Are recovering from surgery. The stockings help prevent blood clots after surgery. Have poor circulation or swelling in their legs because of a medical condition, such as chronic venous insufficiency, venous stasis, or lymphedema. Have a history of getting blood clots in their legs. Have bulging (varicose) veins. Sit or stay in bed for long periods of time (immobilization). Stand for long periods of time and experience leg pain or fatigue. Follow instructions from your health care provider about how and when to wear your compression stockings. What are the risks? Generally, compression stockings are safe to wear. However, problems may occur for some people, such as: The stockings being ineffective at increasing the circulation to the legs, decreasing swelling in the legs, or reducing the chance of developing blood clots in the lower legs. Skin complications, including breaks in the skin, open wounds, blisters,  or dermatitis. How to wear compression stockings Before you put on your compression stockings: Make sure that they are the correct size and degree of compression. If you do not know your size or required grade of compression, ask your health care provider and follow the manufacturer's instructions that come with the stockings. Be sure they are the appropriate length for your medical needs. Compression stockings come in different lengths, including knee high, thigh high, and even up to the waist. Make sure that the stockings are clean, dry, and in good condition. Check the stockings for rips and tears. Do not put them on if they are ripped or torn. Put your stockings on first thing in the morning, before you get out of bed. Keep them on for as long as your health care provider advises. Most people are told to remove their compression stockings at the end of the day before bed. When you  are wearing your stockings: Keep them as smooth as possible. Do not allow them to bunch up. It is especially important to prevent the stockings from bunching up around your toes or behind your knees. Make sure that the toe holes are underneath the toes and the heel patches are positioned at the heels. Do not roll the stockings downward and leave them rolled down. This can decrease blood flow to your legs. Change the stockings right away if they become wet or dirty or if they have a bad smell. If you have chronic leg wounds, make sure the wounds are properly covered or dressed before putting on your compression stockings. When you take off your stockings, check your legs and feet for: Open sores. Red spots or other areas of discoloration. Swelling. General tips Do not stop wearing compression stockings. Talk to your health care provider if your stockings feel too tight. Wash your stockings often with mild detergent in cold or warm water. Also wash them whenever they get dirty or have a bad smell. Do not use bleach.  Air-dry your stockings or dry them in a clothes dryer on low heat. It may be helpful to have two pairs so that you have a pair to wear while the other is being washed. Replace your stockings every 3-6 months. If skin moisturizing is part of your treatment plan, apply lotion or cream at night so that your skin will be dry when you put on the stockings in the morning. It is harder to put the stockings on when you have lotion on your legs or feet. Wear nonskid shoes or slip-resistant socks when walking while wearing compression stockings. If you have difficulty putting on or taking off the compression stockings, ask your health care provider about devices that may help make this easier. Contact a health care provider and remove your stockings if: You have a prickling or tingling feeling in your feet or legs. You have new open sores, red spots, or other skin changes on your feet or legs. You have swelling or pain that gets worse. Get help right away if: You have shortness of breath or chest pain. Your heartbeat is fast or irregular. You have new swelling, pain, or warmth in your leg. You have numbness or tingling in your lower legs that does not get better after you take the stockings off. Your toes or feet are unusually cold or turn a bluish color. You feel light-headed or dizzy. These symptoms may represent a serious problem that is an emergency. Do not wait to see if the symptoms will go away. Get medical help right away. Call your local emergency services (911 in the [place] Do not drive yourself to the hospital. Summary Compression stockings are elastic socks that are worn to treat a variety of symptoms and medical conditions such as venous insufficiency, venous stasis, or lymphedema. Compression stockings help increase blood flow (circulation) to the legs, decrease swelling in the legs, and reduce the chance of developing blood clots in the lower legs. Follow instructions from your health care  provider about how and when to wear your compression stockings. Do not stop wearing your compression stockings without talking to your health care provider first. This information is not intended to replace advice given to you by your health care provider. Make sure you discuss any questions you have with your health care provider. Document Revised: 06/26/2020 Document Reviewed: 06/26/2020 Elsevier Patient Education  [place]. Atrial Fibrillation Atrial fibrillation (AFib) is a type of  heartbeat that is irregular or fast. If you have AFib, your heart beats without any order. This makes it hard for your heart to pump blood in a normal way. AFib may come and go, or it may become a long-lasting problem. If AFib is not treated, it can put you at higher risk for stroke, heart failure, and other heart problems. What are the causes? AFib may be caused by diseases that damage the heart's electrical system. They include: High blood pressure. Heart failure. Heart valve diseases. Heart surgery. Diabetes. Thyroid disease. Kidney disease. Lung diseases, such as pneumonia or COPD. Sleep apnea. Sometimes the cause is not known. What increases the risk? You are more likely to develop AFib if: You are older. You exercise often and very hard. You have a family history of AFib. You are male. You are Caucasian. You are overweight. You smoke. You drink a lot of alcohol. What are the signs or symptoms? Common symptoms of this condition include: A feeling that your heart is beating very fast. Chest pain or discomfort. Feeling short of breath. Suddenly feeling light-headed or weak. Getting tired easily during activity. Fainting. Sweating. In some cases, there are no symptoms. How is this treated? Medicines to: Prevent blood clots. Treat heart rate or heart rhythm problems. Using devices, such as a pacemaker, to correct heart rhythm problems. Doing surgery to remove the part of the  heart that sends bad signals. Closing an area where clots can form in the heart (left atrial appendage). In some cases, your doctor will treat other underlying conditions. Follow these instructions at home: Medicines Take over-the-counter and prescription medicines only as told by your doctor. Do not take any new medicines without first talking to your doctor. If you are taking blood thinners: Talk with your doctor before taking aspirin or NSAIDs, such as ibuprofen. Take your medicines as told. Take them at the same time each day. Do not do things that could hurt or bruise you. Be careful to avoid falls. Wear an alert bracelet or carry a card that says you take blood thinners. Lifestyle Do not smoke or use any products that contain nicotine or tobacco. If you need help quitting, ask your doctor. Eat heart-healthy foods. Talk with your doctor about the right eating plan for you. Exercise regularly as told by your doctor. Do not drink alcohol. Lose weight if you are overweight. General instructions If you have sleep apnea, treat it as told by your doctor. Do not use diet pills unless your doctor says they are safe for you. Diet pills may make heart problems worse. Keep all follow-up visits. Your doctor will check your heart rate and rhythm regularly. Contact a doctor if: You notice a change in the speed, rhythm, or strength of your heartbeat. You are taking a blood-thinning medicine and you get more bruising. You get tired more easily when you move or exercise. You have a sudden change in weight. Get help right away if:  You have pain in your chest. You have trouble breathing. You have side effects of blood thinners, such as blood in your vomit, poop (stool), or pee (urine), or bleeding that cannot stop. You have any signs of a stroke. "BE FAST" is an easy way to remember the main warning signs: B - Balance. Dizziness, sudden trouble walking, or loss of balance. E - Eyes. Trouble  seeing or a change in how you see. F - Face. Sudden weakness or loss of feeling in the face. The face or eyelid  may droop on one side. A - Arms.Weakness or loss of feeling in an arm. This happens suddenly and usually on one side of the body. S - Speech. Sudden trouble speaking, slurred speech, or trouble understanding what people say. T - Time.Time to call emergency services. Write down what time symptoms started. You have other signs of a stroke, such as: A sudden, very bad headache with no known cause. Feeling like you may vomit (nausea). Vomiting. A seizure. These symptoms may be an emergency. Get help right away. Call 911. Do not wait to see if the symptoms will go away. Do not drive yourself to the hospital. This information is not intended to replace advice given to you by your health care provider. Make sure you discuss any questions you have with your health care provider. Document Revised: 09/23/2021 Document Reviewed: 09/23/2021 Elsevier Patient Education  [place]. Heart Failure, Diagnosis  Heart failure means that your heart is not able to pump blood in the right way. This makes it hard for your body to work well. Heart failure is usually a long-term (chronic) condition. You must take good care of yourself and follow your treatment plan from your doctor. Different stages of heart failure have different treatment plans. The stages are: Stage A: At risk for heart failure. Stage B: Pre-heart failure. Stage C: Symptomatic heart failure. Stage D: Advanced heart failure. What are the causes? High blood pressure. Buildup of cholesterol and fat in the arteries. Heart attack. This injures the heart muscle. Heart valves that do not open and close properly. Damage of the heart muscle. This is also called cardiomyopathy. Infection of the heart muscle. This is also called myocarditis. Lung disease. What increases the risk? Getting older. The risk of heart failure goes up as  a person ages. Being overweight. Using tobacco or nicotine products. Abusing alcohol or drugs. Having taken medicines that can damage the heart. Having any of these conditions: Diabetes. Abnormal heart rhythms. Thyroid problems. Low blood counts (anemia). Having a family history of heart failure. What are the signs or symptoms? Shortness of breath. Coughing. Swelling of the feet, ankles, legs, or belly. Losing or gaining weight for no reason. Trouble breathing. Waking from sleep because of the need to sit up and get more air. Fast heartbeat. Other symptoms may include: Being very tired. Feeling dizzy, or feeling like you may pass out (faint). Having no desire to eat. Feeling like you may vomit (nauseous). Peeing (urinating) more at night. Feeling confused. How is this treated? This condition may be treated with: Medicines. These can be given to treat blood pressure and to make the heart muscles stronger. Changes in your daily life. These may include: Eating a healthy diet. Staying at a healthy body weight. Quitting tobacco, alcohol, and drug use. Doing exercises. Participating in a cardiac rehabilitation program. This program helps you improve your health through exercise, education, and counseling. Surgery. Surgery can be done to open blocked valves or to put devices in the heart, such as pacemakers. A donor heart (heart transplant). You will receive a healthy heart from a donor. Follow these instructions at home: Treat other conditions as told by your doctor. These may include high blood pressure, diabetes, thyroid disease, or abnormal heart rhythms. Learn as much as you can about heart failure. Get support as you need it. Keep all follow-up visits. Where to find more information American Heart Association: www.heart.org Centers for Disease Control and Prevention: http://www.wolf.info/ National Institute on Aging: http://kim-miller.com/ Summary Heart failure  means that your heart is  not able to pump blood in the right way. This condition is often caused by high blood pressure, heart attack, or damage of the heart muscle. Symptoms of this condition include shortness of breath and swelling of the feet, ankles, legs, or belly. You may also feel very tired or feel like you may vomit. You may be treated with medicines, surgery, or changes in your daily life. Treat other health conditions as told by your doctor. This information is not intended to replace advice given to you by your health care provider. Make sure you discuss any questions you have with your health care provider. Document Revised: 07/03/2020 Document Reviewed: 07/28/2019 Elsevier Patient Education  [place] Eating a healthy diet is important for the health of your heart. A heart-healthy eating plan includes: Eating less unhealthy fats. Eating more healthy fats. Eating less salt in your food. Salt is also called sodium. Making other changes in your diet. Talk with your doctor or a diet specialist (dietitian) to create an eating plan that is right for you. What is my plan? Your doctor may recommend an eating plan that includes: Total fat: ______% or less of total calories a day. Saturated fat: ______% or less of total calories a day. Cholesterol: less than _________mg a day. Sodium: less than _________mg a day. What are tips for following this plan? Cooking Avoid frying your food. Try to bake, boil, grill, or broil it instead. You can also reduce fat by: Removing the skin from poultry. Removing all visible fats from meats. Steaming vegetables in water or broth. Meal planning  At meals, divide your plate into four equal parts: Fill one-half of your plate with vegetables and green salads. Fill one-fourth of your plate with whole grains. Fill one-fourth of your plate with lean protein foods. Eat 2-4 cups of vegetables per day. One cup of vegetables is: 1 cup (91 g)  broccoli or cauliflower florets. 2 medium carrots. 1 large bell pepper. 1 large sweet potato. 1 large tomato. 1 medium white potato. 2 cups (150 g) raw leafy greens. Eat 1-2 cups of fruit per day. One cup of fruit is: 1 small apple 1 large banana 1 cup (237 g) mixed fruit, 1 large orange,  cup (82 g) dried fruit, 1 cup (240 mL) 100% fruit juice. Eat more foods that have soluble fiber. These are apples, broccoli, carrots, beans, peas, and barley. Try to get 20-30 g of fiber per day. Eat 4-5 servings of nuts, legumes, and seeds per week: 1 serving of dried beans or legumes equals  cup (90 g) cooked. 1 serving of nuts is  oz (12 almonds, 24 pistachios, or 7 walnut halves). 1 serving of seeds equals  oz (8 g). General information Eat more home-cooked food. Eat less restaurant, buffet, and fast food. Limit or avoid alcohol. Limit foods that are high in starch and sugar. Avoid fried foods. Lose weight if you are overweight. Keep track of how much salt (sodium) you eat. This is important if you have high blood pressure. Ask your doctor to tell you more about this. Try to add vegetarian meals each week. Fats Choose healthy fats. These include olive oil and canola oil, flaxseeds, walnuts, almonds, and seeds. Eat more omega-3 fats. These include salmon, mackerel, sardines, tuna, flaxseed oil, and ground flaxseeds. Try to eat fish at least 2 times each week. Check food labels. Avoid foods with trans fats or high amounts of saturated fat. Limit  saturated fats. These are often found in animal products, such as meats, butter, and cream. These are also found in plant foods, such as palm oil, palm kernel oil, and coconut oil. Avoid foods with partially hydrogenated oils in them. These have trans fats. Examples are stick margarine, some tub margarines, cookies, crackers, and other baked goods. What foods should I eat? Fruits All fresh, canned (in natural juice), or frozen  fruits. Vegetables Fresh or frozen vegetables (raw, steamed, roasted, or grilled). Green salads. Grains Most grains. Choose whole wheat and whole grains most of the time. Rice and pasta, including brown rice and pastas made with whole wheat. Meats and other proteins Lean, well-trimmed beef, veal, pork, and lamb. Chicken and [place] without skin. All fish and shellfish. Wild duck, rabbit, pheasant, and venison. Egg whites or low-cholesterol egg substitutes. Dried beans, peas, lentils, and tofu. Seeds and most nuts. Dairy Low-fat or nonfat cheeses, including ricotta and mozzarella. Skim or 1% milk that is liquid, powdered, or evaporated. Buttermilk that is made with low-fat milk. Nonfat or low-fat yogurt. Fats and oils Non-hydrogenated (trans-free) margarines. Vegetable oils, including soybean, sesame, sunflower, olive, peanut, safflower, corn, canola, and cottonseed. Salad dressings or mayonnaise made with a vegetable oil. Beverages Mineral water. Coffee and tea. Diet carbonated beverages. Sweets and desserts Sherbet, gelatin, and fruit ice. Small amounts of dark chocolate. Limit all sweets and desserts. Seasonings and condiments All seasonings and condiments. The items listed above may not be a complete list of foods and drinks you can eat. Contact a dietitian for more options. What foods should I avoid? Fruits Canned fruit in heavy syrup. Fruit in cream or butter sauce. Fried fruit. Limit coconut. Vegetables Vegetables cooked in cheese, cream, or butter sauce. Fried vegetables. Grains Breads that are made with saturated or trans fats, oils, or whole milk. Croissants. Sweet rolls. Donuts. High-fat crackers, such as cheese crackers. Meats and other proteins Fatty meats, such as hot dogs, ribs, sausage, bacon, rib-eye roast or steak. High-fat deli meats, such as salami and bologna. Caviar. Domestic duck and goose. Organ meats, such as liver. Dairy Cream, sour cream, cream cheese, and  creamed cottage cheese. Whole-milk cheeses. Whole or 2% milk that is liquid, evaporated, or condensed. Whole buttermilk. Cream sauce or high-fat cheese sauce. Yogurt that is made from whole milk. Fats and oils Meat fat, or shortening. Cocoa butter, hydrogenated oils, palm oil, coconut oil, palm kernel oil. Solid fats and shortenings, including bacon fat, salt pork, lard, and butter. Nondairy cream substitutes. Salad dressings with cheese or sour cream. Beverages Regular sodas and juice drinks with added sugar. Sweets and desserts Frosting. Pudding. Cookies. Cakes. Pies. Milk chocolate or white chocolate. Buttered syrups. Full-fat ice cream or ice cream drinks. The items listed above may not be a complete list of foods and drinks to avoid. Contact a dietitian for more information. Summary Heart-healthy meal planning includes eating less unhealthy fats, eating more healthy fats, and making other changes in your diet. Eat a balanced diet. This includes fruits and vegetables, low-fat or nonfat dairy, lean protein, nuts and legumes, whole grains, and heart-healthy oils and fats. This information is not intended to replace advice given to you by your health care provider. Make sure you discuss any questions you have with your health care provider. Document Revised: 02/09/2021 Document Reviewed: 02/09/2021 Elsevier Patient Education  [place].

## 2022-04-15 NOTE — Chronic Care Management (AMB) (Signed)
Chronic Care Management   CCM RN Visit Note  04/15/2022 Name: Kyle Farley MRN: [id number] DOB: Sep 24, 1940  Subjective: Kyle Farley is a 82 y.o. year old male who is a primary care patient of Rip Harbour, NP (Inactive). The patient was referred to the Chronic Care Management team for assistance with care management needs subsequent to provider initiation of CCM services and plan of care.    Today's Visit:  Engaged with patient by telephone for initial visit.     SDOH Interventions Today    Flowsheet Row Most Recent Value  SDOH Interventions   Food Insecurity Interventions Intervention Not Indicated  Housing Interventions Intervention Not Indicated  Transportation Interventions Intervention Not Indicated  Utilities Interventions Intervention Not Indicated  Financial Strain Interventions Intervention Not Indicated  Physical Activity Interventions Other (Comments)  Kyle Farley gets short of breath when ambulating. The patient states he also has a lot of swelling in his feet and legs]  Stress Interventions Intervention Not Indicated  Social Connections Interventions Intervention Not Indicated, Other (Comment)  [has good support from his family and friends]         Goals Addressed             This Visit's Progress    CCM Expected Outcome:  Monitor, Self-Manage and Reduce Symptoms of Afib       Current Barriers:  Chronic Disease Management support and education needs related to effective management of AFIB  Planned Interventions: Provider order and care plan reviewed. Collaborated with PharmD regarding patient care and plan. Counseled on increased risk of stroke due to Afib and benefits of anticoagulation for stroke prevention           Reviewed importance of adherence to anticoagulant exactly as prescribed Advised patient to discuss changes in AFIB, questions or concerns with provider Counseled on bleeding risk associated with AFIB and importance of self-monitoring for  signs/symptoms of bleeding Counseled on avoidance of NSAIDs due to increased bleeding risk with anticoagulants Counseled on importance of regular laboratory monitoring as prescribed Counseled on seeking medical attention after a head injury or if there is blood in the urine/stool Afib action plan reviewed Screening for signs and symptoms of depression related to chronic disease state Assessed social determinant of health barriers  Symptom Management: Take medications as prescribed   Attend all scheduled provider appointments Call provider office for new concerns or questions  call the Suicide and Crisis Lifeline: 988 call the [place] National Suicide Prevention Lifeline: 302 763 7399 or TTY: 8256912874 TTY 754-253-3780) to talk to a trained counselor call 1-800-273-TALK (toll free, 24 hour hotline) go to [facility] [address] [place] [phone]) if experiencing a Mental Health or [place]  - make a plan to eat healthy - keep all lab appointments - take medicine as prescribed  Follow Up Plan: Telephone follow up appointment with care management team member scheduled for: 06-16-2022 at 1 pm       CCM Expected Outcome:  Monitor, Self-Manage and Reduce Symptoms of Heart Failure       Current Barriers:  Knowledge Deficits related to the benefits of weighing daily to monitor for excess fluid and to prevent an exacerbation of HF Care Coordination needs related to education and support from pharm D for medication management and support  in a patient with HF Chronic Disease Management support and education needs related to effective management of HF  Planned Interventions: Basic overview and discussion of pathophysiology of Heart Failure reviewed  Provided education on low sodium diet. The patient states he is mindful of sodium but his sodium levels have been low. Review of heart healthy diet and will send information in the mail  to the patient Reviewed Heart Failure Action Plan in depth and provided written copy Assessed need for readable accurate scales in home. The patient states that he does not weigh himself, he use to but only weighs now when he goes to the doctor.  Discussed importance of daily weight and advised patient to weigh and record daily. The patient states he can tell if he has more water weight on board by how hard it is to get his shoes on. He no longer wears socks. Education on possibly monitoring his weight daily as this is an indicator of exacerbation of heart failure and may be one of the reason his is having more shortness of breath recently. Review of calling the provider for +2 or +3 pounds in one day and + 5 pounds in one week.  Reviewed role of diuretics in prevention of fluid overload and management of heart failure. The patient is currently out of his Torsemide 20 mg. In basket message sent to the provider to see if a refill could be sent in. The patient saw the pcp on 04-02-2022. Has initial outreach with the pharm D on 05-04-2022 at 1 pm. Reminder given today. Discussed the importance of keeping all appointments with provider Advised patient to discuss changes in fluid balance, changes in HF, questions, or concerns with provider Screening for signs and symptoms of depression related to chronic disease state  Assessed social determinant of health barriers EF 50 to 55% on 06-10-2021, education provided on what the EF% was and how it impacts his chronic conditions Discussed getting some zipper compression socks to try to help with the edema in his bilateral legs. He cannot wear socks due to them being too tight. Education on the availability of zipper compression socks by ordering through [employer]. They can be purchased for less that 15.00 and they can be open toe or closed toe. The patient states he tried the traditional before but could not get them on and he does not wear.  Symptom Management: Take  medications as prescribed   Attend all scheduled provider appointments Call provider office for new concerns or questions  call the Suicide and Crisis Lifeline: 988 call the [place] National Suicide Prevention Lifeline: (864)474-3130 or TTY: 217-007-1791 TTY (217) 832-3277) to talk to a trained counselor call 1-800-273-TALK (toll free, 24 hour hotline) go to [facility] [address], [place] [phone]) if experiencing a Mental Health or [place]  call office if I gain more than 2 pounds in one day or 5 pounds in one week use salt in moderation watch for swelling in feet, ankles and legs every day weigh myself daily develop a rescue plan follow rescue plan if symptoms flare-up  Follow Up Plan: Telephone follow up appointment with care management team member scheduled for: 06-16-2022 at 1 pm       CCM Expected Outcome:  Monitor, Self-Manage, and Reduce Symptoms of Hypertension       Current Barriers:  Knowledge Deficits related to the importance of calling the providers when out of medications or changes in HTN or heart health Care Coordination needs related to pharmacy support and education, referral pending in a patient with HTN and other chronic conditions Chronic Disease Management support and education needs related to effective management of HTN BP  Readings from Last 3 Encounters:  04/02/22 132/62  03/10/22 (!) 140/64  02/16/22 120/70     Planned Interventions: Evaluation of current treatment plan related to hypertension self management and patient's adherence to plan as established by provider;   Provided education to patient re: stroke prevention, s/s of heart attack and stroke; Reviewed prescribed diet heart healthy diet. The patient states that his sodium is low and they tell him it is okay to eat salt. Does have issues with swelling and edema in his feet and legs. The patient states he and his son eat a lot of fast  foods. Education on the benefits of eating fresh fruits and vegetables. Will attach heart healthy eating plan to the AVS for the patient to review.  Reviewed medications with patient and discussed importance of compliance;  Discussed plans with patient for ongoing care management follow up and provided patient with direct contact information for care management team; Advised patient, providing education and rationale, to monitor blood pressure daily and record, calling PCP for findings outside established parameters;  Reviewed scheduled/upcoming provider appointments including: 05-18-2022 for labs, knows to call the provider sooner for new needs. Sees cardiologist on a regular basis.  Advised patient to discuss changes in his HTN or  heart health with provider; Provided education on prescribed diet heart healthy diet;  Discussed complications of poorly controlled blood pressure such as heart disease, stroke, circulatory complications, vision complications, kidney impairment, sexual dysfunction;  Screening for signs and symptoms of depression related to chronic disease state;  Assessed social determinant of health barriers;   Symptom Management: Take medications as prescribed   Attend all scheduled provider appointments Call provider office for new concerns or questions  call the Suicide and Crisis Lifeline: 988 call the [place] National Suicide Prevention Lifeline: 913-290-7787 or TTY: 726-211-2276 TTY [phone]) to talk to a trained counselor call 1-800-273-TALK (toll free, 24 hour hotline) go to [facility] [address], [place] [phone]) if experiencing a Mental Health or [place]  check blood pressure weekly learn about high blood pressure call doctor for signs and symptoms of high blood pressure develop an action plan for high blood pressure keep all doctor appointments take medications for blood pressure exactly as  prescribed report new symptoms to your doctor  Follow Up Plan: Telephone follow up appointment with care management team member scheduled for: 06-16-2022 at 1 pm       CCM:  Maintain, Monitor and Self-Manage Symptoms of COPD       Current Barriers:  Knowledge Deficits related to the importance of following the plan of care and taking medications as directed for effective management of COPD Care Coordination needs related to pharmacy needs  in a patient with COPD Chronic Disease Management support and education needs related to effective management of COPD Current every day smoker 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 to 1 PPD  Uses oxygen 2 liters at night, sleeps in a recliner due to inability to lay down because of shortness of breath  Planned Interventions: Provided patient with basic written and verbal COPD education on self care/management/and exacerbation prevention Advised patient to track and manage COPD triggers Provided written and verbal instructions on pursed lip breathing and utilized returned demonstration as teach back Provided instruction about proper use of medications used for management of COPD including inhalers Advised patient to self assesses COPD action plan zone and make appointment with provider if in the yellow zone for 48 hours without improvement Advised patient to engage  in light exercise as tolerated 3-5 days a week to aid in the the management of COPD Provided education about and advised patient to utilize infection prevention strategies to reduce risk of respiratory infection Discussed the importance of adequate rest and management of fatigue with COPD Screening for signs and symptoms of depression related to chronic disease state  Assessed social determinant of health barriers  Symptom Management: Take medications as prescribed   Attend all scheduled provider appointments Call provider office for new concerns or questions  call the Suicide and Crisis Lifeline: 988 call the [place]  National Suicide Prevention Lifeline: (201)237-7423 or TTY: (534)038-2784 TTY 904 454 5065) to talk to a trained counselor call 1-800-273-TALK (toll free, 24 hour hotline) go to [facility] [address], [place] [phone]) if experiencing a [place] or [place]  identify and remove indoor air pollutants limit outdoor activity during cold weather listen for public air quality announcements every day develop a rescue plan eliminate symptom triggers at home follow rescue plan if symptoms flare-up  Follow Up Plan: Telephone follow up appointment with care management team member scheduled for: 06-16-2022 at 1 pm          Plan:Telephone follow up appointment with care management team member scheduled for:  06-16-2022 at 1 pm  Fox Chase, MSN, CCM RN Care Manager  Chronic Care Management Direct Number: [phone]

## 2022-04-16 DIAGNOSIS — J449 Chronic obstructive pulmonary disease, unspecified: Secondary | ICD-10-CM | POA: Diagnosis not present

## 2022-04-18 DIAGNOSIS — I5023 Acute on chronic systolic (congestive) heart failure: Secondary | ICD-10-CM

## 2022-04-18 DIAGNOSIS — Z7951 Long term (current) use of inhaled steroids: Secondary | ICD-10-CM

## 2022-04-18 DIAGNOSIS — I10 Essential (primary) hypertension: Secondary | ICD-10-CM

## 2022-04-18 DIAGNOSIS — J449 Chronic obstructive pulmonary disease, unspecified: Secondary | ICD-10-CM

## 2022-04-18 DIAGNOSIS — I480 Paroxysmal atrial fibrillation: Secondary | ICD-10-CM

## 2022-04-19 DIAGNOSIS — J449 Chronic obstructive pulmonary disease, unspecified: Secondary | ICD-10-CM | POA: Diagnosis not present

## 2022-04-24 ENCOUNTER — Inpatient Hospital Stay (HOSPITAL_COMMUNITY)
Admission: EM | Admit: 2022-04-24 | Discharge: 2022-04-30 | DRG: 291 | Disposition: A | Discharge status: Home / Self Care | Payer: Medicare HMO | Attending: Internal Medicine | Admitting: Internal Medicine

## 2022-04-24 ENCOUNTER — Other Ambulatory Visit: Payer: Self-pay

## 2022-04-24 ENCOUNTER — Emergency Department (HOSPITAL_COMMUNITY): Payer: Medicare HMO

## 2022-04-24 DIAGNOSIS — J441 Chronic obstructive pulmonary disease with (acute) exacerbation: Secondary | ICD-10-CM | POA: Diagnosis present

## 2022-04-24 DIAGNOSIS — I4510 Unspecified right bundle-branch block: Secondary | ICD-10-CM | POA: Diagnosis present

## 2022-04-24 DIAGNOSIS — I5033 Acute on chronic diastolic (congestive) heart failure: Secondary | ICD-10-CM | POA: Diagnosis not present

## 2022-04-24 DIAGNOSIS — Z743 Need for continuous supervision: Secondary | ICD-10-CM | POA: Diagnosis not present

## 2022-04-24 DIAGNOSIS — J9811 Atelectasis: Secondary | ICD-10-CM | POA: Diagnosis present

## 2022-04-24 DIAGNOSIS — E039 Hypothyroidism, unspecified: Secondary | ICD-10-CM | POA: Diagnosis present

## 2022-04-24 DIAGNOSIS — Z888 Allergy status to other drugs, medicaments and biological substances status: Secondary | ICD-10-CM

## 2022-04-24 DIAGNOSIS — R001 Bradycardia, unspecified: Secondary | ICD-10-CM | POA: Diagnosis not present

## 2022-04-24 DIAGNOSIS — K219 Gastro-esophageal reflux disease without esophagitis: Secondary | ICD-10-CM | POA: Diagnosis present

## 2022-04-24 DIAGNOSIS — Z79899 Other long term (current) drug therapy: Secondary | ICD-10-CM

## 2022-04-24 DIAGNOSIS — R062 Wheezing: Secondary | ICD-10-CM | POA: Diagnosis not present

## 2022-04-24 DIAGNOSIS — I110 Hypertensive heart disease with heart failure: Secondary | ICD-10-CM | POA: Diagnosis not present

## 2022-04-24 DIAGNOSIS — D638 Anemia in other chronic diseases classified elsewhere: Secondary | ICD-10-CM | POA: Diagnosis present

## 2022-04-24 DIAGNOSIS — D509 Iron deficiency anemia, unspecified: Secondary | ICD-10-CM | POA: Diagnosis present

## 2022-04-24 DIAGNOSIS — Z7901 Long term (current) use of anticoagulants: Secondary | ICD-10-CM

## 2022-04-24 DIAGNOSIS — E871 Hypo-osmolality and hyponatremia: Secondary | ICD-10-CM

## 2022-04-24 DIAGNOSIS — Z66 Do not resuscitate: Secondary | ICD-10-CM | POA: Diagnosis not present

## 2022-04-24 DIAGNOSIS — D649 Anemia, unspecified: Secondary | ICD-10-CM | POA: Diagnosis present

## 2022-04-24 DIAGNOSIS — I480 Paroxysmal atrial fibrillation: Secondary | ICD-10-CM | POA: Diagnosis present

## 2022-04-24 DIAGNOSIS — E669 Obesity, unspecified: Secondary | ICD-10-CM | POA: Diagnosis present

## 2022-04-24 DIAGNOSIS — I350 Nonrheumatic aortic (valve) stenosis: Secondary | ICD-10-CM | POA: Diagnosis present

## 2022-04-24 DIAGNOSIS — Z803 Family history of malignant neoplasm of breast: Secondary | ICD-10-CM

## 2022-04-24 DIAGNOSIS — R0602 Shortness of breath: Secondary | ICD-10-CM | POA: Diagnosis not present

## 2022-04-24 DIAGNOSIS — Z87442 Personal history of urinary calculi: Secondary | ICD-10-CM

## 2022-04-24 DIAGNOSIS — Z833 Family history of diabetes mellitus: Secondary | ICD-10-CM

## 2022-04-24 DIAGNOSIS — I5032 Chronic diastolic (congestive) heart failure: Secondary | ICD-10-CM | POA: Diagnosis present

## 2022-04-24 DIAGNOSIS — I10 Essential (primary) hypertension: Secondary | ICD-10-CM | POA: Diagnosis present

## 2022-04-24 DIAGNOSIS — F17290 Nicotine dependence, other tobacco product, uncomplicated: Secondary | ICD-10-CM | POA: Diagnosis present

## 2022-04-24 DIAGNOSIS — N400 Enlarged prostate without lower urinary tract symptoms: Secondary | ICD-10-CM | POA: Diagnosis present

## 2022-04-24 DIAGNOSIS — Z6832 Body mass index (BMI) 32.0-32.9, adult: Secondary | ICD-10-CM

## 2022-04-24 DIAGNOSIS — I509 Heart failure, unspecified: Principal | ICD-10-CM

## 2022-04-24 DIAGNOSIS — F17210 Nicotine dependence, cigarettes, uncomplicated: Secondary | ICD-10-CM | POA: Diagnosis present

## 2022-04-24 DIAGNOSIS — Z79890 Hormone replacement therapy (postmenopausal): Secondary | ICD-10-CM

## 2022-04-24 DIAGNOSIS — E785 Hyperlipidemia, unspecified: Secondary | ICD-10-CM | POA: Diagnosis present

## 2022-04-24 DIAGNOSIS — E66811 Obesity, class 1: Secondary | ICD-10-CM

## 2022-04-24 DIAGNOSIS — R0902 Hypoxemia: Secondary | ICD-10-CM | POA: Diagnosis not present

## 2022-04-24 LAB — CBC
HCT: 35.3 % — ABNORMAL LOW (ref 39.0–52.0)
Hemoglobin: 11.3 g/dL — ABNORMAL LOW (ref 13.0–17.0)
MCH: 28.5 pg (ref 26.0–34.0)
MCHC: 32 g/dL (ref 30.0–36.0)
MCV: 88.9 fL (ref 80.0–100.0)
Platelets: 218 10*3/uL (ref 150–400)
RBC: 3.97 MIL/uL — ABNORMAL LOW (ref 4.22–5.81)
RDW: 14.9 % (ref 11.5–15.5)
WBC: 10.7 10*3/uL — ABNORMAL HIGH (ref 4.0–10.5)
nRBC: 0 % (ref 0.0–0.2)

## 2022-04-24 LAB — BASIC METABOLIC PANEL
Anion gap: 14 (ref 5–15)
BUN: 9 mg/dL (ref 8–23)
CO2: 21 mmol/L — ABNORMAL LOW (ref 22–32)
Calcium: 9.1 mg/dL (ref 8.9–10.3)
Chloride: 91 mmol/L — ABNORMAL LOW (ref 98–111)
Creatinine, Ser: 1.01 mg/dL (ref 0.61–1.24)
GFR, Estimated: >60 mL/min (ref >60)
Glucose, Bld: 105 mg/dL — ABNORMAL HIGH (ref 70–99)
Potassium: 4.4 mmol/L (ref 3.5–5.1)
Sodium: 126 mmol/L — ABNORMAL LOW (ref 135–145)

## 2022-04-24 LAB — BRAIN NATRIURETIC PEPTIDE: B Natriuretic Peptide: 409.6 pg/mL — ABNORMAL HIGH (ref 0.0–100.0)

## 2022-04-24 LAB — TROPONIN I (HIGH SENSITIVITY): Troponin I (High Sensitivity): 23 ng/L — ABNORMAL HIGH (ref <18)

## 2022-04-24 LAB — MAGNESIUM: Magnesium: 1.7 mg/dL (ref 1.7–2.4)

## 2022-04-24 MED ORDER — IPRATROPIUM-ALBUTEROL 0.5-2.5 (3) MG/3ML IN SOLN
3.0000 mL | Freq: Once | RESPIRATORY_TRACT | Status: AC
  Administered 2022-04-25: 3 mL via RESPIRATORY_TRACT
  Filled 2022-04-24: qty 3

## 2022-04-24 MED ORDER — METHYLPREDNISOLONE SODIUM SUCC 125 MG IJ SOLR
125.0000 mg | Freq: Once | INTRAMUSCULAR | Status: AC
  Administered 2022-04-24: 125 mg via INTRAVENOUS
  Filled 2022-04-24: qty 2

## 2022-04-24 MED ORDER — IPRATROPIUM-ALBUTEROL 0.5-2.5 (3) MG/3ML IN SOLN
3.0000 mL | Freq: Once | RESPIRATORY_TRACT | Status: AC
  Administered 2022-04-24: 3 mL via RESPIRATORY_TRACT
  Filled 2022-04-24: qty 3

## 2022-04-24 NOTE — ED Triage Notes (Signed)
Pt BIBA to for SOB, coming from home. 92% RA. Hx COPD and Afib. Complete heart block. HR 38.   150/70

## 2022-04-24 NOTE — ED Provider Notes (Signed)
[facility] Emergency Department Provider Note MRN:  [id number]  Arrival date & time: 04/25/22     Chief Complaint   Shortness of Breath   History of Present Illness   Kyle Farley is a 82 y.o. year-old male presents to the ED with chief complaint of shortness of breath.  States that he has been having symptoms for the past week.  He reports associated chest pain and bodyaches.  States that he has chronic lower extremity swelling and edema that is not any worse than normal.  He also reports chronic cough that is not any different than normal.  He is noted to be quite bradycardic on arrival to the high 30s, but is sitting up in bed alert and oriented with normal blood pressure.  He takes Eliquis and amiodarone.  Has history of paroxysmal A-fib.  States that he is due to see his cardiologist (in [place]) on Monday.Marland Kitchen  History provided by patient.   Review of Systems  Pertinent positive and negative review of systems noted in HPI.    Physical Exam   Vitals:   04/24/22 2330 04/24/22 2345  BP: (!) 168/67 (!) 154/66  Pulse: (!) 52 (!) 53  Resp: 16 17  Temp:    SpO2: 100% 100%    CONSTITUTIONAL:  chronically ill-appearing, NAD NEURO:  Alert and oriented x 3, CN 3-12 grossly intact EYES:  eyes equal and reactive ENT/NECK:  Supple, no stridor  CARDIO:  bradycardic, regular rhythm, appears well-perfused  PULM:  No respiratory distress, CTAB GI/GU:  non-distended,  MSK/SPINE:  No gross deformities, bilateral lower extremity edema, moves all extremities  SKIN:  no rash, atraumatic   *Additional and/or pertinent findings included in MDM below  Diagnostic and Interventional Summary    EKG Interpretation  Date/Time:  Saturday April 24 2022 22:46:54 EDT Ventricular Rate:  38 PR Interval:    QRS Duration: 160 QT Interval:  557 QTC Calculation: 443 R Axis:   52 Text Interpretation: Junctional rhythm Right bundle branch block bradycardia new since last  tracing Confirmed by Linwood Dibbles [phone]) on 04/24/2022 10:49:27 PM       Labs Reviewed  BASIC METABOLIC PANEL - Abnormal; Notable for the following components:      Result Value   Sodium 126 (*)    Chloride 91 (*)    CO2 21 (*)    Glucose, Bld 105 (*)    All other components within normal limits  CBC - Abnormal; Notable for the following components:   WBC 10.7 (*)    RBC 3.97 (*)    Hemoglobin 11.3 (*)    HCT 35.3 (*)    All other components within normal limits  BRAIN NATRIURETIC PEPTIDE - Abnormal; Notable for the following components:   B Natriuretic Peptide 409.6 (*)    All other components within normal limits  TROPONIN I (HIGH SENSITIVITY) - Abnormal; Notable for the following components:   Troponin I (High Sensitivity) 23 (*)    All other components within normal limits  MAGNESIUM  TROPONIN I (HIGH SENSITIVITY)    DG Chest Port 1 View  Final Result      Medications  ipratropium-albuterol (DUONEB) 0.5-2.5 (3) MG/3ML nebulizer solution 3 mL (3 mLs Nebulization Given 04/24/22 2322)  methylPREDNISolone sodium succinate (SOLU-MEDROL) 125 mg/2 mL injection 125 mg (125 mg Intravenous Given 04/24/22 2323)  ipratropium-albuterol (DUONEB) 0.5-2.5 (3) MG/3ML nebulizer solution 3 mL (3 mLs Nebulization Given 04/25/22 0016)     Procedures  /  Critical  Care .Critical Care  Performed by: Roxy Horseman, PA-C Authorized by: Roxy Horseman, PA-C   Critical care provider statement:    Critical care time (minutes):  30   Critical care was time spent personally by me on the following activities:  Development of treatment plan with patient or surrogate, discussions with consultants, evaluation of patient's response to treatment, examination of patient, ordering and review of laboratory studies, ordering and review of radiographic studies, ordering and performing treatments and interventions, pulse oximetry, re-evaluation of patient's condition and review of old charts   ED Course and  Medical Decision Making  I have reviewed the triage vital signs, the nursing notes, and pertinent available records from the EMR.  Social Determinants Affecting Complexity of Care: Patient has no clinically significant social determinants affecting this chief complaint..   ED Course:    Medical Decision Making Patient here with shortness of breath, generalized bodyaches, chest pain for the past week.  Noted to be bradycardic in triage to the upper 30s.  EKG appears to be junctional rhythm.  P waves not seen.  Patient placed on 2 L nasal cannula due to O2 being a little low at 90%.  Patient put on pacer pads and will continue to monitor.  Not actively being paced at present.  Maintaining blood pressure.  Sitting up in bed alert and oriented.  Amount and/or Complexity of Data Reviewed Labs: ordered.    Details: BNP elevated Trop 23, thought to be demand, symptoms on going for a week. Radiology: ordered and independent interpretation performed.    Details: No obvious opacity ECG/medicine tests: independent interpretation performed.    Details: Junctional rhythm Repeat now shows p-waves  Risk Prescription drug management. Decision regarding hospitalization.     Consultants: I discussed the case with Hospitalist, Dr. Arlean Hopping, who is appreciated for admitting.   Treatment and Plan: Patient's exam and diagnostic results are concerning for CHF exacerbation.  Feel that patient will need admission to the hospital for further treatment and evaluation.    Final Clinical Impressions(s) / ED Diagnoses     ICD-10-CM   1. Acute on chronic congestive heart failure, unspecified heart failure type  I50.9     2. Hyponatremia  E87.1       ED Discharge Orders     None         Discharge Instructions Discussed with and Provided to Patient:   Discharge Instructions   None      Roxy Horseman, PA-C 04/25/22 0031    Sabas Sous, MD 04/26/22 0700

## 2022-04-25 ENCOUNTER — Inpatient Hospital Stay (HOSPITAL_COMMUNITY): Payer: Medicare HMO

## 2022-04-25 ENCOUNTER — Encounter (HOSPITAL_COMMUNITY): Payer: Self-pay | Admitting: Internal Medicine

## 2022-04-25 DIAGNOSIS — Z87442 Personal history of urinary calculi: Secondary | ICD-10-CM | POA: Diagnosis not present

## 2022-04-25 DIAGNOSIS — E782 Mixed hyperlipidemia: Secondary | ICD-10-CM

## 2022-04-25 DIAGNOSIS — D509 Iron deficiency anemia, unspecified: Secondary | ICD-10-CM | POA: Diagnosis present

## 2022-04-25 DIAGNOSIS — I5031 Acute diastolic (congestive) heart failure: Secondary | ICD-10-CM

## 2022-04-25 DIAGNOSIS — D638 Anemia in other chronic diseases classified elsewhere: Secondary | ICD-10-CM | POA: Diagnosis present

## 2022-04-25 DIAGNOSIS — E871 Hypo-osmolality and hyponatremia: Secondary | ICD-10-CM | POA: Insufficient documentation

## 2022-04-25 DIAGNOSIS — J441 Chronic obstructive pulmonary disease with (acute) exacerbation: Secondary | ICD-10-CM | POA: Diagnosis not present

## 2022-04-25 DIAGNOSIS — E785 Hyperlipidemia, unspecified: Secondary | ICD-10-CM

## 2022-04-25 DIAGNOSIS — I350 Nonrheumatic aortic (valve) stenosis: Secondary | ICD-10-CM | POA: Diagnosis not present

## 2022-04-25 DIAGNOSIS — I5032 Chronic diastolic (congestive) heart failure: Secondary | ICD-10-CM

## 2022-04-25 DIAGNOSIS — E039 Hypothyroidism, unspecified: Secondary | ICD-10-CM | POA: Diagnosis not present

## 2022-04-25 DIAGNOSIS — J9621 Acute and chronic respiratory failure with hypoxia: Secondary | ICD-10-CM

## 2022-04-25 DIAGNOSIS — N400 Enlarged prostate without lower urinary tract symptoms: Secondary | ICD-10-CM

## 2022-04-25 DIAGNOSIS — I10 Essential (primary) hypertension: Secondary | ICD-10-CM | POA: Diagnosis not present

## 2022-04-25 DIAGNOSIS — I480 Paroxysmal atrial fibrillation: Secondary | ICD-10-CM | POA: Diagnosis not present

## 2022-04-25 DIAGNOSIS — J9601 Acute respiratory failure with hypoxia: Secondary | ICD-10-CM | POA: Diagnosis not present

## 2022-04-25 DIAGNOSIS — I4510 Unspecified right bundle-branch block: Secondary | ICD-10-CM | POA: Diagnosis not present

## 2022-04-25 DIAGNOSIS — Z6832 Body mass index (BMI) 32.0-32.9, adult: Secondary | ICD-10-CM | POA: Diagnosis not present

## 2022-04-25 DIAGNOSIS — Z66 Do not resuscitate: Secondary | ICD-10-CM | POA: Diagnosis not present

## 2022-04-25 DIAGNOSIS — J439 Emphysema, unspecified: Secondary | ICD-10-CM | POA: Diagnosis not present

## 2022-04-25 DIAGNOSIS — Z79890 Hormone replacement therapy (postmenopausal): Secondary | ICD-10-CM | POA: Diagnosis not present

## 2022-04-25 DIAGNOSIS — Z720 Tobacco use: Secondary | ICD-10-CM | POA: Diagnosis not present

## 2022-04-25 DIAGNOSIS — I5033 Acute on chronic diastolic (congestive) heart failure: Secondary | ICD-10-CM | POA: Diagnosis not present

## 2022-04-25 DIAGNOSIS — J9811 Atelectasis: Secondary | ICD-10-CM | POA: Diagnosis not present

## 2022-04-25 DIAGNOSIS — I110 Hypertensive heart disease with heart failure: Secondary | ICD-10-CM | POA: Diagnosis not present

## 2022-04-25 DIAGNOSIS — Z803 Family history of malignant neoplasm of breast: Secondary | ICD-10-CM | POA: Diagnosis not present

## 2022-04-25 DIAGNOSIS — D649 Anemia, unspecified: Secondary | ICD-10-CM | POA: Diagnosis present

## 2022-04-25 DIAGNOSIS — K219 Gastro-esophageal reflux disease without esophagitis: Secondary | ICD-10-CM | POA: Diagnosis not present

## 2022-04-25 DIAGNOSIS — Z79899 Other long term (current) drug therapy: Secondary | ICD-10-CM | POA: Diagnosis not present

## 2022-04-25 DIAGNOSIS — F17210 Nicotine dependence, cigarettes, uncomplicated: Secondary | ICD-10-CM | POA: Diagnosis not present

## 2022-04-25 DIAGNOSIS — E669 Obesity, unspecified: Secondary | ICD-10-CM

## 2022-04-25 DIAGNOSIS — F17290 Nicotine dependence, other tobacco product, uncomplicated: Secondary | ICD-10-CM | POA: Diagnosis not present

## 2022-04-25 DIAGNOSIS — Z7901 Long term (current) use of anticoagulants: Secondary | ICD-10-CM | POA: Diagnosis not present

## 2022-04-25 DIAGNOSIS — Z833 Family history of diabetes mellitus: Secondary | ICD-10-CM | POA: Diagnosis not present

## 2022-04-25 DIAGNOSIS — E66811 Obesity, class 1: Secondary | ICD-10-CM

## 2022-04-25 HISTORY — DX: Acute and chronic respiratory failure with hypoxia: J96.21

## 2022-04-25 HISTORY — DX: Obesity, class 1: E66.811

## 2022-04-25 HISTORY — DX: Acute respiratory failure with hypoxia: J96.01

## 2022-04-25 HISTORY — DX: Chronic diastolic (congestive) heart failure: I50.32

## 2022-04-25 HISTORY — DX: Anemia in other chronic diseases classified elsewhere: D63.8

## 2022-04-25 HISTORY — DX: Hypo-osmolality and hyponatremia: E87.1

## 2022-04-25 HISTORY — DX: Acute on chronic diastolic (congestive) heart failure: I50.33

## 2022-04-25 LAB — ECHOCARDIOGRAM COMPLETE
AR max vel: 1.89 cm2
AV Area VTI: 1.53 cm2
AV Area mean vel: 1.63 cm2
AV Mean grad: 17 mmHg
AV Peak grad: 31.8 mmHg
Ao pk vel: 2.82 m/s
Area-P 1/2: 3.21 cm2
Height: 69 in
S' Lateral: 2.6 cm
Weight: 3552.05 oz

## 2022-04-25 LAB — CBC WITH DIFFERENTIAL/PLATELET
Abs Immature Granulocytes: 0.14 10*3/uL — ABNORMAL HIGH (ref 0.00–0.07)
Basophils Absolute: 0 10*3/uL (ref 0.0–0.1)
Basophils Relative: 0 %
Eosinophils Absolute: 0 10*3/uL (ref 0.0–0.5)
Eosinophils Relative: 0 %
HCT: 39.3 % (ref 39.0–52.0)
Hemoglobin: 12.6 g/dL — ABNORMAL LOW (ref 13.0–17.0)
Immature Granulocytes: 1 %
Lymphocytes Relative: 3 %
Lymphs Abs: 0.4 10*3/uL — ABNORMAL LOW (ref 0.7–4.0)
MCH: 28.3 pg (ref 26.0–34.0)
MCHC: 32.1 g/dL (ref 30.0–36.0)
MCV: 88.1 fL (ref 80.0–100.0)
Monocytes Absolute: 0.2 10*3/uL (ref 0.1–1.0)
Monocytes Relative: 1 %
Neutro Abs: 15.3 10*3/uL — ABNORMAL HIGH (ref 1.7–7.7)
Neutrophils Relative %: 95 %
Platelets: 261 10*3/uL (ref 150–400)
RBC: 4.46 MIL/uL (ref 4.22–5.81)
RDW: 15 % (ref 11.5–15.5)
WBC: 16.1 10*3/uL — ABNORMAL HIGH (ref 4.0–10.5)
nRBC: 0 % (ref 0.0–0.2)

## 2022-04-25 LAB — BLOOD GAS, VENOUS
Acid-Base Excess: 4.5 mmol/L — ABNORMAL HIGH (ref 0.0–2.0)
Bicarbonate: 29.8 mmol/L — ABNORMAL HIGH (ref 20.0–28.0)
Drawn by: 42628
O2 Saturation: 80.3 %
Patient temperature: 36.5
pCO2, Ven: 45 mmHg (ref 44–60)
pH, Ven: 7.43 (ref 7.25–7.43)
pO2, Ven: 44 mmHg (ref 32–45)

## 2022-04-25 LAB — COMPREHENSIVE METABOLIC PANEL
ALT: 27 U/L (ref 0–44)
AST: 27 U/L (ref 15–41)
Albumin: 3.8 g/dL (ref 3.5–5.0)
Alkaline Phosphatase: 85 U/L (ref 38–126)
Anion gap: 12 (ref 5–15)
BUN: 11 mg/dL (ref 8–23)
CO2: 27 mmol/L (ref 22–32)
Calcium: 9.2 mg/dL (ref 8.9–10.3)
Chloride: 91 mmol/L — ABNORMAL LOW (ref 98–111)
Creatinine, Ser: 0.99 mg/dL (ref 0.61–1.24)
GFR, Estimated: >60 mL/min (ref >60)
Glucose, Bld: 146 mg/dL — ABNORMAL HIGH (ref 70–99)
Potassium: 3.8 mmol/L (ref 3.5–5.1)
Sodium: 130 mmol/L — ABNORMAL LOW (ref 135–145)
Total Bilirubin: 0.6 mg/dL (ref 0.3–1.2)
Total Protein: 7.6 g/dL (ref 6.5–8.1)

## 2022-04-25 LAB — MAGNESIUM: Magnesium: 2.1 mg/dL (ref 1.7–2.4)

## 2022-04-25 LAB — TROPONIN I (HIGH SENSITIVITY): Troponin I (High Sensitivity): 22 ng/L — ABNORMAL HIGH (ref <18)

## 2022-04-25 LAB — PHOSPHORUS: Phosphorus: 4.3 mg/dL (ref 2.5–4.6)

## 2022-04-25 LAB — PROCALCITONIN: Procalcitonin: <0.1 ng/mL

## 2022-04-25 MED ORDER — LISINOPRIL 20 MG PO TABS
20.0000 mg | ORAL_TABLET | Freq: Every day | ORAL | Status: DC
  Administered 2022-04-25: 20 mg via ORAL
  Filled 2022-04-25: qty 1

## 2022-04-25 MED ORDER — FUROSEMIDE 10 MG/ML IJ SOLN
40.0000 mg | Freq: Once | INTRAMUSCULAR | Status: AC
  Administered 2022-04-25: 40 mg via INTRAVENOUS
  Filled 2022-04-25: qty 4

## 2022-04-25 MED ORDER — IPRATROPIUM-ALBUTEROL 0.5-2.5 (3) MG/3ML IN SOLN
3.0000 mL | RESPIRATORY_TRACT | Status: DC | PRN
  Administered 2022-04-29: 3 mL via RESPIRATORY_TRACT
  Filled 2022-04-25: qty 3

## 2022-04-25 MED ORDER — FLUTICASONE FUROATE-VILANTEROL 200-25 MCG/ACT IN AEPB
1.0000 | INHALATION_SPRAY | Freq: Every day | RESPIRATORY_TRACT | Status: DC
  Administered 2022-04-26 – 2022-04-30 (×5): 1 via RESPIRATORY_TRACT
  Filled 2022-04-25: qty 28

## 2022-04-25 MED ORDER — TAMSULOSIN HCL 0.4 MG PO CAPS
0.4000 mg | ORAL_CAPSULE | Freq: Every day | ORAL | Status: DC
  Administered 2022-04-25 – 2022-04-30 (×6): 0.4 mg via ORAL
  Filled 2022-04-25 (×6): qty 1

## 2022-04-25 MED ORDER — POTASSIUM CHLORIDE CRYS ER 20 MEQ PO TBCR
20.0000 meq | EXTENDED_RELEASE_TABLET | Freq: Every day | ORAL | Status: DC
  Administered 2022-04-25: 20 meq via ORAL
  Filled 2022-04-25: qty 1

## 2022-04-25 MED ORDER — ACETAMINOPHEN 650 MG RE SUPP: 650.0000 mg | Freq: Four times a day (QID) | RECTAL | Status: DC | PRN

## 2022-04-25 MED ORDER — FUROSEMIDE 10 MG/ML IJ SOLN
40.0000 mg | Freq: Two times a day (BID) | INTRAMUSCULAR | Status: DC
  Administered 2022-04-25: 40 mg via INTRAVENOUS
  Filled 2022-04-25: qty 4

## 2022-04-25 MED ORDER — ALUM & MAG HYDROXIDE-SIMETH 200-200-20 MG/5ML PO SUSP
30.0000 mL | ORAL | Status: DC | PRN
  Administered 2022-04-25 (×2): 30 mL via ORAL
  Filled 2022-04-25 (×2): qty 30

## 2022-04-25 MED ORDER — NICOTINE 21 MG/24HR TD PT24: 21.0000 mg | MEDICATED_PATCH | Freq: Every day | TRANSDERMAL | Status: DC | PRN

## 2022-04-25 MED ORDER — METOPROLOL SUCCINATE ER 25 MG PO TB24
12.5000 mg | ORAL_TABLET | Freq: Every day | ORAL | Status: DC
  Administered 2022-04-28 – 2022-04-30 (×3): 12.5 mg via ORAL
  Filled 2022-04-25 (×5): qty 1

## 2022-04-25 MED ORDER — BENZONATATE 100 MG PO CAPS
200.0000 mg | ORAL_CAPSULE | Freq: Three times a day (TID) | ORAL | Status: DC
  Administered 2022-04-25 – 2022-04-30 (×15): 200 mg via ORAL
  Filled 2022-04-25 (×15): qty 2

## 2022-04-25 MED ORDER — EMPAGLIFLOZIN 10 MG PO TABS
10.0000 mg | ORAL_TABLET | Freq: Every day | ORAL | Status: DC
  Administered 2022-04-25 – 2022-04-30 (×6): 10 mg via ORAL
  Filled 2022-04-25 (×6): qty 1

## 2022-04-25 MED ORDER — UMECLIDINIUM BROMIDE 62.5 MCG/ACT IN AEPB
1.0000 | INHALATION_SPRAY | Freq: Every day | RESPIRATORY_TRACT | Status: DC
  Filled 2022-04-25: qty 7

## 2022-04-25 MED ORDER — ALBUTEROL SULFATE (2.5 MG/3ML) 0.083% IN NEBU
2.5000 mg | INHALATION_SOLUTION | RESPIRATORY_TRACT | Status: DC | PRN
  Administered 2022-04-25: 2.5 mg via RESPIRATORY_TRACT
  Filled 2022-04-25: qty 3

## 2022-04-25 MED ORDER — METOPROLOL SUCCINATE ER 25 MG PO TB24
25.0000 mg | ORAL_TABLET | Freq: Every day | ORAL | Status: DC
  Administered 2022-04-25: 25 mg via ORAL
  Filled 2022-04-25: qty 1

## 2022-04-25 MED ORDER — MONTELUKAST SODIUM 10 MG PO TABS
10.0000 mg | ORAL_TABLET | Freq: Every day | ORAL | Status: DC
  Administered 2022-04-25 – 2022-04-29 (×5): 10 mg via ORAL
  Filled 2022-04-25 (×5): qty 1

## 2022-04-25 MED ORDER — ONDANSETRON HCL 4 MG/2ML IJ SOLN: 4.0000 mg | Freq: Four times a day (QID) | INTRAMUSCULAR | Status: DC | PRN

## 2022-04-25 MED ORDER — IPRATROPIUM-ALBUTEROL 0.5-2.5 (3) MG/3ML IN SOLN
3.0000 mL | Freq: Four times a day (QID) | RESPIRATORY_TRACT | Status: DC
  Administered 2022-04-25 – 2022-04-28 (×11): 3 mL via RESPIRATORY_TRACT
  Filled 2022-04-25 (×11): qty 3

## 2022-04-25 MED ORDER — MAGNESIUM SULFATE 2 GM/50ML IV SOLN
2.0000 g | Freq: Once | INTRAVENOUS | Status: AC
  Administered 2022-04-25: 2 g via INTRAVENOUS
  Filled 2022-04-25: qty 50

## 2022-04-25 MED ORDER — TRAZODONE HCL 50 MG PO TABS
50.0000 mg | ORAL_TABLET | Freq: Every day | ORAL | Status: DC
  Administered 2022-04-25 – 2022-04-29 (×5): 50 mg via ORAL
  Filled 2022-04-25 (×5): qty 1

## 2022-04-25 MED ORDER — GUAIFENESIN-DM 100-10 MG/5ML PO SYRP
5.0000 mL | ORAL_SOLUTION | ORAL | Status: DC | PRN
  Administered 2022-04-25 (×2): 5 mL via ORAL
  Filled 2022-04-25 (×2): qty 5

## 2022-04-25 MED ORDER — ATORVASTATIN CALCIUM 40 MG PO TABS
40.0000 mg | ORAL_TABLET | Freq: Every day | ORAL | Status: DC
  Administered 2022-04-25 – 2022-04-30 (×6): 40 mg via ORAL
  Filled 2022-04-25 (×6): qty 1

## 2022-04-25 MED ORDER — LEVOTHYROXINE SODIUM 75 MCG PO TABS
75.0000 ug | ORAL_TABLET | Freq: Every day | ORAL | Status: DC
  Administered 2022-04-25 – 2022-04-30 (×6): 75 ug via ORAL
  Filled 2022-04-25 (×6): qty 1

## 2022-04-25 MED ORDER — PERFLUTREN LIPID MICROSPHERE
1.0000 mL | INTRAVENOUS | Status: AC | PRN
  Administered 2022-04-25: 7 mL via INTRAVENOUS

## 2022-04-25 MED ORDER — APIXABAN 5 MG PO TABS
5.0000 mg | ORAL_TABLET | Freq: Two times a day (BID) | ORAL | Status: DC
  Administered 2022-04-25 – 2022-04-30 (×11): 5 mg via ORAL
  Filled 2022-04-25 (×11): qty 1

## 2022-04-25 MED ORDER — LOSARTAN POTASSIUM 25 MG PO TABS
25.0000 mg | ORAL_TABLET | Freq: Every day | ORAL | Status: DC
  Administered 2022-04-26 – 2022-04-28 (×3): 25 mg via ORAL
  Filled 2022-04-25 (×3): qty 1

## 2022-04-25 MED ORDER — ACETAMINOPHEN 325 MG PO TABS: 650.0000 mg | ORAL_TABLET | Freq: Four times a day (QID) | ORAL | Status: DC | PRN

## 2022-04-25 MED ORDER — MELATONIN 3 MG PO TABS: 3.0000 mg | ORAL_TABLET | Freq: Every evening | ORAL | Status: DC | PRN

## 2022-04-25 MED ORDER — METHYLPREDNISOLONE SODIUM SUCC 40 MG IJ SOLR
40.0000 mg | Freq: Every day | INTRAMUSCULAR | Status: DC
  Administered 2022-04-25 – 2022-04-28 (×4): 40 mg via INTRAVENOUS
  Filled 2022-04-25 (×5): qty 1

## 2022-04-25 MED ORDER — SPIRONOLACTONE 12.5 MG HALF TABLET
12.5000 mg | ORAL_TABLET | Freq: Every day | ORAL | Status: DC
  Administered 2022-04-25 – 2022-04-30 (×6): 12.5 mg via ORAL
  Filled 2022-04-25 (×6): qty 1

## 2022-04-25 MED ORDER — GUAIFENESIN-DM 100-10 MG/5ML PO SYRP: 10.0000 mL | ORAL_SOLUTION | ORAL | Status: DC | PRN

## 2022-04-25 MED ORDER — AMIODARONE HCL 200 MG PO TABS
200.0000 mg | ORAL_TABLET | Freq: Two times a day (BID) | ORAL | Status: DC
  Administered 2022-04-25 – 2022-04-30 (×10): 200 mg via ORAL
  Filled 2022-04-25 (×11): qty 1

## 2022-04-25 MED ORDER — AMLODIPINE BESYLATE 5 MG PO TABS: 5.0000 mg | ORAL_TABLET | Freq: Every day | ORAL | Status: DC

## 2022-04-25 NOTE — ED Notes (Signed)
ED TO INPATIENT HANDOFF REPORT  ED Nurse Name and Phone #: Kashonda Sarkisyan  S Name/Age/Gender Thresa Rossarrell L Hegna 82 y.o. male Room/Bed: 033C/033C  Code Status   Code Status: Full Code  Home/SNF/Other Home Patient oriented to: self, place, time, and situation Is this baseline? Yes   Triage Complete: Triage complete  Chief Complaint Acute on chronic diastolic heart failure [I50.33]  Triage Note Pt BIBA to for SOB, coming from home. 92% RA. Hx COPD and Afib. Complete heart block per EMS. HR 38.   150/70    Allergies Allergies  Allergen Reactions   Diltiazem     Other reaction(s): Unknown    Level of Care/Admitting Diagnosis ED Disposition     ED Disposition  Admit   Condition  --   Comment  [facility] [[id number]  Level of Care: Telemetry Cardiac [103]  [facility] or [facility] if equivalent level of care is available:: No  Covid Evaluation: Asymptomatic - no recent exposure (last 10 days) testing not required  Diagnosis: Acute on chronic diastolic heart failure [428.33.ICD-9-CM]  Admitting Physician: Angie FavaHOWERTER, JUSTIN B [[id number]]  Attending Physician: Angie FavaHOWERTER, JUSTIN B [[id number]]  Certification:: I certify this patient will need inpatient services for at least 2 midnights  Estimated Length of Stay: 2          B Medical/Surgery History Past Medical History:  Diagnosis Date   Alcohol use 09/12/2020   Arthritis    CHF (congestive heart failure)    Cigarette smoker 10/21/2014   COPD (chronic obstructive pulmonary disease)    COPD exacerbation 01/24/2017   Dyspnea    Dysrhythmia    atrial fibrillation   Essential hypertension 10/21/2014   Femur fracture, right 09/12/2020   GERD (gastroesophageal reflux disease)    History of kidney stones    Hyperlipidemia 01/24/2017   Hypertension    Kidney stones 01/24/2017   Paroxysmal atrial fibrillation 10/21/2014   Pressure injury of skin 09/16/2020   Past Surgical History:   Procedure Laterality Date   LEFT HEART CATH AND CORONARY ANGIOGRAPHY N/A 09/15/2020   Procedure: LEFT HEART CATH AND CORONARY ANGIOGRAPHY;  Surgeon: Tonny Bollmanooper, Michael, MD;  [facility];  Service: Cardiovascular;  Laterality: N/A;   LEG SURGERY Right    steal pin placed in right leg 35 years ago   ORIF FEMUR FRACTURE Right 09/15/2020   Procedure: OPEN REDUCTION INTERNAL FIXATION FEMORAL SHAFT FRACTURE;  Surgeon: Roby LoftsHaddix, Kevin P, MD;  Location: [facility] OR;  Service: Orthopedics;  Laterality: Right;     A IV Location/Drains/Wounds Patient Lines/Drains/Airways Status     Active Line/Drains/Airways     Name Placement date Placement time Site Days   Peripheral IV 04/24/22 18 G Right Antecubital 04/24/22  2251  Antecubital  1   Peripheral IV 04/24/22 18 G Left Antecubital 04/24/22  2252  Antecubital  1   Incision (Closed) 09/15/20 Leg Right 09/15/20  1435  -- 587   Pressure Injury 09/16/20 Buttocks Left;Medial Stage 2 -  Partial thickness loss of dermis presenting as a shallow open injury with a red, pink wound bed without slough. 09/16/20  1035  -- 586            Intake/Output Last 24 hours No intake or output data in the 24 hours ending 04/25/22 0057  Labs/Imaging Results for orders placed or performed during the hospital encounter of 04/24/22 (from the past 48 hour(s))  Basic metabolic panel     Status: Abnormal  Collection Time: 04/24/22 10:56 PM  Result Value Ref Range   Sodium 126 (L) 135 - 145 mmol/L   Potassium 4.4 3.5 - 5.1 mmol/L   Chloride 91 (L) 98 - 111 mmol/L   CO2 21 (L) 22 - 32 mmol/L   Glucose, Bld 105 (H) 70 - 99 mg/dL    Comment: Glucose reference range applies only to samples taken after fasting for at least 8 hours.   BUN 9 8 - 23 mg/dL   Creatinine, Ser 0.34 0.61 - 1.24 mg/dL   Calcium 9.1 8.9 - 03.5 mg/dL   GFR, Estimated >24 >81 mL/min    Comment: (NOTE) Calculated using the CKD-EPI Creatinine Equation (2021)    Anion gap 14 5 - 15     Comment: Performed at [facility], [address], [place], [place] [address]  CBC     Status: Abnormal   Collection Time: 04/24/22 10:56 PM  Result Value Ref Range   WBC 10.7 (H) 4.0 - 10.5 K/uL   RBC 3.97 (L) 4.22 - 5.81 MIL/uL   Hemoglobin 11.3 (L) 13.0 - 17.0 g/dL   HCT 31.1 (L) 21.6 - 24.4 %   MCV 88.9 80.0 - 100.0 fL   MCH 28.5 26.0 - 34.0 pg   MCHC 32.0 30.0 - 36.0 g/dL   RDW 69.5 07.2 - 25.7 %   Platelets 218 150 - 400 K/uL   nRBC 0.0 0.0 - 0.2 %    Comment: Performed at [facility], [address], [place], [place] [address]  Troponin I (High Sensitivity)     Status: Abnormal   Collection Time: 04/24/22 10:56 PM  Result Value Ref Range   Troponin I (High Sensitivity) 23 (H) <18 ng/L    Comment: (NOTE) Elevated high sensitivity troponin I (hsTnI) values and significant  changes across serial measurements may suggest ACS but many other  chronic and acute conditions are known to elevate hsTnI results.  Refer to the "Links" section for chest pain algorithms and additional  guidance. Performed at [facility], [address], [place], [place] [address]   Brain natriuretic peptide     Status: Abnormal   Collection Time: 04/24/22 10:56 PM  Result Value Ref Range   B Natriuretic Peptide 409.6 (H) 0.0 - 100.0 pg/mL    Comment: Performed at [facility], [address], [place], [place] [address]  Magnesium     Status: None   Collection Time: 04/24/22 10:56 PM  Result Value Ref Range   Magnesium 1.7 1.7 - 2.4 mg/dL    Comment: Performed at [facility], [address], [place], [place] [address]   DG Chest Port 1 View  Result Date: 04/24/2022 CLINICAL DATA:  Shortness of breath EXAM: PORTABLE CHEST 1 VIEW COMPARISON:  03/10/2022 FINDINGS: Cardiac shadow is enlarged but accentuated by the portable technique. Aortic calcifications are seen. Mild central vascular congestion is noted without edema. No focal infiltrate is noted. No bony abnormality is  seen. IMPRESSION: Mild central vascular congestion without focal edema. Electronically Signed   By: Alcide Clever M.D.   On: 04/24/2022 23:29    Pending Labs Unresulted Labs (From admission, onward)     Start     Ordered   04/25/22 0500  CBC with Differential/Platelet  Tomorrow morning,   R        04/25/22 0038   04/25/22 0500  Comprehensive metabolic panel  Tomorrow morning,   R  04/25/22 0038   04/25/22 0500  Magnesium  Tomorrow morning,   R        04/25/22 0038            Vitals/Pain Today's Vitals   04/24/22 2257 04/24/22 2315 04/24/22 2330 04/24/22 2345  BP:   (!) 168/67 (!) 154/66  Pulse:  (!) 53 (!) 52 (!) 53  Resp:  (!) 23 16 17   Temp: 97.6 F (36.4 C)     TempSrc: Oral     SpO2:  99% 100% 100%  Weight:      Height:      PainSc:        Isolation Precautions No active isolations  Medications Medications  acetaminophen (TYLENOL) tablet 650 mg (has no administration in time range)    Or  acetaminophen (TYLENOL) suppository 650 mg (has no administration in time range)  melatonin tablet 3 mg (has no administration in time range)  ondansetron (ZOFRAN) injection 4 mg (has no administration in time range)  furosemide (LASIX) injection 40 mg (has no administration in time range)  furosemide (LASIX) injection 40 mg (has no administration in time range)  ipratropium-albuterol (DUONEB) 0.5-2.5 (3) MG/3ML nebulizer solution 3 mL (3 mLs Nebulization Given 04/24/22 2322)  methylPREDNISolone sodium succinate (SOLU-MEDROL) 125 mg/2 mL injection 125 mg (125 mg Intravenous Given 04/24/22 2323)  ipratropium-albuterol (DUONEB) 0.5-2.5 (3) MG/3ML nebulizer solution 3 mL (3 mLs Nebulization Given 04/25/22 0016)    Mobility walks with device     Focused Assessments     R Recommendations: See Admitting Provider Note  Report given to:   Additional Notes:

## 2022-04-25 NOTE — Progress Notes (Signed)
Echocardiogram 2D Echocardiogram has been performed.  Warren Lacy Jahni Nazar RDCS 04/25/2022, 3:25 PM

## 2022-04-25 NOTE — Assessment & Plan Note (Addendum)
Patient with dyspnea, wheezing, increase sputum production and wheezing.  Will continue systemic corticosteroids.  Add bronchodilator therapy, inhaled corticosteroids and airway clearing techniques.  Antitussive agents.   Smoking cessation counseling.

## 2022-04-25 NOTE — Assessment & Plan Note (Signed)
Continue levothyroxine 

## 2022-04-25 NOTE — Assessment & Plan Note (Addendum)
Echocardiogram with preserved LV systolic function with EF 60 to 65%, no wall motion abnormalities, mild LVH, RV with moderate dilatation, RV systolic function low normal, LA and RA with moderate dilatation, mild to moderate aortic stenosis.   Documented urine output is 3,700  cc Systolic blood pressure is 115 to 117 mmHg.   Plan to continue with metoprolol, losartan, SGLT 2 inh and spironolactone. Hold on furosemide for today.

## 2022-04-25 NOTE — Assessment & Plan Note (Signed)
Hgb stable.  Continue to follow up cell count.

## 2022-04-25 NOTE — Hospital Course (Addendum)
Kyle Farley was admitted to the hospital with the working diagnosis of heart failure exacerbation.   82 yo male with the past medical history of heart failure, paroxysmal atrial fibrillation, COPD, and dyslipidemia who presented with dyspnea. Reported one week of progressive dyspnea, associated with orthopnea, and worsening lower extremity edema. Positive wheezing and non productive cough. On his initial physical examination his blood pressure was 168/67, HR 53, RR 23 and 02 saturation 99%, lungs with no wheezing or rales, heart with S2 and S2 present and regular with no gallops, or murmurs, abdomen with no distention and positive lower extremity edema.   Na 126, K 4,4 Cl 91, bicarbonate 21, glucose 105, bun 9  cr 1,0 Mag 1,7  BNP 409 High sensitive troponin 23 and 22 Wbc 10,7 hgb 11,3 plt 218   Chest radiograph with cardiomegaly and bilateral hilar vascular congestion, positive right base atelectasis and hyperinflation.   #1 EKG 38 bpm, normal axis, right bundle branch block, junctional rhythm with no significant ST segment or T wave changes.  #2 EKG 54 bpm, normal axis, right bundle branch block, atrial rhythm with no significant ST segment or T wave changes.   Patient was placed on furosemide for diuresis.   04/08 patient with improvement in volume status, continue to have dyspnea due to COPD exacerbation.  04/09 clinically improving, possible dc home in 48 hrs.

## 2022-04-25 NOTE — H&P (Signed)
History and Physical      Kyle Farley ZOX:[id number] DOB: 02/01/1940 DOA: 04/24/2022  PCP: Kyle Morning, NP (Inactive)  Patient coming from: home   I have personally briefly reviewed patient's old medical records in [facility]  Chief Complaint: Shortness of breath  HPI: Kyle Farley is a 82 y.o. male with medical history significant for chronic diastolic heart failure, paroxysmal atrial fibrillation chronically anticoagulated on Eliquis, COPD, essential hypertension, hyperlipidemia, acquired hypothyroidism, chronic hyponatremia, who is admitted to [facility] on 04/24/2022 with acute on chronic diastolic heart failure after presenting from home to [facility] ED complaining of shortness of breath.   The patient reports 1 week of progressive shortness of breath associated with orthopnea, worsening of edema in the bilateral lower extremities, wheezing.  Denies any associated chest pain, palpitations, diaphoresis, nausea, vomiting, dizziness, presyncope, or syncope.  Not associated any subjective fever, chills, rigors, generalized myalgias.  Reports nonproductive cough over that timeframe, in the absence of an hemoptysis.  No recent new lower extremity erythema or new calf tenderness.  No recent trauma.  Medical history notable for chronic diastolic heart failure, with most recent echocardiogram, which was performed in the [place] system, in May 2023, which is notable for LVEF 50 to 55%, indeterminate diastolic function, severely dilated left atrium, mildly dilated right atrium, trace aortic regurgitation, mild aortic stenosis, with aortic valve area calculated via continuity equation to be 1.13 cm to, mild mitral regurgitation.  He is not on any scheduled diuretic medications as an outpatient, noting that his torsemide is ordered on a as needed basis at home.  He also has a documented history of paroxysmal atrial fibrillation, vertebrals good compliance via his chronic anticoagulation  on Eliquis.  Denies any known baseline supplemental oxygen requirements.  Medical history also notable for chronic hyponatremia, cyst with baseline serum sodium range of 1 24-1 32 dating back to August 2022.     ED Course:  Vital signs in the ED were notable for the following: Afebrile; heart rates in the 50s; systolic blood pressures in the 120s to 160s; respiratory rate 16-23, initial oxygen saturation 89% on room air, [place] improving into the range of 98 to 100% on 2 L nasal cannula.  Labs were notable for the following: BMP notable for sodium 126, doses of prior value 131 on 04/02/2022, potassium 4.4, bicarbonate 21, anion gap 14, creatinine 1.01 compared to measures of prior value of 1.06 on 04/02/2022.  BNP 409 compared to most recent value of 230 in August 2022.  High-sensitivity troponin I 23, no prior high-sensitivity troponin I available for reported comparison.  CBC notable for open cell count 10,700, hemoglobin 11.3, compared to most recent prior value of 11.9 on 3 15-24.  EKG in ED demonstrated the following: Perform will read, EKG read as ectopic atrial rhythm with regular manage block, heart rate 54, no evidence of T wave or ST changes, including no evidence of ST elevation.  Imaging and additional notable ED work-up: Chest x-ray, compared to most recent prior chest x-ray from 03/10/2022, performed radiology read, shows interval development of central vascular congestion, without overt evidence of infiltrate, pleural effusion, or pneumothorax.  While in the ED, the following were administered: Duo nebulizer treatments x 2, Solu-Medrol 125 mg IV x 1.  Subsequently, the patient was admitted for further evaluation management of suspected acute on chronic diastolic heart failure complicated by acute hypoxic respiratory distress.      Review of Systems: As per HPI otherwise 10  point review of systems negative.   Past Medical History:  Diagnosis Date   Alcohol use 09/12/2020    Arthritis    CHF (congestive heart failure)    Cigarette smoker 10/21/2014   COPD (chronic obstructive pulmonary disease)    COPD exacerbation 01/24/2017   Dyspnea    Dysrhythmia    atrial fibrillation   Essential hypertension 10/21/2014   Femur fracture, right 09/12/2020   GERD (gastroesophageal reflux disease)    History of kidney stones    Hyperlipidemia 01/24/2017   Hypertension    Kidney stones 01/24/2017   Paroxysmal atrial fibrillation 10/21/2014   Pressure injury of skin 09/16/2020    Past Surgical History:  Procedure Laterality Date   LEFT HEART CATH AND CORONARY ANGIOGRAPHY N/A 09/15/2020   Procedure: LEFT HEART CATH AND CORONARY ANGIOGRAPHY;  Surgeon: Tonny Bollman, MD;  Location: [facility] INVASIVE CV LAB;  Service: Cardiovascular;  Laterality: N/A;   LEG SURGERY Right    steal pin placed in right leg 35 years ago   ORIF FEMUR FRACTURE Right 09/15/2020   Procedure: OPEN REDUCTION INTERNAL FIXATION FEMORAL SHAFT FRACTURE;  Surgeon: Roby Lofts, MD;  Location: [facility] OR;  Service: Orthopedics;  Laterality: Right;    Social History:  reports that he has been smoking cigarettes and cigars. He has a 62.00 pack-year smoking history. He has never used smokeless tobacco. He reports current alcohol use of about 5.0 standard drinks of alcohol per week. He reports that he does not use drugs.   Allergies  Allergen Reactions   Diltiazem     Other reaction(s): Unknown    Family History  Problem Relation Age of Onset   Cancer Mother    Diabetes Mother    Breast cancer Sister     Family history reviewed and not pertinent    Prior to Admission medications   Medication Sig Start Date End Date Taking? Authorizing Provider  albuterol (VENTOLIN HFA) 108 (90 Base) MCG/ACT inhaler Inhale 2 puffs into the lungs every 4 (four) hours as needed for wheezing or shortness of breath. 03/05/22   CoxFritzi Mandes, MD  amiodarone (PACERONE) 200 MG tablet Take 200 mg by mouth 2 (two) times daily. 01/28/22    [provider]  amLODipine (NORVASC) 5 MG tablet Take 1 tablet (5 mg total) by mouth daily. 04/15/22   [name], [name], MD  apixaban (ELIQUIS) 5 MG TABS tablet Take 1 tablet (5 mg total) by mouth 2 (two) times daily. 03/12/19   Baldo Daub, MD  atorvastatin (LIPITOR) 40 MG tablet Take 1 tablet (40 mg total) by mouth daily. 12/22/21   Gaston Islam., NP  Fluticasone-Umeclidin-Vilant (TRELEGY ELLIPTA) 200-62.5-25 MCG/ACT AEPB Inhale 2 puffs into the lungs daily. 04/02/22   Lurline Del, FNP  ipratropium-albuterol (DUONEB) 0.5-2.5 (3) MG/3ML SOLN Inhale 3 mLs into the lungs every 6 (six) hours as needed. 04/06/22   CoxFritzi Mandes, MD  levothyroxine (SYNTHROID) 75 MCG tablet Take 1 tablet (75 mcg total) by mouth daily. 04/04/22   Lurline Del, FNP  lisinopril (ZESTRIL) 40 MG tablet Take 20 mg by mouth daily at 12 noon. 10/09/21   [provider]  Magnesium 400 MG CAPS Take 400 mg by mouth daily.    [provider]  metoprolol succinate (TOPROL-XL) 50 MG 24 hr tablet Take 1 tablet (50 mg total) by mouth daily. Take with or immediately following a meal. 01/04/22   Camnitz, Andree Coss, MD  montelukast (SINGULAIR) 10 MG tablet Take 1 tablet (10 mg total)  by mouth daily at 12 noon. 04/15/22   [name], [name], MD  potassium chloride SA (KLOR-CON) 20 MEQ tablet Take 20 mEq by mouth daily. 10/26/20   [provider]  tamsulosin (FLOMAX) 0.4 MG CAPS capsule Take 0.4 mg by mouth daily. 04/17/20   [provider]  thiamine (VITAMIN B1) 100 MG tablet Take 100 mg by mouth daily. 12/05/21   [provider]  torsemide (DEMADEX) 20 MG tablet Take 1 tablet (20 mg total) by mouth 2 (two) times daily as needed. 04/15/22   Blane Ohara, MD     Objective    Physical Exam: Vitals:   04/24/22 2257 04/24/22 2315 04/24/22 2330 04/24/22 2345  BP:   (!) 168/67 (!) 154/66  Pulse:  (!) 53 (!) 52 (!) 53  Resp:  (!) 23 16 17   Temp: 97.6 F (36.4 C)     TempSrc: Oral     SpO2:   99% 100% 100%  Weight:      Height:        General: appears to be stated age; alert, oriented; mildly increased work of breathing noted. Skin: warm, dry, no rash Head:  AT/[place] Mouth:  Oral mucosa membranes appear moist, normal dentition Neck: supple; trachea midline Heart:  RRR; did not appreciate any M/R/G Lungs: CTAB, did not appreciate any wheezes, rales, or rhonchi Abdomen: + BS; soft, ND, NT Vascular: 2+ pedal pulses b/l; 2+ radial pulses b/l Extremities: 1-2+ edema in the bilateral lower extremities, no muscle wasting Neuro: strength and sensation intact in upper and lower extremities b/l     Labs on Admission: I have personally reviewed following labs and imaging studies  CBC: Recent Labs  Lab 04/24/22 2256  WBC 10.7*  HGB 11.3*  HCT 35.3*  MCV 88.9  PLT 218   Basic Metabolic Panel: Recent Labs  Lab 04/24/22 2256  NA 126*  K 4.4  CL 91*  CO2 21*  GLUCOSE 105*  BUN 9  CREATININE 1.01  CALCIUM 9.1  MG 1.7   GFR: Estimated Creatinine Clearance: 67.1 mL/min (by C-G formula based on SCr of 1.01 mg/dL). Liver Function Tests: No results for input(s): "AST", "ALT", "ALKPHOS", "BILITOT", "PROT", "ALBUMIN" in the last 168 hours. No results for input(s): "LIPASE", "AMYLASE" in the last 168 hours. No results for input(s): "AMMONIA" in the last 168 hours. Coagulation Profile: No results for input(s): "INR", "PROTIME" in the last 168 hours. Cardiac Enzymes: No results for input(s): "CKTOTAL", "CKMB", "CKMBINDEX", "TROPONINI" in the last 168 hours. BNP (last 3 results) Recent Labs    12/22/21 1149 01/20/22 0000 01/28/22 1059  PROBNP 639* CANCELED 379   HbA1C: No results for input(s): "HGBA1C" in the last 72 hours. CBG: No results for input(s): "GLUCAP" in the last 168 hours. Lipid Profile: No results for input(s): "CHOL", "HDL", "LDLCALC", "TRIG", "CHOLHDL", "LDLDIRECT" in the last 72 hours. Thyroid Function Tests: No results for input(s): "TSH",  "T4TOTAL", "FREET4", "T3FREE", "THYROIDAB" in the last 72 hours. Anemia Panel: No results for input(s): "VITAMINB12", "FOLATE", "FERRITIN", "TIBC", "IRON", "RETICCTPCT" in the last 72 hours. Urine analysis: No results found for: "COLORURINE", "APPEARANCEUR", "LABSPEC", "PHURINE", "GLUCOSEU", "HGBUR", "BILIRUBINUR", "KETONESUR", "PROTEINUR", "UROBILINOGEN", "NITRITE", "LEUKOCYTESUR"  Radiological Exams on Admission: DG Chest Port 1 View  Result Date: 04/24/2022 CLINICAL DATA:  Shortness of breath EXAM: PORTABLE CHEST 1 VIEW COMPARISON:  03/10/2022 FINDINGS: Cardiac shadow is enlarged but accentuated by the portable technique. Aortic calcifications are seen. Mild central vascular congestion is noted without edema. No focal infiltrate is noted. No  bony abnormality is seen. IMPRESSION: Mild central vascular congestion without focal edema. Electronically Signed   By: Alcide Clever M.D.   On: 04/24/2022 23:29      Assessment/Plan   Principal Problem:   Acute on chronic diastolic heart failure Active Problems:   Paroxysmal atrial fibrillation   Essential hypertension   Hyperlipidemia   COPD (chronic obstructive pulmonary disease)   BPH (benign prostatic hyperplasia)   Acquired hypothyroidism   Tobacco abuse   Acute hypoxic respiratory failure   Chronic hyponatremia   Anemia of chronic disease     #) Acute on chronic diastolic heart failure: dx of acute decompensation on the basis of presenting 1 week of progressive shortness of breath associate with orthopnea, worsening of edema in bilateral lower extremities, BNP that is nearly twice most recent prior value, with chest x-ray showing interval development of pulmonary vascular congestion. This is in the context of a known history of chronic diastolic heart failure, with most recent echocardiogram performed in May 2023, which was notable for LVEF 55% as well as indeterminate diastolic parameters, with additional results as conveyed above.  Etiology leading to presenting acutely decompensated heart failure is currently unclear.  It is noted that the patient is not currently on any scheduled diuretic medications as an outpatient, noting that his home torsemide is ordered on a as needed basis alone.  Presentation appears more consistent with acutely decompensated heart failure, although he is at risk for ensuing development of acute COPD exacerbation as a consequence of his suspected primary acute on chronic diastolic heart failure.  Overall, ACS leading to presenting acutely decompensated heart failure appears less likely at this time in the absence of any recent CP, presenting EKG showing no evidence of acute ischemic changes, as well as non-elevated troponin.    Plan: monitor strict I's & O's and daily weights. Monitor on telemetry. CMP in the [facility], including for monitoring trend of potassium, bicarbonate, and renal function in response to interval diuresis efforts.  Magnesium sulfate 2 g IV over 2 hours x 1 dose now, with repeat serum magnesium level to be checked in the [facility].  Resume home potassium chloride 20 equivalents p.o. daily.  Close monitoring of ensuing blood pressure response to diuresis efforts, including to help guide need for improvement in afterload reduction in order to optimize cardiac output. Lasix 40 mg IV x 1 now followed by Lasix 40 mg IV twice daily. Continue outpatient metoprolol surgery, but reduced dose to 25 mg p.o. daily in the setting of presenting acutely, send heart failure.  Continue home lisinopril.  Hold home amlodipine for now in order to closely monitor Rensing hemodynamic response to initiation of IV diuresis efforts.  Echocardiogram in the [facility].               #) Acute hypoxic respiratory distress: in the context of acute respiratory symptoms and no known baseline supplemental O2 requirements, presenting O2 sat note to be high 80s on room air, subsequently improving into the high 90s on 2  L nasal cannula, thereby meeting criteria for acute hypoxic respiratory distress as opposed to acute hypoxic respiratory failure at this time. Appears to be on basis of acute on chronic diastolic heart failure, as further detailed above, but will also closely monitor for ensuing development of secondary acute COPD exacerbation as a physiologic consequence of primary acute decompensated heart failure.   In terms of other considered etiologies, ACS appears less likely at this time in the absence of any recent  CP and in the context of troponin that is without significant elevation, which is particular reassuring given the duration of the patient's shortness of breath over the course last week, and presenting EKG showing no e/o acute ischemic process. Clinically, presentation is less suggestive of acute PE at this time, which is rendered that much less likely given the patient's reported good compliance on his chronic anticoagulation via home Eliquis.  Plan: further evaluation/management of presenting acute on chronic diastolic heart failure, including additional IV diuresis, as above. Monitor continuous pulse ox with prn supplemental O2 to maintain O2 sats greater than or equal to 92%. monitor on telemetry. CMP/CBC in the AM.  Magnesium sulfate 2 g IV x 1 dose now .  Recheck check serum Mg and check Phos levels. Check blood gas.  Resume home scheduled Trelegy.  As needed albuterol nebulizer.  Add on procalcitonin level.             #) Paroxysmal atrial fibrillation: Documented history of such. In setting of CHA2DS2-VASc score of  5, there is an indication for chronic anticoagulation for thromboembolic prophylaxis. Consistent with this, patient is chronically anticoagulated on Eliquis. Home AV nodal blocking regimen: Metoprolol succinate.  Most recent echocardiogram was performed in May 2023 and notable for LVEF 50 to 55%, indeterminate diastolic function, and several mild valvular pathologies, as  further detailed above. Presenting EKG read as demonstrating ectopic atrial rhythm, as further detailed above.  He is also on a rhythm control strategy as an outpatient, in the form of amiodarone.   Plan: monitor strict I's & O's and daily weights. CMP/CBC in AM. Check serum mag level. Continue home AV nodal blocking regimen, but at half dose for his metoprolol succinate in the setting of presenting acutely decompensated failure, as further detailed above.  Continue home Eliquis.  Continue amiodarone.  Monitor on symmetry.  Follow-up result of updated echocardiogram this been ordered for the [facility].             #) COPD: Documented history thereof, without clinical evidence of acute exacerbation at this time, although he is certainly at risk for ensuing development of such as a secondary consequence of primary acute on chronic diastolic heart failure, as above.  Outpatient respiratory regimen includes the following: Trelegy, along with as needed albuterol inhaler.   Plan: cont outpatient Trelegy. Prn albuterol nebulizer. Check CMP and serum magnesium level in the AM.  Check serum phosphorus level.  Counseled the patient on importance of reduction and ultimate discontinuation of cigarette smoking, as further detailed below.               #) Essential Hypertension: documented h/o such, with outpatient antihypertensive regimen including amlodipine, lisinopril, metoprolol succinate, Flomax.  SBP's in the ED today: 120s to 160s mmHg. given plan for IV diuresis, will hold home amlodipine for now to monitor ensuing blood pressure response to this initiation of loop diuretics.  Plan: Close monitoring of subsequent BP via routine VS. continue home lisinopril.  Continue home Flomax.  Reduce beta-blocker by half in the setting of acute decompensated heart failure, as above.  Hold amlodipine for now, as above.              #) Hyperlipidemia: documented h/o such. On high intensity  atorvastatin as outpatient.   Plan: continue home statin.                 #) acquired hypothyroidism: documented h/o such, on Synthroid as outpatient.   Plan: cont home  Synthroid.                 #) Benign Prostatic Hyperplasia:  documented h/o such; on tamsulosin as outpatient.   Plan: monitor strict I's & O's and daily weights. Repeat CMP in AM.  continue home tamsulosin.             #) Chronic tobacco abuse: Patient conveys that they are a current smoker, having smoked 1 ppd for greater than 60 years.   Plan: Counseled the patient for less than 2 minutes on the importance of complete smoking discontinuation.  Order placed for prn nicotine patch for use during this hospitalization.             #) Chronic hyponatremia: Documented history of such, with baseline serum sodium range noted to be 1 24-1 32 dating back to August 2022.  Presenting serum sodium level to be consistent with his baseline range.  Will closely trend ensuing serum sodium level, particular in the context of presenting acute on chronic diastolic heart failure with plan for additional IV diuresis efforts, as specified above.  Plan: Monitor strict I's and O's and O weights.  Repeat CMP in the [facility].           #) Anemia of chronic disease: Documented history of such, a/w with baseline hgb range 11-12, with presenting hgb consistent with this range, in the absence of any overt evidence of active bleed.     Plan: Repeat CBC in the [facility].       DVT prophylaxis: SCD's plus home Eliquis Code Status: Full code Family Communication: none Disposition Plan: Per Rounding Team Consults called: none;  Admission status: Inpatient     I SPENT GREATER THAN 75  MINUTES IN CLINICAL CARE TIME/MEDICAL DECISION-MAKING IN COMPLETING THIS ADMISSION.      Chaney BornJustin B Kielyn Kardell DO [facility]  From 7PM - 7AM   04/25/2022, 1:16 AM

## 2022-04-25 NOTE — Assessment & Plan Note (Signed)
Continue with atorvastatin 

## 2022-04-25 NOTE — Assessment & Plan Note (Signed)
Calculated BMI 32.7

## 2022-04-25 NOTE — Assessment & Plan Note (Signed)
Patient with atrial rhythm with PAC with rate in the 50, will continue amiodarone 200 mg po daily and will decrease metoprolol succinate to 12,5 mg Continue telemetry monitoring.  Anticoagulation with apixaban.

## 2022-04-25 NOTE — Progress Notes (Signed)
Progress Note   Patient: Kyle Farley XBM:[id number] DOB: Jun 30, 1940 DOA: 04/24/2022     0 DOS: the patient was seen and examined on 04/25/2022   Brief hospital course: Mr. Stegen was admitted to the hospital with the working diagnosis of heart failure exacerbation.   82 yo male with the past medical history of heart failure, paroxysmal atrial fibrillation, COPD, and dyslipidemia who presented with dyspnea. Reported one week of progressive dyspnea, associated with orthopnea, and worsening lower extremity edema. Positive wheezing and non productive cough. On his initial physical examination his blood pressure was 168/67, HR 53, RR 23 and 02 saturation 99%, lungs with no wheezing or rales, heart with S2 and S2 present and regular with no gallops, or murmurs, abdomen with no distention and positive lower extremity edema.   Na 126, K 4,4 Cl 91, bicarbonate 21, glucose 105, bun 9  cr 1,0 Mag 1,7  BNP 409 High sensitive troponin 23 and 22 Wbc 10,7 hgb 11,3 plt 218   Chest radiograph with cardiomegaly and bilateral hilar vascular congestion, positive right base atelectasis and hyperinflation.   #1 EKG 38 bpm, normal axis, right bundle branch block, junctional rhythm with no significant ST segment or T wave changes.  #2 EKG 54 bpm, normal axis, right bundle branch block, atrial rhythm with no significant ST segment or T wave changes.   Patient was placed on furosemide for diuresis.   Assessment and Plan: * Acute on chronic diastolic heart failure Echocardiogram from 2022 with mild reduction in systolic function, LV 40 to 45%.   Documented urine output is 2,800 cc Systolic blood pressure is 104 to 125 mmHg.   Plan to reduce metoprolol to 12,5 mg due to bradycardia.  Improved volume status, add SGLT 2 inh and spironolactone. Transition from lisinopril to losartan. Check echocardiogram.  Transition to loop diuretic tomorrow.   COPD exacerbation Patient with dyspnea, wheezing, increase sputum  production and wheezing.  Will continue systemic corticosteroids.  Add bronchodilator therapy, inhaled corticosteroids and airway clearing techniques.  Antitussive agents.   Smoking cessation counseling.   Paroxysmal atrial fibrillation Patient with atrial rhythm with PAC with rate in the 50, will continue amiodarone 200 mg po daily and will decrease metoprolol succinate to 12,5 mg Continue telemetry monitoring.  Anticoagulation with apixaban.   Essential hypertension Continue blood pressure monitoring, will transition from lisinopril to losartan. Check echocardiogram and continue diuresis.   Acquired hypothyroidism Continue levothyroxine.  Anemia of chronic disease Hgb stable.  Continue to follow up cell count.   Hyperlipidemia Continue with atorvastatin.   Class 1 obesity Calculated BMI 32.7         Subjective: Patient continue to have dyspnea and wheezing, positive cough, edema has improved.   Physical Exam: Vitals:   04/25/22 0416 04/25/22 0640 04/25/22 0828 04/25/22 1214  BP: (!) 165/76  104/82 (!) 124/93  Pulse: (!) 59  (!) 59 (!) 56  Resp: 20  16 18   Temp: 98.1 F (36.7 C) 97.7 F (36.5 C) 98.1 F (36.7 C) 98.4 F (36.9 C)  TempSrc: Oral Oral Oral Oral  SpO2: 92%  95% 96%  Weight:      Height:       Neurology awake and alert ENT with mild pallor Cardiovascular with S1 and S2 present and regular with no gallops, rubs or murmurs No JVD No lower extremity edema Respiratory with prolonged expiratory phase, expiratory wheezing and bilateral scattered rhonchi with no rales Abdomen with no distention   Data Reviewed:  Family Communication: no family at the bedside   Disposition: Status is: Inpatient Remains inpatient appropriate because: heart failure and COPD exacerbation   Planned Discharge Destination: Home      Author: Coralie Keens, MD 04/25/2022 2:17 PM  For on call review [email or web address].

## 2022-04-25 NOTE — Assessment & Plan Note (Signed)
Continue blood pressure monitoring, will transition from lisinopril to losartan. Check echocardiogram and continue diuresis.

## 2022-04-26 ENCOUNTER — Telehealth: Payer: Self-pay

## 2022-04-26 ENCOUNTER — Other Ambulatory Visit (HOSPITAL_COMMUNITY): Payer: Self-pay

## 2022-04-26 ENCOUNTER — Encounter (HOSPITAL_COMMUNITY): Payer: Self-pay | Admitting: Internal Medicine

## 2022-04-26 DIAGNOSIS — I480 Paroxysmal atrial fibrillation: Secondary | ICD-10-CM | POA: Diagnosis not present

## 2022-04-26 DIAGNOSIS — E871 Hypo-osmolality and hyponatremia: Secondary | ICD-10-CM

## 2022-04-26 DIAGNOSIS — I10 Essential (primary) hypertension: Secondary | ICD-10-CM | POA: Diagnosis not present

## 2022-04-26 DIAGNOSIS — I5033 Acute on chronic diastolic (congestive) heart failure: Secondary | ICD-10-CM | POA: Diagnosis not present

## 2022-04-26 DIAGNOSIS — J441 Chronic obstructive pulmonary disease with (acute) exacerbation: Secondary | ICD-10-CM | POA: Diagnosis not present

## 2022-04-26 HISTORY — DX: Hypo-osmolality and hyponatremia: E87.1

## 2022-04-26 LAB — MAGNESIUM: Magnesium: 2.4 mg/dL (ref 1.7–2.4)

## 2022-04-26 LAB — BASIC METABOLIC PANEL
Anion gap: 8 (ref 5–15)
BUN: 24 mg/dL — ABNORMAL HIGH (ref 8–23)
CO2: 26 mmol/L (ref 22–32)
Calcium: 8.7 mg/dL — ABNORMAL LOW (ref 8.9–10.3)
Chloride: 92 mmol/L — ABNORMAL LOW (ref 98–111)
Creatinine, Ser: 1.22 mg/dL (ref 0.61–1.24)
GFR, Estimated: 60 mL/min — ABNORMAL LOW (ref >60)
Glucose, Bld: 151 mg/dL — ABNORMAL HIGH (ref 70–99)
Potassium: 4.1 mmol/L (ref 3.5–5.1)
Sodium: 126 mmol/L — ABNORMAL LOW (ref 135–145)

## 2022-04-26 MED ORDER — SUCRALFATE 1 GM/10ML PO SUSP
1.0000 g | Freq: Three times a day (TID) | ORAL | Status: DC
  Administered 2022-04-26 – 2022-04-30 (×15): 1 g via ORAL
  Filled 2022-04-26 (×15): qty 10

## 2022-04-26 NOTE — Assessment & Plan Note (Signed)
Renal function with serum cr at 1,2 with K at 4,1 and serum bicarbonate at 26. Mg is 2,4 and Na 126.  Continue to hold on furosemide. Liberate diet and consult nutrition for evaluation.  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

## 2022-04-26 NOTE — Progress Notes (Signed)
Heart Failure Nurse Navigator Progress Note  PCP: Janie Morning, NP (Inactive) PCP-Cardiologist: Dulce Sellar Admission Diagnosis: Acute on chronic congestive heart failure, Hyponatremia.  Admitted from: Home via EMS  Presentation:   Kyle Farley presented with shortness of breath, chest pain, HR 38, complete heart block per EMS. Chronic lower extremity edema, BP 168/67, HR 52, BNP 409, EKG showed junctional rhythm, CXR with mild central vascular congestion with out edema.   Patient was educated on the sign and symptoms of heart failure, daily weights, when to call his doctor or go to the ED, Diet/ fluid restrictions, reported patient lives with his son and they mainly eat take out food everyday, patient does like to use salt, Spoke about taking all his medication as prescribed and attending all medical appointments, Patient reported to drinking around 4-5 beers nightly and still smoke, he stated he does remove his oxygen and go to the other side of the room before he smokes. At this time, has no interest in quitting. Patient verbalized his understanding of education, a HF TOC appointment was scheduled for 05/10/2022 @ 11 am.   ECHO/ LVEF: 60-65% Acute HFpEF  Clinical Course:  Past Medical History:  Diagnosis Date   Alcohol use 09/12/2020   Arthritis    CHF (congestive heart failure)    Cigarette smoker 10/21/2014   COPD (chronic obstructive pulmonary disease)    COPD exacerbation 01/24/2017   Dyspnea    Dysrhythmia    atrial fibrillation   Essential hypertension 10/21/2014   Femur fracture, right 09/12/2020   GERD (gastroesophageal reflux disease)    History of kidney stones    Hyperlipidemia 01/24/2017   Hypertension    Kidney stones 01/24/2017   Paroxysmal atrial fibrillation 10/21/2014   Pressure injury of skin 09/16/2020     Social History   Socioeconomic History   Marital status: Widowed    Spouse name: Not on file   Number of children: 3   Years of education: Not on file    Highest education level: High school graduate  Occupational History   Occupation: retired  Tobacco Use   Smoking status: Every Day    Packs/day: 1.00    Years: 62.00    Additional pack years: 0.00    Total pack years: 62.00    Types: Cigarettes, Cigars   Smokeless tobacco: Never  Vaping Use   Vaping Use: Never used  Substance and Sexual Activity   Alcohol use: Yes    Alcohol/week: 5.0 standard drinks of alcohol    Types: 5 Standard drinks or equivalent per week    Comment: 4-5 beers per day   Drug use: No   Sexual activity: Not Currently  Other Topics Concern   Not on file  Social History Narrative   Not on file   Social Determinants of Health   Financial Resource Strain: Low Risk  (04/26/2022)   Overall Financial Resource Strain (CARDIA)    Difficulty of Paying Living Expenses: Not very hard  Food Insecurity: No Food Insecurity (04/15/2022)   Hunger Vital Sign    Worried About Running Out of Food in the Last Year: Never true    Ran Out of Food in the Last Year: Never true  Transportation Needs: No Transportation Needs (04/26/2022)   PRAPARE - Administrator, Civil Service (Medical): No    Lack of Transportation (Non-Medical): No  Physical Activity: Inactive (04/15/2022)   Exercise Vital Sign    Days of Exercise per Week: 0 days  Minutes of Exercise per Session: 0 min  Stress: No Stress Concern Present (04/15/2022)   [employer] of Occupational Health - Occupational Stress Questionnaire    Feeling of Stress : Not at all  Social Connections: Moderately Isolated (04/15/2022)   Social Connection and Isolation Panel [NHANES]    Frequency of Communication with Friends and Family: More than three times a week    Frequency of Social Gatherings with Friends and Family: More than three times a week    Attends Religious Services: More than 4 times per year    Active Member of [employer] or Organizations: No    Attends Banker Meetings: Never    Marital  Status: Widowed   Education Assessment and Provision:  Detailed education and instructions provided on heart failure disease management including the following:  Signs and symptoms of Heart Failure When to call the physician Importance of daily weights Low sodium diet Fluid restriction Medication management Anticipated future follow-up appointments  Patient education given on each of the above topics.  Patient acknowledges understanding via teach back method and acceptance of all instructions.  Education Materials:  "Living Better With Heart Failure" Booklet, HF zone tool, & Daily Weight Tracker Tool.  Patient has scale at home: yes Patient has pill box at home: NA    High Risk Criteria for Readmission and/or Poor Patient Outcomes: Heart failure hospital admissions (last 6 months): 1  No Show rate: 12 % Difficult social situation: No, lives with his son.  Demonstrates medication adherence: Yes Primary Language: English Literacy level: reading, writing, and comprehension  Barriers of Care:   Diet/ fluid restrictions ( salt and take out food) daily weights Drinks 4-5 beers per night/ smoking Continued HF education   Considerations/Referrals:   Referral made to Heart Failure Pharmacist Stewardship: Yes Referral made to Heart Failure CSW/NCM TOC: Yes, smoking/ ETOH cessation Referral made to Heart & Vascular TOC clinic: yes, 05/10/2022 @ 11 am.   Items for Follow-up on DC/TOC: Diet/ fluid restrictions ( salt and take out food) Drinks 4-5 beers per night/ Smoker Daily weights Continued HF education   Rhae Hammock, BSN, Energy manager Chat Only

## 2022-04-26 NOTE — Evaluation (Signed)
Physical Therapy Evaluation Patient Details Name: Kyle Farley MRN: [id number] DOB: 1940-01-31 Today's Date: 04/26/2022  History of Present Illness  Pt is a 82 yo male admitted with dyspena, LE edema. Pt with CHF an COPD exacerbation.  PHM: CHF, COPD, afib  Clinical Impression  Pt presents with admitting diagnosis above. Co treat with PT. Pt was able to ambulate in hallway with rollator at supervision level. Pt likely presents near baseline and would likely just need 1 additional visit to navigate stairs prior to DC. Do not anticipate that pt will have any post acute needs. PT will continue to follow.     Recommendations for follow up therapy are one component of a multi-disciplinary discharge planning process, led by the attending physician.  Recommendations may be updated based on patient status, additional functional criteria and insurance authorization.  Follow Up Recommendations       Assistance Recommended at Discharge Intermittent Supervision/Assistance  Patient can return home with the following  A little help with walking and/or transfers;A lot of help with bathing/dressing/bathroom;Assistance with cooking/housework;Assist for transportation;Help with stairs or ramp for entrance    Equipment Recommendations None recommended by PT (PT has needed DME)  Recommendations for Other Services       Functional Status Assessment Patient has had a recent decline in their functional status and demonstrates the ability to make significant improvements in function in a reasonable and predictable amount of time.     Precautions / Restrictions Precautions Precautions: Fall Precaution Comments: Pt has had one fall in last 6 months Restrictions Weight Bearing Restrictions: No      Mobility  Bed Mobility               General bed mobility comments: in chair on arrival and sleeps in recliner at home as well.    Transfers Overall transfer level: Needs assistance Equipment used:  Rollator (4 wheels) Transfers: Sit to/from Stand Sit to Stand: Supervision           General transfer comment: safe with all transfers. Needs frequent rest breaks.    Ambulation/Gait Ambulation/Gait assistance: Supervision Gait Distance (Feet): 150 Feet (1 seated rest break) Assistive device: Rollator (4 wheels) Gait Pattern/deviations: Decreased stride length, Step-through pattern, Trunk flexed Gait velocity: decreased     General Gait Details: no LOB noted. Pt required 1 standing rest break  Stairs            Wheelchair Mobility    Modified Rankin (Stroke Patients Only)       Balance Overall balance assessment: Mild deficits observed, not formally tested                                           Pertinent Vitals/Pain Pain Assessment Pain Assessment: No/denies pain    Home Living Family/patient expects to be discharged to:: Private residence Living Arrangements: Children Available Help at Discharge: Available PRN/intermittently Type of Home: House Home Access: Stairs to enter Entrance Stairs-Rails: Doctor, general practice of Steps: 4   Home Layout: One level Home Equipment: BSC/3in1;Cane - single point;Rollator (4 wheels);Electric scooter      Prior Function Prior Level of Function : Independent/Modified Independent;Driving;History of Falls (last six months)             Mobility Comments: uses rollator and scooter outside.  Inside uses rollator or nothing ADLs Comments: son cleans. Eats a lot of fast food.  Needs help with socks so just does not wear them.     Hand Dominance   Dominant Hand: Right    Extremity/Trunk Assessment   Upper Extremity Assessment Upper Extremity Assessment: Defer to OT evaluation    Lower Extremity Assessment Lower Extremity Assessment: Overall WFL for tasks assessed    Cervical / Trunk Assessment Cervical / Trunk Assessment: Normal  Communication   Communication: No difficulties   Cognition Arousal/Alertness: Awake/alert Behavior During Therapy: WFL for tasks assessed/performed Overall Cognitive Status: Within Functional Limits for tasks assessed                                          General Comments General comments (skin integrity, edema, etc.): VSS on 3L. Pt reported SOB during amb however O2 sats remained above 97%.    Exercises     Assessment/Plan    PT Assessment Patient needs continued PT services  PT Problem List Decreased strength;Decreased activity tolerance;Decreased balance;Decreased mobility;Decreased knowledge of use of DME;Cardiopulmonary status limiting activity       PT Treatment Interventions Gait training;Functional mobility training;Therapeutic activities;Therapeutic exercise;Balance training;Neuromuscular re-education;Patient/family education;Stair training;DME instruction    PT Goals (Current goals can be found in the Care Plan section)  Acute Rehab PT Goals Patient Stated Goal: to go home PT Goal Formulation: With patient Time For Goal Achievement: 05/10/22 Potential to Achieve Goals: Good    Frequency Min 1X/week     Co-evaluation   Reason for Co-Treatment: Complexity of the patient's impairments (multi-system involvement) PT goals addressed during session: Mobility/safety with mobility OT goals addressed during session: ADL's and self-care       AM-PAC PT "6 Clicks" Mobility  Outcome Measure Help needed turning from your back to your side while in a flat bed without using bedrails?: A Little Help needed moving from lying on your back to sitting on the side of a flat bed without using bedrails?: A Little Help needed moving to and from a bed to a chair (including a wheelchair)?: A Little Help needed standing up from a chair using your arms (e.g., wheelchair or bedside chair)?: A Little Help needed to walk in hospital room?: A Little Help needed climbing 3-5 steps with a railing? : A Little 6 Click  Score: 18    End of Session Equipment Utilized During Treatment: Gait belt;Oxygen Activity Tolerance: Patient tolerated treatment well Patient left: in chair;with call bell/phone within reach;with chair alarm set Nurse Communication: Mobility status PT Visit Diagnosis: Other abnormalities of gait and mobility (R26.89)    Time: 4709-6283 PT Time Calculation (min) (ACUTE ONLY): 27 min   Charges:   PT Evaluation $PT Eval Moderate Complexity: 1 Mod PT Treatments $Gait Training: 8-22 mins        Shela Nevin, PT, DPT Acute Rehab Services [phone]   Gladys Damme 04/26/2022, 10:33 AM

## 2022-04-26 NOTE — TOC Initial Note (Addendum)
Transition of Care [facility]) - Initial/Assessment Note    Patient Details  Name: Kyle Farley MRN: [id number] Date of Birth: 11-13-1940  Transition of Care [facility]) CM/SW Contact:    Leone Haven, RN Phone Number: 04/26/2022, 3:30 PM  Clinical Narrative:                 From home with son, Kyle Farley, who is his support person [phone].  Patient states he uses oxygen at night 3 liters, he has a scooter, chair walker, and a cane.  Per pt/ot eval no f/u needed.  Son will transport him home at dc.PCP is [name], he could not remember MD name but the one listed on file in chart is not correct. NCM tried to make follow up apt , they said they will call patient's son back with the apt time and date, they could not give me one over the phone yet.   Expected Discharge Plan: Home/Self Care Barriers to Discharge: Continued Medical Work up   Patient Goals and CMS Choice Patient states their goals for this hospitalization and ongoing recovery are:: return home with son   Choice offered to / list presented to : NA      Expected Discharge Plan and Services In-house Referral: NA Discharge Planning Services: CM Consult Post Acute Care Choice: NA Living arrangements for the past 2 months: Single Family Home                 DME Arranged: N/A DME Agency: NA       HH Arranged: NA          Prior Living Arrangements/Services Living arrangements for the past 2 months: Single Family Home Lives with:: Adult Children Patient language and need for interpreter reviewed:: Yes Do you feel safe going back to the place where you live?: Yes      Need for Family Participation in Patient Care: Yes (Comment) Care giver support system in place?: Yes (comment) Current home services: DME (scooter, chair walker, cane, home oxygen 3 liters at night) Criminal Activity/Legal Involvement Pertinent to Current Situation/Hospitalization: No - Comment as needed  Activities of Daily Living Home Assistive  Devices/Equipment: Cane (specify quad or straight), Eyeglasses, Walker (specify type), Other (Comment) (motorized scooter) ADL Screening (condition at time of admission) Patient's cognitive ability adequate to safely complete daily activities?: Yes Is the patient deaf or have difficulty hearing?: No Does the patient have difficulty seeing, even when wearing glasses/contacts?: No Does the patient have difficulty concentrating, remembering, or making decisions?: No Patient able to express need for assistance with ADLs?: Yes Does the patient have difficulty dressing or bathing?: Yes Independently performs ADLs?: No Communication: Independent Dressing (OT): Needs assistance Is this a change from baseline?: Pre-admission baseline Grooming: Independent Feeding: Independent Bathing: Needs assistance Is this a change from baseline?: Pre-admission baseline Toileting: Needs assistance Is this a change from baseline?: Pre-admission baseline In/Out Bed: Needs assistance Is this a change from baseline?: Pre-admission baseline Walks in Home: Dependent Is this a change from baseline?: Pre-admission baseline Does the patient have difficulty walking or climbing stairs?: Yes Weakness of Legs: Both Weakness of Arms/Hands: None  Permission Sought/Granted                  Emotional Assessment   Attitude/Demeanor/Rapport: Engaged Affect (typically observed): Appropriate Orientation: : Oriented to Self, Oriented to Place, Oriented to  Time, Oriented to Situation Alcohol / Substance Use: Not Applicable    Admission diagnosis:  Acute on  chronic diastolic heart failure [I50.33] Hyponatremia [E87.1] Acute on chronic congestive heart failure, unspecified heart failure type [I50.9] Patient Active Problem List   Diagnosis Date Noted   Hyponatremia 04/26/2022   Acute on chronic diastolic heart failure 04/25/2022   Acute hypoxic respiratory failure 04/25/2022   Chronic hyponatremia 04/25/2022    Anemia of chronic disease 04/25/2022   Class 1 obesity 04/25/2022   Encounter for prostate cancer screening 02/16/2022   Encounter for screening for lung cancer 02/16/2022   Tobacco abuse 02/16/2022   Bilateral leg edema 02/16/2022   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 11/10/2021   Acute on chronic systolic heart failure 11/10/2021   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   Atherosclerosis of native arteries of extremities with intermittent claudication, bilateral legs 11/10/2021   BPH (benign prostatic hyperplasia) 11/10/2021   Acquired hypothyroidism 11/10/2021   Pressure injury of skin 09/16/2020   Femur fracture, right 09/12/2020   Alcohol use 09/12/2020   Hypertension    History of kidney stones    GERD (gastroesophageal reflux disease)    Dysrhythmia    Dyspnea    COPD (chronic obstructive pulmonary disease)    Arthritis    COPD exacerbation 01/24/2017   Kidney stones 01/24/2017   Hyperlipidemia 01/24/2017   CHF (congestive heart failure) 01/24/2017   Paroxysmal atrial fibrillation 10/21/2014   Essential hypertension 10/21/2014   Cigarette smoker 10/21/2014   PCP:  Janie Morning, NP (Inactive) Pharmacy:   [facility] [address] [place] [address] Phone: [phone] Fax: [phone]  [facility] [address] [place] [address] Phone: [phone] Fax: [phone]     Social Determinants of Health (SDOH) Social History: SDOH Screenings   Food Insecurity: No Food Insecurity (04/15/2022)  Housing: Low Risk  (04/26/2022)  Transportation Needs: No Transportation Needs (04/26/2022)  Utilities: Not At Risk (04/15/2022)  Alcohol Screen: Low Risk  (04/26/2022)  Depression (PHQ2-9): Low Risk  (04/02/2022)  Financial Resource Strain: Low Risk  (04/26/2022)  Physical Activity: Inactive (04/15/2022)  Social Connections: Moderately Isolated (04/15/2022)  Stress: No Stress  Concern Present (04/15/2022)  Tobacco Use: High Risk (04/26/2022)   SDOH Interventions: Housing Interventions: Intervention Not Indicated Transportation Interventions: Intervention Not Indicated Alcohol Usage Interventions: Intervention Not Indicated (Score <7) Financial Strain Interventions: Intervention Not Indicated   Readmission Risk Interventions    04/26/2022    3:28 PM  Readmission Risk Prevention Plan  Transportation Screening Complete  PCP or Specialist Appt within 3-5 Days Complete  HRI or Home Care Consult Complete  Palliative Care Screening Not Applicable  Medication Review (RN Care Manager) Complete

## 2022-04-26 NOTE — Telephone Encounter (Signed)
A lady with the hospital called regarding this patient. She stated that he is currently admitted into [facility]. He is going to be discharged on either tomorrow or on Wednesday. He will need a one week follow-up from discharge date. She asked that we call his son Jerilynn Som back with an appointment at [phone].  Dr. Sedalia Muta, when/where can we schedule this patient?

## 2022-04-26 NOTE — Progress Notes (Addendum)
Progress Note   Patient: Kyle Farley QDU:[id number] DOB: 20-Jan-1940 DOA: 04/24/2022     1 DOS: the patient was seen and examined on 04/26/2022   Brief hospital course: Kyle Farley was admitted to the hospital with the working diagnosis of heart failure exacerbation.   82 yo male with the past medical history of heart failure, paroxysmal atrial fibrillation, COPD, and dyslipidemia who presented with dyspnea. Reported one week of progressive dyspnea, associated with orthopnea, and worsening lower extremity edema. Positive wheezing and non productive cough. On his initial physical examination his blood pressure was 168/67, HR 53, RR 23 and 02 saturation 99%, lungs with no wheezing or rales, heart with S2 and S2 present and regular with no gallops, or murmurs, abdomen with no distention and positive lower extremity edema.   Na 126, K 4,4 Cl 91, bicarbonate 21, glucose 105, bun 9  cr 1,0 Mag 1,7  BNP 409 High sensitive troponin 23 and 22 Wbc 10,7 hgb 11,3 plt 218   Chest radiograph with cardiomegaly and bilateral hilar vascular congestion, positive right base atelectasis and hyperinflation.   #1 EKG 38 bpm, normal axis, right bundle branch block, junctional rhythm with no significant ST segment or T wave changes.  #2 EKG 54 bpm, normal axis, right bundle branch block, atrial rhythm with no significant ST segment or T wave changes.   Patient was placed on furosemide for diuresis.   04/08 patient with improvement in volume status, continue to have dyspnea due to COPD exacerbation.   Assessment and Plan: * Acute on chronic diastolic heart failure Echocardiogram with preserved LV systolic function with EF 60 to 65%, no wall motion abnormalities, mild LVH, RV with moderate dilatation, RV systolic function low normal, LA and RA with moderate dilatation, mild to moderate aortic stenosis.   Documented urine output is 3,700  cc Systolic blood pressure is 115 to 117 mmHg.   Plan to continue with  metoprolol, losartan, SGLT 2 inh and spironolactone. Hold on furosemide for today.   COPD exacerbation Dyspnea improving but not back to baseline.  Plan to continue systemic corticosteroids.  Bronchodilator therapy, inhaled corticosteroids and airway clearing techniques.  Antitussive agents.   Smoking cessation counseling.   Paroxysmal atrial fibrillation Continue with low dose metoprolol and amiodarone 200 mg po daily. Anticoagulation with apixaban.  Continue telemetry monitoring.   Essential hypertension Continue blood pressure control with metoprolol and losartan. Hold on loop diuretic therapy for now.   Acquired hypothyroidism Continue levothyroxine.  Anemia of chronic disease Hgb stable.  Continue to follow up cell count.   Hyperlipidemia Continue with atorvastatin.   Class 1 obesity Calculated BMI 32.7   Hyponatremia Renal function with serum cr at 1,2 with K at 4,1 and serum bicarbonate at 26. Mg is 2,4 and Na 126.  Continue to hold on furosemide. Liberate diet and consult nutrition for evaluation.  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         Subjective: patient with improvement in dyspnea but not back to baseline, edema has improved. Positive acid reflux with dyspepsia. He was able to sleep well last night.   Physical Exam: Vitals:   04/26/22 0455 04/26/22 0731 04/26/22 0747 04/26/22 0951  BP: (!) 117/50  (!) 115/58   Pulse: 60 (!) 55 (!) 54 (!) 50  Resp: 19  19   Temp: 98 F (36.7 C)  (!) 97.5 F (36.4 C)   TempSrc: Oral  Oral   SpO2: 96% 97% 96%   Weight: 95.8 kg  Height:       Neurology awake and alert ENT with mild pallor Cardiovascular with S1 and S2 present and regular with no gallops, rubs or murmurs No JVD No lower extremity edema Respiratory with prolonged expiratory phase, positive expiratory wheezing and scattered rhonchi. Abdomen with no distention  Data Reviewed:    Family Communication: no family at the bedside    Disposition: Status is: Inpatient Remains inpatient appropriate because: heart failure and COPD  Planned Discharge Destination: Home      Author: Coralie Keens, MD 04/26/2022 10:12 AM  For on call review [email or web address].

## 2022-04-26 NOTE — TOC Benefit Eligibility Note (Signed)
Patient Product/process development scientist completed.    The patient is currently admitted and upon discharge could be taking Entresto 24-26 mg.  The current 30 day co-pay is $47.00.   The patient is currently admitted and upon discharge could be taking Farxiga 10 mg.  The current 30 day co-pay is $47.00.   The patient is currently admitted and upon discharge could be taking Jardiance 10 mg.  The current 30 day co-pay is $47.00.   The patient is insured through [employer] Part D   This test claim was processed through [facility]- copay amounts may vary at other pharmacies due to pharmacy/plan contracts, or as the patient moves through the different stages of their insurance plan.  Roland Earl, CPHT Pharmacy Patient Advocate Specialist [facility] Patient Advocate Team Direct Number: [phone]  Fax: [phone]

## 2022-04-26 NOTE — Progress Notes (Addendum)
   Heart Failure Stewardship Pharmacist Progress Note   PCP: Janie Morning, NP (Inactive) PCP-Cardiologist: Norman Herrlich, MD    HPI:  82 yo M with PMH of CHF, afib, COPD, HTN, HLD, and hypothyroidism.  Presented to the ED on 4/6 with shortness of breath, orthopnea, and chest pain. Found to bradycardic in the 30s on arrival. Chronic LE edema. CXR with mild central vascular congestion without focal edema. ECHO 4/7 showed LVEF 60-65% (was 40-45% in 2022), no regional wall motion abnormalities, mild concentric LVH, and G2DD.   Current HF Medications: Beta Blocker: metoprolol XL 12.5 mg daily ACE/ARB/ARNI: losartan 25 mg daily MRA: spironolactone 12.5 mg daily SGLT2i: Jardiance 10 mg daily  Prior to admission HF Medications: Diuretic: torsemide 20 mg BID Beta blocker: metoprolol XL 50 mg daily ACE/ARB/ARNI: lisinopril 20 mg daily  Pertinent Lab Values: Serum creatinine 1.22, BUN 24, Potassium 4.1, Sodium 126, BNP 409.6, Magnesium 2.4  Vital Signs: Weight: 211 lbs (admission weight: 222 lbs) Blood pressure: 110/50s  Heart rate: 50s  I/O: -5.8L yesterday; net -5.9L  Medication Assistance / Insurance Benefits Check: Does the patient have prescription insurance?  Yes Type of insurance plan: Monia Pouch Medicare  Does the patient qualify for medication assistance through manufacturers or grants?   Pending Eligible grants and/or patient assistance programs: pending Medication assistance applications in progress: none  Medication assistance applications approved: none Approved medication assistance renewals will be completed by: pending  Outpatient Pharmacy:  Prior to admission outpatient pharmacy: [facility] Is the patient willing to use [facility] TOC pharmacy at discharge? Yes Is the patient willing to transition their outpatient pharmacy to utilize a [facility] outpatient pharmacy?   Yes - transitioned to [facility] mail order    Assessment: 1. Acute on chronic diastolic CHF (LVEF 60-65%). NYHA  class II symptoms. - Off IV lasix, shortness of breath thought to be due to COPD exacerbation.  - Agree with reducing to metoprolol XL 12.5 mg daily, monitor HR - Agree with transitioning from lisinopril 20 mg daily to losartan 25 mg daily - Continue spironolactone 12.5 mg daily  - Continue Jardiance 10 mg daily   Plan: 1) Medication changes recommended at this time: - Agree with changes  2) Patient assistance: Sherryll Burger copay $47 - Farxiga/Jardiance copay $47  3)  Education  - Patient has been educated on current HF medications and potential additions to HF medication regimen - Patient verbalizes understanding that over the next few months, these medication doses may change and more medications may be added to optimize HF regimen - Patient has been educated on basic disease state pathophysiology and goals of therapy   Sharen Hones, PharmD, BCPS Heart Failure Stewardship Pharmacist Phone [phone]

## 2022-04-26 NOTE — Evaluation (Signed)
Occupational Therapy Evaluation Patient Details Name: Kyle RossDarrell L Farley MRN: [id number] DOB: 01/09/1941 Today's Date: 04/26/2022   History of Present Illness Pt is a 82 yo male admitted with dyspena, LE edema. Pt with CHF an COPD exacerbation.  PHM: CHF, COPD, afib   Clinical Impression   Pt admitted with the above diagnosis and has the deficits listed below. Pt would benefit from cont OT to increase awareness of energy conservation techniques and increase independence with LE dressing.  Pt is close to baseline with adls but is alone a good bit of the day.  Feel he is safe to d/c home. Recommended pt always have phone in his pocket and has people checking in on him regularly.        Recommendations for follow up therapy are one component of a multi-disciplinary discharge planning process, led by the attending physician.  Recommendations may be updated based on patient status, additional functional criteria and insurance authorization.   Assistance Recommended at Discharge PRN  Patient can return home with the following A little help with bathing/dressing/bathroom;Assistance with cooking/housework    Functional Status Assessment  Patient has had a recent decline in their functional status and demonstrates the ability to make significant improvements in function in a reasonable and predictable amount of time.  Equipment Recommendations  None recommended by OT    Recommendations for Other Services       Precautions / Restrictions Precautions Precautions: Fall Precaution Comments: Pt has had one fall in last 6 months Restrictions Weight Bearing Restrictions: No      Mobility Bed Mobility               General bed mobility comments: in chair on arrival and sleeps in recliner at home as well.    Transfers Overall transfer level: Needs assistance Equipment used: Rollator (4 wheels) Transfers: Sit to/from Stand Sit to Stand: Supervision           General transfer comment:  safe with all transfers. Needs frequent rest breaks.      Balance Overall balance assessment: Mild deficits observed, not formally tested                                         ADL either performed or assessed with clinical judgement   ADL Overall ADL's : Needs assistance/impaired Eating/Feeding: Independent;Sitting   Grooming: Supervision/safety;Standing Grooming Details (indicate cue type and reason): rest breaks needed to sit Upper Body Bathing: Set up;Sitting   Lower Body Bathing: Minimal assistance;Sit to/from stand;Cueing for compensatory techniques   Upper Body Dressing : Set up;Sitting   Lower Body Dressing: Moderate assistance;Sit to/from stand;Cueing for compensatory techniques Lower Body Dressing Details (indicate cue type and reason): pt has reacher and shoe horn at home. Toilet Transfer: Supervision/safety;Ambulation;BSC/3in1;Rollator (4 wheels) Toilet Transfer Details (indicate cue type and reason): Pt walked to bathroom Toileting- Clothing Manipulation and Hygiene: Supervision/safety;Sit to/from stand       Functional mobility during ADLs: Supervision/safety General ADL Comments: Pt limited with LE adls but has been for some time now. Uses adaptive equipment and tasks his time.     Vision Baseline Vision/History: 1 Wears glasses;4 Cataracts Ability to See in Adequate Light: 0 Adequate Patient Visual Report: No change from baseline Vision Assessment?: No apparent visual deficits     Perception     Praxis      Pertinent Vitals/Pain Pain Assessment Pain Assessment: No/denies  pain     Hand Dominance Right   Extremity/Trunk Assessment Upper Extremity Assessment Upper Extremity Assessment: Overall WFL for tasks assessed   Lower Extremity Assessment Lower Extremity Assessment: Defer to PT evaluation   Cervical / Trunk Assessment Cervical / Trunk Assessment: Normal   Communication Communication Communication: No difficulties    Cognition Arousal/Alertness: Awake/alert Behavior During Therapy: WFL for tasks assessed/performed Overall Cognitive Status: Within Functional Limits for tasks assessed                                       General Comments  Pt has O2 available at home    Exercises     Shoulder Instructions      Home Living Family/patient expects to be discharged to:: Private residence Living Arrangements: Children Available Help at Discharge: Available PRN/intermittently Type of Home: House Home Access: Stairs to enter [employer] of Steps: 4 Entrance Stairs-Rails: Right;Left Home Layout: One level     Bathroom Shower/Tub: Producer, television/film/video: Standard     Home Equipment: BSC/3in1;Cane - single point;Rollator (4 wheels);Electric scooter          Prior Functioning/Environment Prior Level of Function : Independent/Modified Independent;Driving;History of Falls (last six months)             Mobility Comments: uses rollator and scooter outside.  Inside uses rollator or nothing ADLs Comments: son cleans. Eats a lot of fast food. Needs help with socks so just does not wear them.        OT Problem List: Decreased activity tolerance;Decreased knowledge of use of DME or AE;Cardiopulmonary status limiting activity      OT Treatment/Interventions: Self-care/ADL training;Energy conservation;Therapeutic activities    OT Goals(Current goals can be found in the care plan section) Acute Rehab OT Goals Patient Stated Goal: to get home OT Goal Formulation: With patient Time For Goal Achievement: 05/10/22 Potential to Achieve Goals: Good ADL Goals Pt Will Perform Lower Body Dressing: with supervision;with adaptive equipment Additional ADL Goal #1: Pt will walk to bathroom with rollator and complete all toileting with mod I Additional ADL Goal #2: Pt will state 3 things he can do at home to manage his activity tolerance in setting of CHF and COPD with  no cues.  OT Frequency: Min 2X/week    Co-evaluation PT/OT/SLP Co-Evaluation/Treatment: Yes Reason for Co-Treatment: Complexity of the patient's impairments (multi-system involvement) PT goals addressed during session: Mobility/safety with mobility OT goals addressed during session: ADL's and self-care      AM-PAC OT "6 Clicks" Daily Activity     Outcome Measure Help from another person eating meals?: None Help from another person taking care of personal grooming?: None Help from another person toileting, which includes using toliet, bedpan, or urinal?: None Help from another person bathing (including washing, rinsing, drying)?: A Little Help from another person to put on and taking off regular upper body clothing?: None Help from another person to put on and taking off regular lower body clothing?: A Little 6 Click Score: 22   End of Session Equipment Utilized During Treatment: Rollator (4 wheels);Oxygen Nurse Communication: Mobility status  Activity Tolerance: Patient limited by fatigue Patient left: in chair;with call bell/phone within reach  OT Visit Diagnosis: Unsteadiness on feet (R26.81)                Time: 7829-5621 OT Time Calculation (min): 30 min Charges:  OT General  Charges $OT Visit: 1 Visit OT Evaluation $OT Eval Moderate Complexity: 1 Mod  Hope Budds 04/26/2022, 10:03 AM

## 2022-04-27 DIAGNOSIS — I480 Paroxysmal atrial fibrillation: Secondary | ICD-10-CM | POA: Diagnosis not present

## 2022-04-27 DIAGNOSIS — J441 Chronic obstructive pulmonary disease with (acute) exacerbation: Secondary | ICD-10-CM | POA: Diagnosis not present

## 2022-04-27 DIAGNOSIS — I5033 Acute on chronic diastolic (congestive) heart failure: Secondary | ICD-10-CM | POA: Diagnosis not present

## 2022-04-27 DIAGNOSIS — I10 Essential (primary) hypertension: Secondary | ICD-10-CM | POA: Diagnosis not present

## 2022-04-27 LAB — BASIC METABOLIC PANEL
Anion gap: 10 (ref 5–15)
BUN: 31 mg/dL — ABNORMAL HIGH (ref 8–23)
CO2: 25 mmol/L (ref 22–32)
Calcium: 8.8 mg/dL — ABNORMAL LOW (ref 8.9–10.3)
Chloride: 96 mmol/L — ABNORMAL LOW (ref 98–111)
Creatinine, Ser: 1.11 mg/dL (ref 0.61–1.24)
GFR, Estimated: >60 mL/min (ref >60)
Glucose, Bld: 123 mg/dL — ABNORMAL HIGH (ref 70–99)
Potassium: 4.5 mmol/L (ref 3.5–5.1)
Sodium: 131 mmol/L — ABNORMAL LOW (ref 135–145)

## 2022-04-27 MED ORDER — PANTOPRAZOLE SODIUM 40 MG PO TBEC
40.0000 mg | DELAYED_RELEASE_TABLET | Freq: Two times a day (BID) | ORAL | Status: DC
  Administered 2022-04-27 – 2022-04-30 (×7): 40 mg via ORAL
  Filled 2022-04-27 (×7): qty 1

## 2022-04-27 MED ORDER — PANTOPRAZOLE SODIUM 40 MG PO TBEC: 40.0000 mg | DELAYED_RELEASE_TABLET | Freq: Every day | ORAL | Status: DC

## 2022-04-27 NOTE — Telephone Encounter (Signed)
I was unable to leave a message on Darrells home number due to the VM being full

## 2022-04-27 NOTE — Progress Notes (Signed)
Initial Nutrition Assessment  DOCUMENTATION CODES:   Not applicable  INTERVENTION:  Encourage adequate PO intake with emphasis on protein with all meals and snacks Ensure Enlive po once daily, each supplement provides 350 kcal and 20 grams of protein.  NUTRITION DIAGNOSIS:   Increased nutrient needs related to chronic illness as evidenced by estimated needs.  GOAL:   Patient will meet greater than or equal to 90% of their needs  MONITOR:   PO intake, Supplement acceptance, Labs, Weight trends, I & O's  REASON FOR ASSESSMENT:   Consult Assessment of nutrition requirement/status  ASSESSMENT:   Pt admitted with SOB d/t acute on chronic diastolic HF. PMH significant for chronic diastolic heart failure, paroxysmal afib, COPD, essential HTN, HLD, acquired hypothyroidism, chronic hyponatremia.  Pt sitting up in chair at time of visit. He states that only during admission, he has been experiencing reflux which had initially affected his appetite but states that this is now improving. Aside from reported reflux, pt denies difficulty swallowing. He is able to order foods that he can chew well despite poor dentition.   He lives at home with his son although states that they don't enjoy each others cooking so they eat out often. Depending on his energy, he may prepare eggs and sausage for breakfast. Lunch he may have an apple and dinner is often picked up or eaten out at places such as [employer].   His beverage intake includes V8 juice, gatorade, water and sometimes beer.   He does not drink nutrition supplements or take any vitamins at home. Given increased nutrition needs r/t COPD and multiple other comorbidities, encouraged pt to add a protein supplement to daily nutritional intake.   Meal completions: 4/7: 20% breakfast, 80% dinner 4/8: 100% x3 recorded meals  Pt states that his weight has remained stable around 211 lbs and denies significant recent changes.   Reviewed weight  history. Pt's weight within the last 6 months has been widely variable.  Admit weight: 97.1 kg Current weight: 95.8 kg  Medications: jardiance, solu-medrol, carafate  Labs: sodium 131, BUN 31  UOP: x24 hours I/O's: - since admit  NUTRITION - FOCUSED PHYSICAL EXAM:  Flowsheet Row Most Recent Value  Orbital Region No depletion  Upper Arm Region No depletion  Thoracic and Lumbar Region No depletion  Buccal Region No depletion  Temple Region No depletion  Clavicle Bone Region No depletion  Clavicle and Acromion Bone Region No depletion  Scapular Bone Region No depletion  Dorsal Hand Mild depletion  Patellar Region No depletion  Anterior Thigh Region No depletion  Posterior Calf Region No depletion  Edema (RD Assessment) None  Hair Reviewed  Eyes Reviewed  Mouth Other (Comment)  [poor dentition]  Skin Reviewed  Nails Reviewed       Diet Order:   Diet Order             Diet regular Fluid consistency: Thin; Fluid restriction: 1500 mL Fluid  Diet effective now                   EDUCATION NEEDS:   Education needs have been addressed  Skin:  Skin Assessment: Reviewed RN Assessment  Last BM:  unknown  Height:   Ht Readings from Last 1 Encounters:  04/25/22 5\' 9"  (1.753 m)    Weight:   Wt Readings from Last 1 Encounters:  04/27/22 95.8 kg    Ideal Body Weight:  72.7 kg  BMI:  Body mass index is 31.19 kg/m.  Estimated Nutritional Needs:   Kcal:  1800-2000  Protein:  95-110g  Fluid:  >/=1.8L  Drusilla Kanner, RDN, LDN Clinical Nutrition

## 2022-04-27 NOTE — Telephone Encounter (Signed)
Admitted into [facility].ok per dr. cox for this date/time i left a message on [name]'s VM with the appt date/time/arrival time i asked that he call the office back to let us know he got the [facility]

## 2022-04-27 NOTE — Assessment & Plan Note (Signed)
Continue with sucralfate and will add bid pantoprazole due to severe reflux symptoms.

## 2022-04-27 NOTE — Consult Note (Signed)
   [facility] CM Inpatient Consult   04/27/2022  Kyle Farley [birth year] [id number]  [facility] [[facility]]  Accountable Care Organization [ACO] Patient: Kyle Farley   Primary Care Provider:  [name] is noted and confirmed with [facility] RN CC and patient confirms the same   Patient is currently active with [facility] [[facility]] Care Management for chronic care management services.  Patient has been engaged by a [facility] RN for Chronic Care Managment.  Our community based plan of care has focused on disease management and community resource support.    Patient will receive a post hospital call and will be evaluated for assessments and disease process education.    Met with the patient at the bedside sitting up in recliner and he states his PCP for many years had moved to [facility], so he goes to [facility] for any regular visit.  Explained post hospital follow up and state no current new needs.  He drives [but no much anymore] and has no other SDOH issues. Patient given an appointment reminder card with a 24 hour nurse advise line number.  Patient verbalized understanding for post hospital follow up calls.   Plan: Continue to follow for disposition needs for [place]  community chronic  care management.  Of note, [facility] Care Management services does not replace or interfere with any services that are needed or arranged by inpatient [facility] care management team.   For additional questions or referrals please contact:  [name], RN BSN CCM [facility]  [phone] business mobile phone Toll free office [phone]  *Concierge Line  [phone] Fax number: [phone] [email or web address]

## 2022-04-27 NOTE — Progress Notes (Signed)
Mobility Specialist Progress Note:   04/27/22 0900  Mobility  Activity Ambulated with assistance in hallway  Level of Assistance Standby assist, set-up cues, supervision of patient - no hands on  Assistive Device Four wheel walker  Distance Ambulated (ft) 150 ft  Activity Response Tolerated fair  Mobility Referral Yes  $Mobility charge 1 Mobility   Pt agreeable to mobility session. Required no physical assist, x1 seated break d/t fatigue. Pt desat to low 80s on 3LO2, requiring 6LO2 to recover to >90%. Pt back in chair with all needs met, back on 3LO2, SpO2 >90%.   Addison Lank Mobility Specialist Please contact via SecureChat or  Rehab office at [phone]

## 2022-04-27 NOTE — Progress Notes (Addendum)
Progress Note   Patient: Kyle Farley BBC:[id number] DOB: 1941-01-11 DOA: 04/24/2022     2 DOS: the patient was seen and examined on 04/27/2022   Brief hospital course: Kyle Farley was admitted to the hospital with the working diagnosis of heart failure exacerbation.   82 yo male with the past medical history of heart failure, paroxysmal atrial fibrillation, COPD, and dyslipidemia who presented with dyspnea. Reported one week of progressive dyspnea, associated with orthopnea, and worsening lower extremity edema. Positive wheezing and non productive cough. On his initial physical examination his blood pressure was 168/67, HR 53, RR 23 and 02 saturation 99%, lungs with no wheezing or rales, heart with S2 and S2 present and regular with no gallops, or murmurs, abdomen with no distention and positive lower extremity edema.   Na 126, K 4,4 Cl 91, bicarbonate 21, glucose 105, bun 9  cr 1,0 Mag 1,7  BNP 409 High sensitive troponin 23 and 22 Wbc 10,7 hgb 11,3 plt 218   Chest radiograph with cardiomegaly and bilateral hilar vascular congestion, positive right base atelectasis and hyperinflation.   #1 EKG 38 bpm, normal axis, right bundle branch block, junctional rhythm with no significant ST segment or T wave changes.  #2 EKG 54 bpm, normal axis, right bundle branch block, atrial rhythm with no significant ST segment or T wave changes.   Patient was placed on furosemide for diuresis.   04/08 patient with improvement in volume status, continue to have dyspnea due to COPD exacerbation.  04/09 clinically improving, possible dc home in 48 hrs.   Assessment and Plan: * Acute on chronic diastolic heart failure Echocardiogram with preserved LV systolic function with EF 60 to 65%, no wall motion abnormalities, mild LVH, RV with moderate dilatation, RV systolic function low normal, LA and RA with moderate dilatation, mild to moderate aortic stenosis.   Documented urine output is 1,850  cc Systolic blood  pressure is 130 to 146 mmHg.   Plan to continue with metoprolol, losartan, SGLT 2 inh and spironolactone. Continue to hold on loop diuretic for now.   COPD exacerbation Dyspnea continue improving  Chronic hypoxemic respirator failure (patient on home 02) Ongoing tobacco abuse.   Plan to continue systemic corticosteroids.  Bronchodilator therapy, inhaled corticosteroids and airway clearing techniques.  Antitussive agents.   Smoking cessation counseling.   Paroxysmal atrial fibrillation Continue with low dose metoprolol and amiodarone 200 mg po daily. Anticoagulation with apixaban.  Continue telemetry monitoring.   Essential hypertension Continue blood pressure control with metoprolol and losartan.  Hyponatremia Renal function with serum cr at 1,11 with K at 4,5 and serum bicarbonate at 25. Na 131 and Mg 2,4  Plan to continue diuresis with spironolactone.  At home taking torsemide as needed 20 mg bid.  Follow up renal function in am.   Anemia of chronic disease Hgb stable.  Continue to follow up cell count.   Acquired hypothyroidism Continue levothyroxine.  Hyperlipidemia Continue with atorvastatin.   Class 1 obesity Calculated BMI 32.7   GERD (gastroesophageal reflux disease) Continue with sucralfate and will add bid pantoprazole due to severe reflux symptoms.    Subjective: Patient with improvement in dyspnea, no chest pain, no lower extremity edema, he complains of reflux.   Physical Exam: Vitals:   04/26/22 1952 04/27/22 0039 04/27/22 0723 04/27/22 1126  BP: (!) 114/47 (!) 124/54 (!) 146/63 (!) 130/50  Pulse: 64 (!) 51 64 (!) 51  Resp: 20 15 16 15   Temp: 98.1 F (36.7 C) 98  F (36.7 C) 98.1 F (36.7 C) 98.1 F (36.7 C)  TempSrc: Oral Oral Oral Oral  SpO2: 93% (!) 88% 95% 92%  Weight:  95.8 kg    Height:       Neurology awake and alert ENT with mild pallor Cardiovascular with S1 and S2 present and rhythmic with no gallops, rubs or  murmurs Respiratory with prolonged expiratory phase with improved rhonchi, with no rales Abdomen with no distention  Trace lower extremity edema  Data Reviewed:    Family Communication: no family at the bedside   Disposition: Status is: Inpatient Remains inpatient appropriate because: heart and COPD exacerbation   Planned Discharge Destination: Home    Author: Coralie Keens, MD 04/27/2022 12:57 PM  For on call review [email or web address].

## 2022-04-27 NOTE — Progress Notes (Signed)
PROGRESS NOTE    Kyle RossDarrell L Farley  ZOX:[id number] DOB: 05/28/1940 DOA: 04/24/2022 PCP: Kyle MorningHeaton, Kyle J, NP (Inactive)  81/M with history of chronic diastolic CHF, COPD, paroxysmal A-fib, hypertension, dyslipidemia, hypothyroidism presented to the ED 4/6 with progressive shortness of breath, orthopnea and chest pain.  In the ED was bradycardic in the 30s on arrival, -Lower extremity edema, chest x-ray noted mild pulmonary vascular congestion. Echocardiogram 4/7 noted EF of 60-65% with mild LVH and grade 2 diastolic dysfunction -Improving on diuretics as well as treatment for COPD exacerbation  Subjective: Feels better overall, breathing improving, not back to baseline  Assessment and Plan:  Acute on chronic diastolic heart failure -Echo with EF 60 to 65%,mild LVH, RV -low normal, mild to moderate aortic stenosis.  -Diuresed well with IV Lasix he is 8.8 L negative -Transition to oral torsemide, continue metoprolol losartan Jardiance and Aldactone  COPD exacerbation -Still with expiratory wheezing, continue IV steroids 1 more day, DuoNebs, flutter valve, incentive spirometry -Smoking cessation counseling.   Paroxysmal atrial fibrillation Continue metoprolol, amiodarone and apixaban  Essential hypertension -Stable continue metoprolol, losartan  Acquired hypothyroidism Continue levothyroxine.  Anemia of chronic disease Hgb stable.   Hyperlipidemia Continue with atorvastatin.   Class 1 obesity Calculated BMI 32.7   Hyponatremia -Improved, monitor   DVT prophylaxis: Apixaban Code Status: Discussed CODE STATUS with the patient, he would like to have a natural death, DNR Family Communication: None present Disposition Plan: Home in 1 to 2 days  Consultants:    Procedures:   Antimicrobials:    Objective: Vitals:   04/26/22 1952 04/27/22 0039 04/27/22 0723 04/27/22 1126  BP: (!) 114/47 (!) 124/54 (!) 146/63 (!) 130/50  Pulse: 64 (!) 51 64 (!) 51  Resp: 20 15 16 15    Temp: 98.1 F (36.7 C) 98 F (36.7 C) 98.1 F (36.7 C) 98.1 F (36.7 C)  TempSrc: Oral Oral Oral Oral  SpO2: 93% (!) 88% 95% 92%  Weight:  95.8 kg    Height:        Intake/Output Summary (Last 24 hours) at 04/27/2022 1217 Last data filed at 04/27/2022 0900 Gross per 24 hour  Intake 480 ml  Output 2050 ml  Net -1570 ml   Filed Weights   04/25/22 0202 04/26/22 0455 04/27/22 0039  Weight: 100.7 kg 95.8 kg 95.8 kg    Examination:      Data Reviewed:   CBC: Recent Labs  Lab 04/24/22 2256 04/25/22 0638  WBC 10.7* 16.1*  NEUTROABS  --  15.3*  HGB 11.3* 12.6*  HCT 35.3* 39.3  MCV 88.9 88.1  PLT 218 261   Basic Metabolic Panel: Recent Labs  Lab 04/24/22 2256 04/25/22 0638 04/26/22 0044 04/27/22 0612  NA 126* 130* 126* 131*  K 4.4 3.8 4.1 4.5  CL 91* 91* 92* 96*  CO2 21* 27 26 25   GLUCOSE 105* 146* 151* 123*  BUN 9 11 24* 31*  CREATININE 1.01 0.99 1.22 1.11  CALCIUM 9.1 9.2 8.7* 8.8*  MG 1.7 2.1 2.4  --   PHOS  --  4.3  --   --    GFR: Estimated Creatinine Clearance: 60.6 mL/min (by C-G formula based on SCr of 1.11 mg/dL). Liver Function Tests: Recent Labs  Lab 04/25/22 0638  AST 27  ALT 27  ALKPHOS 85  BILITOT 0.6  PROT 7.6  ALBUMIN 3.8   No results for input(s): "LIPASE", "AMYLASE" in the last 168 hours. No results for input(s): "AMMONIA" in the last 168 hours.  Coagulation Profile: No results for input(s): "INR", "PROTIME" in the last 168 hours. Cardiac Enzymes: No results for input(s): "CKTOTAL", "CKMB", "CKMBINDEX", "TROPONINI" in the last 168 hours. BNP (last 3 results) Recent Labs    12/22/21 1149 01/20/22 0000 01/28/22 1059  PROBNP 639* CANCELED 379   HbA1C: No results for input(s): "HGBA1C" in the last 72 hours. CBG: No results for input(s): "GLUCAP" in the last 168 hours. Lipid Profile: No results for input(s): "CHOL", "HDL", "LDLCALC", "TRIG", "CHOLHDL", "LDLDIRECT" in the last 72 hours. Thyroid Function Tests: No results  for input(s): "TSH", "T4TOTAL", "FREET4", "T3FREE", "THYROIDAB" in the last 72 hours. Anemia Panel: No results for input(s): "VITAMINB12", "FOLATE", "FERRITIN", "TIBC", "IRON", "RETICCTPCT" in the last 72 hours. Urine analysis: No results found for: "COLORURINE", "APPEARANCEUR", "LABSPEC", "PHURINE", "GLUCOSEU", "HGBUR", "BILIRUBINUR", "KETONESUR", "PROTEINUR", "UROBILINOGEN", "NITRITE", "LEUKOCYTESUR" Sepsis Labs: @LABRCNTIP (procalcitonin:4,lacticidven:4)  )No results found for this or any previous visit (from the past 240 hour(s)).   Radiology Studies: ECHOCARDIOGRAM COMPLETE  Result Date: 04/25/2022    ECHOCARDIOGRAM REPORT   Patient Name:   Kyle Farley Date of Exam: 04/25/2022 Medical Rec #:  [id number]       Height:       69.0 in Accession #:    [id number]      Weight:       222.0 lb Date of Birth:  01-28-1940        BSA:          2.160 m Patient Age:    81 years        BP:           124/93 mmHg Patient Gender: M               HR:           56 bpm. Exam Location:  Inpatient Procedure: 2D Echo, Color Doppler, Cardiac Doppler and Intracardiac            Opacification Agent Indications:     I50.31 Acute diastolic (congestive) heart failure  History:         Patient has prior history of Echocardiogram examinations, most                  recent 06/19/2021. CHF, COPD, Arrythmias:Atrial Fibrillation;                  Risk Factors:Hypertension and Dyslipidemia.  Sonographer:     Irving Burton Senior RDCS Referring Phys:  Angie Fava Diagnosing Phys: Thurmon Fair MD  Sonographer Comments: Technically difficult due to lung interference. IMPRESSIONS  1. Left ventricular ejection fraction, by estimation, is 60 to 65%. The left ventricle has normal function. The left ventricle has no regional wall motion abnormalities. There is mild concentric left ventricular hypertrophy. Left ventricular diastolic parameters are consistent with Grade II diastolic dysfunction (pseudonormalization).  2. Right ventricular systolic  function is low normal. The right ventricular size is moderately enlarged.  3. Left atrial size was moderately dilated.  4. Right atrial size was moderately dilated.  5. The mitral valve is degenerative. Mild mitral valve regurgitation.  6. The aortic valve is calcified. There is mild calcification of the aortic valve. There is moderate thickening of the aortic valve. Aortic valve regurgitation is trivial. Mild to moderate aortic valve stenosis. Aortic valve mean gradient measures 17.0 mmHg. Aortic valve Vmax measures 2.82 m/s.  7. The inferior vena cava is normal in size with greater than 50% respiratory variability, suggesting right atrial pressure of 3 mmHg. Comparison(s): Prior  images reviewed side by side. The left ventricular function has improved. Aortic stenosis is now present. FINDINGS  Left Ventricle: Left ventricular ejection fraction, by estimation, is 60 to 65%. The left ventricle has normal function. The left ventricle has no regional wall motion abnormalities. Definity contrast agent was given IV to delineate the left ventricular  endocardial borders. The left ventricular internal cavity size was normal in size. There is mild concentric left ventricular hypertrophy. Left ventricular diastolic parameters are consistent with Grade II diastolic dysfunction (pseudonormalization). Right Ventricle: The right ventricular size is moderately enlarged. Right vetricular wall thickness was not well visualized. Right ventricular systolic function is low normal. Left Atrium: Left atrial size was moderately dilated. Right Atrium: Right atrial size was moderately dilated. Pericardium: There is no evidence of pericardial effusion. Presence of epicardial fat layer. Mitral Valve: The mitral valve is degenerative in appearance. Mild mitral annular calcification. Mild mitral valve regurgitation. Tricuspid Valve: The tricuspid valve is grossly normal. Tricuspid valve regurgitation is trivial. Aortic Valve: The aortic valve  is calcified. There is mild calcification of the aortic valve. There is moderate thickening of the aortic valve. Aortic valve regurgitation is trivial. Mild to moderate aortic stenosis is present. Aortic valve mean gradient  measures 17.0 mmHg. Aortic valve peak gradient measures 31.8 mmHg. Aortic valve area, by VTI measures 1.53 cm. Pulmonic Valve: The pulmonic valve was grossly normal. Pulmonic valve regurgitation is not visualized. Aorta: The aortic root and ascending aorta are structurally normal, with no evidence of dilitation. Venous: The inferior vena cava is normal in size with greater than 50% respiratory variability, suggesting right atrial pressure of 3 mmHg. IAS/Shunts: No atrial level shunt detected by color flow Doppler.  LEFT VENTRICLE PLAX 2D LVIDd:         4.00 cm   Diastology LVIDs:         2.60 cm   LV e' medial:    6.53 cm/s LV PW:         1.30 cm   LV E/e' medial:  12.5 LV IVS:        1.10 cm   LV e' lateral:   7.83 cm/s LVOT diam:     2.30 cm   LV E/e' lateral: 10.4 LV SV:         107 LV SV Index:   50 LVOT Area:     4.15 cm  RIGHT VENTRICLE RV S prime:     11.10 cm/s TAPSE (M-mode): 2.3 cm LEFT ATRIUM             Index        RIGHT ATRIUM           Index LA diam:        3.80 cm 1.76 cm/m   RA Area:     28.00 cm LA Vol (A2C):   74.9 ml 34.68 ml/m  RA Volume:   94.80 ml  43.89 ml/m LA Vol (A4C):   88.9 ml 41.16 ml/m LA Biplane Vol: 92.3 ml 42.74 ml/m  AORTIC VALVE AV Area (Vmax):    1.89 cm AV Area (Vmean):   1.63 cm AV Area (VTI):     1.53 cm AV Vmax:           282.00 cm/s AV Vmean:          195.000 cm/s AV VTI:            0.699 m AV Peak Grad:      31.8 mmHg AV Mean Grad:  17.0 mmHg LVOT Vmax:         128.00 cm/s LVOT Vmean:        76.600 cm/s LVOT VTI:          0.258 m LVOT/AV VTI ratio: 0.37  AORTA Ao Root diam: 3.30 cm Ao Asc diam:  3.30 cm MITRAL VALVE MV Area (PHT): 3.21 cm    SHUNTS MV Decel Time: 236 msec    Systemic VTI:  0.26 m MV E velocity: 81.70 cm/s  Systemic Diam:  2.30 cm MV A velocity: 84.90 cm/s MV E/A ratio:  0.96 Mihai Croitoru MD Electronically signed by Thurmon Fair MD Signature Date/Time: 04/25/2022/3:27:20 PM    Final (Updated)      Scheduled Meds:  amiodarone  200 mg Oral BID   apixaban  5 mg Oral BID   atorvastatin  40 mg Oral Daily   benzonatate  200 mg Oral TID   empagliflozin  10 mg Oral Daily   fluticasone furoate-vilanterol  1 puff Inhalation Daily   ipratropium-albuterol  3 mL Nebulization Q6H   levothyroxine  75 mcg Oral Daily   losartan  25 mg Oral Daily   methylPREDNISolone (SOLU-MEDROL) injection  40 mg Intravenous Daily   metoprolol succinate  12.5 mg Oral Daily   montelukast  10 mg Oral Q1200   spironolactone  12.5 mg Oral Daily   sucralfate  1 g Oral TID WC & HS   tamsulosin  0.4 mg Oral Daily   traZODone  50 mg Oral QHS   Continuous Infusions:   LOS: 2 days    Time spent:    Zannie Cove, MD [facility]   04/27/2022, 12:17 PM

## 2022-04-27 NOTE — Progress Notes (Signed)
   Heart Failure Stewardship Pharmacist Progress Note   PCP: Janie Morning, NP (Inactive) PCP-Cardiologist: Norman Herrlich, MD    HPI:  82 yo M with PMH of CHF, afib, COPD, HTN, HLD, and hypothyroidism.  Presented to the ED on 4/6 with shortness of breath, orthopnea, and chest pain. Found to bradycardic in the 30s on arrival. Chronic LE edema. CXR with mild central vascular congestion without focal edema. ECHO 4/7 showed LVEF 60-65% (was 40-45% in 2022), no regional wall motion abnormalities, mild concentric LVH, and G2DD.   Current HF Medications: Beta Blocker: metoprolol XL 12.5 mg daily ACE/ARB/ARNI: losartan 25 mg daily MRA: spironolactone 12.5 mg daily SGLT2i: Jardiance 10 mg daily  Prior to admission HF Medications: Diuretic: torsemide 20 mg BID Beta blocker: metoprolol XL 50 mg daily ACE/ARB/ARNI: lisinopril 20 mg daily  Pertinent Lab Values: Serum creatinine 1.11, BUN 31, Potassium 4.5, Sodium 131, BNP 409.6, Magnesium 2.4  Vital Signs: Weight: 211 lbs (admission weight: 222 lbs) Blood pressure: 120-140/60s  Heart rate: 50s  I/O: -1L yesterday; net -7.4L  Medication Assistance / Insurance Benefits Check: Does the patient have prescription insurance?  Yes Type of insurance plan: [employer]na Medicare  Outpatient Pharmacy:  Prior to admission outpatient pharmacy: [facility] Is the patient willing to use [facility] TOC pharmacy at discharge? Yes Is the patient willing to transition their outpatient pharmacy to utilize a [facility] outpatient pharmacy?   Yes - transitioned to [facility] mail order    Assessment: 1. Acute on chronic diastolic CHF (LVEF 60-65%). NYHA class II symptoms. - Off IV lasix, shortness of breath thought to be due to COPD exacerbation.  - Metoprolol XL 12.5 mg daily ordered - was not able to get dose on 4/8. May need to stop for discharge. - Continue losartan 25 mg daily - Continue spironolactone 12.5 mg daily  - Continue Jardiance 10 mg daily   Plan: 1)  Medication changes recommended at this time: - Continue current regimen - May need to stop metoprolol  2) Patient assistance: Sherryll Burger copay $47 - Farxiga/Jardiance copay $47 - Copays affordable per patient  3)  Education  - Patient has been educated on current HF medications and potential additions to HF medication regimen - Patient verbalizes understanding that over the next few months, these medication doses may change and more medications may be added to optimize HF regimen - Patient has been educated on basic disease state pathophysiology and goals of therapy   Sharen Hones, PharmD, BCPS Heart Failure Stewardship Pharmacist Phone [phone]

## 2022-04-28 DIAGNOSIS — I5033 Acute on chronic diastolic (congestive) heart failure: Secondary | ICD-10-CM | POA: Diagnosis not present

## 2022-04-28 LAB — BASIC METABOLIC PANEL
Anion gap: 6 (ref 5–15)
BUN: 31 mg/dL — ABNORMAL HIGH (ref 8–23)
CO2: 26 mmol/L (ref 22–32)
Calcium: 8.6 mg/dL — ABNORMAL LOW (ref 8.9–10.3)
Chloride: 100 mmol/L (ref 98–111)
Creatinine, Ser: 1.23 mg/dL (ref 0.61–1.24)
GFR, Estimated: 59 mL/min — ABNORMAL LOW (ref >60)
Glucose, Bld: 154 mg/dL — ABNORMAL HIGH (ref 70–99)
Potassium: 4.6 mmol/L (ref 3.5–5.1)
Sodium: 132 mmol/L — ABNORMAL LOW (ref 135–145)

## 2022-04-28 MED ORDER — ENSURE ENLIVE PO LIQD
237.0000 mL | ORAL | Status: DC
  Administered 2022-04-28 – 2022-04-30 (×3): 237 mL via ORAL

## 2022-04-28 MED ORDER — IPRATROPIUM-ALBUTEROL 0.5-2.5 (3) MG/3ML IN SOLN
3.0000 mL | Freq: Three times a day (TID) | RESPIRATORY_TRACT | Status: DC
  Administered 2022-04-28 – 2022-04-30 (×6): 3 mL via RESPIRATORY_TRACT
  Filled 2022-04-28 (×7): qty 3

## 2022-04-28 MED ORDER — TORSEMIDE 20 MG PO TABS
20.0000 mg | ORAL_TABLET | Freq: Two times a day (BID) | ORAL | Status: DC
  Administered 2022-04-28 (×2): 20 mg via ORAL
  Filled 2022-04-28 (×2): qty 1

## 2022-04-28 NOTE — Care Management Important Message (Signed)
Important Message  Patient Details  Name: Kyle Farley MRN: [id number] Date of Birth: Apr 18, 1940   Medicare Important Message Given:  Yes     Renie Ora 04/28/2022, 12:17 PM

## 2022-04-28 NOTE — Progress Notes (Signed)
Physical Therapy Treatment Patient Details Name: Kyle Farley MRN: [id number] DOB: 1940-02-06 Today's Date: 04/28/2022   History of Present Illness Pt is a 82 yo male admitted with dyspena, LE edema. Pt with CHF an COPD exacerbation.  PHM: CHF, COPD, afib    PT Comments    Pt is slowly progressing towards goals. Pt reports that previously he did not ambulate during the day with O2 but currently pt most likely requires oxygen during functional activity with O2 sats dropping to 75% on Room air with 150 ft of gait and 78% on 3L with stairs and another 150 ft of gait. Pt is Mod I to supervision level for all functional activities. Due to pt current functional status, home set up and available assist no current skilled physical therapy services recommended. Pt will continue to be followed in the acute care setting at this time at a lower frequency in order to ensure safe mobility on discharge to home environment once medically stable.     Recommendations for follow up therapy are one component of a multi-disciplinary discharge planning process, led by the attending physician.  Recommendations may be updated based on patient status, additional functional criteria and insurance authorization.  Follow Up Recommendations       Assistance Recommended at Discharge PRN  Patient can return home with the following A little help with walking and/or transfers;Assistance with cooking/housework;Assist for transportation;Help with stairs or ramp for entrance   Equipment Recommendations  None recommended by PT    Recommendations for Other Services       Precautions / Restrictions Precautions Precautions: Fall Precaution Comments: Pt has had one fall in last 6 months Restrictions Weight Bearing Restrictions: No     Mobility  Bed Mobility     General bed mobility comments: in chair on arrival and sleeps in recliner at home as well. Patient Response: Cooperative  Transfers Overall transfer  level: Modified independent Equipment used: Rollator (4 wheels) Transfers: Sit to/from Stand Sit to Stand: Modified independent (Device/Increase time)           General transfer comment: safe with all transfers. 1x seated rest break    Ambulation/Gait Ambulation/Gait assistance: Modified independent (Device/Increase time) Gait Distance (Feet): 300 Feet Assistive device: Rollator (4 wheels) Gait Pattern/deviations: Decreased stride length, Step-through pattern, Trunk flexed Gait velocity: decreased cadence Gait velocity interpretation: 1.31 - 2.62 ft/sec, indicative of limited community ambulator   General Gait Details: no LOB noted. Pt required 1 seated rest break on room air secondary to pt stating he does not wear O2 during the day at home O2 dropping down to 75% O2 sats. Pt turned up to 6L and recovered in 2 min or less. Back down to 3L and completed ambulation on 3L.   Stairs Stairs: Yes Stairs assistance: Supervision Stair Management: One rail Right, Step to pattern, Sideways Number of Stairs: 5 General stair comments: Pt was able to navigate stairs per home set up.       Balance Overall balance assessment: Mild deficits observed, not formally tested          Cognition Arousal/Alertness: Awake/alert Behavior During Therapy: WFL for tasks assessed/performed Overall Cognitive Status: Within Functional Limits for tasks assessed             General Comments General comments (skin integrity, edema, etc.): Pt states he only wears o2 at night at home. Attempted ambulation on room air and pt O2 sats dropped down to 75% taking 2 min to recover on 6L. Pt  O2 sats dropped after stairs an another 150 ft of gait to 78% O2 sats on 3L taking ~5 min in order to get back into the low 90's.      Pertinent Vitals/Pain Pain Assessment Pain Assessment: No/denies pain     PT Goals (current goals can now be found in the care plan section) Acute Rehab PT Goals Patient Stated  Goal: to go home PT Goal Formulation: With patient Time For Goal Achievement: 05/10/22 Potential to Achieve Goals: Good Progress towards PT goals: Progressing toward goals    Frequency    Min 1X/week      PT Plan Current plan remains appropriate       AM-PAC PT "6 Clicks" Mobility   Outcome Measure  Help needed turning from your back to your side while in a flat bed without using bedrails?: A Little Help needed moving from lying on your back to sitting on the side of a flat bed without using bedrails?: A Little Help needed moving to and from a bed to a chair (including a wheelchair)?: None Help needed standing up from a chair using your arms (e.g., wheelchair or bedside chair)?: None Help needed to walk in hospital room?: None Help needed climbing 3-5 steps with a railing? : A Little 6 Click Score: 21    End of Session Equipment Utilized During Treatment: Gait belt;Oxygen Activity Tolerance: Patient tolerated treatment well Patient left: in chair;with call bell/phone within reach;with chair alarm set Nurse Communication: Mobility status PT Visit Diagnosis: Other abnormalities of gait and mobility (R26.89)     Time: 9323-5573 PT Time Calculation (min) (ACUTE ONLY): 23 min  Charges:  $Gait Training: 8-22 mins $Therapeutic Activity: 8-22 mins                    Harrel Carina, DPT, CLT  Acute Rehabilitation Services Office: [phone] (Secure chat preferred)    Kyle Farley 04/28/2022, 12:42 PM

## 2022-04-28 NOTE — Progress Notes (Signed)
Mobility Specialist Progress Note:   04/28/22 0900  Mobility  Activity Ambulated with assistance in hallway  Level of Assistance Standby assist, set-up cues, supervision of patient - no hands on  Assistive Device Four wheel walker  Distance Ambulated (ft) 150 ft  Activity Response Tolerated well  Mobility Referral Yes  $Mobility charge 1 Mobility   Pt eager for mobility session. Required no physical assistance, x1 seated rest break d/t fatigue. SpO2 with unreliable reading however when came up on screen, was [facility] on 3-4LO2. Pt back in recliner with all needs met.   Addison Lank Mobility Specialist Please contact via SecureChat or  Rehab office at [phone]

## 2022-04-28 NOTE — Progress Notes (Signed)
   Heart Failure Stewardship Pharmacist Progress Note   PCP: Janie Morning, NP (Inactive) PCP-Cardiologist: Norman Herrlich, MD    HPI:  82 yo M with PMH of CHF, afib, COPD, HTN, HLD, and hypothyroidism.  Presented to the ED on 4/6 with shortness of breath, orthopnea, and chest pain. Found to bradycardic in the 30s on arrival. Chronic LE edema. CXR with mild central vascular congestion without focal edema. ECHO 4/7 showed LVEF 60-65% (was 40-45% in 2022), no regional wall motion abnormalities, mild concentric LVH, and G2DD.   Current HF Medications: Diuretic: torsemide 20 mg BID Beta Blocker: metoprolol XL 12.5 mg daily ACE/ARB/ARNI: losartan 25 mg daily MRA: spironolactone 12.5 mg daily SGLT2i: Jardiance 10 mg daily  Prior to admission HF Medications: Diuretic: torsemide 20 mg BID Beta blocker: metoprolol XL 50 mg daily ACE/ARB/ARNI: lisinopril 20 mg daily  Pertinent Lab Values: Serum creatinine 1.23, BUN 31, Potassium 4.6, Sodium 132, BNP 409.6, Magnesium 2.4  Vital Signs: Weight: 213 lbs (admission weight: 222 lbs) Blood pressure: 120-150/60s  Heart rate: 50-60s  I/O: -1.1L yesterday; net -8.6L  Medication Assistance / Insurance Benefits Check: Does the patient have prescription insurance?  Yes Type of insurance plan: A[employer]na Medicare  Outpatient Pharmacy:  Prior to admission [facility] pharmacy: [facility] Is the patient willing to use [facility] TOC pharmacy at discharge? Yes Is the patient willing to transition their outpatient pharmacy to utilize a [facility] outpatient pharmacy?   Yes - transitioned to [facility] mail order    Assessment: 1. Acute on chronic diastolic CHF (LVEF 60-65%). NYHA class II symptoms. - Agree with resuming torsemide 20 mg BID - Continue metoprolol XL 12.5 mg daily (was not able to get dose on 4/8 and 4/9 due to bradycardia) - Continue losartan 25 mg daily - Continue spironolactone 12.5 mg daily  - Continue Jardiance 10 mg daily   Plan: 1) Medication  changes recommended at this time: - Agree with changes  2) Patient assistance: Sherryll Burger copay $47 - Farxiga/Jardiance copay $47 - Copays affordable per patient  3)  Education  - Patient has been educated on current HF medications and potential additions to HF medication regimen - Patient verbalizes understanding that over the next few months, these medication doses may change and more medications may be added to optimize HF regimen - Patient has been educated on basic disease state pathophysiology and goals of therapy   Sharen Hones, PharmD, BCPS Heart Failure Stewardship Pharmacist Phone [phone]

## 2022-04-29 DIAGNOSIS — I5033 Acute on chronic diastolic (congestive) heart failure: Secondary | ICD-10-CM | POA: Diagnosis not present

## 2022-04-29 LAB — BASIC METABOLIC PANEL
Anion gap: 11 (ref 5–15)
BUN: 41 mg/dL — ABNORMAL HIGH (ref 8–23)
CO2: 31 mmol/L (ref 22–32)
Calcium: 8.8 mg/dL — ABNORMAL LOW (ref 8.9–10.3)
Chloride: 94 mmol/L — ABNORMAL LOW (ref 98–111)
Creatinine, Ser: 1.52 mg/dL — ABNORMAL HIGH (ref 0.61–1.24)
GFR, Estimated: 46 mL/min — ABNORMAL LOW (ref >60)
Glucose, Bld: 132 mg/dL — ABNORMAL HIGH (ref 70–99)
Potassium: 4.7 mmol/L (ref 3.5–5.1)
Sodium: 136 mmol/L (ref 135–145)

## 2022-04-29 MED ORDER — PREDNISONE 20 MG PO TABS
40.0000 mg | ORAL_TABLET | Freq: Every day | ORAL | Status: DC
  Administered 2022-04-29 – 2022-04-30 (×2): 40 mg via ORAL
  Filled 2022-04-29 (×3): qty 2

## 2022-04-29 NOTE — Progress Notes (Signed)
Mobility Specialist Progress Note:   04/29/22 0915  Mobility  Activity Ambulated with assistance in hallway  Level of Assistance Standby assist, set-up cues, supervision of patient - no hands on  Assistive Device Four wheel walker  Distance Ambulated (ft) 400 ft  Activity Response Tolerated well  Mobility Referral Yes  $Mobility charge 1 Mobility   Pt eager for mobility session. Required no physical assistance throughout. X1 seated rest break taken d/t fatigue. SpO2 WFL on 3LO2. Pt back in chair with all needs met.   Addison Lank Mobility Specialist Please contact via SecureChat or  Rehab office at [phone]

## 2022-04-29 NOTE — Progress Notes (Signed)
PROGRESS NOTE    Kyle Farley  VZC:[id number] DOB: 15-Dec-1940 DOA: 04/24/2022 PCP: Janie Morning, NP (Inactive)  81/M with history of chronic diastolic CHF, COPD, paroxysmal A-fib, hypertension, dyslipidemia, hypothyroidism presented to the ED 4/6 with progressive shortness of breath, orthopnea and chest pain.  In the ED was bradycardic in the 30s on arrival, -Lower extremity edema, chest x-ray noted mild pulmonary vascular congestion. Echocardiogram 4/7 noted EF of 60-65% with mild LVH and grade 2 diastolic dysfunction -Improving on diuretics as well as treatment for COPD exacerbation  Subjective: Feels better overall, breathing improving, not back to baseline  Assessment and Plan:  Acute on chronic diastolic heart failure -Echo with EF 60 to 65%,mild LVH, RV -low normal, mild to moderate aortic stenosis.  -Diuresed well with IV Lasix, he is 12.5 L negative, creatinine has trended up to 1.5 today, hold torsemide and losartan today, resume torsemide tomorrow -Continue metoprolol, Jardiance and Aldactone  COPD exacerbation -Improving, transition to oral prednisone, DuoNebs, flutter valve, incentive spirometry -Smoking cessation counseling.   Paroxysmal atrial fibrillation Continue metoprolol, amiodarone and apixaban  Essential hypertension -Stable continue metoprolol, losartan  Acquired hypothyroidism Continue levothyroxine.  Anemia of chronic disease Hgb stable.   Hyperlipidemia Continue with atorvastatin.   Class 1 obesity Calculated BMI 32.7   Hyponatremia -Improved, monitor   DVT prophylaxis: Apixaban Code Status: Discussed CODE STATUS with the patient, he would like to have a natural death, DNR Family Communication: None present Disposition Plan: Home [place] if stable  Consultants:    Procedures:   Antimicrobials:    Objective: Vitals:   04/29/22 0537 04/29/22 0739 04/29/22 0815 04/29/22 1126  BP: (!) 162/59  (!) 136/54 (!) 112/54  Pulse: 63  66  62  Resp: 19  15 17   Temp: 97.8 F (36.6 C)  98 F (36.7 C) 97.7 F (36.5 C)  TempSrc: Oral  Oral Other (Comment)  SpO2: 98% 98% 97% 90%  Weight: 93.2 kg     Height:        Intake/Output Summary (Last 24 hours) at 04/29/2022 1224 Last data filed at 04/29/2022 0500 Gross per 24 hour  Intake 600 ml  Output 3900 ml  Net -3300 ml   Filed Weights   04/27/22 0039 04/28/22 0509 04/29/22 0537  Weight: 95.8 kg 97 kg 93.2 kg    Examination:  Elderly chronically ill male sitting up in bed, AAOx3, no distress HEENT: No JVD CVS: S1-S2, regular rhythm Lungs: Decreased breath sounds at the bases, improving air movement Abdomen: Soft, nontender, bowel sounds present Extremities: No edema    Data Reviewed:   CBC: Recent Labs  Lab 04/24/22 2256 04/25/22 0638  WBC 10.7* 16.1*  NEUTROABS  --  15.3*  HGB 11.3* 12.6*  HCT 35.3* 39.3  MCV 88.9 88.1  PLT 218 261   Basic Metabolic Panel: Recent Labs  Lab 04/24/22 2256 04/25/22 0638 04/26/22 0044 04/27/22 0612 04/28/22 0112 04/29/22 0027  NA 126* 130* 126* 131* 132* 136  K 4.4 3.8 4.1 4.5 4.6 4.7  CL 91* 91* 92* 96* 100 94*  CO2 21* 27 26 25 26 31   GLUCOSE 105* 146* 151* 123* 154* 132*  BUN 9 11 24* 31* 31* 41*  CREATININE 1.01 0.99 1.22 1.11 1.23 1.52*  CALCIUM 9.1 9.2 8.7* 8.8* 8.6* 8.8*  MG 1.7 2.1 2.4  --   --   --   PHOS  --  4.3  --   --   --   --  GFR: Estimated Creatinine Clearance: 43.7 mL/min (A) (by C-G formula based on SCr of 1.52 mg/dL (H)). Liver Function Tests: Recent Labs  Lab 04/25/22 0638  AST 27  ALT 27  ALKPHOS 85  BILITOT 0.6  PROT 7.6  ALBUMIN 3.8   No results for input(s): "LIPASE", "AMYLASE" in the last 168 hours. No results for input(s): "AMMONIA" in the last 168 hours. Coagulation Profile: No results for input(s): "INR", "PROTIME" in the last 168 hours. Cardiac Enzymes: No results for input(s): "CKTOTAL", "CKMB", "CKMBINDEX", "TROPONINI" in the last 168 hours. BNP (last 3  results) Recent Labs    12/22/21 1149 01/20/22 0000 01/28/22 1059  PROBNP 639* CANCELED 379   HbA1C: No results for input(s): "HGBA1C" in the last 72 hours. CBG: No results for input(s): "GLUCAP" in the last 168 hours. Lipid Profile: No results for input(s): "CHOL", "HDL", "LDLCALC", "TRIG", "CHOLHDL", "LDLDIRECT" in the last 72 hours. Thyroid Function Tests: No results for input(s): "TSH", "T4TOTAL", "FREET4", "T3FREE", "THYROIDAB" in the last 72 hours. Anemia Panel: No results for input(s): "VITAMINB12", "FOLATE", "FERRITIN", "TIBC", "IRON", "RETICCTPCT" in the last 72 hours. Urine analysis: No results found for: "COLORURINE", "APPEARANCEUR", "LABSPEC", "PHURINE", "GLUCOSEU", "HGBUR", "BILIRUBINUR", "KETONESUR", "PROTEINUR", "UROBILINOGEN", "NITRITE", "LEUKOCYTESUR" Sepsis Labs: @LABRCNTIP (procalcitonin:4,lacticidven:4)  )No results found for this or any previous visit (from the past 240 hour(s)).   Radiology Studies: No results found.   Scheduled Meds:  amiodarone  200 mg Oral BID   apixaban  5 mg Oral BID   atorvastatin  40 mg Oral Daily   benzonatate  200 mg Oral TID   empagliflozin  10 mg Oral Daily   feeding supplement  237 mL Oral Q24H   fluticasone furoate-vilanterol  1 puff Inhalation Daily   ipratropium-albuterol  3 mL Nebulization TID   levothyroxine  75 mcg Oral Daily   metoprolol succinate  12.5 mg Oral Daily   montelukast  10 mg Oral Q1200   pantoprazole  40 mg Oral BID   predniSONE  40 mg Oral Q breakfast   spironolactone  12.5 mg Oral Daily   sucralfate  1 g Oral TID WC & HS   tamsulosin  0.4 mg Oral Daily   traZODone  50 mg Oral QHS   Continuous Infusions:   LOS: 4 days    Time spent:    Zannie Cove, MD [facility]   04/29/2022, 12:24 PM

## 2022-04-29 NOTE — Progress Notes (Signed)
Occupational Therapy Treatment Patient Details Name: Kyle Farley MRN: [id number] DOB: 01/12/1941 Today's Date: 04/29/2022   History of present illness Pt is a 82 yo male admitted with dyspena, LE edema. Pt with CHF an COPD exacerbation.  PHM: CHF, COPD, afib   OT comments  Pt continuing to progress towards patient focused goals. Pt currently ambulating with supervision but remains limited by 02 desat with functional activity. Decreased Sp02 with functional activity limits performance, educated patient on the energy conservation and taking rest breaks when RR increases and during SOB. Pt to continue with acute skilled OT services to address above deficits and help transition to next level of care. With patient's level of support and instructions on energy conservation the DC plans remain appropriate.   Recommendations for follow up therapy are one component of a multi-disciplinary discharge planning process, led by the attending physician.  Recommendations may be updated based on patient status, additional functional criteria and insurance authorization.    Assistance Recommended at Discharge PRN  Patient can return home with the following  A little help with bathing/dressing/bathroom;Assistance with cooking/housework   Equipment Recommendations  None recommended by OT    Recommendations for Other Services      Precautions / Restrictions Precautions Precautions: Fall Precaution Comments: Pt has had one fall in last 6 months Restrictions Weight Bearing Restrictions: No       Mobility Bed Mobility               General bed mobility comments: Pt rec'd and left sitting in recliner    Transfers Overall transfer level: Modified independent Equipment used: Rollator (4 wheels) Transfers: Sit to/from Stand Sit to Stand: Modified independent (Device/Increase time)           General transfer comment: 1 seated rest break needed before returning back to room     Balance  Overall balance assessment: Mild deficits observed, not formally tested                                         ADL either performed or assessed with clinical judgement   ADL Overall ADL's : Needs assistance/impaired                                     Functional mobility during ADLs: Supervision/safety;Rollator (4 wheels)      Extremity/Trunk Assessment              Vision       Perception     Praxis      Cognition Arousal/Alertness: Awake/alert Behavior During Therapy: WFL for tasks assessed/performed Overall Cognitive Status: Within Functional Limits for tasks assessed                                          Exercises      Shoulder Instructions       General Comments Sp02 desat with functional ambulation down to 80% despite increasing 02 to 8L, returned to 100% on 3L during seated rest break. Desat on walk back to room on 3L Sp02 75% with RR up to 33. Quickly returned to 21 RR and Sp02 98% on 3L when resting in recliner    Pertinent Vitals/ Pain  Pain Assessment Pain Assessment: No/denies pain  Home Living                                          Prior Functioning/Environment              Frequency  Min 2X/week        Progress Toward Goals  OT Goals(current goals can now be found in the care plan section)  Progress towards OT goals: Progressing toward goals  Acute Rehab OT Goals Patient Stated Goal: to get home OT Goal Formulation: With patient Time For Goal Achievement: 05/10/22 Potential to Achieve Goals: Good  Plan Frequency remains appropriate;Discharge plan remains appropriate    Co-evaluation                 AM-PAC OT "6 Clicks" Daily Activity     Outcome Measure   Help from another person eating meals?: None Help from another person taking care of personal grooming?: None Help from another person toileting, which includes using toliet, bedpan,  or urinal?: None Help from another person bathing (including washing, rinsing, drying)?: A Little Help from another person to put on and taking off regular upper body clothing?: None Help from another person to put on and taking off regular lower body clothing?: A Little 6 Click Score: 22    End of Session Equipment Utilized During Treatment: Rollator (4 wheels);Oxygen;Gait belt  OT Visit Diagnosis: Unsteadiness on feet (R26.81)   Activity Tolerance Patient tolerated treatment well   Patient Left in chair;with call bell/phone within reach   Nurse Communication Mobility status        Time: 6004-5997 OT Time Calculation (min): 16 min  Charges: OT General Charges $OT Visit: 1 Visit OT Treatments $Therapeutic Activity: 8-22 mins  04/29/2022  AB, OTR/L  Acute Rehabilitation Services  Office: [phone]   [name] 04/29/2022, 4:18 PM

## 2022-04-29 NOTE — Progress Notes (Signed)
   Heart Failure Stewardship Pharmacist Progress Note   PCP: Janie Morning, NP (Inactive) PCP-Cardiologist: Norman Herrlich, MD    HPI:  82 yo M with PMH of CHF, afib, COPD, HTN, HLD, and hypothyroidism.  Presented to the ED on 4/6 with shortness of breath, orthopnea, and chest pain. Found to bradycardic in the 30s on arrival. Chronic LE edema. CXR with mild central vascular congestion without focal edema. ECHO 4/7 showed LVEF 60-65% (was 40-45% in 2022), no regional wall motion abnormalities, mild concentric LVH, and G2DD.   Current HF Medications: Diuretic: torsemide 20 mg BID Beta Blocker: metoprolol XL 12.5 mg daily MRA: spironolactone 12.5 mg daily SGLT2i: Jardiance 10 mg daily  Prior to admission HF Medications: Diuretic: torsemide 20 mg BID Beta blocker: metoprolol XL 50 mg daily ACE/ARB/ARNI: lisinopril 20 mg daily  Pertinent Lab Values: Serum creatinine 1.52, BUN 41, Potassium 4.7, Sodium 136, BNP 409.6, Magnesium 2.4  Vital Signs: Weight: 205 lbs (admission weight: 222 lbs) Blood pressure: 110-130/50s  Heart rate: 50-60s  I/O: -4.2L yesterday; net -12.5L  Medication Assistance / Insurance Benefits Check: Does the patient have prescription insurance?  Yes Type of insurance plan: Aetna Medicare  Outpatient Pharmacy:  Prior to admission outpatient pharmacy: [facility] Is the patient willing to use [facility] TOC pharmacy at discharge? Yes Is the patient willing to transition their outpatient pharmacy to utilize a [facility] outpatient pharmacy?   Yes - transitioned to [facility] mail order    Assessment: 1. Acute on chronic diastolic CHF (LVEF 60-65%). NYHA class II symptoms. - Continue torsemide 20 mg BID - Continue metoprolol XL 12.5 mg daily (was not able to get dose on 4/8 and 4/9 due to bradycardia) - Holding losartan 25 mg daily with AKI - Continue spironolactone 12.5 mg daily  - Continue Jardiance 10 mg daily   Plan: 1) Medication changes recommended at this time: -  Agree with changes  2) Patient assistance: Sherryll Burger copay $47 - Farxiga/Jardiance copay $47 - Copays affordable per patient  3)  Education  - Patient has been educated on current HF medications and potential additions to HF medication regimen - Patient verbalizes understanding that over the next few months, these medication doses may change and more medications may be added to optimize HF regimen - Patient has been educated on basic disease state pathophysiology and goals of therapy   Sharen Hones, PharmD, BCPS Heart Failure Stewardship Pharmacist Phone [phone]

## 2022-04-30 ENCOUNTER — Telehealth: Payer: Self-pay

## 2022-04-30 ENCOUNTER — Other Ambulatory Visit (HOSPITAL_COMMUNITY): Payer: Self-pay

## 2022-04-30 DIAGNOSIS — I5033 Acute on chronic diastolic (congestive) heart failure: Secondary | ICD-10-CM | POA: Diagnosis not present

## 2022-04-30 LAB — BASIC METABOLIC PANEL
Anion gap: 9 (ref 5–15)
BUN: 44 mg/dL — ABNORMAL HIGH (ref 8–23)
CO2: 29 mmol/L (ref 22–32)
Calcium: 9.1 mg/dL (ref 8.9–10.3)
Chloride: 101 mmol/L (ref 98–111)
Creatinine, Ser: 1.44 mg/dL — ABNORMAL HIGH (ref 0.61–1.24)
GFR, Estimated: 49 mL/min — ABNORMAL LOW (ref >60)
Glucose, Bld: 124 mg/dL — ABNORMAL HIGH (ref 70–99)
Potassium: 4.2 mmol/L (ref 3.5–5.1)
Sodium: 139 mmol/L (ref 135–145)

## 2022-04-30 MED ORDER — METOPROLOL SUCCINATE ER 25 MG PO TB24
12.5000 mg | ORAL_TABLET | Freq: Every day | ORAL | 0 refills | Status: DC
  Filled 2022-04-30: qty 15, 30d supply, fill #0

## 2022-04-30 MED ORDER — TAMSULOSIN HCL 0.4 MG PO CAPS
0.4000 mg | ORAL_CAPSULE | Freq: Every day | ORAL | 0 refills | Status: DC
  Filled 2022-04-30: qty 30, 30d supply, fill #0

## 2022-04-30 MED ORDER — PANTOPRAZOLE SODIUM 40 MG PO TBEC
40.0000 mg | DELAYED_RELEASE_TABLET | Freq: Every day | ORAL | 0 refills | Status: DC
  Filled 2022-04-30: qty 30, 30d supply, fill #0

## 2022-04-30 MED ORDER — PREDNISONE 20 MG PO TABS
20.0000 mg | ORAL_TABLET | Freq: Every day | ORAL | 0 refills | Status: DC
  Filled 2022-04-30: qty 3, 3d supply, fill #0

## 2022-04-30 MED ORDER — SPIRONOLACTONE 25 MG PO TABS
25.0000 mg | ORAL_TABLET | Freq: Every day | ORAL | 0 refills | Status: DC
  Filled 2022-04-30: qty 30, 30d supply, fill #0

## 2022-04-30 MED ORDER — EMPAGLIFLOZIN 10 MG PO TABS
10.0000 mg | ORAL_TABLET | Freq: Every day | ORAL | 1 refills | Status: DC
  Filled 2022-04-30: qty 30, 30d supply, fill #0

## 2022-04-30 NOTE — Progress Notes (Signed)
Mobility Specialist Progress Note:   04/30/22 0900  Mobility  Activity Ambulated with assistance in hallway  Level of Assistance Standby assist, set-up cues, supervision of patient - no hands on  Assistive Device Four point cane  Distance Ambulated (ft) 200 ft  Activity Response Tolerated well  Mobility Referral Yes  $Mobility charge 1 Mobility   Pt eager for mobility session. Required no physical assistance, only supervision for safety. SpO2 88-92% on RA. Required x1 seated break d/t fatigue. Pt back in chair with all needs met.   Addison Lank Mobility Specialist Please contact via SecureChat or  Rehab office at [phone]

## 2022-04-30 NOTE — Plan of Care (Signed)
  Problem: Education: Goal: Ability to demonstrate management of disease process will improve Outcome: Completed/Met Goal: Ability to verbalize understanding of medication therapies will improve Outcome: Completed/Met   Problem: Activity: Goal: Capacity to carry out activities will improve Outcome: Completed/Met   Problem: Cardiac: Goal: Ability to achieve and maintain adequate cardiopulmonary perfusion will improve Outcome: Completed/Met   

## 2022-04-30 NOTE — Progress Notes (Signed)
SATURATION QUALIFICATIONS:  Patient Saturations on Room Air at Rest = 92%  Patient Saturations on Room Air while Ambulating = 86%  Patient Saturations on 3 Liters of oxygen while Ambulating = 91%  Please briefly explain why patient needs home oxygen: Desaturation while ambulating  Kyle Farley, Noreene Filbert, RN

## 2022-04-30 NOTE — TOC Transition Note (Signed)
Transition of Care [facility]) - CM/SW Discharge Note   Patient Details  Name: Kyle Farley MRN: [id number] Date of Birth: 04/02/1940  Transition of Care [facility]) CM/SW Contact:  Leone Haven, RN Phone Number: 04/30/2022, 10:28 AM   Clinical Narrative:    Patient is for dc today, he has home oxygen with  [facility], 3 liters.   NCM contacted Beth at [facility], she states that he suppose to be on oxygen 24 hrs /day,  this NCM informed her MD has new order in for 2 liters continuously , she states she will go into Epic to get the order. Son is on the way to transport patient home today and he states he has an oxygen tank on the back seat of his truck.     Final next level of care: Home/Self Care Barriers to Discharge: Continued Medical Work up   Patient Goals and CMS Choice   Choice offered to / list presented to : NA  Discharge Placement                         Discharge Plan and Services Additional resources added to the After Visit Summary for   In-house Referral: NA Discharge Planning Services: CM Consult Post Acute Care Choice: NA          DME Arranged: N/A DME Agency: NA       HH Arranged: NA          Social Determinants of Health (SDOH) Interventions SDOH Screenings   Food Insecurity: No Food Insecurity (04/15/2022)  Housing: Low Risk  (04/26/2022)  Transportation Needs: No Transportation Needs (04/26/2022)  Utilities: Not At Risk (04/15/2022)  Alcohol Screen: Low Risk  (04/26/2022)  Depression (PHQ2-9): Low Risk  (04/02/2022)  Financial Resource Strain: Low Risk  (04/26/2022)  Physical Activity: Inactive (04/15/2022)  Social Connections: Moderately Isolated (04/15/2022)  Stress: No Stress Concern Present (04/15/2022)  Tobacco Use: High Risk (04/26/2022)     Readmission Risk Interventions    04/26/2022    3:28 PM  Readmission Risk Prevention Plan  Transportation Screening Complete  PCP or Specialist Appt within 3-5 Days  Complete  HRI or Home Care Consult Complete  Palliative Care Screening Not Applicable  Medication Review (RN Care Manager) Complete

## 2022-04-30 NOTE — Progress Notes (Addendum)
Discharge instructions reviewed with pt and his son. Copy of instructions given to pt. [facility] TOC Pharmacy filled scripts and were delivered to pt's room. Pt's son brought pt's portable O2 tank in and pt will be connected to it to travel home.  CHF teaching reviewed, pt states he was given the CHF booklet/education packet, pt able to teach back information already given.  Pt to be d/c'd via wheelchair with belongings, with son.      To be escorted by staff.

## 2022-04-30 NOTE — Progress Notes (Signed)
   Heart Failure Stewardship Pharmacist Progress Note   PCP: Janie Morning, NP (Inactive) PCP-Cardiologist: Norman Herrlich, MD    HPI:  82 yo M with PMH of CHF, afib, COPD, HTN, HLD, and hypothyroidism.  Presented to the ED on 4/6 with shortness of breath, orthopnea, and chest pain. Found to bradycardic in the 30s on arrival. Chronic LE edema. CXR with mild central vascular congestion without focal edema. ECHO 4/7 showed LVEF 60-65% (was 40-45% in 2022), no regional wall motion abnormalities, mild concentric LVH, and G2DD.   Current HF Medications: Beta Blocker: metoprolol XL 12.5 mg daily MRA: spironolactone 12.5 mg daily SGLT2i: Jardiance 10 mg daily  Prior to admission HF Medications: Diuretic: torsemide 20 mg BID Beta blocker: metoprolol XL 50 mg daily ACE/ARB/ARNI: lisinopril 20 mg daily  Pertinent Lab Values: Serum creatinine 1.44, BUN 44, Potassium 4.2, Sodium 139, BNP 409.6, Magnesium 2.4  Vital Signs: Weight: 201 lbs (admission weight: 222 lbs) Blood pressure: 120-160/60s  Heart rate: 50-60s  I/O: -1L yesterday; net -13.5L  Medication Assistance / Insurance Benefits Check: Does the patient have prescription insurance?  Yes Type of insurance plan: Aetna Medicare  Outpatient Pharmacy:  Prior [facility] Is the patient willing to use [facility] TOC pharmacy at discharge? Yes Is the patient willing to transition their outpatient pharmacy to utilize a [facility] outpatient pharmacy?   Yes - transitioned to [facility] mail order    Assessment: 1. Acute on chronic diastolic CHF (LVEF 60-65%). NYHA class II symptoms. - Off diuretics with AKI - creatinine slightly improved today - Continue metoprolol XL 12.5 mg daily (was not able to get dose on 4/8 and 4/9 due to bradycardia) - Holding losartan 25 mg daily with AKI - Continue spironolactone 12.5 mg daily  - Continue Jardiance 10 mg daily   Plan: 1) Medication changes recommended at this time: -  Continue current regimen  2) Patient assistance: Sherryll Burger copay $47 - Farxiga/Jardiance copay $47 - Copays affordable per patient  3)  Education  - Patient has been educated on current HF medications and potential additions to HF medication regimen - Patient verbalizes understanding that over the next few months, these medication doses may change and more medications may be added to optimize HF regimen - Patient has been educated on basic disease state pathophysiology and goals of therapy   Sharen Hones, PharmD, BCPS Heart Failure Stewardship Pharmacist Phone [phone]

## 2022-04-30 NOTE — Telephone Encounter (Signed)
   CCM RN Visit Note   04-30-2022 Name: Kyle Farley MRN: [id number]      DOB: 1940-05-18  Subjective: Kyle Farley is a 82 y.o. year old male who is a primary care patient of Dr. Blane Ohara. The patient was referred to the Chronic Care Management team for assistance with care management needs subsequent to provider initiation of CCM services and plan of care.      An unsuccessful telephone outreach was attempted today to contact the patient about Chronic Care Management needs.    Plan:Telephone follow up appointment with care management team member scheduled for:  05-03-2022  .pt

## 2022-05-03 ENCOUNTER — Telehealth: Payer: Medicare HMO

## 2022-05-03 ENCOUNTER — Ambulatory Visit (INDEPENDENT_AMBULATORY_CARE_PROVIDER_SITE_OTHER): Payer: Medicare HMO

## 2022-05-03 DIAGNOSIS — I5023 Acute on chronic systolic (congestive) heart failure: Secondary | ICD-10-CM

## 2022-05-03 DIAGNOSIS — I480 Paroxysmal atrial fibrillation: Secondary | ICD-10-CM

## 2022-05-03 DIAGNOSIS — J449 Chronic obstructive pulmonary disease, unspecified: Secondary | ICD-10-CM

## 2022-05-03 NOTE — Patient Instructions (Addendum)
Please call the care guide team at [phone] if you need to cancel or reschedule your appointment.   If you are experiencing a Mental Health or Behavioral Health Crisis or need someone to talk to, please call the Suicide and Crisis Lifeline: 988 call the [place] National Suicide Prevention Lifeline: (347)029-5380 or TTY: 231-332-6991 TTY 442-319-2452) to talk to a trained counselor call 1-800-273-TALK (toll free, 24 hour hotline) go to [facility] [address] [place] [phone])   Following is a copy of the CCM Program Consent:  CCM service includes personalized support from designated clinical staff supervised by the physician, including individualized plan of care and coordination with other care providers 24/7 contact phone numbers for assistance for urgent and routine care needs. Service will only be billed when office clinical staff spend 20 minutes or more in a month to coordinate care. Only one practitioner may furnish and bill the service in a calendar month. The patient may stop CCM services at amy time (effective at the end of the month) by phone call to the office staff. The patient will be responsible for cost sharing (co-pay) or up to 20% of the service fee (after annual deductible is met)  Following is a copy of your full provider care plan:      05/03/2022   Name: Kyle Farley            MRN: [id number]       DOB: November 29, 1940   Today's TOC FU Call Status: Today's TOC FU Call Status:: Successful TOC FU Call Competed TOC FU Call Complete Date: 05/03/22   Transition Care Management Follow-up Telephone Call Date of Discharge: 04/30/22 Discharge Facility: [facility]) Type of Discharge: Inpatient Admission Primary Inpatient Discharge Diagnosis:: Acute on Chronic Heart failure How have you been since you were released from the hospital?: Better Any questions or concerns?: No   Items Reviewed: Did you receive and understand  the discharge instructions provided?: Yes Medications obtained and verified?: Yes (Medications Reviewed) Any new allergies since your discharge?: No Dietary orders reviewed?: Yes Type of Diet Ordered:: Heart Healthy Do you have support at home?: Yes People in Home: child(ren), adult Name of Support/Comfort Primary Source: Son [facility] and Equipment/Supplies: Were Home Health Services Ordered?: No Any new equipment or medical supplies ordered?: No   Functional Questionnaire: Do you need assistance with bathing/showering or dressing?: No Do you need assistance with meal preparation?: No Do you need assistance with eating?: No Do you have difficulty maintaining continence: No Do you need assistance with getting out of bed/getting out of a chair/moving?: No Do you have difficulty managing or taking your medications?: No (son is setting up pill boxes and making sure he has his medications- picked up Lasix yesterday)   Follow up appointments reviewed: PCP Follow-up appointment confirmed?: Yes Date of PCP follow-up appointment?: 05/05/22 Follow-up Provider: Dr. Blane Ohara Specialist [facility] Follow-up appointment confirmed?: Yes Date of Specialist follow-up appointment?: 05/10/22 Follow-Up Specialty Provider:: [facility] Do you need transportation to your follow-up appointment?: No Do you understand care options if your condition(s) worsen?: Yes-patient verbalized understanding       Alto Denver RN, MSN, CCM RN Care Manager  Chronic Care Management Direct Number: [phone]       The patient verbalized understanding of instructions, educational materials, and care plan provided today and DECLINED offer to receive copy of patient instructions, educational materials, and care plan.  Telephone follow up  appointment with care management team member scheduled for: 06-16-2022

## 2022-05-03 NOTE — Transitions of Care (Post Inpatient/ED Visit) (Deleted)
   05/03/2022  Name: Kyle Farley MRN: [id number] DOB: [birth year]  Today's TOC FU Call Status: Completed     Transition Care Management Follow-up Telephone Call Date of Discharge: 04/30/22 Discharge Facility: [facility]) Type of Discharge: Inpatient Admission Primary Inpatient Discharge Diagnosis:: Acute on Chronic Heart failure How have you been since you were released from the hospital?: Better Any questions or concerns?: No  Items Reviewed: Did you receive and understand the discharge instructions provided?: Yes Medications obtained and verified?: Yes (Medications Reviewed) Any new allergies since your discharge?: No Dietary orders reviewed?: Yes Type of Diet Ordered:: Heart Healthy Do you have support at home?: Yes People in Home: child(ren), adult Name of Support/Comfort Primary Source: Son [facility] and Equipment/Supplies: Were Home Health Services Ordered?: No Any new equipment or medical supplies ordered?: No  Functional Questionnaire: Do you need assistance with bathing/showering or dressing?: No Do you need assistance with meal preparation?: No Do you need assistance with eating?: No Do you have difficulty maintaining continence: No Do you need assistance with getting out of bed/getting out of a chair/moving?: No Do you have difficulty managing or taking your medications?: No (son is setting up pill boxes and making sure he has his medications- picked up Lasix yesterday)  Follow up appointments reviewed: PCP Follow-up appointment confirmed?: Yes Date of PCP follow-up appointment?: 05/05/22 Follow-up Provider: Dr. Blane Ohara Specialist [facility] Follow-up appointment confirmed?: Yes Date of Specialist follow-up appointment?: 05/10/22 Follow-Up Specialty Provider:: [facility] Do you need transportation to your follow-up appointment?: No Do you understand care options if your condition(s) worsen?: Yes-patient verbalized  understanding    Alto Denver RN, MSN, CCM RN Care Manager  Chronic Care Management Direct Number: [phone]

## 2022-05-03 NOTE — Transitions of Care (Post Inpatient/ED Visit) (Cosign Needed)
   05/03/2022  Name: Kyle Farley MRN: [id number] DOB: 1940-05-01  Today's TOC FU Call Status: Today's TOC FU Call Status:: Successful TOC FU Call Competed TOC FU Call Complete Date: 05/03/22  Transition Care Management Follow-up Telephone Call Date of Discharge: 04/30/22 Discharge Facility: [facility]) Type of Discharge: Inpatient Admission Primary Inpatient Discharge Diagnosis:: Acute on Chronic Heart failure How have you been since you were released from the hospital?: Better Any questions or concerns?: No  Items Reviewed: Did you receive and understand the discharge instructions provided?: Yes Medications obtained and verified?: Yes (Medications Reviewed) Any new allergies since your discharge?: No Dietary orders reviewed?: Yes Type of Diet Ordered:: Heart Healthy Do you have support at home?: Yes People in Home: child(ren), adult Name of Support/Comfort Primary Source: Son [facility] and Equipment/Supplies: Were Home Health Services Ordered?: No Any new equipment or medical supplies ordered?: No  Functional Questionnaire: Do you need assistance with bathing/showering or dressing?: No Do you need assistance with meal preparation?: No Do you need assistance with eating?: No Do you have difficulty maintaining continence: No Do you need assistance with getting out of bed/getting out of a chair/moving?: No Do you have difficulty managing or taking your medications?: No (son is setting up pill boxes and making sure he has his medications- picked up Lasix yesterday)  Follow up appointments reviewed: PCP Follow-up appointment confirmed?: Yes Date of PCP follow-up appointment?: 05/05/22 Follow-up Provider: Dr. Blane Ohara Specialist [facility] Follow-up appointment confirmed?: Yes Date of Specialist follow-up appointment?: 05/10/22 Follow-Up Specialty Provider:: [facility] Do you need transportation to your follow-up appointment?: No Do you  understand care options if your condition(s) worsen?: Yes-patient verbalized understanding    Alto Denver RN, MSN, CCM RN Care Manager  Chronic Care Management Direct Number: [phone]

## 2022-05-04 ENCOUNTER — Other Ambulatory Visit: Payer: Medicare HMO | Admitting: Pharmacist

## 2022-05-05 ENCOUNTER — Telehealth: Payer: Self-pay | Admitting: Pharmacist

## 2022-05-05 ENCOUNTER — Encounter: Payer: Self-pay | Admitting: Family Medicine

## 2022-05-05 ENCOUNTER — Ambulatory Visit (INDEPENDENT_AMBULATORY_CARE_PROVIDER_SITE_OTHER): Payer: Medicare HMO | Admitting: Family Medicine

## 2022-05-05 VITALS — BP 120/56 | HR 80 | Temp 97.3°F | Resp 18 | Ht 69.0 in | Wt 211.0 lb

## 2022-05-05 DIAGNOSIS — J9611 Chronic respiratory failure with hypoxia: Secondary | ICD-10-CM

## 2022-05-05 DIAGNOSIS — R69 Illness, unspecified: Secondary | ICD-10-CM | POA: Diagnosis not present

## 2022-05-05 DIAGNOSIS — J449 Chronic obstructive pulmonary disease, unspecified: Secondary | ICD-10-CM

## 2022-05-05 DIAGNOSIS — E039 Hypothyroidism, unspecified: Secondary | ICD-10-CM

## 2022-05-05 DIAGNOSIS — Z7951 Long term (current) use of inhaled steroids: Secondary | ICD-10-CM

## 2022-05-05 DIAGNOSIS — I480 Paroxysmal atrial fibrillation: Secondary | ICD-10-CM

## 2022-05-05 DIAGNOSIS — F17210 Nicotine dependence, cigarettes, uncomplicated: Secondary | ICD-10-CM

## 2022-05-05 DIAGNOSIS — I10 Essential (primary) hypertension: Secondary | ICD-10-CM

## 2022-05-05 MED ORDER — ALBUTEROL SULFATE HFA 108 (90 BASE) MCG/ACT IN AERS
2.0000 | INHALATION_SPRAY | RESPIRATORY_TRACT | 2 refills | Status: DC | PRN
Start: 2022-05-05 — End: 2022-09-21

## 2022-05-05 NOTE — Progress Notes (Unsigned)
Subjective:  Patient ID: Kyle Farley, male    DOB: Jan 09, 1941  Age: 82 y.o. MRN: [id number]  Chief Complaint  Patient presents with   Hospitalization Follow-up    HPI Patient comes in for hospital follow-up.  He was admitted to [facility] on April 7 and discharged on April 30, 2022.  He was admitted for acute on chronic diastolic heart failure complicated by acute hypoxic respiratory distress. He was discharged on oxygen 2 L PNC.   He is scheduled for follow-up with Cardiology on Monday April 22 @ 11:00 o'clock and Pulmonary [facility]) June 3 @ 10:00.       04/02/2022   11:24 AM 04/02/2022   10:41 AM 02/16/2022    2:56 PM 11/10/2021    2:16 PM 12/27/2017    2:31 PM  Depression screen PHQ 2/9  Decreased Interest 0 0 0 0 0  Down, Depressed, Hopeless 0 0 0 0 0  PHQ - 2 Score 0 0 0 0 0  Altered sleeping    0   Tired, decreased energy    0   Change in appetite    0   Feeling bad or failure about yourself     0   Trouble concentrating    0   Moving slowly or fidgety/restless    0   Suicidal thoughts    0   PHQ-9 Score    0   Difficult doing work/chores    Not difficult at all          11/10/2021    2:14 PM 02/16/2022    2:55 PM 04/02/2022   10:41 AM 04/02/2022   11:24 AM 04/15/2022    1:49 PM  Fall Risk  Falls in the past year? 0 0 0 0 1  Was there an injury with Fall? 0 0 0 0 0  Fall Risk Category Calculator 0 0 0 0 1  Fall Risk Category (Retired) Low      (RETIRED) Patient Fall Risk Level Moderate fall risk      Patient at Risk for Falls Due to Impaired balance/gait Impaired balance/gait No Fall Risks  Impaired balance/gait  Fall risk Follow up Falls evaluation completed  Falls evaluation completed  Falls evaluation completed;Education provided;Falls prevention discussed      Review of Systems  Constitutional:  Positive for fatigue. Negative for chills and fever.  HENT:  Positive for ear pain.   Respiratory:  Positive for cough, shortness of breath and  wheezing.   Cardiovascular:  Negative for chest pain and palpitations.  Gastrointestinal:  Negative for abdominal pain.  Genitourinary:  Negative for dysuria and frequency.  Musculoskeletal:  Negative for arthralgias and back pain.  Neurological:  Negative for dizziness and headaches.  Psychiatric/Behavioral:  Negative for decreased concentration. The patient is not nervous/anxious.     Current Outpatient Medications on File Prior to Visit  Medication Sig Dispense Refill   amiodarone (PACERONE) 200 MG tablet Take 200 mg by mouth 2 (two) times daily.     apixaban (ELIQUIS) 5 MG TABS tablet Take 1 tablet (5 mg total) by mouth 2 (two) times daily. 60 tablet 11   atorvastatin (LIPITOR) 40 MG tablet Take 1 tablet (40 mg total) by mouth daily. 90 tablet 3   diphenhydrAMINE HCl, Sleep, (ZZZQUIL PO) Take 1 tablet by mouth at bedtime.     empagliflozin (JARDIANCE) 10 MG TABS tablet Take 1 tablet (10 mg total) by mouth daily. 30 tablet 1   fluticasone furoate-vilanterol (BREO  ELLIPTA) 200-25 MCG/ACT AEPB Inhale 1 puff into the lungs daily.     Fluticasone-Umeclidin-Vilant (TRELEGY ELLIPTA) 200-62.5-25 MCG/ACT AEPB Inhale 2 puffs into the lungs daily. 60 each 1   ipratropium-albuterol (DUONEB) 0.5-2.5 (3) MG/3ML SOLN Inhale 3 mLs into the lungs every 6 (six) hours as needed. (Patient taking differently: Inhale 3 mLs into the lungs every 6 (six) hours as needed (Wheezing/SOB).) 360 mL 1   levothyroxine (SYNTHROID) 75 MCG tablet Take 1 tablet (75 mcg total) by mouth daily. 90 tablet 3   metoprolol succinate (TOPROL-XL) 25 MG 24 hr tablet Take 0.5 tablets (12.5 mg total) by mouth daily. Take with or immediately following a meal. 30 tablet 0   montelukast (SINGULAIR) 10 MG tablet Take 1 tablet (10 mg total) by mouth daily at 12 noon. 90 tablet 1   OXYGEN Inhale 3 L into the lungs as needed.     pantoprazole (PROTONIX) 40 MG tablet Take 1 tablet (40 mg total) by mouth daily. 30 tablet 0   spironolactone  (ALDACTONE) 25 MG tablet Take 1 tablet (25 mg total) by mouth daily. 30 tablet 0   tamsulosin (FLOMAX) 0.4 MG CAPS capsule Take 1 capsule (0.4 mg total) by mouth daily. 30 capsule 0   torsemide (DEMADEX) 20 MG tablet Take 1 tablet (20 mg total) by mouth 2 (two) times daily as needed. (Patient taking differently: Take 20 mg by mouth 2 (two) times daily.) 180 tablet 1   Magnesium 400 MG CAPS Take 400 mg by mouth daily. (Patient not taking: Reported on 05/05/2022)     No current facility-administered medications on file prior to visit.   Past Medical History:  Diagnosis Date   Alcohol use 09/12/2020   Arthritis    CHF (congestive heart failure)    Cigarette smoker 10/21/2014   COPD (chronic obstructive pulmonary disease)    COPD exacerbation 01/24/2017   Dyspnea    Dysrhythmia    atrial fibrillation   Essential hypertension 10/21/2014   Femur fracture, right 09/12/2020   GERD (gastroesophageal reflux disease)    History of kidney stones    Hyperlipidemia 01/24/2017   Hypertension    Kidney stones 01/24/2017   Paroxysmal atrial fibrillation 10/21/2014   Pressure injury of skin 09/16/2020   Past Surgical History:  Procedure Laterality Date   LEFT HEART CATH AND CORONARY ANGIOGRAPHY N/A 09/15/2020   Procedure: LEFT HEART CATH AND CORONARY ANGIOGRAPHY;  Surgeon: Tonny Bollman, MD;  Location: [facility] INVASIVE CV LAB;  Service: Cardiovascular;  Laterality: N/A;   LEG SURGERY Right    steal pin placed in right leg 35 years ago   ORIF FEMUR FRACTURE Right 09/15/2020   Procedure: OPEN REDUCTION INTERNAL FIXATION FEMORAL SHAFT FRACTURE;  Surgeon: Roby Lofts, MD;  Location: [facility] OR;  Service: Orthopedics;  Laterality: Right;    Family History  Problem Relation Age of Onset   Cancer Mother    Diabetes Mother    Breast cancer Sister    Social History   Socioeconomic History   Marital status: Widowed    Spouse name: Not on file   Number of children: 3   Years of education: Not on file   Highest  education level: High school graduate  Occupational History   Occupation: retired  Tobacco Use   Smoking status: Every Day    Packs/day: 1.00    Years: 62.00    Additional pack years: 0.00    Total pack years: 62.00    Types: Cigarettes, Cigars   Smokeless tobacco:  Never  Vaping Use   Vaping Use: Never used  Substance and Sexual Activity   Alcohol use: Yes    Alcohol/week: 5.0 standard drinks of alcohol    Types: 5 Standard drinks or equivalent per week    Comment: 4-5 beers per day   Drug use: No   Sexual activity: Not Currently  Other Topics Concern   Not on file  Social History Narrative   Not on file   Social Determinants of Health   Financial Resource Strain: Low Risk  (04/26/2022)   Overall Financial Resource Strain (CARDIA)    Difficulty of Paying Living Expenses: Not very hard  Food Insecurity: No Food Insecurity (04/15/2022)   Hunger Vital Sign    Worried About Running Out of Food in the Last Year: Never true    Ran Out of Food in the Last Year: Never true  Transportation Needs: No Transportation Needs (04/26/2022)   PRAPARE - Administrator, Civil Service (Medical): No    Lack of Transportation (Non-Medical): No  Physical Activity: Inactive (04/15/2022)   Exercise Vital Sign    Days of Exercise per Week: 0 days    Minutes of Exercise per Session: 0 min  Stress: No Stress Concern Present (04/15/2022)   [employer] of Occupational Health - Occupational Stress Questionnaire    Feeling of Stress : Not at all  Social Connections: Moderately Isolated (04/15/2022)   Social Connection and Isolation Panel [NHANES]    Frequency of Communication with Friends and Family: More than three times a week    Frequency of Social Gatherings with Friends and Family: More than three times a week    Attends Religious Services: More than 4 times per year    Active Member of [employer] or Organizations: No    Attends Banker Meetings: Never    Marital Status:  Widowed    Objective:  BP (!) 120/56   Pulse 80   Temp (!) 97.3 F (36.3 C)   Resp 18   Ht 5\' 9"  (1.753 m)   Wt 211 lb (95.7 kg)   SpO2 99%   BMI 31.16 kg/m      05/05/2022    9:01 AM 04/30/2022    7:40 AM 04/30/2022    5:02 AM  BP/Weight  Systolic BP 120 156 160  Diastolic BP 56 64 62  Wt. (Lbs) 211  201.5  BMI 31.16 kg/m2  29.76 kg/m2    Physical Exam Vitals reviewed.  Constitutional:      Appearance: Normal appearance. He is obese.  HENT:     Right Ear: Tympanic membrane normal.     Left Ear: Tympanic membrane normal.  Neck:     Vascular: No carotid bruit.  Cardiovascular:     Rate and Rhythm: Normal rate and regular rhythm.     Heart sounds: Normal heart sounds.  Pulmonary:     Effort: Pulmonary effort is normal.     Breath sounds: Normal breath sounds. No wheezing, rhonchi or rales.  Abdominal:     General: Bowel sounds are normal.     Palpations: Abdomen is soft.     Tenderness: There is no abdominal tenderness.  Musculoskeletal:     Right lower leg: Edema (much improved per patient.) present.     Left lower leg: Edema (much improved per patient.) present.  Skin:    Comments: Dry cracking skin BL lower legs.   Neurological:     Mental Status: He is alert.  Psychiatric:  Mood and Affect: Mood normal.        Behavior: Behavior normal.    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 Continue breo and albuterol hfa.   Orders: -     Albuterol Sulfate HFA; Inhale 2 puffs into the lungs every 4 (four) hours as needed for wheezing or shortness of breath.  Dispense: 48 each; Refill: 2  Paroxysmal atrial fibrillation Assessment & Plan: Management per specialist.   Essential hypertension Assessment & Plan: No changes to medicines. Metoprolol, Torsemide, spironolactone,  Continue to work on eating a healthy diet and exercise.  Labs drawn today.    Orders: -     CBC with Differential/Platelet -     Comprehensive metabolic panel  Acquired hypothyroidism Assessment & Plan: Previously well controlled Continue Synthroid at current dose  Recheck TSH and adjust Synthroid as indicated    Orders: -     T4, free -     TSH  Cigarette smoker Assessment & Plan: Recommended smoking cessation.   Chronic respiratory failure with hypoxia Assessment & Plan: Continue 2 L PNC.      Meds ordered this encounter  Medications   albuterol (VENTOLIN HFA) 108 (90 Base) MCG/ACT inhaler    Sig: Inhale 2 puffs into the lungs every 4 (four) hours as needed for wheezing or shortness of breath.    Dispense:  48 each    Refill:  2    Orders Placed This Encounter  Procedures   CBC with Differential/Platelet   Comprehensive metabolic panel   T4, free   TSH     Follow-up: Return in about 3 months (around 08/04/2022) for chronic fasting.   I,Marla I Leal-Borjas,acting as a scribe for Blane Ohara, MD.,have documented all relevant documentation on the behalf of Blane Ohara, MD,as directed by  Blane Ohara, MD while in the presence of Blane Ohara, MD.    An After Visit Summary was printed and given to the patient.  I attest that I have reviewed this visit and agree with the plan scribed by my staff.   Blane Ohara, MD [facility] [phone]

## 2022-05-05 NOTE — Progress Notes (Unsigned)
Attempted to contact patient for scheduled appointment for medication management. Left HIPAA compliant message for patient to return my call at their convenience.    Catie T. Halynn Reitano, PharmD, BCACP, CPP [facility] [phone]  

## 2022-05-05 NOTE — Progress Notes (Unsigned)
Received voicemail back from patient late yesterday. He is currently at appointment with PCP; will outreach later this afternoon to reschedule phone appointment.   inhaled corticosteroids g

## 2022-05-06 LAB — CBC WITH DIFFERENTIAL/PLATELET
Basophils Absolute: 0 10*3/uL (ref 0.0–0.2)
Basos: 0 %
EOS (ABSOLUTE): 0.1 10*3/uL (ref 0.0–0.4)
Eos: 1 %
Hematocrit: 35.4 % — ABNORMAL LOW (ref 37.5–51.0)
Hemoglobin: 11.8 g/dL — ABNORMAL LOW (ref 13.0–17.7)
Immature Grans (Abs): 0.3 10*3/uL — ABNORMAL HIGH (ref 0.0–0.1)
Immature Granulocytes: 2 %
Lymphocytes Absolute: 1.6 10*3/uL (ref 0.7–3.1)
Lymphs: 11 %
MCH: 28.5 pg (ref 26.6–33.0)
MCHC: 33.3 g/dL (ref 31.5–35.7)
MCV: 86 fL (ref 79–97)
Monocytes Absolute: 1.3 10*3/uL — ABNORMAL HIGH (ref 0.1–0.9)
Monocytes: 8 %
Neutrophils Absolute: 11.9 10*3/uL — ABNORMAL HIGH (ref 1.4–7.0)
Neutrophils: 78 %
Platelets: 276 10*3/uL (ref 150–450)
RBC: 4.14 x10E6/uL (ref 4.14–5.80)
RDW: 14.2 % (ref 11.6–15.4)
WBC: 15.2 10*3/uL — ABNORMAL HIGH (ref 3.4–10.8)

## 2022-05-06 LAB — COMPREHENSIVE METABOLIC PANEL
ALT: 35 IU/L (ref 0–44)
AST: 25 IU/L (ref 0–40)
Albumin/Globulin Ratio: 1.5 (ref 1.2–2.2)
Albumin: 4 g/dL (ref 3.8–4.8)
Alkaline Phosphatase: 82 IU/L (ref 44–121)
BUN/Creatinine Ratio: 20 (ref 10–24)
BUN: 23 mg/dL (ref 8–27)
Bilirubin Total: 0.4 mg/dL (ref 0.0–1.2)
CO2: 22 mmol/L (ref 20–29)
Calcium: 9.2 mg/dL (ref 8.6–10.2)
Chloride: 97 mmol/L (ref 96–106)
Creatinine, Ser: 1.16 mg/dL (ref 0.76–1.27)
Globulin, Total: 2.6 g/dL (ref 1.5–4.5)
Glucose: 109 mg/dL — ABNORMAL HIGH (ref 70–99)
Potassium: 5.6 mmol/L — ABNORMAL HIGH (ref 3.5–5.2)
Sodium: 133 mmol/L — ABNORMAL LOW (ref 134–144)
Total Protein: 6.6 g/dL (ref 6.0–8.5)
eGFR: 64 mL/min/{1.73_m2} (ref >59)

## 2022-05-06 LAB — TSH: TSH: 3.77 u[IU]/mL (ref 0.450–4.500)

## 2022-05-06 LAB — T4, FREE: Free T4: 1.55 ng/dL (ref 0.82–1.77)

## 2022-05-07 ENCOUNTER — Other Ambulatory Visit: Payer: Self-pay

## 2022-05-07 DIAGNOSIS — E875 Hyperkalemia: Secondary | ICD-10-CM

## 2022-05-07 NOTE — Assessment & Plan Note (Signed)
The current medical regimen is effective;  continue present plan and medications. Continue breo and albuterol hfa.

## 2022-05-07 NOTE — Assessment & Plan Note (Signed)
Recommended smoking cessation. 

## 2022-05-07 NOTE — Assessment & Plan Note (Signed)
Previously well controlled Continue Synthroid at current dose  Recheck TSH and adjust Synthroid as indicated   

## 2022-05-07 NOTE — Assessment & Plan Note (Signed)
No changes to medicines. Metoprolol, Torsemide, spironolactone,  Continue to work on eating a healthy diet and exercise.  Labs drawn today.

## 2022-05-07 NOTE — Progress Notes (Signed)
Blood count abnormal.  White count is elevated but is also on prednisone.  Hemoglobin is fairly stable.   Kidney function normal.  Liver function normal. Potassium is elevated.  Please check if patient is on any potassium and if he is discontinue it. Please check how often patient is taking torsemide.  Directions 1 twice daily as needed.  I would recommend increase this to twice a day over the weekend and repeat potassium Monday.

## 2022-05-07 NOTE — Assessment & Plan Note (Signed)
Management per specialist. 

## 2022-05-09 DIAGNOSIS — J9611 Chronic respiratory failure with hypoxia: Secondary | ICD-10-CM

## 2022-05-09 HISTORY — DX: Chronic respiratory failure with hypoxia: J96.11

## 2022-05-09 NOTE — Assessment & Plan Note (Signed)
Continue 2 L PNC.

## 2022-05-10 ENCOUNTER — Ambulatory Visit (HOSPITAL_COMMUNITY)
Admit: 2022-05-10 | Discharge: 2022-05-10 | Disposition: A | Discharge status: Home / Self Care | Payer: Medicare HMO | Attending: Physician Assistant | Admitting: Physician Assistant

## 2022-05-10 ENCOUNTER — Ambulatory Visit (HOSPITAL_COMMUNITY): Payer: Medicare HMO

## 2022-05-10 ENCOUNTER — Ambulatory Visit (HOSPITAL_BASED_OUTPATIENT_CLINIC_OR_DEPARTMENT_OTHER)
Admission: RE | Admit: 2022-05-10 | Discharge: 2022-05-10 | Disposition: A | Discharge status: Home / Self Care | Payer: Medicare HMO | Source: Ambulatory Visit | Attending: Physician Assistant | Admitting: Physician Assistant

## 2022-05-10 ENCOUNTER — Encounter (HOSPITAL_COMMUNITY): Payer: Self-pay

## 2022-05-10 ENCOUNTER — Telehealth (HOSPITAL_COMMUNITY): Payer: Self-pay

## 2022-05-10 ENCOUNTER — Encounter (HOSPITAL_BASED_OUTPATIENT_CLINIC_OR_DEPARTMENT_OTHER): Payer: Self-pay

## 2022-05-10 VITALS — BP 120/60 | HR 45 | Wt 215.4 lb

## 2022-05-10 DIAGNOSIS — I350 Nonrheumatic aortic (valve) stenosis: Secondary | ICD-10-CM | POA: Diagnosis not present

## 2022-05-10 DIAGNOSIS — R200 Anesthesia of skin: Secondary | ICD-10-CM | POA: Diagnosis not present

## 2022-05-10 DIAGNOSIS — E871 Hypo-osmolality and hyponatremia: Secondary | ICD-10-CM | POA: Insufficient documentation

## 2022-05-10 DIAGNOSIS — I110 Hypertensive heart disease with heart failure: Secondary | ICD-10-CM | POA: Diagnosis not present

## 2022-05-10 DIAGNOSIS — Z7984 Long term (current) use of oral hypoglycemic drugs: Secondary | ICD-10-CM | POA: Insufficient documentation

## 2022-05-10 DIAGNOSIS — H539 Unspecified visual disturbance: Secondary | ICD-10-CM | POA: Diagnosis not present

## 2022-05-10 DIAGNOSIS — R0602 Shortness of breath: Secondary | ICD-10-CM | POA: Insufficient documentation

## 2022-05-10 DIAGNOSIS — Z79899 Other long term (current) drug therapy: Secondary | ICD-10-CM | POA: Diagnosis not present

## 2022-05-10 DIAGNOSIS — I5032 Chronic diastolic (congestive) heart failure: Secondary | ICD-10-CM

## 2022-05-10 DIAGNOSIS — J449 Chronic obstructive pulmonary disease, unspecified: Secondary | ICD-10-CM | POA: Insufficient documentation

## 2022-05-10 DIAGNOSIS — E875 Hyperkalemia: Secondary | ICD-10-CM | POA: Insufficient documentation

## 2022-05-10 DIAGNOSIS — Z720 Tobacco use: Secondary | ICD-10-CM

## 2022-05-10 DIAGNOSIS — I480 Paroxysmal atrial fibrillation: Secondary | ICD-10-CM

## 2022-05-10 DIAGNOSIS — R5383 Other fatigue: Secondary | ICD-10-CM | POA: Insufficient documentation

## 2022-05-10 DIAGNOSIS — E785 Hyperlipidemia, unspecified: Secondary | ICD-10-CM | POA: Diagnosis not present

## 2022-05-10 DIAGNOSIS — F1010 Alcohol abuse, uncomplicated: Secondary | ICD-10-CM | POA: Diagnosis not present

## 2022-05-10 DIAGNOSIS — J9611 Chronic respiratory failure with hypoxia: Secondary | ICD-10-CM | POA: Insufficient documentation

## 2022-05-10 DIAGNOSIS — E039 Hypothyroidism, unspecified: Secondary | ICD-10-CM | POA: Insufficient documentation

## 2022-05-10 DIAGNOSIS — Z7901 Long term (current) use of anticoagulants: Secondary | ICD-10-CM | POA: Insufficient documentation

## 2022-05-10 DIAGNOSIS — I2510 Atherosclerotic heart disease of native coronary artery without angina pectoris: Secondary | ICD-10-CM | POA: Insufficient documentation

## 2022-05-10 DIAGNOSIS — G319 Degenerative disease of nervous system, unspecified: Secondary | ICD-10-CM | POA: Insufficient documentation

## 2022-05-10 DIAGNOSIS — R42 Dizziness and giddiness: Secondary | ICD-10-CM | POA: Insufficient documentation

## 2022-05-10 HISTORY — DX: Shortness of breath: R06.02

## 2022-05-10 LAB — COMPREHENSIVE METABOLIC PANEL
ALT: 42 U/L (ref 0–44)
AST: 29 U/L (ref 15–41)
Albumin: 3.2 g/dL — ABNORMAL LOW (ref 3.5–5.0)
Alkaline Phosphatase: 65 U/L (ref 38–126)
Anion gap: 8 (ref 5–15)
BUN: 19 mg/dL (ref 8–23)
CO2: 26 mmol/L (ref 22–32)
Calcium: 8.8 mg/dL — ABNORMAL LOW (ref 8.9–10.3)
Chloride: 96 mmol/L — ABNORMAL LOW (ref 98–111)
Creatinine, Ser: 1.21 mg/dL (ref 0.61–1.24)
GFR, Estimated: >60 mL/min (ref >60)
Glucose, Bld: 116 mg/dL — ABNORMAL HIGH (ref 70–99)
Potassium: 4.7 mmol/L (ref 3.5–5.1)
Sodium: 130 mmol/L — ABNORMAL LOW (ref 135–145)
Total Bilirubin: 0.4 mg/dL (ref 0.3–1.2)
Total Protein: 6.6 g/dL (ref 6.5–8.1)

## 2022-05-10 NOTE — Progress Notes (Addendum)
HEART & VASCULAR TRANSITION OF CARE CONSULT NOTE     Referring Physician: Dr. Jomarie Longs Primary Care: Dr. Sedalia Muta Primary Cardiologist: Dr. Dulce Sellar  HPI: Referred to clinic by Dr. Jomarie Longs with [facility] for heart failure consultation. 82 y.o. male with history of CHF, nonobstructive CAD by cardiac cath 08/22, Afib/aflutter, HTN, COPD and chronic respiratory failure on O2 as needed, HLD, hypothyroidism, ongoing alcohol and tobacco abuse.  Admitted to [facility] with recurrent atrial flutter in May 2023 in setting of noncompliance with medical therapy including amiodarone. Amiodarone later discontinued d/t underlying lung disease. Echo at that time with EF 50-55%, severe LAE.  He was admitted to [facility] 11/14-11/17/23 with COPD exacerbation, acute on chronic hyponatremia and hyperkalemia.   He last saw his cardiologist in January 2024. He was off amiodarone and not felt to be a candidate for rhythm control strategies with antiarrhythmics or ablation.  At some point since then amiodarone has been added back.  Patient was admitted earlier this month with acute on chronic respiratory failure 2/2 COPD exacerbation and acute on chronic CHF. Apparently was bradycardic with HR in 30s on arrival. No significant arrhythmias or bradycardia on review of available rhythm strips. He was diuresed with IV lasix. GDMT titrated. Echo during admit with EF 60-65%, grade II DD, RV moderately enlarged with low normal function, moderate BAE, mild to moderate AS with mean gradient 17 mmHg  He is here today for hospital follow-up. He feels not quite himself the last 2 days. Notes more fatigue and weakness. No blurry vision or visual field defects but states vision seems "off". Feels "swimmy headed" and dizzy just sitting. Both hands feel numb, but no weakness or change in strength. No weakness on either side of body. No speech difficulty. Lower extremity edema has significantly improved. Home weight has been stable around 203 lb. No  orthopnea or PND. Continues to have quite a bit of shortness of breath with exertion but no worse than his baseline.   Eats fast food regularly and does not restrict sodium intake. Recently ordered "healthy meals" through his insurance plan.  Continues to smoke and is not interesting in quitting. Drinks 8-10 beers daily.   Review of Systems: [y] = yes,  = no   General: Weight gain ; Weight loss ; Anorexia ; Fatigue [Y ]; Fever ; Chills ; Weakness   Cardiac: Chest pain/pressure ; Resting SOB ; Exertional SOB [Y]; Orthopnea ; Pedal Edema ; Palpitations ; Syncope ; Presyncope ; Paroxysmal nocturnal dyspnea[ ]   Pulmonary: Cough ; Wheezing[ ] ; Hemoptysis[ ] ; Sputum ; Snoring   GI: Vomiting[ ] ; Dysphagia[ ] ; Melena[ ] ; Hematochezia ; Heartburn[ ] ; Abdominal pain ; Constipation ; Diarrhea ; BRBPR   GU: Hematuria[ ] ; Dysuria ; Nocturia[ ]   Vascular: Pain in legs with walking ; Pain in feet with lying flat ; Non-healing sores ; Stroke ; TIA ; Slurred speech ;  Neuro: Headaches[ ] ; Vertigo[ ] ; Seizures[ ] ; Paresthesias[ ] ;Blurred vision ; Diplopia ; Vision changes [Y]  Ortho/Skin: Arthritis ; Joint pain ; Muscle pain ; Joint swelling ; Back Pain ; Rash   Psych: Depression[ ] ; Anxiety[ ]   Heme: Bleeding problems ; Clotting disorders ; Anemia   Endocrine: Diabetes ; Thyroid dysfunction[Y]   Past Medical History:  Diagnosis  Date   Alcohol use 09/12/2020   Arthritis    CHF (congestive heart failure)    Cigarette smoker 10/21/2014   COPD (chronic obstructive pulmonary disease)    COPD exacerbation 01/24/2017   Dyspnea    Dysrhythmia    atrial fibrillation   Essential hypertension 10/21/2014   Femur fracture, right 09/12/2020   GERD (gastroesophageal reflux disease)    History of kidney stones    Hyperlipidemia 01/24/2017   Hypertension    Kidney stones 01/24/2017   Paroxysmal  atrial fibrillation 10/21/2014   Pressure injury of skin 09/16/2020    Current Outpatient Medications  Medication Sig Dispense Refill   albuterol (VENTOLIN HFA) 108 (90 Base) MCG/ACT inhaler Inhale 2 puffs into the lungs every 4 (four) hours as needed for wheezing or shortness of breath. 48 each 2   apixaban (ELIQUIS) 5 MG TABS tablet Take 1 tablet (5 mg total) by mouth 2 (two) times daily. 60 tablet 11   atorvastatin (LIPITOR) 40 MG tablet Take 1 tablet (40 mg total) by mouth daily. 90 tablet 3   diphenhydrAMINE HCl, Sleep, (ZZZQUIL PO) Take 1 tablet by mouth at bedtime.     empagliflozin (JARDIANCE) 10 MG TABS tablet Take 1 tablet (10 mg total) by mouth daily. 30 tablet 1   fluticasone furoate-vilanterol (BREO ELLIPTA) 200-25 MCG/ACT AEPB Inhale 1 puff into the lungs daily.     Fluticasone-Umeclidin-Vilant (TRELEGY ELLIPTA) 200-62.5-25 MCG/ACT AEPB Inhale 2 puffs into the lungs daily. 60 each 1   ipratropium-albuterol (DUONEB) 0.5-2.5 (3) MG/3ML SOLN Inhale 3 mLs into the lungs every 6 (six) hours as needed. 360 mL 1   levothyroxine (SYNTHROID) 75 MCG tablet Take 1 tablet (75 mcg total) by mouth daily. 90 tablet 3   metoprolol succinate (TOPROL-XL) 25 MG 24 hr tablet Take 0.5 tablets (12.5 mg total) by mouth daily. Take with or immediately following a meal. 30 tablet 0   montelukast (SINGULAIR) 10 MG tablet Take 1 tablet (10 mg total) by mouth daily at 12 noon. 90 tablet 1   OXYGEN Inhale 3 L into the lungs as needed.     pantoprazole (PROTONIX) 40 MG tablet Take 1 tablet (40 mg total) by mouth daily. 30 tablet 0   spironolactone (ALDACTONE) 25 MG tablet Take 1 tablet (25 mg total) by mouth daily. 30 tablet 0   tamsulosin (FLOMAX) 0.4 MG CAPS capsule Take 1 capsule (0.4 mg total) by mouth daily. 30 capsule 0   torsemide (DEMADEX) 20 MG tablet Take 20 mg by mouth 2 (two) times daily.     Magnesium 400 MG CAPS Take 400 mg by mouth daily. (Patient not taking: Reported on 05/05/2022)     No  current facility-administered medications for this encounter.    Allergies  Allergen Reactions   Diltiazem     Other reaction(s): Unknown      Social History   Socioeconomic History   Marital status: Widowed    Spouse name: Not on file   Number of children: 3   Years of education: Not on file   Highest education level: High school graduate  Occupational History   Occupation: retired  Tobacco Use   Smoking status: Every Day    Packs/day: 1.00    Years: 62.00    Additional pack years: 0.00    Total pack years: 62.00    Types: Cigarettes, Cigars   Smokeless tobacco: Never  Vaping Use   Vaping Use: Never used  Substance and Sexual Activity   Alcohol use: Yes  Alcohol/week: 5.0 standard drinks of alcohol    Types: 5 Standard drinks or equivalent per week    Comment: 4-5 beers per day   Drug use: No   Sexual activity: Not Currently  Other Topics Concern   Not on file  Social History Narrative   Not on file   Social Determinants of Health   Financial Resource Strain: Low Risk  (04/26/2022)   Overall Financial Resource Strain (CARDIA)    Difficulty of Paying Living Expenses: Not very hard  Food Insecurity: No Food Insecurity (04/15/2022)   Hunger Vital Sign    Worried About Running Out of Food in the Last Year: Never true    Ran Out of Food in the Last Year: Never true  Transportation Needs: No Transportation Needs (04/26/2022)   PRAPARE - Administrator, Civil Service (Medical): No    Lack of Transportation (Non-Medical): No  Physical Activity: Inactive (04/15/2022)   Exercise Vital Sign    Days of Exercise per Week: 0 days    Minutes of Exercise per Session: 0 min  Stress: No Stress Concern Present (04/15/2022)   [employer] of Occupational Health - Occupational Stress Questionnaire    Feeling of Stress : Not at all  Social Connections: Moderately Isolated (04/15/2022)   Social Connection and Isolation Panel [NHANES]    Frequency of Communication  with Friends and Family: More than three times a week    Frequency of Social Gatherings with Friends and Family: More than three times a week    Attends Religious Services: More than 4 times per year    Active Member of [employer] or Organizations: No    Attends Banker Meetings: Never    Marital Status: Widowed  Intimate Partner Violence: Not At Risk (04/15/2022)   Humiliation, Afraid, Rape, and Kick questionnaire    Fear of Current or Ex-Partner: No    Emotionally Abused: No    Physically Abused: No    Sexually Abused: No      Family History  Problem Relation Age of Onset   Cancer Mother    Diabetes Mother    Breast cancer Sister     Vitals:   05/10/22 1024  BP: 120/60  Pulse: (!) 45  SpO2: 95%  Weight: 97.7 kg (215 lb 6.4 oz)    PHYSICAL EXAM: General:  Well appearing elderly male. HEENT: normal Neck: supple. no JVD. Carotids 2+ bilat; no bruits.  Cor: PMI nondisplaced. Regular rhythm, bradycardic. No rubs, gallops or murmurs. Lungs: diminished Abdomen: soft, nontender, nondistended.  Extremities: no cyanosis, clubbing, rash, edema Neuro: alert & oriented x 3. Affect pleasant. No focal deficits.  ECG: junctional rhythm 45 bpm   ASSESSMENT & PLAN: HFpEF -Echo 04/24 EF 60-65%, grade II DD, RV moderately enlarged with low normal function, moderate BAE, mild to moderate AS with mean gradient 17 mmHg -Fluid and sodium intake likely contributed to recent admit. This was discussed in detail. Also needs to cut back on ETOH. NYHA III, confounded by underlying pulmonary disease GDMT  Diuretic-Torsemide 20 mg BID. Not volume overloaded on exam. BB-Metoprolol xl 12.5 mg daily Ace/ARB/ARNI-Not yet MRA-Spiro 25 mg daily SGLT2i-Jardiance 10 mg daily -Check CMET today. K 5.6 on labs a few days ago. If high on today's recheck will need to stop spironolactone.  Paroxysmal atrial fibrillation -Junctional rhythm with rate 45 bpm on ECG today. Amiodarone had been  discontinued but was recently added back sometime in last couple of months. Currently on 200 mg BID.  Stop amiodarone. Can continue low-dose metoprolol 12.5 mg daily for now but may need to stop if remains bradycardic. -Continue Eliquis 5 mg BID -Reports more fatigue, weakness, dizziness, feeling "swimmy headed" the last couple of days. Numbness in hands also noted. Suspect symptoms may be due to bradycardia. No focal deficits on exam. Reports he has not missed any doses of Eliquis but does have history of noncompliance. Attempted to arrange MRI of head for today but he is severely claustrophobic. Will obtain CT head. Discussed with patient and son when to seek emergent evaluation for any neurological changes.  CAD -Nonobstructive CAD on cardiac cath in 2022 -On statin -No aspirin with long-term eliquis  HTN BP controlled. Meds as above  Chronic respiratory failure with hypoxia, on supplemental O2 PRN COPD Ongoing tobacco use -Recently established with Pulmonary -Not interested in smoking cessation  Alcohol abuse -Currently drinking 8-10 beers a day -Needs to cut back on ETOH  Aortic valve stenosis -Echo 04/24: EF preserved, Mild to moderate AS with mean gradient of 17 mmHg  Referred to HFSW (PCP, Medications, Transportation, ETOH Abuse, Drug Abuse, Insurance, Surveyor, quantity ): No Refer to Pharmacy: No Refer to Home Health: No Refer to Advanced Heart Failure Clinic: No Refer to General Cardiology: No, already established  Follow up: 2 weeks to reassess volume and compliance, referred back to Dr. Dulce Sellar for Cardiology follow-up

## 2022-05-10 NOTE — Patient Instructions (Addendum)
EKG done today.  Labs done today. We will contact you only if your labs are abnormal.  STOP taking Amiodarone.  No other medication changes were made. Please continue all current medications as prescribed.  Your physician recommends that you schedule a follow-up appointment in: 2 weeks   If you have any questions or concerns before your next appointment please send [place] a message through [place] or call our office at [phone].    TO LEAVE A MESSAGE FOR THE NURSE SELECT OPTION 2, PLEASE LEAVE A MESSAGE INCLUDING: YOUR NAME DATE OF BIRTH CALL BACK NUMBER REASON FOR CALL**this is important as we prioritize the call backs  YOU WILL RECEIVE A CALL BACK THE SAME DAY AS LONG AS YOU CALL BEFORE 4:00 PM   Do the following things EVERYDAY: Weigh yourself in the morning before breakfast. Write it down and keep it in a log. Take your medicines as prescribed Eat low salt foods--Limit salt (sodium) to 2000 mg per day.  Stay as active as you can everyday Limit all fluids for the day to less than 2 liters   At the [facility], you and your health needs are our priority. As part of our continuing mission to provide you with exceptional heart care, we have created designated Provider Care Teams. These Care Teams include your primary Cardiologist (physician) and Advanced Practice Providers (APPs- Physician Assistants and Nurse Practitioners) who all work together to provide you with the care you need, when you need it.   You may see any of the following providers on your designated Care Team at your next follow up: Dr Arvilla Meres Dr Marca Ancona Dr. Marcos Eke, NP Robbie Lis, [place] [facility] [place], [place] Brynda Peon, NP Karle Plumber, PharmD   Please be sure to bring in all your medications bottles to every appointment.    Thank you for choosing [facility]

## 2022-05-10 NOTE — Telephone Encounter (Signed)
Patient aware of scan results. Referral placed to Dr. Dulce Sellar

## 2022-05-12 ENCOUNTER — Other Ambulatory Visit: Payer: Medicare HMO | Admitting: Pharmacist

## 2022-05-12 NOTE — Progress Notes (Signed)
05/12/2022 Name: Kyle Farley MRN: [id number] DOB: [birth year]  Chief Complaint  Patient presents with   Medication Management    Kyle Farley is a 82 y.o. year old male who presented for a telephone visit.   They were referred to the pharmacist by their Case Management Team  for assistance in managing complex medication management.   Patient reports 1 week of swimmy headed, dizzy. HR 70s, using home finger pulse ox. Reviewed recent specialist visits - metoprolol XL  daily was lowered to half tablet daily on 05/10/22 but was not yet implemented at home.  Patient is out of montelukast & trelegy, requesting refills.   Patient reports he is down to 4-5 beers/day (was 8-10/day). Patient reports smoking 1.5-2ppd in 24hr. Not interested in quitting at this time.  Subjective:  Care Team: Primary Care Provider: Blane Ohara, MD  Medication Access/Adherence  Current Pharmacy:  [facility] [address] [place] [address] Phone: [phone] Fax: [phone]  [facility] [address] [place] [address] Phone: [phone] Fax: [phone]  [facility] [address], [place] - [address] [place] [address] Phone: [phone] Fax: [phone]  [facility] [address] [place] - [address] [place] [address] Phone: [phone] Fax: [phone]   Patient reports affordability concerns with their medications: No  Patient reports access/transportation concerns to their pharmacy: No  Patient reports adherence concerns with their medications:  No      Medication Management:  Current adherence strategy: Patient and son states he has a separate bag of pill bottles that are his once per day, and then the twice per day bottles are out on the counter. Patient does not like pill boxes, he finds they are more confusing. He  describes this system is working well for him.  Patient reports Good adherence to medications  Patient reports the following barriers to adherence: none at present    Objective:  No results found for: "HGBA1C"  Lab Results  Component Value Date   CREATININE 1.21 05/10/2022   BUN 19 05/10/2022   NA 130 (L) 05/10/2022   K 4.7 05/10/2022   CL 96 (L) 05/10/2022   CO2 26 05/10/2022    Lab Results  Component Value Date   CHOL 188 10/14/2021   HDL 92 10/14/2021   LDLCALC 80 10/14/2021   TRIG 93 10/14/2021   CHOLHDL 2.0 10/14/2021    Medications Reviewed Today     Reviewed by Gabriel Carina, RPH (Pharmacist) on 05/12/22 at 1356  Med List Status: <None>   Medication Order Taking? Sig Documenting Provider Last Dose Status Informant  albuterol (VENTOLIN HFA) 108 (90 Base) MCG/ACT inhaler [id number] Yes Inhale 2 puffs into the lungs every 4 (four) hours as needed for wheezing or shortness of breath. [name], Kirsten, MD Taking Active   apixaban (ELIQUIS) 5 MG TABS tablet [id number] Yes Take 1 tablet (5 mg total) by mouth 2 (two) times daily. Baldo Daub, MD Taking Active Self, Pharmacy Records  atorvastatin (LIPITOR) 40 MG tablet [id number] Yes Take 1 tablet (40 mg total) by mouth daily. Gaston Islam., NP Taking Active Self, Pharmacy Records  diphenhydrAMINE HCl, Sleep, (ZZZQUIL PO) [id number] Yes Take 1 tablet by mouth at bedtime. [provider] Taking Active Self, Pharmacy Records  empagliflozin (JARDIANCE) 10 MG TABS tablet [id number] Yes Take 1 tablet (10 mg total) by mouth  daily. Zannie Cove, MD Taking Active   fluticasone furoate-vilanterol (BREO ELLIPTA) 200-25 MCG/ACT AEPB [id number] No Inhale 1 puff into the lungs daily.  Patient not taking: Reported on 05/12/2022   [provider] Unknown Active   Fluticasone-Umeclidin-Vilant Harrel Carina ELLIPTA) 200-62.5-25 MCG/ACT AEPB [id number] Yes Inhale 2 puffs into the lungs daily. Lurline Del, FNP Taking Active  Self, Pharmacy Records  ipratropium-albuterol (DUONEB) 0.5-2.5 (3) MG/3ML SOLN [id number] Yes Inhale 3 mLs into the lungs every 6 (six) hours as needed. [name], Kirsten, MD Taking Active Self, Pharmacy Records  levothyroxine (SYNTHROID) 75 MCG tablet [id number] Yes Take 1 tablet (75 mcg total) by mouth daily. Lurline Del, FNP Taking Active Self, Pharmacy Records  Magnesium 400 MG CAPS [id number] Yes Take 400 mg by mouth daily. [provider] Taking Active Self, Pharmacy Records  metoprolol succinate (TOPROL-XL) 25 MG 24 hr tablet [id number] Yes Take 0.5 tablets (12.5 mg total) by mouth daily. Take with or immediately following a meal. Zannie Cove, MD Taking Active   montelukast (SINGULAIR) 10 MG tablet [id number] Yes Take 1 tablet (10 mg total) by mouth daily at 12 noon. Blane Ohara, MD Taking Active Self, Pharmacy Records  OXYGEN [id number] Yes Inhale 3 L into the lungs as needed. [provider] Taking Active   pantoprazole (PROTONIX) 40 MG tablet [id number] Yes Take 1 tablet (40 mg total) by mouth daily. Zannie Cove, MD Taking Active   spironolactone (ALDACTONE) 25 MG tablet [id number] Yes Take 1 tablet (25 mg total) by mouth daily. Zannie Cove, MD Taking Active   tamsulosin [facility]) 0.4 MG CAPS capsule [id number] Yes Take 1 capsule (0.4 mg total) by mouth daily. Zannie Cove, MD Taking Active   torsemide [facility]) 20 MG tablet [id number] Yes Take 20 mg by mouth 2 (two) times daily. [provider] Taking Active               Assessment/Plan:   Medication Management: - Currently strategy sufficient to maintain appropriate adherence to prescribed medication regimen - Resolved metoprolol dosing error - instructed patient and son to cut metoprolol tablets in half to take HALF tablet daily instead of full tablet.  - Advised patient & son to contact [facility] (dispensing pharmacy) for montelukast and trelegy as refills are available. If needing refills, contact  PCP office to request. - Will touch base briefly in 2 days to ensure there is a plan for medication access, especially to maintenance inhaler.    Follow Up Plan: 2 days  Lynnda Shields, PharmD, BCPS Clinical Pharmacist [facility]

## 2022-05-13 NOTE — Discharge Summary (Addendum)
Physician Discharge Summary  Kyle Farley ZOX:[id number] DOB: 07-07-1940 DOA: 04/24/2022  PCP: Blane Ohara, MD  Admit date: 04/24/2022 Discharge date: 04/30/2022  Time spent: 35 minutes  Recommendations for Outpatient Follow-up:  [facility] heart failure clinic on 4/22 PCP in 1 week, please check BMP at follow-up   Discharge Diagnoses:  Principal Problem:   Acute on chronic diastolic heart failure Active Problems:   Paroxysmal atrial fibrillation   Essential hypertension   Hyponatremia   Acquired hypothyroidism   Anemia of chronic disease   Hyperlipidemia   Class 1 obesity   GERD (gastroesophageal reflux disease)   Discharge Condition: Improved  Diet recommendation: Low-sodium heart healthy  Filed Weights   04/28/22 0509 04/29/22 0537 04/30/22 0502  Weight: 97 kg 93.2 kg 91.4 kg    History of present illness:  81/M with history of chronic diastolic CHF, COPD, paroxysmal A-fib, hypertension, dyslipidemia, hypothyroidism presented to the ED 4/6 with progressive shortness of breath, orthopnea and chest pain.  In the ED was bradycardic in the 30s on arrival, -Lower extremity edema, chest x-ray noted mild pulmonary vascular congestion. Echocardiogram 4/7 noted EF of 60-65% with mild LVH and grade 2 diastolic dysfunction -Improving on diuretics as well as treatment for COPD exacerbation  Hospital Course:  Acute on chronic diastolic heart failure -Echo with EF 60 to 65%,mild LVH, RV -low normal, mild to moderate aortic stenosis.  -Diuresed well with IV Lasix, he is 12.5 L negative, creatinine has trended up to 1.5 today, held losartan -At discharge she will continue torsemide, Jardiance, Aldactone and metoprolol    COPD exacerbation -Improving, transitioned to oral prednisone, DuoNebs, flutter valve, incentive spirometry -Smoking cessation counseling.    Paroxysmal atrial fibrillation Continue metoprolol, amiodarone and apixaban   Essential hypertension -Stable continue  metoprolol, losartan   Acquired hypothyroidism Continue levothyroxine.   Anemia of chronic disease Hgb stable.    Hyperlipidemia Continue with atorvastatin.    Class 1 obesity Calculated BMI 32.7    Hyponatremia -Improved     Discharge Exam: Vitals:   04/30/22 0738 04/30/22 0740  BP:  (!) 156/64  Pulse:  68  Resp:  20  Temp:  98 F (36.7 C)  SpO2: 99% 99%   Gen: Awake, Alert, Oriented X 3,  HEENT: no JVD Lungs: Good air movement bilaterally, CTAB CVS: S1S2/RRR Abd: soft, Non tender, non distended, BS present Extremities: No edema Skin: no new rashes on exposed skin   Discharge Instructions   Discharge Instructions     Diet - low sodium heart healthy   Complete by: As directed    Increase activity slowly   Complete by: As directed       Allergies as of 04/30/2022       Reactions   Diltiazem    Other reaction(s): Unknown        Medication List     STOP taking these medications    amLODipine 5 MG tablet Commonly known as: NORVASC   lisinopril 40 MG tablet Commonly known as: ZESTRIL       TAKE these medications    apixaban 5 MG Tabs tablet Commonly known as: Eliquis Take 1 tablet (5 mg total) by mouth 2 (two) times daily.   atorvastatin 40 MG tablet Commonly known as: LIPITOR Take 1 tablet (40 mg total) by mouth daily.   ipratropium-albuterol 0.5-2.5 (3) MG/3ML Soln Commonly known as: DUONEB Inhale 3 mLs into the lungs every 6 (six) hours as needed.   Jardiance 10 MG Tabs tablet Generic drug:  empagliflozin Take 1 tablet (10 mg total) by mouth daily.   levothyroxine 75 MCG tablet Commonly known as: SYNTHROID Take 1 tablet (75 mcg total) by mouth daily.   Magnesium 400 MG Caps Take 400 mg by mouth daily.   metoprolol succinate 25 MG 24 hr tablet Commonly known as: TOPROL-XL Take 0.5 tablets (12.5 mg total) by mouth daily. Take with or immediately following a meal. What changed:  medication strength how much to take    montelukast 10 MG tablet Commonly known as: SINGULAIR Take 1 tablet (10 mg total) by mouth daily at 12 noon.   pantoprazole 40 MG tablet Commonly known as: PROTONIX Take 1 tablet (40 mg total) by mouth daily.   spironolactone 25 MG tablet Commonly known as: ALDACTONE Take 1 tablet (25 mg total) by mouth daily.   tamsulosin 0.4 MG Caps capsule Commonly known as: FLOMAX Take 1 capsule (0.4 mg total) by mouth daily.   Trelegy Ellipta 200-62.5-25 MCG/ACT Aepb Generic drug: Fluticasone-Umeclidin-Vilant Inhale 2 puffs into the lungs daily.   Torsemide 20 mg tabs Take 1 tablet by mouth at twice a day.       Allergies  Allergen Reactions   Diltiazem     Other reaction(s): Unknown    Follow-up Information     [facility]. Go in 12 day(s).   Specialty: Cardiology Why: Hospital follow up 05/10/2022 @ 11 am PLEASE bring a current medication list to appointment FREE valet parking, Entrance C, off [employer] information: [address] [id number] mc [place] [address] [phone]        [name]. Go on 05/05/2022.   Why: @9 :20am Contact information: [address], [address] [place] [address] [phone]                 The results of significant diagnostics from this hospitalization (including imaging, microbiology, ancillary and laboratory) are listed below for reference.    Significant Diagnostic Studies: CT HEAD WO CONTRAST ( )  Result Date: 05/10/2022 CLINICAL DATA:  Acute dizziness, vision changes EXAM: CT HEAD WITHOUT CONTRAST TECHNIQUE: Contiguous axial images were obtained from the base of the skull through the vertex without intravenous contrast. RADIATION DOSE REDUCTION: This exam was performed according to the departmental dose-optimization program which includes automated exposure control, adjustment of the mA and/or kV according to patient size  and/or use of iterative reconstruction technique. COMPARISON:  07/02/2021 FINDINGS: Brain: No acute intracranial findings are seen. There are no signs of bleeding within the cranium. Cortical sulci are prominent. There is a decreased density in periventricular and subcortical white matter. Vascular: Unremarkable. Skull: No acute findings are seen. Sinuses/Orbits: There are no air-fluid levels in visualized paranasal sinuses. There is mild mucosal thickening in ethmoid sinus. There is fluid density in all of the right mastoid air cells. Similar finding was seen in the previous study. Left mastoid is unremarkable. Other: None. IMPRESSION: No acute intracranial findings are seen. Atrophy. Small-vessel disease. Chronic right mastoid effusion. Electronically Signed   By: Ernie Avena M.D.   On: 05/10/2022 14:08   ECHOCARDIOGRAM COMPLETE  Result Date: 04/25/2022    ECHOCARDIOGRAM REPORT   Patient Name:   MAURIZIO GENO Date of Exam: 04/25/2022 Medical Rec #:  [id number]       Height:       69.0 in Accession #:    [id number]      Weight:       222.0 lb Date  of Birth:  10/05/1940        BSA:          2.160 m Patient Age:    81 years        BP:           124/93 mmHg Patient Gender: M               HR:           56 bpm. Exam Location:  Inpatient Procedure: 2D Echo, Color Doppler, Cardiac Doppler and Intracardiac            Opacification Agent Indications:     I50.31 Acute diastolic (congestive) heart failure  History:         Patient has prior history of Echocardiogram examinations, most                  recent 06/19/2021. CHF, COPD, Arrythmias:Atrial Fibrillation;                  Risk Factors:Hypertension and Dyslipidemia.  Sonographer:     Irving Burton Senior RDCS Referring Phys:  Angie Fava Diagnosing Phys: Thurmon Fair MD  Sonographer Comments: Technically difficult due to lung interference. IMPRESSIONS  1. Left ventricular ejection fraction, by estimation, is 60 to 65%. The left ventricle has normal function. The  left ventricle has no regional wall motion abnormalities. There is mild concentric left ventricular hypertrophy. Left ventricular diastolic parameters are consistent with Grade II diastolic dysfunction (pseudonormalization).  2. Right ventricular systolic function is low normal. The right ventricular size is moderately enlarged.  3. Left atrial size was moderately dilated.  4. Right atrial size was moderately dilated.  5. The mitral valve is degenerative. Mild mitral valve regurgitation.  6. The aortic valve is calcified. There is mild calcification of the aortic valve. There is moderate thickening of the aortic valve. Aortic valve regurgitation is trivial. Mild to moderate aortic valve stenosis. Aortic valve mean gradient measures 17.0 mmHg. Aortic valve Vmax measures 2.82 m/s.  7. The inferior vena cava is normal in size with greater than 50% respiratory variability, suggesting right atrial pressure of 3 mmHg. Comparison(s): Prior images reviewed side by side. The left ventricular function has improved. Aortic stenosis is now present. FINDINGS  Left Ventricle: Left ventricular ejection fraction, by estimation, is 60 to 65%. The left ventricle has normal function. The left ventricle has no regional wall motion abnormalities. Definity contrast agent was given IV to delineate the left ventricular  endocardial borders. The left ventricular internal cavity size was normal in size. There is mild concentric left ventricular hypertrophy. Left ventricular diastolic parameters are consistent with Grade II diastolic dysfunction (pseudonormalization). Right Ventricle: The right ventricular size is moderately enlarged. Right vetricular wall thickness was not well visualized. Right ventricular systolic function is low normal. Left Atrium: Left atrial size was moderately dilated. Right Atrium: Right atrial size was moderately dilated. Pericardium: There is no evidence of pericardial effusion. Presence of epicardial fat layer.  Mitral Valve: The mitral valve is degenerative in appearance. Mild mitral annular calcification. Mild mitral valve regurgitation. Tricuspid Valve: The tricuspid valve is grossly normal. Tricuspid valve regurgitation is trivial. Aortic Valve: The aortic valve is calcified. There is mild calcification of the aortic valve. There is moderate thickening of the aortic valve. Aortic valve regurgitation is trivial. Mild to moderate aortic stenosis is present. Aortic valve mean gradient  measures 17.0 mmHg. Aortic valve peak gradient measures 31.8 mmHg. Aortic valve area, by VTI measures 1.53  cm. Pulmonic Valve: The pulmonic valve was grossly normal. Pulmonic valve regurgitation is not visualized. Aorta: The aortic root and ascending aorta are structurally normal, with no evidence of dilitation. Venous: The inferior vena cava is normal in size with greater than 50% respiratory variability, suggesting right atrial pressure of 3 mmHg. IAS/Shunts: No atrial level shunt detected by color flow Doppler.  LEFT VENTRICLE PLAX 2D LVIDd:         4.00 cm   Diastology LVIDs:         2.60 cm   LV e' medial:    6.53 cm/s LV PW:         1.30 cm   LV E/e' medial:  12.5 LV IVS:        1.10 cm   LV e' lateral:   7.83 cm/s LVOT diam:     2.30 cm   LV E/e' lateral: 10.4 LV SV:         107 LV SV Index:   50 LVOT Area:     4.15 cm  RIGHT VENTRICLE RV S prime:     11.10 cm/s TAPSE (M-mode): 2.3 cm LEFT ATRIUM             Index        RIGHT ATRIUM           Index LA diam:        3.80 cm 1.76 cm/m   RA Area:     28.00 cm LA Vol (A2C):   74.9 ml 34.68 ml/m  RA Volume:   94.80 ml  43.89 ml/m LA Vol (A4C):   88.9 ml 41.16 ml/m LA Biplane Vol: 92.3 ml 42.74 ml/m  AORTIC VALVE AV Area (Vmax):    1.89 cm AV Area (Vmean):   1.63 cm AV Area (VTI):     1.53 cm AV Vmax:           282.00 cm/s AV Vmean:          195.000 cm/s AV VTI:            0.699 m AV Peak Grad:      31.8 mmHg AV Mean Grad:      17.0 mmHg LVOT Vmax:         128.00 cm/s LVOT Vmean:         76.600 cm/s LVOT VTI:          0.258 m LVOT/AV VTI ratio: 0.37  AORTA Ao Root diam: 3.30 cm Ao Asc diam:  3.30 cm MITRAL VALVE MV Area (PHT): 3.21 cm    SHUNTS MV Decel Time: 236 msec    Systemic VTI:  0.26 m MV E velocity: 81.70 cm/s  Systemic Diam: 2.30 cm MV A velocity: 84.90 cm/s MV E/A ratio:  0.96 Mihai Croitoru MD Electronically signed by Thurmon Fair MD Signature Date/Time: 04/25/2022/3:27:20 PM    Final (Updated)    DG Chest Port 1 View  Result Date: 04/24/2022 CLINICAL DATA:  Shortness of breath EXAM: PORTABLE CHEST 1 VIEW COMPARISON:  03/10/2022 FINDINGS: Cardiac shadow is enlarged but accentuated by the portable technique. Aortic calcifications are seen. Mild central vascular congestion is noted without edema. No focal infiltrate is noted. No bony abnormality is seen. IMPRESSION: Mild central vascular congestion without focal edema. Electronically Signed   By: Alcide Clever M.D.   On: 04/24/2022 23:29    Microbiology: No results found for this or any previous visit (from the past 240 hour(s)).   Labs: Basic Metabolic Panel: Recent  Labs  Lab 05/10/22 1250  NA 130*  K 4.7  CL 96*  CO2 26  GLUCOSE 116*  BUN 19  CREATININE 1.21  CALCIUM 8.8*   Liver Function Tests: Recent Labs  Lab 05/10/22 1250  AST 29  ALT 42  ALKPHOS 65  BILITOT 0.4  PROT 6.6  ALBUMIN 3.2*   No results for input(s): "LIPASE", "AMYLASE" in the last 168 hours. No results for input(s): "AMMONIA" in the last 168 hours. CBC: No results for input(s): "WBC", "NEUTROABS", "HGB", "HCT", "MCV", "PLT" in the last 168 hours. Cardiac Enzymes: No results for input(s): "CKTOTAL", "CKMB", "CKMBINDEX", "TROPONINI" in the last 168 hours. BNP: BNP (last 3 results) Recent Labs    04/24/22 2256  BNP 409.6*    ProBNP (last 3 results) Recent Labs    12/22/21 1149 01/20/22 0000 01/28/22 1059  PROBNP 639* CANCELED 379    CBG: No results for input(s): "GLUCAP" in the last 168  hours.     Signed:  Zannie Cove MD.  [facility] 05/13/2022, 3:30 PM

## 2022-05-14 ENCOUNTER — Other Ambulatory Visit: Payer: Self-pay

## 2022-05-14 ENCOUNTER — Telehealth: Payer: Self-pay | Admitting: Pharmacist

## 2022-05-14 ENCOUNTER — Other Ambulatory Visit: Payer: Medicare HMO | Admitting: Pharmacist

## 2022-05-14 NOTE — Progress Notes (Signed)
Attempted to contact patient x2 for scheduled appointment for medication management. Left HIPAA compliant message for patient to return my call at their convenience.   Keshona Kartes, PharmD, BCPS Clinical Pharmacist [facility]  

## 2022-05-14 NOTE — Progress Notes (Unsigned)
05/14/2022 Name: Kyle Farley MRN: [id number] DOB: 08-24-1940  No chief complaint on file.   Kyle Farley is a 82 y.o. year old male who presented for a telephone visit.   They were referred to the pharmacist by their Case Management Team  for assistance in managing complex medication management.   Patient reports 1 week of swimmy headed, dizzy. HR 70s, using home finger pulse ox. Reviewed recent specialist visits - metoprolol XL 25mg  daily was lowered to half tablet daily on 05/10/22 but was not yet implemented at home.  Patient is out of montelukast & trelegy, requesting refills.   Patient reports he is down to 4-5 beers/day (was 8-10/day). Patient reports smoking 1.5-2ppd in 24hr. Not interested in quitting at this time.  Subjective:  Care Team: Primary Care Provider: Blane Ohara, MD  Medication Access/Adherence  Current Pharmacy:  [facility] [address] [place] [address] Phone: [phone] Fax: [phone]  [facility] [address] [place] [address] Phone: [phone] Fax: [phone]  [facility] [address], [place] - [address] [place] [address] Phone: [phone] Fax: [phone]  [facility] [address], [place] - [address] [place] [address] Phone: [phone] Fax: [phone]   Patient reports affordability concerns with their medications: No  Patient reports access/transportation concerns to their pharmacy: No  Patient reports adherence concerns with their medications:  No      Medication Management:  Current adherence strategy: Patient and son states he has a separate bag of pill bottles that are his once per day, and then the twice per day bottles are out on the counter. Patient does not like pill boxes, he finds they are more confusing. He describes this system is working well for  him.  Patient reports Good adherence to medications  Patient reports the following barriers to adherence: none at present    Objective:  No results found for: "HGBA1C"  Lab Results  Component Value Date   CREATININE 1.21 05/10/2022   BUN 19 05/10/2022   NA 130 (L) 05/10/2022   K 4.7 05/10/2022   CL 96 (L) 05/10/2022   CO2 26 05/10/2022    Lab Results  Component Value Date   CHOL 188 10/14/2021   HDL 92 10/14/2021   LDLCALC 80 10/14/2021   TRIG 93 10/14/2021   CHOLHDL 2.0 10/14/2021    Medications Reviewed Today     Reviewed by Gabriel Carina, RPH (Pharmacist) on 05/12/22 at 1356  Med List Status: <None>   Medication Order Taking? Sig Documenting Provider Last Dose Status Informant  albuterol (VENTOLIN HFA) 108 (90 Base) MCG/ACT inhaler [id number] Yes Inhale 2 puffs into the lungs every 4 (four) hours as needed for wheezing or shortness of breath. [name], Kirsten, MD Taking Active   apixaban (ELIQUIS) 5 MG TABS tablet [id number] Yes Take 1 tablet (5 mg total) by mouth 2 (two) times daily. Baldo Daub, MD Taking Active Self, Pharmacy Records  atorvastatin (LIPITOR) 40 MG tablet [id number] Yes Take 1 tablet (40 mg total) by mouth daily. Gaston Islam., NP Taking Active Self, Pharmacy Records  diphenhydrAMINE HCl, Sleep, (ZZZQUIL PO) [id number] Yes Take 1 tablet by mouth at bedtime. [provider] Taking Active Self, Pharmacy Records  empagliflozin (JARDIANCE) 10 MG TABS tablet [id number] Yes Take 1 tablet (10 mg total) by mouth daily. Zannie Cove, MD Taking Active  fluticasone furoate-vilanterol (BREO ELLIPTA) 200-25 MCG/ACT AEPB [id number] No Inhale 1 puff into the lungs daily.  Patient not taking: Reported on 05/12/2022   [provider] Unknown Active   Fluticasone-Umeclidin-Vilant Harrel Carina ELLIPTA) 200-62.5-25 MCG/ACT AEPB [id number] Yes Inhale 2 puffs into the lungs daily. Lurline Del, FNP Taking Active Self, Pharmacy Records   ipratropium-albuterol (DUONEB) 0.5-2.5 (3) MG/3ML SOLN [id number] Yes Inhale 3 mLs into the lungs every 6 (six) hours as needed. [name], Kirsten, MD Taking Active Self, Pharmacy Records  levothyroxine (SYNTHROID) 75 MCG tablet [id number] Yes Take 1 tablet (75 mcg total) by mouth daily. Lurline Del, FNP Taking Active Self, Pharmacy Records  Magnesium 400 MG CAPS [id number] Yes Take 400 mg by mouth daily. [provider] Taking Active Self, Pharmacy Records  metoprolol succinate (TOPROL-XL) 25 MG 24 hr tablet [id number] Yes Take 0.5 tablets (12.5 mg total) by mouth daily. Take with or immediately following a meal. Zannie Cove, MD Taking Active   montelukast (SINGULAIR) 10 MG tablet [id number] Yes Take 1 tablet (10 mg total) by mouth daily at 12 noon. Blane Ohara, MD Taking Active Self, Pharmacy Records  OXYGEN [id number] Yes Inhale 3 L into the lungs as needed. [provider] Taking Active   pantoprazole (PROTONIX) 40 MG tablet [id number] Yes Take 1 tablet (40 mg total) by mouth daily. Zannie Cove, MD Taking Active   spironolactone (ALDACTONE) 25 MG tablet [id number] Yes Take 1 tablet (25 mg total) by mouth daily. Zannie Cove, MD Taking Active   tamsulosin [facility]) 0.4 MG CAPS capsule [id number] Yes Take 1 capsule (0.4 mg total) by mouth daily. Zannie Cove, MD Taking Active   torsemide [facility]) 20 MG tablet [id number] Yes Take 20 mg by mouth 2 (two) times daily. [provider] Taking Active               Assessment/Plan:   Medication Management: - Currently strategy sufficient to maintain appropriate adherence to prescribed medication regimen - Resolved metoprolol dosing error - instructed patient and son to cut metoprolol tablets in half to take HALF tablet daily instead of full tablet.  - Advised patient & son to contact [facility] (dispensing pharmacy) for montelukast and trelegy as refills are available. If needing refills, contact PCP office to request. -  Will touch base briefly in 2 days to ensure there is a plan for medication access, especially to maintenance inhaler.    Follow Up Plan: 2 days  Lynnda Shields, PharmD, BCPS Clinical Pharmacist [facility]

## 2022-05-17 ENCOUNTER — Other Ambulatory Visit: Payer: Medicare HMO | Admitting: Pharmacist

## 2022-05-17 ENCOUNTER — Telehealth: Payer: Self-pay | Admitting: Pharmacist

## 2022-05-17 DIAGNOSIS — J449 Chronic obstructive pulmonary disease, unspecified: Secondary | ICD-10-CM | POA: Diagnosis not present

## 2022-05-17 NOTE — Progress Notes (Signed)
Attempted to contact patient x2 for scheduled appointment for medication management. Left HIPAA compliant message for patient to return my call at their convenience.   Tieara Flitton, PharmD, BCPS Clinical Pharmacist [facility]  

## 2022-05-18 ENCOUNTER — Telehealth: Payer: Self-pay

## 2022-05-18 ENCOUNTER — Other Ambulatory Visit: Payer: Medicare HMO

## 2022-05-18 DIAGNOSIS — Z7951 Long term (current) use of inhaled steroids: Secondary | ICD-10-CM

## 2022-05-18 DIAGNOSIS — I5023 Acute on chronic systolic (congestive) heart failure: Secondary | ICD-10-CM

## 2022-05-18 DIAGNOSIS — I480 Paroxysmal atrial fibrillation: Secondary | ICD-10-CM

## 2022-05-18 DIAGNOSIS — J449 Chronic obstructive pulmonary disease, unspecified: Secondary | ICD-10-CM

## 2022-05-18 NOTE — Progress Notes (Signed)
   Care Guide Note  05/18/2022 Name: Kyle Farley MRN: [id number] DOB: 11-05-1940  Referred by: Blane Ohara, MD Reason for referral : Care Coordination (Outreach to reschedule with Pharm d )   Kyle Farley is a 82 y.o. year old male who is a primary care patient of [name], Kirsten, MD. [name] was referred to the pharmacist for assistance related to HTN.    An unsuccessful telephone outreach was attempted today to contact the patient who was referred to the pharmacy team for assistance with medication management. Additional attempts will be made to contact the patient.   Penne Lash, RMA Care Guide [facility]  [place], [place] [address] Direct Dial: [phone] [email or web address]

## 2022-05-19 DIAGNOSIS — J449 Chronic obstructive pulmonary disease, unspecified: Secondary | ICD-10-CM | POA: Diagnosis not present

## 2022-05-19 NOTE — Progress Notes (Signed)
 " Cardiology Office Note:    Date:  05/20/2022   ID:  JHAMARI MARKOWICZ, DOB [birth year], MRN [id number]  PCP:  Sherre Clapper, MD   [facility] Providers Cardiologist:  None     Referring MD: Colletta Manuelita Garre, *   CC: hospital follow up   History of Present Illness:    Kyle Farley is a 82 y.o. male with a hx of paroxysmal atrial fibrillation, non-obstructive CAD, hypertension, CHF, AAA, PAD, COPD O2 HS, GERD, acquired hypothyroidism, history of alcohol abuse, BPH, hyperlipidemia.  He is an established patient with Dr. Revankar at least since 2016 for the management of PAF, hypertension.  Echo in August 2022 revealed an EF of 40 to 45%, global hypokinesis, diastolic parameters were indeterminate, left atrium moderately dilated, mild MR.  LHC on 09/15/2020 revealed nonobstructive CAD with mild nonobstructive plaquing in the LAD, left circumflex, moderate calcific stenosis of the RCA ostium.  Repeat echo in May 2023 revealed an EF of 50 to 55%, severely dilated left atrium, mildly enlarged right atrium, trace to mild AR, mild aortic stenosis, mild MR.  He was admitted on 04/24/2022 to 04/30/2022 for acute on chronic diastolic heart failure, after complaints of progressive shortness of breath, orthopnea and chest pain.  He was bradycardic in the emergency department with his heart rate in the 30s, pedal edema, chest x-ray revealed mild pulmonary vascular congestion.  He was diuresed 12.5 L, creatinine trended up to 1.5, his losartan  was held.  Echo at this time revealed EF 60 to 65%, mild concentric LVH, grade 2 DD, moderate dilatation of right and left atrium, mild MR, mild calcification of the aortic valve, moderate thickening of the aortic valve, mild to moderate aortic valve stenosis.  Since his discharge he has been evaluated by his PCP on 05/05/2022, he had been requiring oxygen  since discharge.  He was evaluated by heart failure TOC on 05/10/2022, said he had not felt well for the  previous 2 days, increased fatigue and weakness.  His amiodarone  was stopped at this visit due to junctional rhythm, heart rate 45 bpm.  Low-dose metoprolol  was continued.  Head CT was arranged due to his sudden onset of dizziness however it was negative for any intracranial findings.  Blood work at this time revealed a sodium of 130, albumin  3.2.  He presents today accompanied by son for hospital follow-up.  States he has been doing  okay since he was discharged.  He is weighing himself daily, weights are consistently between 208 and 210.  He is taking his Demadex  sporadically, as it is inconvenient for him on the days that he has to travel places.  He has been out of his Eliquis  for approximately 1 week.  We discussed fluid restriction, the need to adhere to 64 ounces of total fluid per day.  He states he is currently drinking five 12 ounce beers per day, one 10 ounce container of Gatorade, 2 cups of coffee.  We discussed that he is consuming too many fluids, particularly alcohol, encouraged cessation however he is not inclined to do so.  He is spoken 1-2 PPD, is not interested in cessation.  He states that he does have dyspnea with exertion, orthopnea--although he has been sleeping in a recliner for many years it is hard for him to see if this is worse or better than normal, and pedal edema.  He denies chest pain, palpitations, pnd, n, v, dizziness, syncope,weight gain, or early satiety.   Past Medical History:  Diagnosis Date   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 (HCC) 11/10/2021   Acquired hypothyroidism 11/10/2021   Acute hypoxic respiratory failure (HCC) 04/25/2022   Acute on chronic diastolic heart failure (HCC) 04/25/2022   Acute on chronic systolic heart failure (HCC) 11/10/2021   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/10/2021   Alcohol use 09/12/2020   Anemia of chronic disease 04/25/2022   Arthritis    Atherosclerosis of native arteries of extremities with  intermittent claudication, bilateral legs (HCC) 11/10/2021   Bilateral leg edema 02/16/2022   BPH (benign prostatic hyperplasia) 11/10/2021   CHF (congestive heart failure) (HCC)    Chronic hyponatremia 04/25/2022   Chronic respiratory failure with hypoxia (HCC) 05/09/2022   Cigarette smoker 10/21/2014   Class 1 obesity 04/25/2022   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)    Dyspnea    Dysrhythmia    atrial fibrillation   Encounter for prostate cancer screening 02/16/2022   Encounter for screening for lung cancer 02/16/2022   Essential hypertension 10/21/2014   Femur fracture, right (HCC) 09/12/2020   GERD (gastroesophageal reflux disease)    History of kidney stones    Hyperlipidemia 01/24/2017   Hypertension    Hyponatremia 04/26/2022   Kidney stones 01/24/2017   Paroxysmal atrial fibrillation (HCC) 10/21/2014   Pressure injury of skin 09/16/2020   SOB (shortness of breath) 05/10/2022   Tobacco abuse 02/16/2022    Past Surgical History:  Procedure Laterality Date   LEFT HEART CATH AND CORONARY ANGIOGRAPHY N/A 09/15/2020   Procedure: LEFT HEART CATH AND CORONARY ANGIOGRAPHY;  Surgeon: Wonda Sharper, MD;  Location: [facility] INVASIVE CV LAB;  Service: Cardiovascular;  Laterality: N/A;   LEG SURGERY Right    steal pin placed in right leg 35 years ago   ORIF FEMUR FRACTURE Right 09/15/2020   Procedure: OPEN REDUCTION INTERNAL FIXATION FEMORAL SHAFT FRACTURE;  Surgeon: Kendal Franky SQUIBB, MD;  Location: [facility] OR;  Service: Orthopedics;  Laterality: Right;    Current Medications: Current Meds  Medication Sig   albuterol  (VENTOLIN  HFA) 108 (90 Base) MCG/ACT inhaler Inhale 2 puffs into the lungs every 4 (four) hours as needed for wheezing or shortness of breath.   atorvastatin  (LIPITOR) 40 MG tablet Take 1 tablet (40 mg total) by mouth daily.   diphenhydrAMINE HCl, Sleep, (ZZZQUIL PO) Take 1 tablet by mouth at bedtime.   empagliflozin  (JARDIANCE ) 10 MG TABS tablet Take 1 tablet (10  mg total) by mouth daily.   Fluticasone -Umeclidin-Vilant (TRELEGY ELLIPTA ) 200-62.5-25 MCG/ACT AEPB Inhale 2 puffs into the lungs daily.   ipratropium-albuterol  (DUONEB) 0.5-2.5 (3) MG/3ML SOLN Inhale 3 mLs into the lungs every 6 (six) hours as needed.   levothyroxine  (SYNTHROID ) 50 MCG tablet Take 50 mcg by mouth daily before breakfast.   metoprolol  succinate (TOPROL -XL) 25 MG 24 hr tablet Take 12.5 mg by mouth daily.   montelukast  (SINGULAIR ) 10 MG tablet Take 1 tablet (10 mg total) by mouth daily at 12 noon.   OXYGEN  Inhale 3 L into the lungs as needed (shortness of breath).   pantoprazole  (PROTONIX ) 40 MG tablet Take 1 tablet (40 mg total) by mouth daily.   spironolactone  (ALDACTONE ) 25 MG tablet Take 1 tablet (25 mg total) by mouth daily.   tamsulosin  (FLOMAX ) 0.4 MG CAPS capsule Take 1 capsule (0.4 mg total) by mouth daily.   torsemide  (DEMADEX ) 20 MG tablet Take 20 mg by mouth 2 (two) times daily.     Allergies:   Patient has no active allergies.  Social History   Socioeconomic History   Marital status: Widowed    Spouse name: Not on file   Number of children: 3   Years of education: Not on file   Highest education level: High school graduate  Occupational History   Occupation: retired  Tobacco Use   Smoking status: Every Day    Packs/day: 1.00    Years: 62.00    Additional pack years: 0.00    Total pack years: 62.00    Types: Cigarettes, Cigars   Smokeless tobacco: Never  Vaping Use   Vaping Use: Never used  Substance and Sexual Activity   Alcohol use: Yes    Alcohol/week: 5.0 standard drinks of alcohol    Types: 5 Standard drinks or equivalent per week    Comment: 4-5 beers per day   Drug use: No   Sexual activity: Not Currently  Other Topics Concern   Not on file  Social History Narrative   Not on file   Social Determinants of Health   Financial Resource Strain: Low Risk  (04/26/2022)   Overall Financial Resource Strain (CARDIA)    Difficulty of Paying  Living Expenses: Not very hard  Food Insecurity: No Food Insecurity (04/15/2022)   Hunger Vital Sign    Worried About Running Out of Food in the Last Year: Never true    Ran Out of Food in the Last Year: Never true  Transportation Needs: No Transportation Needs (04/26/2022)   PRAPARE - Administrator, Civil Service (Medical): No    Lack of Transportation (Non-Medical): No  Physical Activity: Inactive (04/15/2022)   Exercise Vital Sign    Days of Exercise per Week: 0 days    Minutes of Exercise per Session: 0 min  Stress: No Stress Concern Present (04/15/2022)   [employer] of Occupational Health - Occupational Stress Questionnaire    Feeling of Stress : Not at all  Social Connections: Moderately Isolated (04/15/2022)   Social Connection and Isolation Panel [NHANES]    Frequency of Communication with Friends and Family: More than three times a week    Frequency of Social Gatherings with Friends and Family: More than three times a week    Attends Religious Services: More than 4 times per year    Active Member of [employer] or Organizations: No    Attends Banker Meetings: Never    Marital Status: Widowed     Family History: The patient's family history includes Breast cancer in his sister; Cancer in his mother; Diabetes in his mother.  ROS:   Please see the history of present illness.     All other systems reviewed and are negative.  EKGs/Labs/Other Studies Reviewed:    The following studies were reviewed today: Cardiac Studies & Procedures   CARDIAC CATHETERIZATION  CARDIAC CATHETERIZATION 09/15/2020  Narrative   Ost RCA to Prox RCA lesion is 50% stenosed.   Prox LAD lesion is 30% stenosed with 0% stenosed side branch in 1st Diag.   Prox Cx to Mid Cx lesion is 40% stenosed.  1.  Nonobstructive coronary artery disease with mild nonobstructive plaquing in the LAD, left circumflex, and moderate calcific stenosis of the RCA ostium. 2.  Low normal  LVEDP  Recommend: No high-grade coronary stenoses identified.  Patient with femur fracture.  He should not be at excessive risk of ischemic cardiac complications.  Findings Coronary Findings Diagnostic  Dominance: Right  Left Main The vessel exhibits minimal luminal irregularities. The left main is patent with  no obstructive disease.  The left main trifurcates into the LAD, left circumflex, and a small intermediate branch.  Left Anterior Descending Prox LAD lesion is 30% stenosed with 0% stenosed side branch in 1st Diag. Mild plaquing noted at the first septal perforator with hypodensity and approximately 30% stenosis  Left Circumflex Prox Cx to Mid Cx lesion is 40% stenosed.  Right Coronary Artery There is mild diffuse disease throughout the vessel. Ost RCA to Prox RCA lesion is 50% stenosed. The lesion is calcified. Heavy calcification of the right coronary cusp with moderate nonobstructive plaquing of 50%.  There is good contrast reflux and no pressure dampening with the catheter engaged in the artery.  Intervention  No interventions have been documented.     ECHOCARDIOGRAM  ECHOCARDIOGRAM COMPLETE 04/25/2022  Narrative ECHOCARDIOGRAM REPORT    Patient Name:   HAYDIN CALANDRA Date of Exam: 04/25/2022 Medical Rec #:  [id number]       Height:       69.0 in Accession #:    [id number]      Weight:       222.0 lb Date of Birth:  1940/08/29        BSA:          2.160 m Patient Age:    81 years        BP:           124/93 mmHg Patient Gender: M               HR:           56 bpm. Exam Location:  Inpatient  Procedure: 2D Echo, Color Doppler, Cardiac Doppler and Intracardiac Opacification Agent  Indications:     I50.31 Acute diastolic (congestive) heart failure  History:         Patient has prior history of Echocardiogram examinations, most recent 06/19/2021. CHF, COPD, Arrythmias:Atrial Fibrillation; Risk Factors:Hypertension and Dyslipidemia.  Sonographer:     Damien Senior  RDCS Referring Phys:  JUSTIN B HOWERTER Diagnosing Phys: Jerel Balding MD   Sonographer Comments: Technically difficult due to lung interference. IMPRESSIONS   1. Left ventricular ejection fraction, by estimation, is 60 to 65%. The left ventricle has normal function. The left ventricle has no regional wall motion abnormalities. There is mild concentric left ventricular hypertrophy. Left ventricular diastolic parameters are consistent with Grade II diastolic dysfunction (pseudonormalization). 2. Right ventricular systolic function is low normal. The right ventricular size is moderately enlarged. 3. Left atrial size was moderately dilated. 4. Right atrial size was moderately dilated. 5. The mitral valve is degenerative. Mild mitral valve regurgitation. 6. The aortic valve is calcified. There is mild calcification of the aortic valve. There is moderate thickening of the aortic valve. Aortic valve regurgitation is trivial. Mild to moderate aortic valve stenosis. Aortic valve mean gradient measures 17.0 mmHg. Aortic valve Vmax measures 2.82 m/s. 7. The inferior vena cava is normal in size with greater than 50% respiratory variability, suggesting right atrial pressure of 3 mmHg.  Comparison(s): Prior images reviewed side by side. The left ventricular function has improved. Aortic stenosis is now present.  FINDINGS Left Ventricle: Left ventricular ejection fraction, by estimation, is 60 to 65%. The left ventricle has normal function. The left ventricle has no regional wall motion abnormalities. Definity  contrast agent was given IV to delineate the left ventricular endocardial borders. The left ventricular internal cavity size was normal in size. There is mild concentric left ventricular hypertrophy. Left ventricular diastolic parameters are consistent with  Grade II diastolic dysfunction (pseudonormalization).  Right Ventricle: The right ventricular size is moderately enlarged. Right vetricular  wall thickness was not well visualized. Right ventricular systolic function is low normal.  Left Atrium: Left atrial size was moderately dilated.  Right Atrium: Right atrial size was moderately dilated.  Pericardium: There is no evidence of pericardial effusion. Presence of epicardial fat layer.  Mitral Valve: The mitral valve is degenerative in appearance. Mild mitral annular calcification. Mild mitral valve regurgitation.  Tricuspid Valve: The tricuspid valve is grossly normal. Tricuspid valve regurgitation is trivial.  Aortic Valve: The aortic valve is calcified. There is mild calcification of the aortic valve. There is moderate thickening of the aortic valve. Aortic valve regurgitation is trivial. Mild to moderate aortic stenosis is present. Aortic valve mean gradient measures 17.0 mmHg. Aortic valve peak gradient measures 31.8 mmHg. Aortic valve area, by VTI measures 1.53 cm.  Pulmonic Valve: The pulmonic valve was grossly normal. Pulmonic valve regurgitation is not visualized.  Aorta: The aortic root and ascending aorta are structurally normal, with no evidence of dilitation.  Venous: The inferior vena cava is normal in size with greater than 50% respiratory variability, suggesting right atrial pressure of 3 mmHg.  IAS/Shunts: No atrial level shunt detected by color flow Doppler.   LEFT VENTRICLE PLAX 2D LVIDd:         4.00 cm   Diastology LVIDs:         2.60 cm   LV e' medial:    6.53 cm/s LV PW:         1.30 cm   LV E/e' medial:  12.5 LV IVS:        1.10 cm   LV e' lateral:   7.83 cm/s LVOT diam:     2.30 cm   LV E/e' lateral: 10.4 LV SV:         107 LV SV Index:   50 LVOT Area:     4.15 cm   RIGHT VENTRICLE RV S prime:     11.10 cm/s TAPSE (M-mode): 2.3 cm  LEFT ATRIUM             Index        RIGHT ATRIUM           Index LA diam:        3.80 cm 1.76 cm/m   RA Area:     28.00 cm LA Vol (A2C):   74.9 ml 34.68 ml/m  RA Volume:   94.80 ml  43.89 ml/m LA Vol  (A4C):   88.9 ml 41.16 ml/m LA Biplane Vol: 92.3 ml 42.74 ml/m AORTIC VALVE AV Area (Vmax):    1.89 cm AV Area (Vmean):   1.63 cm AV Area (VTI):     1.53 cm AV Vmax:           282.00 cm/s AV Vmean:          195.000 cm/s AV VTI:            0.699 m AV Peak Grad:      31.8 mmHg AV Mean Grad:      17.0 mmHg LVOT Vmax:         128.00 cm/s LVOT Vmean:        76.600 cm/s LVOT VTI:          0.258 m LVOT/AV VTI ratio: 0.37  AORTA Ao Root diam: 3.30 cm Ao Asc diam:  3.30 cm  MITRAL VALVE MV Area (PHT): 3.21 cm  SHUNTS MV Decel Time: 236 msec    Systemic VTI:  0.26 m MV E velocity: 81.70 cm/s  Systemic Diam: 2.30 cm MV A velocity: 84.90 cm/s MV E/A ratio:  0.96  Mihai Croitoru MD Electronically signed by Jerel Balding MD Signature Date/Time: 04/25/2022/3:27:20 PM    Final (Updated)              EKG:  EKG is  ordered today.  The ekg ordered today demonstrates sinus bradycardia, HR 59 bpm, RBBB, LAD, consistent with prior EKG tracings.   Recent Labs: 01/28/2022: NT-Pro BNP 379 04/24/2022: B Natriuretic Peptide 409.6 04/26/2022: Magnesium  2.4 05/05/2022: Hemoglobin 11.8; Platelets 276; TSH 3.770 05/10/2022: ALT 42; BUN 19; Creatinine, Ser 1.21; Potassium 4.7; Sodium 130  Recent Lipid Panel    Component Value Date/Time   CHOL 188 10/14/2021 0827   TRIG 93 10/14/2021 0827   HDL 92 10/14/2021 0827   CHOLHDL 2.0 10/14/2021 0827   LDLCALC 80 10/14/2021 0827     Risk Assessment/Calculations:    CHA2DS2-VASc Score = 5   This indicates a 7.2% annual risk of stroke. The patient's score is based upon: CHF History: 1 HTN History: 1 Diabetes History: 0 Stroke History: 0 Vascular Disease History: 1 Age Score: 2 Gender Score: 0               Physical Exam:    VS:  BP 138/72   Pulse (!) 59   Ht 5' 9 (1.753 m)   Wt 217 lb 6.4 oz (98.6 kg)   SpO2 95%   BMI 32.10 kg/m     Wt Readings from Last 3 Encounters:  05/20/22 217 lb 6.4 oz (98.6 kg)  05/10/22 215 lb 6.4  oz (97.7 kg)  05/05/22 211 lb (95.7 kg)     GEN: ill appearing, no acute distress, ETOH noted on breath HEENT: Normal NECK: No JVD; No carotid bruits LYMPHATICS: No lymphadenopathy CARDIAC: RRR, no murmurs, rubs, gallops RESPIRATORY:  Trace rales noted, no wheezing ABDOMEN: Soft, non-tender, non-distended MUSCULOSKELETAL:  Kayceon Oki appearing legs; No deformity  SKIN: Warm and dry NEUROLOGIC:  Alert and oriented x 3 PSYCHIATRIC:  Normal affect   ASSESSMENT:    1. Chronic heart failure with preserved ejection fraction (HCC)   2. Paroxysmal atrial fibrillation (HCC)   3. Dizziness   4. Coronary artery disease involving native coronary artery of native heart without angina pectoris   5. Tobacco abuse   6. ETOH abuse   7. Hyponatremia    PLAN:    In order of problems listed above:  HFpEF - NYHA class II-III today, volume overloaded although his weight is down around 10 pounds since his discharge.  He is weighing himself daily.  He is not taking his Demadex  as prescribed, sometimes not taking it at all or only once per day.  I instructed him to go home and take his Demadex  two times today, and strict adherence to his diuretic moving forward.  We discussed extensively the amount of fluid he is drinking throughout the day, particularly beer and the deleterious effects that this has on his heart.  He advised he would cut out coffee before he was willing to cut out beer.  Continue Jardiance  10 mg daily, continue metoprolol  12.5 mg daily, continue Aldactone  25 mg daily, continue Demadex  20 mg twice daily. Losartan  was held at d/c d/t AKI. Will repeat BMET today, if creatinine has stabilized, will plan to start Entresto 24-26 mg twice daily. PAF-CHA2DS2-VASc score 5, he is in sinus  rhythm today for EKG, continue Eliquis  5 mg twice daily--no indication for dose reduction, continue Toprol  12.5 mg daily. Denies hematochezia, hematuria, hemoptysis.  Hyponatremia-most recent sodium was 130 on 05/10/2022,  will repeat BMET today.  He is a heavy drinker, and has been advised to cut back on his EtOH use.  May need to decrease his spironolactone  depending on what repeat BMET test. EtOH abuse-continues to drink  five 12 ounce beers per day, every day. ETOH noted on breath this morning. He is aware that he needs to cut back, however is not ready to make this change. Tobacco abuse - smokes 1-2 PPD, not interested in stopping. He does not smoke while wearing his oxygen  HS, he is adamant that he is careful. Total cessation encouraged.    Disposition - BMET today. Return in 2 weeks.        Medication Adjustments/Labs and Tests Ordered: Current medicines are reviewed at length with the patient today.  Concerns regarding medicines are outlined above.  Orders Placed This Encounter  Procedures   Basic metabolic panel   EKG 12-Lead   Meds ordered this encounter  Medications   apixaban  (ELIQUIS ) 5 MG TABS tablet    Sig: Take 1 tablet (5 mg total) by mouth 2 (two) times daily.    Dispense:  180 tablet    Refill:  3    Patient Instructions  Medication Instructions:  Your physician recommends that you continue on your current medications as directed. Please refer to the Current Medication list given to you today.  *If you need a refill on your cardiac medications before your next appointment, please call your pharmacy*   Lab Work: Your physician recommends that you have a BMP today in the office.  If you have labs (blood work) drawn today and your tests are completely normal, you will receive your results only by: MyChart Message (if you have MyChart) OR A paper copy in the mail If you have any lab test that is abnormal or we need to change your treatment, we will call you to review the results.   Testing/Procedures: None ordered   Follow-Up: At [facility], you and your health needs are our priority.  As part of our continuing mission to provide you with exceptional heart care,  we have created designated Provider Care Teams.  These Care Teams include your primary Cardiologist (physician) and Advanced Practice Providers (APPs -  Physician Assistants and Nurse Practitioners) who all work together to provide you with the care you need, when you need it.  We recommend signing up for the patient portal called MyChart.  Sign up information is provided on this After Visit Summary.  MyChart is used to connect with patients for Virtual Visits (Telemedicine).  Patients are able to view lab/test results, encounter notes, upcoming appointments, etc.  Non-urgent messages can be sent to your provider as well.   To learn more about what you can do with MyChart, go to [email or web address].    Your next appointment:   2 week(s)  The format for your next appointment:   In Person  Provider:   Delon Hoover, NP [facility])    Other Instructions none  Important Information About Sugar        Signed, Delon JAYSON Hoover, NP  05/20/2022 11:42 AM    [facility] "

## 2022-05-20 ENCOUNTER — Ambulatory Visit: Payer: Medicare HMO | Attending: Cardiology | Admitting: Cardiology

## 2022-05-20 ENCOUNTER — Encounter: Payer: Self-pay | Admitting: Cardiology

## 2022-05-20 VITALS — BP 138/72 | HR 59 | Ht 69.0 in | Wt 217.4 lb

## 2022-05-20 DIAGNOSIS — E871 Hypo-osmolality and hyponatremia: Secondary | ICD-10-CM | POA: Diagnosis not present

## 2022-05-20 DIAGNOSIS — I480 Paroxysmal atrial fibrillation: Secondary | ICD-10-CM | POA: Diagnosis not present

## 2022-05-20 DIAGNOSIS — R42 Dizziness and giddiness: Secondary | ICD-10-CM

## 2022-05-20 DIAGNOSIS — I2510 Atherosclerotic heart disease of native coronary artery without angina pectoris: Secondary | ICD-10-CM | POA: Diagnosis not present

## 2022-05-20 DIAGNOSIS — Z720 Tobacco use: Secondary | ICD-10-CM | POA: Diagnosis not present

## 2022-05-20 DIAGNOSIS — I5032 Chronic diastolic (congestive) heart failure: Secondary | ICD-10-CM

## 2022-05-20 DIAGNOSIS — F1010 Alcohol abuse, uncomplicated: Secondary | ICD-10-CM

## 2022-05-20 MED ORDER — APIXABAN 5 MG PO TABS: 5.0000 mg | ORAL_TABLET | Freq: Two times a day (BID) | ORAL | 3 refills | Status: DC

## 2022-05-20 NOTE — Patient Instructions (Signed)
Medication Instructions:  Your physician recommends that you continue on your current medications as directed. Please refer to the Current Medication list given to you today.  *If you need a refill on your cardiac medications before your next appointment, please call your pharmacy*   Lab Work: Your physician recommends that you have a BMP today in the office.  If you have labs (blood work) drawn today and your tests are completely normal, you will receive your results only by: MyChart Message (if you have MyChart) OR A paper copy in the mail If you have any lab test that is abnormal or we need to change your treatment, we will call you to review the results.   Testing/Procedures: None ordered   Follow-Up: At [facility], you and your health needs are our priority.  As part of our continuing mission to provide you with exceptional heart care, we have created designated Provider Care Teams.  These Care Teams include your primary Cardiologist (physician) and Advanced Practice Providers (APPs -  Physician Assistants and Nurse Practitioners) who all work together to provide you with the care you need, when you need it.  We recommend signing up for the patient portal called "MyChart".  Sign up information is provided on this After Visit Summary.  MyChart is used to connect with patients for Virtual Visits (Telemedicine).  Patients are able to view lab/test results, encounter notes, upcoming appointments, etc.  Non-urgent messages can be sent to your provider as well.   To learn more about what you can do with MyChart, go to [email or web address].    Your next appointment:   2 week(s)  The format for your next appointment:   In Person  Provider:   Wallis Bamberg, NP [facility])    Other Instructions none  Important Information About Sugar

## 2022-05-21 LAB — BASIC METABOLIC PANEL WITH GFR
BUN/Creatinine Ratio: 13 (ref 10–24)
BUN: 14 mg/dL (ref 8–27)
CO2: 25 mmol/L (ref 20–29)
Calcium: 9.1 mg/dL (ref 8.6–10.2)
Chloride: 91 mmol/L — ABNORMAL LOW (ref 96–106)
Creatinine, Ser: 1.1 mg/dL (ref 0.76–1.27)
Glucose: 88 mg/dL (ref 70–99)
Potassium: 4.6 mmol/L (ref 3.5–5.2)
Sodium: 130 mmol/L — ABNORMAL LOW (ref 134–144)
eGFR: 68 mL/min/1.73

## 2022-05-25 ENCOUNTER — Ambulatory Visit (HOSPITAL_COMMUNITY): Payer: Medicare HMO

## 2022-05-25 NOTE — Telephone Encounter (Signed)
Sandi Raveling,  Do you mind trying to reach pt again?  If you can't or you dont get him please let me know.  Thank you Penne Lash, RMA Care Guide [facility]  [place], [place] [address] Direct Dial: [phone] [email or web address]

## 2022-05-26 ENCOUNTER — Other Ambulatory Visit: Payer: Medicare HMO | Admitting: Pharmacist

## 2022-05-26 NOTE — Progress Notes (Signed)
05/26/2022 Name: Kyle Farley MRN: [id number] DOB: 10-Sep-1940  Chief Complaint  Patient presents with   Medication Management    Kyle Farley is a 82 y.o. year old male who presented for a telephone visit.   They were referred to the pharmacist by their Case Management Team  for assistance in managing complex medication management.   Patient reports he is feeling better today after about 1-2 weeks of an acute illness.  Patient reports he is down to 4-5 beers/day (was 8-10/day). Patient reports smoking 1.5-2ppd in 24hr. Not interested in quitting at this time.  Subjective:  Care Team: Primary Care Provider: Blane Ohara, MD  Medication Access/Adherence  Current Pharmacy:  [facility] [address] [place] [address] Phone: [phone] Fax: [phone]  [facility] [address] [place] [address] Phone: [phone] Fax: [phone]  [facility] [address] [place] - [address] [place] [address] Phone: [phone] Fax: [phone]  [facility] [address], [place] - [address] [place] [address] Phone: [phone] Fax: [phone]   Patient reports affordability concerns with their medications: No  Patient reports access/transportation concerns to their pharmacy: No  Patient reports adherence concerns with their medications:  No      Medication Management:  Current adherence strategy: Patient and son states he has a separate bag of pill bottles that are his once per day, and then the twice per day bottles are out on the counter. Patient does not like pill boxes, he finds they are more confusing. He describes this system is working well for him.  Patient reports Good adherence to medications  Patient reports the following barriers to adherence: none at present    Objective:  No results found for:  "HGBA1C"  Lab Results  Component Value Date   CREATININE 1.10 05/20/2022   BUN 14 05/20/2022   NA 130 (L) 05/20/2022   K 4.6 05/20/2022   CL 91 (L) 05/20/2022   CO2 25 05/20/2022    Lab Results  Component Value Date   CHOL 188 10/14/2021   HDL 92 10/14/2021   LDLCALC 80 10/14/2021   TRIG 93 10/14/2021   CHOLHDL 2.0 10/14/2021    Medications Reviewed Today     Reviewed by Gabriel Carina, RPH (Pharmacist) on 05/26/22 at 1024  Med List Status: <None>   Medication Order Taking? Sig Documenting Provider Last Dose Status Informant  albuterol (VENTOLIN HFA) 108 (90 Base) MCG/ACT inhaler [id number] Yes Inhale 2 puffs into the lungs every 4 (four) hours as needed for wheezing or shortness of breath. [name], Kirsten, MD Taking Active   apixaban (ELIQUIS) 5 MG TABS tablet [id number] Yes Take 1 tablet (5 mg total) by mouth 2 (two) times daily. Flossie Dibble, NP Taking Active   atorvastatin (LIPITOR) 40 MG tablet [id number] Yes Take 1 tablet (40 mg total) by mouth daily. Gaston Islam., NP Taking Active Self, Pharmacy Records  diphenhydrAMINE HCl, Sleep, (ZZZQUIL PO) [id number] Yes Take 1 tablet by mouth at bedtime. [provider] Taking Active Self, Pharmacy Records  empagliflozin (JARDIANCE) 10 MG TABS tablet [id number] Yes Take 1 tablet (10 mg total) by mouth daily. Zannie Cove, MD Taking Active   Fluticasone-Umeclidin-Vilant [facility] ELLIPTA) 200-62.5-25 MCG/ACT AEPB [id number] Yes Inhale 2 puffs into the lungs daily. Lurline Del, FNP Taking Active Self, Pharmacy Records  ipratropium-albuterol (DUONEB) 0.5-2.5 (3) MG/3ML  SOLN [id number] Yes Inhale 3 mLs into the lungs every 6 (six) hours as needed. CoxFritzi Mandes, MD Taking Active Self, Pharmacy Records  levothyroxine (SYNTHROID) 50 MCG tablet [id number] Yes Take 50 mcg by mouth daily before breakfast. [provider] Taking Active   metoprolol succinate (TOPROL-XL) 25 MG 24 hr tablet [id number] Yes Take 12.5 mg by mouth  daily. [provider] Taking Active   montelukast (SINGULAIR) 10 MG tablet [id number] Yes Take 1 tablet (10 mg total) by mouth daily at 12 noon. Blane Ohara, MD Taking Active Self, Pharmacy Records  OXYGEN [id number] Yes Inhale 3 L into the lungs as needed (shortness of breath). [provider] Taking Active   pantoprazole (PROTONIX) 40 MG tablet [id number] Yes Take 1 tablet (40 mg total) by mouth daily. Zannie Cove, MD Taking Active   spironolactone (ALDACTONE) 25 MG tablet [id number] Yes Take 1 tablet (25 mg total) by mouth daily. Zannie Cove, MD Taking Active   tamsulosin [facility]) 0.4 MG CAPS capsule [id number] Yes Take 1 capsule (0.4 mg total) by mouth daily. Zannie Cove, MD Taking Active   torsemide [facility]) 20 MG tablet [id number] Yes Take 20 mg by mouth 2 (two) times daily. [provider] Taking Active               Assessment/Plan:   Medication Management: - Currently strategy sufficient to maintain appropriate adherence to prescribed medication regimen - Patient reports all medications have been picked up from his pharmacy and are on track smoothly at this time. Inquired about fill history for eliquis, he states he is adherent to this medication, may have not been showing as filled on time due to having more medication on hand at home from when he was in hospital and not taking it from his supply. - No concerns at this time, continue current regimen   Follow Up Plan: PRN, 1 month check-ins for med adherence/management  Lynnda Shields, PharmD, BCPS Clinical Pharmacist [facility]

## 2022-05-27 ENCOUNTER — Ambulatory Visit (INDEPENDENT_AMBULATORY_CARE_PROVIDER_SITE_OTHER): Payer: Medicare HMO | Admitting: Family Medicine

## 2022-05-27 VITALS — BP 128/58 | HR 78 | Temp 97.1°F | Resp 18 | Ht 69.0 in | Wt 208.0 lb

## 2022-05-27 DIAGNOSIS — J301 Allergic rhinitis due to pollen: Secondary | ICD-10-CM

## 2022-05-27 DIAGNOSIS — R7309 Other abnormal glucose: Secondary | ICD-10-CM | POA: Diagnosis not present

## 2022-05-27 DIAGNOSIS — I10 Essential (primary) hypertension: Secondary | ICD-10-CM

## 2022-05-27 DIAGNOSIS — J449 Chronic obstructive pulmonary disease, unspecified: Secondary | ICD-10-CM

## 2022-05-27 DIAGNOSIS — Z7951 Long term (current) use of inhaled steroids: Secondary | ICD-10-CM | POA: Diagnosis not present

## 2022-05-27 HISTORY — DX: Other abnormal glucose: R73.09

## 2022-05-27 MED ORDER — TRIAMCINOLONE ACETONIDE 40 MG/ML IJ SUSP
80.0000 mg | Freq: Once | INTRAMUSCULAR | Status: AC
Start: 2022-05-27 — End: 2022-05-27
  Administered 2022-05-27: 80 mg via INTRAMUSCULAR

## 2022-05-27 MED ORDER — MONTELUKAST SODIUM 10 MG PO TABS: 10.0000 mg | ORAL_TABLET | Freq: Every day | ORAL | 3 refills | Status: DC

## 2022-05-27 NOTE — Progress Notes (Signed)
Subjective:  Patient ID: Kyle Farley, male    DOB: April 01, 1940  Age: 82 y.o. MRN: [id number]  Chief Complaint  Patient presents with   Cough    HPI Kyle Farley was scheduled to come in for follow-up for his powered scooter.  His insurance declined approval but he was able to purchase one out of pocket.  He comes for follow-up of dry cough and congestion which started last Tuesday. He said that he had chills initially but that has cleared.      05/27/2022    4:12 PM 04/02/2022   11:24 AM 04/02/2022   10:41 AM 02/16/2022    2:56 PM 11/10/2021    2:16 PM  Depression screen PHQ 2/9  Decreased Interest 0 0 0 0 0  Down, Depressed, Hopeless 0 0 0 0 0  PHQ - 2 Score 0 0 0 0 0  Altered sleeping 0    0  Tired, decreased energy 2    0  Change in appetite 2    0  Feeling bad or failure about yourself  0    0  Trouble concentrating 0    0  Moving slowly or fidgety/restless 3    0  Suicidal thoughts 0    0  PHQ-9 Score 7    0  Difficult doing work/chores Somewhat difficult    Not difficult at all        05/27/2022    4:12 PM  Fall Risk   Falls in the past year? 0  Number falls in past yr: 0  Injury with Fall? 0  Risk for fall due to : History of fall(s)  Follow up Falls evaluation completed;Falls prevention discussed    Patient Care Team: Blane Ohara, MD as PCP - General (Family Medicine) Regan Lemming, MD as Consulting Physician (Cardiology) Marlowe Sax, RN as Case Manager (General Practice) Baldo Daub, MD as Consulting Physician (Cardiology)   Review of Systems  Constitutional:  Positive for fever. Negative for chills.  HENT:  Positive for congestion. Negative for rhinorrhea and sore throat.   Respiratory:  Positive for cough. Negative for shortness of breath.   Cardiovascular:  Negative for chest pain and palpitations.  Gastrointestinal:  Negative for abdominal pain, constipation, diarrhea, nausea and vomiting.  Genitourinary:  Negative for dysuria and urgency.   Musculoskeletal:  Negative for arthralgias, back pain and myalgias.  Neurological:  Negative for dizziness and headaches.  Psychiatric/Behavioral:  Negative for dysphoric mood. The patient is not nervous/anxious.     Current Outpatient Medications on File Prior to Visit  Medication Sig Dispense Refill   albuterol (VENTOLIN HFA) 108 (90 Base) MCG/ACT inhaler Inhale 2 puffs into the lungs every 4 (four) hours as needed for wheezing or shortness of breath. 48 each 2   apixaban (ELIQUIS) 5 MG TABS tablet Take 1 tablet (5 mg total) by mouth 2 (two) times daily. 180 tablet 3   atorvastatin (LIPITOR) 40 MG tablet Take 1 tablet (40 mg total) by mouth daily. 90 tablet 3   diphenhydrAMINE HCl, Sleep, (ZZZQUIL PO) Take 1 tablet by mouth at bedtime.     empagliflozin (JARDIANCE) 10 MG TABS tablet Take 1 tablet (10 mg total) by mouth daily. 30 tablet 1   Fluticasone-Umeclidin-Vilant (TRELEGY ELLIPTA) 200-62.5-25 MCG/ACT AEPB Inhale 2 puffs into the lungs daily. 60 each 1   ipratropium-albuterol (DUONEB) 0.5-2.5 (3) MG/3ML SOLN Inhale 3 mLs into the lungs every 6 (six) hours as needed. 360 mL 1  levothyroxine (SYNTHROID) 50 MCG tablet Take 50 mcg by mouth daily before breakfast.     metoprolol succinate (TOPROL-XL) 25 MG 24 hr tablet Take 12.5 mg by mouth daily.     OXYGEN Inhale 3 L into the lungs as needed (shortness of breath).     pantoprazole (PROTONIX) 40 MG tablet Take 1 tablet (40 mg total) by mouth daily. 30 tablet 0   spironolactone (ALDACTONE) 25 MG tablet Take 1 tablet (25 mg total) by mouth daily. 30 tablet 0   tamsulosin (FLOMAX) 0.4 MG CAPS capsule Take 1 capsule (0.4 mg total) by mouth daily. 30 capsule 0   torsemide (DEMADEX) 20 MG tablet Take 20 mg by mouth 2 (two) times daily.     No current facility-administered medications on file prior to visit.   Past Medical History:  Diagnosis Date   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 (HCC) 11/10/2021   Acquired hypothyroidism  11/10/2021   Acute hypoxic respiratory failure (HCC) 04/25/2022   Acute on chronic diastolic heart failure (HCC) 04/25/2022   Acute on chronic systolic heart failure (HCC) 11/10/2021   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/10/2021   Alcohol use 09/12/2020   Anemia of chronic disease 04/25/2022   Arthritis    Atherosclerosis of native arteries of extremities with intermittent claudication, bilateral legs (HCC) 11/10/2021   Bilateral leg edema 02/16/2022   BPH (benign prostatic hyperplasia) 11/10/2021   CHF (congestive heart failure) (HCC)    Chronic hyponatremia 04/25/2022   Chronic respiratory failure with hypoxia (HCC) 05/09/2022   Cigarette smoker 10/21/2014   Class 1 obesity 04/25/2022   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)    Dyspnea    Dysrhythmia    atrial fibrillation   Encounter for prostate cancer screening 02/16/2022   Encounter for screening for lung cancer 02/16/2022   Essential hypertension 10/21/2014   Femur fracture, right (HCC) 09/12/2020   GERD (gastroesophageal reflux disease)    History of kidney stones    Hyperlipidemia 01/24/2017   Hypertension    Hyponatremia 04/26/2022   Kidney stones 01/24/2017   Paroxysmal atrial fibrillation (HCC) 10/21/2014   Pressure injury of skin 09/16/2020   SOB (shortness of breath) 05/10/2022   Tobacco abuse 02/16/2022   Past Surgical History:  Procedure Laterality Date   LEFT HEART CATH AND CORONARY ANGIOGRAPHY N/A 09/15/2020   Procedure: LEFT HEART CATH AND CORONARY ANGIOGRAPHY;  Surgeon: Tonny Bollman, MD;  Location: [facility] INVASIVE CV LAB;  Service: Cardiovascular;  Laterality: N/A;   LEG SURGERY Right    steal pin placed in right leg 35 years ago   ORIF FEMUR FRACTURE Right 09/15/2020   Procedure: OPEN REDUCTION INTERNAL FIXATION FEMORAL SHAFT FRACTURE;  Surgeon: Roby Lofts, MD;  Location: [facility] OR;  Service: Orthopedics;  Laterality: Right;    Family History  Problem Relation Age of Onset    Cancer Mother    Diabetes Mother    Breast cancer Sister    Social History   Socioeconomic History   Marital status: Widowed    Spouse name: Not on file   Number of children: 3   Years of education: Not on file   Highest education level: High school graduate  Occupational History   Occupation: retired  Tobacco Use   Smoking status: Every Day    Packs/day: 1.00    Years: 62.00    Additional pack years: 0.00    Total pack years: 62.00    Types: Cigarettes, Cigars   Smokeless tobacco: Never  Vaping Use   Vaping Use: Never used  Substance and Sexual Activity   Alcohol use: Yes    Alcohol/week: 5.0 standard drinks of alcohol    Types: 5 Standard drinks or equivalent per week    Comment: 4-5 beers per day   Drug use: No   Sexual activity: Not Currently  Other Topics Concern   Not on file  Social History Narrative   Not on file   Social Determinants of Health   Financial Resource Strain: Low Risk  (04/26/2022)   Overall Financial Resource Strain (CARDIA)    Difficulty of Paying Living Expenses: Not very hard  Food Insecurity: No Food Insecurity (04/15/2022)   Hunger Vital Sign    Worried About Running Out of Food in the Last Year: Never true    Ran Out of Food in the Last Year: Never true  Transportation Needs: No Transportation Needs (04/26/2022)   PRAPARE - Administrator, Civil Service (Medical): No    Lack of Transportation (Non-Medical): No  Physical Activity: Inactive (04/15/2022)   Exercise Vital Sign    Days of Exercise per Week: 0 days    Minutes of Exercise per Session: 0 min  Stress: No Stress Concern Present (04/15/2022)   [employer] of Occupational Health - Occupational Stress Questionnaire    Feeling of Stress : Not at all  Social Connections: Moderately Isolated (04/15/2022)   Social Connection and Isolation Panel [NHANES]    Frequency of Communication with Friends and Family: More than three times a week    Frequency of Social  Gatherings with Friends and Family: More than three times a week    Attends Religious Services: More than 4 times per year    Active Member of [employer] or Organizations: No    Attends Banker Meetings: Never    Marital Status: Widowed    Objective:  BP (!) 128/58   Pulse 78   Temp (!) 97.1 F (36.2 C)   Resp 18   Ht 5\' 9"  (1.753 m)   Wt 208 lb (94.3 kg)   BMI 30.72 kg/m      06/03/2022    8:59 AM 06/03/2022    8:34 AM 05/27/2022    3:44 PM  BP/Weight  Systolic BP 168 180 128  Diastolic BP 100 84 58  Wt. (Lbs)  205.4 208  BMI  30.33 kg/m2 30.72 kg/m2    Physical Exam Constitutional:      Appearance: Normal appearance.  HENT:     Right Ear: Tympanic membrane, ear canal and external ear normal.     Left Ear: Tympanic membrane, ear canal and external ear normal.     Nose: Congestion (pale edema.) and rhinorrhea present.     Mouth/Throat:     Mouth: Mucous membranes are moist.     Pharynx: No oropharyngeal exudate or posterior oropharyngeal erythema.  Cardiovascular:     Rate and Rhythm: Normal rate and regular rhythm.     Heart sounds: Normal heart sounds.  Pulmonary:     Effort: Pulmonary effort is normal. No respiratory distress.     Breath sounds: Wheezing (milc) present. No rhonchi or rales.  Lymphadenopathy:     Cervical: No cervical adenopathy.  Neurological:     Mental Status: He is alert.  Psychiatric:        Mood and Affect: Mood normal.        Behavior: Behavior normal.     Diabetic Foot Exam - Simple   No  data filed      Lab Results  Component Value Date   WBC 9.9 05/27/2022   HGB 11.2 (L) 05/27/2022   HCT 34.5 (L) 05/27/2022   PLT 284 05/27/2022   GLUCOSE 112 (H) 05/27/2022   CHOL 188 10/14/2021   TRIG 93 10/14/2021   HDL 92 10/14/2021   LDLCALC 80 10/14/2021   ALT 44 05/27/2022   AST 30 05/27/2022   NA 137 05/27/2022   K 4.9 05/27/2022   CL 92 (L) 05/27/2022   CREATININE 1.32 (H) 05/27/2022   BUN 15 05/27/2022   CO2 29  05/27/2022   TSH 3.770 05/05/2022   HGBA1C 6.5 (H) 05/27/2022      Assessment & Plan:    Elevated glucose Assessment & Plan: Recommend continue to work on eating healthy diet and exercise.   Orders: -     Hemoglobin A1c  Primary hypertension Assessment & Plan: Continue toprol xl, spironalctone, and torsemide.   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  Kenalog 80 mg IM.    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  Continue albuterol hfa 2 puffs four times a day as needed.  Kenalog 80 mg I'm.    Other orders -     Montelukast Sodium; Take 1 tablet (10 mg total) by mouth daily at 12 noon.  Dispense: 90 tablet; Refill: 3     Meds ordered this encounter  Medications   montelukast (SINGULAIR) 10 MG tablet    Sig: Take 1 tablet (10 mg total) by mouth daily at 12 noon.    Dispense:  90 tablet    Refill:  3   triamcinolone acetonide (KENALOG-40) injection 80 mg    Orders Placed This Encounter  Procedures   CBC with Differential/Platelet   Comprehensive metabolic panel   Hemoglobin A1c     Follow-up: No follow-ups on file.   I,Carolyn M [place],acting as a Neurosurgeon for Blane Ohara, MD.,have documented all relevant documentation on the behalf of Blane Ohara, MD,as directed by  Blane Ohara, MD while in the presence of Blane Ohara, MD.   An After Visit Summary was printed and given to the patient.  I attest that I have reviewed this visit and agree with the plan scribed by my staff.   Blane Ohara, MD [facility] [phone]

## 2022-05-27 NOTE — Patient Instructions (Signed)
Restart singulair 10 mg daily May add generic claritin, zyrtec, or allegra if needed.  Given Kenalog shot in the office.

## 2022-05-28 LAB — COMPREHENSIVE METABOLIC PANEL
ALT: 44 IU/L (ref 0–44)
AST: 30 IU/L (ref 0–40)
Albumin/Globulin Ratio: 1.4 (ref 1.2–2.2)
Albumin: 4 g/dL (ref 3.8–4.8)
Alkaline Phosphatase: 97 IU/L (ref 44–121)
BUN/Creatinine Ratio: 11 (ref 10–24)
BUN: 15 mg/dL (ref 8–27)
Bilirubin Total: 0.3 mg/dL (ref 0.0–1.2)
CO2: 29 mmol/L (ref 20–29)
Calcium: 9.3 mg/dL (ref 8.6–10.2)
Chloride: 92 mmol/L — ABNORMAL LOW (ref 96–106)
Creatinine, Ser: 1.32 mg/dL — ABNORMAL HIGH (ref 0.76–1.27)
Globulin, Total: 2.8 g/dL (ref 1.5–4.5)
Glucose: 112 mg/dL — ABNORMAL HIGH (ref 70–99)
Potassium: 4.9 mmol/L (ref 3.5–5.2)
Sodium: 137 mmol/L (ref 134–144)
Total Protein: 6.8 g/dL (ref 6.0–8.5)
eGFR: 55 mL/min/{1.73_m2} — ABNORMAL LOW (ref >59)

## 2022-05-28 LAB — CBC WITH DIFFERENTIAL/PLATELET
Basophils Absolute: 0.1 10*3/uL (ref 0.0–0.2)
Basos: 1 %
EOS (ABSOLUTE): 0.1 10*3/uL (ref 0.0–0.4)
Eos: 1 %
Hematocrit: 34.5 % — ABNORMAL LOW (ref 37.5–51.0)
Hemoglobin: 11.2 g/dL — ABNORMAL LOW (ref 13.0–17.7)
Immature Grans (Abs): 0.3 10*3/uL — ABNORMAL HIGH (ref 0.0–0.1)
Immature Granulocytes: 3 %
Lymphocytes Absolute: 1.5 10*3/uL (ref 0.7–3.1)
Lymphs: 15 %
MCH: 28 pg (ref 26.6–33.0)
MCHC: 32.5 g/dL (ref 31.5–35.7)
MCV: 86 fL (ref 79–97)
Monocytes Absolute: 1 10*3/uL — ABNORMAL HIGH (ref 0.1–0.9)
Monocytes: 10 %
Neutrophils Absolute: 7 10*3/uL (ref 1.4–7.0)
Neutrophils: 70 %
Platelets: 284 10*3/uL (ref 150–450)
RBC: 4 x10E6/uL — ABNORMAL LOW (ref 4.14–5.80)
RDW: 14.3 % (ref 11.6–15.4)
WBC: 9.9 10*3/uL (ref 3.4–10.8)

## 2022-05-28 LAB — HEMOGLOBIN A1C
Est. average glucose Bld gHb Est-mCnc: 140 mg/dL
Hgb A1c MFr Bld: 6.5 % — ABNORMAL HIGH (ref 4.8–5.6)

## 2022-06-02 NOTE — Progress Notes (Signed)
 " Cardiology Office Note:    Date:  06/03/2022   ID:  Kyle Farley, DOB 1940/05/07, MRN [id number]  PCP:  Sherre Clapper, MD   [facility] Providers Cardiologist:  None     Referring MD: Sherre Clapper, MD   CC: hospital follow up   History of Present Illness:    Kyle Farley is a 82 y.o. male with a hx of paroxysmal atrial fibrillation, non-obstructive CAD, hypertension, CHF, AAA, PAD, COPD O2 HS, GERD, acquired hypothyroidism, history of alcohol abuse, BPH, hyperlipidemia.  He is an established patient with Dr. Revankar at least since 2016 for the management of PAF, hypertension.  Echo in August 2022 revealed an EF of 40 to 45%, global hypokinesis, diastolic parameters were indeterminate, left atrium moderately dilated, mild MR.  LHC on 09/15/2020 revealed nonobstructive CAD with mild nonobstructive plaquing in the LAD, left circumflex, moderate calcific stenosis of the RCA ostium.  Repeat echo in May 2023 revealed an EF of 50 to 55%, severely dilated left atrium, mildly enlarged right atrium, trace to mild AR, mild aortic stenosis, mild MR.  He was admitted on 04/24/2022 to 04/30/2022 for acute on chronic diastolic heart failure, after complaints of progressive shortness of breath, orthopnea and chest pain.  He was bradycardic in the emergency department with his heart rate in the 30s, pedal edema, chest x-ray revealed mild pulmonary vascular congestion.  He was diuresed 12.5 L, creatinine trended up to 1.5, his losartan  was held.  Echo at this time revealed EF 60 to 65%, mild concentric LVH, grade 2 DD, moderate dilatation of right and left atrium, mild MR, mild calcification of the aortic valve, moderate thickening of the aortic valve, mild to moderate aortic valve stenosis.  Since his discharge he has been evaluated by his PCP on 05/05/2022, he had been requiring oxygen  since discharge.  He was evaluated by heart failure TOC on 05/10/2022, said he had not felt well for the previous  2 days, increased fatigue and weakness.  His amiodarone  was stopped at this visit due to junctional rhythm, heart rate 45 bpm.  Low-dose metoprolol  was continued.  Head CT was arranged due to his sudden onset of dizziness however it was negative for any intracranial findings.  Blood work at this time revealed a sodium of 130, albumin  3.2.  He presented 2 weeks ago for hospital follow-up.  He had been weighing himself daily, weights were consistently between 208 to 210, weight in the office was 217. He was sporadically taking his Demadex  due to inconvenience of increased urination.  He was continuing to drink at least five 12 ounce beers per day, as well as smoking 1 to 2 packs/day--was not inclined to decrease his amount of tobacco or alcohol use.  Repeat CMET on 05/27/2022 revealed sodium 137, potassium 4.9, creatinine 1.32, GFR 55, normal LFTs.  He presents today accompanied by his son for follow-up of his heart failure.  Clinically, he looks much better than he did at our previous visit.  His weight in the office today is 205 which is down from 217 at his last visit.  He is weighing himself daily.  He has cut back from 5 beers a day, to 2-3 beers per day.  He has added 1 bottle of water to his fluid intake.  He continues to smoke and has not made any progress with decreasing his tobacco intake, nor is he interested.  He states he feels very well overall,  a lot better than I did  a few weeks ago.  We discussed the importance of continuing to weigh daily, and the indications of what sudden changes in his weight mean, as well as what to do when he notices a sudden change in his weight.  He has been trying to walk around more at his house. He denies chest pain, palpitations, pnd, orthopnea, n, v, dizziness, syncope, weight gain, or early satiety.   Past Medical History:  Diagnosis Date   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 (HCC) 11/10/2021   Acquired hypothyroidism 11/10/2021   Acute hypoxic  respiratory failure (HCC) 04/25/2022   Acute on chronic diastolic heart failure (HCC) 04/25/2022   Acute on chronic systolic heart failure (HCC) 11/10/2021   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/10/2021   Alcohol use 09/12/2020   Anemia of chronic disease 04/25/2022   Arthritis    Atherosclerosis of native arteries of extremities with intermittent claudication, bilateral legs (HCC) 11/10/2021   Bilateral leg edema 02/16/2022   BPH (benign prostatic hyperplasia) 11/10/2021   CHF (congestive heart failure) (HCC)    Chronic hyponatremia 04/25/2022   Chronic respiratory failure with hypoxia (HCC) 05/09/2022   Cigarette smoker 10/21/2014   Class 1 obesity 04/25/2022   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)    Dyspnea    Dysrhythmia    atrial fibrillation   Encounter for prostate cancer screening 02/16/2022   Encounter for screening for lung cancer 02/16/2022   Essential hypertension 10/21/2014   Femur fracture, right (HCC) 09/12/2020   GERD (gastroesophageal reflux disease)    History of kidney stones    Hyperlipidemia 01/24/2017   Hypertension    Hyponatremia 04/26/2022   Kidney stones 01/24/2017   Paroxysmal atrial fibrillation (HCC) 10/21/2014   Pressure injury of skin 09/16/2020   SOB (shortness of breath) 05/10/2022   Tobacco abuse 02/16/2022    Past Surgical History:  Procedure Laterality Date   LEFT HEART CATH AND CORONARY ANGIOGRAPHY N/A 09/15/2020   Procedure: LEFT HEART CATH AND CORONARY ANGIOGRAPHY;  Surgeon: Wonda Sharper, MD;  Location: [facility] INVASIVE CV LAB;  Service: Cardiovascular;  Laterality: N/A;   LEG SURGERY Right    steal pin placed in right leg 35 years ago   ORIF FEMUR FRACTURE Right 09/15/2020   Procedure: OPEN REDUCTION INTERNAL FIXATION FEMORAL SHAFT FRACTURE;  Surgeon: Kendal Franky SQUIBB, MD;  Location: [facility] OR;  Service: Orthopedics;  Laterality: Right;    Current Medications: Current Meds  Medication Sig   albuterol   (VENTOLIN  HFA) 108 (90 Base) MCG/ACT inhaler Inhale 2 puffs into the lungs every 4 (four) hours as needed for wheezing or shortness of breath.   apixaban  (ELIQUIS ) 5 MG TABS tablet Take 1 tablet (5 mg total) by mouth 2 (two) times daily.   atorvastatin  (LIPITOR) 40 MG tablet Take 1 tablet (40 mg total) by mouth daily.   diphenhydrAMINE HCl, Sleep, (ZZZQUIL PO) Take 1 tablet by mouth at bedtime.   empagliflozin  (JARDIANCE ) 10 MG TABS tablet Take 1 tablet (10 mg total) by mouth daily.   Fluticasone -Umeclidin-Vilant (TRELEGY ELLIPTA ) 200-62.5-25 MCG/ACT AEPB Inhale 2 puffs into the lungs daily.   ipratropium-albuterol  (DUONEB) 0.5-2.5 (3) MG/3ML SOLN Inhale 3 mLs into the lungs every 6 (six) hours as needed.   levothyroxine  (SYNTHROID ) 50 MCG tablet Take 50 mcg by mouth daily before breakfast.   metoprolol  succinate (TOPROL -XL) 25 MG 24 hr tablet Take 12.5 mg by mouth daily.   montelukast  (SINGULAIR ) 10 MG tablet Take 1 tablet (10 mg total) by mouth daily at 12  noon.   OXYGEN  Inhale 3 L into the lungs as needed (shortness of breath).   pantoprazole  (PROTONIX ) 40 MG tablet Take 1 tablet (40 mg total) by mouth daily.   spironolactone  (ALDACTONE ) 25 MG tablet Take 1 tablet (25 mg total) by mouth daily.   tamsulosin  (FLOMAX ) 0.4 MG CAPS capsule Take 1 capsule (0.4 mg total) by mouth daily.   torsemide  (DEMADEX ) 20 MG tablet Take 20 mg by mouth 2 (two) times daily.     Allergies:   Patient has no active allergies.   Social History   Socioeconomic History   Marital status: Widowed    Spouse name: Not on file   Number of children: 3   Years of education: Not on file   Highest education level: High school graduate  Occupational History   Occupation: retired  Tobacco Use   Smoking status: Every Day    Packs/day: 1.00    Years: 62.00    Additional pack years: 0.00    Total pack years: 62.00    Types: Cigarettes, Cigars   Smokeless tobacco: Never  Vaping Use   Vaping Use: Never used   Substance and Sexual Activity   Alcohol use: Yes    Alcohol/week: 5.0 standard drinks of alcohol    Types: 5 Standard drinks or equivalent per week    Comment: 4-5 beers per day   Drug use: No   Sexual activity: Not Currently  Other Topics Concern   Not on file  Social History Narrative   Not on file   Social Determinants of Health   Financial Resource Strain: Low Risk  (04/26/2022)   Overall Financial Resource Strain (CARDIA)    Difficulty of Paying Living Expenses: Not very hard  Food Insecurity: No Food Insecurity (04/15/2022)   Hunger Vital Sign    Worried About Running Out of Food in the Last Year: Never true    Ran Out of Food in the Last Year: Never true  Transportation Needs: No Transportation Needs (04/26/2022)   PRAPARE - Administrator, Civil Service (Medical): No    Lack of Transportation (Non-Medical): No  Physical Activity: Inactive (04/15/2022)   Exercise Vital Sign    Days of Exercise per Week: 0 days    Minutes of Exercise per Session: 0 min  Stress: No Stress Concern Present (04/15/2022)   [employer] of Occupational Health - Occupational Stress Questionnaire    Feeling of Stress : Not at all  Social Connections: Moderately Isolated (04/15/2022)   Social Connection and Isolation Panel [NHANES]    Frequency of Communication with Friends and Family: More than three times a week    Frequency of Social Gatherings with Friends and Family: More than three times a week    Attends Religious Services: More than 4 times per year    Active Member of [employer] or Organizations: No    Attends Banker Meetings: Never    Marital Status: Widowed     Family History: The patient's family history includes Breast cancer in his sister; Cancer in his mother; Diabetes in his mother.  ROS:   Please see the history of present illness.     All other systems reviewed and are negative.  EKGs/Labs/Other Studies Reviewed:    The following studies were  reviewed today: Cardiac Studies & Procedures   CARDIAC CATHETERIZATION  CARDIAC CATHETERIZATION 09/15/2020  Narrative   Ost RCA to Prox RCA lesion is 50% stenosed.   Prox LAD lesion is 30% stenosed with  0% stenosed side branch in 1st Diag.   Prox Cx to Mid Cx lesion is 40% stenosed.  1.  Nonobstructive coronary artery disease with mild nonobstructive plaquing in the LAD, left circumflex, and moderate calcific stenosis of the RCA ostium. 2.  Low normal LVEDP  Recommend: No high-grade coronary stenoses identified.  Patient with femur fracture.  He should not be at excessive risk of ischemic cardiac complications.  Findings Coronary Findings Diagnostic  Dominance: Right  Left Main The vessel exhibits minimal luminal irregularities. The left main is patent with no obstructive disease.  The left main trifurcates into the LAD, left circumflex, and a small intermediate branch.  Left Anterior Descending Prox LAD lesion is 30% stenosed with 0% stenosed side branch in 1st Diag. Mild plaquing noted at the first septal perforator with hypodensity and approximately 30% stenosis  Left Circumflex Prox Cx to Mid Cx lesion is 40% stenosed.  Right Coronary Artery There is mild diffuse disease throughout the vessel. Ost RCA to Prox RCA lesion is 50% stenosed. The lesion is calcified. Heavy calcification of the right coronary cusp with moderate nonobstructive plaquing of 50%.  There is good contrast reflux and no pressure dampening with the catheter engaged in the artery.  Intervention  No interventions have been documented.     ECHOCARDIOGRAM  ECHOCARDIOGRAM COMPLETE 04/25/2022  Narrative ECHOCARDIOGRAM REPORT    Patient Name:   DYLAND PANUCO Date of Exam: 04/25/2022 Medical Rec #:  [id number]       Height:       69.0 in Accession #:    [id number]      Weight:       222.0 lb Date of Birth:  [birth year]        BSA:          2.160 m Patient Age:    81 years        BP:           124/93  mmHg Patient Gender: M               HR:           56 bpm. Exam Location:  Inpatient  Procedure: 2D Echo, Color Doppler, Cardiac Doppler and Intracardiac Opacification Agent  Indications:     I50.31 Acute diastolic (congestive) heart failure  History:         Patient has prior history of Echocardiogram examinations, most recent 06/19/2021. CHF, COPD, Arrythmias:Atrial Fibrillation; Risk Factors:Hypertension and Dyslipidemia.  Sonographer:     Damien Senior RDCS Referring Phys:  JUSTIN B HOWERTER Diagnosing Phys: Jerel Balding MD   Sonographer Comments: Technically difficult due to lung interference. IMPRESSIONS   1. Left ventricular ejection fraction, by estimation, is 60 to 65%. The left ventricle has normal function. The left ventricle has no regional wall motion abnormalities. There is mild concentric left ventricular hypertrophy. Left ventricular diastolic parameters are consistent with Grade II diastolic dysfunction (pseudonormalization). 2. Right ventricular systolic function is low normal. The right ventricular size is moderately enlarged. 3. Left atrial size was moderately dilated. 4. Right atrial size was moderately dilated. 5. The mitral valve is degenerative. Mild mitral valve regurgitation. 6. The aortic valve is calcified. There is mild calcification of the aortic valve. There is moderate thickening of the aortic valve. Aortic valve regurgitation is trivial. Mild to moderate aortic valve stenosis. Aortic valve mean gradient measures 17.0 mmHg. Aortic valve Vmax measures 2.82 m/s. 7. The inferior vena cava is normal in size with greater than 50%  respiratory variability, suggesting right atrial pressure of 3 mmHg.  Comparison(s): Prior images reviewed side by side. The left ventricular function has improved. Aortic stenosis is now present.  FINDINGS Left Ventricle: Left ventricular ejection fraction, by estimation, is 60 to 65%. The left ventricle has normal function. The  left ventricle has no regional wall motion abnormalities. Definity  contrast agent was given IV to delineate the left ventricular endocardial borders. The left ventricular internal cavity size was normal in size. There is mild concentric left ventricular hypertrophy. Left ventricular diastolic parameters are consistent with Grade II diastolic dysfunction (pseudonormalization).  Right Ventricle: The right ventricular size is moderately enlarged. Right vetricular wall thickness was not well visualized. Right ventricular systolic function is low normal.  Left Atrium: Left atrial size was moderately dilated.  Right Atrium: Right atrial size was moderately dilated.  Pericardium: There is no evidence of pericardial effusion. Presence of epicardial fat layer.  Mitral Valve: The mitral valve is degenerative in appearance. Mild mitral annular calcification. Mild mitral valve regurgitation.  Tricuspid Valve: The tricuspid valve is grossly normal. Tricuspid valve regurgitation is trivial.  Aortic Valve: The aortic valve is calcified. There is mild calcification of the aortic valve. There is moderate thickening of the aortic valve. Aortic valve regurgitation is trivial. Mild to moderate aortic stenosis is present. Aortic valve mean gradient measures 17.0 mmHg. Aortic valve peak gradient measures 31.8 mmHg. Aortic valve area, by VTI measures 1.53 cm.  Pulmonic Valve: The pulmonic valve was grossly normal. Pulmonic valve regurgitation is not visualized.  Aorta: The aortic root and ascending aorta are structurally normal, with no evidence of dilitation.  Venous: The inferior vena cava is normal in size with greater than 50% respiratory variability, suggesting right atrial pressure of 3 mmHg.  IAS/Shunts: No atrial level shunt detected by color flow Doppler.   LEFT VENTRICLE PLAX 2D LVIDd:         4.00 cm   Diastology LVIDs:         2.60 cm   LV e' medial:    6.53 cm/s LV PW:         1.30 cm   LV E/e'  medial:  12.5 LV IVS:        1.10 cm   LV e' lateral:   7.83 cm/s LVOT diam:     2.30 cm   LV E/e' lateral: 10.4 LV SV:         107 LV SV Index:   50 LVOT Area:     4.15 cm   RIGHT VENTRICLE RV S prime:     11.10 cm/s TAPSE (M-mode): 2.3 cm  LEFT ATRIUM             Index        RIGHT ATRIUM           Index LA diam:        3.80 cm 1.76 cm/m   RA Area:     28.00 cm LA Vol (A2C):   74.9 ml 34.68 ml/m  RA Volume:   94.80 ml  43.89 ml/m LA Vol (A4C):   88.9 ml 41.16 ml/m LA Biplane Vol: 92.3 ml 42.74 ml/m AORTIC VALVE AV Area (Vmax):    1.89 cm AV Area (Vmean):   1.63 cm AV Area (VTI):     1.53 cm AV Vmax:           282.00 cm/s AV Vmean:          195.000 cm/s AV VTI:  0.699 m AV Peak Grad:      31.8 mmHg AV Mean Grad:      17.0 mmHg LVOT Vmax:         128.00 cm/s LVOT Vmean:        76.600 cm/s LVOT VTI:          0.258 m LVOT/AV VTI ratio: 0.37  AORTA Ao Root diam: 3.30 cm Ao Asc diam:  3.30 cm  MITRAL VALVE MV Area (PHT): 3.21 cm    SHUNTS MV Decel Time: 236 msec    Systemic VTI:  0.26 m MV E velocity: 81.70 cm/s  Systemic Diam: 2.30 cm MV A velocity: 84.90 cm/s MV E/A ratio:  0.96  Mihai Croitoru MD Electronically signed by Jerel Balding MD Signature Date/Time: 04/25/2022/3:27:20 PM    Final (Updated)              EKG:  EKG is  ordered today.  The ekg ordered today demonstrates sinus bradycardia, HR 59 bpm, RBBB, LAD, consistent with prior EKG tracings.   Recent Labs: 01/28/2022: NT-Pro BNP 379 04/24/2022: B Natriuretic Peptide 409.6 04/26/2022: Magnesium  2.4 05/05/2022: TSH 3.770 05/27/2022: ALT 44; BUN 15; Creatinine, Ser 1.32; Hemoglobin 11.2; Platelets 284; Potassium 4.9; Sodium 137  Recent Lipid Panel    Component Value Date/Time   CHOL 188 10/14/2021 0827   TRIG 93 10/14/2021 0827   HDL 92 10/14/2021 0827   CHOLHDL 2.0 10/14/2021 0827   LDLCALC 80 10/14/2021 0827     Risk Assessment/Calculations:    CHA2DS2-VASc Score = 5   This  indicates a 7.2% annual risk of stroke. The patient's score is based upon: CHF History: 1 HTN History: 1 Diabetes History: 0 Stroke History: 0 Vascular Disease History: 1 Age Score: 2 Gender Score: 0              Physical Exam:    VS:  BP (!) 168/100 Comment: has not taken his am meds yet  Pulse 66   Ht 5' 9 (1.753 m)   Wt 205 lb 6.4 oz (93.2 kg)   SpO2 93%   BMI 30.33 kg/m     Wt Readings from Last 3 Encounters:  06/03/22 205 lb 6.4 oz (93.2 kg)  05/27/22 208 lb (94.3 kg)  05/20/22 217 lb 6.4 oz (98.6 kg)     GEN: ill appearing, no acute distress, ETOH noted on breath HEENT: Normal NECK: No JVD; No carotid bruits LYMPHATICS: No lymphadenopathy CARDIAC: RRR, no murmurs, rubs, gallops RESPIRATORY:  Trace rales noted, no wheezing ABDOMEN: Soft, non-tender, non-distended MUSCULOSKELETAL:  Ana Liaw appearing legs; No deformity  SKIN: Warm and dry NEUROLOGIC:  Alert and oriented x 3 PSYCHIATRIC:  Normal affect   ASSESSMENT:    1. Heart failure with preserved ejection fraction, unspecified HF chronicity (HCC)   2. Paroxysmal atrial fibrillation (HCC)   3. Coronary artery disease involving native coronary artery of native heart without angina pectoris   4. ETOH abuse   5. Hyponatremia   6. Secondary hypercoagulable state (HCC)   7. Tobacco abuse     PLAN:    In order of problems listed above:  HFpEF - NYHA class II today, euvolemic. Weight is 205 in office today, was 217 at last OV two weeks ago.  He is weighing himself daily.  He has reduced his intake of beer, replaced with water. We discussed fluid restriction and salt restriction. Continue Jardiance  10 mg daily, continue metoprolol  12.5 mg daily, continue Aldactone  25 mg daily, continue Demadex  20 mg  twice daily. Losartan  was held at most recent hospital d/c d/t AKI. Most recent Cr is elevated, PCP wants to recheck in 1 month. Would like to start him on Entresto, however will wait to see if kidney function  returns to baseline.  CAD-LHC in 2022 revealed nonobstructive CAD with mild nonobstructive plaquing in the LAD, LCx, moderate calcification of the RCA. Stable with no anginal symptoms. No indication for ischemic evaluation.  Continue Lipitor 40 mg daily, continue Toprol  12.5 mg daily. PAF-CHA2DS2-VASc score 5, continue Eliquis  5 mg twice daily--no indication for dose reduction, continue Toprol  12.5 mg daily. Denies hematochezia, hematuria, hemoptysis.  Hyponatremia-resolved, most recent sodium 137. EtOH abuse-he has made concerted efforts to cut back on his drinking, is only drinking 2-3 beers per day.  Congratulated him on that, encouraged him to further cut back. Tobacco abuse - smokes 1-2 PPD, not interested in stopping. He does not smoke while wearing his oxygen  HS, he is adamant that he is careful. Total cessation encouraged.    Disposition -return in 2 months.       Medication Adjustments/Labs and Tests Ordered: Current medicines are reviewed at length with the patient today.  Concerns regarding medicines are outlined above.  No orders of the defined types were placed in this encounter.  No orders of the defined types were placed in this encounter.   Patient Instructions  Medication Instructions:  Your physician recommends that you continue on your current medications as directed. Please refer to the Current Medication list given to you today.  *If you need a refill on your cardiac medications before your next appointment, please call your pharmacy*   Lab Work: NONE If you have labs (blood work) drawn today and your tests are completely normal, you will receive your results only by: MyChart Message (if you have MyChart) OR A paper copy in the mail If you have any lab test that is abnormal or we need to change your treatment, we will call you to review the results.   Testing/Procedures: NONE   Follow-Up: At [facility], you and your health needs are our priority.   As part of our continuing mission to provide you with exceptional heart care, we have created designated Provider Care Teams.  These Care Teams include your primary Cardiologist (physician) and Advanced Practice Providers (APPs -  Physician Assistants and Nurse Practitioners) who all work together to provide you with the care you need, when you need it.  We recommend signing up for the patient portal called MyChart.  Sign up information is provided on this After Visit Summary.  MyChart is used to connect with patients for Virtual Visits (Telemedicine).  Patients are able to view lab/test results, encounter notes, upcoming appointments, etc.  Non-urgent messages can be sent to your provider as well.   To learn more about what you can do with MyChart, go to [email or web address].    Your next appointment:   2 month(s)  Provider:   Redell Leiter, MD    Other Instructions     Signed, Delon JAYSON Hoover, NP  06/03/2022 8:59 AM    [facility] "

## 2022-06-03 ENCOUNTER — Encounter: Payer: Self-pay | Admitting: Cardiology

## 2022-06-03 ENCOUNTER — Ambulatory Visit: Payer: Medicare HMO | Attending: Cardiology | Admitting: Cardiology

## 2022-06-03 VITALS — BP 168/100 | HR 66 | Ht 69.0 in | Wt 205.4 lb

## 2022-06-03 DIAGNOSIS — I480 Paroxysmal atrial fibrillation: Secondary | ICD-10-CM

## 2022-06-03 DIAGNOSIS — I2510 Atherosclerotic heart disease of native coronary artery without angina pectoris: Secondary | ICD-10-CM

## 2022-06-03 DIAGNOSIS — F1010 Alcohol abuse, uncomplicated: Secondary | ICD-10-CM | POA: Diagnosis not present

## 2022-06-03 DIAGNOSIS — E871 Hypo-osmolality and hyponatremia: Secondary | ICD-10-CM

## 2022-06-03 DIAGNOSIS — D6869 Other thrombophilia: Secondary | ICD-10-CM

## 2022-06-03 DIAGNOSIS — Z720 Tobacco use: Secondary | ICD-10-CM | POA: Diagnosis not present

## 2022-06-03 DIAGNOSIS — I5030 Unspecified diastolic (congestive) heart failure: Secondary | ICD-10-CM | POA: Diagnosis not present

## 2022-06-03 NOTE — Patient Instructions (Signed)
Medication Instructions:  Your physician recommends that you continue on your current medications as directed. Please refer to the Current Medication list given to you today.  *If you need a refill on your cardiac medications before your next appointment, please call your pharmacy*   Lab Work: NONE If you have labs (blood work) drawn today and your tests are completely normal, you will receive your results only by: MyChart Message (if you have MyChart) OR A paper copy in the mail If you have any lab test that is abnormal or we need to change your treatment, we will call you to review the results.   Testing/Procedures: NONE   Follow-Up: At [facility], you and your health needs are our priority.  As part of our continuing mission to provide you with exceptional heart care, we have created designated Provider Care Teams.  These Care Teams include your primary Cardiologist (physician) and Advanced Practice Providers (APPs -  Physician Assistants and Nurse Practitioners) who all work together to provide you with the care you need, when you need it.  We recommend signing up for the patient portal called "MyChart".  Sign up information is provided on this After Visit Summary.  MyChart is used to connect with patients for Virtual Visits (Telemedicine).  Patients are able to view lab/test results, encounter notes, upcoming appointments, etc.  Non-urgent messages can be sent to your provider as well.   To learn more about what you can do with MyChart, go to [email or web address].    Your next appointment:   2 month(s)  Provider:   Brian Munley, MD    Other Instructions   

## 2022-06-06 ENCOUNTER — Encounter: Payer: Self-pay | Admitting: Family Medicine

## 2022-06-06 DIAGNOSIS — J301 Allergic rhinitis due to pollen: Secondary | ICD-10-CM | POA: Insufficient documentation

## 2022-06-06 HISTORY — DX: Allergic rhinitis due to pollen: J30.1

## 2022-06-06 NOTE — Assessment & Plan Note (Signed)
Continue trelegy one inhalation daily.  Continue albuterol hfa 2 puffs four times a day as needed.  Kenalog 80 mg I'm.

## 2022-06-06 NOTE — Assessment & Plan Note (Signed)
Recommend continue to work on eating healthy diet and exercise.  

## 2022-06-06 NOTE — Assessment & Plan Note (Signed)
Start singulair 10 mg daily.  Kenalog 80 mg IM.

## 2022-06-06 NOTE — Assessment & Plan Note (Signed)
Continue toprol xl, spironalctone, and torsemide.

## 2022-06-07 ENCOUNTER — Encounter (HOSPITAL_COMMUNITY): Payer: Self-pay

## 2022-06-07 DIAGNOSIS — I110 Hypertensive heart disease with heart failure: Secondary | ICD-10-CM | POA: Diagnosis not present

## 2022-06-07 DIAGNOSIS — F1020 Alcohol dependence, uncomplicated: Secondary | ICD-10-CM | POA: Diagnosis not present

## 2022-06-07 DIAGNOSIS — A419 Sepsis, unspecified organism: Secondary | ICD-10-CM | POA: Diagnosis not present

## 2022-06-07 DIAGNOSIS — I4510 Unspecified right bundle-branch block: Secondary | ICD-10-CM | POA: Diagnosis not present

## 2022-06-07 DIAGNOSIS — R0602 Shortness of breath: Secondary | ICD-10-CM | POA: Diagnosis not present

## 2022-06-07 DIAGNOSIS — I4891 Unspecified atrial fibrillation: Secondary | ICD-10-CM | POA: Diagnosis not present

## 2022-06-07 DIAGNOSIS — J441 Chronic obstructive pulmonary disease with (acute) exacerbation: Secondary | ICD-10-CM | POA: Diagnosis not present

## 2022-06-07 DIAGNOSIS — N390 Urinary tract infection, site not specified: Secondary | ICD-10-CM | POA: Diagnosis not present

## 2022-06-07 DIAGNOSIS — E871 Hypo-osmolality and hyponatremia: Secondary | ICD-10-CM | POA: Diagnosis not present

## 2022-06-07 DIAGNOSIS — Z9981 Dependence on supplemental oxygen: Secondary | ICD-10-CM | POA: Diagnosis not present

## 2022-06-07 DIAGNOSIS — R079 Chest pain, unspecified: Secondary | ICD-10-CM | POA: Diagnosis not present

## 2022-06-07 DIAGNOSIS — I440 Atrioventricular block, first degree: Secondary | ICD-10-CM | POA: Diagnosis not present

## 2022-06-07 DIAGNOSIS — R0789 Other chest pain: Secondary | ICD-10-CM | POA: Diagnosis not present

## 2022-06-07 DIAGNOSIS — N1330 Unspecified hydronephrosis: Secondary | ICD-10-CM | POA: Diagnosis not present

## 2022-06-07 DIAGNOSIS — F17210 Nicotine dependence, cigarettes, uncomplicated: Secondary | ICD-10-CM | POA: Diagnosis not present

## 2022-06-07 DIAGNOSIS — I5032 Chronic diastolic (congestive) heart failure: Secondary | ICD-10-CM | POA: Diagnosis not present

## 2022-06-07 DIAGNOSIS — R9431 Abnormal electrocardiogram [ECG] [EKG]: Secondary | ICD-10-CM | POA: Diagnosis not present

## 2022-06-07 DIAGNOSIS — J9611 Chronic respiratory failure with hypoxia: Secondary | ICD-10-CM | POA: Diagnosis not present

## 2022-06-07 DIAGNOSIS — N179 Acute kidney failure, unspecified: Secondary | ICD-10-CM | POA: Diagnosis not present

## 2022-06-07 DIAGNOSIS — R109 Unspecified abdominal pain: Secondary | ICD-10-CM | POA: Diagnosis not present

## 2022-06-07 DIAGNOSIS — I714 Abdominal aortic aneurysm, without rupture, unspecified: Secondary | ICD-10-CM | POA: Diagnosis not present

## 2022-06-07 DIAGNOSIS — D649 Anemia, unspecified: Secondary | ICD-10-CM | POA: Diagnosis not present

## 2022-06-07 DIAGNOSIS — R0600 Dyspnea, unspecified: Secondary | ICD-10-CM | POA: Diagnosis not present

## 2022-06-07 DIAGNOSIS — D72829 Elevated white blood cell count, unspecified: Secondary | ICD-10-CM | POA: Diagnosis not present

## 2022-06-07 DIAGNOSIS — N135 Crossing vessel and stricture of ureter without hydronephrosis: Secondary | ICD-10-CM | POA: Diagnosis not present

## 2022-06-07 DIAGNOSIS — N202 Calculus of kidney with calculus of ureter: Secondary | ICD-10-CM | POA: Diagnosis not present

## 2022-06-07 DIAGNOSIS — Z7901 Long term (current) use of anticoagulants: Secondary | ICD-10-CM | POA: Diagnosis not present

## 2022-06-07 DIAGNOSIS — E872 Acidosis, unspecified: Secondary | ICD-10-CM | POA: Diagnosis not present

## 2022-06-08 ENCOUNTER — Inpatient Hospital Stay (HOSPITAL_COMMUNITY): Payer: Medicare HMO

## 2022-06-08 ENCOUNTER — Inpatient Hospital Stay (HOSPITAL_COMMUNITY)
Admit: 2022-06-08 | Discharge: 2022-06-13 | DRG: 871 | Disposition: A | Discharge status: Home Health | Payer: Medicare HMO | Source: Other Acute Inpatient Hospital | Attending: Internal Medicine | Admitting: Internal Medicine

## 2022-06-08 DIAGNOSIS — Z23 Encounter for immunization: Secondary | ICD-10-CM | POA: Diagnosis not present

## 2022-06-08 DIAGNOSIS — Z7951 Long term (current) use of inhaled steroids: Secondary | ICD-10-CM | POA: Diagnosis not present

## 2022-06-08 DIAGNOSIS — W06XXXA Fall from bed, initial encounter: Secondary | ICD-10-CM | POA: Diagnosis not present

## 2022-06-08 DIAGNOSIS — E785 Hyperlipidemia, unspecified: Secondary | ICD-10-CM | POA: Diagnosis not present

## 2022-06-08 DIAGNOSIS — Z9981 Dependence on supplemental oxygen: Secondary | ICD-10-CM

## 2022-06-08 DIAGNOSIS — N210 Calculus in bladder: Secondary | ICD-10-CM | POA: Diagnosis not present

## 2022-06-08 DIAGNOSIS — E039 Hypothyroidism, unspecified: Secondary | ICD-10-CM | POA: Diagnosis present

## 2022-06-08 DIAGNOSIS — Z7984 Long term (current) use of oral hypoglycemic drugs: Secondary | ICD-10-CM

## 2022-06-08 DIAGNOSIS — N136 Pyonephrosis: Secondary | ICD-10-CM | POA: Diagnosis present

## 2022-06-08 DIAGNOSIS — Y92230 Patient room in hospital as the place of occurrence of the external cause: Secondary | ICD-10-CM | POA: Diagnosis not present

## 2022-06-08 DIAGNOSIS — D649 Anemia, unspecified: Secondary | ICD-10-CM | POA: Diagnosis present

## 2022-06-08 DIAGNOSIS — Z79890 Hormone replacement therapy (postmenopausal): Secondary | ICD-10-CM

## 2022-06-08 DIAGNOSIS — I5032 Chronic diastolic (congestive) heart failure: Secondary | ICD-10-CM

## 2022-06-08 DIAGNOSIS — J449 Chronic obstructive pulmonary disease, unspecified: Secondary | ICD-10-CM | POA: Diagnosis present

## 2022-06-08 DIAGNOSIS — F1020 Alcohol dependence, uncomplicated: Secondary | ICD-10-CM | POA: Diagnosis not present

## 2022-06-08 DIAGNOSIS — D509 Iron deficiency anemia, unspecified: Secondary | ICD-10-CM | POA: Diagnosis present

## 2022-06-08 DIAGNOSIS — F17210 Nicotine dependence, cigarettes, uncomplicated: Secondary | ICD-10-CM | POA: Diagnosis present

## 2022-06-08 DIAGNOSIS — I5033 Acute on chronic diastolic (congestive) heart failure: Secondary | ICD-10-CM | POA: Diagnosis present

## 2022-06-08 DIAGNOSIS — J441 Chronic obstructive pulmonary disease with (acute) exacerbation: Secondary | ICD-10-CM | POA: Diagnosis not present

## 2022-06-08 DIAGNOSIS — B9620 Unspecified Escherichia coli [E. coli] as the cause of diseases classified elsewhere: Secondary | ICD-10-CM | POA: Diagnosis not present

## 2022-06-08 DIAGNOSIS — A419 Sepsis, unspecified organism: Secondary | ICD-10-CM | POA: Diagnosis not present

## 2022-06-08 DIAGNOSIS — F1014 Alcohol abuse with alcohol-induced mood disorder: Secondary | ICD-10-CM | POA: Diagnosis not present

## 2022-06-08 DIAGNOSIS — N1330 Unspecified hydronephrosis: Secondary | ICD-10-CM | POA: Diagnosis not present

## 2022-06-08 DIAGNOSIS — Z0389 Encounter for observation for other suspected diseases and conditions ruled out: Secondary | ICD-10-CM | POA: Diagnosis not present

## 2022-06-08 DIAGNOSIS — N135 Crossing vessel and stricture of ureter without hydronephrosis: Secondary | ICD-10-CM | POA: Diagnosis not present

## 2022-06-08 DIAGNOSIS — Z66 Do not resuscitate: Secondary | ICD-10-CM | POA: Diagnosis present

## 2022-06-08 DIAGNOSIS — Z043 Encounter for examination and observation following other accident: Secondary | ICD-10-CM | POA: Diagnosis not present

## 2022-06-08 DIAGNOSIS — I714 Abdominal aortic aneurysm, without rupture, unspecified: Secondary | ICD-10-CM | POA: Diagnosis not present

## 2022-06-08 DIAGNOSIS — D72829 Elevated white blood cell count, unspecified: Secondary | ICD-10-CM | POA: Diagnosis not present

## 2022-06-08 DIAGNOSIS — N201 Calculus of ureter: Secondary | ICD-10-CM | POA: Diagnosis present

## 2022-06-08 DIAGNOSIS — J9611 Chronic respiratory failure with hypoxia: Secondary | ICD-10-CM | POA: Diagnosis not present

## 2022-06-08 DIAGNOSIS — R140 Abdominal distension (gaseous): Secondary | ICD-10-CM | POA: Diagnosis not present

## 2022-06-08 DIAGNOSIS — E119 Type 2 diabetes mellitus without complications: Secondary | ICD-10-CM

## 2022-06-08 DIAGNOSIS — E871 Hypo-osmolality and hyponatremia: Secondary | ICD-10-CM | POA: Diagnosis not present

## 2022-06-08 DIAGNOSIS — I4891 Unspecified atrial fibrillation: Secondary | ICD-10-CM | POA: Diagnosis not present

## 2022-06-08 DIAGNOSIS — I4510 Unspecified right bundle-branch block: Secondary | ICD-10-CM | POA: Diagnosis not present

## 2022-06-08 DIAGNOSIS — Z7901 Long term (current) use of anticoagulants: Secondary | ICD-10-CM

## 2022-06-08 DIAGNOSIS — S0990XA Unspecified injury of head, initial encounter: Secondary | ICD-10-CM | POA: Diagnosis not present

## 2022-06-08 DIAGNOSIS — E861 Hypovolemia: Secondary | ICD-10-CM | POA: Diagnosis not present

## 2022-06-08 DIAGNOSIS — N390 Urinary tract infection, site not specified: Secondary | ICD-10-CM | POA: Diagnosis not present

## 2022-06-08 DIAGNOSIS — R0602 Shortness of breath: Secondary | ICD-10-CM | POA: Diagnosis not present

## 2022-06-08 DIAGNOSIS — S01111A Laceration without foreign body of right eyelid and periocular area, initial encounter: Secondary | ICD-10-CM | POA: Diagnosis not present

## 2022-06-08 DIAGNOSIS — N400 Enlarged prostate without lower urinary tract symptoms: Secondary | ICD-10-CM | POA: Diagnosis present

## 2022-06-08 DIAGNOSIS — I440 Atrioventricular block, first degree: Secondary | ICD-10-CM | POA: Diagnosis not present

## 2022-06-08 DIAGNOSIS — Z79899 Other long term (current) drug therapy: Secondary | ICD-10-CM

## 2022-06-08 DIAGNOSIS — D638 Anemia in other chronic diseases classified elsewhere: Secondary | ICD-10-CM | POA: Diagnosis not present

## 2022-06-08 DIAGNOSIS — I7143 Infrarenal abdominal aortic aneurysm, without rupture: Secondary | ICD-10-CM | POA: Diagnosis not present

## 2022-06-08 DIAGNOSIS — I5043 Acute on chronic combined systolic (congestive) and diastolic (congestive) heart failure: Secondary | ICD-10-CM | POA: Diagnosis not present

## 2022-06-08 DIAGNOSIS — E872 Acidosis, unspecified: Secondary | ICD-10-CM | POA: Diagnosis not present

## 2022-06-08 DIAGNOSIS — N179 Acute kidney failure, unspecified: Secondary | ICD-10-CM | POA: Diagnosis not present

## 2022-06-08 DIAGNOSIS — M1990 Unspecified osteoarthritis, unspecified site: Secondary | ICD-10-CM | POA: Diagnosis present

## 2022-06-08 DIAGNOSIS — Z833 Family history of diabetes mellitus: Secondary | ICD-10-CM

## 2022-06-08 DIAGNOSIS — R109 Unspecified abdominal pain: Secondary | ICD-10-CM | POA: Diagnosis not present

## 2022-06-08 DIAGNOSIS — I480 Paroxysmal atrial fibrillation: Secondary | ICD-10-CM | POA: Diagnosis not present

## 2022-06-08 DIAGNOSIS — Z6832 Body mass index (BMI) 32.0-32.9, adult: Secondary | ICD-10-CM | POA: Diagnosis not present

## 2022-06-08 DIAGNOSIS — K219 Gastro-esophageal reflux disease without esophagitis: Secondary | ICD-10-CM | POA: Diagnosis present

## 2022-06-08 DIAGNOSIS — R0789 Other chest pain: Secondary | ICD-10-CM | POA: Diagnosis not present

## 2022-06-08 DIAGNOSIS — R9431 Abnormal electrocardiogram [ECG] [EKG]: Secondary | ICD-10-CM | POA: Diagnosis not present

## 2022-06-08 DIAGNOSIS — I110 Hypertensive heart disease with heart failure: Secondary | ICD-10-CM | POA: Diagnosis present

## 2022-06-08 DIAGNOSIS — I70213 Atherosclerosis of native arteries of extremities with intermittent claudication, bilateral legs: Secondary | ICD-10-CM | POA: Diagnosis present

## 2022-06-08 DIAGNOSIS — E669 Obesity, unspecified: Secondary | ICD-10-CM | POA: Diagnosis present

## 2022-06-08 HISTORY — DX: Sepsis, unspecified organism: A41.9

## 2022-06-08 HISTORY — DX: Type 2 diabetes mellitus without complications: E11.9

## 2022-06-08 HISTORY — DX: Calculus of ureter: N20.1

## 2022-06-08 HISTORY — DX: Acute kidney failure, unspecified: N17.9

## 2022-06-08 LAB — BASIC METABOLIC PANEL
Anion gap: 10 (ref 5–15)
BUN: 39 mg/dL — ABNORMAL HIGH (ref 8–23)
CO2: 20 mmol/L — ABNORMAL LOW (ref 22–32)
Calcium: 8.5 mg/dL — ABNORMAL LOW (ref 8.9–10.3)
Chloride: 95 mmol/L — ABNORMAL LOW (ref 98–111)
Creatinine, Ser: 2.34 mg/dL — ABNORMAL HIGH (ref 0.61–1.24)
GFR, Estimated: 27 mL/min — ABNORMAL LOW (ref >60)
Glucose, Bld: 88 mg/dL (ref 70–99)
Potassium: 5.1 mmol/L (ref 3.5–5.1)
Sodium: 125 mmol/L — ABNORMAL LOW (ref 135–145)

## 2022-06-08 LAB — CBC
HCT: 30 % — ABNORMAL LOW (ref 39.0–52.0)
Hemoglobin: 9.5 g/dL — ABNORMAL LOW (ref 13.0–17.0)
MCH: 27.2 pg (ref 26.0–34.0)
MCHC: 31.7 g/dL (ref 30.0–36.0)
MCV: 86 fL (ref 80.0–100.0)
Platelets: 260 10*3/uL (ref 150–400)
RBC: 3.49 MIL/uL — ABNORMAL LOW (ref 4.22–5.81)
RDW: 16 % — ABNORMAL HIGH (ref 11.5–15.5)
WBC: 21.5 10*3/uL — ABNORMAL HIGH (ref 4.0–10.5)
nRBC: 0 % (ref 0.0–0.2)

## 2022-06-08 LAB — MAGNESIUM: Magnesium: 2.1 mg/dL (ref 1.7–2.4)

## 2022-06-08 LAB — GLUCOSE, CAPILLARY: Glucose-Capillary: 87 mg/dL (ref 70–99)

## 2022-06-08 LAB — LACTIC ACID, PLASMA: Lactic Acid, Venous: 1.2 mmol/L (ref 0.5–1.9)

## 2022-06-08 MED ORDER — PANTOPRAZOLE SODIUM 40 MG PO TBEC
40.0000 mg | DELAYED_RELEASE_TABLET | Freq: Every day | ORAL | Status: DC
  Administered 2022-06-09 – 2022-06-13 (×5): 40 mg via ORAL
  Filled 2022-06-08 (×5): qty 1

## 2022-06-08 MED ORDER — INSULIN ASPART 100 UNIT/ML IJ SOLN: 0.0000 [IU] | Freq: Every day | INTRAMUSCULAR | Status: DC

## 2022-06-08 MED ORDER — ACETAMINOPHEN 650 MG RE SUPP: 650.0000 mg | Freq: Four times a day (QID) | RECTAL | Status: DC | PRN

## 2022-06-08 MED ORDER — ACETAMINOPHEN 325 MG PO TABS
650.0000 mg | ORAL_TABLET | Freq: Four times a day (QID) | ORAL | Status: DC | PRN
  Administered 2022-06-11: 650 mg via ORAL
  Filled 2022-06-08: qty 2

## 2022-06-08 MED ORDER — LORAZEPAM 2 MG/ML IJ SOLN: 0.0000 mg | Freq: Three times a day (TID) | INTRAMUSCULAR | Status: AC

## 2022-06-08 MED ORDER — LORAZEPAM 2 MG/ML IJ SOLN: 1.0000 mg | INTRAMUSCULAR | Status: AC | PRN

## 2022-06-08 MED ORDER — LORAZEPAM 1 MG PO TABS: 1.0000 mg | ORAL_TABLET | ORAL | Status: AC | PRN

## 2022-06-08 MED ORDER — PROCHLORPERAZINE EDISYLATE 10 MG/2ML IJ SOLN: 5.0000 mg | Freq: Four times a day (QID) | INTRAMUSCULAR | Status: DC | PRN

## 2022-06-08 MED ORDER — SODIUM CHLORIDE 0.9 % IV SOLN
2.0000 g | INTRAVENOUS | Status: DC
  Administered 2022-06-08 – 2022-06-12 (×5): 2 g via INTRAVENOUS
  Filled 2022-06-08 (×5): qty 20

## 2022-06-08 MED ORDER — SODIUM CHLORIDE 0.9 % IV BOLUS (SEPSIS)
500.0000 mL | Freq: Once | INTRAVENOUS | Status: AC
  Administered 2022-06-08: 500 mL via INTRAVENOUS

## 2022-06-08 MED ORDER — MELATONIN 3 MG PO TABS
3.0000 mg | ORAL_TABLET | Freq: Every evening | ORAL | Status: DC | PRN
  Administered 2022-06-08 – 2022-06-13 (×4): 3 mg via ORAL
  Filled 2022-06-08 (×4): qty 1

## 2022-06-08 MED ORDER — THIAMINE HCL 100 MG/ML IJ SOLN
100.0000 mg | Freq: Every day | INTRAMUSCULAR | Status: DC
  Filled 2022-06-08: qty 2

## 2022-06-08 MED ORDER — FOLIC ACID 1 MG PO TABS
1.0000 mg | ORAL_TABLET | Freq: Every day | ORAL | Status: DC
  Administered 2022-06-08 – 2022-06-13 (×6): 1 mg via ORAL
  Filled 2022-06-08 (×6): qty 1

## 2022-06-08 MED ORDER — THIAMINE MONONITRATE 100 MG PO TABS
100.0000 mg | ORAL_TABLET | Freq: Every day | ORAL | Status: DC
  Administered 2022-06-08 – 2022-06-13 (×6): 100 mg via ORAL
  Filled 2022-06-08 (×6): qty 1

## 2022-06-08 MED ORDER — ADULT MULTIVITAMIN W/MINERALS CH
1.0000 | ORAL_TABLET | Freq: Every day | ORAL | Status: DC
  Administered 2022-06-08 – 2022-06-13 (×6): 1 via ORAL
  Filled 2022-06-08 (×6): qty 1

## 2022-06-08 MED ORDER — SODIUM CHLORIDE 0.9 % IV SOLN: INTRAVENOUS | Status: AC

## 2022-06-08 MED ORDER — IPRATROPIUM-ALBUTEROL 0.5-2.5 (3) MG/3ML IN SOLN
3.0000 mL | Freq: Four times a day (QID) | RESPIRATORY_TRACT | Status: DC | PRN
  Administered 2022-06-09: 3 mL via RESPIRATORY_TRACT
  Filled 2022-06-08: qty 3

## 2022-06-08 MED ORDER — FLUTICASONE FUROATE-VILANTEROL 200-25 MCG/ACT IN AEPB
1.0000 | INHALATION_SPRAY | Freq: Every day | RESPIRATORY_TRACT | Status: DC
  Administered 2022-06-09: 1 via RESPIRATORY_TRACT
  Filled 2022-06-08: qty 28

## 2022-06-08 MED ORDER — OXYCODONE HCL 5 MG PO TABS
5.0000 mg | ORAL_TABLET | Freq: Four times a day (QID) | ORAL | Status: DC | PRN
  Administered 2022-06-08: 5 mg via ORAL
  Filled 2022-06-08: qty 1

## 2022-06-08 MED ORDER — INSULIN ASPART 100 UNIT/ML IJ SOLN
0.0000 [IU] | Freq: Three times a day (TID) | INTRAMUSCULAR | Status: DC
  Administered 2022-06-10: 1 [IU] via SUBCUTANEOUS
  Administered 2022-06-12 – 2022-06-13 (×2): 2 [IU] via SUBCUTANEOUS

## 2022-06-08 MED ORDER — LORAZEPAM 2 MG/ML IJ SOLN
0.0000 mg | INTRAMUSCULAR | Status: AC
  Administered 2022-06-08: 1 mg via INTRAVENOUS
  Administered 2022-06-09: 2 mg via INTRAVENOUS
  Filled 2022-06-08 (×2): qty 1

## 2022-06-08 MED ORDER — UMECLIDINIUM BROMIDE 62.5 MCG/ACT IN AEPB
1.0000 | INHALATION_SPRAY | Freq: Every day | RESPIRATORY_TRACT | Status: DC
  Administered 2022-06-09: 1 via RESPIRATORY_TRACT
  Filled 2022-06-08: qty 7

## 2022-06-08 MED ORDER — ATORVASTATIN CALCIUM 40 MG PO TABS
40.0000 mg | ORAL_TABLET | Freq: Every day | ORAL | Status: DC
  Administered 2022-06-09 – 2022-06-13 (×5): 40 mg via ORAL
  Filled 2022-06-08 (×5): qty 1

## 2022-06-08 MED ORDER — TAMSULOSIN HCL 0.4 MG PO CAPS
0.4000 mg | ORAL_CAPSULE | Freq: Every day | ORAL | Status: DC
  Administered 2022-06-09 – 2022-06-13 (×5): 0.4 mg via ORAL
  Filled 2022-06-08 (×5): qty 1

## 2022-06-08 MED ORDER — LEVOTHYROXINE SODIUM 50 MCG PO TABS
50.0000 ug | ORAL_TABLET | Freq: Every day | ORAL | Status: DC
  Administered 2022-06-09 – 2022-06-13 (×5): 50 ug via ORAL
  Filled 2022-06-08 (×5): qty 1

## 2022-06-08 MED ORDER — SODIUM CHLORIDE 0.9% FLUSH
3.0000 mL | Freq: Two times a day (BID) | INTRAVENOUS | Status: DC
  Administered 2022-06-08 – 2022-06-13 (×10): 3 mL via INTRAVENOUS

## 2022-06-08 NOTE — H&P (Signed)
History and Physical    Kyle Farley ZOX:[id number] DOB: 12-28-1940 DOA: 06/08/2022  PCP: Blane Ohara, MD   Patient coming from: Home   Chief Complaint: Right flank pain   HPI: Kyle Farley is a 82 y.o. male with medical history significant for PAF on Eliquis, chronic diastolic CHF, COPD, chronic bronchitic respiratory failure, alcohol abuse, ongoing tobacco abuse, and hypothyroidism who presented to [facility] ED yesterday with ~1 week of right flank pain.  Patient reports approximately 1 week of right flank pain and dysuria without gross hematuria, fever, or chills.  Pain became unbearable yesterday, prompting him to seek evaluation at an urgent care.  From urgent care, he was sent to [facility] ED.    He reports 2 episodes of nonbloody vomiting yesterday that he attributed to the severe pain.  He denies any change in his chronic shortness of breath or chronic cough, and notes that his legs are not currently swollen as they previously had been.  He denies fevers, chills, hematuria, melena, or hematochezia.  [facility] ED Course: Upon arrival to the ED, patient is found to be afebrile, saturating low 90s, slightly tachypneic, and with blood pressure of 98/55.  Labs were most notable for lactic acid 4.4 (later cleared to 1.0), sodium 123, creatinine 1.70, albumin 2.7, procalcitonin 3.97, normal troponin, and WBC 40,800 (down to 28,800 on recheck).  CT of the chest, abdomen, and pelvis was most notable for moderate right hydroureteronephrosis extending to a 6 mm UVJ stone.  Patient was given 2 L of IV fluids, cefepime, vancomycin, morphine, Zofran, vitamins, albuterol, metoprolol, Flomax, and oral amiodarone in the ED.  The patient was transferred to [facility] for admission due to absence of urology services at [facility].  Review of Systems:  All other systems reviewed and apart from HPI, are negative.  Past Medical History:  Diagnosis Date    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 (HCC) 11/10/2021   Acquired hypothyroidism 11/10/2021   Acute hypoxic respiratory failure (HCC) 04/25/2022   Acute on chronic diastolic heart failure (HCC) 04/25/2022   Acute on chronic systolic heart failure (HCC) 11/10/2021   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/10/2021   Alcohol use 09/12/2020   Anemia of chronic disease 04/25/2022   Arthritis    Atherosclerosis of native arteries of extremities with intermittent claudication, bilateral legs (HCC) 11/10/2021   Bilateral leg edema 02/16/2022   BPH (benign prostatic hyperplasia) 11/10/2021   CHF (congestive heart failure) (HCC)    Chronic hyponatremia 04/25/2022   Chronic respiratory failure with hypoxia (HCC) 05/09/2022   Cigarette smoker 10/21/2014   Class 1 obesity 04/25/2022   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)    Dyspnea    Dysrhythmia    atrial fibrillation   Encounter for prostate cancer screening 02/16/2022   Encounter for screening for lung cancer 02/16/2022   Essential hypertension 10/21/2014   Femur fracture, right (HCC) 09/12/2020   GERD (gastroesophageal reflux disease)    History of kidney stones    Hyperlipidemia 01/24/2017   Hypertension    Hyponatremia 04/26/2022   Kidney stones 01/24/2017   Paroxysmal atrial fibrillation (HCC) 10/21/2014   Pressure injury of skin 09/16/2020   SOB (shortness of breath) 05/10/2022   Tobacco abuse 02/16/2022    Past Surgical History:  Procedure Laterality Date   LEFT HEART CATH AND CORONARY ANGIOGRAPHY N/A 09/15/2020   Procedure: LEFT HEART CATH AND CORONARY ANGIOGRAPHY;  Surgeon: Tonny Bollman, MD;  Location: [facility] INVASIVE CV  LAB;  Service: Cardiovascular;  Laterality: N/A;   LEG SURGERY Right    steal pin placed in right leg 35 years ago   ORIF FEMUR FRACTURE Right 09/15/2020   Procedure: OPEN REDUCTION INTERNAL FIXATION FEMORAL SHAFT FRACTURE;  Surgeon: Roby Lofts, MD;  Location: [facility] OR;   Service: Orthopedics;  Laterality: Right;    Social History:   reports that he has been smoking cigarettes and cigars. He has a 62.00 pack-year smoking history. He has never used smokeless tobacco. He reports current alcohol use of about 5.0 standard drinks of alcohol per week. He reports that he does not use drugs.  No Known Allergies  Family History  Problem Relation Age of Onset   Cancer Mother    Diabetes Mother    Breast cancer Sister      Prior to Admission medications   Medication Sig Start Date End Date Taking? Authorizing Provider  albuterol (VENTOLIN HFA) 108 (90 Base) MCG/ACT inhaler Inhale 2 puffs into the lungs every 4 (four) hours as needed for wheezing or shortness of breath. 05/05/22   [name], [name], MD  apixaban (ELIQUIS) 5 MG TABS tablet Take 1 tablet (5 mg total) by mouth 2 (two) times daily. 05/20/22   Flossie Dibble, NP  atorvastatin (LIPITOR) 40 MG tablet Take 1 tablet (40 mg total) by mouth daily. 12/22/21   Gaston Islam., NP  diphenhydrAMINE HCl, Sleep, (ZZZQUIL PO) Take 1 tablet by mouth at bedtime.    [provider]  empagliflozin (JARDIANCE) 10 MG TABS tablet Take 1 tablet (10 mg total) by mouth daily. 04/30/22   Zannie Cove, MD  Fluticasone-Umeclidin-Vilant (TRELEGY ELLIPTA) 200-62.5-25 MCG/ACT AEPB Inhale 2 puffs into the lungs daily. 04/02/22   Lurline Del, FNP  ipratropium-albuterol (DUONEB) 0.5-2.5 (3) MG/3ML SOLN Inhale 3 mLs into the lungs every 6 (six) hours as needed. 04/06/22   [name], [name], MD  levothyroxine (SYNTHROID) 50 MCG tablet Take 50 mcg by mouth daily before breakfast.    [provider]  metoprolol succinate (TOPROL-XL) 25 MG 24 hr tablet Take 12.5 mg by mouth daily.    [provider]  montelukast (SINGULAIR) 10 MG tablet Take 1 tablet (10 mg total) by mouth daily at 12 noon. 05/27/22   CoxFritzi Mandes, MD  OXYGEN Inhale 3 L into the lungs as needed (shortness of breath).    [provider]   pantoprazole (PROTONIX) 40 MG tablet Take 1 tablet (40 mg total) by mouth daily. 04/30/22   Zannie Cove, MD  spironolactone (ALDACTONE) 25 MG tablet Take 1 tablet (25 mg total) by mouth daily. 04/30/22   Zannie Cove, MD  tamsulosin (FLOMAX) 0.4 MG CAPS capsule Take 1 capsule (0.4 mg total) by mouth daily. 04/30/22   Zannie Cove, MD  torsemide (DEMADEX) 20 MG tablet Take 20 mg by mouth 2 (two) times daily.    [provider]    Physical Exam: Vitals:   06/08/22 1751  BP: (!) 91/45  Pulse: 60  Resp: (!) 22  Temp: 98.8 F (37.1 C)  TempSrc: Oral  SpO2: 94%     Constitutional: NAD, no pallor or diaphoresis  Eyes: PERTLA, lids and conjunctivae normal ENMT: Mucous membranes are moist. Posterior pharynx clear of any exudate or lesions.   Neck: supple, no masses  Respiratory: no wheezing, no crackles. No accessory muscle use.  Cardiovascular: S1 & S2 heard, regular rate and rhythm. Trace b/l lower extremity edema. No JVD. Abdomen: Soft, no tenderness. Bowel sounds active.  Musculoskeletal:  no clubbing / cyanosis. No joint deformity upper and lower extremities.   Skin: B/l lower leg hyperpigmentation in gaiter distribution. Warm, dry, well-perfused. Neurologic: CN 2-12 grossly intact. Moving all extremities. Alert and oriented.  Psychiatric: Calm. Cooperative.    Labs and Imaging on Admission: I have personally reviewed following labs and imaging studies  CBC: No results for input(s): "WBC", "NEUTROABS", "HGB", "HCT", "MCV", "PLT" in the last 168 hours. Basic Metabolic Panel: No results for input(s): "NA", "K", "CL", "CO2", "GLUCOSE", "BUN", "CREATININE", "CALCIUM", "MG", "PHOS" in the last 168 hours. GFR: Estimated Creatinine Clearance: 50.3 mL/min (A) (by C-G formula based on SCr of 1.32 mg/dL (H)). Liver Function Tests: No results for input(s): "AST", "ALT", "ALKPHOS", "BILITOT", "PROT", "ALBUMIN" in the last 168 hours. No results for input(s): "LIPASE",  "AMYLASE" in the last 168 hours. No results for input(s): "AMMONIA" in the last 168 hours. Coagulation Profile: No results for input(s): "INR", "PROTIME" in the last 168 hours. Cardiac Enzymes: No results for input(s): "CKTOTAL", "CKMB", "CKMBINDEX", "TROPONINI" in the last 168 hours. BNP (last 3 results) Recent Labs    12/22/21 1149 01/20/22 0000 01/28/22 1059  PROBNP 639* CANCELED 379   HbA1C: No results for input(s): "HGBA1C" in the last 72 hours. CBG: No results for input(s): "GLUCAP" in the last 168 hours. Lipid Profile: No results for input(s): "CHOL", "HDL", "LDLCALC", "TRIG", "CHOLHDL", "LDLDIRECT" in the last 72 hours. Thyroid Function Tests: No results for input(s): "TSH", "T4TOTAL", "FREET4", "T3FREE", "THYROIDAB" in the last 72 hours. Anemia Panel: No results for input(s): "VITAMINB12", "FOLATE", "FERRITIN", "TIBC", "IRON", "RETICCTPCT" in the last 72 hours. Urine analysis: No results found for: "COLORURINE", "APPEARANCEUR", "LABSPEC", "PHURINE", "GLUCOSEU", "HGBUR", "BILIRUBINUR", "KETONESUR", "PROTEINUR", "UROBILINOGEN", "NITRITE", "LEUKOCYTESUR" Sepsis Labs: @LABRCNTIP (procalcitonin:4,lacticidven:4) )No results found for this or any previous visit (from the past 240 hour(s)).   Radiological Exams on Admission: No results found.  EKG: Independently reviewed. Sinus rhythm, 1st degree AV block, RBBB, QTc 483.   Assessment/Plan   1. Sepsis secondary to UTI; 6 mm right UVJ stone  - Presented to [facility] ED on 5/20 with ~1wk dysuria and right flank that had become severe  - He was noted to be afebrile with marked leukocytosis, SBP 90s, initial lactate 4.4, and 6 mm right UVJ stone with moderate right hydroureteronephrosis on CT  - He was given 2 liter IVF bolus and empiric antibiotics at [facility] and lactate improved to 1.0  - Pt was transferred to [facility] for admission d/t no urologist available at [facility]  - Appreciate Dr. Marlou Porch of  urology reviewing case by phone, he plans to consult and asked for KUB and renal US, agreed with holding Eliquis  - Continue antibiotic coverage with Rocephin 2 g IV q24h, check KUB and renal US, hold Eliquis, repeat labs   2. AKI  - SCr was 1.70 in ED at [facility], up from 1.1 earlier this month  - Likely prerenal in setting of hypovolemia and low BP, unilateral hydrouteronephrosis also noted  - Renally-dose medications, continue IVF hydration, repeat chem panel    3. COPD; chronic hypoxic respiratory failure  - Not in exacerbation on admission  - Continue ICS-LAMA-LABA, supplemental O2, and as-needed DuoNebs    4. Hyponatremia  - Serum sodium was 123 at [facility] with urine sodium 14 in setting of hypovolemia  - Continue isotonic IVF hydration, hold diuretics, restrict free water, follow serial chem panels    5. Chronic HFpEF  - EF was 60-65% in April 2024  -  Appears hypovolemic  - Hold diuretics, hold beta-blocker (HR and BP low), continue cautious IVF hydration, monitor weight and I/Os    6. PAF  - SBP is 90s and HR 40s on arrival to [facility]  - Hold metoprolol, hold  Eliquis   7. Type 2 DM - Recent diagnosis with A1c 6.5% on May 9th  - Check CBGs, use low-intensity SSI if needed for now    8. Hypothyroidism  - TSH was 7.14 in ED at [facility] - Continue current dose Synthroid, check free T4    9. AAA - 3.2 cm infrarenal AAA noted on CT in ED at [facility]  - Outpatient follow-up recommended    DVT prophylaxis: SCDs  Code Status: DNR  Level of Care: Level of care: Progressive Family Communication: None present   Disposition Plan:  Patient is from: Home  Anticipated d/c is to: TBD Anticipated d/c date is: 06/11/22  Patient currently: Pending improved sodium and renal function, stable respiratory status, treatment of UTI and stone  Consults called: Urology   Admission status: Inpatient     [name], MD [facility]  06/08/2022, 8:12 PM

## 2022-06-08 NOTE — Progress Notes (Signed)
New patient admitted from [facility]. Patient very short of breath when with activity or rest. 02 increased to 4l after patient transferred from stretcher to chair. Patient states he cannot lay in the bed and be able to breathe well. MP shows SR/SB with pacs. Skin with bruising swelling and some scabbed areas on his left arm and legs. Both lower extremities discolored and dry.

## 2022-06-09 ENCOUNTER — Inpatient Hospital Stay (HOSPITAL_COMMUNITY): Payer: Medicare HMO

## 2022-06-09 DIAGNOSIS — I5032 Chronic diastolic (congestive) heart failure: Secondary | ICD-10-CM | POA: Diagnosis not present

## 2022-06-09 DIAGNOSIS — I480 Paroxysmal atrial fibrillation: Secondary | ICD-10-CM | POA: Diagnosis not present

## 2022-06-09 DIAGNOSIS — A419 Sepsis, unspecified organism: Secondary | ICD-10-CM | POA: Diagnosis not present

## 2022-06-09 DIAGNOSIS — J449 Chronic obstructive pulmonary disease, unspecified: Secondary | ICD-10-CM | POA: Diagnosis not present

## 2022-06-09 DIAGNOSIS — N210 Calculus in bladder: Secondary | ICD-10-CM | POA: Diagnosis not present

## 2022-06-09 LAB — URINALYSIS, ROUTINE W REFLEX MICROSCOPIC
Bacteria, UA: NONE SEEN
Bilirubin Urine: NEGATIVE
Glucose, UA: 50 mg/dL — AB
Ketones, ur: NEGATIVE mg/dL
Nitrite: NEGATIVE
Protein, ur: 30 mg/dL — AB
Specific Gravity, Urine: 1.011 (ref 1.005–1.030)
pH: 6 (ref 5.0–8.0)

## 2022-06-09 LAB — BASIC METABOLIC PANEL
Anion gap: 8 (ref 5–15)
BUN: 39 mg/dL — ABNORMAL HIGH (ref 8–23)
CO2: 20 mmol/L — ABNORMAL LOW (ref 22–32)
Calcium: 8.2 mg/dL — ABNORMAL LOW (ref 8.9–10.3)
Chloride: 97 mmol/L — ABNORMAL LOW (ref 98–111)
Creatinine, Ser: 2.08 mg/dL — ABNORMAL HIGH (ref 0.61–1.24)
GFR, Estimated: 32 mL/min — ABNORMAL LOW (ref >60)
Glucose, Bld: 86 mg/dL (ref 70–99)
Potassium: 4.6 mmol/L (ref 3.5–5.1)
Sodium: 125 mmol/L — ABNORMAL LOW (ref 135–145)

## 2022-06-09 LAB — CBC
HCT: 29.5 % — ABNORMAL LOW (ref 39.0–52.0)
Hemoglobin: 9.5 g/dL — ABNORMAL LOW (ref 13.0–17.0)
MCH: 28.1 pg (ref 26.0–34.0)
MCHC: 32.2 g/dL (ref 30.0–36.0)
MCV: 87.3 fL (ref 80.0–100.0)
Platelets: 243 10*3/uL (ref 150–400)
RBC: 3.38 MIL/uL — ABNORMAL LOW (ref 4.22–5.81)
RDW: 16.3 % — ABNORMAL HIGH (ref 11.5–15.5)
WBC: 16.5 10*3/uL — ABNORMAL HIGH (ref 4.0–10.5)
nRBC: 0 % (ref 0.0–0.2)

## 2022-06-09 LAB — GLUCOSE, CAPILLARY
Glucose-Capillary: 76 mg/dL (ref 70–99)
Glucose-Capillary: 109 mg/dL — ABNORMAL HIGH (ref 70–99)
Glucose-Capillary: 126 mg/dL — ABNORMAL HIGH (ref 70–99)
Glucose-Capillary: 126 mg/dL — ABNORMAL HIGH (ref 70–99)

## 2022-06-09 LAB — HEPARIN LEVEL (UNFRACTIONATED): Heparin Unfractionated: 0.69 IU/mL (ref 0.30–0.70)

## 2022-06-09 LAB — APTT: aPTT: 39 seconds — ABNORMAL HIGH (ref 24–36)

## 2022-06-09 LAB — T4, FREE: Free T4: 0.92 ng/dL (ref 0.61–1.12)

## 2022-06-09 MED ORDER — BUDESONIDE 0.25 MG/2ML IN SUSP
0.2500 mg | Freq: Two times a day (BID) | RESPIRATORY_TRACT | Status: DC
  Administered 2022-06-10 – 2022-06-13 (×7): 0.25 mg via RESPIRATORY_TRACT
  Filled 2022-06-09 (×7): qty 2

## 2022-06-09 MED ORDER — ARFORMOTEROL TARTRATE 15 MCG/2ML IN NEBU
15.0000 ug | INHALATION_SOLUTION | Freq: Two times a day (BID) | RESPIRATORY_TRACT | Status: DC
  Administered 2022-06-10 – 2022-06-13 (×7): 15 ug via RESPIRATORY_TRACT
  Filled 2022-06-09 (×7): qty 2

## 2022-06-09 MED ORDER — HEPARIN (PORCINE) 25000 UT/250ML-% IV SOLN
1450.0000 [IU]/h | INTRAVENOUS | Status: AC
  Administered 2022-06-09 – 2022-06-10 (×2): 1450 [IU]/h via INTRAVENOUS
  Filled 2022-06-09 (×2): qty 250

## 2022-06-09 MED ORDER — LIDOCAINE HCL 1 % IJ SOLN
15.0000 mL | Freq: Once | INTRAMUSCULAR | Status: AC
  Administered 2022-06-10: 15 mL
  Filled 2022-06-09: qty 15

## 2022-06-09 MED ORDER — REVEFENACIN 175 MCG/3ML IN SOLN
175.0000 ug | Freq: Every day | RESPIRATORY_TRACT | Status: DC
  Administered 2022-06-10 – 2022-06-13 (×4): 175 ug via RESPIRATORY_TRACT
  Filled 2022-06-09 (×4): qty 3

## 2022-06-09 MED ORDER — LACTATED RINGERS IV SOLN: INTRAVENOUS | Status: DC

## 2022-06-09 MED ORDER — SODIUM CHLORIDE 0.9 % IV SOLN: INTRAVENOUS | Status: DC

## 2022-06-09 MED ORDER — LIDOCAINE HCL 1 % IJ SOLN
15.0000 mL | Freq: Once | INTRAMUSCULAR | Status: DC
  Filled 2022-06-09: qty 15

## 2022-06-09 MED ORDER — IPRATROPIUM-ALBUTEROL 0.5-2.5 (3) MG/3ML IN SOLN
3.0000 mL | Freq: Three times a day (TID) | RESPIRATORY_TRACT | Status: DC
  Administered 2022-06-10: 3 mL via RESPIRATORY_TRACT
  Filled 2022-06-09: qty 3

## 2022-06-09 MED ORDER — ALBUMIN HUMAN 25 % IV SOLN
25.0000 g | Freq: Once | INTRAVENOUS | Status: AC
  Administered 2022-06-09: 25 g via INTRAVENOUS
  Filled 2022-06-09: qty 100

## 2022-06-09 MED ORDER — FUROSEMIDE 10 MG/ML IJ SOLN
40.0000 mg | Freq: Once | INTRAMUSCULAR | Status: AC
  Administered 2022-06-09: 40 mg via INTRAVENOUS
  Filled 2022-06-09: qty 4

## 2022-06-09 MED FILL — Albuterol Sulfate Soln Nebu 0.083% (2.5 MG/3ML): RESPIRATORY_TRACT | Qty: 3 | Status: AC

## 2022-06-09 MED FILL — Ipratropium Bromide Inhal Soln 0.02%: RESPIRATORY_TRACT | Qty: 2.5 | Status: AC

## 2022-06-09 NOTE — Progress Notes (Signed)
Patient became SOB while working with OT earlier this evening. Pt stated he felt more SOB than this morning. Patient was administered Neb tx, per request, and sats sustained 96-100% on 2LNC.   At this time, patient still stating he feels SOB and wants to try BiPAP again tonight if possible.

## 2022-06-09 NOTE — Progress Notes (Signed)
ANTICOAGULATION CONSULT NOTE  Pharmacy Consult for Heparin Indication: atrial fibrillation  No Known Allergies  Patient Measurements:   Heparin Dosing Weight: 90 kg  Vital Signs: Temp: 97.5 F (36.4 C) (05/22 0849) Temp Source: Oral (05/22 0849) BP: 115/74 (05/22 0810) Pulse Rate: 66 (05/22 0810)  Labs: Recent Labs    06/08/22 2012 06/09/22 0658  HGB 9.5* 9.5*  HCT 30.0* 29.5*  PLT 260 243  CREATININE 2.34* 2.08*    Estimated Creatinine Clearance: 31.9 mL/min (A) (by C-G formula based on SCr of 2.08 mg/dL (H)).   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   Alcohol use 09/12/2020   Anemia of chronic disease 04/25/2022   Arthritis    Atherosclerosis of native arteries of extremities with intermittent claudication, bilateral legs (HCC) 11/10/2021   Bilateral leg edema 02/16/2022   BPH (benign prostatic hyperplasia) 11/10/2021   CHF (congestive heart failure) (HCC)    Chronic hyponatremia 04/25/2022   Chronic respiratory failure with hypoxia (HCC) 05/09/2022   Cigarette smoker 10/21/2014   Class 1 obesity 04/25/2022   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)    Dyspnea    Dysrhythmia    atrial fibrillation   Encounter for prostate cancer screening 02/16/2022   Encounter for screening for lung cancer 02/16/2022   Essential hypertension 10/21/2014   Femur fracture, right (HCC) 09/12/2020   GERD (gastroesophageal reflux disease)    History of kidney stones    Hyperlipidemia 01/24/2017   Hypertension    Hyponatremia 04/26/2022   Kidney stones 01/24/2017   Paroxysmal atrial fibrillation (HCC) 10/21/2014    Pressure injury of skin 09/16/2020   SOB (shortness of breath) 05/10/2022   Tobacco abuse 02/16/2022   Assessment: 81 YOM with medical history significant for atrial fibrillation on apixaban PTA who is admitted for right-sided flank pain and dysuria found to have right UVJ stone. Last dose unknown, PTA (admitted 5/21 PM). Pharmacy consulted to initiate heparin while apixaban held for possible procedures. CBC stable, platelets 243.   Goal of Therapy:  Heparin level 0.3-0.7 units/ml aPTT 66-102 seconds Monitor platelets by anticoagulation protocol: Yes   Plan:  Start heparin infusion at 1450 units/hr Check heparin level in 8 hours and daily while on heparin Continue to monitor H&H and platelets   Thank you for allowing pharmacy to be a part of this patient's care.  Thelma Barge, PharmD Clinical Pharmacist

## 2022-06-09 NOTE — Progress Notes (Signed)
ANTICOAGULATION CONSULT NOTE  Pharmacy Consult for Heparin Indication: atrial fibrillation  No Known Allergies  Patient Measurements:   Heparin Dosing Weight: 90 kg  Vital Signs: Temp: 98 F (36.7 C) (05/22 1627) Temp Source: Oral (05/22 1627) BP: 93/56 (05/22 1630) Pulse Rate: 64 (05/22 1630)  Labs: Recent Labs    06/08/22 2012 06/09/22 0658 06/09/22 1815  HGB 9.5* 9.5*  --   HCT 30.0* 29.5*  --   PLT 260 243  --   APTT  --   --  39*  HEPARINUNFRC  --   --  0.69  CREATININE 2.34* 2.08*  --      Estimated Creatinine Clearance: 31.9 mL/min (A) (by C-G formula based on SCr of 2.08 mg/dL (H)).   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   Alcohol use 09/12/2020   Anemia of chronic disease 04/25/2022   Arthritis    Atherosclerosis of native arteries of extremities with intermittent claudication, bilateral legs (HCC) 11/10/2021   Bilateral leg edema 02/16/2022   BPH (benign prostatic hyperplasia) 11/10/2021   CHF (congestive heart failure) (HCC)    Chronic hyponatremia 04/25/2022   Chronic respiratory failure with hypoxia (HCC) 05/09/2022   Cigarette smoker 10/21/2014   Class 1 obesity 04/25/2022   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)    Dyspnea    Dysrhythmia    atrial fibrillation   Encounter for prostate cancer screening 02/16/2022   Encounter for screening for lung cancer 02/16/2022   Essential hypertension 10/21/2014   Femur fracture, right (HCC) 09/12/2020   GERD (gastroesophageal reflux disease)    History of kidney stones    Hyperlipidemia 01/24/2017   Hypertension    Hyponatremia 04/26/2022    Kidney stones 01/24/2017   Paroxysmal atrial fibrillation (HCC) 10/21/2014   Pressure injury of skin 09/16/2020   SOB (shortness of breath) 05/10/2022   Tobacco abuse 02/16/2022   Assessment: 81 YOM with medical history significant for atrial fibrillation on apixaban PTA who is admitted for right-sided flank pain and dysuria found to have right UVJ stone. Pharmacy consulted to initiate heparin while apixaban held for possible procedures. D/w patient and last dose of Eliquis was 5/19.   Heparin level therapeutic at 0.69, aPTT low at 39 seconds. Levels not correlating but since last dose of Eliquis was > 72h ago, heparin level likely accurate   Goal of Therapy:  Heparin level 0.3-0.7 units/ml aPTT 66-102 seconds Monitor platelets by anticoagulation protocol: Yes   Plan:  Continue heparin 1450 units/hr Confirmatory heparin level w/ AM labs  Rexford Maus, PharmD, BCPS 06/09/2022 7:39 PM

## 2022-06-09 NOTE — Consult Note (Addendum)
Urology Consult  Referring physician: Dr. Jerral Ralph Reason for referral: Ureteral Stone  Chief Complaint: ureteral stone, aki  History of Present Illness:  Kyle Farley is an 82 year old male presenting to [facility] with report of 1 week of right-sided flank pain and dysuria.  No hematuria, fever or chills were noted.  He initially presented to urgent care and was directed to [facility] emergency department.  He has had 2 episodes of vomiting attributable to pain.  He reports that he is not eaten in 4 days.  On assessment he was alert, oriented, and in no distress.  He was up in chair watching television and reported intermittent right-sided flank pain, trending more towards the groin. Urology was consulted to consider stone intervention.  Past Medical History:  Diagnosis Date   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 (HCC) 11/10/2021   Acquired hypothyroidism 11/10/2021   Acute hypoxic respiratory failure (HCC) 04/25/2022   Acute on chronic diastolic heart failure (HCC) 04/25/2022   Acute on chronic systolic heart failure (HCC) 11/10/2021   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/10/2021   Alcohol use 09/12/2020   Anemia of chronic disease 04/25/2022   Arthritis    Atherosclerosis of native arteries of extremities with intermittent claudication, bilateral legs (HCC) 11/10/2021   Bilateral leg edema 02/16/2022   BPH (benign prostatic hyperplasia) 11/10/2021   CHF (congestive heart failure) (HCC)    Chronic hyponatremia 04/25/2022   Chronic respiratory failure with hypoxia (HCC) 05/09/2022   Cigarette smoker 10/21/2014   Class 1 obesity 04/25/2022   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)    Dyspnea    Dysrhythmia    atrial fibrillation   Encounter for prostate cancer screening 02/16/2022   Encounter for screening for lung cancer 02/16/2022   Essential hypertension 10/21/2014   Femur fracture, right (HCC) 09/12/2020   GERD  (gastroesophageal reflux disease)    History of kidney stones    Hyperlipidemia 01/24/2017   Hypertension    Hyponatremia 04/26/2022   Kidney stones 01/24/2017   Paroxysmal atrial fibrillation (HCC) 10/21/2014   Pressure injury of skin 09/16/2020   SOB (shortness of breath) 05/10/2022   Tobacco abuse 02/16/2022   Past Surgical History:  Procedure Laterality Date   LEFT HEART CATH AND CORONARY ANGIOGRAPHY N/A 09/15/2020   Procedure: LEFT HEART CATH AND CORONARY ANGIOGRAPHY;  Surgeon: Tonny Bollman, MD;  Location: [facility] INVASIVE CV LAB;  Service: Cardiovascular;  Laterality: N/A;   LEG SURGERY Right    steal pin placed in right leg 35 years ago   ORIF FEMUR FRACTURE Right 09/15/2020   Procedure: OPEN REDUCTION INTERNAL FIXATION FEMORAL SHAFT FRACTURE;  Surgeon: Roby Lofts, MD;  Location: [facility] OR;  Service: Orthopedics;  Laterality: Right;    Medications: I have reviewed the patient's current medications. Allergies: No Known Allergies  Family History  Problem Relation Age of Onset   Cancer Mother    Diabetes Mother    Breast cancer Sister    Social History:  reports that he has been smoking cigarettes and cigars. He has a 62.00 pack-year smoking history. He has never used smokeless tobacco. He reports current alcohol use of about 5.0 standard drinks of alcohol per week. He reports that he does not use drugs.  ROS: All systems are reviewed and negative except as noted. --right side flank to groin pain.   Physical Exam:  Vital signs in last 24 hours: Temp:  [97.5 F (36.4 C)-98.8 F (37.1 C)] 97.5 F (36.4 C) (05/22  1610) Pulse Rate:  [50-66] 66 (05/22 0810) Resp:  [20-22] 20 (05/22 0401) BP: (91-119)/(45-74) 115/74 (05/22 0810) SpO2:  [90 %-100 %] 98 % (05/22 0401)  Cardiovascular: Skin warm; not flushed Respiratory: Breaths quiet; no shortness of breath Abdomen: No masses, soft, no guarding or rebound Neurological: Normal sensation to touch Musculoskeletal: Normal motor  function arms and legs Skin: No rashes Genitourinary: male purewick on, no urine available  Laboratory Data:  Results for orders placed or performed during the hospital encounter of 06/08/22 (from the past 72 hour(s))  Lactic acid, plasma     Status: None   Collection Time: 06/08/22  8:12 PM  Result Value Ref [place]   Lactic Acid, Venous 1.2 0.5 - 1.9 mmol/L    Comment: Performed at [facility], [address], [place], [place] [address]  Basic metabolic panel     Status: Abnormal   Collection Time: 06/08/22  8:12 PM  Result Value Ref [place]   Sodium 125 (L) 135 - 145 mmol/L   Potassium 5.1 3.5 - 5.1 mmol/L   Chloride 95 (L) 98 - 111 mmol/L   CO2 20 (L) 22 - 32 mmol/L   Glucose, Bld 88 70 - 99 mg/dL    Comment: Glucose reference range applies only to samples taken after fasting for at least 8 hours.   BUN 39 (H) 8 - 23 mg/dL   Creatinine, Ser 4.09 (H) 0.61 - 1.24 mg/dL   Calcium 8.5 (L) 8.9 - 10.3 mg/dL   GFR, Estimated 27 (L) >60 mL/min    Comment: (NOTE) Calculated using the CKD-EPI Creatinine Equation (2021)    Anion gap 10 5 - 15    Comment: Performed at [facility], [address], [place], [place] [address]  Magnesium     Status: None   Collection Time: 06/08/22  8:12 PM  Result Value Ref [place]   Magnesium 2.1 1.7 - 2.4 mg/dL    Comment: Performed at [facility], [address], [place], [place] [address]  CBC     Status: Abnormal   Collection Time: 06/08/22  8:12 PM  Result Value Ref [place]   WBC 21.5 (H) 4.0 - 10.5 K/uL   RBC 3.49 (L) 4.22 - 5.81 MIL/uL   Hemoglobin 9.5 (L) 13.0 - 17.0 g/dL   HCT 56.2 (L) 13.0 - 86.5 %   MCV 86.0 80.0 - 100.0 fL   MCH 27.2 26.0 - 34.0 pg   MCHC 31.7 30.0 - 36.0 g/dL   RDW 78.4 (H) 69.6 - 29.5 %   Platelets 260 150 - 400 K/uL   nRBC 0.0 0.0 - 0.2 %    Comment: Performed at [facility], [address] [place], [place] [address]  Glucose, capillary     Status: None   Collection Time: 06/08/22  9:22 PM   Result Value Ref [place]   Glucose-Capillary 87 70 - 99 mg/dL    Comment: Glucose reference range applies only to samples taken after fasting for at least 8 hours.  CBC     Status: Abnormal   Collection Time: 06/09/22  6:58 AM  Result Value Ref [place]   WBC 16.5 (H) 4.0 - 10.5 K/uL   RBC 3.38 (L) 4.22 - 5.81 MIL/uL   Hemoglobin 9.5 (L) 13.0 - 17.0 g/dL   HCT 24.4 (L) 01.0 - 27.2 %   MCV 87.3 80.0 - 100.0 fL   MCH 28.1 26.0 - 34.0 pg   MCHC 32.2 30.0 -  36.0 g/dL   RDW 16.1 (H) 09.6 - 04.5 %   Platelets 243 150 - 400 K/uL   nRBC 0.0 0.0 - 0.2 %    Comment: Performed at [facility], [address] [place], [place] [address]  T4, free     Status: None   Collection Time: 06/09/22  6:58 AM  Result Value Ref [place]   Free T4 0.92 0.61 - 1.12 ng/dL    Comment: (NOTE) Biotin ingestion may interfere with free T4 tests. If the results are inconsistent with the TSH level, previous test results, or the clinical presentation, then consider biotin interference. If needed, order repeat testing after stopping biotin. Performed at [facility], [address], [place], [place] [address]   Basic metabolic panel     Status: Abnormal   Collection Time: 06/09/22  6:58 AM  Result Value Ref [place]   Sodium 125 (L) 135 - 145 mmol/L   Potassium 4.6 3.5 - 5.1 mmol/L   Chloride 97 (L) 98 - 111 mmol/L   CO2 20 (L) 22 - 32 mmol/L   Glucose, Bld 86 70 - 99 mg/dL    Comment: Glucose reference range applies only to samples taken after fasting for at least 8 hours.   BUN 39 (H) 8 - 23 mg/dL   Creatinine, Ser 8.29 (H) 0.61 - 1.24 mg/dL   Calcium 8.2 (L) 8.9 - 10.3 mg/dL   GFR, Estimated 32 (L) >60 mL/min    Comment: (NOTE) Calculated using the CKD-EPI Creatinine Equation (2021)    Anion gap 8 5 - 15    Comment: Performed at [facility], [address], [place], [place] [address]  Glucose, capillary     Status: None   Collection Time: 06/09/22  8:48 AM  Result Value Ref [place]    Glucose-Capillary 76 70 - 99 mg/dL    Comment: Glucose reference range applies only to samples taken after fasting for at least 8 hours.   Comment 1 Notify RN    No results found for this or any previous visit (from the past 240 hour(s)). Creatinine: Recent Labs    06/08/22 2012 06/09/22 0658  CREATININE 2.34* 2.08*    Imaging: On independent review CT A/P from OSH shows 6 mm right side nonobstructing UVJ stone.  There is unfortunately artifact from the hip prosthesis at the level of the stone partially obscuring the view, but no hydronephrosis is noted.   Assessment/Plan:  # Ureteral stone # AKI  Moderate hydro with right UVJ stone. Patient reports not eating or drinking much for 4 days, likely that AKI is prerenal.  Is improving with gentle volume resuscitation. Scr 2.34-->2.08 UOP- 780cc  Laser lithotripsy vs ESWL. Will collect scout film to see if stone is visible on plain film. If not, only cysto with stent is available.  UA pending WBC 21.5-->16.5 Hold Eliquis for the next couple of days.  Will continue medical expulsive therapy during washout phase.  Will consider ESWL on Friday if patient has not improved.    Scherrie Bateman Aram Domzalski 06/09/2022, 8:57 AM  Pager: [phone]

## 2022-06-09 NOTE — Evaluation (Signed)
Occupational Therapy Evaluation Patient Details Name: Kyle Farley MRN: [id number] DOB: Apr 11, 1940 Today's Date: 06/09/2022   History of Present Illness Kyle Farley is an 82 year old male presenting to [facility] with 1 week of right-sided flank pain and dysuria. Pt transferred to [facility] on 5/21 for evaluation of sepsis secondary to complicated UTI/pyelonephritis in the setting of right UVJ stone. PMHx: PAF on Eliquis, chronic HFpEF, COPD-on 2 L of oxygen at home mostly nocturnally, EtOH use/tobacco.   Clinical Impression   Pt evaluated s/p above admission list. Pt reports occasional assistance for ADLs with use of LB AE and mod I for functional mobility with use of rollator at baseline. During session, pt limited secondary to fatigue, SOB with activity, decreased activity tolerance, generalized weakness and decreased balance. Pt currently requires setup A for seated UB ADLs and min A for LB ADLs. During session, pt required min guard A for functional transfers and lateral steps with use of RW. Pt tolerated standing for 1 minute before requiring seated rest break due to SOB with VSS. Session limited secondary to arrival of x-ray. Pt would continue to benefit from acute OT services to maximize functional independence and facilitate transition to skilled inpatient follow up therapy, <3 hours/day. Pt with potential to progress to home with HHOT with improved activity tolerance.       Recommendations for follow up therapy are one component of a multi-disciplinary discharge planning process, led by the attending physician.  Recommendations may be updated based on patient status, additional functional criteria and insurance authorization.   Assistance Recommended at Discharge Frequent or constant Supervision/Assistance  Patient can return home with the following A little help with walking and/or transfers;A little help with bathing/dressing/bathroom;Assistance with cooking/housework;Assist for  transportation;Help with stairs or ramp for entrance    Functional Status Assessment  Patient has had a recent decline in their functional status and demonstrates the ability to make significant improvements in function in a reasonable and predictable amount of time.  Equipment Recommendations  Other (comment) (defer)    Recommendations for Other Services       Precautions / Restrictions Precautions Precautions: Fall Precaution Comments: fall, CIWA precautions Restrictions Weight Bearing Restrictions: No      Mobility Bed Mobility Overal bed mobility: Needs Assistance Bed Mobility: Supine to Sit, Sit to Supine     Supine to sit: Min assist, HOB elevated Sit to supine: Min guard   General bed mobility comments: HOB elevated    Transfers Overall transfer level: Needs assistance Equipment used: Rolling walker (2 wheels) Transfers: Sit to/from Stand Sit to Stand: Min guard           General transfer comment: Pt completed STS transfers x2 trials from EOB with min guard A and use of RW. Pt tolerated standing for 1 minute before requiring seated rest break due to SOB. Pt took approx 3 side steps toward [facility] with min guard A and use of RW.      Balance Overall balance assessment: Needs assistance Sitting-balance support: Feet supported, No upper extremity supported Sitting balance-Leahy Scale: Fair Sitting balance - Comments: sitting EOB   Standing balance support: Reliant on assistive device for balance, Bilateral upper extremity supported Standing balance-Leahy Scale: Poor Standing balance comment: BUEs supported on RW, intermittent ability to offload single UE from RW.                           ADL either performed or assessed with clinical  judgement   ADL Overall ADL's : Needs assistance/impaired Eating/Feeding: Independent;Sitting   Grooming: Set up;Sitting   Upper Body Bathing: Supervision/ safety;Sitting   Lower Body Bathing: With adaptive  equipment;Sit to/from stand;Minimal assistance   Upper Body Dressing : Set up;Sitting   Lower Body Dressing: Minimal assistance;With adaptive equipment;Sit to/from stand   Toilet Transfer: Min guard;Rolling walker (2 wheels);BSC/3in1;Stand-pivot Statistician Details (indicate cue type and reason): simulated Toileting- Clothing Manipulation and Hygiene: Minimal assistance;Sit to/from stand       Functional mobility during ADLs: Min guard;Rolling walker (2 wheels) General ADL Comments: Pt uses AE for LB dressing at baseline. Pt requires unilateral UE support on RW for stability in standing.     Vision Baseline Vision/History: 1 Wears glasses Ability to See in Adequate Light: 0 Adequate Vision Assessment?: No apparent visual deficits     Perception Perception Perception Tested?: No   Praxis Praxis Praxis tested?: Not tested    Pertinent Vitals/Pain Pain Assessment Pain Assessment: Faces Faces Pain Scale: Hurts a little bit Pain Location: R flank Pain Descriptors / Indicators: Discomfort, Grimacing Pain Intervention(s): Limited activity within patient's tolerance     Hand Dominance Right   Extremity/Trunk Assessment Upper Extremity Assessment Upper Extremity Assessment: Overall WFL for tasks assessed   Lower Extremity Assessment Lower Extremity Assessment: Defer to PT evaluation   Cervical / Trunk Assessment Cervical / Trunk Assessment: Kyphotic   Communication Communication Communication: No difficulties   Cognition Arousal/Alertness: Awake/alert Behavior During Therapy: WFL for tasks assessed/performed Overall Cognitive Status: Within Functional Limits for tasks assessed                                 General Comments: Pt A+O x4     General Comments  VSS on 5L [place]. Session limited secondary to arrival of x-ray.            Home Living Family/patient expects to be discharged to:: Private residence Living Arrangements: Children  (son) Available Help at Discharge: Available PRN/intermittently Type of Home: Mobile home Home Access: Stairs to enter Entrance Stairs-Number of Steps: 4 Entrance Stairs-Rails: Right;Left Home Layout: One level     Bathroom Shower/Tub: Producer, television/film/video: Standard Bathroom Accessibility: Yes How Accessible: Accessible via walker Home Equipment: Cane - single point;Educational psychologist (4 wheels)   Additional Comments: Relatives live close by      Prior Functioning/Environment Prior Level of Function : Driving;Needs assist             Mobility Comments: mod I with use of rollator ADLs Comments: Pt reports needing occasional assistance from son for ADLs. Uses AE for LB dressing at baseline. Pt sleeps in recliner.        OT Problem List: Decreased strength;Decreased activity tolerance;Impaired balance (sitting and/or standing);Cardiopulmonary status limiting activity;Pain      OT Treatment/Interventions: Self-care/ADL training;Energy conservation;DME and/or AE instruction;Therapeutic activities;Patient/family education;Balance training    OT Goals(Current goals can be found in the care plan section) Acute Rehab OT Goals Patient Stated Goal: to get back in bed OT Goal Formulation: With patient Time For Goal Achievement: 06/23/22 Potential to Achieve Goals: Good ADL Goals Pt Will Perform Lower Body Dressing: with modified independence;sit to/from stand;with adaptive equipment Pt Will Transfer to Toilet: with modified independence;ambulating;bedside commode Pt Will Perform Toileting - Clothing Manipulation and hygiene: with modified independence;sit to/from stand Additional ADL Goal #1: Pt will tolerate standing ADLs for 8 minutes without need for  seated rest break  OT Frequency: Min 2X/week    AM-PAC OT "6 Clicks" Daily Activity     Outcome Measure Help from another person eating meals?: None Help from another person taking care of personal grooming?: A  Little Help from another person toileting, which includes using toliet, bedpan, or urinal?: A Little Help from another person bathing (including washing, rinsing, drying)?: A Little Help from another person to put on and taking off regular upper body clothing?: A Little Help from another person to put on and taking off regular lower body clothing?: A Little 6 Click Score: 19   End of Session Equipment Utilized During Treatment: Gait belt;Rolling walker (2 wheels);Oxygen Nurse Communication: Mobility status  Activity Tolerance: Patient limited by fatigue Patient left: in bed;with call bell/phone within reach  OT Visit Diagnosis: Unsteadiness on feet (R26.81);Other abnormalities of gait and mobility (R26.89);Muscle weakness (generalized) (M62.81);Pain                Time: 1324-4010 OT Time Calculation (min): 13 min Charges:  OT General Charges $OT Visit: 1 Visit OT Evaluation $OT Eval Moderate Complexity: 1 Mod  Sherley Bounds, OTS Acute Rehabilitation Services Office [phone] Secure Chat Communication Preferred   Sherley Bounds 06/09/2022, 4:47 PM

## 2022-06-09 NOTE — Plan of Care (Signed)
  Problem: Education: Goal: Knowledge of General Education information will improve Description: Including pain rating scale, medication(s)/side effects and non-pharmacologic comfort measures Outcome: Progressing   Problem: Health Behavior/Discharge Planning: Goal: Ability to manage health-related needs will improve Outcome: Progressing   Problem: Clinical Measurements: Goal: Ability to maintain clinical measurements within normal limits will improve Outcome: Progressing Goal: Will remain free from infection Outcome: Progressing Goal: Diagnostic test results will improve Outcome: Progressing Goal: Respiratory complications will improve Outcome: Progressing Goal: Cardiovascular complication will be avoided Outcome: Progressing   Problem: Activity: Goal: Risk for activity intolerance will decrease Outcome: Progressing   Problem: Nutrition: Goal: Adequate nutrition will be maintained Outcome: Progressing   Problem: Coping: Goal: Level of anxiety will decrease Outcome: Progressing   Problem: Elimination: Goal: Will not experience complications related to bowel motility Outcome: Progressing Goal: Will not experience complications related to urinary retention Outcome: Progressing   Problem: Pain Managment: Goal: General experience of comfort will improve Outcome: Progressing   Problem: Safety: Goal: Ability to remain free from injury will improve Outcome: Progressing   Problem: Skin Integrity: Goal: Risk for impaired skin integrity will decrease Outcome: Progressing   Problem: Education: Goal: Ability to describe self-care measures that may prevent or decrease complications (Diabetes Survival Skills Education) will improve Outcome: Progressing Goal: Individualized Educational Video(s) Outcome: Progressing   Problem: Coping: Goal: Ability to adjust to condition or change in health will improve Outcome: Progressing   Problem: Fluid Volume: Goal: Ability to  maintain a balanced intake and output will improve Outcome: Progressing   Problem: Health Behavior/Discharge Planning: Goal: Ability to identify and utilize available resources and services will improve Outcome: Progressing Goal: Ability to manage health-related needs will improve Outcome: Progressing   Problem: Metabolic: Goal: Ability to maintain appropriate glucose levels will improve Outcome: Progressing   Problem: Nutritional: Goal: Maintenance of adequate nutrition will improve Outcome: Progressing Goal: Progress toward achieving an optimal weight will improve Outcome: Progressing   Problem: Skin Integrity: Goal: Risk for impaired skin integrity will decrease Outcome: Progressing   Problem: Tissue Perfusion: Goal: Adequacy of tissue perfusion will improve Outcome: Progressing   Problem: Fluid Volume: Goal: Hemodynamic stability will improve Outcome: Progressing   Problem: Clinical Measurements: Goal: Diagnostic test results will improve Outcome: Progressing Goal: Signs and symptoms of infection will decrease Outcome: Progressing   Problem: Respiratory: Goal: Ability to maintain adequate ventilation will improve Outcome: Progressing   

## 2022-06-09 NOTE — Progress Notes (Addendum)
PROGRESS NOTE        PATIENT DETAILS Name: VEDANSH MURATALLA Age: 82 y.o. Sex: male Date of Birth: 1940/09/05 Admit Date: 06/08/2022 Admitting Physician [name], MD PCP:[name], [name], MD  Brief Summary: Patient is a 82 y.o.  male history of PAF on Eliquis, chronic HFpEF, COPD-on 2 L of oxygen at home mostly nocturnally, EtOH use/tobacco use-who was transferred from [facility] for evaluation of sepsis secondary to complicated UTI/pyelonephritis in the setting of right UVJ stone.  Significant events: 5/21>> transferred to [facility] from [place] [facility]  Significant studies: 5/20>> CT abdomen/pelvis: Moderate right hydronephrosis/hydroureter-6 mm right ureterovesical junction calculus. 5/21>> renal ultrasound: No hydronephrosis.  Significant microbiology data:   Procedures:   Consults: None  Subjective: Lying comfortably in bed-denies any chest pain or shortness of breath.  Objective: Vitals: Blood pressure 115/74, pulse 66, temperature (!) 97.5 F (36.4 C), temperature source Oral, resp. rate 20, SpO2 98 %.   Exam: Gen Exam:Alert awake-not in any distress HEENT:atraumatic, normocephalic Chest: B/L clear to auscultation anteriorly CVS:S1S2 regular Abdomen:soft non tender, non distended Extremities:no edema Neurology: Non focal Skin: no rash  Pertinent Labs/Radiology:    Latest Ref Rng & Units 06/09/2022    6:58 AM 06/08/2022    8:12 PM 05/27/2022    4:43 PM  CBC  WBC 4.0 - 10.5 K/uL 16.5  21.5  9.9   Hemoglobin 13.0 - 17.0 g/dL 9.5  9.5  16.1   Hematocrit 39.0 - 52.0 % 29.5  30.0  34.5   Platelets 150 - 400 K/uL 243  260  284     Lab Results  Component Value Date   NA 125 (L) 06/09/2022   K 4.6 06/09/2022   CL 97 (L) 06/09/2022   CO2 20 (L) 06/09/2022      Assessment/Plan: Sepsis due to complicated UTI in the setting of 6 mm right UVJ stone Sepsis physiology improved-feels better this morning-continue empiric antibiotics-await  cultures Urology following-await further recommendations  AKI Likely hemodynamically mediated Improving with supportive care  Hyponatremia Mild and asymptomatic Continue IV fluid Repeat electrolytes tomorrow  Chronic HFpEF Euvolemic Diuretics/beta-blocker on hold-as BP initially soft when he first presented-resume once more clinically improved  PAF Sinus rhythm Beta-blocker on hold due to soft BP Eliquis currently on hold-as patient likely will require urological procedures. Will start IV heparin in the interim.  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  EtOH use No withdrawal symptoms currently Ativan per CIWA protocol  Obesity: Estimated body mass index is 30.33 kg/m as calculated from the following:   Height as of 06/03/22: 5\' 9"  (1.753 m).   Weight as of 06/03/22: 93.2 kg.   Code status:   Code Status: DNR   DVT Prophylaxis: SCDs Start: 06/08/22 1955   Family Communication: None at bedside   Disposition Plan: Status is: Inpatient Remains inpatient appropriate because: Severity of illness   Planned Discharge Destination:Home   Diet: Diet Order             Diet NPO time specified Except for: Ice Chips, Sips with Meds  Diet effective midnight  Antimicrobial agents: Anti-infectives (From admission, onward)    Start     Dose/Rate Route Frequency Ordered Stop   06/08/22 2100  cefTRIAXone (ROCEPHIN) 2 g in sodium chloride 0.9 % 100 mL IVPB        2 g 200 mL/hr over 30 Minutes Intravenous Every 24 hours 06/08/22 1959 06/15/22 2059        MEDICATIONS: Scheduled Meds:  atorvastatin  40 mg Oral Daily   fluticasone furoate-vilanterol  1 puff  Inhalation Daily   And   umeclidinium bromide  1 puff Inhalation Daily   folic acid  1 mg Oral Daily   insulin aspart  0-5 Units Subcutaneous QHS   insulin aspart  0-6 Units Subcutaneous TID WC   levothyroxine  50 mcg Oral Q0600   LORazepam  0-4 mg Intravenous Q4H   Followed by   Melene Muller ON 06/10/2022] LORazepam  0-4 mg Intravenous Q8H   multivitamin with minerals  1 tablet Oral Daily   pantoprazole  40 mg Oral Daily   sodium chloride flush  3 mL Intravenous Q12H   tamsulosin  0.4 mg Oral Daily   thiamine  100 mg Oral Daily   Or   thiamine  100 mg Intravenous Daily   Continuous Infusions:  cefTRIAXone (ROCEPHIN)  IV 2 g (06/08/22 2102)   PRN Meds:.acetaminophen **OR** acetaminophen, ipratropium-albuterol, LORazepam **OR** LORazepam, melatonin, oxyCODONE, prochlorperazine   I have personally reviewed following labs and imaging studies  LABORATORY DATA: CBC: Recent Labs  Lab 06/08/22 2012 06/09/22 0658  WBC 21.5* 16.5*  HGB 9.5* 9.5*  HCT 30.0* 29.5*  MCV 86.0 87.3  PLT 260 243    Basic Metabolic Panel: Recent Labs  Lab 06/08/22 2012 06/09/22 0658  NA 125* 125*  K 5.1 4.6  CL 95* 97*  CO2 20* 20*  GLUCOSE 88 86  BUN 39* 39*  CREATININE 2.34* 2.08*  CALCIUM 8.5* 8.2*  MG 2.1  --     GFR: Estimated Creatinine Clearance: 31.9 mL/min (A) (by C-G formula based on SCr of 2.08 mg/dL (H)).  Liver Function Tests: No results for input(s): "AST", "ALT", "ALKPHOS", "BILITOT", "PROT", "ALBUMIN" in the last 168 hours. No results for input(s): "LIPASE", "AMYLASE" in the last 168 hours. No results for input(s): "AMMONIA" in the last 168 hours.  Coagulation Profile: No results for input(s): "INR", "PROTIME" in the last 168 hours.  Cardiac Enzymes: No results for input(s): "CKTOTAL", "CKMB", "CKMBINDEX", "TROPONINI" in the last 168 hours.  BNP (last 3 results) Recent Labs    12/22/21 1149 01/20/22 0000 01/28/22 1059  PROBNP 639* CANCELED 379    Lipid  Profile: No results for input(s): "CHOL", "HDL", "LDLCALC", "TRIG", "CHOLHDL", "LDLDIRECT" in the last 72 hours.  Thyroid Function Tests: Recent Labs    06/09/22 0658  FREET4 0.92    Anemia Panel: No results for input(s): "VITAMINB12", "FOLATE", "FERRITIN", "TIBC", "IRON", "RETICCTPCT" in the last 72 hours.  Urine analysis:    Component Value Date/Time   COLORURINE YELLOW 06/09/2022 0901   APPEARANCEUR CLEAR 06/09/2022 0901   LABSPEC 1.011 06/09/2022 0901   PHURINE 6.0 06/09/2022 0901   GLUCOSEU 50 (A) 06/09/2022 0901   HGBUR LARGE (A) 06/09/2022 0901   BILIRUBINUR NEGATIVE 06/09/2022 0901   KETONESUR NEGATIVE 06/09/2022 0901   PROTEINUR 30 (A) 06/09/2022 0901   NITRITE NEGATIVE 06/09/2022 0901   LEUKOCYTESUR MODERATE (A) 06/09/2022 0901    Sepsis Labs: Lactic Acid, Venous    Component Value Date/Time   LATICACIDVEN 1.2 06/08/2022 2012  MICROBIOLOGY: No results found for this or any previous visit (from the past 240 hour(s)).  RADIOLOGY STUDIES/RESULTS: US RENAL  Result Date: 06/08/2022 CLINICAL DATA:  Sepsis secondary to UTI. EXAM: RENAL / URINARY TRACT ULTRASOUND COMPLETE COMPARISON:  CT chest abdomen and pelvis 06/07/2022 FINDINGS: Right Kidney: Renal measurements: 10.3 x 5.8 x 6.1 cm = volume: 227 mL. Echogenicity within normal limits. No mass or hydronephrosis visualized. Left Kidney: Renal measurements: 10.5 x 5.7 x 4.2 cm = volume: 130 mL. Echogenicity within normal limits. No mass or hydronephrosis visualized. Bladder: Within the inferior aspect of the bladder there is nonvascular hyperdensity measuring 3.1 x 1.2 by 2.0 cm. Other: None. IMPRESSION: 1. No hydronephrosis. 2. Nonvascular hyperdensity within the inferior aspect of the bladder measuring 3.1 x 1.2 x 2.0 cm. This may represent debris or blood products. Electronically Signed   By: Darliss Cheney M.D.   On: 06/08/2022 23:25   DG Abd Portable 1V  Result Date: 06/08/2022 CLINICAL DATA:  Sepsis secondary to  UTI. Obstruction of right ureteropelvic junction due to stone EXAM: PORTABLE ABDOMEN - 1 VIEW COMPARISON:  CT 06/07/2022 FINDINGS: Best possible image given patient positioning and body habitus. Single frontal view of the upper abdomen demonstrates multiple loops of gas-filled large and small. No radiopaque calculi. The UVJs were unable to be included in the image at the patient is sitting upright in a chair. Thoracolumbar spondylosis. IMPRESSION: Best possible image given patient positioning and body habitus. The UVJs were unable to be included in the image at the patient is sitting upright. Gaseous distention of the small and large bowel. Electronically Signed   By: Minerva Fester M.D.   On: 06/08/2022 21:13     LOS: 1 day   Jeoffrey Massed, MD  [facility]    To contact the attending provider between 7A-7P or the covering provider during after hours 7P-7A, please log into the web site [email or web address] and access using universal [place] password for that web site. If you do not have the password, please call the hospital operator.  06/09/2022, 9:18 AM

## 2022-06-10 DIAGNOSIS — J449 Chronic obstructive pulmonary disease, unspecified: Secondary | ICD-10-CM | POA: Diagnosis not present

## 2022-06-10 DIAGNOSIS — I5032 Chronic diastolic (congestive) heart failure: Secondary | ICD-10-CM | POA: Diagnosis not present

## 2022-06-10 DIAGNOSIS — A419 Sepsis, unspecified organism: Secondary | ICD-10-CM | POA: Diagnosis not present

## 2022-06-10 DIAGNOSIS — I480 Paroxysmal atrial fibrillation: Secondary | ICD-10-CM | POA: Diagnosis not present

## 2022-06-10 LAB — COMPREHENSIVE METABOLIC PANEL
ALT: 36 U/L (ref 0–44)
AST: 50 U/L — ABNORMAL HIGH (ref 15–41)
Albumin: 2.5 g/dL — ABNORMAL LOW (ref 3.5–5.0)
Alkaline Phosphatase: 64 U/L (ref 38–126)
Anion gap: 11 (ref 5–15)
BUN: 36 mg/dL — ABNORMAL HIGH (ref 8–23)
CO2: 22 mmol/L (ref 22–32)
Calcium: 8.4 mg/dL — ABNORMAL LOW (ref 8.9–10.3)
Chloride: 94 mmol/L — ABNORMAL LOW (ref 98–111)
Creatinine, Ser: 1.79 mg/dL — ABNORMAL HIGH (ref 0.61–1.24)
GFR, Estimated: 38 mL/min — ABNORMAL LOW (ref >60)
Glucose, Bld: 122 mg/dL — ABNORMAL HIGH (ref 70–99)
Potassium: 4.2 mmol/L (ref 3.5–5.1)
Sodium: 127 mmol/L — ABNORMAL LOW (ref 135–145)
Total Bilirubin: 0.6 mg/dL (ref 0.3–1.2)
Total Protein: 6.3 g/dL — ABNORMAL LOW (ref 6.5–8.1)

## 2022-06-10 LAB — CBC
HCT: 29.9 % — ABNORMAL LOW (ref 39.0–52.0)
Hemoglobin: 9.4 g/dL — ABNORMAL LOW (ref 13.0–17.0)
MCH: 27.3 pg (ref 26.0–34.0)
MCHC: 31.4 g/dL (ref 30.0–36.0)
MCV: 86.9 fL (ref 80.0–100.0)
Platelets: 244 10*3/uL (ref 150–400)
RBC: 3.44 MIL/uL — ABNORMAL LOW (ref 4.22–5.81)
RDW: 16.1 % — ABNORMAL HIGH (ref 11.5–15.5)
WBC: 10.9 10*3/uL — ABNORMAL HIGH (ref 4.0–10.5)
nRBC: 0 % (ref 0.0–0.2)

## 2022-06-10 LAB — GLUCOSE, CAPILLARY
Glucose-Capillary: 115 mg/dL — ABNORMAL HIGH (ref 70–99)
Glucose-Capillary: 168 mg/dL — ABNORMAL HIGH (ref 70–99)
Glucose-Capillary: 102 mg/dL — ABNORMAL HIGH (ref 70–99)
Glucose-Capillary: 137 mg/dL — ABNORMAL HIGH (ref 70–99)

## 2022-06-10 LAB — MAGNESIUM: Magnesium: 2 mg/dL (ref 1.7–2.4)

## 2022-06-10 LAB — HEPARIN LEVEL (UNFRACTIONATED): Heparin Unfractionated: 0.47 IU/mL (ref 0.30–0.70)

## 2022-06-10 LAB — PROCALCITONIN: Procalcitonin: 1.82 ng/mL

## 2022-06-10 LAB — BRAIN NATRIURETIC PEPTIDE: B Natriuretic Peptide: 839.5 pg/mL — ABNORMAL HIGH (ref 0.0–100.0)

## 2022-06-10 MED ORDER — TETANUS-DIPHTH-ACELL PERTUSSIS 5-2.5-18.5 LF-MCG/0.5 IM SUSY
0.5000 mL | PREFILLED_SYRINGE | Freq: Once | INTRAMUSCULAR | Status: AC
  Administered 2022-06-10: 0.5 mL via INTRAMUSCULAR
  Filled 2022-06-10: qty 0.5

## 2022-06-10 MED ORDER — APIXABAN 2.5 MG PO TABS
2.5000 mg | ORAL_TABLET | Freq: Two times a day (BID) | ORAL | Status: DC
  Administered 2022-06-10 – 2022-06-13 (×7): 2.5 mg via ORAL
  Filled 2022-06-10 (×7): qty 1

## 2022-06-10 MED ORDER — TORSEMIDE 20 MG PO TABS
20.0000 mg | ORAL_TABLET | Freq: Two times a day (BID) | ORAL | Status: DC
  Administered 2022-06-10 – 2022-06-11 (×4): 20 mg via ORAL
  Filled 2022-06-10 (×4): qty 1

## 2022-06-10 MED ORDER — TORSEMIDE 20 MG PO TABS: 20.0000 mg | ORAL_TABLET | Freq: Two times a day (BID) | ORAL | Status: DC

## 2022-06-10 NOTE — Progress Notes (Addendum)
ANTICOAGULATION CONSULT NOTE  Pharmacy Consult for Heparin Indication: atrial fibrillation  No Known Allergies  Patient Measurements: Weight: 99.7 kg (219 lb 11.2 oz) Heparin Dosing Weight: 90 kg  Vital Signs: Temp: 99.1 F (37.3 C) (05/23 0300) Temp Source: Oral (05/23 0750) BP: 120/58 (05/23 0810) Pulse Rate: 102 (05/23 0810)  Labs: Recent Labs    06/08/22 2012 06/09/22 0658 06/09/22 1815 06/10/22 0352  HGB 9.5* 9.5*  --  9.4*  HCT 30.0* 29.5*  --  29.9*  PLT 260 243  --  244  APTT  --   --  39*  --   HEPARINUNFRC  --   --  0.69 0.47  CREATININE 2.34* 2.08*  --  1.79*    Estimated Creatinine Clearance: 38.3 mL/min (A) (by C-G formula based on SCr of 1.79 mg/dL (H)).   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   Alcohol use 09/12/2020   Anemia of chronic disease 04/25/2022   Arthritis    Atherosclerosis of native arteries of extremities with intermittent claudication, bilateral legs (HCC) 11/10/2021   Bilateral leg edema 02/16/2022   BPH (benign prostatic hyperplasia) 11/10/2021   CHF (congestive heart failure) (HCC)    Chronic hyponatremia 04/25/2022   Chronic respiratory failure with hypoxia (HCC) 05/09/2022   Cigarette smoker 10/21/2014   Class 1 obesity 04/25/2022   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)    Dyspnea    Dysrhythmia    atrial fibrillation   Encounter for prostate cancer screening 02/16/2022   Encounter for screening for lung cancer 02/16/2022   Essential hypertension 10/21/2014   Femur fracture, right (HCC) 09/12/2020   GERD (gastroesophageal reflux disease)    History of kidney  stones    Hyperlipidemia 01/24/2017   Hypertension    Hyponatremia 04/26/2022   Kidney stones 01/24/2017   Paroxysmal atrial fibrillation (HCC) 10/21/2014   Pressure injury of skin 09/16/2020   SOB (shortness of breath) 05/10/2022   Tobacco abuse 02/16/2022   Assessment: 81 YOM with medical history significant for atrial fibrillation on apixaban PTA who is admitted for right-sided flank pain and dysuria found to have right UVJ stone. Pharmacy consulted to initiate heparin while apixaban held for possible procedures. D/w patient and last dose of Eliquis was 5/19.   Levels previously not correlating but since last dose of Eliquis was > 72h ago, heparin level likely accurate. Heparin level therapeutic at 0.47. Hgb low stable 9.4, platelets 244.  Goal of Therapy:  Heparin level 0.3-0.7 units/ml aPTT 66-102 seconds Monitor platelets by anticoagulation protocol: Yes   Plan:  Stop heparin infusion Give apixaban 2.5mg  twice daily (adjusted for current renal function) Continue to monitor H&H and platelets  Thank you for allowing pharmacy to be a part of this patient's care.  Thelma Barge, PharmD Clinical Pharmacist

## 2022-06-10 NOTE — Progress Notes (Signed)
  X-cover Note: Called by RN earlier this evening. Pt fell out of bed. Struck head. CT head negative. Sustained laceration to right eyebrow. Called to suture laceration.  Carollee Herter, DO [facility]

## 2022-06-10 NOTE — Progress Notes (Signed)
PROGRESS NOTE        PATIENT DETAILS Name: Kyle Farley Age: 82 y.o. Sex: male Date of Birth: 09-Jan-1941 Admit Date: 06/08/2022 Admitting Physician [name], MD PCP:[name], [name], MD  Brief Summary: Patient is a 82 y.o.  male history of PAF on Eliquis, chronic HFpEF, COPD-on 2 L of oxygen at home mostly nocturnally, EtOH use/tobacco use-who was transferred from [facility] for evaluation of sepsis secondary to complicated UTI/pyelonephritis in the setting of right UVJ stone.  Significant events: 5/21>> transferred to [facility] from [place] [facility] 5/22>> exertional dyspnea-x-ray with interstitial edema-IV Lasix.  IVF to [facility] 5/23>> mechanical fall-right eyebrow laceration-sutured.  Significant studies: 5/20>> CT abdomen/pelvis: Moderate right hydronephrosis/hydroureter-6 mm right ureterovesical junction calculus. 5/21>> renal ultrasound: No hydronephrosis. 5/22>> x-ray abdomen: 7 mm rounded calcification overlying the sacrococcygeal junction-Per urology-prior calculi now in the bladder.  Significant microbiology data: 5/19>> blood culture (done at [facility]): E. coli (spoke with microbiology lab at [facility] 5/23)  Procedures: None  Consults: Urology  Subjective: Awake/alert-feels better.  No further flank pain.  Breathing is better.  Objective: Vitals: Blood pressure (!) 120/58, pulse (!) 102, temperature 99.1 F (37.3 C), temperature source Oral, resp. rate (!) 21, weight 99.7 kg, SpO2 96 %.   Exam: Gen Exam:Alert awake-not in any distress HEENT:atraumatic, normocephalic Chest: B/L clear to auscultation anteriorly CVS:S1S2 regular Abdomen:soft non tender, non distended Extremities:trace edema Neurology: Non focal Skin: no rash  Pertinent Labs/Radiology:    Latest Ref Rng & Units 06/10/2022    3:52 AM 06/09/2022    6:58 AM 06/08/2022    8:12 PM  CBC  WBC 4.0 - 10.5 K/uL 10.9  16.5  21.5   Hemoglobin 13.0 - 17.0 g/dL 9.4  9.5  9.5    Hematocrit 39.0 - 52.0 % 29.9  29.5  30.0   Platelets 150 - 400 K/uL 244  243  260     Lab Results  Component Value Date   NA 127 (L) 06/10/2022   K 4.2 06/10/2022   CL 94 (L) 06/10/2022   CO2 22 06/10/2022      Assessment/Plan: Sepsis due to complicated UTI and E. coli bacteremia in the setting of 6 mm right UVJ stone Sepsis physiology has resolved Per urology-stone in right UVJ has passed into the bladder Continue IV Rocephin Await further culture data-sensitivities Urology not planning on any cystoscopy/stent placement.  AKI Likely hemodynamically mediated Continues to slowly improved with supportive care.  Hyponatremia Mild and asymptomatic Improving with supportive care Follow electrolytes.  Acute on chronic chronic HFpEF Developed exertional dyspnea-CXR on 5/22 showed pulm edema IV Lasix given 5/22-will restart oral diuretics Follow closely.  PAF Sinus rhythm Continue to hold beta-blocker-allow BP to increase further Since neurology not planning on any procedures-transition from IV heparin to Eliquis.    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  EtOH use No withdrawal symptoms currently Ativan per CIWA protocol  Obesity: Estimated body mass index is 32.44 kg/m as calculated from the following:  Height as of 06/03/22: 5\' 9"  (1.753 m).   Weight as of this encounter: 99.7 kg.   Code status:   Code Status: DNR   DVT Prophylaxis: apixaban (ELIQUIS) tablet 2.5 mg Start: 06/10/22 1215 SCDs Start: 06/08/22 1955 apixaban (ELIQUIS) tablet 2.5 mg    Family  Communication: Son-[name]-[phone] updated over the phone on 5/23   Disposition Plan: Status is: Inpatient Remains inpatient appropriate because: Severity of illness   Planned Discharge Destination:Home   Diet: Diet Order             Diet heart healthy/carb modified Room service appropriate? Yes; Fluid consistency: Thin  Diet effective now                     Antimicrobial agents: Anti-infectives (From admission, onward)    Start     Dose/Rate Route Frequency Ordered Stop   06/08/22 2100  cefTRIAXone (ROCEPHIN) 2 g in sodium chloride 0.9 % 100 mL IVPB        2 g 200 mL/hr over 30 Minutes Intravenous Every 24 hours 06/08/22 1959 06/15/22 2059        MEDICATIONS: Scheduled Meds:  apixaban  2.5 mg Oral BID   arformoterol  15 mcg Nebulization BID   atorvastatin  40 mg Oral Daily   budesonide (PULMICORT) nebulizer solution  0.25 mg Nebulization BID   folic acid  1 mg Oral Daily   insulin aspart  0-5 Units Subcutaneous QHS   insulin aspart  0-6 Units Subcutaneous TID WC   levothyroxine  50 mcg Oral Q0600   LORazepam  0-4 mg Intravenous Q4H   Followed by   LORazepam  0-4 mg Intravenous Q8H   multivitamin with minerals  1 tablet Oral Daily   pantoprazole  40 mg Oral Daily   revefenacin  175 mcg Nebulization Daily   sodium chloride flush  3 mL Intravenous Q12H   tamsulosin  0.4 mg Oral Daily   Tdap  0.5 mL Intramuscular Once   thiamine  100 mg Oral Daily   Or   thiamine  100 mg Intravenous Daily   Continuous Infusions:  cefTRIAXone (ROCEPHIN)  IV Stopped (06/09/22 2109)   heparin 1,450 Units/hr (06/10/22 0708)   PRN Meds:.acetaminophen **OR** acetaminophen, ipratropium-albuterol, LORazepam **OR** LORazepam, melatonin, oxyCODONE, prochlorperazine   I have personally reviewed following labs and imaging studies  LABORATORY DATA: CBC: Recent Labs  Lab 06/08/22 2012 06/09/22 0658 06/10/22 0352  WBC 21.5* 16.5* 10.9*  HGB 9.5* 9.5* 9.4*  HCT 30.0*  29.5* 29.9*  MCV 86.0 87.3 86.9  PLT 260 243 244     Basic Metabolic Panel: Recent Labs  Lab 06/08/22 2012 06/09/22 0658 06/10/22 0352  NA 125* 125* 127*  K 5.1 4.6 4.2  CL 95* 97* 94*  CO2 20* 20* 22  GLUCOSE 88 86 122*  BUN 39* 39* 36*  CREATININE 2.34* 2.08* 1.79*  CALCIUM 8.5* 8.2* 8.4*  MG 2.1  --  2.0     GFR: Estimated Creatinine Clearance: 38.3 mL/min (A) (by C-G formula based on SCr of 1.79 mg/dL (H)).  Liver Function Tests: Recent Labs  Lab 06/10/22 0352  AST 50*  ALT 36  ALKPHOS 64  BILITOT 0.6  PROT 6.3*  ALBUMIN 2.5*   No results for input(s): "LIPASE", "AMYLASE" in the last 168 hours. No results for input(s): "AMMONIA" in the last 168 hours.  Coagulation Profile: No results for input(s): "INR", "PROTIME" in the last 168 hours.  Cardiac Enzymes: No results for input(s): "  CKTOTAL", "CKMB", "CKMBINDEX", "TROPONINI" in the last 168 hours.  BNP (last 3 results) Recent Labs    12/22/21 1149 01/20/22 0000 01/28/22 1059  PROBNP 639* CANCELED 379     Lipid Profile: No results for input(s): "CHOL", "HDL", "LDLCALC", "TRIG", "CHOLHDL", "LDLDIRECT" in the last 72 hours.  Thyroid Function Tests: Recent Labs    06/09/22 0658  FREET4 0.92     Anemia Panel: No results for input(s): "VITAMINB12", "FOLATE", "FERRITIN", "TIBC", "IRON", "RETICCTPCT" in the last 72 hours.  Urine analysis:    Component Value Date/Time   COLORURINE YELLOW 06/09/2022 0901   APPEARANCEUR CLEAR 06/09/2022 0901   LABSPEC 1.011 06/09/2022 0901   PHURINE 6.0 06/09/2022 0901   GLUCOSEU 50 (A) 06/09/2022 0901   HGBUR LARGE (A) 06/09/2022 0901   BILIRUBINUR NEGATIVE 06/09/2022 0901   KETONESUR NEGATIVE 06/09/2022 0901   PROTEINUR 30 (A) 06/09/2022 0901   NITRITE NEGATIVE 06/09/2022 0901   LEUKOCYTESUR MODERATE (A) 06/09/2022 0901    Sepsis Labs: Lactic Acid, Venous    Component Value Date/Time   LATICACIDVEN 1.2 06/08/2022 2012    MICROBIOLOGY: No results  found for this or any previous visit (from the past 240 hour(s)).  RADIOLOGY STUDIES/RESULTS: CT HEAD WO CONTRAST ( )  Result Date: 06/09/2022 CLINICAL DATA:  Fall EXAM: CT HEAD WITHOUT CONTRAST TECHNIQUE: Contiguous axial images were obtained from the base of the skull through the vertex without intravenous contrast. RADIATION DOSE REDUCTION: This exam was performed according to the departmental dose-optimization program which includes automated exposure control, adjustment of the mA and/or kV according to patient size and/or use of iterative reconstruction technique. COMPARISON:  05/10/2022 FINDINGS: Brain: There is no mass, hemorrhage or extra-axial collection. The size and configuration of the ventricles and extra-axial CSF spaces are normal. There is hypoattenuation of the white matter, most commonly indicating chronic small vessel disease. Vascular: No abnormal hyperdensity of the major intracranial arteries or dural venous sinuses. No intracranial atherosclerosis. Skull: The visualized skull base, calvarium and extracranial soft tissues are normal. Sinuses/Orbits: Right mastoid and middle ear effusion, unchanged. The orbits are normal. IMPRESSION: 1. No acute intracranial abnormality. 2. Right mastoid and middle ear effusion, unchanged. Electronically Signed   By: Deatra Robinson M.D.   On: 06/09/2022 22:31   DG Chest Port 1 View  Result Date: 06/09/2022 CLINICAL DATA:  Short of breath, chest tightness EXAM: PORTABLE CHEST 1 VIEW COMPARISON:  06/07/2022 FINDINGS: Single frontal view of the chest demonstrates a stable cardiac silhouette. Increasing interstitial prominence throughout the lungs, with persistent background vascular congestion, compatible with fluid overload and developing interstitial edema. No effusion or pneumothorax. IMPRESSION: 1. Worsening volume status, with developing interstitial edema. Electronically Signed   By: Sharlet Salina M.D.   On: 06/09/2022 19:49   DG Abd Portable  1V  Result Date: 06/09/2022 CLINICAL DATA:  Ureteral calculus EXAM: PORTABLE ABDOMEN - 1 VIEW COMPARISON:  06/08/2022 FINDINGS: Two supine frontal views of the abdomen and pelvis are obtained. Bowel gas pattern is unremarkable without obstruction or ileus. Calcifications are seen within the lower pelvis consistent with vascular and prostate calcifications on recent CT. There is a rounded 7 mm calcification overlying the sacrococcygeal junction, which could reflect the right UVJ calculus seen on recent abdominal CT. Lung bases are clear. IMPRESSION: 1. 7 mm rounded calcification overlying the sacrococcygeal junction, which may reflect the right UVJ calculus seen on previous CT. It is unclear whether this is still within the right UVJ or has progressed to the bladder lumen. Electronically Signed  By: Sharlet Salina M.D.   On: 06/09/2022 16:20   US RENAL  Result Date: 06/08/2022 CLINICAL DATA:  Sepsis secondary to UTI. EXAM: RENAL / URINARY TRACT ULTRASOUND COMPLETE COMPARISON:  CT chest abdomen and pelvis 06/07/2022 FINDINGS: Right Kidney: Renal measurements: 10.3 x 5.8 x 6.1 cm = volume: 227 mL. Echogenicity within normal limits. No mass or hydronephrosis visualized. Left Kidney: Renal measurements: 10.5 x 5.7 x 4.2 cm = volume: 130 mL. Echogenicity within normal limits. No mass or hydronephrosis visualized. Bladder: Within the inferior aspect of the bladder there is nonvascular hyperdensity measuring 3.1 x 1.2 by 2.0 cm. Other: None. IMPRESSION: 1. No hydronephrosis. 2. Nonvascular hyperdensity within the inferior aspect of the bladder measuring 3.1 x 1.2 x 2.0 cm. This may represent debris or blood products. Electronically Signed   By: Darliss Cheney M.D.   On: 06/08/2022 23:25   DG Abd Portable 1V  Result Date: 06/08/2022 CLINICAL DATA:  Sepsis secondary to UTI. Obstruction of right ureteropelvic junction due to stone EXAM: PORTABLE ABDOMEN - 1 VIEW COMPARISON:  CT 06/07/2022 FINDINGS: Best possible  image given patient positioning and body habitus. Single frontal view of the upper abdomen demonstrates multiple loops of gas-filled large and small. No radiopaque calculi. The UVJs were unable to be included in the image at the patient is sitting upright in a chair. Thoracolumbar spondylosis. IMPRESSION: Best possible image given patient positioning and body habitus. The UVJs were unable to be included in the image at the patient is sitting upright. Gaseous distention of the small and large bowel. Electronically Signed   By: Minerva Fester M.D.   On: 06/08/2022 21:13     LOS: 2 days   Jeoffrey Massed, MD  [facility]    To contact the attending provider between 7A-7P or the covering provider during after hours 7P-7A, please log into the web site [email or web address] and access using universal [place] password for that web site. If you do not have the password, please call the hospital operator.  06/10/2022, 11:23 AM

## 2022-06-10 NOTE — Procedures (Addendum)
Procedure laceration repair right eyebrow  Indication: 6 cm laceration  Area prepped with betadine 4 ml of 1% lidocaine use to anesthetize Using 5-0 ethilon suture 7 interrupted sutures placed with good approximation of laceration  Pt tolerated procedure well.  Carollee Herter, DO [facility]

## 2022-06-10 NOTE — Evaluation (Signed)
Physical Therapy Evaluation Patient Details Name: Kyle Farley MRN: [id number] DOB: 1940-03-18 Today's Date: 06/10/2022  History of Present Illness  Kyle Farley is an 82 year old male presenting to [facility] with 1 week of right-sided flank pain and dysuria. Pt transferred to [facility] on 5/21 for evaluation of sepsis secondary to complicated UTI/pyelonephritis in the setting of right UVJ stone. PMHx: PAF on Eliquis, chronic HFpEF, COPD-on 2 L of oxygen at home mostly nocturnally, EtOH use/tobacco.   Clinical Impression  Kyle Farley is 82 y.o. male admitted with above HPI and diagnosis. Patient is currently limited by functional impairments below (see PT problem list). Patient lives with son and reports mod independence with rollator for household mobility at baseline. Currently he requires min assist with bed mobility and transfers with RW. Gait limited today due to tachy up to high 130's in Afib rhythm. Patient will benefit from continued skilled PT interventions to address impairments and progress independence with mobility. EOS pt in recliner and Alarm on and call bell within reach.  Acute PT will follow and progress as able.        Recommendations for follow up therapy are one component of a multi-disciplinary discharge planning process, led by the attending physician.  Recommendations may be updated based on patient status, additional functional criteria and insurance authorization.  Follow Up Recommendations Can patient physically be transported by private vehicle: Yes     Assistance Recommended at Discharge Frequent or constant Supervision/Assistance  Patient can return home with the following  A lot of help with walking and/or transfers;A lot of help with bathing/dressing/bathroom;Assistance with cooking/housework;Assist for transportation;Help with stairs or ramp for entrance    Equipment Recommendations None recommended by PT  Recommendations for Other Services        Functional Status Assessment Patient has had a recent decline in their functional status and demonstrates the ability to make significant improvements in function in a reasonable and predictable amount of time.     Precautions / Restrictions Precautions Precautions: Fall Precaution Comments: fall, CIWA precautions Restrictions Weight Bearing Restrictions: No      Mobility  Bed Mobility Overal bed mobility: Needs Assistance Bed Mobility: Supine to Sit     Supine to sit: Min assist, HOB elevated     General bed mobility comments: cues to initiate and use bed rail. min assist to fully raise trunk and steady balance at EOB.    Transfers Overall transfer level: Needs assistance Equipment used: Rolling walker (2 wheels) Transfers: Sit to/from Stand, Bed to chair/wheelchair/BSC Sit to Stand: Min assist   Step pivot transfers: Min assist       General transfer comment: Min assist for power up from EOB to RW, cues to wait for therapist to stand for safety. Pt required min assist to guide RW with turn to move bed>chair. HR elevated to 130's with Afib rhythm. recovered to 100's with sitting rest.    Ambulation/Gait               General Gait Details: deferred due to tachy  with Afib  Stairs            Wheelchair Mobility    Modified Rankin (Stroke Patients Only)       Balance Overall balance assessment: Needs assistance, History of Falls Sitting-balance support: Feet supported, Bilateral upper extremity supported, No upper extremity supported Sitting balance-Leahy Scale: Fair     Standing balance support: Reliant on assistive device for balance, Bilateral upper extremity supported Standing balance-Leahy  Scale: Poor                               Pertinent Vitals/Pain Pain Assessment Pain Assessment: Faces Faces Pain Scale: No hurt Pain Intervention(s): Limited activity within patient's tolerance, Monitored during session, Repositioned     Home Living Family/patient expects to be discharged to:: Private residence Living Arrangements: Children (son) Available Help at Discharge: Available PRN/intermittently Type of Home: Mobile home Home Access: Stairs to enter Entrance Stairs-Rails: Doctor, general practice of Steps: 5   Home Layout: One level Home Equipment: Cane - single point;Educational psychologist (4 wheels) Additional Comments: Relatives live close by    Prior Function Prior Level of Function : Driving;Needs assist             Mobility Comments: mod I with use of rollator ADLs Comments: Pt reports needing occasional assistance from son for ADLs. Uses AE for LB dressing at baseline. Pt sleeps in recliner.     Hand Dominance   Dominant Hand: Right    Extremity/Trunk Assessment   Upper Extremity Assessment Upper Extremity Assessment: Defer to OT evaluation    Lower Extremity Assessment Lower Extremity Assessment: Generalized weakness    Cervical / Trunk Assessment Cervical / Trunk Assessment: Kyphotic  Communication   Communication: No difficulties  Cognition Arousal/Alertness: Awake/alert Behavior During Therapy: WFL for tasks assessed/performed, Impulsive Overall Cognitive Status: No family/caregiver present to determine baseline cognitive functioning Area of Impairment: Memory, Safety/judgement, Awareness, Problem solving                     Memory: Decreased recall of precautions, Decreased short-term memory   Safety/Judgement: Decreased awareness of safety, Decreased awareness of deficits Awareness: Emergent Problem Solving: Requires verbal cues, Difficulty sequencing General Comments: Pt A&Ox4. pt denying that he fell and pushing it off as just being disoriented.        General Comments      Exercises     Assessment/Plan    PT Assessment Patient needs continued PT services  PT Problem List Decreased strength;Decreased range of motion;Decreased activity  tolerance;Decreased balance;Decreased mobility;Decreased safety awareness;Decreased knowledge of precautions;Decreased knowledge of use of DME;Decreased cognition;Cardiopulmonary status limiting activity       PT Treatment Interventions DME instruction;Cognitive remediation;Neuromuscular re-education;Balance training;Therapeutic exercise;Therapeutic activities;Functional mobility training;Stair training;Gait training;Patient/family education    PT Goals (Current goals can be found in the Care Plan section)  Acute Rehab PT Goals Patient Stated Goal: to get mobile PT Goal Formulation: With patient Time For Goal Achievement: 06/24/22 Potential to Achieve Goals: Fair    Frequency Min 3X/week     Co-evaluation               AM-PAC PT "6 Clicks" Mobility  Outcome Measure Help needed turning from your back to your side while in a flat bed without using bedrails?: A Little Help needed moving from lying on your back to sitting on the side of a flat bed without using bedrails?: A Little Help needed moving to and from a bed to a chair (including a wheelchair)?: A Little Help needed standing up from a chair using your arms (e.g., wheelchair or bedside chair)?: A Little Help needed to walk in hospital room?: A Lot Help needed climbing 3-5 steps with a railing? : Total 6 Click Score: 15    End of Session Equipment Utilized During Treatment: Gait belt Activity Tolerance: Patient tolerated treatment well Patient left: in chair;with call bell/phone  within reach;with chair alarm set Nurse Communication: Mobility status PT Visit Diagnosis: Muscle weakness (generalized) (M62.81);Difficulty in walking, not elsewhere classified (R26.2);Other abnormalities of gait and mobility (R26.89);History of falling (Z91.81)    Time: 1030-1100 PT Time Calculation (min) (ACUTE ONLY): 30 min   Charges:   PT Evaluation $PT Eval Moderate Complexity: 1 Mod PT Treatments $Therapeutic Activity: 8-22 mins         Wynn Maudlin, DPT Acute Rehabilitation Services Office [phone]  06/10/22 12:16 PM

## 2022-06-10 NOTE — Progress Notes (Signed)
MEWS Progress Note  Patient Details Name: BELDON SHAAK MRN: [id number] DOB: [birth year] Today's Date: 06/10/2022   MEWS Flowsheet Documentation:  Assess: MEWS Score Temp: 98.5 F (36.9 C) BP: 117/77 MAP (mmHg): 90 Pulse Rate: (!) 121 ECG Heart Rate: (!) 121 Resp: 20 Level of Consciousness: Alert SpO2: 96 % O2 Device: Nasal Cannula O2 Flow Rate (L/min): 2 L/min Assess: MEWS Score MEWS Temp: 0 MEWS Systolic: 0 MEWS Pulse: 2 MEWS RR: 0 MEWS LOC: 0 MEWS Score: 2 MEWS Score Color: Yellow Assess: SIRS CRITERIA SIRS Temperature : 0 SIRS Respirations : 0 SIRS Pulse: 1 SIRS WBC: 0 SIRS Score Sum : 1 SIRS Temperature : 0 SIRS Pulse: 1 SIRS Respirations : 0 SIRS WBC: 0 SIRS Score Sum : 1 Assess: if the MEWS score is Yellow or Red Were vital signs taken at a resting state?: Yes Focused Assessment: No change from prior assessment Does the patient meet 2 or more of the SIRS criteria?: Yes Does the patient have a confirmed or suspected source of infection?: No MEWS guidelines implemented : Yes, yellow Treat MEWS Interventions: Considered administering scheduled or prn medications/treatments as ordered Take Vital Signs Increase Vital Sign Frequency : Yellow: Q2hr x1, continue Q4hrs until patient remains green for 12hrs Escalate MEWS: Escalate: Yellow: Discuss with charge nurse and consider notifying provider and/or RRT Notify: Charge Nurse/RN Name of Charge Nurse/RN Notified: Desiray RN Provider Notification Provider Name/Title: Dr. Imogene Burn Date Provider Notified: 06/09/22 Time Provider Notified: 2152 Method of Notification: Page Notification Reason: Fall Provider response: See new orders Date of Provider Response: 06/09/22 Time of Provider Response: [id number]   Kallie Locks 06/10/2022, 11:53 AM

## 2022-06-10 NOTE — Progress Notes (Signed)
At approximately 2150 PM I heard a loud 'thud' come from room 12. I was 10 feet from the door, ran over to find the pt face down on the floor with blood pouring out of his forehead. I held pressure with a towel and called for help. We assessed him, took vitals, got him in bed (he was in the chair, he stays in the chair). Notified the physician and he ordered a stat head CT (which was negative). The chair alarm was plugged in but did not alarm. Pt had been compliant with safety and not attempted to ambulate without assistance during this entire hospital stay. Pt was given 1mg  Ativan 10-15 minutes prior to attempt to relax him enough to tolerate wearing the BIPAP. Later in the shift Dr. Imogene Burn came an applied 7 stitches to the patient's lacerated right eyebrow. Family notified.

## 2022-06-11 LAB — GLUCOSE, CAPILLARY
Glucose-Capillary: 134 mg/dL — ABNORMAL HIGH (ref 70–99)
Glucose-Capillary: 150 mg/dL — ABNORMAL HIGH (ref 70–99)
Glucose-Capillary: 156 mg/dL — ABNORMAL HIGH (ref 70–99)
Glucose-Capillary: 136 mg/dL — ABNORMAL HIGH (ref 70–99)

## 2022-06-11 LAB — CBC
HCT: 32.9 % — ABNORMAL LOW (ref 39.0–52.0)
Hemoglobin: 10.5 g/dL — ABNORMAL LOW (ref 13.0–17.0)
MCH: 27.1 pg (ref 26.0–34.0)
MCHC: 31.9 g/dL (ref 30.0–36.0)
MCV: 84.8 fL (ref 80.0–100.0)
Platelets: 288 10*3/uL (ref 150–400)
RBC: 3.88 MIL/uL — ABNORMAL LOW (ref 4.22–5.81)
RDW: 15.9 % — ABNORMAL HIGH (ref 11.5–15.5)
WBC: 8.9 10*3/uL (ref 4.0–10.5)
nRBC: 0.2 % (ref 0.0–0.2)

## 2022-06-11 LAB — BASIC METABOLIC PANEL
Anion gap: 12 (ref 5–15)
BUN: 34 mg/dL — ABNORMAL HIGH (ref 8–23)
CO2: 26 mmol/L (ref 22–32)
Calcium: 8.7 mg/dL — ABNORMAL LOW (ref 8.9–10.3)
Chloride: 89 mmol/L — ABNORMAL LOW (ref 98–111)
Creatinine, Ser: 1.74 mg/dL — ABNORMAL HIGH (ref 0.61–1.24)
GFR, Estimated: 39 mL/min — ABNORMAL LOW (ref >60)
Glucose, Bld: 134 mg/dL — ABNORMAL HIGH (ref 70–99)
Potassium: 4.1 mmol/L (ref 3.5–5.1)
Sodium: 127 mmol/L — ABNORMAL LOW (ref 135–145)

## 2022-06-11 MED ORDER — METOPROLOL SUCCINATE ER 25 MG PO TB24
12.5000 mg | ORAL_TABLET | Freq: Every day | ORAL | Status: DC
  Administered 2022-06-11 – 2022-06-13 (×3): 12.5 mg via ORAL
  Filled 2022-06-11 (×3): qty 1

## 2022-06-11 MED ORDER — BENZONATATE 100 MG PO CAPS
100.0000 mg | ORAL_CAPSULE | Freq: Three times a day (TID) | ORAL | Status: DC | PRN
  Administered 2022-06-11: 100 mg via ORAL
  Filled 2022-06-11: qty 1

## 2022-06-11 MED ORDER — AMIODARONE HCL 200 MG PO TABS
200.0000 mg | ORAL_TABLET | Freq: Two times a day (BID) | ORAL | Status: DC
  Administered 2022-06-11 – 2022-06-13 (×5): 200 mg via ORAL
  Filled 2022-06-11 (×6): qty 1

## 2022-06-11 NOTE — TOC Initial Note (Signed)
Transition of Care [facility]) - Initial/Assessment Note    Patient Details  Name: Kyle Farley MRN: [id number] Date of Birth: [birth year]  Transition of Care [facility]) CM/SW Contact:    Mearl Latin, LCSW Phone Number: 06/11/2022, 9:10 AM  Clinical Narrative:                 CSW received consult for possible SNF at time of discharge. CSW spoke with patient. Patient is very pleasant and reported that he does not want to go to SNF (has been before) and would like to return home with home health services (which he has also had before). CSW discussed equipment needs and patient stated he has a rolling walker, oxygen at night, scooter, and cane.  CSW confirmed PCP and address. Patient states he lives with his son and he is looking forward to their beach trip next week. CSW also discussed alcohol use and patient reported he drinks a few beers each day. CSW offered community resources for cessation but patient stated "at this age I feel like I can do what I want." He provided CSW with permission to coordinate with his son as needed. No further questions reported at this time.    Expected Discharge Plan: Home w Home Health Services Barriers to Discharge: Continued Medical Work up   Patient Goals and CMS Choice Patient states their goals for this hospitalization and ongoing recovery are:: Return home CMS Medicare.gov Compare Post Acute Care list provided to:: Patient Choice offered to / list presented to : Patient [place] ownership interest in [facility].provided to:: Patient    Expected Discharge Plan and Services In-house Referral: Clinical Social Work Discharge Planning Services: CM Consult Post Acute Care Choice: Home Health Living arrangements for the past 2 months: Single Family Home                                      Prior Living Arrangements/Services Living arrangements for the past 2 months: Single Family Home Lives with:: Adult Children Patient language and need  for interpreter reviewed:: Yes Do you feel safe going back to the place where you live?: Yes      Need for Family Participation in Patient Care: Yes (Comment) Care giver support system in place?: Yes (comment) Current home services: DME (scooter, chair walker, cane, home oxygen 3 liters at night) Criminal Activity/Legal Involvement Pertinent to Current Situation/Hospitalization: No - Comment as needed  Activities of Daily Living      Permission Sought/Granted Permission sought to share information with : Facility Medical sales representative, Family Supports Permission granted to share information with : Yes, Verbal Permission Granted  Share Information with NAME: Jerilynn Som  Permission granted to share info w AGENCY: HH  Permission granted to share info w Relationship: Son  Permission granted to share info w Contact Information: [phone]  Emotional Assessment Appearance:: Appears stated age Attitude/Demeanor/Rapport: Engaged Affect (typically observed): Accepting, Appropriate, Pleasant Orientation: : Oriented to Self, Oriented to Place, Oriented to  Time, Oriented to Situation Alcohol / Substance Use: Alcohol Use Psych Involvement: No (comment)  Admission diagnosis:  Sepsis secondary to UTI (HCC) [A41.9, N39.0] Patient Active Problem List   Diagnosis Date Noted   Sepsis secondary to UTI (HCC) 06/08/2022   Right ureteral stone 06/08/2022   AKI (acute kidney injury) (HCC) 06/08/2022   Type 2 diabetes mellitus (HCC) 06/08/2022   Non-seasonal allergic rhinitis due to pollen 06/06/2022  Elevated glucose 05/27/2022   SOB (shortness of breath) 05/10/2022   Chronic respiratory failure with hypoxia (HCC) 05/09/2022   Hyponatremia 04/26/2022   Chronic diastolic CHF (congestive heart failure) (HCC) 04/25/2022   Acute hypoxic respiratory failure (HCC) 04/25/2022   Chronic hyponatremia 04/25/2022   Anemia of chronic disease 04/25/2022   Class 1 obesity 04/25/2022   Encounter for prostate  cancer screening 02/16/2022   Encounter for screening for lung cancer 02/16/2022   Tobacco abuse 02/16/2022   Bilateral leg edema 02/16/2022   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 (HCC) 11/10/2021   Acute on chronic systolic heart failure (HCC) 11/10/2021   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/10/2021   Atherosclerosis of native arteries of extremities with intermittent claudication, bilateral legs (HCC) 11/10/2021   BPH (benign prostatic hyperplasia) 11/10/2021   Acquired hypothyroidism 11/10/2021   Pressure injury of skin 09/16/2020   Femur fracture, right (HCC) 09/12/2020   Alcohol use 09/12/2020   Hypertension    History of kidney stones    GERD (gastroesophageal reflux disease)    Dysrhythmia    Dyspnea    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 [place] [address] Phone: [phone] Fax: [phone]     Social Determinants of Health (SDOH) Social History: SDOH Screenings   Food Insecurity: No Food Insecurity (04/15/2022)  Housing: Low Risk  (04/26/2022)  Transportation Needs: No Transportation Needs (04/26/2022)  Utilities: Not At Risk (04/15/2022)  Alcohol Screen: Low Risk  (04/26/2022)  Depression (PHQ2-9): Medium Risk (05/27/2022)  Financial Resource Strain: Low Risk  (04/26/2022)  Physical Activity: Inactive (04/15/2022)  Social Connections: Moderately Isolated (04/15/2022)  Stress: No Stress Concern Present (04/15/2022)  Tobacco Use: High Risk (06/06/2022)   SDOH Interventions:     Readmission Risk Interventions    04/26/2022    3:28 PM  Readmission Risk Prevention Plan  Transportation Screening Complete   PCP or Specialist Appt within 3-5 Days Complete  HRI or Home Care Consult Complete  Palliative Care Screening Not Applicable  Medication Review (RN Care Manager) Complete

## 2022-06-11 NOTE — Plan of Care (Signed)
°  Problem: Clinical Measurements: °Goal: Will remain free from infection °Outcome: Progressing °  °Problem: Activity: °Goal: Risk for activity intolerance will decrease °Outcome: Progressing °  °Problem: Nutrition: °Goal: Adequate nutrition will be maintained °Outcome: Progressing °  °

## 2022-06-11 NOTE — Progress Notes (Signed)
Culture from [facility]

## 2022-06-11 NOTE — Progress Notes (Signed)
Physical Therapy Treatment Patient Details Name: Kyle Farley MRN: [id number] DOB: 26-Sep-1940 Today's Date: 06/11/2022   History of Present Illness Kyle Farley is an 82 year old male presenting to [facility] with 1 week of right-sided flank pain and dysuria. Pt transferred to [facility] on 5/21 for evaluation of sepsis secondary to complicated UTI/pyelonephritis in the setting of right UVJ stone. PMHx: PAF on Eliquis, chronic HFpEF, COPD-on 2 L of oxygen at home mostly nocturnally, EtOH use/tobacco.    PT Comments    Pt greeted up in chair and agreeable to session with continued progress towards acute goals. Pt able to progress gait this session in room with min guard for safety and RW for support. Pt requiring cues throughout gait for safety as pt with tendency to move quickly and demonstrating poor insight into current deficits. Pt HR elevated up to 140s non-sustained with activity. Pt educated on continued RW use for safety and decreased fall risk as well as appropriate activity progression with pt verbalizing understanding. Current plan remains appropriate to address deficits and maximize functional independence and decrease caregiver burden. Pt continues to benefit from skilled PT services to progress toward functional mobility goals.     Recommendations for follow up therapy are one component of a multi-disciplinary discharge planning process, led by the attending physician.  Recommendations may be updated based on patient status, additional functional criteria and insurance authorization.  Follow Up Recommendations  Can patient physically be transported by private vehicle: Yes    Assistance Recommended at Discharge Frequent or constant Supervision/Assistance  Patient can return home with the following A lot of help with walking and/or transfers;A lot of help with bathing/dressing/bathroom;Assistance with cooking/housework;Assist for transportation;Help with stairs or ramp for  entrance   Equipment Recommendations  None recommended by PT    Recommendations for Other Services       Precautions / Restrictions Precautions Precautions: Fall Precaution Comments: fall, CIWA precautions Restrictions Weight Bearing Restrictions: No     Mobility  Bed Mobility Overal bed mobility: Needs Assistance             General bed mobility comments: pt up in chair on arrival    Transfers Overall transfer level: Needs assistance Equipment used: Rolling walker (2 wheels) Transfers: Sit to/from Stand, Bed to chair/wheelchair/BSC Sit to Stand: Min assist, Min guard           General transfer comment: min A intitally down to min guard for safety, cues to steady self before beginning gait    Ambulation/Gait Ambulation/Gait assistance: Min guard Gait Distance (Feet): 20 Feet (+20' + 30') Assistive device: Rolling walker (2 wheels) Gait Pattern/deviations: Step-through pattern, Decreased stride length, Trunk flexed Gait velocity: decr     General Gait Details: cues for upright posture and RW proximity and min guard for safety, no overt LOB, HR variable in Afib throughout up to 139bpm non sustained   Stairs             Wheelchair Mobility    Modified Rankin (Stroke Patients Only)       Balance Overall balance assessment: Needs assistance, History of Falls Sitting-balance support: Feet supported, Bilateral upper extremity supported, No upper extremity supported Sitting balance-Leahy Scale: Fair     Standing balance support: Reliant on assistive device for balance, Bilateral upper extremity supported Standing balance-Leahy Scale: Poor Standing balance comment: BUEs supported on RW, intermittent ability to offload single UE from RW.  Cognition Arousal/Alertness: Awake/alert Behavior During Therapy: WFL for tasks assessed/performed, Impulsive Overall Cognitive Status: No family/caregiver present to  determine baseline cognitive functioning Area of Impairment: Memory, Safety/judgement, Awareness, Problem solving                     Memory: Decreased recall of precautions, Decreased short-term memory   Safety/Judgement: Decreased awareness of safety, Decreased awareness of deficits Awareness: Emergent Problem Solving: Requires verbal cues, Difficulty sequencing General Comments: pt diminishing fact he fell, stating he was just disoriented in dark        Exercises      General Comments General comments (skin integrity, edema, etc.): VSS on supplemental O2 during activity, SpO2 >93% on RA at rest      Pertinent Vitals/Pain Pain Assessment Pain Assessment: Faces Faces Pain Scale: No hurt Pain Intervention(s): Monitored during session    Home Living                          Prior Function            PT Goals (current goals can now be found in the care plan section) Acute Rehab PT Goals Patient Stated Goal: to get mobile PT Goal Formulation: With patient Time For Goal Achievement: 06/24/22 Progress towards PT goals: Progressing toward goals    Frequency    Min 3X/week      PT Plan      Co-evaluation              AM-PAC PT "6 Clicks" Mobility   Outcome Measure  Help needed turning from your back to your side while in a flat bed without using bedrails?: A Little Help needed moving from lying on your back to sitting on the side of a flat bed without using bedrails?: A Little Help needed moving to and from a bed to a chair (including a wheelchair)?: A Little Help needed standing up from a chair using your arms (e.g., wheelchair or bedside chair)?: A Little Help needed to walk in hospital room?: A Little Help needed climbing 3-5 steps with a railing? : A Lot 6 Click Score: 17    End of Session Equipment Utilized During Treatment: Gait belt Activity Tolerance: Patient tolerated treatment well Patient left: in chair;with call bell/phone  within reach;with chair alarm set;Other (comment) (with fall mat placed) Nurse Communication: Mobility status PT Visit Diagnosis: Muscle weakness (generalized) (M62.81);Difficulty in walking, not elsewhere classified (R26.2);Other abnormalities of gait and mobility (R26.89);History of falling (Z91.81)     Time: 1610-9604 PT Time Calculation (min) (ACUTE ONLY): 23 min  Charges:  $Gait Training: 8-22 mins $Therapeutic Activity: 8-22 mins                     Aubreyana Saltz R. PTA Acute Rehabilitation Services Office: [phone]   Catalina Antigua 06/11/2022, 10:08 AM

## 2022-06-11 NOTE — TOC Progression Note (Signed)
Transition of Care [facility]) - Progression Note    Patient Details  Name: Kyle Farley MRN: [id number] Date of Birth: [birth year]  Transition of Care [facility]) CM/SW Contact  Gordy Clement, RN Phone Number: 06/11/2022, 4:20 PM  Clinical Narrative:     Patient has refused SNF and wants to return home   Frances Furbish will provide [facility] PT and OT   There have been no reqs for DME at this point. No additional TOC needs  Family to transport     Expected Discharge Plan: Home w Home Health Services Barriers to Discharge: Continued Medical Work up  Expected Discharge Plan and Services In-house Referral: Clinical Social Work Discharge Planning Services: CM Consult Post Acute Care Choice: Home Health Living arrangements for the past 2 months: Single Family Home                                       Social Determinants of Health (SDOH) Interventions SDOH Screenings   Food Insecurity: No Food Insecurity (04/15/2022)  Housing: Low Risk  (04/26/2022)  Transportation Needs: No Transportation Needs (04/26/2022)  Utilities: Not At Risk (04/15/2022)  Alcohol Screen: Low Risk  (04/26/2022)  Depression (PHQ2-9): Medium Risk (05/27/2022)  Financial Resource Strain: Low Risk  (04/26/2022)  Physical Activity: Inactive (04/15/2022)  Social Connections: Moderately Isolated (04/15/2022)  Stress: No Stress Concern Present (04/15/2022)  Tobacco Use: High Risk (06/06/2022)    Readmission Risk Interventions    04/26/2022    3:28 PM  Readmission Risk Prevention Plan  Transportation Screening Complete  PCP or Specialist Appt within 3-5 Days Complete  HRI or Home Care Consult Complete  Palliative Care Screening Not Applicable  Medication Review (RN Care Manager) Complete

## 2022-06-11 NOTE — Progress Notes (Signed)
Occupational Therapy Treatment Patient Details Name: Kyle Farley MRN: [id number] DOB: 1940-01-22 Today's Date: 06/11/2022   History of present illness 82 yo male presenting to [facility] with 1 week of right-sided flank pain and dysuria. Pt transferred to [facility] on 5/21 for evaluation of sepsis secondary to complicated UTI/pyelonephritis in the setting of right UVJ stone. PMHx: PAF on Eliquis, chronic HFpEF, COPD-on 2 L of oxygen at home mostly nocturnally, EtOH use/tobacco.   OT comments  Pt progressed this session with ambulation with RW with L side striking surfaces and desaturation to 81%. Pt declined O2 and states "I am fine". Pt high fall risk due to lack of awareness to fall with desaturation and elevated HR 140. Pt states "I just take my inhaler and its better". Pt provided energy conservation and recommended to sit for showering while in recovering. Pt eager to leave town to go to [employer] and leaving all planning to his son to manage. Recommendation HHOT due to refusal for skilled community based rehab.    Recommendations for follow up therapy are one component of a multi-disciplinary discharge planning process, led by the attending physician.  Recommendations may be updated based on patient status, additional functional criteria and insurance authorization.    Assistance Recommended at Discharge Intermittent Supervision/Assistance  Patient can return home with the following  A little help with walking and/or transfers;A little help with bathing/dressing/bathroom;Assistance with cooking/housework;Assist for transportation;Help with stairs or ramp for entrance   Equipment Recommendations  None recommended by OT    Recommendations for Other Services      Precautions / Restrictions Precautions Precautions: Fall Precaution Comments: fall;CIWA precautions Restrictions Weight Bearing Restrictions: No       Mobility Bed Mobility               General bed mobility  comments: up in chair    Transfers Overall transfer level: Needs assistance Equipment used: Rolling walker (2 wheels) Transfers: Sit to/from Stand Sit to Stand: Min guard           General transfer comment: cues for line management. pt removed 02 on arrival with sats 93% pt iwth HR increased 130 with standing     Balance Overall balance assessment: Needs assistance Sitting-balance support: No upper extremity supported, Feet supported Sitting balance-Leahy Scale: Fair     Standing balance support: Bilateral upper extremity supported, During functional activity, Reliant on assistive device for balance Standing balance-Leahy Scale: Fair Standing balance comment: pt benefits from 1 hand on RW at all time                           ADL either performed or assessed with clinical judgement   ADL Overall ADL's : Needs assistance/impaired Eating/Feeding: Modified independent;Sitting   Grooming: Wash/dry hands;Min guard;Standing Grooming Details (indicate cue type and reason): pt needed cues to locate the soap / pt terminates task and does not dry hands. pt cues to dry hands and pt states "nah they can just dry"                 Toilet Transfer: Min Pension scheme manager Details (indicate cue type and reason): standing over commode with mod cues for safety with RW. pt noted to hit RW on L side throughout session and in tight bathroom space         Functional mobility during ADLs: Min guard;Rolling walker (2 wheels) General ADL Comments: pt noted to walk into objects throughout  session on the L side. pt reports its due to the RW and his is "not like this" pt has a rollator at baseline. pt also states "sometimes i dont think i am going to make it but i do" when talking about near falls    Extremity/Trunk Assessment Upper Extremity Assessment Upper Extremity Assessment: Overall WFL for tasks assessed   Lower Extremity Assessment Lower Extremity Assessment: Overall  WFL for tasks assessed        Vision       Perception     Praxis      Cognition Arousal/Alertness: Awake/alert Behavior During Therapy: WFL for tasks assessed/performed Overall Cognitive Status: No family/caregiver present to determine baseline cognitive functioning                                 General Comments: pt able to verbalize reason for admission and pending trips. pt reports "my son does all that" when asked more details regarding trip needs/ planning ahead from an energy conservation need        Exercises      Shoulder Instructions       General Comments VSS on RA. HR increased 140, desat to 81% pt states that he is fine . pt lacks awareness to desaturation. providing a handout on energy conservation as reference. pt advised to have a seat for showering while on vacation. pt states "most beach places have those out side showers or a hot tub" pt advised warm water with respiratory status. pt was uncertain if either place had an outside shower or hot tub when asked more definitely.    Pertinent Vitals/ Pain       Pain Assessment Pain Assessment: No/denies pain  Home Living                                          Prior Functioning/Environment              Frequency  Min 2X/week        Progress Toward Goals  OT Goals(current goals can now be found in the care plan section)  Progress towards OT goals: Progressing toward goals  Acute Rehab OT Goals Patient Stated Goal: to go to the beach June 2 OT Goal Formulation: With patient Time For Goal Achievement: 06/23/22 Potential to Achieve Goals: Good ADL Goals Pt Will Perform Lower Body Dressing: with modified independence;sit to/from stand;with adaptive equipment Pt Will Transfer to Toilet: with modified independence;ambulating;bedside commode Pt Will Perform Toileting - Clothing Manipulation and hygiene: with modified independence;sit to/from stand Additional ADL  Goal #1: Pt will tolerate standing ADLs for 8 minutes without need for seated rest break  Plan Discharge plan remains appropriate    Co-evaluation                 AM-PAC OT "6 Clicks" Daily Activity     Outcome Measure   Help from another person eating meals?: None Help from another person taking care of personal grooming?: A Little Help from another person toileting, which includes using toliet, bedpan, or urinal?: A Little Help from another person bathing (including washing, rinsing, drying)?: A Little Help from another person to put on and taking off regular upper body clothing?: A Little Help from another person to put on and taking off regular lower body clothing?: A  Little 6 Click Score: 19    End of Session Equipment Utilized During Treatment: Rolling walker (2 wheels);Gait belt  OT Visit Diagnosis: Unsteadiness on feet (R26.81);Other abnormalities of gait and mobility (R26.89);Muscle weakness (generalized) (M62.81);Pain   Activity Tolerance Patient tolerated treatment well   Patient Left in chair;with call bell/phone within reach   Nurse Communication Mobility status;Precautions        Time: 1011-1040 OT Time Calculation (min): 29 min  Charges: OT General Charges $OT Visit: 1 Visit OT Treatments $Self Care/Home Management : 23-37 mins   Brynn, OTR/L  Acute Rehabilitation Services Office: [phone] .   Mateo Flow 06/11/2022, 10:55 AM

## 2022-06-11 NOTE — Care Management Important Message (Signed)
Important Message  Patient Details  Name: AQUARIUS GEISSLER MRN: [id number] Date of Birth: 17-Jun-1940   Medicare Important Message Given:  Yes     Christianne Zacher Stefan Church 06/11/2022, 3:39 PM

## 2022-06-11 NOTE — Progress Notes (Signed)
PROGRESS NOTE        PATIENT DETAILS Name: Kyle Farley Age: 82 y.o. Sex: male Date of Birth: October 14, 1940 Admit Date: 06/08/2022 Admitting Physician [name], MD PCP:[name], [name], MD  Brief Summary: Patient is a 82 y.o.  male history of PAF on Eliquis, chronic HFpEF, COPD-on 2 L of oxygen at home mostly nocturnally, EtOH use/tobacco use-who was transferred from [facility] health for evaluation of sepsis secondary to complicated UTI/pyelonephritis in the setting of right UVJ stone.  Significant events: 5/21>> transferred to [facility] from [facility] 5/22>> exertional dyspnea-x-ray with interstitial edema-IV Lasix.  IVF to [facility] 5/23>> mechanical fall-right eyebrow laceration-sutured.  Significant studies: 5/20>> CT abdomen/pelvis: Moderate right hydronephrosis/hydroureter-6 mm right ureterovesical junction calculus. 5/21>> renal ultrasound: No hydronephrosis. 5/22>> x-ray abdomen: 7 mm rounded calcification overlying the sacrococcygeal junction-Per urology-prior calculi now in the bladder.  Significant microbiology data: 5/19>> blood culture (done at [facility]): E. coli (spoke with microbiology lab at [facility] 5/23)  Procedures: None  Consults: Urology  Subjective: He does not want to go to SNF-feels much better.  Objective: Vitals: Blood pressure 100/80, pulse 99, temperature 97.7 F (36.5 C), temperature source Oral, resp. rate 20, weight 91 kg, SpO2 95 %.   Exam: Gen Exam:Alert awake-not in any distress HEENT:atraumatic, normocephalic Chest: B/L clear to auscultation anteriorly CVS:S1S2 regular Abdomen:soft non tender, non distended Extremities:no edema Neurology: Non focal Skin: no rash  Pertinent Labs/Radiology:    Latest Ref Rng & Units 06/11/2022    3:53 AM 06/10/2022    3:52 AM 06/09/2022    6:58 AM  CBC  WBC 4.0 - 10.5 K/uL 8.9  10.9  16.5   Hemoglobin 13.0 - 17.0 g/dL 36.6  9.4  9.5   Hematocrit 39.0 - 52.0 % 32.9  29.9  29.5    Platelets 150 - 400 K/uL 288  244  243     Lab Results  Component Value Date   NA 127 (L) 06/11/2022   K 4.1 06/11/2022   CL 89 (L) 06/11/2022   CO2 26 06/11/2022      Assessment/Plan: Sepsis due to complicated UTI and E. coli bacteremia in the setting of 6 mm right UVJ stone Sepsis physiology has resolved Per urology-stone in right UVJ has passed into the bladder Continue IV Rocephin-will plan to transition to oral antibiotics in the next day or so. Urology not planning on any cystoscopy/stent placement.  AKI Likely hemodynamically mediated Continues to improve with supportive care.  Hyponatremia Mild and asymptomatic Improving with supportive care Follow electrolytes.  Acute on chronic chronic HFpEF Developed exertional dyspnea-CXR on 5/22 showed pulm edema IV Lasix given 5/22 with significant improvement Continue oral diuretics-volume status has now stabilized.  PAF Brief run of A-fib with RVR this morning Will resume low-dose beta-blocker and amiodarone today. Remains on Eliquis.  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  EtOH use No withdrawal symptoms currently Ativan per CIWA protocol  Obesity: Estimated body mass index is 29.64 kg/m as calculated from  the following:   Height as of 06/03/22: 5\' 9"  (1.753 m).   Weight as of this encounter: 91 kg.   Code status:   Code Status: DNR   DVT Prophylaxis: apixaban (ELIQUIS) tablet 2.5 mg Start: 06/10/22 1215 SCDs Start: 06/08/22 1955 apixaban (ELIQUIS) tablet 2.5 mg    Family Communication:  Son-[name] updated over the phone on 5/23   Disposition Plan: Status is: Inpatient Remains inpatient appropriate because: Severity of illness   Planned Discharge Destination:Home   Diet: Diet Order             Diet heart healthy/carb modified Room service appropriate? Yes; Fluid consistency: Thin  Diet effective now                     Antimicrobial agents: Anti-infectives (From admission, onward)    Start     Dose/Rate Route Frequency Ordered Stop   06/08/22 2100  cefTRIAXone (ROCEPHIN) 2 g in sodium chloride 0.9 % 100 mL IVPB        2 g 200 mL/hr over 30 Minutes Intravenous Every 24 hours 06/08/22 1959 06/15/22 2059        MEDICATIONS: Scheduled Meds:  apixaban  2.5 mg Oral BID   arformoterol  15 mcg Nebulization BID   atorvastatin  40 mg Oral Daily   budesonide (PULMICORT) nebulizer solution  0.25 mg Nebulization BID   folic acid  1 mg Oral Daily   insulin aspart  0-5 Units Subcutaneous QHS   insulin aspart  0-6 Units Subcutaneous TID WC   levothyroxine  50 mcg Oral Q0600   LORazepam  0-4 mg Intravenous Q8H   multivitamin with minerals  1 tablet Oral Daily   pantoprazole  40 mg Oral Daily   revefenacin  175 mcg Nebulization Daily   sodium chloride flush  3 mL Intravenous Q12H   tamsulosin  0.4 mg Oral Daily   thiamine  100 mg Oral Daily   Or   thiamine  100 mg Intravenous Daily   torsemide  20 mg Oral BID   Continuous Infusions:  cefTRIAXone (ROCEPHIN)  IV 2 g (06/10/22 2132)   PRN Meds:.acetaminophen **OR** acetaminophen, ipratropium-albuterol, LORazepam **OR** LORazepam, melatonin, oxyCODONE, prochlorperazine   I have personally reviewed following labs and imaging studies  LABORATORY DATA: CBC: Recent Labs  Lab 06/08/22 2012 06/09/22 0658 06/10/22 0352 06/11/22 0353  WBC 21.5* 16.5* 10.9* 8.9  HGB 9.5* 9.5* 9.4* 10.5*  HCT 30.0* 29.5* 29.9* 32.9*  MCV 86.0 87.3 86.9 84.8  PLT 260 243 244 288     Basic Metabolic  Panel: Recent Labs  Lab 06/08/22 2012 06/09/22 0658 06/10/22 0352 06/11/22 0353  NA 125* 125* 127* 127*  K 5.1 4.6 4.2 4.1  CL 95* 97* 94* 89*  CO2 20* 20* 22 26  GLUCOSE 88 86 122* 134*  BUN 39* 39* 36* 34*  CREATININE 2.34* 2.08* 1.79* 1.74*  CALCIUM 8.5* 8.2* 8.4* 8.7*  MG 2.1  --  2.0  --      GFR: Estimated Creatinine Clearance: 37.7 mL/min (A) (by C-G formula based on SCr of 1.74 mg/dL (H)).  Liver Function Tests: Recent Labs  Lab 06/10/22 0352  AST 50*  ALT 36  ALKPHOS 64  BILITOT 0.6  PROT 6.3*  ALBUMIN 2.5*    No results for input(s): "LIPASE", "AMYLASE" in the last 168 hours. No results for input(s): "AMMONIA" in the last 168 hours.  Coagulation Profile: No results for input(s): "INR", "PROTIME" in the last 168 hours.  Cardiac Enzymes: No results for input(s): "CKTOTAL", "CKMB", "CKMBINDEX", "TROPONINI" in the last 168 hours.  BNP (last 3 results) Recent Labs    12/22/21 1149 01/20/22 0000 01/28/22 1059  PROBNP 639* CANCELED 379     Lipid Profile: No results for input(s): "CHOL", "HDL", "LDLCALC", "TRIG", "CHOLHDL", "LDLDIRECT" in the last 72 hours.  Thyroid Function Tests: Recent Labs    06/09/22 0658  FREET4 0.92     Anemia Panel: No results for input(s): "VITAMINB12", "FOLATE", "FERRITIN", "TIBC", "IRON", "RETICCTPCT" in the last 72 hours.  Urine analysis:    Component Value Date/Time   COLORURINE YELLOW 06/09/2022 0901   APPEARANCEUR CLEAR 06/09/2022 0901   LABSPEC 1.011 06/09/2022 0901   PHURINE 6.0 06/09/2022 0901   GLUCOSEU 50 (A) 06/09/2022 0901   HGBUR LARGE (A) 06/09/2022 0901   BILIRUBINUR NEGATIVE 06/09/2022 0901   KETONESUR NEGATIVE 06/09/2022 0901   PROTEINUR 30 (A) 06/09/2022 0901   NITRITE NEGATIVE 06/09/2022 0901   LEUKOCYTESUR MODERATE (A) 06/09/2022 0901    Sepsis Labs: Lactic Acid, Venous    Component Value Date/Time   LATICACIDVEN 1.2 06/08/2022 2012    MICROBIOLOGY: No results found for this or  any previous visit (from the past 240 hour(s)).  RADIOLOGY STUDIES/RESULTS: CT HEAD WO CONTRAST ( )  Result Date: 06/09/2022 CLINICAL DATA:  Fall EXAM: CT HEAD WITHOUT CONTRAST TECHNIQUE: Contiguous axial images were obtained from the base of the skull through the vertex without intravenous contrast. RADIATION DOSE REDUCTION: This exam was performed according to the departmental dose-optimization program which includes automated exposure control, adjustment of the mA and/or kV according to patient size and/or use of iterative reconstruction technique. COMPARISON:  05/10/2022 FINDINGS: Brain: There is no mass, hemorrhage or extra-axial collection. The size and configuration of the ventricles and extra-axial CSF spaces are normal. There is hypoattenuation of the white matter, most commonly indicating chronic small vessel disease. Vascular: No abnormal hyperdensity of the major intracranial arteries or dural venous sinuses. No intracranial atherosclerosis. Skull: The visualized skull base, calvarium and extracranial soft tissues are normal. Sinuses/Orbits: Right mastoid and middle ear effusion, unchanged. The orbits are normal. IMPRESSION: 1. No acute intracranial abnormality. 2. Right mastoid and middle ear effusion, unchanged. Electronically Signed   By: Deatra Robinson M.D.   On: 06/09/2022 22:31   DG Chest Port 1 View  Result Date: 06/09/2022 CLINICAL DATA:  Short of breath, chest tightness EXAM: PORTABLE CHEST 1 VIEW COMPARISON:  06/07/2022 FINDINGS: Single frontal view of the chest demonstrates a stable cardiac silhouette. Increasing interstitial prominence throughout the lungs, with persistent background vascular congestion, compatible with fluid overload and developing interstitial edema. No effusion or pneumothorax. IMPRESSION: 1. Worsening volume status, with developing interstitial edema. Electronically Signed   By: Sharlet Salina M.D.   On: 06/09/2022 19:49   DG Abd Portable 1V  Result Date:  06/09/2022 CLINICAL DATA:  Ureteral calculus EXAM: PORTABLE ABDOMEN - 1 VIEW COMPARISON:  06/08/2022 FINDINGS: Two supine frontal views of the abdomen and pelvis are obtained. Bowel gas pattern is unremarkable without obstruction or ileus. Calcifications are seen within the lower pelvis consistent with vascular and prostate calcifications on recent CT. There is a rounded 7 mm calcification overlying the sacrococcygeal junction, which could reflect the right UVJ calculus seen on recent abdominal CT. Lung bases are clear. IMPRESSION: 1. 7 mm rounded calcification overlying the sacrococcygeal junction, which may reflect the right UVJ calculus seen on previous CT. It is unclear whether this is still within the right UVJ or has progressed  to the bladder lumen. Electronically Signed   By: Sharlet Salina M.D.   On: 06/09/2022 16:20     LOS: 3 days   Jeoffrey Massed, MD  [facility]    To contact the attending provider between 7A-7P or the covering provider during after hours 7P-7A, please log into the web site [email or web address] and access using universal [place] password for that web site. If you do not have the password, please call the hospital operator.  06/11/2022, 11:54 AM

## 2022-06-12 LAB — GLUCOSE, CAPILLARY
Glucose-Capillary: 210 mg/dL — ABNORMAL HIGH (ref 70–99)
Glucose-Capillary: 105 mg/dL — ABNORMAL HIGH (ref 70–99)
Glucose-Capillary: 111 mg/dL — ABNORMAL HIGH (ref 70–99)
Glucose-Capillary: 149 mg/dL — ABNORMAL HIGH (ref 70–99)

## 2022-06-12 LAB — BASIC METABOLIC PANEL
Anion gap: 15 (ref 5–15)
BUN: 42 mg/dL — ABNORMAL HIGH (ref 8–23)
CO2: 27 mmol/L (ref 22–32)
Calcium: 8.7 mg/dL — ABNORMAL LOW (ref 8.9–10.3)
Chloride: 88 mmol/L — ABNORMAL LOW (ref 98–111)
Creatinine, Ser: 1.82 mg/dL — ABNORMAL HIGH (ref 0.61–1.24)
GFR, Estimated: 37 mL/min — ABNORMAL LOW (ref >60)
Glucose, Bld: 142 mg/dL — ABNORMAL HIGH (ref 70–99)
Potassium: 3.8 mmol/L (ref 3.5–5.1)
Sodium: 130 mmol/L — ABNORMAL LOW (ref 135–145)

## 2022-06-12 LAB — CBC
HCT: 32.9 % — ABNORMAL LOW (ref 39.0–52.0)
Hemoglobin: 10.6 g/dL — ABNORMAL LOW (ref 13.0–17.0)
MCH: 27.1 pg (ref 26.0–34.0)
MCHC: 32.2 g/dL (ref 30.0–36.0)
MCV: 84.1 fL (ref 80.0–100.0)
Platelets: 292 10*3/uL (ref 150–400)
RBC: 3.91 MIL/uL — ABNORMAL LOW (ref 4.22–5.81)
RDW: 15.9 % — ABNORMAL HIGH (ref 11.5–15.5)
WBC: 10.3 10*3/uL (ref 4.0–10.5)
nRBC: 0 % (ref 0.0–0.2)

## 2022-06-12 IMAGING — CT CT PELVIS W/O CM
2 of 4 series · 14 of 46 positions shown, 16 images · non-contrast
Comparison: Radiograph 07/14/2020

CLINICAL DATA: Pelvic trauma suspected fracture or dislocation of
right prosthetic hip s/p fall

EXAM:
CT PELVIS WITHOUT CONTRAST
TECHNIQUE: Multidetector CT imaging of the pelvis was performed following the
standard protocol without intravenous contrast.

[Series 7: coronal st · coronal · 0.50mm/px · 3 of 191 slices shown]
[im 64/191  soft-tissue]
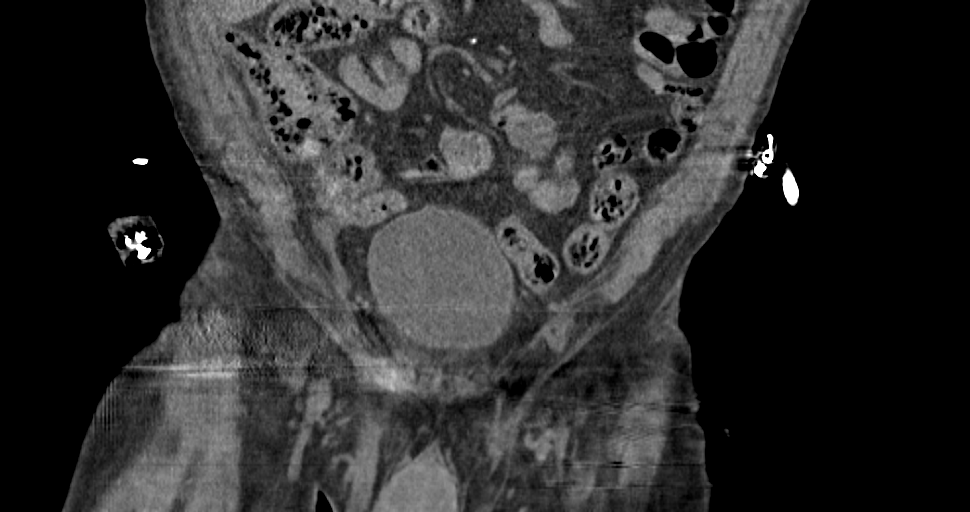
[im 85/191  soft-tissue]
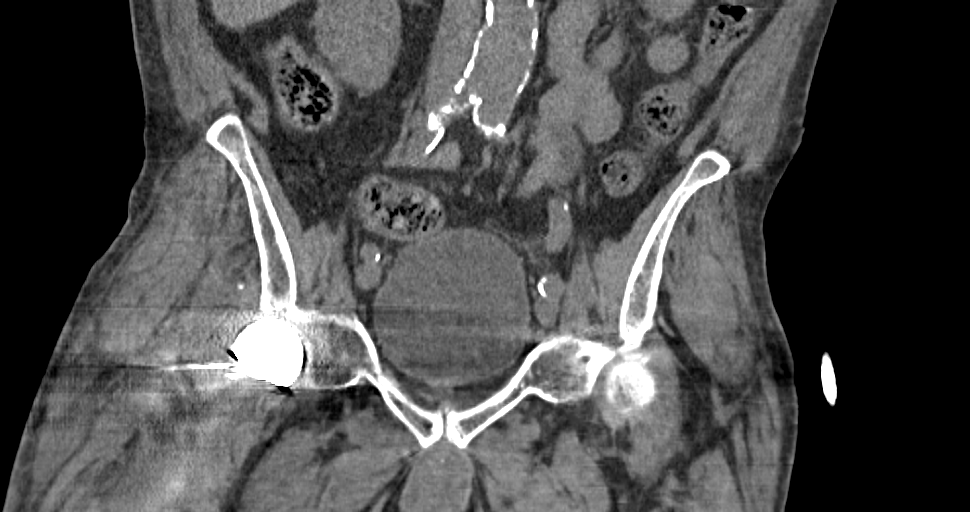
[im 106/191  soft-tissue]
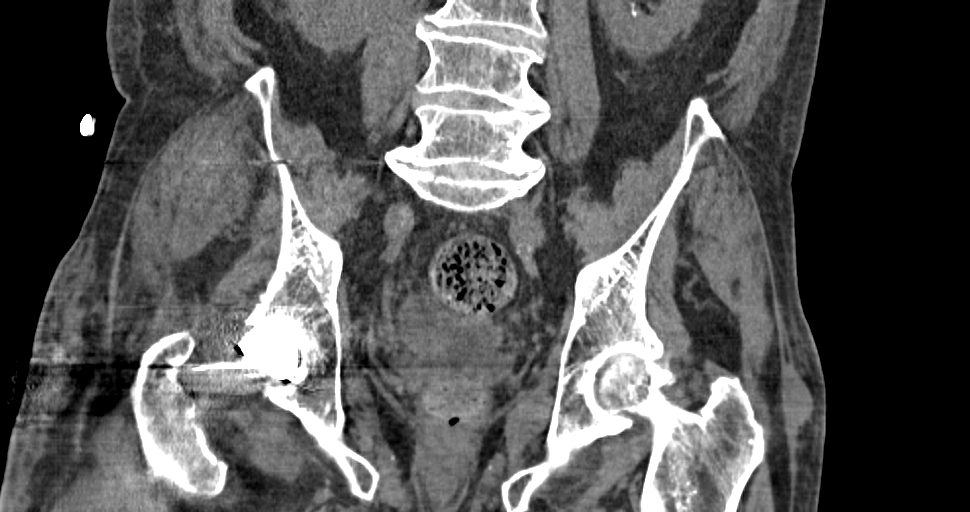

[Series 9: pelvis thin · axial · 0.96mm/px · z∈[+305,+519]mm · 11 of 429 slices shown, 13 images]
[im 36/429  soft-tissue]
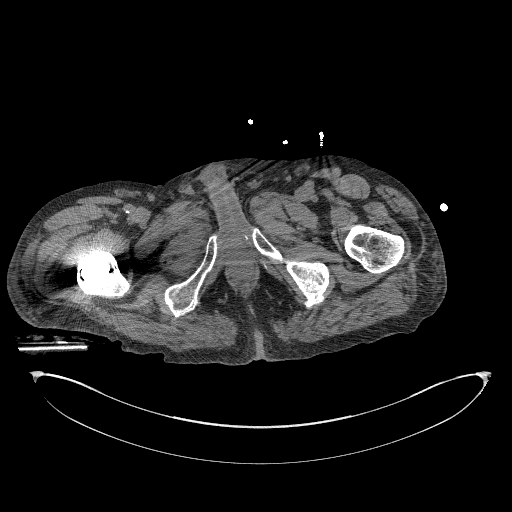
[im 36/429  bone]
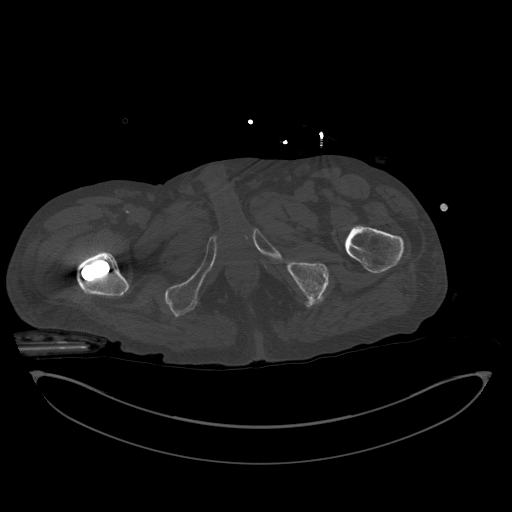
[im 72/429  soft-tissue]
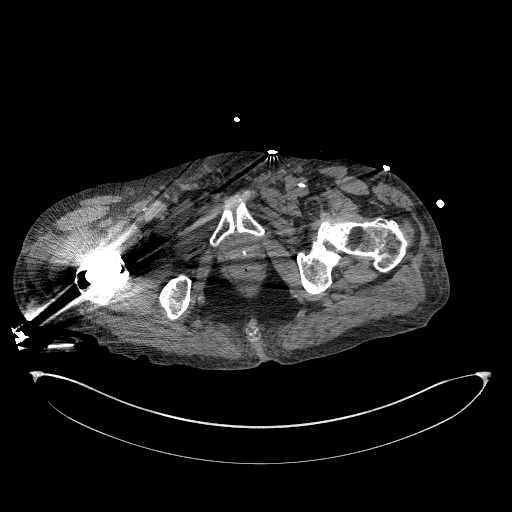
[im 108/429  soft-tissue]
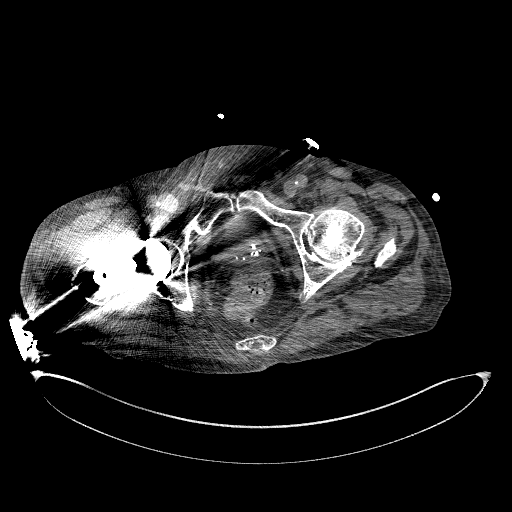
[im 143/429  soft-tissue]
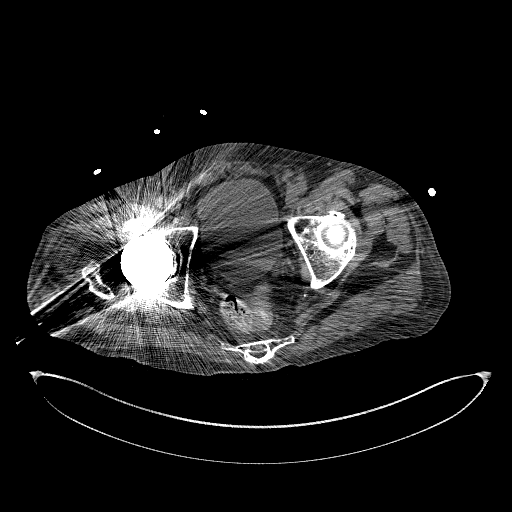
[im 179/429  soft-tissue]
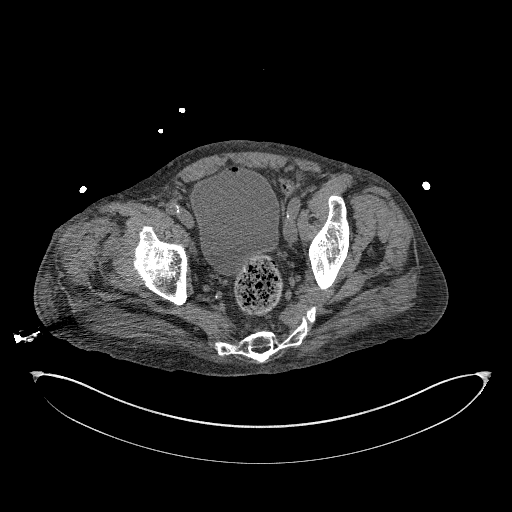
[im 215/429  soft-tissue]
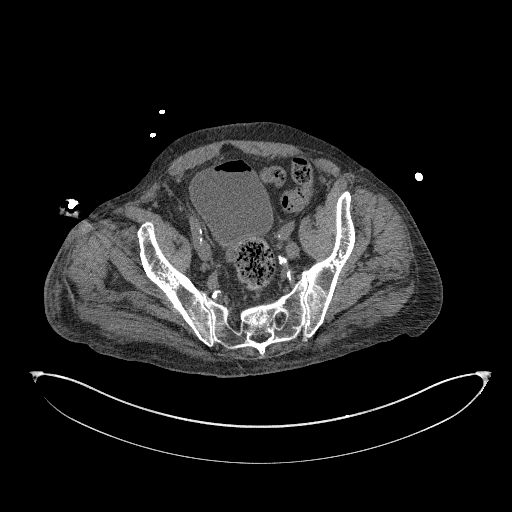
[im 250/429  soft-tissue]
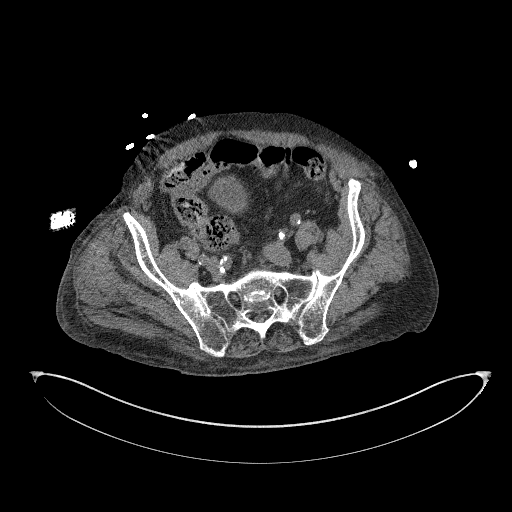
[im 286/429  soft-tissue]
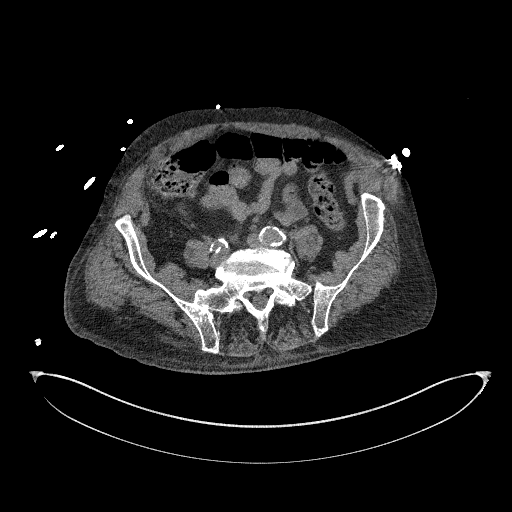
[im 322/429  soft-tissue]
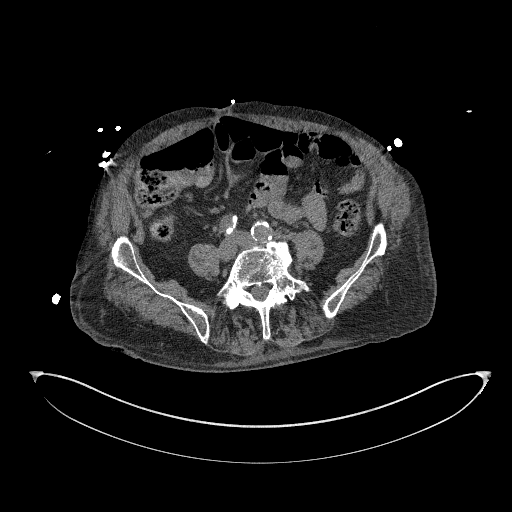
[im 322/429  bone]
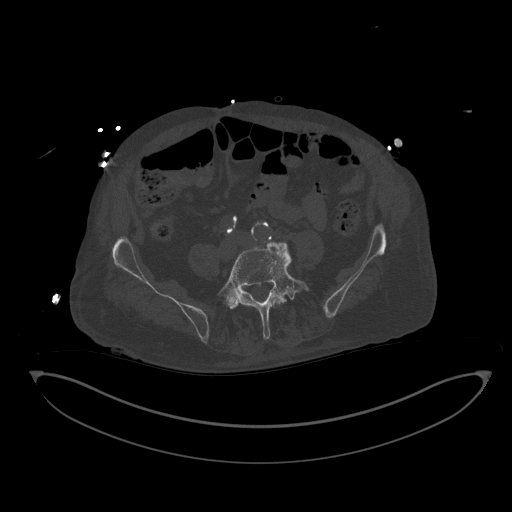
[im 357/429  soft-tissue]
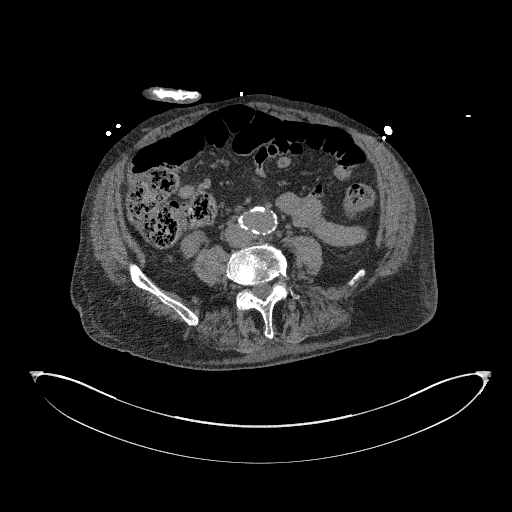
[im 393/429  soft-tissue]
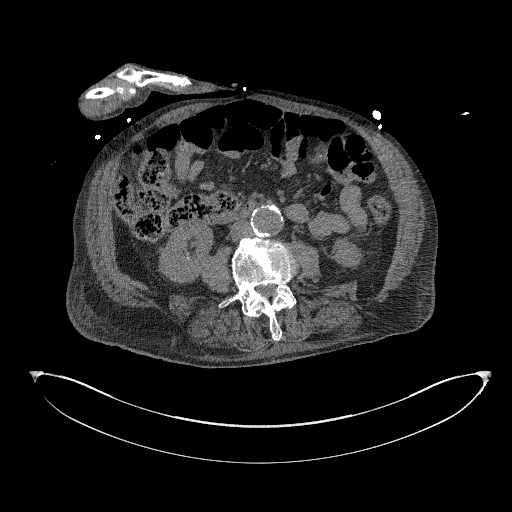

[14 of 46 positions shown; findings below may reference images not displayed]

FINDINGS: Bones/Joint/Cartilage

There is a partially visualized periprosthetic fracture along the
right femoral stem extending through the lesser trochanter. The
femoral head component is well seated within the acetabular cup.
There is a nondisplaced right inferior pubic ramus fracture (series
4, image 395). There is no pubic diastasis. No SI joint widening.
There is heterotopic ossification along the anterior aspect of the
right hip.

There is moderate left hip osteoarthritis. There is bilateral SI
joint degenerative change.

There is lower lumbar spine degenerative changes with posterior disc
bulging and bilateral facet arthropathy likely resulting in
mild-to-moderate spinal canal and neural foraminal narrowing.

Ligaments

Suboptimally assessed by CT.

Muscles and Tendons

No significant muscle atrophy.

Soft tissues

There is soft tissue swelling along the right hip.

Abdominopelvic structures

Unchanged nonobstructive right renal calculi. Unchanged mildly
dilated infrarenal abdominal aorta measuring up to 3.2 cm. Please
see recommendations on prior CT abdomen pelvis on February 09, 2020.
There is intraluminal gas within the bladder, presumably from recent
catheterization.
IMPRESSION: Partially visualized periprosthetic fracture along the right femoral
stem, which extends through the lesser trochanter. See separately
dictated right femur CT for further description.

Nondisplaced right inferior pubic ramus fracture.

## 2022-06-12 MED ORDER — TORSEMIDE 20 MG PO TABS
20.0000 mg | ORAL_TABLET | Freq: Every day | ORAL | Status: DC
  Administered 2022-06-13: 20 mg via ORAL
  Filled 2022-06-12: qty 1

## 2022-06-12 NOTE — Progress Notes (Signed)
PROGRESS NOTE        PATIENT DETAILS Name: Kyle Farley Age: 82 y.o. Sex: male Date of Birth: [birth year] Admit Date: 06/08/2022 Admitting Physician [name], MD PCP:[name], [name], MD  Brief Summary: Patient is a 82 y.o.  male history of PAF on Eliquis, chronic HFpEF, COPD-on 2 L of oxygen at home mostly nocturnally, EtOH use/tobacco use-who was transferred from [facility] health for evaluation of sepsis secondary to complicated UTI/pyelonephritis in the setting of right UVJ stone.  Significant events: 5/21>> transferred to [facility] from [place] [facility] 5/22>> exertional dyspnea-x-ray with interstitial edema-IV Lasix.  IVF to [facility] 5/23>> mechanical fall-right eyebrow laceration-sutured.  Significant studies: 5/20>> CT abdomen/pelvis: Moderate right hydronephrosis/hydroureter-6 mm right ureterovesical junction calculus. 5/21>> renal ultrasound: No hydronephrosis. 5/22>> x-ray abdomen: 7 mm rounded calcification overlying the sacrococcygeal junction-Per urology-prior calculi now in the bladder.  Significant microbiology data: 5/19>> blood culture (done at [facility]): E. coli (spoke with microbiology lab at [facility] 5/23)  Procedures: None  Consults: Urology  Subjective: No major issues overnight-lying comfortably in bed-shortness of breath is completely resolved.  No abdominal pain.  Inquiring about discharge plans.  Objective: Vitals: Blood pressure 96/73, pulse 94, temperature 97.6 F (36.4 C), temperature source Oral, resp. rate (!) 21, weight 91 kg, SpO2 100 %.   Exam: Gen Exam:Alert awake-not in any distress HEENT:atraumatic, normocephalic Chest: B/L clear to auscultation anteriorly CVS:S1S2 regular Abdomen:soft non tender, non distended Extremities:no edema Neurology: Non focal Skin: no rash  Pertinent Labs/Radiology:    Latest Ref Rng & Units 06/12/2022    2:51 AM 06/11/2022    3:53 AM 06/10/2022    3:52 AM  CBC  WBC 4.0 - 10.5 K/uL  10.3  8.9  10.9   Hemoglobin 13.0 - 17.0 g/dL 16.1  09.6  9.4   Hematocrit 39.0 - 52.0 % 32.9  32.9  29.9   Platelets 150 - 400 K/uL 292  288  244     Lab Results  Component Value Date   NA 130 (L) 06/12/2022   K 3.8 06/12/2022   CL 88 (L) 06/12/2022   CO2 27 06/12/2022      Assessment/Plan: Sepsis due to complicated UTI and E. coli bacteremia in the setting of 6 mm right UVJ stone Sepsis physiology has resolved Per urology-stone in right UVJ has passed into the bladder Continue IV Rocephin-will plan to transition to oral antibiotics in the next day or so. Urology not planning on any cystoscopy/stent placement.  AKI Likely hemodynamically mediated Continues to improve with supportive care.  Hyponatremia Improving with supportive care Mild and asymptomatic.  Acute on chronic chronic HFpEF Developed exertional dyspnea-CXR on 5/22 showed pulm edema IV Lasix given 5/22 with significant improvement Continue oral diuretics-volume status has now stabilized.  PAF Brief run of A-fib with RVR this morning Will resume low-dose beta-blocker and amiodarone today. Remains on Eliquis.  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  EtOH use No withdrawal symptoms currently Ativan per CIWA protocol  Obesity: Estimated body mass index is 29.64 kg/m as calculated from the following:   Height as of 06/03/22: 5\' 9"  (1.753 m).   Weight as of this encounter: 91 kg.   Code status:   Code Status: DNR   DVT Prophylaxis: apixaban (ELIQUIS) tablet 2.5 mg Start: 06/10/22 1215 SCDs Start:  06/08/22 1955 apixaban (ELIQUIS) tablet 2.5 mg    Family Communication: Son-[name] updated over the phone on 5/23   Disposition Plan: Status is: Inpatient Remains inpatient appropriate because: Severity of illness   Planned Discharge Destination:Home   Diet: Diet Order             Diet heart healthy/carb modified Room service appropriate? Yes; Fluid consistency: Thin  Diet effective now                     Antimicrobial agents: Anti-infectives (From admission, onward)    Start     Dose/Rate Route Frequency Ordered Stop   06/08/22 2100  cefTRIAXone (ROCEPHIN) 2 g in sodium chloride 0.9 % 100 mL IVPB        2 g 200 mL/hr over 30 Minutes Intravenous Every 24 hours 06/08/22 1959 06/15/22 2059        MEDICATIONS: Scheduled Meds:  amiodarone  200 mg Oral BID   apixaban  2.5 mg Oral BID   arformoterol  15 mcg Nebulization BID   atorvastatin  40 mg Oral Daily   budesonide (PULMICORT) nebulizer solution  0.25 mg Nebulization BID   folic acid  1 mg Oral Daily   insulin aspart  0-5 Units Subcutaneous QHS   insulin aspart  0-6 Units Subcutaneous TID WC   levothyroxine  50 mcg Oral Q0600   LORazepam  0-4 mg Intravenous Q8H   metoprolol succinate  12.5 mg Oral Daily   multivitamin with minerals  1 tablet Oral Daily   pantoprazole  40 mg Oral Daily   revefenacin  175 mcg Nebulization Daily   sodium chloride flush  3 mL Intravenous Q12H   tamsulosin  0.4 mg Oral Daily   thiamine  100 mg Oral Daily   Or   thiamine  100 mg Intravenous Daily   [START ON 06/13/2022] torsemide  20 mg Oral Daily   Continuous Infusions:  cefTRIAXone (ROCEPHIN)  IV 2 g (06/11/22 2216)   PRN Meds:.acetaminophen **OR** acetaminophen, benzonatate, ipratropium-albuterol, melatonin, oxyCODONE, prochlorperazine   I have personally reviewed following labs and imaging studies  LABORATORY DATA: CBC: Recent Labs  Lab 06/08/22 2012 06/09/22 0658 06/10/22 0352 06/11/22 0353  06/12/22 0251  WBC 21.5* 16.5* 10.9* 8.9 10.3  HGB 9.5* 9.5* 9.4* 10.5* 10.6*  HCT 30.0* 29.5* 29.9* 32.9* 32.9*  MCV 86.0 87.3 86.9 84.8 84.1  PLT 260 243 244 288 292     Basic Metabolic Panel: Recent Labs  Lab 06/08/22 2012 06/09/22 0658 06/10/22 0352 06/11/22 0353 06/12/22 0251  NA 125* 125* 127* 127* 130*  K 5.1 4.6 4.2 4.1 3.8  CL 95* 97* 94* 89* 88*  CO2 20* 20* 22 26 27   GLUCOSE 88 86 122* 134* 142*  BUN 39* 39* 36* 34* 42*  CREATININE 2.34* 2.08* 1.79* 1.74* 1.82*  CALCIUM 8.5* 8.2* 8.4* 8.7* 8.7*  MG 2.1  --  2.0  --   --      GFR: Estimated Creatinine Clearance: 36.1 mL/min (A) (by C-G formula based on SCr of 1.82 mg/dL (H)).  Liver Function Tests: Recent Labs  Lab 06/10/22 0352  AST 50*  ALT 36  ALKPHOS 64  BILITOT 0.6  PROT 6.3*  ALBUMIN 2.5*    No results for input(s): "LIPASE", "AMYLASE" in the last 168 hours. No results for input(s): "AMMONIA" in the last 168 hours.  Coagulation Profile: No results for input(s): "INR", "PROTIME" in the last 168 hours.  Cardiac Enzymes: No results for input(s): "CKTOTAL", "CKMB", "CKMBINDEX", "TROPONINI" in the last 168 hours.  BNP (last 3 results) Recent Labs    12/22/21 1149 01/20/22 0000 01/28/22 1059  PROBNP 639* CANCELED 379     Lipid Profile: No results for input(s): "CHOL", "HDL", "LDLCALC", "TRIG", "CHOLHDL", "LDLDIRECT" in the last 72 hours.  Thyroid Function Tests: No results for input(s): "TSH", "T4TOTAL", "FREET4", "T3FREE", "THYROIDAB" in the last 72 hours.   Anemia Panel: No results for input(s): "VITAMINB12", "FOLATE", "FERRITIN", "TIBC", "IRON", "RETICCTPCT" in the last 72 hours.  Urine analysis:    Component Value Date/Time   COLORURINE YELLOW 06/09/2022 0901   APPEARANCEUR CLEAR 06/09/2022 0901   LABSPEC 1.011 06/09/2022 0901   PHURINE 6.0 06/09/2022 0901   GLUCOSEU 50 (A) 06/09/2022 0901   HGBUR LARGE (A) 06/09/2022 0901   BILIRUBINUR NEGATIVE 06/09/2022 0901    KETONESUR NEGATIVE 06/09/2022 0901   PROTEINUR 30 (A) 06/09/2022 0901   NITRITE NEGATIVE 06/09/2022 0901   LEUKOCYTESUR MODERATE (A) 06/09/2022 0901    Sepsis Labs: Lactic Acid, Venous    Component Value Date/Time   LATICACIDVEN 1.2 06/08/2022 2012    MICROBIOLOGY: No results found for this or any previous visit (from the past 240 hour(s)).  RADIOLOGY STUDIES/RESULTS: No results found.   LOS: 4 days   Jeoffrey Massed, MD  [facility]    To contact the attending provider between 7A-7P or the covering provider during after hours 7P-7A, please log into the web site [email or web address] and access using universal [place] password for that web site. If you do not have the password, please call the hospital operator.  06/12/2022, 11:30 AM

## 2022-06-13 LAB — BASIC METABOLIC PANEL
Anion gap: 11 (ref 5–15)
BUN: 39 mg/dL — ABNORMAL HIGH (ref 8–23)
CO2: 26 mmol/L (ref 22–32)
Calcium: 8.6 mg/dL — ABNORMAL LOW (ref 8.9–10.3)
Chloride: 92 mmol/L — ABNORMAL LOW (ref 98–111)
Creatinine, Ser: 1.47 mg/dL — ABNORMAL HIGH (ref 0.61–1.24)
GFR, Estimated: 48 mL/min — ABNORMAL LOW (ref >60)
Glucose, Bld: 115 mg/dL — ABNORMAL HIGH (ref 70–99)
Potassium: 3.9 mmol/L (ref 3.5–5.1)
Sodium: 129 mmol/L — ABNORMAL LOW (ref 135–145)

## 2022-06-13 LAB — GLUCOSE, CAPILLARY: Glucose-Capillary: 209 mg/dL — ABNORMAL HIGH (ref 70–99)

## 2022-06-13 MED ORDER — LEVOFLOXACIN 250 MG PO TABS
250.0000 mg | ORAL_TABLET | ORAL | Status: DC
  Filled 2022-06-13: qty 1

## 2022-06-13 MED ORDER — LEVOFLOXACIN 250 MG PO TABS: 250.0000 mg | ORAL_TABLET | ORAL | 0 refills | Status: AC

## 2022-06-13 NOTE — Progress Notes (Signed)
Patient is discharging home with his son. Patient is on 02 at home prior to admission. Patient denies questions or complaints at this time. Patient knows to f/u  in two to three days to get stitches removed from righ forehead. Discharge teaching completed.

## 2022-06-13 NOTE — Discharge Summary (Signed)
PATIENT DETAILS Name: Kyle Farley Age: 82 y.o. Sex: male Date of Birth: 11-19-1940 MRN: [id number]. Admitting Physician: [name], MD PCP:[name], [name], MD  Admit Date: 06/08/2022 Discharge date: 06/13/2022  Recommendations for Outpatient Follow-up:  Follow up with PCP in 1-2 weeks Please obtain CMP/CBC in one week  Admitted From:  Home  Disposition: Home health   Discharge Condition: good  CODE STATUS:   Code Status: DNR   Diet recommendation:  Diet Order             Diet - low sodium heart healthy           Diet Carb Modified           Diet heart healthy/carb modified Room service appropriate? Yes; Fluid consistency: Thin  Diet effective now                    Brief Summary: Patient is a 82 y.o.  male history of PAF on Eliquis, chronic HFpEF, COPD-on 2 L of oxygen at home mostly nocturnally, EtOH use/tobacco use-who was transferred from [facility] for evaluation of sepsis secondary to complicated UTI/pyelonephritis in the setting of right UVJ stone.   Significant events: 5/21>> transferred to [facility] from [facility] 5/22>> exertional dyspnea-x-ray with interstitial edema-IV Lasix.  IVF to [facility] 5/23>> mechanical fall-right eyebrow laceration-sutured.   Significant studies: 5/20>> CT abdomen/pelvis: Moderate right hydronephrosis/hydroureter-6 mm right ureterovesical junction calculus. 5/21>> renal ultrasound: No hydronephrosis. 5/22>> x-ray abdomen: 7 mm rounded calcification overlying the sacrococcygeal junction-Per urology-prior calculi now in the bladder.   Significant microbiology data: 5/19>> blood culture (done at [facility]): E. coli (spoke with microbiology lab at [facility] 5/23)   Procedures: None   Consults: Urology   Brief Hospital Course: Sepsis due to complicated UTI and E. coli bacteremia in the setting of 6 mm right UVJ stone Sepsis physiology has resolved Per urology-stone in right UVJ has passed into the  bladder Treated with IV Rocephin-with complete improvement of sepsis physiology-will be transition to oral antibiotics and discharged home today.  Suspect requires at least 10 days of total antibiotic duration.   Urology not planning on any cystoscopy/stent placement.   AKI Likely hemodynamically mediated Continues to improve with supportive care.   Hyponatremia Improving with supportive care Mild and asymptomatic. Stable for repeat chemistry panel in 1 week at PCPs office.   Acute on chronic chronic HFpEF Developed exertional dyspnea-CXR on 5/22 showed pulm edema IV Lasix given 5/22 with significant improvement Continue oral diuretics-volume status has now stabilized.   PAF Stable Continue beta-blocker/amiodarone and Eliquis.     Mechanical fall/right eyebrow laceration Repaired by night coverage 5/23 Tdap x 1 given Patient instructed to get sutures removed in the next 4-5 days at PCPs office.   DM-2 (A1c 6.5 on 5/9) CBGs relatively stable on SSI while in patient Resume Jardiance on discharge.  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   EtOH use No withdrawal symptoms currently Ativan per CIWA protocol   Obesity: Estimated body mass index is 29.64 kg/m as calculated from the following:   Height as of 06/03/22: 5\' 9"  (1.753 m).   Weight as of this encounter: 91 kg.   RN pressure injury documentation: Pressure Injury 09/16/20 Buttocks Left;Medial Stage 2 -  Partial thickness loss of dermis presenting as a shallow open injury with a red, pink wound bed  without slough. (Active)  09/16/20 1035  Location: Buttocks  Location Orientation: Left;Medial  Staging: Stage 2 -  Partial thickness loss of dermis presenting as a shallow open injury with a red, pink wound bed without slough.  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)   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 (HCC)   Alcohol abuse with alcohol-induced mood disorder (HCC)   Chronic respiratory failure with hypoxia (HCC)   Right ureteral stone   AKI (acute kidney injury) (HCC)   Type 2 diabetes mellitus (HCC)   Discharge Instructions:  Activity:  As tolerated with Full fall precautions use walker/cane & assistance as needed   Discharge Instructions     Call MD for:  difficulty breathing, headache or visual disturbances   Complete by: As directed    Diet - low sodium heart healthy   Complete by: As directed    Diet Carb Modified   Complete by: As directed    Discharge instructions   Complete by: As directed    Follow with Primary MD  [name], Kirsten, MD in 1-2 weeks  Please get a complete blood count and chemistry panel checked by your Primary MD at your next visit, and again as instructed by your Primary MD.  Get Medicines reviewed and adjusted: Please take all your medications with you for your next visit with your Primary MD  Laboratory/radiological data: Please request your Primary MD to go over all hospital tests and procedure/radiological results at the follow up, please ask your Primary MD to get all Hospital records sent to his/her office.  In some cases, they will be blood work, cultures and biopsy results pending at the time of your discharge. Please request that your primary care M.D. follows up on these results.  Also Note the following: If you experience worsening of your admission symptoms, develop shortness of breath, life threatening emergency, suicidal or homicidal thoughts  you must seek medical attention immediately by calling 911 or calling your MD immediately  if symptoms less severe.  You must read complete instructions/literature along with all the possible adverse reactions/side effects for all the Medicines you take and that have been prescribed to you. Take any new Medicines after you have completely understood and accpet all the possible adverse reactions/side effects.   Do not drive when taking Pain medications or sleeping medications (Benzodaizepines)  Do not take more than prescribed Pain, Sleep and Anxiety Medications. It is not advisable to combine anxiety,sleep and pain medications without talking with your primary care practitioner  Special Instructions: If you have smoked or chewed Tobacco  in the last 2 yrs please stop smoking, stop any regular Alcohol  and or any Recreational drug use.  Wear Seat belts while driving.  Please note: You were cared for by a hospitalist during your hospital stay. Once you are discharged, your primary care physician will handle any further medical issues. Please note that NO REFILLS for any discharge medications will be authorized once you are discharged, as it is imperative that you return to your primary care physician (or establish a relationship with a primary care physician if you do not have one) for your post hospital discharge needs  so that they can reassess your need for medications and monitor your lab values.   Discharge wound care:   Complete by: As directed    Go to your primary care practitioner's office to remove sutures-7 days from 5/23   Increase activity slowly   Complete by: As directed       Allergies as of 06/13/2022   No Known Allergies      Medication List     TAKE these medications    albuterol 108 (90 Base) MCG/ACT inhaler Commonly known as: VENTOLIN HFA Inhale 2 puffs into the lungs every 4 (four) hours as needed for wheezing or shortness of breath.   amiodarone 200 MG  tablet Commonly known as: PACERONE Take 200 mg by mouth 2 (two) times daily.   apixaban 5 MG Tabs tablet Commonly known as: Eliquis Take 1 tablet (5 mg total) by mouth 2 (two) times daily.   atorvastatin 40 MG tablet Commonly known as: LIPITOR Take 1 tablet (40 mg total) by mouth daily.   ipratropium-albuterol 0.5-2.5 (3) MG/3ML Soln Commonly known as: DUONEB Inhale 3 mLs into the lungs every 6 (six) hours as needed. What changed: reasons to take this   Jardiance 10 MG Tabs tablet Generic drug: empagliflozin Take 1 tablet (10 mg total) by mouth daily.   levofloxacin 250 MG tablet Commonly known as: LEVAQUIN Take 1 tablet (250 mg total) by mouth daily for 5 days.   levothyroxine 50 MCG tablet Commonly known as: SYNTHROID Take 50 mcg by mouth daily before breakfast.   metoprolol succinate 50 MG 24 hr tablet Commonly known as: TOPROL-XL Take 12.5 mg by mouth daily. Take with or immediately following a meal.   montelukast 10 MG tablet Commonly known as: SINGULAIR Take 1 tablet (10 mg total) by mouth daily at 12 noon.   OXYGEN Inhale 3 L into the lungs as needed (shortness of breath).   pantoprazole 40 MG tablet Commonly known as: PROTONIX Take 1 tablet (40 mg total) by mouth daily.   spironolactone 25 MG tablet Commonly known as: ALDACTONE Take 1 tablet (25 mg total) by mouth daily.   tamsulosin 0.4 MG Caps capsule Commonly known as: FLOMAX Take 1 capsule (0.4 mg total) by mouth daily.   torsemide 20 MG tablet Commonly known as: DEMADEX Take 20 mg by mouth 2 (two) times daily.   Trelegy Ellipta 200-62.5-25 MCG/ACT Aepb Generic drug: Fluticasone-Umeclidin-Vilant Inhale 2 puffs into the lungs daily.   ZZZQUIL PO Take 1 tablet by mouth at bedtime.               Discharge Care Instructions  (From admission, onward)           Start     Ordered   06/13/22 0000  Discharge wound care:       Comments: Go to your primary care practitioner's office to  remove sutures-7 days from 5/23   06/13/22 1610            Follow-up Information     [name], Kirsten, MD. Schedule an appointment as soon as possible for a visit in 1 week(s).   Specialty: Family Medicine Contact information: [address] [place] [address] [phone]                No Known Allergies   Other Procedures/Studies: CT HEAD WO CONTRAST ( )  Result Date: 06/09/2022 CLINICAL DATA:  Fall EXAM: CT HEAD WITHOUT CONTRAST TECHNIQUE: Contiguous axial images were obtained from the base of  the skull through the vertex without intravenous contrast. RADIATION DOSE REDUCTION: This exam was performed according to the departmental dose-optimization program which includes automated exposure control, adjustment of the mA and/or kV according to patient size and/or use of iterative reconstruction technique. COMPARISON:  05/10/2022 FINDINGS: Brain: There is no mass, hemorrhage or extra-axial collection. The size and configuration of the ventricles and extra-axial CSF spaces are normal. There is hypoattenuation of the white matter, most commonly indicating chronic small vessel disease. Vascular: No abnormal hyperdensity of the major intracranial arteries or dural venous sinuses. No intracranial atherosclerosis. Skull: The visualized skull base, calvarium and extracranial soft tissues are normal. Sinuses/Orbits: Right mastoid and middle ear effusion, unchanged. The orbits are normal. IMPRESSION: 1. No acute intracranial abnormality. 2. Right mastoid and middle ear effusion, unchanged. Electronically Signed   By: Deatra Robinson M.D.   On: 06/09/2022 22:31   DG Chest Port 1 View  Result Date: 06/09/2022 CLINICAL DATA:  Short of breath, chest tightness EXAM: PORTABLE CHEST 1 VIEW COMPARISON:  06/07/2022 FINDINGS: Single frontal view of the chest demonstrates a stable cardiac silhouette. Increasing interstitial prominence throughout the lungs, with persistent background vascular  congestion, compatible with fluid overload and developing interstitial edema. No effusion or pneumothorax. IMPRESSION: 1. Worsening volume status, with developing interstitial edema. Electronically Signed   By: Sharlet Salina M.D.   On: 06/09/2022 19:49   DG Abd Portable 1V  Result Date: 06/09/2022 CLINICAL DATA:  Ureteral calculus EXAM: PORTABLE ABDOMEN - 1 VIEW COMPARISON:  06/08/2022 FINDINGS: Two supine frontal views of the abdomen and pelvis are obtained. Bowel gas pattern is unremarkable without obstruction or ileus. Calcifications are seen within the lower pelvis consistent with vascular and prostate calcifications on recent CT. There is a rounded 7 mm calcification overlying the sacrococcygeal junction, which could reflect the right UVJ calculus seen on recent abdominal CT. Lung bases are clear. IMPRESSION: 1. 7 mm rounded calcification overlying the sacrococcygeal junction, which may reflect the right UVJ calculus seen on previous CT. It is unclear whether this is still within the right UVJ or has progressed to the bladder lumen. Electronically Signed   By: Sharlet Salina M.D.   On: 06/09/2022 16:20   US RENAL  Result Date: 06/08/2022 CLINICAL DATA:  Sepsis secondary to UTI. EXAM: RENAL / URINARY TRACT ULTRASOUND COMPLETE COMPARISON:  CT chest abdomen and pelvis 06/07/2022 FINDINGS: Right Kidney: Renal measurements: 10.3 x 5.8 x 6.1 cm = volume: 227 mL. Echogenicity within normal limits. No mass or hydronephrosis visualized. Left Kidney: Renal measurements: 10.5 x 5.7 x 4.2 cm = volume: 130 mL. Echogenicity within normal limits. No mass or hydronephrosis visualized. Bladder: Within the inferior aspect of the bladder there is nonvascular hyperdensity measuring 3.1 x 1.2 by 2.0 cm. Other: None. IMPRESSION: 1. No hydronephrosis. 2. Nonvascular hyperdensity within the inferior aspect of the bladder measuring 3.1 x 1.2 x 2.0 cm. This may represent debris or blood products. Electronically Signed   By: Darliss Cheney M.D.   On: 06/08/2022 23:25   DG Abd Portable 1V  Result Date: 06/08/2022 CLINICAL DATA:  Sepsis secondary to UTI. Obstruction of right ureteropelvic junction due to stone EXAM: PORTABLE ABDOMEN - 1 VIEW COMPARISON:  CT 06/07/2022 FINDINGS: Best possible image given patient positioning and body habitus. Single frontal view of the upper abdomen demonstrates multiple loops of gas-filled large and small. No radiopaque calculi. The UVJs were unable to be included in the image at the patient is sitting upright in a chair.  Thoracolumbar spondylosis. IMPRESSION: Best possible image given patient positioning and body habitus. The UVJs were unable to be included in the image at the patient is sitting upright. Gaseous distention of the small and large bowel. Electronically Signed   By: Minerva Fester M.D.   On: 06/08/2022 21:13     TODAY-DAY OF DISCHARGE:  Subjective:   Kyle Farley today has no headache,no chest abdominal pain,no new weakness tingling or numbness, feels much better wants to go home today.   Objective:   Blood pressure 130/63, pulse 64, temperature 97.7 F (36.5 C), temperature source Oral, resp. rate 19, weight 91.3 kg, SpO2 97 %.  Intake/Output Summary (Last 24 hours) at 06/13/2022 0938 Last data filed at 06/13/2022 1610 Gross per 24 hour  Intake 1060 ml  Output 2800 ml  Net -1740 ml   Filed Weights   06/10/22 0300 06/11/22 0600 06/13/22 0500  Weight: 99.7 kg 91 kg 91.3 kg    Exam: Awake Alert, Oriented *3, No new F.N deficits, Normal affect [place].AT,PERRAL Supple Neck,No JVD, No cervical lymphadenopathy appriciated.  Symmetrical Chest wall movement, Good air movement bilaterally, CTAB RRR,No Gallops,Rubs or new Murmurs, No Parasternal Heave +ve B.Sounds, Abd Soft, Non tender, No organomegaly appriciated, No rebound -guarding or rigidity. No Cyanosis, Clubbing or edema, No new Rash or bruise   PERTINENT RADIOLOGIC STUDIES: No results found.   PERTINENT LAB  RESULTS: CBC: Recent Labs    06/11/22 0353 06/12/22 0251  WBC 8.9 10.3  HGB 10.5* 10.6*  HCT 32.9* 32.9*  PLT 288 292   CMET CMP     Component Value Date/Time   NA 129 (L) 06/13/2022 0405   NA 137 05/27/2022 1643   K 3.9 06/13/2022 0405   CL 92 (L) 06/13/2022 0405   CO2 26 06/13/2022 0405   GLUCOSE 115 (H) 06/13/2022 0405   BUN 39 (H) 06/13/2022 0405   BUN 15 05/27/2022 1643   CREATININE 1.47 (H) 06/13/2022 0405   CALCIUM 8.6 (L) 06/13/2022 0405   PROT 6.3 (L) 06/10/2022 0352   PROT 6.8 05/27/2022 1643   ALBUMIN 2.5 (L) 06/10/2022 0352   ALBUMIN 4.0 05/27/2022 1643   AST 50 (H) 06/10/2022 0352   ALT 36 06/10/2022 0352   ALKPHOS 64 06/10/2022 0352   BILITOT 0.6 06/10/2022 0352   BILITOT 0.3 05/27/2022 1643   GFRNONAA 48 (L) 06/13/2022 0405   GFRAA 95 03/26/2019 1157    GFR Estimated Creatinine Clearance: 44.7 mL/min (A) (by C-G formula based on SCr of 1.47 mg/dL (H)). No results for input(s): "LIPASE", "AMYLASE" in the last 72 hours. No results for input(s): "CKTOTAL", "CKMB", "CKMBINDEX", "TROPONINI" in the last 72 hours. Invalid input(s): "POCBNP" No results for input(s): "DDIMER" in the last 72 hours. No results for input(s): "HGBA1C" in the last 72 hours. No results for input(s): "CHOL", "HDL", "LDLCALC", "TRIG", "CHOLHDL", "LDLDIRECT" in the last 72 hours. No results for input(s): "TSH", "T4TOTAL", "T3FREE", "THYROIDAB" in the last 72 hours.  Invalid input(s): "FREET3" No results for input(s): "VITAMINB12", "FOLATE", "FERRITIN", "TIBC", "IRON", "RETICCTPCT" in the last 72 hours. Coags: No results for input(s): "INR" in the last 72 hours.  Invalid input(s): "PT" Microbiology: No results found for this or any previous visit (from the past 240 hour(s)).  FURTHER DISCHARGE INSTRUCTIONS:  Get Medicines reviewed and adjusted: Please take all your medications with you for your next visit with your Primary MD  Laboratory/radiological data: Please request  your Primary MD to go over all hospital tests and procedure/radiological results at  the follow up, please ask your Primary MD to get all Hospital records sent to his/her office.  In some cases, they will be blood work, cultures and biopsy results pending at the time of your discharge. Please request that your primary care M.D. goes through all the records of your hospital data and follows up on these results.  Also Note the following: If you experience worsening of your admission symptoms, develop shortness of breath, life threatening emergency, suicidal or homicidal thoughts you must seek medical attention immediately by calling 911 or calling your MD immediately  if symptoms less severe.  You must read complete instructions/literature along with all the possible adverse reactions/side effects for all the Medicines you take and that have been prescribed to you. Take any new Medicines after you have completely understood and accpet all the possible adverse reactions/side effects.   Do not drive when taking Pain medications or sleeping medications (Benzodaizepines)  Do not take more than prescribed Pain, Sleep and Anxiety Medications. It is not advisable to combine anxiety,sleep and pain medications without talking with your primary care practitioner  Special Instructions: If you have smoked or chewed Tobacco  in the last 2 yrs please stop smoking, stop any regular Alcohol  and or any Recreational drug use.  Wear Seat belts while driving.  Please note: You were cared for by a hospitalist during your hospital stay. Once you are discharged, your primary care physician will handle any further medical issues. Please note that NO REFILLS for any discharge medications will be authorized once you are discharged, as it is imperative that you return to your primary care physician (or establish a relationship with a primary care physician if you do not have one) for your post hospital discharge needs so that  they can reassess your need for medications and monitor your lab values.  Total Time spent coordinating discharge including counseling, education and face to face time equals greater than 30 minutes.  SignedJeoffrey Massed 06/13/2022 9:38 AM

## 2022-06-15 ENCOUNTER — Other Ambulatory Visit: Payer: Self-pay

## 2022-06-15 ENCOUNTER — Telehealth: Payer: Self-pay

## 2022-06-15 ENCOUNTER — Encounter: Payer: Self-pay | Admitting: Physician Assistant

## 2022-06-15 ENCOUNTER — Ambulatory Visit (INDEPENDENT_AMBULATORY_CARE_PROVIDER_SITE_OTHER): Payer: Medicare HMO | Admitting: Physician Assistant

## 2022-06-15 VITALS — BP 120/60 | HR 80 | Temp 98.7°F | Resp 14 | Ht 69.0 in | Wt 203.0 lb

## 2022-06-15 DIAGNOSIS — Z4802 Encounter for removal of sutures: Secondary | ICD-10-CM

## 2022-06-15 DIAGNOSIS — A419 Sepsis, unspecified organism: Secondary | ICD-10-CM | POA: Diagnosis not present

## 2022-06-15 DIAGNOSIS — S01111A Laceration without foreign body of right eyelid and periocular area, initial encounter: Secondary | ICD-10-CM

## 2022-06-15 DIAGNOSIS — E871 Hypo-osmolality and hyponatremia: Secondary | ICD-10-CM

## 2022-06-15 DIAGNOSIS — N390 Urinary tract infection, site not specified: Secondary | ICD-10-CM

## 2022-06-15 HISTORY — DX: Encounter for removal of sutures: Z48.02

## 2022-06-15 MED ORDER — TRELEGY ELLIPTA 200-62.5-25 MCG/ACT IN AEPB: 2.0000 | INHALATION_SPRAY | Freq: Every day | RESPIRATORY_TRACT | 1 refills | Status: DC

## 2022-06-15 NOTE — Assessment & Plan Note (Signed)
Getting labs drawn next week to assess and adjust treatment as needed.

## 2022-06-15 NOTE — Assessment & Plan Note (Signed)
Patient presents for suture removal. The wound is well healed without signs of infection.  The sutures are removed with 7 counted and discarded. Wound care and activity instructions given. Return prn.

## 2022-06-15 NOTE — Progress Notes (Signed)
Subjective:  Patient ID: Kyle Farley, male    DOB: November 11, 1940  Age: 82 y.o. MRN: [id number]  Chief Complaint  Patient presents with   Blood Infection   Hospitalization Follow-up   Transitions Of Care   Suture / Staple Removal    HPI Patient was seen at [facility]. He was admitted on 06/08/2022 and discharged on 06/13/2022. 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  He fell at the hospital on 06/10/2022. He received 7 stitches on right side of the forehead. He came today for stitches removal because he is going to the [facility]. Patient denies any abnormal drainage, increased drainage, fever, chills.   Patient was admitted with a large kidney stone causing obstruction that lead to sepsis. Patient was admitted and treated for the bacterial sepsis. The stone had since moved to the bladder. Asked the patient if he has passed it, but patient states he has not seen it and that they told him it had "disappeared." They discharged him with levofloxacin 250 mg Daily for 5 days. He is still taking it.     05/27/2022    4:12 PM 04/02/2022   11:24 AM 04/02/2022   10:41 AM 02/16/2022    2:56 PM 11/10/2021    2:16 PM  Depression screen PHQ 2/9  Decreased Interest 0 0 0 0 0  Down, Depressed, Hopeless 0 0 0 0 0  PHQ - 2 Score 0 0 0 0 0  Altered sleeping 0    0  Tired, decreased energy 2    0  Change in appetite 2    0  Feeling bad or failure about yourself  0    0  Trouble concentrating 0    0  Moving slowly or fidgety/restless 3    0  Suicidal thoughts 0    0  PHQ-9 Score 7    0  Difficult doing work/chores Somewhat difficult    Not difficult at all        06/16/2022   10:32 AM  Fall Risk   Falls in the past year? 1  Number falls in past yr: 0  Injury with Fall? 1  Risk for fall due to : History of  fall(s);Other (Comment)  Risk for fall due to: Comment Inpatient at the hospital and had a fall when it was dark in the room. Had to get stitches  Follow up Falls evaluation completed;Education provided;Falls prevention discussed;Follow up appointment    Patient Care Team: Blane Ohara, MD as PCP - General (Family Medicine) Regan Lemming, MD as Consulting Physician (Cardiology) Marlowe Sax, RN as Case Manager (General Practice) Baldo Daub, MD as Consulting Physician (Cardiology)   Review of Systems  Constitutional:  Negative for chills, fatigue and fever.  HENT:  Negative for congestion, sinus pressure, sinus pain and sore throat.   Respiratory:  Positive for shortness of breath (COPD). Negative for cough.   Cardiovascular:  Negative for chest pain and palpitations.  Gastrointestinal:  Negative for abdominal pain, constipation, diarrhea, nausea and vomiting.  Endocrine: Positive for polydipsia. Negative for polyphagia.  Genitourinary:  Negative for dysuria and frequency.  Skin:  Positive for wound (face and left arm).  Neurological:  Negative for dizziness, light-headedness and headaches.    Current Outpatient Medications on File Prior to Visit  Medication Sig Dispense Refill   albuterol (  VENTOLIN HFA) 108 (90 Base) MCG/ACT inhaler Inhale 2 puffs into the lungs every 4 (four) hours as needed for wheezing or shortness of breath. 48 each 2   amiodarone (PACERONE) 200 MG tablet Take 200 mg by mouth 2 (two) times daily.     apixaban (ELIQUIS) 5 MG TABS tablet Take 1 tablet (5 mg total) by mouth 2 (two) times daily. 180 tablet 3   atorvastatin (LIPITOR) 40 MG tablet Take 1 tablet (40 mg total) by mouth daily. 90 tablet 3   diphenhydrAMINE HCl, Sleep, (ZZZQUIL PO) Take 1 tablet by mouth at bedtime.     ipratropium-albuterol (DUONEB) 0.5-2.5 (3) MG/3ML SOLN Inhale 3 mLs into the lungs every 6 (six) hours as needed. (Patient taking differently: Inhale 3 mLs into the lungs every 6  (six) hours as needed (For shortness of breath).) 360 mL 1   levothyroxine (SYNTHROID) 50 MCG tablet Take 50 mcg by mouth daily before breakfast.     metoprolol succinate (TOPROL-XL) 50 MG 24 hr tablet Take 12.5 mg by mouth daily. Take with or immediately following a meal.     montelukast (SINGULAIR) 10 MG tablet Take 1 tablet (10 mg total) by mouth daily at 12 noon. 90 tablet 3   OXYGEN Inhale 3 L into the lungs as needed (shortness of breath).     torsemide (DEMADEX) 20 MG tablet Take 20 mg by mouth 2 (two) times daily.     No current facility-administered medications on file prior to visit.   Past Medical History:  Diagnosis Date   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 (HCC) 11/10/2021   Acquired hypothyroidism 11/10/2021   Acute hypoxic respiratory failure (HCC) 04/25/2022   Acute on chronic diastolic heart failure (HCC) 04/25/2022   Acute on chronic systolic heart failure (HCC) 11/10/2021   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/10/2021   Alcohol use 09/12/2020   Anemia of chronic disease 04/25/2022   Arthritis    Atherosclerosis of native arteries of extremities with intermittent claudication, bilateral legs (HCC) 11/10/2021   Bilateral leg edema 02/16/2022   BPH (benign prostatic hyperplasia) 11/10/2021   CHF (congestive heart failure) (HCC)    Chronic hyponatremia 04/25/2022   Chronic respiratory failure with hypoxia (HCC) 05/09/2022   Cigarette smoker 10/21/2014   Class 1 obesity 04/25/2022   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)    Dyspnea    Dysrhythmia    atrial fibrillation   Encounter for prostate cancer screening 02/16/2022   Encounter for screening for lung cancer 02/16/2022   Essential hypertension 10/21/2014   Femur fracture, right (HCC) 09/12/2020   GERD (gastroesophageal reflux disease)    History of kidney stones    Hyperlipidemia 01/24/2017   Hypertension    Hyponatremia 04/26/2022   Kidney stones 01/24/2017   Paroxysmal  atrial fibrillation (HCC) 10/21/2014   Pressure injury of skin 09/16/2020   SOB (shortness of breath) 05/10/2022   Tobacco abuse 02/16/2022   Past Surgical History:  Procedure Laterality Date   LEFT HEART CATH AND CORONARY ANGIOGRAPHY N/A 09/15/2020   Procedure: LEFT HEART CATH AND CORONARY ANGIOGRAPHY;  Surgeon: Tonny Bollman, MD;  Location: [facility] INVASIVE CV LAB;  Service: Cardiovascular;  Laterality: N/A;   LEG SURGERY Right    steal pin placed in right leg 35 years ago   ORIF FEMUR FRACTURE Right 09/15/2020   Procedure: OPEN REDUCTION INTERNAL FIXATION FEMORAL SHAFT FRACTURE;  Surgeon: Roby Lofts, MD;  Location: [facility] OR;  Service: Orthopedics;  Laterality: Right;  Family History  Problem Relation Age of Onset   Cancer Mother    Diabetes Mother    Breast cancer Sister    Social History   Socioeconomic History   Marital status: Widowed    Spouse name: Not on file   Number of children: 3   Years of education: Not on file   Highest education level: High school graduate  Occupational History   Occupation: retired  Tobacco Use   Smoking status: Every Day    Packs/day: 1.00    Years: 62.00    Additional pack years: 0.00    Total pack years: 62.00    Types: Cigarettes, Cigars   Smokeless tobacco: Never  Vaping Use   Vaping Use: Never used  Substance and Sexual Activity   Alcohol use: Yes    Alcohol/week: 5.0 standard drinks of alcohol    Types: 5 Standard drinks or equivalent per week    Comment: 4-5 beers per day   Drug use: No   Sexual activity: Not Currently  Other Topics Concern   Not on file  Social History Narrative   Not on file   Social Determinants of Health   Financial Resource Strain: Low Risk  (04/26/2022)   Overall Financial Resource Strain (CARDIA)    Difficulty of Paying Living Expenses: Not very hard  Food Insecurity: No Food Insecurity (04/15/2022)   Hunger Vital Sign    Worried About Running Out of Food in the Last Year: Never true    Ran Out  of Food in the Last Year: Never true  Transportation Needs: No Transportation Needs (04/26/2022)   PRAPARE - Administrator, Civil Service (Medical): No    Lack of Transportation (Non-Medical): No  Physical Activity: Inactive (04/15/2022)   Exercise Vital Sign    Days of Exercise per Week: 0 days    Minutes of Exercise per Session: 0 min  Stress: No Stress Concern Present (04/15/2022)   [employer] of Occupational Health - Occupational Stress Questionnaire    Feeling of Stress : Not at all  Social Connections: Moderately Isolated (04/15/2022)   Social Connection and Isolation Panel [NHANES]    Frequency of Communication with Friends and Family: More than three times a week    Frequency of Social Gatherings with Friends and Family: More than three times a week    Attends Religious Services: More than 4 times per year    Active Member of [employer] or Organizations: No    Attends Banker Meetings: Never    Marital Status: Widowed    Objective:  BP 120/60   Pulse 80   Temp 98.7 F (37.1 C)   Resp 14   Ht 5\' 9"  (1.753 m)   Wt 203 lb (92.1 kg)   SpO2 95%   BMI 29.98 kg/m      06/15/2022    9:30 AM 06/13/2022    8:00 AM 06/13/2022    6:00 AM  BP/Weight  Systolic BP 120    Diastolic BP 60    Wt. (Lbs) 203    BMI 29.98 kg/m2       Information is confidential and restricted. Go to Review Flowsheets to unlock data.    Physical Exam Constitutional:      Appearance: Normal appearance. He is obese.  HENT:     Head: Laceration present.      Comments: Larey Seat in the hospital after the lights were off during the night and had to get up to go to  the bathroom.  Eyes:     Conjunctiva/sclera: Conjunctivae normal.  Cardiovascular:     Rate and Rhythm: Normal rate and regular rhythm.  Pulmonary:     Effort: Pulmonary effort is normal.     Breath sounds: Normal breath sounds.  Musculoskeletal:     Left forearm: Laceration present.       Arms:     Comments:  Laceration received while working in the yard before being admitted to the hospital.   Neurological:     Mental Status: He is alert.     Diabetic Foot Exam - Simple   No data filed      Lab Results  Component Value Date   WBC 10.3 06/12/2022   HGB 10.6 (L) 06/12/2022   HCT 32.9 (L) 06/12/2022   PLT 292 06/12/2022   GLUCOSE 115 (H) 06/13/2022   CHOL 188 10/14/2021   TRIG 93 10/14/2021   HDL 92 10/14/2021   LDLCALC 80 10/14/2021   ALT 36 06/10/2022   AST 50 (H) 06/10/2022   NA 129 (L) 06/13/2022   K 3.9 06/13/2022   CL 92 (L) 06/13/2022   CREATININE 1.47 (H) 06/13/2022   BUN 39 (H) 06/13/2022   CO2 26 06/13/2022   TSH 3.770 05/05/2022   HGBA1C 6.5 (H) 05/27/2022      Assessment & Plan:    Sepsis secondary to UTI [facility]) Assessment & Plan: Instructed to get labs 1 week after discharge. Scheduled a nurse visit to have blood drawn next week to assess for continued infection.   Orders: -     CBC with Differential/Platelet; Future  Hyponatremia Assessment & Plan: Getting labs drawn next week to assess and adjust treatment as needed.  Orders: -     Comprehensive metabolic panel; Future  Laceration of right eyebrow, initial encounter Assessment & Plan: Healing well.  Sutures removed.    Visit for suture removal Assessment & Plan: Patient presents for suture removal. The wound is well healed without signs of infection.  The sutures are removed with 7 counted and discarded. Wound care and activity instructions given. Return prn.      No orders of the defined types were placed in this encounter.   Orders Placed This Encounter  Procedures   CBC with Differential/Platelet   Comprehensive metabolic panel     Follow-up: Return if symptoms worsen or fail to improve.   I,Marla I Leal-Borjas,acting as a scribe for [employer], PA.,have documented all relevant documentation on the behalf of [name], PA,as directed by  [name], PA while in the presence of  [name], [place].   An After Visit Summary was printed and given to the patient.  [name], [place] [name] [phone]

## 2022-06-15 NOTE — Transitions of Care (Post Inpatient/ED Visit) (Signed)
   06/15/2022  Name: RAOUL SOMERO MRN: [id number] DOB: [birth year]  Today's TOC FU Call Status: Today's TOC FU Call Status:: Unsuccessul Call (1st Attempt) Unsuccessful Call (1st Attempt) Date: 06/15/22  Attempted to reach the patient regarding the most recent Inpatient/ED visit.  Follow Up Plan: Additional outreach attempts will be made to reach the patient to complete the Transitions of Care (Post Inpatient/ED visit) call.   Alto Denver RN, MSN, CCM RN Care Manager  Chronic Care Management Direct Number: [phone]

## 2022-06-15 NOTE — Assessment & Plan Note (Signed)
Instructed to get labs 1 week after discharge. Scheduled a nurse visit to have blood drawn next week to assess for continued infection.

## 2022-06-16 ENCOUNTER — Ambulatory Visit (INDEPENDENT_AMBULATORY_CARE_PROVIDER_SITE_OTHER): Payer: Medicare HMO

## 2022-06-16 ENCOUNTER — Telehealth: Payer: Medicare HMO

## 2022-06-16 ENCOUNTER — Other Ambulatory Visit: Payer: Self-pay

## 2022-06-16 DIAGNOSIS — I5023 Acute on chronic systolic (congestive) heart failure: Secondary | ICD-10-CM

## 2022-06-16 DIAGNOSIS — I480 Paroxysmal atrial fibrillation: Secondary | ICD-10-CM

## 2022-06-16 DIAGNOSIS — J449 Chronic obstructive pulmonary disease, unspecified: Secondary | ICD-10-CM | POA: Diagnosis not present

## 2022-06-16 DIAGNOSIS — S01111A Laceration without foreign body of right eyelid and periocular area, initial encounter: Secondary | ICD-10-CM

## 2022-06-16 DIAGNOSIS — I10 Essential (primary) hypertension: Secondary | ICD-10-CM

## 2022-06-16 DIAGNOSIS — A419 Sepsis, unspecified organism: Secondary | ICD-10-CM

## 2022-06-16 MED ORDER — PANTOPRAZOLE SODIUM 40 MG PO TBEC: 40.0000 mg | DELAYED_RELEASE_TABLET | Freq: Every day | ORAL | 0 refills | Status: DC

## 2022-06-16 MED ORDER — TAMSULOSIN HCL 0.4 MG PO CAPS: 0.4000 mg | ORAL_CAPSULE | Freq: Every day | ORAL | 0 refills | Status: DC

## 2022-06-16 MED ORDER — SPIRONOLACTONE 25 MG PO TABS: 25.0000 mg | ORAL_TABLET | Freq: Every day | ORAL | 0 refills | Status: DC

## 2022-06-16 MED ORDER — EMPAGLIFLOZIN 10 MG PO TABS: 10.0000 mg | ORAL_TABLET | Freq: Every day | ORAL | 1 refills | Status: DC

## 2022-06-16 NOTE — Transitions of Care (Post Inpatient/ED Visit) (Cosign Needed)
06/16/2022  Name: Kyle Farley MRN: [id number] DOB: 1940-08-29  Today's TOC FU Call Status: Today's TOC FU Call Status:: Successful TOC FU Call Competed TOC FU Call Complete Date: 06/16/22  Transition Care Management Follow-up Telephone Call Date of Discharge: 06/13/22 Discharge Facility[facility] Type of Discharge: Inpatient Admission Primary Inpatient Discharge Diagnosis:: sepsis due to kidney stone, had a fall while in the hospital- had to get stitches How have you been since you were released from the hospital?: Better Any questions or concerns?: Yes Patient Questions/Concerns:: Needs refills on medications, forgot to get them refilled yesterday Patient Questions/Concerns Addressed: Provided Patient Educational Materials, Notified Provider of Patient Questions/Concerns, Other: (in basket message to pcp and staff)  Items Reviewed: Did you receive and understand the discharge instructions provided?: Yes Medications obtained,verified, and reconciled?: Yes (Medications Reviewed) Any new allergies since your discharge?: No Dietary orders reviewed?: Yes Type of Diet Ordered:: Heart Healthy Diet Do you have support at home?: Yes People in Home: child(ren), adult Name of Support/Comfort Primary Source: [name]  Medications Reviewed Today: Medications Reviewed Today     Reviewed by Marlowe Sax, RN (Case Manager) on 06/16/22 at 1035  Med List Status: <None>   Medication Order Taking? Sig Documenting Provider Last Dose Status Informant  albuterol (VENTOLIN HFA) 108 (90 Base) MCG/ACT inhaler [id number] Yes Inhale 2 puffs into the lungs every 4 (four) hours as needed for wheezing or shortness of breath. [name], Kirsten, MD Taking Active Self  amiodarone (PACERONE) 200 MG tablet [id number] Yes Take 200 mg by mouth 2 (two) times daily. [provider] Taking Active Self  apixaban (ELIQUIS) 5 MG TABS tablet [id number] Yes Take 1 tablet (5 mg total) by mouth 2 (two)  times daily. Flossie Dibble, NP Taking Active Self  atorvastatin (LIPITOR) 40 MG tablet [id number] Yes Take 1 tablet (40 mg total) by mouth daily. Gaston Islam., NP Taking Active Self  diphenhydrAMINE HCl, Sleep, (ZZZQUIL PO) [id number] Yes Take 1 tablet by mouth at bedtime. [provider] Taking Active Self  empagliflozin (JARDIANCE) 10 MG TABS tablet [id number] Yes Take 1 tablet (10 mg total) by mouth daily. Zannie Cove, MD Taking Active Self  Fluticasone-Umeclidin-Vilant [facility] ELLIPTA) 200-62.5-25 MCG/ACT AEPB [id number] Yes Inhale 2 puffs into the lungs daily. [name], Kirsten, MD Taking Active   ipratropium-albuterol (DUONEB) 0.5-2.5 (3) MG/3ML SOLN [id number] Yes Inhale 3 mLs into the lungs every 6 (six) hours as needed.  Patient taking differently: Inhale 3 mLs into the lungs every 6 (six) hours as needed (For shortness of breath).   [name], Kirsten, MD Taking Active Self  levofloxacin (LEVAQUIN) 250 MG tablet [id number] Yes Take 1 tablet (250 mg total) by mouth daily for 5 days. Maretta Bees, MD Taking Active   levothyroxine (SYNTHROID) 50 MCG tablet [id number] Yes Take 50 mcg by mouth daily before breakfast. [provider] Taking Active Self  metoprolol succinate (TOPROL-XL) 50 MG 24 hr tablet [id number] Yes Take 12.5 mg by mouth daily. Take with or immediately following a meal. [provider] Taking Active Self  montelukast (SINGULAIR) 10 MG tablet [id number] Yes Take 1 tablet (10 mg total) by mouth daily at 12 noon. Blane Ohara, MD Taking Active Self  OXYGEN [id number] Yes Inhale 3 L into the lungs as needed (shortness of breath). [provider] Taking Active Self  pantoprazole (PROTONIX) 40 MG tablet [id number] Yes Take 1 tablet (40 mg total) by mouth daily. Zannie Cove, MD Taking Active Self  spironolactone (  ALDACTONE) 25 MG tablet [id number]  Take 1 tablet (25 mg total) by mouth daily. Zannie Cove, MD  Active Self  tamsulosin [facility])  0.4 MG CAPS capsule [id number] Yes Take 1 capsule (0.4 mg total) by mouth daily. Zannie Cove, MD Taking Active Self  torsemide (DEMADEX) 20 MG tablet [id number] Yes Take 20 mg by mouth 2 (two) times daily. [provider] Taking Active Self            Home Care and Equipment/Supplies: Were Home Health Services Ordered?: No Any new equipment or medical supplies ordered?: No  Functional Questionnaire: Do you need assistance with bathing/showering or dressing?: No Do you need assistance with meal preparation?: No Do you need assistance with eating?: No Do you have difficulty maintaining continence: No Do you need assistance with getting out of bed/getting out of a chair/moving?: No Do you have difficulty managing or taking your medications?: No (the patient needs refills- will contact the provider)  Follow up appointments reviewed: PCP Follow-up appointment confirmed?: Yes Date of PCP follow-up appointment?: 06/15/22 (saw [name], PA- had his stitches removed also) Follow-up Provider: Criselda Peaches, PA [facility] Follow-up appointment confirmed?: NA Do you need transportation to your follow-up appointment?: No Do you understand care options if your condition(s) worsen?: Yes-patient verbalized understanding    Alto Denver RN, MSN, CCM RN Care Manager  Chronic Care Management Direct Number: [phone]

## 2022-06-16 NOTE — Patient Instructions (Addendum)
Please call the care guide team at [phone] if you need to cancel or reschedule your appointment.   Today's TOC FU Call Status: Today's TOC FU Call Status:: Successful TOC FU Call Competed TOC FU Call Complete Date: 06/16/22   Transition Care Management Follow-up Telephone Call Date of Discharge: 06/13/22 Discharge Facility: [facility]) Type of Discharge: Inpatient Admission Primary Inpatient Discharge Diagnosis:: sepsis due to kidney stone, had a fall while in the hospital- had to get stitches How have you been since you were released from the hospital?: Better Any questions or concerns?: Yes Patient Questions/Concerns:: Needs refills on medications, forgot to get them refilled yesterday Patient Questions/Concerns Addressed: Provided Patient Educational Materials, Notified Provider of Patient Questions/Concerns, Other: (in basket message to pcp and staff)   Items Reviewed: Did you receive and understand the discharge instructions provided?: Yes Medications obtained,verified, and reconciled?: Yes (Medications Reviewed) Any new allergies since your discharge?: No Dietary orders reviewed?: Yes Type of Diet Ordered:: Heart Healthy Diet Do you have support at home?: Yes People in Home: child(ren), adult Name of Support/Comfort Primary Source: son- Jahmari Samec   Medications Reviewed Today: Medications Reviewed Today       Reviewed by Marlowe Sax, RN (Case Manager) on 06/16/22 at 1035  Med List Status: <None>    Medication Order Taking? Sig Documenting Provider Last Dose Status Informant  albuterol (VENTOLIN HFA) 108 (90 Base) MCG/ACT inhaler [id number] Yes Inhale 2 puffs into the lungs every 4 (four) hours as needed for wheezing or shortness of breath. [name], Kirsten, MD Taking Active Self  amiodarone (PACERONE) 200 MG tablet [id number] Yes Take 200 mg by mouth 2 (two) times daily. [provider] Taking Active Self  apixaban (ELIQUIS) 5 MG TABS tablet [id number] Yes Take  1 tablet (5 mg total) by mouth 2 (two) times daily. Flossie Dibble, NP Taking Active Self  atorvastatin (LIPITOR) 40 MG tablet [id number] Yes Take 1 tablet (40 mg total) by mouth daily. Gaston Islam., NP Taking Active Self  diphenhydrAMINE HCl, Sleep, (ZZZQUIL PO) [id number] Yes Take 1 tablet by mouth at bedtime. [provider] Taking Active Self  empagliflozin (JARDIANCE) 10 MG TABS tablet [id number] Yes Take 1 tablet (10 mg total) by mouth daily. Zannie Cove, MD Taking Active Self  Fluticasone-Umeclidin-[facility] ELLIPTA) 200-62.5-25 MCG/ACT AEPB [id number] Yes Inhale 2 puffs into the lungs daily. [name], Kirsten, MD Taking Active    ipratropium-albuterol (DUONEB) 0.5-2.5 (3) MG/3ML SOLN [id number] Yes Inhale 3 mLs into the lungs every 6 (six) hours as needed.  Patient taking differently: Inhale 3 mLs into the lungs every 6 (six) hours as needed (For shortness of breath).   [name], Kirsten, MD Taking Active Self  levofloxacin (LEVAQUIN) 250 MG tablet [id number] Yes Take 1 tablet (250 mg total) by mouth daily for 5 days. Maretta Bees, MD Taking Active    levothyroxine (SYNTHROID) 50 MCG tablet [id number] Yes Take 50 mcg by mouth daily before breakfast. [provider] Taking Active Self  metoprolol succinate (TOPROL-XL) 50 MG 24 hr tablet [id number] Yes Take 12.5 mg by mouth daily. Take with or immediately following a meal. [provider] Taking Active Self  montelukast (SINGULAIR) 10 MG tablet [id number] Yes Take 1 tablet (10 mg total) by mouth daily at 12 noon. Blane Ohara, MD Taking Active Self  OXYGEN [id number] Yes Inhale 3 L into the lungs as needed (shortness of breath). [provider] Taking Active Self  pantoprazole (PROTONIX) 40 MG tablet [id number] Yes Take 1  tablet (40 mg total) by mouth daily. Zannie Cove, MD Taking Active Self  spironolactone (ALDACTONE) 25 MG tablet [id number]   Take 1 tablet (25 mg total) by mouth daily. Zannie Cove, MD   Active Self  tamsulosin [facility]) 0.4 MG CAPS capsule [id number] Yes Take 1 capsule (0.4 mg total) by mouth daily. Zannie Cove, MD Taking Active Self  torsemide (DEMADEX) 20 MG tablet [id number] Yes Take 20 mg by mouth 2 (two) times daily. [provider] Taking Active Self                  Home Care and Equipment/Supplies: Were Home Health Services Ordered?: No Any new equipment or medical supplies ordered?: No   Functional Questionnaire: Do you need assistance with bathing/showering or dressing?: No Do you need assistance with meal preparation?: No Do you need assistance with eating?: No Do you have difficulty maintaining continence: No Do you need assistance with getting out of bed/getting out of a chair/moving?: No Do you have difficulty managing or taking your medications?: No (the patient needs refills- will contact the provider)   Follow up appointments reviewed: PCP Follow-up appointment confirmed?: Yes Date of PCP follow-up appointment?: 06/15/22 (saw [name], PA- had his stitches removed also) Follow-up Provider: Criselda Peaches, PA Specialist Hospital Follow-up appointment confirmed?: NA Do you need transportation to your follow-up appointment?: No Do you understand care options if your condition(s) worsen?: Yes-patient verbalized understanding       Alto Denver RN, MSN, CCM RN Care Manager  Chronic Care Management Direct Number: [phone]   If you are experiencing a Mental Health or Behavioral Health Crisis or need someone to talk to, please call the Suicide and Crisis Lifeline: 988 call the [place] National Suicide Prevention Lifeline: 503 660 8223 or TTY: 561-539-1695 TTY (989)416-7125) to talk to a trained counselor call 1-800-273-TALK (toll free, 24 hour hotline) go to [facility] [address], [place] [phone])   Following is a copy of the CCM Program Consent:  CCM service includes  personalized support from designated clinical staff supervised by the physician, including individualized plan of care and coordination with other care providers 24/7 contact phone numbers for assistance for urgent and routine care needs. Service will only be billed when office clinical staff spend 20 minutes or more in a month to coordinate care. Only one practitioner may furnish and bill the service in a calendar month. The patient may stop CCM services at amy time (effective at the end of the month) by phone call to the office staff. The patient will be responsible for cost sharing (co-pay) or up to 20% of the service fee (after annual deductible is met)  Following is a copy of your full provider care plan:     The patient verbalized understanding of instructions, educational materials, and care plan provided today and DECLINED offer to receive copy of patient instructions, educational materials, and care plan.  Telephone follow up appointment with care management team member scheduled for: 07-27-2022 at 145 pm

## 2022-06-18 ENCOUNTER — Other Ambulatory Visit: Payer: Self-pay | Admitting: Family Medicine

## 2022-06-18 DIAGNOSIS — J449 Chronic obstructive pulmonary disease, unspecified: Secondary | ICD-10-CM

## 2022-06-18 DIAGNOSIS — I10 Essential (primary) hypertension: Secondary | ICD-10-CM

## 2022-06-18 DIAGNOSIS — Z7951 Long term (current) use of inhaled steroids: Secondary | ICD-10-CM

## 2022-06-18 DIAGNOSIS — I480 Paroxysmal atrial fibrillation: Secondary | ICD-10-CM

## 2022-06-18 DIAGNOSIS — I5023 Acute on chronic systolic (congestive) heart failure: Secondary | ICD-10-CM

## 2022-06-18 MED ORDER — SPIRONOLACTONE 25 MG PO TABS: 25.0000 mg | ORAL_TABLET | Freq: Every day | ORAL | 1 refills | Status: DC

## 2022-06-18 MED ORDER — TAMSULOSIN HCL 0.4 MG PO CAPS: 0.4000 mg | ORAL_CAPSULE | Freq: Every day | ORAL | 1 refills | Status: DC

## 2022-06-18 MED ORDER — PANTOPRAZOLE SODIUM 40 MG PO TBEC: 40.0000 mg | DELAYED_RELEASE_TABLET | Freq: Every day | ORAL | 3 refills | Status: DC

## 2022-06-18 MED ORDER — EMPAGLIFLOZIN 10 MG PO TABS: 10.0000 mg | ORAL_TABLET | Freq: Every day | ORAL | 1 refills | Status: DC

## 2022-06-19 DIAGNOSIS — J449 Chronic obstructive pulmonary disease, unspecified: Secondary | ICD-10-CM | POA: Diagnosis not present

## 2022-06-20 ENCOUNTER — Other Ambulatory Visit: Payer: Self-pay | Admitting: Family Medicine

## 2022-06-20 DIAGNOSIS — S01111A Laceration without foreign body of right eyelid and periocular area, initial encounter: Secondary | ICD-10-CM

## 2022-06-20 HISTORY — DX: Laceration without foreign body of right eyelid and periocular area, initial encounter: S01.111A

## 2022-06-20 NOTE — Assessment & Plan Note (Signed)
Healing well. Sutures removed. 

## 2022-06-21 ENCOUNTER — Ambulatory Visit: Payer: Medicare HMO | Admitting: Pulmonary Disease

## 2022-06-21 ENCOUNTER — Other Ambulatory Visit: Payer: Self-pay

## 2022-06-24 ENCOUNTER — Other Ambulatory Visit: Payer: Medicare HMO

## 2022-06-24 ENCOUNTER — Telehealth: Payer: Self-pay

## 2022-06-24 DIAGNOSIS — E871 Hypo-osmolality and hyponatremia: Secondary | ICD-10-CM

## 2022-06-24 DIAGNOSIS — A419 Sepsis, unspecified organism: Secondary | ICD-10-CM | POA: Diagnosis not present

## 2022-06-24 DIAGNOSIS — N390 Urinary tract infection, site not specified: Secondary | ICD-10-CM

## 2022-06-24 NOTE — Progress Notes (Signed)
  Care Coordination Note  06/24/2022 Name: Kyle Farley MRN: [id number] DOB: 1940/11/20  Kyle Farley is a 82 y.o. year old male who is a primary care patient of [name], Kirsten, MD and is actively engaged with the Chronic Care Management team. I reached out to Kyle Farley by phone today to assist with re-scheduling a follow up visit with the Pharmacist  Follow up plan: Unsuccessful telephone outreach attempt made. A HIPAA compliant phone message was left for the patient providing contact information and requesting a return call.  The care management team will reach out to the patient again over the next 7 days.  If patient returns call to provider office, please advise to call CCM Care Guide Erikka Follmer  at [phone]  Penne Lash, RMA Care Guide [facility]  [place], [place] [address] Direct Dial: [phone] [email or web address]

## 2022-06-25 ENCOUNTER — Other Ambulatory Visit: Payer: Self-pay

## 2022-06-25 DIAGNOSIS — R799 Abnormal finding of blood chemistry, unspecified: Secondary | ICD-10-CM

## 2022-06-25 LAB — COMPREHENSIVE METABOLIC PANEL
ALT: 17 IU/L (ref 0–44)
AST: 16 IU/L (ref 0–40)
Albumin/Globulin Ratio: 1.5 (ref 1.2–2.2)
Albumin: 4 g/dL (ref 3.8–4.8)
Alkaline Phosphatase: 96 IU/L (ref 44–121)
BUN/Creatinine Ratio: 17 (ref 10–24)
BUN: 17 mg/dL (ref 8–27)
Bilirubin Total: 0.5 mg/dL (ref 0.0–1.2)
CO2: 28 mmol/L (ref 20–29)
Calcium: 9.3 mg/dL (ref 8.6–10.2)
Chloride: 99 mmol/L (ref 96–106)
Creatinine, Ser: 1 mg/dL (ref 0.76–1.27)
Globulin, Total: 2.7 g/dL (ref 1.5–4.5)
Glucose: 98 mg/dL (ref 70–99)
Potassium: 5.4 mmol/L — ABNORMAL HIGH (ref 3.5–5.2)
Sodium: 138 mmol/L (ref 134–144)
Total Protein: 6.7 g/dL (ref 6.0–8.5)
eGFR: 76 mL/min/{1.73_m2} (ref >59)

## 2022-06-25 LAB — CBC WITH DIFFERENTIAL/PLATELET
Basophils Absolute: 0.1 10*3/uL (ref 0.0–0.2)
Basos: 1 %
EOS (ABSOLUTE): 0.1 10*3/uL (ref 0.0–0.4)
Eos: 2 %
Hematocrit: 35.8 % — ABNORMAL LOW (ref 37.5–51.0)
Hemoglobin: 11.1 g/dL — ABNORMAL LOW (ref 13.0–17.7)
Immature Grans (Abs): 0.1 10*3/uL (ref 0.0–0.1)
Immature Granulocytes: 1 %
Lymphocytes Absolute: 1.4 10*3/uL (ref 0.7–3.1)
Lymphs: 15 %
MCH: 27.5 pg (ref 26.6–33.0)
MCHC: 31 g/dL — ABNORMAL LOW (ref 31.5–35.7)
MCV: 89 fL (ref 79–97)
Monocytes Absolute: 0.7 10*3/uL (ref 0.1–0.9)
Monocytes: 8 %
Neutrophils Absolute: 6.6 10*3/uL (ref 1.4–7.0)
Neutrophils: 73 %
Platelets: 343 10*3/uL (ref 150–450)
RBC: 4.04 x10E6/uL — ABNORMAL LOW (ref 4.14–5.80)
RDW: 15.2 % (ref 11.6–15.4)
WBC: 9 10*3/uL (ref 3.4–10.8)

## 2022-06-29 ENCOUNTER — Other Ambulatory Visit: Payer: Medicare HMO | Admitting: Pharmacist

## 2022-06-30 ENCOUNTER — Other Ambulatory Visit: Payer: Self-pay

## 2022-06-30 ENCOUNTER — Other Ambulatory Visit: Payer: Self-pay | Admitting: Physician Assistant

## 2022-06-30 MED ORDER — TRELEGY ELLIPTA 200-62.5-25 MCG/ACT IN AEPB: 2.0000 | INHALATION_SPRAY | Freq: Every day | RESPIRATORY_TRACT | 1 refills | Status: DC

## 2022-07-05 ENCOUNTER — Other Ambulatory Visit: Payer: Medicare HMO

## 2022-07-05 ENCOUNTER — Other Ambulatory Visit: Payer: Self-pay

## 2022-07-05 DIAGNOSIS — R799 Abnormal finding of blood chemistry, unspecified: Secondary | ICD-10-CM | POA: Diagnosis not present

## 2022-07-05 LAB — CBC WITH DIFFERENTIAL/PLATELET
Basophils Absolute: 0.1 10*3/uL (ref 0.0–0.2)
Basos: 1 %
EOS (ABSOLUTE): 0.2 10*3/uL (ref 0.0–0.4)
Eos: 2 %
Hematocrit: 36.1 % — ABNORMAL LOW (ref 37.5–51.0)
Hemoglobin: 11.6 g/dL — ABNORMAL LOW (ref 13.0–17.7)
Immature Grans (Abs): 0.1 10*3/uL (ref 0.0–0.1)
Immature Granulocytes: 1 %
Lymphocytes Absolute: 1.8 10*3/uL (ref 0.7–3.1)
Lymphs: 17 %
MCH: 27.7 pg (ref 26.6–33.0)
MCHC: 32.1 g/dL (ref 31.5–35.7)
MCV: 86 fL (ref 79–97)
Monocytes Absolute: 1.1 10*3/uL — ABNORMAL HIGH (ref 0.1–0.9)
Monocytes: 10 %
Neutrophils Absolute: 7.4 10*3/uL — ABNORMAL HIGH (ref 1.4–7.0)
Neutrophils: 69 %
Platelets: 264 10*3/uL (ref 150–450)
RBC: 4.19 x10E6/uL (ref 4.14–5.80)
RDW: 15.1 % (ref 11.6–15.4)
WBC: 10.7 10*3/uL (ref 3.4–10.8)

## 2022-07-05 MED ORDER — TRELEGY ELLIPTA 200-62.5-25 MCG/ACT IN AEPB: INHALATION_SPRAY | RESPIRATORY_TRACT | 3 refills | Status: DC

## 2022-07-06 ENCOUNTER — Telehealth: Payer: Self-pay

## 2022-07-06 NOTE — Telephone Encounter (Signed)
error 

## 2022-07-17 DIAGNOSIS — J449 Chronic obstructive pulmonary disease, unspecified: Secondary | ICD-10-CM | POA: Diagnosis not present

## 2022-07-19 DIAGNOSIS — J449 Chronic obstructive pulmonary disease, unspecified: Secondary | ICD-10-CM | POA: Diagnosis not present

## 2022-07-23 ENCOUNTER — Other Ambulatory Visit: Payer: Self-pay

## 2022-07-23 DIAGNOSIS — Z7951 Long term (current) use of inhaled steroids: Secondary | ICD-10-CM

## 2022-07-23 MED ORDER — IPRATROPIUM-ALBUTEROL 0.5-2.5 (3) MG/3ML IN SOLN
3.0000 mL | Freq: Four times a day (QID) | RESPIRATORY_TRACT | 1 refills | Status: DC | PRN
Start: 2022-07-23 — End: 2022-08-25

## 2022-07-26 ENCOUNTER — Ambulatory Visit: Payer: Medicare HMO

## 2022-07-27 ENCOUNTER — Telehealth: Payer: Self-pay

## 2022-07-27 ENCOUNTER — Telehealth: Payer: Medicare HMO

## 2022-07-27 NOTE — Telephone Encounter (Signed)
   CCM RN Visit Note   07-27-2022 Name: Kyle Farley MRN: [id number]      DOB: 1940/09/16  Subjective: Kyle Farley is a 82 y.o. year old male who is a primary care patient of Dr. Blane Ohara The patient was referred to the Chronic Care Management team for assistance with care management needs subsequent to provider initiation of CCM services and plan of care.      An unsuccessful telephone outreach was attempted today to contact the patient about Chronic Care Management needs.    Plan:The care management team will reach out to the patient again over the next 30 days.  Alto Denver RN, MSN, CCM RN Care Manager  Chronic Care Management Direct Number: [phone]

## 2022-08-01 DIAGNOSIS — M19012 Primary osteoarthritis, left shoulder: Secondary | ICD-10-CM | POA: Diagnosis not present

## 2022-08-01 DIAGNOSIS — S2241XA Multiple fractures of ribs, right side, initial encounter for closed fracture: Secondary | ICD-10-CM | POA: Diagnosis not present

## 2022-08-01 DIAGNOSIS — R55 Syncope and collapse: Secondary | ICD-10-CM | POA: Diagnosis not present

## 2022-08-01 DIAGNOSIS — I4510 Unspecified right bundle-branch block: Secondary | ICD-10-CM | POA: Diagnosis not present

## 2022-08-01 DIAGNOSIS — J449 Chronic obstructive pulmonary disease, unspecified: Secondary | ICD-10-CM | POA: Diagnosis not present

## 2022-08-01 DIAGNOSIS — S60812A Abrasion of left wrist, initial encounter: Secondary | ICD-10-CM | POA: Diagnosis not present

## 2022-08-01 DIAGNOSIS — I7143 Infrarenal abdominal aortic aneurysm, without rupture: Secondary | ICD-10-CM | POA: Diagnosis not present

## 2022-08-01 DIAGNOSIS — I509 Heart failure, unspecified: Secondary | ICD-10-CM | POA: Diagnosis not present

## 2022-08-01 DIAGNOSIS — R059 Cough, unspecified: Secondary | ICD-10-CM | POA: Diagnosis not present

## 2022-08-01 DIAGNOSIS — F10130 Alcohol abuse with withdrawal, uncomplicated: Secondary | ICD-10-CM | POA: Diagnosis not present

## 2022-08-01 DIAGNOSIS — E871 Hypo-osmolality and hyponatremia: Secondary | ICD-10-CM | POA: Diagnosis not present

## 2022-08-01 DIAGNOSIS — R9089 Other abnormal findings on diagnostic imaging of central nervous system: Secondary | ICD-10-CM | POA: Diagnosis not present

## 2022-08-01 DIAGNOSIS — R9431 Abnormal electrocardiogram [ECG] [EKG]: Secondary | ICD-10-CM | POA: Diagnosis not present

## 2022-08-01 DIAGNOSIS — I440 Atrioventricular block, first degree: Secondary | ICD-10-CM | POA: Diagnosis not present

## 2022-08-01 DIAGNOSIS — T07XXXA Unspecified multiple injuries, initial encounter: Secondary | ICD-10-CM | POA: Diagnosis not present

## 2022-08-01 DIAGNOSIS — I350 Nonrheumatic aortic (valve) stenosis: Secondary | ICD-10-CM | POA: Diagnosis not present

## 2022-08-01 DIAGNOSIS — I5032 Chronic diastolic (congestive) heart failure: Secondary | ICD-10-CM | POA: Diagnosis not present

## 2022-08-01 DIAGNOSIS — I6523 Occlusion and stenosis of bilateral carotid arteries: Secondary | ICD-10-CM | POA: Diagnosis not present

## 2022-08-01 DIAGNOSIS — I700 Atherosclerosis of aorta: Secondary | ICD-10-CM | POA: Diagnosis not present

## 2022-08-01 DIAGNOSIS — J440 Chronic obstructive pulmonary disease with acute lower respiratory infection: Secondary | ICD-10-CM | POA: Diagnosis not present

## 2022-08-01 DIAGNOSIS — R001 Bradycardia, unspecified: Secondary | ICD-10-CM | POA: Diagnosis not present

## 2022-08-01 DIAGNOSIS — M25532 Pain in left wrist: Secondary | ICD-10-CM | POA: Diagnosis not present

## 2022-08-01 DIAGNOSIS — J189 Pneumonia, unspecified organism: Secondary | ICD-10-CM | POA: Diagnosis not present

## 2022-08-01 DIAGNOSIS — S0081XA Abrasion of other part of head, initial encounter: Secondary | ICD-10-CM | POA: Diagnosis not present

## 2022-08-01 NOTE — Progress Notes (Deleted)
Cardiology Office Note:    Date:  08/01/2022   ID:  Kyle Farley, DOB [birth year], MRN [id number]  PCP:  Kyle Ohara, MD   [facility] Providers Cardiologist:  None     Referring MD: Kyle Ohara, MD   CC: hospital follow up   History of Present Illness:    Kyle Farley is a 82 y.o. male with a hx of paroxysmal atrial fibrillation, non-obstructive CAD, hypertension, CHF, AAA, PAD, COPD O2 HS, GERD, acquired hypothyroidism, history of alcohol abuse, BPH, hyperlipidemia.  He is an established patient with Kyle Farley at least since 2016 for the management of PAF, hypertension.  Echo in August 2022 revealed an EF of 40 to 45%, global hypokinesis, diastolic parameters were indeterminate, left atrium moderately dilated, mild MR. LHC on 09/15/2020 revealed nonobstructive CAD with mild nonobstructive plaquing in the LAD, left circumflex, moderate calcific stenosis of the RCA ostium. Echo in May 2023 revealed an EF of 50 to 55%, severely dilated left atrium, mildly enlarged right atrium, trace to mild AR, mild aortic stenosis, mild MR.  He was admitted on 04/24/2022 to 04/30/2022 for acute on chronic diastolic heart failure, after complaints of progressive shortness of breath, orthopnea and chest pain.  He was bradycardic in the emergency department with his heart rate in the 30s, pedal edema, chest x-ray revealed mild pulmonary vascular congestion.  He was diuresed 12.5 L, creatinine trended up to 1.5, his losartan was held.  Echo at this time revealed EF 60 to 65%, mild concentric LVH, grade 2 DD, moderate dilatation of right and left atrium, mild MR, mild calcification of the aortic valve, moderate thickening of the aortic valve, mild to moderate aortic valve stenosis.  Since his discharge he has been evaluated by his PCP on 05/05/2022, he had been requiring oxygen since discharge.  He was evaluated by heart failure TOC on 05/10/2022, said he had not felt well for the previous 2 days,  increased fatigue and weakness.  His amiodarone was stopped at this visit due to junctional rhythm, heart rate 45 bpm.  Low-dose metoprolol was continued.  Head CT was arranged due to his sudden onset of dizziness however it was negative for any intracranial findings.  Blood work at this time revealed a sodium of 130, albumin 3.2.  He presented 2 weeks ago for hospital follow-up.  He had been weighing himself daily, weights were consistently between 208 to 210, weight in the office was 217. He was sporadically taking his Demadex due to inconvenience of increased urination.  He was continuing to drink at least five 12 ounce beers per day, as well as smoking 1 to 2 packs/day--was not inclined to decrease his amount of tobacco or alcohol use.  Repeat CMET on 05/27/2022 revealed sodium 137, potassium 4.9, creatinine 1.32, GFR 55, normal LFTs.  He presents today accompanied by his son for follow-up of his heart failure.  Clinically, he looks much better than he did at our previous visit.  His weight in the office today is 205 which is down from 217 at his last visit.  He is weighing himself daily.  He has cut back from 5 beers a day, to 2-3 beers per day.  He has added 1 bottle of water to his fluid intake.  He continues to smoke and has not made any progress with decreasing his tobacco intake, nor is he interested.  He states he feels very well overall, " a lot better than I did a few weeks ago".  We discussed the importance of continuing to weigh daily, and the indications of what sudden changes in his weight mean, as well as what to do when he notices a sudden change in his weight.  He has been trying to walk around more at his house. He denies chest pain, palpitations, pnd, orthopnea, n, v, dizziness, syncope, weight gain, or early satiety.   Admitted on 06/13/2022 after presenting to [facility] with exertional dyspnea, pulmonary edema.  He was found to be septic due to uncomplicated UTI and E. coli  bacteremia in the setting of a right kidney stone.  Labs on 06/24/2022 sodium 138, potassium 5.4, normal LFTs, hemoglobin 11.1, hematocrit 35.8  Past Medical History:  Diagnosis Date   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 (HCC) 11/10/2021   Acquired hypothyroidism 11/10/2021   Acute hypoxic respiratory failure (HCC) 04/25/2022   Acute on chronic diastolic heart failure (HCC) 04/25/2022   Acute on chronic systolic heart failure (HCC) 11/10/2021   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/10/2021   Alcohol use 09/12/2020   Anemia of chronic disease 04/25/2022   Arthritis    Atherosclerosis of native arteries of extremities with intermittent claudication, bilateral legs (HCC) 11/10/2021   Bilateral leg edema 02/16/2022   BPH (benign prostatic hyperplasia) 11/10/2021   CHF (congestive heart failure) (HCC)    Chronic hyponatremia 04/25/2022   Chronic respiratory failure with hypoxia (HCC) 05/09/2022   Cigarette smoker 10/21/2014   Class 1 obesity 04/25/2022   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)    Dyspnea    Dysrhythmia    atrial fibrillation   Encounter for prostate cancer screening 02/16/2022   Encounter for screening for lung cancer 02/16/2022   Essential hypertension 10/21/2014   Femur fracture, right (HCC) 09/12/2020   GERD (gastroesophageal reflux disease)    History of kidney stones    Hyperlipidemia 01/24/2017   Hypertension    Hyponatremia 04/26/2022   Kidney stones 01/24/2017   Paroxysmal atrial fibrillation (HCC) 10/21/2014   Pressure injury of skin 09/16/2020   SOB (shortness of breath) 05/10/2022   Tobacco abuse 02/16/2022    Past Surgical History:  Procedure Laterality Date   LEFT HEART CATH AND CORONARY ANGIOGRAPHY N/A 09/15/2020   Procedure: LEFT HEART CATH AND CORONARY ANGIOGRAPHY;  Surgeon: Kyle Bollman, MD;  Location: [facility] INVASIVE CV LAB;  Service: Cardiovascular;  Laterality: N/A;   LEG SURGERY Right    steal pin placed in  right leg 35 years ago   ORIF FEMUR FRACTURE Right 09/15/2020   Procedure: OPEN REDUCTION INTERNAL FIXATION FEMORAL SHAFT FRACTURE;  Surgeon: Roby Lofts, MD;  Location: [facility] OR;  Service: Orthopedics;  Laterality: Right;    Current Medications: No outpatient medications have been marked as taking for the 08/03/22 encounter (Appointment) with Flossie Dibble, NP.     Allergies:   Patient has no known allergies.   Social History   Socioeconomic History   Marital status: Widowed    Spouse name: Not on file   Number of children: 3   Years of education: Not on file   Highest education level: High school graduate  Occupational History   Occupation: retired  Tobacco Use   Smoking status: Every Day    Current packs/day: 1.00    Average packs/day: 1 pack/day for 62.0 years (62.0 ttl pk-yrs)    Types: Cigarettes, Cigars   Smokeless tobacco: Never  Vaping Use   Vaping status: Never Used  Substance and Sexual Activity   Alcohol use: Yes  Alcohol/week: 5.0 standard drinks of alcohol    Types: 5 Standard drinks or equivalent per week    Comment: 4-5 beers per day   Drug use: No   Sexual activity: Not Currently  Other Topics Concern   Not on file  Social History Narrative   Not on file   Social Determinants of Health   Financial Resource Strain: Low Risk  (04/26/2022)   Overall Financial Resource Strain (CARDIA)    Difficulty of Paying Living Expenses: Not very hard  Food Insecurity: No Food Insecurity (04/15/2022)   Hunger Vital Sign    Worried About Running Out of Food in the Last Year: Never true    Ran Out of Food in the Last Year: Never true  Transportation Needs: No Transportation Needs (04/26/2022)   PRAPARE - Administrator, Civil Service (Medical): No    Lack of Transportation (Non-Medical): No  Physical Activity: Inactive (04/15/2022)   Exercise Vital Sign    Days of Exercise per Week: 0 days    Minutes of Exercise per Session: 0 min  Stress: No Stress  Concern Present (04/15/2022)   [employer] of Occupational Health - Occupational Stress Questionnaire    Feeling of Stress : Not at all  Social Connections: Moderately Isolated (04/15/2022)   Social Connection and Isolation Panel [NHANES]    Frequency of Communication with Friends and Family: More than three times a week    Frequency of Social Gatherings with Friends and Family: More than three times a week    Attends Religious Services: More than 4 times per year    Active Member of [employer] or Organizations: No    Attends Banker Meetings: Never    Marital Status: Widowed     Family History: The patient's family history includes Breast cancer in his sister; Cancer in his mother; Diabetes in his mother.  ROS:   Please see the history of present illness.     All other systems reviewed and are negative.  EKGs/Labs/Other Studies Reviewed:    The following studies were reviewed today: Cardiac Studies & Procedures   CARDIAC CATHETERIZATION  CARDIAC CATHETERIZATION 09/15/2020  Narrative   Ost RCA to Prox RCA lesion is 50% stenosed.   Prox LAD lesion is 30% stenosed with 0% stenosed side branch in 1st Diag.   Prox Cx to Mid Cx lesion is 40% stenosed.  1.  Nonobstructive coronary artery disease with mild nonobstructive plaquing in the LAD, left circumflex, and moderate calcific stenosis of the RCA ostium. 2.  Low normal LVEDP  Recommend: No high-grade coronary stenoses identified.  Patient with femur fracture.  He should not be at excessive risk of ischemic cardiac complications.  Findings Coronary Findings Diagnostic  Dominance: Right  Left Main The vessel exhibits minimal luminal irregularities. The left main is patent with no obstructive disease.  The left main trifurcates into the LAD, left circumflex, and a small intermediate branch.  Left Anterior Descending Prox LAD lesion is 30% stenosed with 0% stenosed side branch in 1st Diag. Mild plaquing noted at the  first septal perforator with hypodensity and approximately 30% stenosis  Left Circumflex Prox Cx to Mid Cx lesion is 40% stenosed.  Right Coronary Artery There is mild diffuse disease throughout the vessel. Ost RCA to Prox RCA lesion is 50% stenosed. The lesion is calcified. Heavy calcification of the right coronary cusp with moderate nonobstructive plaquing of 50%.  There is good contrast reflux and no pressure dampening with the catheter engaged  in the artery.  Intervention  No interventions have been documented.     ECHOCARDIOGRAM  ECHOCARDIOGRAM COMPLETE 04/25/2022  Narrative ECHOCARDIOGRAM REPORT    Patient Name:   TILAK BRIETZKE Date of Exam: 04/25/2022 Medical Rec #:  [id number]       Height:       69.0 in Accession #:    [id number]      Weight:       222.0 lb Date of Birth:  Jun 01, 1940        BSA:          2.160 m Patient Age:    81 years        BP:           124/93 mmHg Patient Gender: M               HR:           56 bpm. Exam Location:  Inpatient  Procedure: 2D Echo, Color Doppler, Cardiac Doppler and Intracardiac Opacification Agent  Indications:     I50.31 Acute diastolic (congestive) heart failure  History:         Patient has prior history of Echocardiogram examinations, most recent 06/19/2021. CHF, COPD, Arrythmias:Atrial Fibrillation; Risk Factors:Hypertension and Dyslipidemia.  Sonographer:     Irving Burton Senior RDCS Referring Phys:  Angie Fava Diagnosing Phys: Thurmon Fair MD   Sonographer Comments: Technically difficult due to lung interference. IMPRESSIONS   1. Left ventricular ejection fraction, by estimation, is 60 to 65%. The left ventricle has normal function. The left ventricle has no regional wall motion abnormalities. There is mild concentric left ventricular hypertrophy. Left ventricular diastolic parameters are consistent with Grade II diastolic dysfunction (pseudonormalization). 2. Right ventricular systolic function is low normal. The  right ventricular size is moderately enlarged. 3. Left atrial size was moderately dilated. 4. Right atrial size was moderately dilated. 5. The mitral valve is degenerative. Mild mitral valve regurgitation. 6. The aortic valve is calcified. There is mild calcification of the aortic valve. There is moderate thickening of the aortic valve. Aortic valve regurgitation is trivial. Mild to moderate aortic valve stenosis. Aortic valve mean gradient measures 17.0 mmHg. Aortic valve Vmax measures 2.82 m/s. 7. The inferior vena cava is normal in size with greater than 50% respiratory variability, suggesting right atrial pressure of 3 mmHg.  Comparison(s): Prior images reviewed side by side. The left ventricular function has improved. Aortic stenosis is now present.  FINDINGS Left Ventricle: Left ventricular ejection fraction, by estimation, is 60 to 65%. The left ventricle has normal function. The left ventricle has no regional wall motion abnormalities. Definity contrast agent was given IV to delineate the left ventricular endocardial borders. The left ventricular internal cavity size was normal in size. There is mild concentric left ventricular hypertrophy. Left ventricular diastolic parameters are consistent with Grade II diastolic dysfunction (pseudonormalization).  Right Ventricle: The right ventricular size is moderately enlarged. Right vetricular wall thickness was not well visualized. Right ventricular systolic function is low normal.  Left Atrium: Left atrial size was moderately dilated.  Right Atrium: Right atrial size was moderately dilated.  Pericardium: There is no evidence of pericardial effusion. Presence of epicardial fat layer.  Mitral Valve: The mitral valve is degenerative in appearance. Mild mitral annular calcification. Mild mitral valve regurgitation.  Tricuspid Valve: The tricuspid valve is grossly normal. Tricuspid valve regurgitation is trivial.  Aortic Valve: The aortic valve  is calcified. There is mild calcification of the aortic valve. There is moderate  thickening of the aortic valve. Aortic valve regurgitation is trivial. Mild to moderate aortic stenosis is present. Aortic valve mean gradient measures 17.0 mmHg. Aortic valve peak gradient measures 31.8 mmHg. Aortic valve area, by VTI measures 1.53 cm.  Pulmonic Valve: The pulmonic valve was grossly normal. Pulmonic valve regurgitation is not visualized.  Aorta: The aortic root and ascending aorta are structurally normal, with no evidence of dilitation.  Venous: The inferior vena cava is normal in size with greater than 50% respiratory variability, suggesting right atrial pressure of 3 mmHg.  IAS/Shunts: No atrial level shunt detected by color flow Doppler.   LEFT VENTRICLE PLAX 2D LVIDd:         4.00 cm   Diastology LVIDs:         2.60 cm   LV e' medial:    6.53 cm/s LV PW:         1.30 cm   LV E/e' medial:  12.5 LV IVS:        1.10 cm   LV e' lateral:   7.83 cm/s LVOT diam:     2.30 cm   LV E/e' lateral: 10.4 LV SV:         107 LV SV Index:   50 LVOT Area:     4.15 cm   RIGHT VENTRICLE RV S prime:     11.10 cm/s TAPSE (M-mode): 2.3 cm  LEFT ATRIUM             Index        RIGHT ATRIUM           Index LA diam:        3.80 cm 1.76 cm/m   RA Area:     28.00 cm LA Vol (A2C):   74.9 ml 34.68 ml/m  RA Volume:   94.80 ml  43.89 ml/m LA Vol (A4C):   88.9 ml 41.16 ml/m LA Biplane Vol: 92.3 ml 42.74 ml/m AORTIC VALVE AV Area (Vmax):    1.89 cm AV Area (Vmean):   1.63 cm AV Area (VTI):     1.53 cm AV Vmax:           282.00 cm/s AV Vmean:          195.000 cm/s AV VTI:            0.699 m AV Peak Grad:      31.8 mmHg AV Mean Grad:      17.0 mmHg LVOT Vmax:         128.00 cm/s LVOT Vmean:        76.600 cm/s LVOT VTI:          0.258 m LVOT/AV VTI ratio: 0.37  AORTA Ao Root diam: 3.30 cm Ao Asc diam:  3.30 cm  MITRAL VALVE MV Area (PHT): 3.21 cm    SHUNTS MV Decel Time: 236 msec     Systemic VTI:  0.26 m MV E velocity: 81.70 cm/s  Systemic Diam: 2.30 cm MV A velocity: 84.90 cm/s MV E/A ratio:  0.96  Mihai Croitoru MD Electronically signed by Thurmon Fair MD Signature Date/Time: 04/25/2022/3:27:20 PM    Final (Updated)              EKG:  EKG is  ordered today.  The ekg ordered today demonstrates sinus bradycardia, HR 59 bpm, RBBB, LAD, consistent with prior EKG tracings.   Recent Labs: 01/28/2022: NT-Pro BNP 379 05/05/2022: TSH 3.770 06/10/2022: B Natriuretic Peptide 839.5; Magnesium 2.0 06/24/2022: ALT 17; BUN  17; Creatinine, Ser 1.00; Potassium 5.4; Sodium 138 07/05/2022: Hemoglobin 11.6; Platelets 264  Recent Lipid Panel    Component Value Date/Time   CHOL 188 10/14/2021 0827   TRIG 93 10/14/2021 0827   HDL 92 10/14/2021 0827   CHOLHDL 2.0 10/14/2021 0827   LDLCALC 80 10/14/2021 0827     Risk Assessment/Calculations:    CHA2DS2-VASc Score = 5   This indicates a 7.2% annual risk of stroke. The patient's score is based upon: CHF History: 1 HTN History: 1 Diabetes History: 0 Stroke History: 0 Vascular Disease History: 1 Age Score: 2 Gender Score: 0   {This patient has a significant risk of stroke if diagnosed with atrial fibrillation.  Please consider VKA or DOAC agent for anticoagulation if the bleeding risk is acceptable.   You can also use the SmartPhrase .[email or web address] for documentation.   :[id number]}           Physical Exam:    VS:  There were no vitals taken for this visit.    Wt Readings from Last 3 Encounters:  06/15/22 203 lb (92.1 kg)  06/03/22 205 lb 6.4 oz (93.2 kg)  05/27/22 208 lb (94.3 kg)     GEN: ill appearing, no acute distress, ETOH noted on breath HEENT: Normal NECK: No JVD; No carotid bruits LYMPHATICS: No lymphadenopathy CARDIAC: RRR, no murmurs, rubs, gallops RESPIRATORY:  Trace rales noted, no wheezing ABDOMEN: Soft, non-tender, non-distended MUSCULOSKELETAL:   appearing legs; No deformity  SKIN: Warm  and dry NEUROLOGIC:  Alert and oriented x 3 PSYCHIATRIC:  Normal affect   ASSESSMENT:    No diagnosis found.   PLAN:    In order of problems listed above:  HFpEF - NYHA class II today, euvolemic. Weight is 205 in office today, was 217 at last OV two weeks ago.  He is weighing himself daily.  He has reduced his intake of beer, replaced with water. We discussed fluid restriction and salt restriction. Continue Jardiance 10 mg daily, continue metoprolol 12.5 mg daily, continue Aldactone 25 mg daily, continue Demadex 20 mg twice daily. Losartan was held at most recent hospital d/c d/t AKI. Most recent Cr is elevated, PCP wants to recheck in 1 month. Would like to start him on Entresto, however will wait to see if kidney function returns to baseline.  CAD-LHC in 2022 revealed nonobstructive CAD with mild nonobstructive plaquing in the LAD, LCx, moderate calcification of the RCA. Stable with no anginal symptoms. No indication for ischemic evaluation.  Continue Lipitor 40 mg daily, continue Toprol 12.5 mg daily. PAF-CHA2DS2-VASc score 5, continue Eliquis 5 mg twice daily--no indication for dose reduction, continue Toprol 12.5 mg daily. Denies hematochezia, hematuria, hemoptysis.  Hyponatremia-resolved, most recent sodium 137. EtOH abuse-he has made concerted efforts to cut back on his drinking, is only drinking 2-3 beers per day.  Congratulated him on that, encouraged him to further cut back. Tobacco abuse - smokes 1-2 PPD, not interested in stopping. He does not smoke while wearing his oxygen HS, he is adamant that he is careful. Total cessation encouraged.    Disposition -return in 2 months.       Medication Adjustments/Labs and Tests Ordered: Current medicines are reviewed at length with the patient today.  Concerns regarding medicines are outlined above.  No orders of the defined types were placed in this encounter.  No orders of the defined types were placed in this encounter.   There  are no Patient Instructions on file for this visit.   Signed,  Flossie Dibble, NP  08/01/2022 5:51 PM    [facility]

## 2022-08-02 DIAGNOSIS — I350 Nonrheumatic aortic (valve) stenosis: Secondary | ICD-10-CM | POA: Diagnosis not present

## 2022-08-02 DIAGNOSIS — F10130 Alcohol abuse with withdrawal, uncomplicated: Secondary | ICD-10-CM | POA: Diagnosis not present

## 2022-08-02 DIAGNOSIS — I4510 Unspecified right bundle-branch block: Secondary | ICD-10-CM | POA: Diagnosis not present

## 2022-08-02 DIAGNOSIS — I440 Atrioventricular block, first degree: Secondary | ICD-10-CM | POA: Diagnosis not present

## 2022-08-02 DIAGNOSIS — E871 Hypo-osmolality and hyponatremia: Secondary | ICD-10-CM | POA: Diagnosis not present

## 2022-08-02 DIAGNOSIS — J189 Pneumonia, unspecified organism: Secondary | ICD-10-CM | POA: Diagnosis not present

## 2022-08-03 ENCOUNTER — Ambulatory Visit: Payer: Medicare HMO | Admitting: Cardiology

## 2022-08-03 DIAGNOSIS — I350 Nonrheumatic aortic (valve) stenosis: Secondary | ICD-10-CM | POA: Diagnosis not present

## 2022-08-03 DIAGNOSIS — F1010 Alcohol abuse, uncomplicated: Secondary | ICD-10-CM

## 2022-08-03 DIAGNOSIS — F10130 Alcohol abuse with withdrawal, uncomplicated: Secondary | ICD-10-CM | POA: Diagnosis not present

## 2022-08-03 DIAGNOSIS — E871 Hypo-osmolality and hyponatremia: Secondary | ICD-10-CM | POA: Diagnosis not present

## 2022-08-03 DIAGNOSIS — I5030 Unspecified diastolic (congestive) heart failure: Secondary | ICD-10-CM

## 2022-08-03 DIAGNOSIS — I4510 Unspecified right bundle-branch block: Secondary | ICD-10-CM | POA: Diagnosis not present

## 2022-08-03 DIAGNOSIS — J189 Pneumonia, unspecified organism: Secondary | ICD-10-CM | POA: Diagnosis not present

## 2022-08-03 DIAGNOSIS — I2510 Atherosclerotic heart disease of native coronary artery without angina pectoris: Secondary | ICD-10-CM

## 2022-08-03 DIAGNOSIS — I480 Paroxysmal atrial fibrillation: Secondary | ICD-10-CM

## 2022-08-03 DIAGNOSIS — I440 Atrioventricular block, first degree: Secondary | ICD-10-CM | POA: Diagnosis not present

## 2022-08-04 DIAGNOSIS — Z6829 Body mass index (BMI) 29.0-29.9, adult: Secondary | ICD-10-CM | POA: Diagnosis not present

## 2022-08-04 DIAGNOSIS — I5032 Chronic diastolic (congestive) heart failure: Secondary | ICD-10-CM | POA: Diagnosis not present

## 2022-08-04 DIAGNOSIS — J189 Pneumonia, unspecified organism: Secondary | ICD-10-CM | POA: Diagnosis not present

## 2022-08-04 DIAGNOSIS — D649 Anemia, unspecified: Secondary | ICD-10-CM | POA: Diagnosis not present

## 2022-08-04 DIAGNOSIS — J441 Chronic obstructive pulmonary disease with (acute) exacerbation: Secondary | ICD-10-CM | POA: Diagnosis not present

## 2022-08-04 DIAGNOSIS — E860 Dehydration: Secondary | ICD-10-CM | POA: Diagnosis not present

## 2022-08-04 DIAGNOSIS — R911 Solitary pulmonary nodule: Secondary | ICD-10-CM | POA: Diagnosis not present

## 2022-08-04 DIAGNOSIS — R001 Bradycardia, unspecified: Secondary | ICD-10-CM | POA: Diagnosis not present

## 2022-08-04 DIAGNOSIS — E119 Type 2 diabetes mellitus without complications: Secondary | ICD-10-CM | POA: Diagnosis not present

## 2022-08-04 DIAGNOSIS — E785 Hyperlipidemia, unspecified: Secondary | ICD-10-CM | POA: Diagnosis not present

## 2022-08-04 DIAGNOSIS — F1020 Alcohol dependence, uncomplicated: Secondary | ICD-10-CM | POA: Diagnosis not present

## 2022-08-04 DIAGNOSIS — E669 Obesity, unspecified: Secondary | ICD-10-CM | POA: Diagnosis not present

## 2022-08-04 DIAGNOSIS — E871 Hypo-osmolality and hyponatremia: Secondary | ICD-10-CM | POA: Diagnosis not present

## 2022-08-04 DIAGNOSIS — J440 Chronic obstructive pulmonary disease with acute lower respiratory infection: Secondary | ICD-10-CM | POA: Diagnosis not present

## 2022-08-04 DIAGNOSIS — J9621 Acute and chronic respiratory failure with hypoxia: Secondary | ICD-10-CM | POA: Diagnosis not present

## 2022-08-04 DIAGNOSIS — I714 Abdominal aortic aneurysm, without rupture, unspecified: Secondary | ICD-10-CM | POA: Diagnosis not present

## 2022-08-04 DIAGNOSIS — I480 Paroxysmal atrial fibrillation: Secondary | ICD-10-CM | POA: Diagnosis not present

## 2022-08-04 DIAGNOSIS — K5900 Constipation, unspecified: Secondary | ICD-10-CM | POA: Diagnosis not present

## 2022-08-04 DIAGNOSIS — N400 Enlarged prostate without lower urinary tract symptoms: Secondary | ICD-10-CM | POA: Diagnosis not present

## 2022-08-04 DIAGNOSIS — I9589 Other hypotension: Secondary | ICD-10-CM | POA: Diagnosis not present

## 2022-08-04 DIAGNOSIS — F17210 Nicotine dependence, cigarettes, uncomplicated: Secondary | ICD-10-CM | POA: Diagnosis not present

## 2022-08-05 ENCOUNTER — Ambulatory Visit: Payer: Medicare HMO | Admitting: Family Medicine

## 2022-08-05 ENCOUNTER — Telehealth: Payer: Self-pay

## 2022-08-05 ENCOUNTER — Encounter: Payer: Self-pay | Admitting: Family Medicine

## 2022-08-05 VITALS — BP 110/50 | HR 60 | Temp 97.3°F | Resp 18 | Ht 69.0 in | Wt 214.0 lb

## 2022-08-05 DIAGNOSIS — R001 Bradycardia, unspecified: Secondary | ICD-10-CM | POA: Insufficient documentation

## 2022-08-05 DIAGNOSIS — I480 Paroxysmal atrial fibrillation: Secondary | ICD-10-CM | POA: Diagnosis not present

## 2022-08-05 DIAGNOSIS — R55 Syncope and collapse: Secondary | ICD-10-CM

## 2022-08-05 DIAGNOSIS — I350 Nonrheumatic aortic (valve) stenosis: Secondary | ICD-10-CM | POA: Diagnosis not present

## 2022-08-05 DIAGNOSIS — E871 Hypo-osmolality and hyponatremia: Secondary | ICD-10-CM

## 2022-08-05 DIAGNOSIS — I495 Sick sinus syndrome: Secondary | ICD-10-CM | POA: Diagnosis not present

## 2022-08-05 DIAGNOSIS — F17210 Nicotine dependence, cigarettes, uncomplicated: Secondary | ICD-10-CM | POA: Diagnosis not present

## 2022-08-05 DIAGNOSIS — R9431 Abnormal electrocardiogram [ECG] [EKG]: Secondary | ICD-10-CM | POA: Diagnosis not present

## 2022-08-05 DIAGNOSIS — I959 Hypotension, unspecified: Secondary | ICD-10-CM | POA: Diagnosis not present

## 2022-08-05 DIAGNOSIS — Z9981 Dependence on supplemental oxygen: Secondary | ICD-10-CM | POA: Diagnosis not present

## 2022-08-05 DIAGNOSIS — I110 Hypertensive heart disease with heart failure: Secondary | ICD-10-CM | POA: Diagnosis not present

## 2022-08-05 DIAGNOSIS — J189 Pneumonia, unspecified organism: Secondary | ICD-10-CM

## 2022-08-05 DIAGNOSIS — N400 Enlarged prostate without lower urinary tract symptoms: Secondary | ICD-10-CM | POA: Diagnosis not present

## 2022-08-05 DIAGNOSIS — J9621 Acute and chronic respiratory failure with hypoxia: Secondary | ICD-10-CM

## 2022-08-05 DIAGNOSIS — F1020 Alcohol dependence, uncomplicated: Secondary | ICD-10-CM | POA: Diagnosis not present

## 2022-08-05 DIAGNOSIS — J441 Chronic obstructive pulmonary disease with (acute) exacerbation: Secondary | ICD-10-CM | POA: Diagnosis not present

## 2022-08-05 DIAGNOSIS — Z792 Long term (current) use of antibiotics: Secondary | ICD-10-CM | POA: Diagnosis not present

## 2022-08-05 DIAGNOSIS — I5032 Chronic diastolic (congestive) heart failure: Secondary | ICD-10-CM | POA: Diagnosis not present

## 2022-08-05 DIAGNOSIS — Z79899 Other long term (current) drug therapy: Secondary | ICD-10-CM | POA: Diagnosis not present

## 2022-08-05 DIAGNOSIS — R918 Other nonspecific abnormal finding of lung field: Secondary | ICD-10-CM | POA: Diagnosis not present

## 2022-08-05 DIAGNOSIS — I4510 Unspecified right bundle-branch block: Secondary | ICD-10-CM | POA: Diagnosis not present

## 2022-08-05 DIAGNOSIS — J440 Chronic obstructive pulmonary disease with acute lower respiratory infection: Secondary | ICD-10-CM | POA: Diagnosis not present

## 2022-08-05 DIAGNOSIS — F17200 Nicotine dependence, unspecified, uncomplicated: Secondary | ICD-10-CM | POA: Diagnosis not present

## 2022-08-05 DIAGNOSIS — J9601 Acute respiratory failure with hypoxia: Secondary | ICD-10-CM | POA: Diagnosis not present

## 2022-08-05 DIAGNOSIS — Z7952 Long term (current) use of systemic steroids: Secondary | ICD-10-CM | POA: Diagnosis not present

## 2022-08-05 DIAGNOSIS — E860 Dehydration: Secondary | ICD-10-CM | POA: Diagnosis not present

## 2022-08-05 HISTORY — DX: Dependence on supplemental oxygen: Z99.81

## 2022-08-05 HISTORY — DX: Hypotension, unspecified: I95.9

## 2022-08-05 HISTORY — DX: Syncope and collapse: R55

## 2022-08-05 NOTE — Assessment & Plan Note (Signed)
Also could be contributing to syncopal episodes.  Recommend adjust medications.

## 2022-08-05 NOTE — Assessment & Plan Note (Signed)
Patient is currently on amiodarone patient was supposed to discontinue metoprolol and it is unclear if he did.  This was secondary to bradycardia which was identified at the hospital.

## 2022-08-05 NOTE — Assessment & Plan Note (Signed)
Multifactorial.  I do not believe is all vasovagal since he was still hypotensive upon arrival to our office.  Likely secondary to bradycardia as well. Referring to the emergency department

## 2022-08-05 NOTE — Progress Notes (Signed)
Subjective:  Patient ID: Kyle Farley, male    DOB: Jun 28, 1940  Age: 82 y.o. MRN: [id number]  Chief Complaint  Patient presents with   Hospitalization Follow-up    HPI   Kyle Farley comes in for hospital follow-up. He was admitted to [facility] on 08/01/2022 and discharged on 08/03/2022.  He was diagnosed with hyponatremia, multiple abrasions, syncope, alcohol abuse, and pneumonia. He has been using his oxygen 3L. Min at night and as needed.  Since being home from the hospital he has had two more syncopal episodes.  The most recent one was this morning around 9 a.m.  Generally they are preceded by a significant coughing episode and then he passes out for few seconds.  Patient reports he feels worse than when he was discharged yesterday.  He is having significant fatigue and shortness of breath.  His pulse ox was 85% here on room air.  We started him back on 3 L of oxygen and his oxygen improved to 94%.  In addition his blood pressure upon arrival was 82/42.  His baseline blood pressure is usually above 120 systolic.  Repeat 20 minutes later was 110/50.  EKG done showed his heart rate was 47.  I recommended go by ambulance but patient refused.  Since his blood pressure improved I did have him go by private vehicle.  We loaded him in and connected his portable oxygen that he had in the car.  His son was going to bring him straight over to [facility].     08/05/2022   11:08 AM 05/27/2022    4:12 PM 04/02/2022   11:24 AM 04/02/2022   10:41 AM 02/16/2022    2:56 PM  Depression screen PHQ 2/9  Decreased Interest 0 0 0 0 0  Down, Depressed, Hopeless 0 0 0 0 0  PHQ - 2 Score 0 0 0 0 0  Altered sleeping 0 0     Tired, decreased energy 0 2     Change in appetite 0 2     Feeling bad or failure about yourself  0 0     Trouble concentrating 0 0     Moving slowly or fidgety/restless 0 3     Suicidal thoughts 0 0     PHQ-9 Score 0 7     Difficult doing work/chores Not difficult at all Somewhat  difficult           08/05/2022   11:08 AM  Fall Risk   Falls in the past year? 1  Injury with Fall? 1  Risk for fall due to : History of fall(s);Impaired mobility;Impaired balance/gait    Patient Care Team: Blane Ohara, MD as PCP - General (Family Medicine) Regan Lemming, MD as Consulting Physician (Cardiology) Marlowe Sax, RN as Case Manager (General Practice) Baldo Daub, MD as Consulting Physician (Cardiology)   Review of Systems  Constitutional:  Positive for fatigue. Negative for chills and fever.  HENT:  Negative for congestion, rhinorrhea and sore throat.   Respiratory:  Positive for cough, chest tightness and shortness of breath.   Cardiovascular:  Negative for chest pain and palpitations.  Gastrointestinal:  Negative for abdominal pain, constipation, diarrhea, nausea and vomiting.  Genitourinary:  Negative for dysuria and urgency.  Musculoskeletal:  Negative for arthralgias, back pain and myalgias.  Neurological:  Positive for dizziness and syncope. Negative for headaches.  Psychiatric/Behavioral:  Negative for dysphoric mood. The patient is not nervous/anxious.     Current Outpatient Medications  on File Prior to Visit  Medication Sig Dispense Refill   amoxicillin-clavulanate (AUGMENTIN) 875-125 MG tablet Take 1 tablet by mouth 2 (two) times daily.     nitroGLYCERIN (NITROSTAT) 0.4 MG SL tablet Place 0.4 mg under the tongue every 5 (five) minutes as needed for chest pain.     thiamine (VITAMIN B1) 100 MG tablet Take 100 mg by mouth daily.     albuterol (VENTOLIN HFA) 108 (90 Base) MCG/ACT inhaler Inhale 2 puffs into the lungs every 4 (four) hours as needed for wheezing or shortness of breath. 48 each 2   amiodarone (PACERONE) 200 MG tablet Take 200 mg by mouth 2 (two) times daily.     apixaban (ELIQUIS) 5 MG TABS tablet Take 1 tablet (5 mg total) by mouth 2 (two) times daily. 180 tablet 3   atorvastatin (LIPITOR) 40 MG tablet Take 1 tablet (40 mg total) by  mouth daily. 90 tablet 3   diphenhydrAMINE HCl, Sleep, (ZZZQUIL PO) Take 1 tablet by mouth at bedtime.     empagliflozin (JARDIANCE) 10 MG TABS tablet Take 1 tablet (10 mg total) by mouth daily. 90 tablet 1   Fluticasone-Umeclidin-Vilant (TRELEGY ELLIPTA) 200-62.5-25 MCG/ACT AEPB INHALE 2 PUFFS ONCE DAILY 60 each 3   ipratropium-albuterol (DUONEB) 0.5-2.5 (3) MG/3ML SOLN Inhale 3 mLs into the lungs every 6 (six) hours as needed (For shortness of breath). 360 mL 1   levothyroxine (SYNTHROID) 50 MCG tablet Take 50 mcg by mouth daily before breakfast.     metoprolol succinate (TOPROL-XL) 50 MG 24 hr tablet Take 12.5 mg by mouth daily. Take with or immediately following a meal.     montelukast (SINGULAIR) 10 MG tablet Take 1 tablet (10 mg total) by mouth daily at 12 noon. 90 tablet 3   OXYGEN Inhale 3 L into the lungs as needed (shortness of breath).     pantoprazole (PROTONIX) 40 MG tablet Take 1 tablet (40 mg total) by mouth daily. 90 tablet 3   spironolactone (ALDACTONE) 25 MG tablet Take 1 tablet (25 mg total) by mouth daily. 90 tablet 1   tamsulosin (FLOMAX) 0.4 MG CAPS capsule Take 1 capsule (0.4 mg total) by mouth daily. 90 capsule 1   torsemide (DEMADEX) 20 MG tablet Take 20 mg by mouth 2 (two) times daily.     No current facility-administered medications on file prior to visit.   Past Medical History:  Diagnosis Date   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 (HCC) 11/10/2021   Acquired hypothyroidism 11/10/2021   Acute hypoxic respiratory failure (HCC) 04/25/2022   Acute on chronic diastolic heart failure (HCC) 04/25/2022   Acute on chronic systolic heart failure (HCC) 11/10/2021   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/10/2021   Alcohol use 09/12/2020   Anemia of chronic disease 04/25/2022   Arthritis    Atherosclerosis of native arteries of extremities with intermittent claudication, bilateral legs (HCC) 11/10/2021   Bilateral leg edema 02/16/2022   BPH  (benign prostatic hyperplasia) 11/10/2021   CHF (congestive heart failure) (HCC)    Chronic hyponatremia 04/25/2022   Chronic respiratory failure with hypoxia (HCC) 05/09/2022   Cigarette smoker 10/21/2014   Class 1 obesity 04/25/2022   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)    Dyspnea    Dysrhythmia    atrial fibrillation   Encounter for prostate cancer screening 02/16/2022   Encounter for screening for lung cancer 02/16/2022   Essential hypertension 10/21/2014   Femur fracture, right (HCC) 09/12/2020   GERD (gastroesophageal  reflux disease)    History of kidney stones    Hyperlipidemia 01/24/2017   Hypertension    Hyponatremia 04/26/2022   Kidney stones 01/24/2017   Paroxysmal atrial fibrillation (HCC) 10/21/2014   Pressure injury of skin 09/16/2020   SOB (shortness of breath) 05/10/2022   Tobacco abuse 02/16/2022   Past Surgical History:  Procedure Laterality Date   LEFT HEART CATH AND CORONARY ANGIOGRAPHY N/A 09/15/2020   Procedure: LEFT HEART CATH AND CORONARY ANGIOGRAPHY;  Surgeon: Tonny Bollman, MD;  Location: [facility] INVASIVE CV LAB;  Service: Cardiovascular;  Laterality: N/A;   LEG SURGERY Right    steal pin placed in right leg 35 years ago   ORIF FEMUR FRACTURE Right 09/15/2020   Procedure: OPEN REDUCTION INTERNAL FIXATION FEMORAL SHAFT FRACTURE;  Surgeon: Roby Lofts, MD;  Location: [facility] OR;  Service: Orthopedics;  Laterality: Right;    Family History  Problem Relation Age of Onset   Cancer Mother    Diabetes Mother    Breast cancer Sister    Social History   Socioeconomic History   Marital status: Widowed    Spouse name: Not on file   Number of children: 3   Years of education: Not on file   Highest education level: High school graduate  Occupational History   Occupation: retired  Tobacco Use   Smoking status: Every Day    Current packs/day: 1.00    Average packs/day: 1 pack/day for 62.0 years (62.0 ttl pk-yrs)    Types: Cigarettes, Cigars    Smokeless tobacco: Never  Vaping Use   Vaping status: Never Used  Substance and Sexual Activity   Alcohol use: Yes    Alcohol/week: 5.0 standard drinks of alcohol    Types: 5 Standard drinks or equivalent per week    Comment: 4-5 beers per day   Drug use: No   Sexual activity: Not Currently  Other Topics Concern   Not on file  Social History Narrative   Not on file   Social Determinants of Health   Financial Resource Strain: Low Risk  (04/26/2022)   Overall Financial Resource Strain (CARDIA)    Difficulty of Paying Living Expenses: Not very hard  Food Insecurity: No Food Insecurity (04/15/2022)   Hunger Vital Sign    Worried About Running Out of Food in the Last Year: Never true    Ran Out of Food in the Last Year: Never true  Transportation Needs: No Transportation Needs (04/26/2022)   PRAPARE - Administrator, Civil Service (Medical): No    Lack of Transportation (Non-Medical): No  Physical Activity: Inactive (04/15/2022)   Exercise Vital Sign    Days of Exercise per Week: 0 days    Minutes of Exercise per Session: 0 min  Stress: No Stress Concern Present (04/15/2022)   [employer] of Occupational Health - Occupational Stress Questionnaire    Feeling of Stress : Not at all  Social Connections: Moderately Isolated (04/15/2022)   Social Connection and Isolation Panel [NHANES]    Frequency of Communication with Friends and Family: More than three times a week    Frequency of Social Gatherings with Friends and Family: More than three times a week    Attends Religious Services: More than 4 times per year    Active Member of [employer] or Organizations: No    Attends Banker Meetings: Never    Marital Status: Widowed    Objective:  BP (!) 110/50   Pulse 60   Temp (!)  97.3 F (36.3 C)   Resp 18   Ht 5\' 9"  (1.753 m)   Wt 214 lb (97.1 kg)   SpO2 94% Comment: 3L  BMI 31.60 kg/m      08/05/2022   11:36 AM 08/05/2022   11:02 AM 06/15/2022    9:30 AM   BP/Weight  Systolic BP 110 82 120  Diastolic BP 50 42 60  Wt. (Lbs)  214 203  BMI  31.6 kg/m2 29.98 kg/m2    Physical Exam  Diabetic Foot Exam - Simple   No data filed      Lab Results  Component Value Date   WBC 10.7 07/05/2022   HGB 11.6 (L) 07/05/2022   HCT 36.1 (L) 07/05/2022   PLT 264 07/05/2022   GLUCOSE 98 06/24/2022   CHOL 188 10/14/2021   TRIG 93 10/14/2021   HDL 92 10/14/2021   LDLCALC 80 10/14/2021   ALT 17 06/24/2022   AST 16 06/24/2022   NA 138 06/24/2022   K 5.4 (H) 06/24/2022   CL 99 06/24/2022   CREATININE 1.00 06/24/2022   BUN 17 06/24/2022   CO2 28 06/24/2022   TSH 3.770 05/05/2022   HGBA1C 6.5 (H) 05/27/2022      Assessment & Plan:    Syncope, unspecified syncope type Assessment & Plan: Likely vasovagal although hyponatremia could certainly contribute as well as hypotension.  Although the history is it consistent with vasovagal episode his blood pressure was quite low when he arrived anyways and this was approximately an hour after the episode. Refer back to the emergency department.  Patient chose to go by private vehicle AGAINST MEDICAL ADVICE.   Hypotension, unspecified hypotension type Assessment & Plan: Multifactorial.  I do not believe is all vasovagal since he was still hypotensive upon arrival to our office.  Likely secondary to bradycardia as well. Referring to the emergency department   Pneumonia of right lower lobe due to infectious organism Assessment & Plan: Needs to complete Augmentin.   Hyponatremia Assessment & Plan: This could certainly contribute to syncopal episodes and confusion which his son reports has had some mild confusion.  Obviously this is likely secondary to his beer intake.   Bradycardia Assessment & Plan: Also could be contributing to syncopal episodes.  Recommend adjust medications.   Oxygen dependent  Acute on chronic respiratory failure with hypoxia (HCC) Assessment & Plan: Otherwise hypoxia  improved with 3 L of oxygen.  I am concerned he was discharged just a little too early from the hospital.  Especially since objectively he feels significantly worse.   Paroxysmal atrial fibrillation [facility]) Assessment & Plan: Patient is currently on amiodarone patient was supposed to discontinue metoprolol and it is unclear if he did.  This was secondary to bradycardia which was identified at the hospital.      No orders of the defined types were placed in this encounter.   No orders of the defined types were placed in this encounter.    Follow-up: No follow-ups on file.   I,Carolyn M Morrison,acting as a Neurosurgeon for Blane Ohara, MD.,have documented all relevant documentation on the behalf of Blane Ohara, MD,as directed by  Blane Ohara, MD while in the presence of Blane Ohara, MD.   An After Visit Summary was printed and given to the patient.  Blane Ohara, MD [facility] [phone]

## 2022-08-05 NOTE — Assessment & Plan Note (Signed)
This could certainly contribute to syncopal episodes and confusion which his son reports has had some mild confusion.  Obviously this is likely secondary to his beer intake.

## 2022-08-05 NOTE — Assessment & Plan Note (Signed)
Likely vasovagal although hyponatremia could certainly contribute as well as hypotension.  Although the history is it consistent with vasovagal episode his blood pressure was quite low when he arrived anyways and this was approximately an hour after the episode. Refer back to the emergency department.  Patient chose to go by private vehicle AGAINST MEDICAL ADVICE.

## 2022-08-05 NOTE — Assessment & Plan Note (Signed)
Otherwise hypoxia improved with 3 L of oxygen.  I am concerned he was discharged just a little too early from the hospital.  Especially since objectively he feels significantly worse.

## 2022-08-05 NOTE — Telephone Encounter (Signed)
Dr. Sedalia Muta,   When and where can we schedule this patient?  Patient notified that we do not have an opening within the timeframe that he needs to be seen and will follow-up with the provider for further advisement.  Hospital: [facility] Admitted: 08/01/2022 Discharged: 08/03/2022 Follow-up within: 1 week Diagnosed: low sodium and a spot on his lungs that they thought was pneumonia.   The patient stated that he is not feeling good and is requesting to be seen either today or tomorrow.  [place]

## 2022-08-05 NOTE — Assessment & Plan Note (Signed)
Needs to complete Augmentin.

## 2022-08-05 NOTE — Telephone Encounter (Signed)
Per Dr. Sedalia Muta ok to see the patient today at 11:20. Patients son was notified. Told to arrive by 11:05 AM.

## 2022-08-06 DIAGNOSIS — R001 Bradycardia, unspecified: Secondary | ICD-10-CM | POA: Diagnosis not present

## 2022-08-06 DIAGNOSIS — I350 Nonrheumatic aortic (valve) stenosis: Secondary | ICD-10-CM | POA: Diagnosis not present

## 2022-08-06 DIAGNOSIS — I495 Sick sinus syndrome: Secondary | ICD-10-CM | POA: Diagnosis not present

## 2022-08-06 DIAGNOSIS — R079 Chest pain, unspecified: Secondary | ICD-10-CM | POA: Diagnosis not present

## 2022-08-06 DIAGNOSIS — I480 Paroxysmal atrial fibrillation: Secondary | ICD-10-CM | POA: Diagnosis not present

## 2022-08-06 DIAGNOSIS — J189 Pneumonia, unspecified organism: Secondary | ICD-10-CM | POA: Diagnosis not present

## 2022-08-06 DIAGNOSIS — J449 Chronic obstructive pulmonary disease, unspecified: Secondary | ICD-10-CM | POA: Diagnosis not present

## 2022-08-06 DIAGNOSIS — F17200 Nicotine dependence, unspecified, uncomplicated: Secondary | ICD-10-CM | POA: Diagnosis not present

## 2022-08-06 DIAGNOSIS — Z7901 Long term (current) use of anticoagulants: Secondary | ICD-10-CM | POA: Diagnosis not present

## 2022-08-07 DIAGNOSIS — J449 Chronic obstructive pulmonary disease, unspecified: Secondary | ICD-10-CM | POA: Diagnosis not present

## 2022-08-07 DIAGNOSIS — Z7901 Long term (current) use of anticoagulants: Secondary | ICD-10-CM | POA: Diagnosis not present

## 2022-08-07 DIAGNOSIS — J189 Pneumonia, unspecified organism: Secondary | ICD-10-CM | POA: Diagnosis not present

## 2022-08-07 DIAGNOSIS — I495 Sick sinus syndrome: Secondary | ICD-10-CM | POA: Diagnosis not present

## 2022-08-07 DIAGNOSIS — F17200 Nicotine dependence, unspecified, uncomplicated: Secondary | ICD-10-CM | POA: Diagnosis not present

## 2022-08-07 DIAGNOSIS — I480 Paroxysmal atrial fibrillation: Secondary | ICD-10-CM | POA: Diagnosis not present

## 2022-08-07 DIAGNOSIS — R001 Bradycardia, unspecified: Secondary | ICD-10-CM | POA: Diagnosis not present

## 2022-08-07 DIAGNOSIS — R079 Chest pain, unspecified: Secondary | ICD-10-CM | POA: Diagnosis not present

## 2022-08-07 DIAGNOSIS — I350 Nonrheumatic aortic (valve) stenosis: Secondary | ICD-10-CM | POA: Diagnosis not present

## 2022-08-09 ENCOUNTER — Telehealth: Payer: Self-pay

## 2022-08-09 NOTE — Telephone Encounter (Signed)
  Transition Care Management Follow-up Telephone Call    Kyle Farley 18-Mar-1940  Admit Date:08/05/2022 Discharge Date: 08/07/2022 Discharged from where: [facility]   Diagnoses: Smoker, Bradycardia, pneumonia, syncope, alcoholism and copd.    2 day post discharge: 08/09/2022   Kyle Farley was discharged from [facility]  on 08/07/2022  with the diagnoses listed above.  He was contacted today via telephone in regards to transition of care.     Discharge Instructions: Prednisone 40 mg daily for 10 days.  Zithromax 500 mg daily for 4 days.  Guaifenesin 1200 mg twice daily and benzonatate 200 mg every 8 hours as needed for cough.    Items Reviewed: Did the pt receive and understand the discharge instructions provided? Yes  Medications obtained and verified? Yes  Other? No  Any new allergies since your discharge? No  Dietary orders reviewed? Yes Do you have support at home? Yes   Home Care and Equipment/Supplies: Were home health services ordered?  Yes If so, what is the name of the agency? [facility].   Has the agency set up a time to come to the patient's home? no Were any new equipment or medical supplies ordered?  No What is the name of the medical supply agency?  Were you able to get the supplies/equipment? not applicable Do you have any questions related to the use of the equipment or supplies? No  Functional Questionnaire: (I = Independent and D = Dependent) ADLs:   Bathing/Dressing- independent  Meal Prep- D (family assisting)    Eating- independent  Maintaining continence-  I   Transferring/Ambulation- I  Managing Meds- D  Any patient concerns? no  Follow up appointments reviewed: PCP Hospital f/u appt confirmed? Yes  Scheduled to se  on 08/11/2022 @ 1:30 with Dr. Blane Ohara.   Specialist Hospital f/u appt confirmed? No   Are transportation arrangements needed? No  If their condition worsens, is the pt  aware to call PCP or go to the Emergency Dept.? Yes Was the patient provided with contact information for the PCP's office/after hours number? Yes Was to pt encouraged to call back with questions or concerns? Yes    08/09/22 4:22 PM

## 2022-08-11 ENCOUNTER — Encounter: Payer: Self-pay | Admitting: Family Medicine

## 2022-08-11 ENCOUNTER — Ambulatory Visit (INDEPENDENT_AMBULATORY_CARE_PROVIDER_SITE_OTHER): Payer: Medicare HMO | Admitting: Family Medicine

## 2022-08-11 VITALS — BP 130/62 | HR 83 | Temp 97.3°F | Ht 69.0 in | Wt 213.0 lb

## 2022-08-11 DIAGNOSIS — F17200 Nicotine dependence, unspecified, uncomplicated: Secondary | ICD-10-CM

## 2022-08-11 DIAGNOSIS — J189 Pneumonia, unspecified organism: Secondary | ICD-10-CM | POA: Diagnosis not present

## 2022-08-11 DIAGNOSIS — R001 Bradycardia, unspecified: Secondary | ICD-10-CM | POA: Diagnosis not present

## 2022-08-11 DIAGNOSIS — E782 Mixed hyperlipidemia: Secondary | ICD-10-CM | POA: Diagnosis not present

## 2022-08-11 DIAGNOSIS — D649 Anemia, unspecified: Secondary | ICD-10-CM | POA: Diagnosis not present

## 2022-08-11 DIAGNOSIS — R55 Syncope and collapse: Secondary | ICD-10-CM | POA: Diagnosis not present

## 2022-08-11 NOTE — Progress Notes (Unsigned)
Subjective:  Patient ID: Kyle Farley, male    DOB: January 16, 1941  Age: 82 y.o. MRN: [id number]  Chief Complaint  Patient presents with   Hospitalization Follow-up    HPI Patient presents today for hospital follow up, admitted to [facility] 7/18 and d/c 7/20. Treated for Bradycardia, pneumonia, syncope, alcoholism and copd with acute exacerbation. Sent home with prednisone 40 mg daily x 10 days, Zithromax 500 mg daily x 4 days, Mucinex 1200 mg BID, Tessalon pearls. Patient states he is feeling better!     08/05/2022   11:08 AM 05/27/2022    4:12 PM 04/02/2022   11:24 AM 04/02/2022   10:41 AM 02/16/2022    2:56 PM  Depression screen PHQ 2/9  Decreased Interest 0 0 0 0 0  Down, Depressed, Hopeless 0 0 0 0 0  PHQ - 2 Score 0 0 0 0 0  Altered sleeping 0 0     Tired, decreased energy 0 2     Change in appetite 0 2     Feeling bad or failure about yourself  0 0     Trouble concentrating 0 0     Moving slowly or fidgety/restless 0 3     Suicidal thoughts 0 0     PHQ-9 Score 0 7     Difficult doing work/chores Not difficult at all Somewhat difficult           08/05/2022   11:08 AM  Fall Risk   Falls in the past year? 1  Injury with Fall? 1  Risk for fall due to : History of fall(s);Impaired mobility;Impaired balance/gait    Patient Care Team: Blane Ohara, MD as PCP - General (Family Medicine) Regan Lemming, MD as Consulting Physician (Cardiology) Marlowe Sax, RN as Case Manager (General Practice) Baldo Daub, MD as Consulting Physician (Cardiology)   Review of Systems  Constitutional:  Negative for chills, diaphoresis, fatigue and fever.  HENT:  Negative for congestion, ear pain and sore throat.   Respiratory:  Positive for shortness of breath. Negative for cough.   Cardiovascular:  Negative for chest pain and leg swelling.  Gastrointestinal:  Negative for abdominal pain, constipation, diarrhea, nausea and vomiting.  Genitourinary:  Negative for dysuria and urgency.   Musculoskeletal:  Negative for arthralgias and myalgias.  Neurological:  Negative for dizziness and headaches.  Psychiatric/Behavioral:  Negative for dysphoric mood.     Current Outpatient Medications on File Prior to Visit  Medication Sig Dispense Refill   amLODipine (NORVASC) 5 MG tablet Take 5 mg by mouth daily.     benzonatate (TESSALON) 100 MG capsule SMARTSIG:2 Capsule(s) By Mouth Every 8 Hours PRN     cetirizine (ZYRTEC) 10 MG tablet Take 10 mg by mouth at bedtime.     EQ MUCUS RELIEF 12 HOUR MAX ST 1200 MG TB12 Take 1 tablet by mouth 2 (two) times daily.     predniSONE (DELTASONE) 20 MG tablet Take 40 mg by mouth daily.     thiamine (VITAMIN B1) 100 MG tablet Take 100 mg by mouth daily.     albuterol (VENTOLIN HFA) 108 (90 Base) MCG/ACT inhaler Inhale 2 puffs into the lungs every 4 (four) hours as needed for wheezing or shortness of breath. 48 each 2   amiodarone (PACERONE) 200 MG tablet Take 200 mg by mouth 2 (two) times daily.     apixaban (ELIQUIS) 5 MG TABS tablet Take 1 tablet (5 mg total) by mouth 2 (two) times daily. 180  tablet 3   atorvastatin (LIPITOR) 40 MG tablet Take 1 tablet (40 mg total) by mouth daily. 90 tablet 3   diphenhydrAMINE HCl, Sleep, (ZZZQUIL PO) Take 1 tablet by mouth at bedtime.     empagliflozin (JARDIANCE) 10 MG TABS tablet Take 1 tablet (10 mg total) by mouth daily. 90 tablet 1   Fluticasone-Umeclidin-Vilant (TRELEGY ELLIPTA) 200-62.5-25 MCG/ACT AEPB INHALE 2 PUFFS ONCE DAILY 60 each 3   ipratropium-albuterol (DUONEB) 0.5-2.5 (3) MG/3ML SOLN Inhale 3 mLs into the lungs every 6 (six) hours as needed (For shortness of breath). 360 mL 1   levothyroxine (SYNTHROID) 50 MCG tablet Take 50 mcg by mouth daily before breakfast.     metoprolol succinate (TOPROL-XL) 50 MG 24 hr tablet Take 12.5 mg by mouth daily. Take with or immediately following a meal.     montelukast (SINGULAIR) 10 MG tablet Take 1 tablet (10 mg total) by mouth daily at 12 noon. 90 tablet 3    nitroGLYCERIN (NITROSTAT) 0.4 MG SL tablet Place 0.4 mg under the tongue every 5 (five) minutes as needed for chest pain.     OXYGEN Inhale 3 L into the lungs as needed (shortness of breath).     pantoprazole (PROTONIX) 40 MG tablet Take 1 tablet (40 mg total) by mouth daily. 90 tablet 3   spironolactone (ALDACTONE) 25 MG tablet Take 1 tablet (25 mg total) by mouth daily. 90 tablet 1   tamsulosin (FLOMAX) 0.4 MG CAPS capsule Take 1 capsule (0.4 mg total) by mouth daily. 90 capsule 1   thiamine (VITAMIN B1) 100 MG tablet Take 100 mg by mouth daily.     torsemide (DEMADEX) 20 MG tablet Take 20 mg by mouth 2 (two) times daily.     No current facility-administered medications on file prior to visit.   Past Medical History:  Diagnosis Date   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 (HCC) 11/10/2021   Acquired hypothyroidism 11/10/2021   Acute hypoxic respiratory failure (HCC) 04/25/2022   Acute on chronic diastolic heart failure (HCC) 04/25/2022   Acute on chronic systolic heart failure (HCC) 11/10/2021   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/10/2021   Alcohol use 09/12/2020   Anemia of chronic disease 04/25/2022   Arthritis    Atherosclerosis of native arteries of extremities with intermittent claudication, bilateral legs (HCC) 11/10/2021   Bilateral leg edema 02/16/2022   BPH (benign prostatic hyperplasia) 11/10/2021   CHF (congestive heart failure) (HCC)    Chronic hyponatremia 04/25/2022   Chronic respiratory failure with hypoxia (HCC) 05/09/2022   Cigarette smoker 10/21/2014   Class 1 obesity 04/25/2022   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)    Dyspnea    Dysrhythmia    atrial fibrillation   Encounter for prostate cancer screening 02/16/2022   Encounter for screening for lung cancer 02/16/2022   Essential hypertension 10/21/2014   Femur fracture, right (HCC) 09/12/2020   GERD (gastroesophageal reflux disease)    History of kidney stones     Hyperlipidemia 01/24/2017   Hypertension    Hyponatremia 04/26/2022   Kidney stones 01/24/2017   Paroxysmal atrial fibrillation (HCC) 10/21/2014   Pressure injury of skin 09/16/2020   SOB (shortness of breath) 05/10/2022   Tobacco abuse 02/16/2022   Past Surgical History:  Procedure Laterality Date   LEFT HEART CATH AND CORONARY ANGIOGRAPHY N/A 09/15/2020   Procedure: LEFT HEART CATH AND CORONARY ANGIOGRAPHY;  Surgeon: Tonny Bollman, MD;  Location: [facility] INVASIVE CV LAB;  Service: Cardiovascular;  Laterality: N/A;  LEG SURGERY Right    steal pin placed in right leg 35 years ago   ORIF FEMUR FRACTURE Right 09/15/2020   Procedure: OPEN REDUCTION INTERNAL FIXATION FEMORAL SHAFT FRACTURE;  Surgeon: Roby Lofts, MD;  Location: [facility] OR;  Service: Orthopedics;  Laterality: Right;    Family History  Problem Relation Age of Onset   Cancer Mother    Diabetes Mother    Breast cancer Sister    Social History   Socioeconomic History   Marital status: Widowed    Spouse name: Not on file   Number of children: 3   Years of education: Not on file   Highest education level: High school graduate  Occupational History   Occupation: retired  Tobacco Use   Smoking status: Every Day    Current packs/day: 1.00    Average packs/day: 1 pack/day for 62.0 years (62.0 ttl pk-yrs)    Types: Cigarettes, Cigars   Smokeless tobacco: Never  Vaping Use   Vaping status: Never Used  Substance and Sexual Activity   Alcohol use: Yes    Alcohol/week: 5.0 standard drinks of alcohol    Types: 5 Standard drinks or equivalent per week    Comment: 4-5 beers per day   Drug use: No   Sexual activity: Not Currently  Other Topics Concern   Not on file  Social History Narrative   Not on file   Social Determinants of Health   Financial Resource Strain: Low Risk  (04/26/2022)   Overall Financial Resource Strain (CARDIA)    Difficulty of Paying Living Expenses: Not very hard  Food Insecurity: No Food Insecurity  (04/15/2022)   Hunger Vital Sign    Worried About Running Out of Food in the Last Year: Never true    Ran Out of Food in the Last Year: Never true  Transportation Needs: No Transportation Needs (04/26/2022)   PRAPARE - Administrator, Civil Service (Medical): No    Lack of Transportation (Non-Medical): No  Physical Activity: Inactive (04/15/2022)   Exercise Vital Sign    Days of Exercise per Week: 0 days    Minutes of Exercise per Session: 0 min  Stress: No Stress Concern Present (04/15/2022)   [employer] of Occupational Health - Occupational Stress Questionnaire    Feeling of Stress : Not at all  Social Connections: Moderately Isolated (04/15/2022)   Social Connection and Isolation Panel [NHANES]    Frequency of Communication with Friends and Family: More than three times a week    Frequency of Social Gatherings with Friends and Family: More than three times a week    Attends Religious Services: More than 4 times per year    Active Member of [employer] or Organizations: No    Attends Banker Meetings: Never    Marital Status: Widowed    Objective:  BP 130/62   Pulse 83   Temp (!) 97.3 F (36.3 C)   Ht 5\' 9"  (1.753 m)   Wt 213 lb (96.6 kg)   SpO2 93%   BMI 31.45 kg/m      08/11/2022    1:22 PM 08/05/2022   11:36 AM 08/05/2022   11:02 AM  BP/Weight  Systolic BP 130 110 82  Diastolic BP 62 50 42  Wt. (Lbs) 213  214  BMI 31.45 kg/m2  31.6 kg/m2    Physical Exam Vitals reviewed.  Constitutional:      Appearance: Normal appearance. He is normal weight.  Cardiovascular:  Rate and Rhythm: Normal rate and regular rhythm.     Heart sounds: No murmur heard. Pulmonary:     Effort: Pulmonary effort is normal.     Breath sounds: Normal breath sounds.  Abdominal:     General: Abdomen is flat. Bowel sounds are normal.     Palpations: Abdomen is soft.     Tenderness: There is no abdominal tenderness.  Neurological:     Mental Status: He is alert and  oriented to person, place, and time.  Psychiatric:        Mood and Affect: Mood normal.        Behavior: Behavior normal.     Diabetic Foot Exam - Simple   No data filed      Lab Results  Component Value Date   WBC 12.5 (H) 08/11/2022   HGB 10.8 (L) 08/11/2022   HCT 35.1 (L) 08/11/2022   PLT 328 08/11/2022   GLUCOSE 113 (H) 08/11/2022   CHOL 188 10/14/2021   TRIG 93 10/14/2021   HDL 92 10/14/2021   LDLCALC 80 10/14/2021   ALT 21 08/11/2022   AST 16 08/11/2022   NA 137 08/11/2022   K 6.3 (HH) 08/11/2022   CL 100 08/11/2022   CREATININE 1.24 08/11/2022   BUN 34 (H) 08/11/2022   CO2 24 08/11/2022   TSH 3.770 05/05/2022   HGBA1C 6.5 (H) 05/27/2022      Assessment & Plan:    Pneumonia, organism unspecified(486) Assessment & Plan: Recommended to finish antibiotics and continue with mucinex and tessalon pearls.   Mixed hyperlipidemia Assessment & Plan: Well controlled.  No changes to medicines. Atorvastatin 40 mg daily Continue to work on eating a healthy diet and exercise.  Labs drawn today.    Orders: -     CBC with Differential/Platelet -     Comprehensive metabolic panel -     VITAMIN D 25 Hydroxy (Vit-D Deficiency, Fractures)  Tobacco use disorder Assessment & Plan: Patient said he is smoking 50 yrs and declined to quit   Bradycardia Assessment & Plan: Stable   Syncope and collapse Assessment & Plan: Check labs      No orders of the defined types were placed in this encounter.   Orders Placed This Encounter  Procedures   CBC with Differential/Platelet   Comprehensive metabolic panel   VITAMIN D 25 Hydroxy (Vit-D Deficiency, Fractures)     Follow-up: No follow-ups on file.   I,Katherina A Bramblett,acting as a scribe for Blane Ohara, MD.,have documented all relevant documentation on the behalf of Blane Ohara, MD,as directed by  Blane Ohara, MD while in the presence of Blane Ohara, MD.   Clayborn Bigness I Leal-Borjas,acting as a scribe for  Blane Ohara, MD.,have documented all relevant documentation on the behalf of Blane Ohara, MD,as directed by  Blane Ohara, MD while in the presence of Blane Ohara, MD.    An After Visit Summary was printed and given to the patient.  Blane Ohara, MD [facility] [phone]

## 2022-08-12 DIAGNOSIS — J441 Chronic obstructive pulmonary disease with (acute) exacerbation: Secondary | ICD-10-CM | POA: Diagnosis not present

## 2022-08-12 DIAGNOSIS — J440 Chronic obstructive pulmonary disease with acute lower respiratory infection: Secondary | ICD-10-CM | POA: Diagnosis not present

## 2022-08-12 DIAGNOSIS — Z7984 Long term (current) use of oral hypoglycemic drugs: Secondary | ICD-10-CM | POA: Diagnosis not present

## 2022-08-12 DIAGNOSIS — F17210 Nicotine dependence, cigarettes, uncomplicated: Secondary | ICD-10-CM | POA: Diagnosis not present

## 2022-08-12 DIAGNOSIS — E875 Hyperkalemia: Secondary | ICD-10-CM | POA: Diagnosis not present

## 2022-08-12 DIAGNOSIS — Z6831 Body mass index (BMI) 31.0-31.9, adult: Secondary | ICD-10-CM | POA: Diagnosis not present

## 2022-08-12 DIAGNOSIS — N400 Enlarged prostate without lower urinary tract symptoms: Secondary | ICD-10-CM | POA: Diagnosis not present

## 2022-08-12 DIAGNOSIS — J188 Other pneumonia, unspecified organism: Secondary | ICD-10-CM | POA: Diagnosis not present

## 2022-08-12 DIAGNOSIS — I714 Abdominal aortic aneurysm, without rupture, unspecified: Secondary | ICD-10-CM | POA: Diagnosis not present

## 2022-08-12 DIAGNOSIS — Z9981 Dependence on supplemental oxygen: Secondary | ICD-10-CM | POA: Diagnosis not present

## 2022-08-12 DIAGNOSIS — E669 Obesity, unspecified: Secondary | ICD-10-CM | POA: Diagnosis not present

## 2022-08-12 DIAGNOSIS — E871 Hypo-osmolality and hyponatremia: Secondary | ICD-10-CM | POA: Diagnosis not present

## 2022-08-12 DIAGNOSIS — E119 Type 2 diabetes mellitus without complications: Secondary | ICD-10-CM | POA: Diagnosis not present

## 2022-08-12 DIAGNOSIS — J9621 Acute and chronic respiratory failure with hypoxia: Secondary | ICD-10-CM | POA: Diagnosis not present

## 2022-08-12 DIAGNOSIS — I959 Hypotension, unspecified: Secondary | ICD-10-CM | POA: Diagnosis not present

## 2022-08-12 DIAGNOSIS — F10239 Alcohol dependence with withdrawal, unspecified: Secondary | ICD-10-CM | POA: Diagnosis not present

## 2022-08-12 DIAGNOSIS — D649 Anemia, unspecified: Secondary | ICD-10-CM | POA: Diagnosis not present

## 2022-08-12 DIAGNOSIS — Z7901 Long term (current) use of anticoagulants: Secondary | ICD-10-CM | POA: Diagnosis not present

## 2022-08-12 DIAGNOSIS — I5032 Chronic diastolic (congestive) heart failure: Secondary | ICD-10-CM | POA: Diagnosis not present

## 2022-08-12 DIAGNOSIS — I480 Paroxysmal atrial fibrillation: Secondary | ICD-10-CM | POA: Diagnosis not present

## 2022-08-12 DIAGNOSIS — E785 Hyperlipidemia, unspecified: Secondary | ICD-10-CM | POA: Diagnosis not present

## 2022-08-12 DIAGNOSIS — Z7951 Long term (current) use of inhaled steroids: Secondary | ICD-10-CM | POA: Diagnosis not present

## 2022-08-12 LAB — HEPATIC FUNCTION PANEL
ALT: 24 U/L (ref 10–40)
AST: 26 (ref 14–40)
Alkaline Phosphatase: 62 (ref 25–125)

## 2022-08-12 LAB — CBC WITH DIFFERENTIAL/PLATELET
Basophils Absolute: 0 10*3/uL (ref 0.0–0.2)
Basos: 0 %
EOS (ABSOLUTE): 0 10*3/uL (ref 0.0–0.4)
Hemoglobin: 10.8 g/dL — ABNORMAL LOW (ref 13.0–17.7)
Immature Grans (Abs): 0.5 10*3/uL — ABNORMAL HIGH (ref 0.0–0.1)
Immature Granulocytes: 4 %
Lymphocytes Absolute: 0.6 10*3/uL — ABNORMAL LOW (ref 0.7–3.1)
Lymphs: 5 %
MCH: 26.1 pg — ABNORMAL LOW (ref 26.6–33.0)
Monocytes Absolute: 0.2 10*3/uL (ref 0.1–0.9)
Monocytes: 2 %
Neutrophils Absolute: 11.1 10*3/uL — ABNORMAL HIGH (ref 1.4–7.0)
Neutrophils: 89 %
Platelets: 328 10*3/uL (ref 150–450)
RDW: 15.4 % (ref 11.6–15.4)
WBC: 12.5 10*3/uL — ABNORMAL HIGH (ref 3.4–10.8)

## 2022-08-12 LAB — BASIC METABOLIC PANEL
BUN: 28 — AB (ref 4–21)
CO2: 23 — AB (ref 13–22)
Chloride: 102 (ref 99–108)
Creatinine: 1.1 (ref 0.6–1.3)
Glucose: 119
Potassium: 5.4 mEq/L — AB (ref 3.5–5.1)
Sodium: 135 — AB (ref 137–147)

## 2022-08-12 LAB — COMPREHENSIVE METABOLIC PANEL
ALT: 21 IU/L (ref 0–44)
AST: 16 IU/L (ref 0–40)
Albumin: 3.9 (ref 3.5–5.0)
Alkaline Phosphatase: 73 IU/L (ref 44–121)
BUN/Creatinine Ratio: 27 — ABNORMAL HIGH (ref 10–24)
BUN: 34 mg/dL — ABNORMAL HIGH (ref 8–27)
Bilirubin Total: 0.3 mg/dL (ref 0.0–1.2)
Calcium: 8.9 (ref 8.7–10.7)
Chloride: 100 mmol/L (ref 96–106)
Globulin, Total: 2.8 g/dL (ref 1.5–4.5)
Glucose: 113 mg/dL — ABNORMAL HIGH (ref 70–99)
Potassium: 6.3 mmol/L (ref 3.5–5.2)
Sodium: 137 mmol/L (ref 134–144)
eGFR: 58 mL/min/{1.73_m2} — ABNORMAL LOW (ref >59)

## 2022-08-12 LAB — CBC AND DIFFERENTIAL
HCT: 34 — AB (ref 41–53)
Hemoglobin: 10.9 — AB (ref 13.5–17.5)
Platelets: 329 10*3/uL (ref 150–400)
WBC: 10.9

## 2022-08-12 LAB — LITHOLINK CKD PROGRAM

## 2022-08-12 LAB — VITAMIN D 25 HYDROXY (VIT D DEFICIENCY, FRACTURES): Vit D, 25-Hydroxy: 16.6 ng/mL — ABNORMAL LOW (ref 30.0–100.0)

## 2022-08-12 LAB — CBC: RBC: 4.04 (ref 3.87–5.11)

## 2022-08-14 NOTE — Assessment & Plan Note (Signed)
Recommended to finish antibiotics and continue with mucinex and tessalon pearls.

## 2022-08-14 NOTE — Assessment & Plan Note (Signed)
Stable

## 2022-08-14 NOTE — Assessment & Plan Note (Signed)
Patient said he is smoking 50 yrs and declined to quit

## 2022-08-14 NOTE — Assessment & Plan Note (Signed)
Check labs 

## 2022-08-14 NOTE — Assessment & Plan Note (Signed)
Well controlled.  No changes to medicines. Atorvastatin 40 mg daily Continue to work on eating a healthy diet and exercise.  Labs drawn today.   

## 2022-08-16 ENCOUNTER — Encounter: Payer: Self-pay | Admitting: Family Medicine

## 2022-08-16 DIAGNOSIS — J449 Chronic obstructive pulmonary disease, unspecified: Secondary | ICD-10-CM | POA: Diagnosis not present

## 2022-08-17 ENCOUNTER — Telehealth: Payer: Medicare HMO

## 2022-08-17 ENCOUNTER — Telehealth: Payer: Self-pay | Admitting: Family Medicine

## 2022-08-17 ENCOUNTER — Ambulatory Visit (INDEPENDENT_AMBULATORY_CARE_PROVIDER_SITE_OTHER): Payer: Medicare HMO

## 2022-08-17 DIAGNOSIS — E669 Obesity, unspecified: Secondary | ICD-10-CM | POA: Diagnosis not present

## 2022-08-17 DIAGNOSIS — J9621 Acute and chronic respiratory failure with hypoxia: Secondary | ICD-10-CM | POA: Diagnosis not present

## 2022-08-17 DIAGNOSIS — J188 Other pneumonia, unspecified organism: Secondary | ICD-10-CM | POA: Diagnosis not present

## 2022-08-17 DIAGNOSIS — Z7984 Long term (current) use of oral hypoglycemic drugs: Secondary | ICD-10-CM | POA: Diagnosis not present

## 2022-08-17 DIAGNOSIS — I5023 Acute on chronic systolic (congestive) heart failure: Secondary | ICD-10-CM

## 2022-08-17 DIAGNOSIS — Z7901 Long term (current) use of anticoagulants: Secondary | ICD-10-CM | POA: Diagnosis not present

## 2022-08-17 DIAGNOSIS — Z9981 Dependence on supplemental oxygen: Secondary | ICD-10-CM | POA: Diagnosis not present

## 2022-08-17 DIAGNOSIS — I480 Paroxysmal atrial fibrillation: Secondary | ICD-10-CM

## 2022-08-17 DIAGNOSIS — I714 Abdominal aortic aneurysm, without rupture, unspecified: Secondary | ICD-10-CM | POA: Diagnosis not present

## 2022-08-17 DIAGNOSIS — E785 Hyperlipidemia, unspecified: Secondary | ICD-10-CM | POA: Diagnosis not present

## 2022-08-17 DIAGNOSIS — N400 Enlarged prostate without lower urinary tract symptoms: Secondary | ICD-10-CM | POA: Diagnosis not present

## 2022-08-17 DIAGNOSIS — I959 Hypotension, unspecified: Secondary | ICD-10-CM | POA: Diagnosis not present

## 2022-08-17 DIAGNOSIS — I10 Essential (primary) hypertension: Secondary | ICD-10-CM

## 2022-08-17 DIAGNOSIS — J440 Chronic obstructive pulmonary disease with acute lower respiratory infection: Secondary | ICD-10-CM | POA: Diagnosis not present

## 2022-08-17 DIAGNOSIS — D649 Anemia, unspecified: Secondary | ICD-10-CM | POA: Diagnosis not present

## 2022-08-17 DIAGNOSIS — Z7951 Long term (current) use of inhaled steroids: Secondary | ICD-10-CM | POA: Diagnosis not present

## 2022-08-17 DIAGNOSIS — I5032 Chronic diastolic (congestive) heart failure: Secondary | ICD-10-CM | POA: Diagnosis not present

## 2022-08-17 DIAGNOSIS — J441 Chronic obstructive pulmonary disease with (acute) exacerbation: Secondary | ICD-10-CM | POA: Diagnosis not present

## 2022-08-17 DIAGNOSIS — F17210 Nicotine dependence, cigarettes, uncomplicated: Secondary | ICD-10-CM | POA: Diagnosis not present

## 2022-08-17 DIAGNOSIS — Z6831 Body mass index (BMI) 31.0-31.9, adult: Secondary | ICD-10-CM | POA: Diagnosis not present

## 2022-08-17 DIAGNOSIS — F10239 Alcohol dependence with withdrawal, unspecified: Secondary | ICD-10-CM | POA: Diagnosis not present

## 2022-08-17 DIAGNOSIS — E119 Type 2 diabetes mellitus without complications: Secondary | ICD-10-CM | POA: Diagnosis not present

## 2022-08-17 NOTE — Patient Instructions (Signed)
Please call the care guide team at [phone] if you need to cancel or reschedule your appointment.   If you are experiencing a Mental Health or Behavioral Health Crisis or need someone to talk to, please call the Suicide and Crisis Lifeline: 988 call the [place] National Suicide Prevention Lifeline: 6051249081 or TTY: 440-144-3451 TTY (818) 614-1477) to talk to a trained counselor call 1-800-273-TALK (toll free, 24 hour hotline) go to [facility] [address], [place] [phone])   Following is a copy of the CCM Program Consent:  CCM service includes personalized support from designated clinical staff supervised by the physician, including individualized plan of care and coordination with other care providers 24/7 contact phone numbers for assistance for urgent and routine care needs. Service will only be billed when office clinical staff spend 20 minutes or more in a month to coordinate care. Only one practitioner may furnish and bill the service in a calendar month. The patient may stop CCM services at amy time (effective at the end of the month) by phone call to the office staff. The patient will be responsible for cost sharing (co-pay) or up to 20% of the service fee (after annual deductible is met)  Following is a copy of your full provider care plan:   Goals Addressed             This Visit's Progress    RNCM Care Management  Expected Outcome:  Monitor, Self-Manage and Reduce Symptoms of Afib       Current Barriers:  Chronic Disease Management support and education needs related to effective management of AFIB  Planned Interventions: Provider order and care plan reviewed. Collaborated with PharmD regarding patient care and plan. Has an appointment with pharm D for medication reconciliation and management.  Counseled on increased risk of stroke due to Afib and benefits of anticoagulation for stroke prevention           Reviewed  importance of adherence to anticoagulant exactly as prescribed Advised patient to discuss changes in AFIB, questions or concerns with provider Counseled on bleeding risk associated with AFIB and importance of self-monitoring for signs/symptoms of bleeding Counseled on avoidance of NSAIDs due to increased bleeding risk with anticoagulants Counseled on importance of regular laboratory monitoring as prescribed Counseled on seeking medical attention after a head injury or if there is blood in the urine/stool Afib action plan reviewed Screening for signs and symptoms of depression related to chronic disease state Assessed social determinant of health barriers  Symptom Management: Take medications as prescribed   Attend all scheduled provider appointments Call provider office for new concerns or questions  call the Suicide and Crisis Lifeline: 988 call the [place] National Suicide Prevention Lifeline: (319)393-7350 or TTY: 4783133670 TTY 330-517-6286) to talk to a trained counselor call 1-800-273-TALK (toll free, 24 hour hotline) go to [facility] [address], [place] [phone]) if experiencing a Mental Health or Behavioral Health Crisis  - make a plan to eat healthy - keep all lab appointments - take medicine as prescribed  Follow Up Plan: Telephone follow up appointment with care management team member scheduled for: 08-30-2022 at 1030 am       RNCM Care Management  Expected Outcome:  Monitor, Self-Manage, and Reduce Symptoms of Hypertension       Current Barriers:  Knowledge Deficits related to the importance of calling the providers when out of medications or changes in HTN or heart health Care Coordination needs related to  pharmacy support and education, referral pending in a patient with HTN and other chronic conditions Chronic Disease Management support and education needs related to effective management of HTN BP Readings from Last 3  Encounters:  08/11/22 130/62  08/05/22 (!) 110/50  06/15/22 120/60     Planned Interventions: Evaluation of current treatment plan related to hypertension self management and patient's adherence to plan as established by provider. The patient is having better blood pressures. Is having some swelling in his feet but has not been taking his fluid pill. Sees providers for pulmonary tomorrow. Encouraged the patient to call the cardiologist to make a follow up appointment;   Provided education to patient re: stroke prevention, s/s of heart attack and stroke; Reviewed prescribed diet heart healthy diet. The patient states that his sodium is low and they tell him it is okay to eat salt. Does have issues with swelling and edema in his feet and legs. The patient states he and his son eat a lot of fast foods. Education on the benefits of eating fresh fruits and vegetables. Discussed cutting back on drinking beer.  Will attach heart healthy eating plan to the AVS for the patient to review.  Reviewed medications with patient and discussed importance of compliance. States he has not been taking his fluid pill. Encouraged him to talk with the provider about when to take his fluid pill. Upcoming appointments with the pharm D on 08-24-2022.;  Discussed plans with patient for ongoing care management follow up and provided patient with direct contact information for care management team; Advised patient, providing education and rationale, to monitor blood pressure daily and record, calling PCP for findings outside established parameters;  Reviewed scheduled/upcoming provider appointments including: 08-25-2022 at 0940 am Advised patient to discuss changes in his HTN or  heart health with provider; Provided education on prescribed diet heart healthy diet;  Discussed complications of poorly controlled blood pressure such as heart disease, stroke, circulatory complications, vision complications, kidney impairment, sexual  dysfunction;  Screening for signs and symptoms of depression related to chronic disease state;  Assessed social determinant of health barriers;   Symptom Management: Take medications as prescribed   Attend all scheduled provider appointments Call provider office for new concerns or questions  call the Suicide and Crisis Lifeline: 988 call the [place] National Suicide Prevention Lifeline: 808-289-1193 or TTY: 515-253-0535 TTY (203)711-1408) to talk to a trained counselor call 1-800-273-TALK (toll free, 24 hour hotline) go to [facility] [address], [place] [phone]) if experiencing a Mental Health or Behavioral Health Crisis  check blood pressure weekly learn about high blood pressure call doctor for signs and symptoms of high blood pressure develop an action plan for high blood pressure keep all doctor appointments take medications for blood pressure exactly as prescribed report new symptoms to your doctor  Follow Up Plan: Telephone follow up appointment with care management team member scheduled for: 08-30-2022 at 1030 am       RNCM Care Management Expected Outcome:  Monitor, Self-Manage and Reduce Symptoms of Heart Failure       Current Barriers:  Knowledge Deficits related to the benefits of weighing daily to monitor for excess fluid and to prevent an exacerbation of HF Care Coordination needs related to education and support from pharm D for medication management and support  in a patient with HF Chronic Disease Management support and education needs related to effective management of HF Wt Readings from Last 3 Encounters:  08/11/22 213  lb (96.6 kg)  08/05/22 214 lb (97.1 kg)  06/15/22 203 lb (92.1 kg)     Planned Interventions: Basic overview and discussion of pathophysiology of Heart Failure reviewed. The patient has been to the ER recently due to fluid overload. He states that he has not been taking his fluid pill. Education on  calling his cardiologist and getting a follow up appointment. The patient is weighing daily and his weight is stable 212 and 213. Provided education on low sodium diet. The patient states he is mindful of sodium but his sodium levels have been low. Review of heart healthy diet and will send information in the mail to the patient Reviewed Heart Failure Action Plan in depth and provided written copy Assessed need for readable accurate scales in home. The patient is weighing daily and denies any acute changes  Discussed importance of daily weight and advised patient to weigh and record daily. The patient states he can tell if he has more water weight on board by how hard it is to get his shoes on. He no longer wears socks. Education on possibly monitoring his weight daily as this is an indicator of exacerbation of heart failure and may be one of the reason his is having more shortness of breath recently. Review of calling the provider for +2 or +3 pounds in one day and + 5 pounds in one week. Reminder provided today. Reviewed role of diuretics in prevention of fluid overload and management of heart failure. The patient states he has not been taking his Torsemide. Education on talking to his cardiologist about parameters to take his fluid pill.   Discussed the importance of keeping all appointments with provider Advised patient to discuss changes in fluid balance, changes in HF, questions, or concerns with provider Screening for signs and symptoms of depression related to chronic disease state  Assessed social determinant of health barriers EF 50 to 55% on 06-10-2021, education provided on what the EF% was and how it impacts his chronic conditions  Symptom Management: Take medications as prescribed   Attend all scheduled provider appointments Call provider office for new concerns or questions  call the Suicide and Crisis Lifeline: 988 call the [place] National Suicide Prevention Lifeline: 336-054-8986 or  TTY: 226-157-4248 TTY 848-346-9213) to talk to a trained counselor call 1-800-273-TALK (toll free, 24 hour hotline) go to [facility] [address] [place] [phone]) if experiencing a Mental Health or Behavioral Health Crisis  call office if I gain more than 2 pounds in one day or 5 pounds in one week use salt in moderation watch for swelling in feet, ankles and legs every day weigh myself daily develop a rescue plan follow rescue plan if symptoms flare-up  Follow Up Plan: Telephone follow up appointment with care management team member scheduled for: 08-30-2022 at 1030 am       RNCM Care Management:  Maintain, Monitor and Self-Manage Symptoms of COPD       Current Barriers:  Knowledge Deficits related to the importance of following the plan of care and taking medications as directed for effective management of COPD Care Coordination needs related to pharmacy needs  in a patient with COPD Chronic Disease Management support and education needs related to effective management of COPD 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 The patient is having shortness of breath and is using his oxygen at night. He has been to the ER for recent dyspnea.  The patient is going to the pulmonary provider tomorrow. Reminder given to the patient to keep his appointment.  Advised patient to track and manage COPD triggers Provided written and verbal instructions on pursed lip breathing and utilized returned demonstration as teach back Provided instruction about proper use of medications used for management of COPD including inhalers Advised patient to self assesses COPD action plan zone and make appointment with provider  if in the yellow zone for 48 hours without improvement Advised patient to engage in light exercise as tolerated 3-5 days a week to aid in the the management of COPD Provided education about and advised patient to utilize infection prevention strategies to reduce risk of respiratory infection Discussed the importance of adequate rest and management of fatigue with COPD Screening for signs and symptoms of depression related to chronic disease state  Assessed social determinant of health barriers  Symptom Management: Take medications as prescribed   Attend all scheduled provider appointments Call provider office for new concerns or questions  call the Suicide and Crisis Lifeline: 988 call the [place] National Suicide Prevention Lifeline: 917-261-0608 or TTY: (617)578-3753 TTY [phone]) to talk to a trained counselor call 1-800-273-TALK (toll free, 24 hour hotline) go to [facility] [address] [place] [phone]) if experiencing a Mental Health or Behavioral Health Crisis  identify and remove indoor air pollutants limit outdoor activity during cold weather listen for public air quality announcements every day develop a rescue plan eliminate symptom triggers at home follow rescue plan if symptoms flare-up  Follow Up Plan: Telephone follow up appointment with care management team member scheduled for: 08-30-2022 at 1030 am          The patient verbalized understanding of instructions, educational materials, and care plan provided today and DECLINED offer to receive copy of patient instructions, educational materials, and care plan.  Telephone follow up appointment with care management team member scheduled for: 08-30-2022 at 1030 am

## 2022-08-17 NOTE — Telephone Encounter (Signed)
[facility] Client Coordination Note Report -08/17/2022

## 2022-08-17 NOTE — Chronic Care Management (AMB) (Signed)
Chronic Care Management   CCM RN Visit Note  08/17/2022 Name: BARTH REPP MRN: [id number] DOB: 01/12/1941  Subjective: [name] is a 82 y.o. year old male who is a primary care patient of [name], Kirsten, MD. The patient was referred to the Chronic Care Management team for assistance with care management needs subsequent to provider initiation of CCM services and plan of care.    Today's Visit:  Engaged with patient by telephone for follow up visit.     SDOH Interventions Today    Flowsheet Row Most Recent Value  SDOH Interventions   Health Literacy Interventions Other (Comment)  [tries to understand but needs reinforcement]         Goals Addressed             This Visit's Progress    RNCM Care Management  Expected Outcome:  Monitor, Self-Manage and Reduce Symptoms of Afib       Current Barriers:  Chronic Disease Management support and education needs related to effective management of AFIB  Planned Interventions: Provider order and care plan reviewed. Collaborated with PharmD regarding patient care and plan. Has an appointment with pharm D for medication reconciliation and management.  Counseled on increased risk of stroke due to Afib and benefits of anticoagulation for stroke prevention           Reviewed importance of adherence to anticoagulant exactly as prescribed Advised patient to discuss changes in AFIB, questions or concerns with provider Counseled on bleeding risk associated with AFIB and importance of self-monitoring for signs/symptoms of bleeding Counseled on avoidance of NSAIDs due to increased bleeding risk with anticoagulants Counseled on importance of regular laboratory monitoring as prescribed Counseled on seeking medical attention after a head injury or if there is blood in the urine/stool Afib action plan reviewed Screening for signs and symptoms of depression related to chronic disease state Assessed social determinant of health  barriers  Symptom Management: Take medications as prescribed   Attend all scheduled provider appointments Call provider office for new concerns or questions  call the Suicide and Crisis Lifeline: 988 call the [place] National Suicide Prevention Lifeline: 402-170-4092 or TTY: (937)643-6101 TTY (321)546-9822) to talk to a trained counselor call 1-800-273-TALK (toll free, 24 hour hotline) go to [facility] [address], [place] [phone]) if experiencing a Mental Health or Behavioral Health Crisis  - make a plan to eat healthy - keep all lab appointments - take medicine as prescribed  Follow Up Plan: Telephone follow up appointment with care management team member scheduled for: 08-30-2022 at 1030 am       RNCM Care Management  Expected Outcome:  Monitor, Self-Manage, and Reduce Symptoms of Hypertension       Current Barriers:  Knowledge Deficits related to the importance of calling the providers when out of medications or changes in HTN or heart health Care Coordination needs related to pharmacy support and education, referral pending in a patient with HTN and other chronic conditions Chronic Disease Management support and education needs related to effective management of HTN BP Readings from Last 3 Encounters:  08/11/22 130/62  08/05/22 (!) 110/50  06/15/22 120/60     Planned Interventions: Evaluation of current treatment plan related to hypertension self management and patient's adherence to plan as established by provider. The patient is having better blood pressures. Is having some swelling in his feet but has not been taking his fluid pill. Sees providers for pulmonary tomorrow. Encouraged the patient  to call the cardiologist to make a follow up appointment;   Provided education to patient re: stroke prevention, s/s of heart attack and stroke; Reviewed prescribed diet heart healthy diet. The patient states that his sodium is low and they  tell him it is okay to eat salt. Does have issues with swelling and edema in his feet and legs. The patient states he and his son eat a lot of fast foods. Education on the benefits of eating fresh fruits and vegetables. Discussed cutting back on drinking beer.  Will attach heart healthy eating plan to the AVS for the patient to review.  Reviewed medications with patient and discussed importance of compliance. States he has not been taking his fluid pill. Encouraged him to talk with the provider about when to take his fluid pill. Upcoming appointments with the pharm D on 08-24-2022.;  Discussed plans with patient for ongoing care management follow up and provided patient with direct contact information for care management team; Advised patient, providing education and rationale, to monitor blood pressure daily and record, calling PCP for findings outside established parameters;  Reviewed scheduled/upcoming provider appointments including: 08-25-2022 at 0940 am Advised patient to discuss changes in his HTN or  heart health with provider; Provided education on prescribed diet heart healthy diet;  Discussed complications of poorly controlled blood pressure such as heart disease, stroke, circulatory complications, vision complications, kidney impairment, sexual dysfunction;  Screening for signs and symptoms of depression related to chronic disease state;  Assessed social determinant of health barriers;   Symptom Management: Take medications as prescribed   Attend all scheduled provider appointments Call provider office for new concerns or questions  call the Suicide and Crisis Lifeline: 988 call the [place] National Suicide Prevention Lifeline: 240-247-7445 or TTY: (639)369-4368 TTY 2621697321) to talk to a trained counselor call 1-800-273-TALK (toll free, 24 hour hotline) go to [facility] [address], [place] [phone]) if experiencing a Mental Health or  Behavioral Health Crisis  check blood pressure weekly learn about high blood pressure call doctor for signs and symptoms of high blood pressure develop an action plan for high blood pressure keep all doctor appointments take medications for blood pressure exactly as prescribed report new symptoms to your doctor  Follow Up Plan: Telephone follow up appointment with care management team member scheduled for: 08-30-2022 at 1030 am       RNCM Care Management Expected Outcome:  Monitor, Self-Manage and Reduce Symptoms of Heart Failure       Current Barriers:  Knowledge Deficits related to the benefits of weighing daily to monitor for excess fluid and to prevent an exacerbation of HF Care Coordination needs related to education and support from pharm D for medication management and support  in a patient with HF Chronic Disease Management support and education needs related to effective management of HF Wt Readings from Last 3 Encounters:  08/11/22 213 lb (96.6 kg)  08/05/22 214 lb (97.1 kg)  06/15/22 203 lb (92.1 kg)     Planned Interventions: Basic overview and discussion of pathophysiology of Heart Failure reviewed. The patient has been to the ER recently due to fluid overload. He states that he has not been taking his fluid pill. Education on calling his cardiologist and getting a follow up appointment. The patient is weighing daily and his weight is stable 212 and 213. Provided education on low sodium diet. The patient states he is mindful of sodium but his sodium levels have been low.  Review of heart healthy diet and will send information in the mail to the patient Reviewed Heart Failure Action Plan in depth and provided written copy Assessed need for readable accurate scales in home. The patient is weighing daily and denies any acute changes  Discussed importance of daily weight and advised patient to weigh and record daily. The patient states he can tell if he has more water weight on  board by how hard it is to get his shoes on. He no longer wears socks. Education on possibly monitoring his weight daily as this is an indicator of exacerbation of heart failure and may be one of the reason his is having more shortness of breath recently. Review of calling the provider for +2 or +3 pounds in one day and + 5 pounds in one week. Reminder provided today. Reviewed role of diuretics in prevention of fluid overload and management of heart failure. The patient states he has not been taking his Torsemide. Education on talking to his cardiologist about parameters to take his fluid pill.   Discussed the importance of keeping all appointments with provider Advised patient to discuss changes in fluid balance, changes in HF, questions, or concerns with provider Screening for signs and symptoms of depression related to chronic disease state  Assessed social determinant of health barriers EF 50 to 55% on 06-10-2021, education provided on what the EF% was and how it impacts his chronic conditions  Symptom Management: Take medications as prescribed   Attend all scheduled provider appointments Call provider office for new concerns or questions  call the Suicide and Crisis Lifeline: 988 call the [place] National Suicide Prevention Lifeline: 435-408-5469 or TTY: 6047151032 TTY 209 850 5263) to talk to a trained counselor call 1-800-273-TALK (toll free, 24 hour hotline) go to [facility] [address] [place] [phone]) if experiencing a Mental Health or Behavioral Health Crisis  call office if I gain more than 2 pounds in one day or 5 pounds in one week use salt in moderation watch for swelling in feet, ankles and legs every day weigh myself daily develop a rescue plan follow rescue plan if symptoms flare-up  Follow Up Plan: Telephone follow up appointment with care management team member scheduled for: 08-30-2022 at 1030 am       RNCM Care  Management:  Maintain, Monitor and Self-Manage Symptoms of COPD       Current Barriers:  Knowledge Deficits related to the importance of following the plan of care and taking medications as directed for effective management of COPD Care Coordination needs related to pharmacy needs  in a patient with COPD Chronic Disease Management support and education needs related to effective management of COPD Current every day smoker 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 The patient is having shortness of breath and is using his oxygen at night. He has been to the ER for recent dyspnea.  The patient is going to the pulmonary provider tomorrow. Reminder given to the patient to keep his appointment.  Advised patient to track and manage COPD triggers Provided written and verbal instructions on pursed lip breathing and utilized returned demonstration as teach back Provided instruction about proper use of medications used for management of COPD including inhalers Advised patient to self assesses COPD action  plan zone and make appointment with provider if in the yellow zone for 48 hours without improvement Advised patient to engage in light exercise as tolerated 3-5 days a week to aid in the the management of COPD Provided education about and advised patient to utilize infection prevention strategies to reduce risk of respiratory infection Discussed the importance of adequate rest and management of fatigue with COPD Screening for signs and symptoms of depression related to chronic disease state  Assessed social determinant of health barriers  Symptom Management: Take medications as prescribed   Attend all scheduled provider appointments Call provider office for new concerns or  questions  call the Suicide and Crisis Lifeline: 988 call the [place] National Suicide Prevention Lifeline: (680) 638-9619 or TTY: 305-589-0980 TTY (872)338-1433) to talk to a trained counselor call 1-800-273-TALK (toll free, 24 hour hotline) go to [facility] [address] [place] [phone]) if experiencing a Mental Health or Behavioral Health Crisis  identify and remove indoor air pollutants limit outdoor activity during cold weather listen for public air quality announcements every day develop a rescue plan eliminate symptom triggers at home follow rescue plan if symptoms flare-up  Follow Up Plan: Telephone follow up appointment with care management team member scheduled for: 08-30-2022 at 1030 am          Plan:Telephone follow up appointment with care management team member scheduled for:  08-30-2022 at 1030 am  Alto Denver RN, MSN, CCM RN Care Manager  Chronic Care Management Direct Number: [phone]

## 2022-08-18 ENCOUNTER — Ambulatory Visit (INDEPENDENT_AMBULATORY_CARE_PROVIDER_SITE_OTHER): Payer: Medicare HMO | Admitting: Pulmonary Disease

## 2022-08-18 ENCOUNTER — Other Ambulatory Visit: Payer: Self-pay | Admitting: Family Medicine

## 2022-08-18 ENCOUNTER — Encounter: Payer: Self-pay | Admitting: Pulmonary Disease

## 2022-08-18 ENCOUNTER — Ambulatory Visit: Payer: Medicare HMO | Admitting: Pulmonary Disease

## 2022-08-18 ENCOUNTER — Encounter: Payer: Self-pay | Admitting: Specialist

## 2022-08-18 VITALS — BP 128/60 | HR 69 | Ht 67.5 in | Wt 215.4 lb

## 2022-08-18 DIAGNOSIS — J441 Chronic obstructive pulmonary disease with (acute) exacerbation: Secondary | ICD-10-CM | POA: Diagnosis not present

## 2022-08-18 DIAGNOSIS — J449 Chronic obstructive pulmonary disease, unspecified: Secondary | ICD-10-CM

## 2022-08-18 DIAGNOSIS — G4734 Idiopathic sleep related nonobstructive alveolar hypoventilation: Secondary | ICD-10-CM | POA: Diagnosis not present

## 2022-08-18 DIAGNOSIS — F17210 Nicotine dependence, cigarettes, uncomplicated: Secondary | ICD-10-CM | POA: Diagnosis not present

## 2022-08-18 DIAGNOSIS — I480 Paroxysmal atrial fibrillation: Secondary | ICD-10-CM

## 2022-08-18 DIAGNOSIS — Z7951 Long term (current) use of inhaled steroids: Secondary | ICD-10-CM

## 2022-08-18 DIAGNOSIS — I5023 Acute on chronic systolic (congestive) heart failure: Secondary | ICD-10-CM

## 2022-08-18 DIAGNOSIS — I10 Essential (primary) hypertension: Secondary | ICD-10-CM

## 2022-08-18 LAB — PULMONARY FUNCTION TEST
DL/VA % pred: 65 %
DL/VA: 2.58 ml/min/mmHg/L
DLCO unc % pred: 47 %
DLCO unc: 10.57 ml/min/mmHg
FEF 25-75 Pre: 0.53 L/sec
FEF2575-%Pred-Pre: 31 %
FEV1-%Pred-Pre: 51 %
FEV1-Pre: 1.29 L
FEV1FVC-%Pred-Pre: 65 %
FEV6-%Pred-Pre: 78 %
FEV6-Pre: 2.61 L
FEV6FVC-%Pred-Pre: 101 %
FVC-%Pred-Pre: 77 %
FVC-Pre: 2.76 L
Pre FEV1/FVC ratio: 47 %
Pre FEV6/FVC Ratio: 94 %
RV % pred: 268 %
RV: 6.83 L
TLC % pred: 133 %
TLC: 8.77 L

## 2022-08-18 MED ORDER — BUDESONIDE 0.5 MG/2ML IN SUSP: 0.5000 mg | Freq: Two times a day (BID) | RESPIRATORY_TRACT | 11 refills | Status: DC

## 2022-08-18 MED ORDER — REVEFENACIN 175 MCG/3ML IN SOLN: 175.0000 ug | Freq: Every day | RESPIRATORY_TRACT | 11 refills | Status: DC

## 2022-08-18 MED ORDER — BENZONATATE 100 MG PO CAPS: 200.0000 mg | ORAL_CAPSULE | Freq: Three times a day (TID) | ORAL | 0 refills | Status: DC | PRN

## 2022-08-18 MED ORDER — ARFORMOTEROL TARTRATE 15 MCG/2ML IN NEBU: 15.0000 ug | INHALATION_SOLUTION | Freq: Two times a day (BID) | RESPIRATORY_TRACT | 11 refills | Status: DC

## 2022-08-18 MED ORDER — EQ MUCUS RELIEF 12 HOUR MAX ST 1200 MG PO TB12: 1.0000 | ORAL_TABLET | Freq: Two times a day (BID) | ORAL | 0 refills | Status: DC

## 2022-08-18 NOTE — Patient Instructions (Signed)
Full PFT performed today except [place]. Provider was notified.

## 2022-08-18 NOTE — Patient Instructions (Addendum)
Recommend transition to an all nebulizer regimen  Budesonide 0.5mg  twice daily Brovana twice daily Yupelri once daily  Continue to use albuterol inhaler or nebulizer machine every 4 hours as needed  Continue to use trelegy ellipta inhaler 1 puff daily until your new nebulizer medications arrive.   Follow up in 3-4 months

## 2022-08-18 NOTE — Addendum Note (Signed)
Addended by: Maurene Capes on: 08/18/2022 04:23 PM   Modules accepted: Orders

## 2022-08-18 NOTE — Progress Notes (Signed)
Synopsis: Referred in February 2024 for COPD by Lurline Del, FNP  Subjective:   PATIENT ID: Kyle Farley GENDER: male DOB: February 24, 1940, MRN: [id number]  HPI  Chief Complaint  Patient presents with   Follow-up    F/U after PFT. States his breathing has gotten worse since last visit.    Kyle Farley is an 82 year old male, daily smoker with history of GERD, hypertension, paroxysmal atrial fibrillation, COPD with nocturnal hypoxemia who returns to pulmonary clinic for COPD.   Initial OV 03/10/22 He reports progressive shortness of breath over the past 4-5 years. He is using trelegy 100, 1 puff daily and as needed albuterol inhaler or nebulizer treatments. He has significant dyspnea with any exertional activity. He uses a walker to ambulate. He is accompanied by his son. He complains of chest tightness or discomfort when he breathing gets bad. He has intermittent wheezing.   He is smoking 1-2 packs per day, he has been smoking for 68 years. He retired 10 years ago and lives with his son. He is not interested in quitting smoking.   Today 08/18/22  He complains of significant shortness of breath. He is using albuterol frequently. He is using trelegy daily but does not feel he is able to get a good breath in to get the medicine into his lungs.   PFTs today show FEV 1 1.29L (51%) FVC 2.76L (77%) DLCO 47%  He continues to smoke 1.5-2 packs per day.    Past Medical History:  Diagnosis Date   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 (HCC) 11/10/2021   Acquired hypothyroidism 11/10/2021   Acute hypoxic respiratory failure (HCC) 04/25/2022   Acute on chronic diastolic heart failure (HCC) 04/25/2022   Acute on chronic systolic heart failure (HCC) 11/10/2021   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/10/2021   Alcohol use 09/12/2020   Anemia of chronic disease 04/25/2022   Arthritis    Atherosclerosis of native arteries of extremities with intermittent claudication,  bilateral legs (HCC) 11/10/2021   Bilateral leg edema 02/16/2022   BPH (benign prostatic hyperplasia) 11/10/2021   CHF (congestive heart failure) (HCC)    Chronic hyponatremia 04/25/2022   Chronic respiratory failure with hypoxia (HCC) 05/09/2022   Cigarette smoker 10/21/2014   Class 1 obesity 04/25/2022   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)    Dyspnea    Dysrhythmia    atrial fibrillation   Encounter for prostate cancer screening 02/16/2022   Encounter for screening for lung cancer 02/16/2022   Essential hypertension 10/21/2014   Femur fracture, right (HCC) 09/12/2020   GERD (gastroesophageal reflux disease)    History of kidney stones    Hyperlipidemia 01/24/2017   Hypertension    Hyponatremia 04/26/2022   Kidney stones 01/24/2017   Paroxysmal atrial fibrillation (HCC) 10/21/2014   Pressure injury of skin 09/16/2020   SOB (shortness of breath) 05/10/2022   Tobacco abuse 02/16/2022     Family History  Problem Relation Age of Onset   Cancer Mother    Diabetes Mother    Breast cancer Sister      Social History   Socioeconomic History   Marital status: Widowed    Spouse name: Not on file   Number of children: 3   Years of education: Not on file   Highest education level: High school graduate  Occupational History   Occupation: retired  Tobacco Use   Smoking status: Every Day    Current packs/day: 1.00    Average packs/day: 1  pack/day for 62.0 years (62.0 ttl pk-yrs)    Types: Cigarettes, Cigars   Smokeless tobacco: Never  Vaping Use   Vaping status: Never Used  Substance and Sexual Activity   Alcohol use: Yes    Alcohol/week: 5.0 standard drinks of alcohol    Types: 5 Standard drinks or equivalent per week    Comment: 4-5 beers per day   Drug use: No   Sexual activity: Not Currently  Other Topics Concern   Not on file  Social History Narrative   Not on file   Social Determinants of Health   Financial Resource Strain: Low Risk  (04/26/2022)    Overall Financial Resource Strain (CARDIA)    Difficulty of Paying Living Expenses: Not very hard  Food Insecurity: No Food Insecurity (04/15/2022)   Hunger Vital Sign    Worried About Running Out of Food in the Last Year: Never true    Ran Out of Food in the Last Year: Never true  Transportation Needs: No Transportation Needs (04/26/2022)   PRAPARE - Administrator, Civil Service (Medical): No    Lack of Transportation (Non-Medical): No  Physical Activity: Inactive (04/15/2022)   Exercise Vital Sign    Days of Exercise per Week: 0 days    Minutes of Exercise per Session: 0 min  Stress: No Stress Concern Present (04/15/2022)   [employer] of Occupational Health - Occupational Stress Questionnaire    Feeling of Stress : Not at all  Social Connections: Moderately Isolated (04/15/2022)   Social Connection and Isolation Panel [NHANES]    Frequency of Communication with Friends and Family: More than three times a week    Frequency of Social Gatherings with Friends and Family: More than three times a week    Attends Religious Services: More than 4 times per year    Active Member of [employer] or Organizations: No    Attends Banker Meetings: Never    Marital Status: Widowed  Intimate Partner Violence: Not At Risk (04/15/2022)   Humiliation, Afraid, Rape, and Kick questionnaire    Fear of Current or Ex-Partner: No    Emotionally Abused: No    Physically Abused: No    Sexually Abused: No     No Known Allergies    Outpatient Medications Prior to Visit  Medication Sig Dispense Refill   albuterol (VENTOLIN HFA) 108 (90 Base) MCG/ACT inhaler Inhale 2 puffs into the lungs every 4 (four) hours as needed for wheezing or shortness of breath. 48 each 2   amiodarone (PACERONE) 200 MG tablet Take 200 mg by mouth 2 (two) times daily.     amLODipine (NORVASC) 5 MG tablet Take 5 mg by mouth daily.     apixaban (ELIQUIS) 5 MG TABS tablet Take 1 tablet (5 mg total) by mouth 2 (two)  times daily. 180 tablet 3   atorvastatin (LIPITOR) 40 MG tablet Take 1 tablet (40 mg total) by mouth daily. 90 tablet 3   benzonatate (TESSALON) 100 MG capsule SMARTSIG:2 Capsule(s) By Mouth Every 8 Hours PRN     cetirizine (ZYRTEC) 10 MG tablet Take 10 mg by mouth at bedtime.     diphenhydrAMINE HCl, Sleep, (ZZZQUIL PO) Take 1 tablet by mouth at bedtime.     empagliflozin (JARDIANCE) 10 MG TABS tablet Take 1 tablet (10 mg total) by mouth daily. 90 tablet 1   EQ MUCUS RELIEF 12 HOUR MAX ST 1200 MG TB12 Take 1 tablet by mouth 2 (two) times daily.  Fluticasone-Umeclidin-Vilant (TRELEGY ELLIPTA) 200-62.5-25 MCG/ACT AEPB INHALE 2 PUFFS ONCE DAILY 60 each 3   ipratropium-albuterol (DUONEB) 0.5-2.5 (3) MG/3ML SOLN Inhale 3 mLs into the lungs every 6 (six) hours as needed (For shortness of breath). 360 mL 1   levothyroxine (SYNTHROID) 50 MCG tablet Take 50 mcg by mouth daily before breakfast.     metoprolol succinate (TOPROL-XL) 50 MG 24 hr tablet Take 12.5 mg by mouth daily. Take with or immediately following a meal.     montelukast (SINGULAIR) 10 MG tablet Take 1 tablet (10 mg total) by mouth daily at 12 noon. 90 tablet 3   nitroGLYCERIN (NITROSTAT) 0.4 MG SL tablet Place 0.4 mg under the tongue every 5 (five) minutes as needed for chest pain.     OXYGEN Inhale 3 L into the lungs as needed (shortness of breath).     pantoprazole (PROTONIX) 40 MG tablet Take 1 tablet (40 mg total) by mouth daily. 90 tablet 3   spironolactone (ALDACTONE) 25 MG tablet Take 1 tablet (25 mg total) by mouth daily. 90 tablet 1   tamsulosin (FLOMAX) 0.4 MG CAPS capsule Take 1 capsule (0.4 mg total) by mouth daily. 90 capsule 1   thiamine (VITAMIN B1) 100 MG tablet Take 100 mg by mouth daily.     thiamine (VITAMIN B1) 100 MG tablet Take 100 mg by mouth daily.     torsemide (DEMADEX) 20 MG tablet Take 20 mg by mouth 2 (two) times daily.     predniSONE (DELTASONE) 20 MG tablet Take 40 mg by mouth daily.     No  facility-administered medications prior to visit.   Review of Systems  Constitutional:  Negative for chills, fever, malaise/fatigue and weight loss.  HENT:  Negative for congestion, sinus pain and sore throat.   Eyes: Negative.   Respiratory:  Positive for cough, sputum production and shortness of breath. Negative for hemoptysis and wheezing.   Cardiovascular:  Negative for chest pain, palpitations, orthopnea, claudication and leg swelling.  Gastrointestinal:  Negative for abdominal pain, heartburn, nausea and vomiting.  Genitourinary: Negative.   Musculoskeletal:  Negative for joint pain and myalgias.  Skin:  Negative for rash.  Neurological:  Negative for weakness.  Endo/Heme/Allergies: Negative.   Psychiatric/Behavioral: Negative.     Objective:   Vitals:   08/18/22 1326  BP: 128/60  Pulse: 69  SpO2: 94%  Weight: 215 lb 6.4 oz (97.7 kg)  Height: 5' 7.5" (1.715 m)    Physical Exam Constitutional:      General: He is not in acute distress. HENT:     Head: Normocephalic and atraumatic.  Eyes:     Conjunctiva/sclera: Conjunctivae normal.  Cardiovascular:     Rate and Rhythm: Normal rate and regular rhythm.     Pulses: Normal pulses.     Heart sounds: Normal heart sounds. No murmur heard. Pulmonary:     Breath sounds: No wheezing or rhonchi.  Musculoskeletal:     Right lower leg: No edema.     Left lower leg: No edema.  Skin:    General: Skin is warm and dry.  Neurological:     General: No focal deficit present.     Mental Status: He is alert.     CBC    Component Value Date/Time   WBC 10.9 08/12/2022 0000   WBC 10.3 06/12/2022 0251   RBC 4.04 08/12/2022 0000   HGB 10.9 (A) 08/12/2022 0000   HGB 10.8 (L) 08/11/2022 1341   HCT 34 (A) 08/12/2022 0000  HCT 35.1 (L) 08/11/2022 1341   PLT 329 08/12/2022 0000   PLT 328 08/11/2022 1341   MCV 85 08/11/2022 1341   MCH 26.1 (L) 08/11/2022 1341   MCH 27.1 06/12/2022 0251   MCHC 30.8 (L) 08/11/2022 1341   MCHC  32.2 06/12/2022 0251   RDW 15.4 08/11/2022 1341   LYMPHSABS 0.6 (L) 08/11/2022 1341   MONOABS 0.2 04/25/2022 0638   EOSABS 0.0 08/11/2022 1341   BASOSABS 0.0 08/11/2022 1341      Latest Ref Rng & Units 08/12/2022   12:00 AM 08/11/2022    1:41 PM 06/24/2022    9:02 AM  BMP  Glucose 70 - 99 mg/dL  725  98   BUN 4 - 21 28     34  17   Creatinine 0.6 - 1.3 1.1     1.24  1.00   BUN/Creat Ratio 10 - 24  27  17    Sodium 137 - 147 135     137  138   Potassium 3.5 - 5.1 mEq/L 5.4     6.3  5.4   Chloride 99 - 108 102     100  99   CO2 13 - 22 23     24  28    Calcium 8.7 - 10.7 8.9     9.1  9.3      This result is from an external source.   Chest imaging:  PFT:    Latest Ref Rng & Units 08/18/2022   10:35 AM  PFT Results  FVC-Pre L 2.76  P  FVC-Predicted Pre % 77  P  Pre FEV1/FVC % % 47  P  FEV1-Pre L 1.29  P  FEV1-Predicted Pre % 51  P  DLCO uncorrected ml/min/mmHg 10.57  P  DLCO UNC% % 47  P  DLVA Predicted % 65  P  TLC L 8.77  P  TLC % Predicted % 133  P  RV % Predicted % 268  P    P Preliminary result    Labs:  Path:  Echo:  Heart Catheterization:  Assessment & Plan:   Chronic obstructive pulmonary disease, unspecified COPD type (HCC)  Nocturnal hypoxemia  Cigarette smoker  Discussion: Kyle Farley is an 82 year old male, daily smoker with history of GERD, hypertension, paroxysmal atrial fibrillation, COPD with nocturnal hypoxemia who returns to pulmonary clinic for COPD.   He has severe COPD and function limitations. He may be having issues with effectively using his trelegy. We will transition him to all nebulizer regimen.   Start budesonide 0.5mg  twice daily, brovana twice daily and yupelri daily.   PFTs show moderately severe obstruction, air trapping and moderate diffusion defect.  Follow up in 4 months.  Melody Comas, MD [facility] Office: [phone]    Current Outpatient Medications:    albuterol (VENTOLIN HFA)  108 (90 Base) MCG/ACT inhaler, Inhale 2 puffs into the lungs every 4 (four) hours as needed for wheezing or shortness of breath., Disp: 48 each, Rfl: 2   amiodarone (PACERONE) 200 MG tablet, Take 200 mg by mouth 2 (two) times daily., Disp: , Rfl:    amLODipine (NORVASC) 5 MG tablet, Take 5 mg by mouth daily., Disp: , Rfl:    apixaban (ELIQUIS) 5 MG TABS tablet, Take 1 tablet (5 mg total) by mouth 2 (two) times daily., Disp: 180 tablet, Rfl: 3   atorvastatin (LIPITOR) 40 MG tablet, Take 1 tablet (40 mg total) by mouth daily., Disp: 90  tablet, Rfl: 3   benzonatate (TESSALON) 100 MG capsule, SMARTSIG:2 Capsule(s) By Mouth Every 8 Hours PRN, Disp: , Rfl:    cetirizine (ZYRTEC) 10 MG tablet, Take 10 mg by mouth at bedtime., Disp: , Rfl:    diphenhydrAMINE HCl, Sleep, (ZZZQUIL PO), Take 1 tablet by mouth at bedtime., Disp: , Rfl:    empagliflozin (JARDIANCE) 10 MG TABS tablet, Take 1 tablet (10 mg total) by mouth daily., Disp: 90 tablet, Rfl: 1   EQ MUCUS RELIEF 12 HOUR MAX ST 1200 MG TB12, Take 1 tablet by mouth 2 (two) times daily., Disp: , Rfl:    Fluticasone-Umeclidin-Vilant (TRELEGY ELLIPTA) 200-62.5-25 MCG/ACT AEPB, INHALE 2 PUFFS ONCE DAILY, Disp: 60 each, Rfl: 3   ipratropium-albuterol (DUONEB) 0.5-2.5 (3) MG/3ML SOLN, Inhale 3 mLs into the lungs every 6 (six) hours as needed (For shortness of breath)., Disp: 360 mL, Rfl: 1   levothyroxine (SYNTHROID) 50 MCG tablet, Take 50 mcg by mouth daily before breakfast., Disp: , Rfl:    metoprolol succinate (TOPROL-XL) 50 MG 24 hr tablet, Take 12.5 mg by mouth daily. Take with or immediately following a meal., Disp: , Rfl:    montelukast (SINGULAIR) 10 MG tablet, Take 1 tablet (10 mg total) by mouth daily at 12 noon., Disp: 90 tablet, Rfl: 3   nitroGLYCERIN (NITROSTAT) 0.4 MG SL tablet, Place 0.4 mg under the tongue every 5 (five) minutes as needed for chest pain., Disp: , Rfl:    OXYGEN, Inhale 3 L into the lungs as needed (shortness of breath)., Disp: ,  Rfl:    pantoprazole (PROTONIX) 40 MG tablet, Take 1 tablet (40 mg total) by mouth daily., Disp: 90 tablet, Rfl: 3   spironolactone (ALDACTONE) 25 MG tablet, Take 1 tablet (25 mg total) by mouth daily., Disp: 90 tablet, Rfl: 1   tamsulosin (FLOMAX) 0.4 MG CAPS capsule, Take 1 capsule (0.4 mg total) by mouth daily., Disp: 90 capsule, Rfl: 1   thiamine (VITAMIN B1) 100 MG tablet, Take 100 mg by mouth daily., Disp: , Rfl:    thiamine (VITAMIN B1) 100 MG tablet, Take 100 mg by mouth daily., Disp: , Rfl:    torsemide (DEMADEX) 20 MG tablet, Take 20 mg by mouth 2 (two) times daily., Disp: , Rfl:

## 2022-08-18 NOTE — Progress Notes (Signed)
Full PFT performed today except Post Spiro. Provider was notified.

## 2022-08-19 DIAGNOSIS — J449 Chronic obstructive pulmonary disease, unspecified: Secondary | ICD-10-CM | POA: Diagnosis not present

## 2022-08-20 DIAGNOSIS — F10239 Alcohol dependence with withdrawal, unspecified: Secondary | ICD-10-CM | POA: Diagnosis not present

## 2022-08-20 DIAGNOSIS — I959 Hypotension, unspecified: Secondary | ICD-10-CM | POA: Diagnosis not present

## 2022-08-20 DIAGNOSIS — Z7951 Long term (current) use of inhaled steroids: Secondary | ICD-10-CM | POA: Diagnosis not present

## 2022-08-20 DIAGNOSIS — J440 Chronic obstructive pulmonary disease with acute lower respiratory infection: Secondary | ICD-10-CM | POA: Diagnosis not present

## 2022-08-20 DIAGNOSIS — D649 Anemia, unspecified: Secondary | ICD-10-CM | POA: Diagnosis not present

## 2022-08-20 DIAGNOSIS — F17210 Nicotine dependence, cigarettes, uncomplicated: Secondary | ICD-10-CM | POA: Diagnosis not present

## 2022-08-20 DIAGNOSIS — E119 Type 2 diabetes mellitus without complications: Secondary | ICD-10-CM | POA: Diagnosis not present

## 2022-08-20 DIAGNOSIS — Z7901 Long term (current) use of anticoagulants: Secondary | ICD-10-CM | POA: Diagnosis not present

## 2022-08-20 DIAGNOSIS — I714 Abdominal aortic aneurysm, without rupture, unspecified: Secondary | ICD-10-CM | POA: Diagnosis not present

## 2022-08-20 DIAGNOSIS — N400 Enlarged prostate without lower urinary tract symptoms: Secondary | ICD-10-CM | POA: Diagnosis not present

## 2022-08-20 DIAGNOSIS — Z7984 Long term (current) use of oral hypoglycemic drugs: Secondary | ICD-10-CM | POA: Diagnosis not present

## 2022-08-20 DIAGNOSIS — J188 Other pneumonia, unspecified organism: Secondary | ICD-10-CM | POA: Diagnosis not present

## 2022-08-20 DIAGNOSIS — E669 Obesity, unspecified: Secondary | ICD-10-CM | POA: Diagnosis not present

## 2022-08-20 DIAGNOSIS — E785 Hyperlipidemia, unspecified: Secondary | ICD-10-CM | POA: Diagnosis not present

## 2022-08-20 DIAGNOSIS — J441 Chronic obstructive pulmonary disease with (acute) exacerbation: Secondary | ICD-10-CM | POA: Diagnosis not present

## 2022-08-20 DIAGNOSIS — Z6831 Body mass index (BMI) 31.0-31.9, adult: Secondary | ICD-10-CM | POA: Diagnosis not present

## 2022-08-20 DIAGNOSIS — I480 Paroxysmal atrial fibrillation: Secondary | ICD-10-CM | POA: Diagnosis not present

## 2022-08-20 DIAGNOSIS — J9621 Acute and chronic respiratory failure with hypoxia: Secondary | ICD-10-CM | POA: Diagnosis not present

## 2022-08-20 DIAGNOSIS — Z9981 Dependence on supplemental oxygen: Secondary | ICD-10-CM | POA: Diagnosis not present

## 2022-08-20 DIAGNOSIS — I5032 Chronic diastolic (congestive) heart failure: Secondary | ICD-10-CM | POA: Diagnosis not present

## 2022-08-21 DIAGNOSIS — F17210 Nicotine dependence, cigarettes, uncomplicated: Secondary | ICD-10-CM | POA: Diagnosis not present

## 2022-08-21 DIAGNOSIS — E119 Type 2 diabetes mellitus without complications: Secondary | ICD-10-CM | POA: Diagnosis not present

## 2022-08-21 DIAGNOSIS — J189 Pneumonia, unspecified organism: Secondary | ICD-10-CM | POA: Diagnosis not present

## 2022-08-21 DIAGNOSIS — I480 Paroxysmal atrial fibrillation: Secondary | ICD-10-CM | POA: Diagnosis not present

## 2022-08-21 DIAGNOSIS — R001 Bradycardia, unspecified: Secondary | ICD-10-CM | POA: Diagnosis not present

## 2022-08-21 DIAGNOSIS — J441 Chronic obstructive pulmonary disease with (acute) exacerbation: Secondary | ICD-10-CM | POA: Diagnosis not present

## 2022-08-21 DIAGNOSIS — N400 Enlarged prostate without lower urinary tract symptoms: Secondary | ICD-10-CM | POA: Diagnosis not present

## 2022-08-21 DIAGNOSIS — F1020 Alcohol dependence, uncomplicated: Secondary | ICD-10-CM | POA: Diagnosis not present

## 2022-08-21 DIAGNOSIS — J9621 Acute and chronic respiratory failure with hypoxia: Secondary | ICD-10-CM | POA: Diagnosis not present

## 2022-08-21 DIAGNOSIS — J440 Chronic obstructive pulmonary disease with acute lower respiratory infection: Secondary | ICD-10-CM | POA: Diagnosis not present

## 2022-08-21 DIAGNOSIS — I9589 Other hypotension: Secondary | ICD-10-CM | POA: Diagnosis not present

## 2022-08-21 DIAGNOSIS — I5032 Chronic diastolic (congestive) heart failure: Secondary | ICD-10-CM | POA: Diagnosis not present

## 2022-08-24 ENCOUNTER — Other Ambulatory Visit: Payer: Medicare HMO

## 2022-08-24 NOTE — Progress Notes (Signed)
   08/24/2022  Patient ID: Kyle Farley, male   DOB: [birth year], 82 y.o.   MRN: [id number]  S/O Telephone visit to follow-up on medication management   Medication Management -Reviewed medication list with patient for accuracy -He reports pulmonology recently stopped Trelegy and prescribed 3 nebulizer solutions (Borvana, Pulmicort, and Yupleri) to take its place.  He has received these medications but has not yet started taking them.  He states he plans to take for 1 month to see if he sees additional benefit, because the total for all three was around $300/month -Eliquis and Jardiance are also somewhat expensive for him -Patient states someone looked into patient assistance programs for him previously he believes; but he does not think he qualified -He endorses good adherence to medication regimen and plans to start nebulizer treatments after seeing Dr. Sedalia Muta tomorrow  A/P  Medication Management -Patient states he was previously on Breztri, but his insurance would not cover this.  Based on our discuss and his current situation, I believe he would qualify for the [place] PAP for Breztri.  I will notify Pulmonology in the event they would like for the medication assistance team to look into this.  Follow-up:  Thurston Hole to check in with patient in 3 months unless needs arise prior  Lenna Gilford, PharmD, DPLA

## 2022-08-25 ENCOUNTER — Ambulatory Visit (INDEPENDENT_AMBULATORY_CARE_PROVIDER_SITE_OTHER): Payer: Medicare HMO | Admitting: Family Medicine

## 2022-08-25 ENCOUNTER — Other Ambulatory Visit: Payer: Self-pay | Admitting: Family Medicine

## 2022-08-25 VITALS — BP 110/50 | HR 72 | Temp 97.1°F | Resp 18 | Ht 67.5 in | Wt 210.0 lb

## 2022-08-25 DIAGNOSIS — I5023 Acute on chronic systolic (congestive) heart failure: Secondary | ICD-10-CM | POA: Diagnosis not present

## 2022-08-25 DIAGNOSIS — K219 Gastro-esophageal reflux disease without esophagitis: Secondary | ICD-10-CM | POA: Diagnosis not present

## 2022-08-25 DIAGNOSIS — Z6831 Body mass index (BMI) 31.0-31.9, adult: Secondary | ICD-10-CM | POA: Diagnosis not present

## 2022-08-25 DIAGNOSIS — E119 Type 2 diabetes mellitus without complications: Secondary | ICD-10-CM | POA: Diagnosis not present

## 2022-08-25 DIAGNOSIS — J9621 Acute and chronic respiratory failure with hypoxia: Secondary | ICD-10-CM | POA: Diagnosis not present

## 2022-08-25 DIAGNOSIS — I5032 Chronic diastolic (congestive) heart failure: Secondary | ICD-10-CM | POA: Diagnosis not present

## 2022-08-25 DIAGNOSIS — I10 Essential (primary) hypertension: Secondary | ICD-10-CM | POA: Diagnosis not present

## 2022-08-25 DIAGNOSIS — J449 Chronic obstructive pulmonary disease, unspecified: Secondary | ICD-10-CM

## 2022-08-25 DIAGNOSIS — I110 Hypertensive heart disease with heart failure: Secondary | ICD-10-CM | POA: Diagnosis not present

## 2022-08-25 DIAGNOSIS — Z7901 Long term (current) use of anticoagulants: Secondary | ICD-10-CM | POA: Diagnosis not present

## 2022-08-25 DIAGNOSIS — F10239 Alcohol dependence with withdrawal, unspecified: Secondary | ICD-10-CM | POA: Diagnosis not present

## 2022-08-25 DIAGNOSIS — I480 Paroxysmal atrial fibrillation: Secondary | ICD-10-CM

## 2022-08-25 DIAGNOSIS — J440 Chronic obstructive pulmonary disease with acute lower respiratory infection: Secondary | ICD-10-CM | POA: Diagnosis not present

## 2022-08-25 DIAGNOSIS — N400 Enlarged prostate without lower urinary tract symptoms: Secondary | ICD-10-CM | POA: Diagnosis not present

## 2022-08-25 DIAGNOSIS — D649 Anemia, unspecified: Secondary | ICD-10-CM | POA: Diagnosis not present

## 2022-08-25 DIAGNOSIS — Z7951 Long term (current) use of inhaled steroids: Secondary | ICD-10-CM | POA: Diagnosis not present

## 2022-08-25 DIAGNOSIS — F17210 Nicotine dependence, cigarettes, uncomplicated: Secondary | ICD-10-CM | POA: Diagnosis not present

## 2022-08-25 DIAGNOSIS — E669 Obesity, unspecified: Secondary | ICD-10-CM | POA: Diagnosis not present

## 2022-08-25 DIAGNOSIS — E785 Hyperlipidemia, unspecified: Secondary | ICD-10-CM | POA: Diagnosis not present

## 2022-08-25 DIAGNOSIS — I959 Hypotension, unspecified: Secondary | ICD-10-CM | POA: Diagnosis not present

## 2022-08-25 DIAGNOSIS — Z7984 Long term (current) use of oral hypoglycemic drugs: Secondary | ICD-10-CM

## 2022-08-25 DIAGNOSIS — J188 Other pneumonia, unspecified organism: Secondary | ICD-10-CM | POA: Diagnosis not present

## 2022-08-25 DIAGNOSIS — E1142 Type 2 diabetes mellitus with diabetic polyneuropathy: Secondary | ICD-10-CM | POA: Diagnosis not present

## 2022-08-25 DIAGNOSIS — Z9981 Dependence on supplemental oxygen: Secondary | ICD-10-CM | POA: Diagnosis not present

## 2022-08-25 DIAGNOSIS — D638 Anemia in other chronic diseases classified elsewhere: Secondary | ICD-10-CM

## 2022-08-25 DIAGNOSIS — I714 Abdominal aortic aneurysm, without rupture, unspecified: Secondary | ICD-10-CM | POA: Diagnosis not present

## 2022-08-25 DIAGNOSIS — J441 Chronic obstructive pulmonary disease with (acute) exacerbation: Secondary | ICD-10-CM | POA: Diagnosis not present

## 2022-08-25 LAB — HEMOGLOBIN A1C
Est. average glucose Bld gHb Est-mCnc: 137 mg/dL
Hgb A1c MFr Bld: 6.4 % — ABNORMAL HIGH (ref 4.8–5.6)

## 2022-08-25 LAB — LIPID PANEL
Chol/HDL Ratio: 1.9 ratio (ref 0.0–5.0)
Cholesterol, Total: 168 mg/dL (ref 100–199)
HDL: 87 mg/dL (ref >39)
LDL Chol Calc (NIH): 67 mg/dL (ref 0–99)
Triglycerides: 76 mg/dL (ref 0–149)
VLDL Cholesterol Cal: 14 mg/dL (ref 5–40)

## 2022-08-25 LAB — COMPREHENSIVE METABOLIC PANEL
ALT: 18 IU/L (ref 0–44)
AST: 20 IU/L (ref 0–40)
Albumin: 4.1 g/dL (ref 3.7–4.7)
Alkaline Phosphatase: 90 IU/L (ref 44–121)
BUN/Creatinine Ratio: 12 (ref 10–24)
BUN: 14 mg/dL (ref 8–27)
Bilirubin Total: 0.4 mg/dL (ref 0.0–1.2)
CO2: 27 mmol/L (ref 20–29)
Calcium: 9.4 mg/dL (ref 8.6–10.2)
Chloride: 95 mmol/L — ABNORMAL LOW (ref 96–106)
Creatinine, Ser: 1.17 mg/dL (ref 0.76–1.27)
Globulin, Total: 2.7 g/dL (ref 1.5–4.5)
Glucose: 104 mg/dL — ABNORMAL HIGH (ref 70–99)
Potassium: 4.4 mmol/L (ref 3.5–5.2)
Sodium: 138 mmol/L (ref 134–144)
Total Protein: 6.8 g/dL (ref 6.0–8.5)
eGFR: 63 mL/min/{1.73_m2} (ref >59)

## 2022-08-25 LAB — CBC WITH DIFFERENTIAL/PLATELET
Basophils Absolute: 0 10*3/uL (ref 0.0–0.2)
Basos: 1 %
EOS (ABSOLUTE): 0.2 10*3/uL (ref 0.0–0.4)
Eos: 2 %
Hematocrit: 34.7 % — ABNORMAL LOW (ref 37.5–51.0)
Hemoglobin: 10.4 g/dL — ABNORMAL LOW (ref 13.0–17.7)
Immature Grans (Abs): 0.1 10*3/uL (ref 0.0–0.1)
Immature Granulocytes: 1 %
Lymphocytes Absolute: 1.3 10*3/uL (ref 0.7–3.1)
Lymphs: 16 %
MCH: 25.4 pg — ABNORMAL LOW (ref 26.6–33.0)
MCHC: 30 g/dL — ABNORMAL LOW (ref 31.5–35.7)
MCV: 85 fL (ref 79–97)
Monocytes Absolute: 0.6 10*3/uL (ref 0.1–0.9)
Monocytes: 8 %
Neutrophils Absolute: 5.6 10*3/uL (ref 1.4–7.0)
Neutrophils: 72 %
Platelets: 283 10*3/uL (ref 150–450)
RBC: 4.1 x10E6/uL — ABNORMAL LOW (ref 4.14–5.80)
RDW: 15.4 % (ref 11.6–15.4)
WBC: 7.7 10*3/uL (ref 3.4–10.8)

## 2022-08-25 MED ORDER — IRON (FERROUS SULFATE) 325 (65 FE) MG PO TABS: 325.0000 mg | ORAL_TABLET | Freq: Every day | ORAL | 2 refills | Status: DC

## 2022-08-25 NOTE — Progress Notes (Unsigned)
Subjective:  Patient ID: Kyle Farley, male    DOB: 1940/04/22  Age: 82 y.o. MRN: [id number]  Chief Complaint  Patient presents with  . Medical Management of Chronic Issues    HPI COPD:  Brovana twice daily, albuterol HFA 4-5 times per day as needed, pulmicort via neb., mucinex mucous relief, tessalon as needed for cough.  Oxygen 3 L. Sees pulmonology.   Atrial fibrillation:  Eliquis twice daily, amiodarone 200 mg twice daily, metoprolol xl 50 mg once daily.  Hyperlipidemia:  Atorvastatin 40 mg daily.  Hypertension:  Amlodipine 5 mg daily, metoprolol 50 mg daily, spironolactone 25 mg daily. .    Diabetes: not checking sugars. On Jardiance 10 mg daily.  Due to CONGESTIVE HEART FAILURE   Hypothyroidism: synthroid 50 mcg daily.   Physical therapy and nursing are scheduled to come out today.   Anemia on recent labwork.      08/05/2022   11:08 AM 05/27/2022    4:12 PM 04/02/2022   11:24 AM 04/02/2022   10:41 AM 02/16/2022    2:56 PM  Depression screen PHQ 2/9  Decreased Interest 0 0 0 0 0  Down, Depressed, Hopeless 0 0 0 0 0  PHQ - 2 Score 0 0 0 0 0  Altered sleeping 0 0     Tired, decreased energy 0 2     Change in appetite 0 2     Feeling bad or failure about yourself  0 0     Trouble concentrating 0 0     Moving slowly or fidgety/restless 0 3     Suicidal thoughts 0 0     PHQ-9 Score 0 7     Difficult doing work/chores Not difficult at all Somewhat difficult           08/17/2022    2:20 PM  Fall Risk   Falls in the past year? 1  Number falls in past yr: 0  Injury with Fall? 1  Risk for fall due to : History of fall(s);Impaired balance/gait;Impaired mobility  Follow up Falls evaluation completed;Education provided;Falls prevention discussed;Follow up appointment    Patient Care Team: Blane Ohara, MD as PCP - General (Family Medicine) Regan Lemming, MD as Consulting Physician (Cardiology) Marlowe Sax, RN as Case Manager (General Practice) Baldo Daub, MD as Consulting Physician (Cardiology)   Review of Systems  Constitutional:  Negative for chills and fever.  HENT:  Negative for congestion, rhinorrhea and sore throat.   Respiratory:  Positive for cough, shortness of breath and wheezing.   Cardiovascular:  Positive for leg swelling. Negative for chest pain and palpitations.  Gastrointestinal:  Negative for abdominal pain, constipation, diarrhea, nausea and vomiting.  Genitourinary:  Negative for dysuria and urgency.  Musculoskeletal:  Negative for arthralgias, back pain and myalgias.  Neurological:  Negative for dizziness and headaches.  Psychiatric/Behavioral:  Negative for dysphoric mood. The patient is not nervous/anxious.     Current Outpatient Medications on File Prior to Visit  Medication Sig Dispense Refill  . albuterol (VENTOLIN HFA) 108 (90 Base) MCG/ACT inhaler Inhale 2 puffs into the lungs every 4 (four) hours as needed for wheezing or shortness of breath. 48 each 2  . amiodarone (PACERONE) 200 MG tablet Take 200 mg by mouth 2 (two) times daily.    Marland Kitchen amLODipine (NORVASC) 5 MG tablet Take 5 mg by mouth daily.    Marland Kitchen apixaban (ELIQUIS) 5 MG TABS tablet Take 1 tablet (5 mg total) by mouth 2 (  two) times daily. 180 tablet 3  . arformoterol (BROVANA) 15 MCG/2ML NEBU Take 2 mLs (15 mcg total) by nebulization 2 (two) times daily. 120 mL 11  . atorvastatin (LIPITOR) 40 MG tablet Take 1 tablet (40 mg total) by mouth daily. 90 tablet 3  . benzonatate (TESSALON) 100 MG capsule Take 2 capsules (200 mg total) by mouth 3 (three) times daily as needed for cough. 30 capsule 0  . budesonide (PULMICORT) 0.5 MG/2ML nebulizer solution Take 2 mLs (0.5 mg total) by nebulization in the morning and at bedtime. 120 mL 11  . cetirizine (ZYRTEC) 10 MG tablet Take 10 mg by mouth at bedtime.    . diphenhydrAMINE HCl, Sleep, (ZZZQUIL PO) Take 1 tablet by mouth at bedtime.    . empagliflozin (JARDIANCE) 10 MG TABS tablet Take 1 tablet (10 mg total) by mouth  daily. 90 tablet 1  . EQ MUCUS RELIEF 12 HOUR MAX ST 1200 MG TB12 Take 1 tablet (1,200 mg total) by mouth 2 (two) times daily. 60 tablet 0  . levothyroxine (SYNTHROID) 50 MCG tablet Take 50 mcg by mouth daily before breakfast.    . metoprolol succinate (TOPROL-XL) 50 MG 24 hr tablet Take 12.5 mg by mouth daily. Take with or immediately following a meal.    . montelukast (SINGULAIR) 10 MG tablet Take 1 tablet (10 mg total) by mouth daily at 12 noon. 90 tablet 3  . nitroGLYCERIN (NITROSTAT) 0.4 MG SL tablet Place 0.4 mg under the tongue every 5 (five) minutes as needed for chest pain.    . OXYGEN Inhale 3 L into the lungs as needed (shortness of breath).    . pantoprazole (PROTONIX) 40 MG tablet Take 1 tablet (40 mg total) by mouth daily. 90 tablet 3  . revefenacin (YUPELRI) 175 MCG/3ML nebulizer solution Take 3 mLs (175 mcg total) by nebulization daily. 90 mL 11  . spironolactone (ALDACTONE) 25 MG tablet Take 1 tablet (25 mg total) by mouth daily. 90 tablet 1  . tamsulosin (FLOMAX) 0.4 MG CAPS capsule Take 1 capsule (0.4 mg total) by mouth daily. 90 capsule 1  . thiamine (VITAMIN B1) 100 MG tablet Take 100 mg by mouth daily.    Marland Kitchen torsemide (DEMADEX) 20 MG tablet Take 20 mg by mouth 2 (two) times daily.     No current facility-administered medications on file prior to visit.   Past Medical History:  Diagnosis Date  . 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 (HCC) 11/10/2021  . Acquired hypothyroidism 11/10/2021  . Acute hypoxic respiratory failure (HCC) 04/25/2022  . Acute on chronic diastolic heart failure (HCC) 04/25/2022  . Acute on chronic systolic heart failure (HCC) 11/10/2021  . 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/10/2021  . Alcohol use 09/12/2020  . Anemia of chronic disease 04/25/2022  . Arthritis   . Atherosclerosis of native arteries of extremities with intermittent claudication, bilateral legs (HCC) 11/10/2021  . Bilateral leg edema 02/16/2022  . BPH  (benign prostatic hyperplasia) 11/10/2021  . CHF (congestive heart failure) (HCC)   . Chronic hyponatremia 04/25/2022  . Chronic respiratory failure with hypoxia (HCC) 05/09/2022  . Cigarette smoker 10/21/2014  . Class 1 obesity 04/25/2022  . 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 Dyspnea   . Dysrhythmia    atrial fibrillation  . Encounter for prostate cancer screening 02/16/2022  . Encounter for screening for lung cancer 02/16/2022  . Essential hypertension 10/21/2014  . Femur fracture, right (HCC) 09/12/2020  . GERD (gastroesophageal reflux  disease)   . History of kidney stones   . Hyperlipidemia 01/24/2017  . Hypertension   . Hyponatremia 04/26/2022  . Kidney stones 01/24/2017  . Paroxysmal atrial fibrillation (HCC) 10/21/2014  . Pressure injury of skin 09/16/2020  . SOB (shortness of breath) 05/10/2022  . Tobacco abuse 02/16/2022   Past Surgical History:  Procedure Laterality Date  . LEFT HEART CATH AND CORONARY ANGIOGRAPHY N/A 09/15/2020   Procedure: LEFT HEART CATH AND CORONARY ANGIOGRAPHY;  Surgeon: Tonny Bollman, MD;  Location: [facility] INVASIVE CV LAB;  Service: Cardiovascular;  Laterality: N/A;  . LEG SURGERY Right    steal pin placed in right leg 35 years ago  . ORIF FEMUR FRACTURE Right 09/15/2020   Procedure: OPEN REDUCTION INTERNAL FIXATION FEMORAL SHAFT FRACTURE;  Surgeon: Roby Lofts, MD;  Location: [facility] OR;  Service: Orthopedics;  Laterality: Right;    Family History  Problem Relation Age of Onset  . Cancer Mother   . Diabetes Mother   . Breast cancer Sister    Social History   Socioeconomic History  . Marital status: Widowed    Spouse name: Not on file  . Number of children: 3  . Years of education: Not on file  . Highest education level: High school graduate  Occupational History  . Occupation: retired  Tobacco Use  . Smoking status: Every Day    Current packs/day: 1.00    Average packs/day: 1 pack/day for 62.0 years (62.0 ttl pk-yrs)     Types: Cigarettes, Cigars  . Smokeless tobacco: Never  Vaping Use  . Vaping status: Never Used  Substance and Sexual Activity  . Alcohol use: Yes    Alcohol/week: 5.0 standard drinks of alcohol    Types: 5 Standard drinks or equivalent per week    Comment: 4-5 beers per day  . Drug use: No  . Sexual activity: Not Currently  Other Topics Concern  . Not on file  Social History Narrative  . Not on file   Social Determinants of Health   Financial Resource Strain: Low Risk  (04/26/2022)   Overall Financial Resource Strain (CARDIA)   . Difficulty of Paying Living Expenses: Not very hard  Food Insecurity: No Food Insecurity (04/15/2022)   Hunger Vital Sign   . Worried About Programme researcher, broadcasting/film/video in the Last Year: Never true   . Ran Out of Food in the Last Year: Never true  Transportation Needs: No Transportation Needs (04/26/2022)   PRAPARE - Transportation   . Lack of Transportation (Medical): No   . Lack of Transportation (Non-Medical): No  Physical Activity: Inactive (04/15/2022)   Exercise Vital Sign   . Days of Exercise per Week: 0 days   . Minutes of Exercise per Session: 0 min  Stress: No Stress Concern Present (04/15/2022)   [employer] of Occupational Health - Occupational Stress Questionnaire   . Feeling of Stress : Not at all  Social Connections: Moderately Isolated (04/15/2022)   Social Connection and Isolation Panel [NHANES]   . Frequency of Communication with Friends and Family: More than three times a week   . Frequency of Social Gatherings with Friends and Family: More than three times a week   . Attends Religious Services: More than 4 times per year   . Active Member of Clubs or Organizations: No   . Attends Banker Meetings: Never   . Marital Status: Widowed    Objective:  BP (!) 110/50   Pulse 72   Temp (!) 97.1  F (36.2 C)   Resp 18   Ht 5' 7.5" (1.715 m)   Wt 210 lb (95.3 kg)   SpO2 93%   BMI 32.41 kg/m      08/25/2022    9:40 AM  08/18/2022    1:26 PM 08/11/2022    1:22 PM  BP/Weight  Systolic BP 110 128 130  Diastolic BP 50 60 62  Wt. (Lbs) 210 215.4 213  BMI 32.41 kg/m2 33.24 kg/m2 31.45 kg/m2    Physical Exam Vitals reviewed.  Constitutional:      Appearance: Normal appearance. He is obese.  Neck:     Vascular: No carotid bruit.  Cardiovascular:     Rate and Rhythm: Normal rate and regular rhythm.     Heart sounds: Normal heart sounds.  Pulmonary:     Effort: Pulmonary effort is normal.     Breath sounds: Normal breath sounds. No wheezing, rhonchi or rales.  Abdominal:     General: Bowel sounds are normal.     Palpations: Abdomen is soft.     Tenderness: There is no abdominal tenderness.  Neurological:     Mental Status: He is alert and oriented to person, place, and time.  Psychiatric:        Mood and Affect: Mood normal.        Behavior: Behavior normal.    Diabetic Foot Exam - Simple   Simple Foot Form  08/25/2022  8:24 PM  Visual Inspection No deformities, no ulcerations, no other skin breakdown bilaterally: Yes Sensation Testing See comments: Yes Pulse Check Posterior Tibialis and Dorsalis pulse intact bilaterally: Yes Comments Feet are filthy. No socks. Dirty shoes.  No sensation of either feet.   Does not wash his feet.       Lab Results  Component Value Date   WBC 7.7 08/25/2022   HGB 10.4 (L) 08/25/2022   HCT 34.7 (L) 08/25/2022   PLT 283 08/25/2022   GLUCOSE 104 (H) 08/25/2022   CHOL 168 08/25/2022   TRIG 76 08/25/2022   HDL 87 08/25/2022   LDLCALC 67 08/25/2022   ALT 18 08/25/2022   AST 20 08/25/2022   NA 138 08/25/2022   K 4.4 08/25/2022   CL 95 (L) 08/25/2022   CREATININE 1.17 08/25/2022   BUN 14 08/25/2022   CO2 27 08/25/2022   TSH 3.770 05/05/2022   HGBA1C 6.4 (H) 08/25/2022      Assessment & Plan:    Paroxysmal atrial fibrillation (HCC) Assessment & Plan: Management per specialist. Continue Eliquis twice daily, amiodarone 200 mg twice daily,  metoprolol xl 50 mg once daily.   Hyperlipidemia, unspecified hyperlipidemia type Assessment & Plan: Well controlled.  No changes to medicines. Atorvastatin 40 mg daily Continue to work on eating a healthy diet and exercise.  Labs drawn today.    Orders: -     Lipid panel  Acute on chronic systolic congestive heart failure (HCC) Assessment & Plan: Management per specialist. Continue Amlodipine 5 mg daily, metoprolol 50 mg daily, spironolactone 25 mg daily. .      Primary hypertension Assessment & Plan: Well controlled. Continue toprol xl, spironalctone, and torsemide.   Orders: -     CBC with Differential/Platelet -     Comprehensive metabolic panel  Gastroesophageal reflux disease without esophagitis Assessment & Plan: The current medical regimen is effective;  continue present plan and medications.    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  Oxygen 3 L.   Diabetic polyneuropathy associated with type 2 diabetes mellitus (HCC) Assessment & Plan: Control: well controlled Recommend check sugars fasting daily. Recommend annual eye exams. Medicines: On Jardiance 10 mg daily. Continue to work on eating a healthy diet and exercise.  Referral to podiatry.  Wash feet daily.  Check feet daily for sores. Labs drawn today.     Orders: -     Hemoglobin A1c -     Microalbumin / creatinine urine ratio  Other orders -     Iron (Ferrous Sulfate); Take 325 mg by mouth daily.  Dispense: 30 tablet; Refill: 2     Meds ordered this encounter  Medications  . Iron, Ferrous Sulfate, 325 (65 Fe) MG TABS    Sig: Take 325 mg by mouth daily.    Dispense:  30 tablet    Refill:  2    Orders Placed This Encounter  Procedures  . CBC with Differential/Platelet  . Comprehensive metabolic panel  . Hemoglobin A1c   . Lipid panel  . Microalbumin / creatinine urine ratio     Follow-up: Return in about 3 months (around 11/25/2022) for chronic follow up.   I,Carolyn M Morrison,acting as a Neurosurgeon for Blane Ohara, MD.,have documented all relevant documentation on the behalf of Blane Ohara, MD,as directed by  Blane Ohara, MD while in the presence of Blane Ohara, MD.   Clayborn Bigness I Leal-Borjas,acting as a scribe for Blane Ohara, MD.,have documented all relevant documentation on the behalf of Blane Ohara, MD,as directed by  Blane Ohara, MD while in the presence of Blane Ohara, MD.    An After Visit Summary was printed and given to the patient.  Blane Ohara, MD  Family Practice [phone]

## 2022-08-25 NOTE — Patient Instructions (Addendum)
Referral to podiatry.  Wash feet daily.  Check feet daily for sores.  Return for eye screening at our office.  Please do stool cards to check for blood and return those to our office.  I will speak with her sister about getting the patient assistance for several of her medicines.

## 2022-08-28 DIAGNOSIS — E1142 Type 2 diabetes mellitus with diabetic polyneuropathy: Secondary | ICD-10-CM

## 2022-08-28 HISTORY — DX: Type 2 diabetes mellitus with diabetic polyneuropathy: E11.42

## 2022-08-28 NOTE — Assessment & Plan Note (Signed)
Well controlled.  No changes to medicines. Atorvastatin 40 mg daily Continue to work on eating a healthy diet and exercise.  Labs drawn today.   

## 2022-08-28 NOTE — Assessment & Plan Note (Addendum)
Management per specialist. Continue Amlodipine 5 mg daily, metoprolol 50 mg daily, spironolactone 25 mg daily. Marland Kitchen

## 2022-08-28 NOTE — Assessment & Plan Note (Addendum)
Management per specialist. Continue Eliquis twice daily, amiodarone 200 mg twice daily, metoprolol xl 50 mg once daily.

## 2022-08-28 NOTE — Assessment & Plan Note (Addendum)
Control: well controlled Recommend annual eye exams. Medicines: On Jardiance 10 mg daily. Continue to work on eating a healthy diet and exercise.  Referral to podiatry.  Wash feet daily.  Check feet daily for sores. Labs drawn today.

## 2022-08-28 NOTE — Assessment & Plan Note (Signed)
Continue Brovana twice daily, albuterol HFA 4-5 times per day as needed, pulmicort via neb., mucinex mucous relief, tessalon as needed for cough.  Oxygen 3 L.

## 2022-08-28 NOTE — Assessment & Plan Note (Signed)
The current medical regimen is effective;  continue present plan and medications.  

## 2022-08-28 NOTE — Assessment & Plan Note (Addendum)
Well controlled. Continue toprol xl, spironalctone, and torsemide.

## 2022-08-29 ENCOUNTER — Encounter: Payer: Self-pay | Admitting: Family Medicine

## 2022-08-30 ENCOUNTER — Other Ambulatory Visit: Payer: Medicare HMO

## 2022-08-30 ENCOUNTER — Telehealth: Payer: Self-pay | Admitting: Pulmonary Disease

## 2022-08-30 ENCOUNTER — Other Ambulatory Visit: Payer: Self-pay

## 2022-08-30 NOTE — Patient Outreach (Signed)
Care Management   Visit Note  08/30/2022 Name: Kyle Farley MRN: [id number] DOB: January 14, 1941  Subjective: Kyle Farley is a 82 y.o. year old male who is a primary care patient of [name], Kirsten, MD. The Care Management team was consulted for assistance.      Engaged with patient spoke with patient by telephone.    Goals Addressed             This Visit's Progress    RNCM Care Management  Expected Outcome:  Monitor, Self-Manage and Reduce Symptoms of Afib       Current Barriers:  Chronic Disease Management support and education needs related to effective management of AFIB  Planned Interventions: Provider order and care plan reviewed. Collaborated with PharmD regarding patient care and plan. Works with pharm D on a regular basis. Saw pcp recently. No changes in the current plan of care.  Counseled on increased risk of stroke due to Afib and benefits of anticoagulation for stroke prevention           Reviewed importance of adherence to anticoagulant exactly as prescribed. Is compliant with medications. No recent changes in medications Advised patient to discuss changes in AFIB, questions or concerns with provider Counseled on bleeding risk associated with AFIB and importance of self-monitoring for signs/symptoms of bleeding Counseled on avoidance of NSAIDs due to increased bleeding risk with anticoagulants Counseled on importance of regular laboratory monitoring as prescribed. Has labs on a regular basis.  Counseled on seeking medical attention after a head injury or if there is blood in the urine/stool. Review on being safe and monitoring for acute changes Afib action plan reviewed Screening for signs and symptoms of depression related to chronic disease state Assessed social determinant of health barriers  Symptom Management: Take medications as prescribed   Attend all scheduled provider appointments Call provider office for new concerns or questions  call the Suicide and  Crisis Lifeline: 988 call the [place] National Suicide Prevention Lifeline: (236)467-5587 or TTY: 573-326-8875 TTY 610-527-4407) to talk to a trained counselor call 1-800-273-TALK (toll free, 24 hour hotline) go to [facility] [address] [place] [phone]) if experiencing a Mental Health or Behavioral Health Crisis  - make a plan to eat healthy - keep all lab appointments - take medicine as prescribed  Follow Up Plan: Telephone follow up appointment with care management team member scheduled for: 10-25-2022 at 1030 am       RNCM Care Management  Expected Outcome:  Monitor, Self-Manage, and Reduce Symptoms of Hypertension       Current Barriers:  Knowledge Deficits related to the importance of calling the providers when out of medications or changes in HTN or heart health Care Coordination needs related to pharmacy support and education, referral pending in a patient with HTN and other chronic conditions Chronic Disease Management support and education needs related to effective management of HTN BP Readings from Last 3 Encounters:  08/25/22 (!) 110/50  08/18/22 128/60  08/11/22 130/62     Planned Interventions: Evaluation of current treatment plan related to hypertension self management and patient's adherence to plan as established by provider. The patient is having better blood pressures. Is having some swelling in his feet but has not been taking his fluid pill. Saw pcp on 08-25-2022. Denies any new concerns related to HTN or heart health. Discussed orthostatic hypotension.  Provided education to patient re: stroke prevention, s/s of heart attack and stroke; Reviewed prescribed diet heart healthy  diet. The patient states that his sodium is low and they tell him it is okay to eat salt. Does have issues with swelling and edema in his feet and legs. The patient states he and his son eat a lot of fast foods. Education on the benefits of eating  fresh fruits and vegetables. Discussed cutting back on drinking beer.  Denies any acute changes in his dietary habits.  Reviewed medications with patient and discussed importance of compliance. States he has not been taking his fluid pill. Encouraged him to talk with the provider about when to take his fluid pill. States he is doing well. Denies any acute changes in his medications. Saw pcp last week.  Discussed plans with patient for ongoing care management follow up and provided patient with direct contact information for care management team; Advised patient, providing education and rationale, to monitor blood pressure daily and record, calling PCP for findings outside established parameters;  Reviewed scheduled/upcoming provider appointments including: 12-15-2022 at 0940 am Advised patient to discuss changes in his HTN or  heart health with provider; Provided education on prescribed diet heart healthy diet;  Discussed complications of poorly controlled blood pressure such as heart disease, stroke, circulatory complications, vision complications, kidney impairment, sexual dysfunction;  Screening for signs and symptoms of depression related to chronic disease state;  Assessed social determinant of health barriers;  He is working with home health for 6 weeks. Has PT and a nurse coming out one time a week.   Symptom Management: Take medications as prescribed   Attend all scheduled provider appointments Call provider office for new concerns or questions  call the Suicide and Crisis Lifeline: 988 call the [place] National Suicide Prevention Lifeline: 240-514-0411 or TTY: 9067824069 TTY 309-073-4857) to talk to a trained counselor call 1-800-273-TALK (toll free, 24 hour hotline) go to [facility] [address] [place] [phone]) if experiencing a Mental Health or Behavioral Health Crisis  check blood pressure weekly learn about high blood  pressure call doctor for signs and symptoms of high blood pressure develop an action plan for high blood pressure keep all doctor appointments take medications for blood pressure exactly as prescribed report new symptoms to your doctor  Follow Up Plan: Telephone follow up appointment with care management team member scheduled for: 10-25-2022 at 1030 am       RNCM Care Management Expected Outcome:  Monitor, Self-Manage and Reduce Symptoms of Heart Failure       Current Barriers:  Knowledge Deficits related to the benefits of weighing daily to monitor for excess fluid and to prevent an exacerbation of HF Care Coordination needs related to education and support from pharm D for medication management and support  in a patient with HF Chronic Disease Management support and education needs related to effective management of HF Wt Readings from Last 3 Encounters:  08/25/22 210 lb (95.3 kg)  08/18/22 215 lb 6.4 oz (97.7 kg)  08/11/22 213 lb (96.6 kg)     Planned Interventions: Basic overview and discussion of pathophysiology of Heart Failure reviewed. The patient has been to the ER recently due to fluid overload. He states that he has not been taking his fluid pill. Education on calling his cardiologist and getting a follow up appointment. The patient is weighing daily and his weight is stable 210 to 213. Provided education on low sodium diet. The patient states he is mindful of sodium but his sodium levels have been low. Review of heart healthy diet  and will send information in the mail to the patient Reviewed Heart Failure Action Plan in depth and provided written copy Assessed need for readable accurate scales in home. The patient is weighing daily and denies any acute changes  Discussed importance of daily weight and advised patient to weigh and record daily. The patient states he can tell if he has more water weight on board by how hard it is to get his shoes on. He no longer wears socks.  Education on possibly monitoring his weight daily as this is an indicator of exacerbation of heart failure and may be one of the reason his is having more shortness of breath recently. Review of calling the provider for +2 or +3 pounds in one day and + 5 pounds in one week. Reminder provided today. Reviewed role of diuretics in prevention of fluid overload and management of heart failure. The patient states he has not been taking his Torsemide. Education on talking to his cardiologist about parameters to take his fluid pill.   Discussed the importance of keeping all appointments with provider Advised patient to discuss changes in fluid balance, changes in HF, questions, or concerns with provider Screening for signs and symptoms of depression related to chronic disease state  Assessed social determinant of health barriers EF 50 to 55% on 06-10-2021, education provided on what the EF% was and how it impacts his chronic conditions  Symptom Management: Take medications as prescribed   Attend all scheduled provider appointments Call provider office for new concerns or questions  call the Suicide and Crisis Lifeline: 988 call the [place] National Suicide Prevention Lifeline: 504-377-8025 or TTY: 8080765598 TTY [phone]) to talk to a trained counselor call 1-800-273-TALK (toll free, 24 hour hotline) go to [facility] [address] [place] [phone]) if experiencing a Mental Health or Behavioral Health Crisis  call office if I gain more than 2 pounds in one day or 5 pounds in one week use salt in moderation watch for swelling in feet, ankles and legs every day weigh myself daily develop a rescue plan follow rescue plan if symptoms flare-up  Follow Up Plan: Telephone follow up appointment with care management team member scheduled for: 10-25-2022 at 1030 am       RNCM Care Management:  Maintain, Monitor and Self-Manage Symptoms of COPD       Current  Barriers:  Knowledge Deficits related to the importance of following the plan of care and taking medications as directed for effective management of COPD Care Coordination needs related to pharmacy needs  in a patient with COPD Chronic Disease Management support and education needs related to effective management of COPD 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 The patient is having shortness of breath and is using his oxygen at night. The patient states that his breathing is better. Continues to work with PT and the nurse in his home. He feels this has been beneficial for him. Education and support given.  Advised patient to track and manage COPD triggers. Review of monitoring for changes that may exacerbate his COPD. The patient is aware and knows to watch for acute changes.  Provided written and verbal instructions on pursed lip breathing and utilized returned demonstration as teach back Provided  instruction about proper use of medications used for management of COPD including inhalers Advised patient to self assesses COPD action plan zone and make appointment with provider if in the yellow zone for 48 hours without improvement Advised patient to engage in light exercise as tolerated 3-5 days a week to aid in the the management of COPD Provided education about and advised patient to utilize infection prevention strategies to reduce risk of respiratory infection Discussed the importance of adequate rest and management of fatigue with COPD Screening for signs and symptoms of depression related to chronic disease state  Assessed social determinant of health barriers  Symptom Management: Take medications as prescribed   Attend all scheduled provider  appointments Call provider office for new concerns or questions  call the Suicide and Crisis Lifeline: 988 call the [place] National Suicide Prevention Lifeline: 641-677-2132 or TTY: 612-515-3914 TTY (807)459-0350) to talk to a trained counselor call 1-800-273-TALK (toll free, 24 hour hotline) go to [facility] [address], [place] [phone]) if experiencing a Mental Health or Behavioral Health Crisis  identify and remove indoor air pollutants limit outdoor activity during cold weather listen for public air quality announcements every day develop a rescue plan eliminate symptom triggers at home follow rescue plan if symptoms flare-up  Follow Up Plan: Telephone follow up appointment with care management team member scheduled for: 10-25-2022 at 1030 am           Consent to Services:  Patient was given information about care management services, agreed to services, and gave verbal consent to participate.   Plan: Telephone follow up appointment with care management team member scheduled for: 10-25-2022 at 1030 am  Alto Denver RN, MSN, CCM RN Care Manager  [facility]  Ambulatory Care Management  Direct Number: [phone]

## 2022-08-30 NOTE — Telephone Encounter (Signed)
Emma from [employer] requesting last ov notes in order to give medication. 08/18/2022 notes faxed to [phone]

## 2022-08-30 NOTE — Patient Instructions (Signed)
Visit Information  Thank you for taking time to visit with me today. Please don't hesitate to contact me if I can be of assistance to you before our next scheduled telephone appointment.  Following are the goals we discussed today:   Goals Addressed             This Visit's Progress    RNCM Care Management  Expected Outcome:  Monitor, Self-Manage and Reduce Symptoms of Afib       Current Barriers:  Chronic Disease Management support and education needs related to effective management of AFIB  Planned Interventions: Provider order and care plan reviewed. Collaborated with PharmD regarding patient care and plan. Works with pharm D on a regular basis. Saw pcp recently. No changes in the current plan of care.  Counseled on increased risk of stroke due to Afib and benefits of anticoagulation for stroke prevention           Reviewed importance of adherence to anticoagulant exactly as prescribed. Is compliant with medications. No recent changes in medications Advised patient to discuss changes in AFIB, questions or concerns with provider Counseled on bleeding risk associated with AFIB and importance of self-monitoring for signs/symptoms of bleeding Counseled on avoidance of NSAIDs due to increased bleeding risk with anticoagulants Counseled on importance of regular laboratory monitoring as prescribed. Has labs on a regular basis.  Counseled on seeking medical attention after a head injury or if there is blood in the urine/stool. Review on being safe and monitoring for acute changes Afib action plan reviewed Screening for signs and symptoms of depression related to chronic disease state Assessed social determinant of health barriers  Symptom Management: Take medications as prescribed   Attend all scheduled provider appointments Call provider office for new concerns or questions  call the Suicide and Crisis Lifeline: 988 call the [place] National Suicide Prevention Lifeline: 847 828 1841 or  TTY: 3152335561 TTY [phone]) to talk to a trained counselor call 1-800-273-TALK (toll free, 24 hour hotline) go to [facility] [address] [place] [phone]) if experiencing a Mental Health or Behavioral Health Crisis  - make a plan to eat healthy - keep all lab appointments - take medicine as prescribed  Follow Up Plan: Telephone follow up appointment with care management team member scheduled for: 10-25-2022 at 1030 am       RNCM Care Management  Expected Outcome:  Monitor, Self-Manage, and Reduce Symptoms of Hypertension       Current Barriers:  Knowledge Deficits related to the importance of calling the providers when out of medications or changes in HTN or heart health Care Coordination needs related to pharmacy support and education, referral pending in a patient with HTN and other chronic conditions Chronic Disease Management support and education needs related to effective management of HTN BP Readings from Last 3 Encounters:  08/25/22 (!) 110/50  08/18/22 128/60  08/11/22 130/62     Planned Interventions: Evaluation of current treatment plan related to hypertension self management and patient's adherence to plan as established by provider. The patient is having better blood pressures. Is having some swelling in his feet but has not been taking his fluid pill. Saw pcp on 08-25-2022. Denies any new concerns related to HTN or heart health. Discussed orthostatic hypotension.  Provided education to patient re: stroke prevention, s/s of heart attack and stroke; Reviewed prescribed diet heart healthy diet. The patient states that his sodium is low and they tell him it is okay to eat salt.  Does have issues with swelling and edema in his feet and legs. The patient states he and his son eat a lot of fast foods. Education on the benefits of eating fresh fruits and vegetables. Discussed cutting back on drinking beer.  Denies any acute changes  in his dietary habits.  Reviewed medications with patient and discussed importance of compliance. States he has not been taking his fluid pill. Encouraged him to talk with the provider about when to take his fluid pill. States he is doing well. Denies any acute changes in his medications. Saw pcp last week.  Discussed plans with patient for ongoing care management follow up and provided patient with direct contact information for care management team; Advised patient, providing education and rationale, to monitor blood pressure daily and record, calling PCP for findings outside established parameters;  Reviewed scheduled/upcoming provider appointments including: 12-15-2022 at 0940 am Advised patient to discuss changes in his HTN or  heart health with provider; Provided education on prescribed diet heart healthy diet;  Discussed complications of poorly controlled blood pressure such as heart disease, stroke, circulatory complications, vision complications, kidney impairment, sexual dysfunction;  Screening for signs and symptoms of depression related to chronic disease state;  Assessed social determinant of health barriers;  He is working with home health for 6 weeks. Has PT and a nurse coming out one time a week.   Symptom Management: Take medications as prescribed   Attend all scheduled provider appointments Call provider office for new concerns or questions  call the Suicide and Crisis Lifeline: 988 call the [place] National Suicide Prevention Lifeline: 610-734-0994 or TTY: 585-435-7248 TTY 856-107-2327) to talk to a trained counselor call 1-800-273-TALK (toll free, 24 hour hotline) go to [facility] [address] [place] [phone]) if experiencing a Mental Health or Behavioral Health Crisis  check blood pressure weekly learn about high blood pressure call doctor for signs and symptoms of high blood pressure develop an action plan for high blood  pressure keep all doctor appointments take medications for blood pressure exactly as prescribed report new symptoms to your doctor  Follow Up Plan: Telephone follow up appointment with care management team member scheduled for: 10-25-2022 at 1030 am       RNCM Care Management Expected Outcome:  Monitor, Self-Manage and Reduce Symptoms of Heart Failure       Current Barriers:  Knowledge Deficits related to the benefits of weighing daily to monitor for excess fluid and to prevent an exacerbation of HF Care Coordination needs related to education and support from pharm D for medication management and support  in a patient with HF Chronic Disease Management support and education needs related to effective management of HF Wt Readings from Last 3 Encounters:  08/25/22 210 lb (95.3 kg)  08/18/22 215 lb 6.4 oz (97.7 kg)  08/11/22 213 lb (96.6 kg)     Planned Interventions: Basic overview and discussion of pathophysiology of Heart Failure reviewed. The patient has been to the ER recently due to fluid overload. He states that he has not been taking his fluid pill. Education on calling his cardiologist and getting a follow up appointment. The patient is weighing daily and his weight is stable 210 to 213. Provided education on low sodium diet. The patient states he is mindful of sodium but his sodium levels have been low. Review of heart healthy diet and will send information in the mail to the patient Reviewed Heart Failure Action Plan in depth and provided  written copy Assessed need for readable accurate scales in home. The patient is weighing daily and denies any acute changes  Discussed importance of daily weight and advised patient to weigh and record daily. The patient states he can tell if he has more water weight on board by how hard it is to get his shoes on. He no longer wears socks. Education on possibly monitoring his weight daily as this is an indicator of exacerbation of heart failure and  may be one of the reason his is having more shortness of breath recently. Review of calling the provider for +2 or +3 pounds in one day and + 5 pounds in one week. Reminder provided today. Reviewed role of diuretics in prevention of fluid overload and management of heart failure. The patient states he has not been taking his Torsemide. Education on talking to his cardiologist about parameters to take his fluid pill.   Discussed the importance of keeping all appointments with provider Advised patient to discuss changes in fluid balance, changes in HF, questions, or concerns with provider Screening for signs and symptoms of depression related to chronic disease state  Assessed social determinant of health barriers EF 50 to 55% on 06-10-2021, education provided on what the EF% was and how it impacts his chronic conditions  Symptom Management: Take medications as prescribed   Attend all scheduled provider appointments Call provider office for new concerns or questions  call the Suicide and Crisis Lifeline: 988 call the [place] National Suicide Prevention Lifeline: 769 780 5871 or TTY: (706)532-3246 TTY 681-196-8700) to talk to a trained counselor call 1-800-273-TALK (toll free, 24 hour hotline) go to [facility] [address] [place] [phone]) if experiencing a Mental Health or Behavioral Health Crisis  call office if I gain more than 2 pounds in one day or 5 pounds in one week use salt in moderation watch for swelling in feet, ankles and legs every day weigh myself daily develop a rescue plan follow rescue plan if symptoms flare-up  Follow Up Plan: Telephone follow up appointment with care management team member scheduled for: 10-25-2022 at 1030 am       RNCM Care Management:  Maintain, Monitor and Self-Manage Symptoms of COPD       Current Barriers:  Knowledge Deficits related to the importance of following the plan of care and taking medications  as directed for effective management of COPD Care Coordination needs related to pharmacy needs  in a patient with COPD Chronic Disease Management support and education needs related to effective management of COPD 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 The patient is having shortness of breath and is using his oxygen at night. The patient states that his breathing is better. Continues to work with PT and the nurse in his home. He feels this has been beneficial for him. Education and support given.  Advised patient to track and manage COPD triggers. Review of monitoring for changes that may exacerbate his COPD. The patient is aware and knows to watch for acute changes.  Provided written and verbal instructions on pursed lip breathing and utilized returned demonstration as teach back Provided instruction about proper use of medications used for management of COPD including inhalers Advised patient to self assesses COPD  action plan zone and make appointment with provider if in the yellow zone for 48 hours without improvement Advised patient to engage in light exercise as tolerated 3-5 days a week to aid in the the management of COPD Provided education about and advised patient to utilize infection prevention strategies to reduce risk of respiratory infection Discussed the importance of adequate rest and management of fatigue with COPD Screening for signs and symptoms of depression related to chronic disease state  Assessed social determinant of health barriers  Symptom Management: Take medications as prescribed   Attend all scheduled provider appointments Call provider office for new concerns or questions  call the Suicide and Crisis Lifeline:  988 call the [place] National Suicide Prevention Lifeline: 804-209-6981 or TTY: 778-859-4700 TTY 318-604-0382) to talk to a trained counselor call 1-800-273-TALK (toll free, 24 hour hotline) go to [facility] [address], [place] [phone]) if experiencing a Mental Health or Behavioral Health Crisis  identify and remove indoor air pollutants limit outdoor activity during cold weather listen for public air quality announcements every day develop a rescue plan eliminate symptom triggers at home follow rescue plan if symptoms flare-up  Follow Up Plan: Telephone follow up appointment with care management team member scheduled for: 10-25-2022 at 1030 am           Our next appointment is by telephone on 10-25-2022 at 1030 am  Please call the care guide team at [phone] if you need to cancel or reschedule your appointment.   If you are experiencing a Mental Health or Behavioral Health Crisis or need someone to talk to, please call the Suicide and Crisis Lifeline: 988 call the [place] National Suicide Prevention Lifeline: 651-580-2200 or TTY: 435-847-8021 TTY 778-747-9357) to talk to a trained counselor call 1-800-273-TALK (toll free, 24 hour hotline) go to [facility] [address], [place] [phone])   The patient verbalized understanding of instructions, educational materials, and care plan provided today and DECLINED offer to receive copy of patient instructions, educational materials, and care plan.     Alto Denver RN, MSN, CCM RN Care Manager  [facility]  Ambulatory Care Management  Direct Number: [phone]

## 2022-09-01 DIAGNOSIS — I714 Abdominal aortic aneurysm, without rupture, unspecified: Secondary | ICD-10-CM | POA: Diagnosis not present

## 2022-09-01 DIAGNOSIS — J441 Chronic obstructive pulmonary disease with (acute) exacerbation: Secondary | ICD-10-CM | POA: Diagnosis not present

## 2022-09-01 DIAGNOSIS — J440 Chronic obstructive pulmonary disease with acute lower respiratory infection: Secondary | ICD-10-CM | POA: Diagnosis not present

## 2022-09-01 DIAGNOSIS — Z7901 Long term (current) use of anticoagulants: Secondary | ICD-10-CM | POA: Diagnosis not present

## 2022-09-01 DIAGNOSIS — E785 Hyperlipidemia, unspecified: Secondary | ICD-10-CM | POA: Diagnosis not present

## 2022-09-01 DIAGNOSIS — I5032 Chronic diastolic (congestive) heart failure: Secondary | ICD-10-CM | POA: Diagnosis not present

## 2022-09-01 DIAGNOSIS — D649 Anemia, unspecified: Secondary | ICD-10-CM | POA: Diagnosis not present

## 2022-09-01 DIAGNOSIS — J9621 Acute and chronic respiratory failure with hypoxia: Secondary | ICD-10-CM | POA: Diagnosis not present

## 2022-09-01 DIAGNOSIS — I959 Hypotension, unspecified: Secondary | ICD-10-CM | POA: Diagnosis not present

## 2022-09-01 DIAGNOSIS — J188 Other pneumonia, unspecified organism: Secondary | ICD-10-CM | POA: Diagnosis not present

## 2022-09-01 DIAGNOSIS — Z6831 Body mass index (BMI) 31.0-31.9, adult: Secondary | ICD-10-CM | POA: Diagnosis not present

## 2022-09-01 DIAGNOSIS — I480 Paroxysmal atrial fibrillation: Secondary | ICD-10-CM | POA: Diagnosis not present

## 2022-09-01 DIAGNOSIS — F10239 Alcohol dependence with withdrawal, unspecified: Secondary | ICD-10-CM | POA: Diagnosis not present

## 2022-09-01 DIAGNOSIS — N400 Enlarged prostate without lower urinary tract symptoms: Secondary | ICD-10-CM | POA: Diagnosis not present

## 2022-09-01 DIAGNOSIS — Z7951 Long term (current) use of inhaled steroids: Secondary | ICD-10-CM | POA: Diagnosis not present

## 2022-09-01 DIAGNOSIS — Z9981 Dependence on supplemental oxygen: Secondary | ICD-10-CM | POA: Diagnosis not present

## 2022-09-01 DIAGNOSIS — F17210 Nicotine dependence, cigarettes, uncomplicated: Secondary | ICD-10-CM | POA: Diagnosis not present

## 2022-09-01 DIAGNOSIS — E119 Type 2 diabetes mellitus without complications: Secondary | ICD-10-CM | POA: Diagnosis not present

## 2022-09-01 DIAGNOSIS — E669 Obesity, unspecified: Secondary | ICD-10-CM | POA: Diagnosis not present

## 2022-09-01 DIAGNOSIS — Z7984 Long term (current) use of oral hypoglycemic drugs: Secondary | ICD-10-CM | POA: Diagnosis not present

## 2022-09-07 DIAGNOSIS — F17210 Nicotine dependence, cigarettes, uncomplicated: Secondary | ICD-10-CM | POA: Diagnosis not present

## 2022-09-07 DIAGNOSIS — J9621 Acute and chronic respiratory failure with hypoxia: Secondary | ICD-10-CM | POA: Diagnosis not present

## 2022-09-07 DIAGNOSIS — Z7901 Long term (current) use of anticoagulants: Secondary | ICD-10-CM | POA: Diagnosis not present

## 2022-09-07 DIAGNOSIS — J440 Chronic obstructive pulmonary disease with acute lower respiratory infection: Secondary | ICD-10-CM | POA: Diagnosis not present

## 2022-09-07 DIAGNOSIS — E785 Hyperlipidemia, unspecified: Secondary | ICD-10-CM | POA: Diagnosis not present

## 2022-09-07 DIAGNOSIS — E669 Obesity, unspecified: Secondary | ICD-10-CM | POA: Diagnosis not present

## 2022-09-07 DIAGNOSIS — E119 Type 2 diabetes mellitus without complications: Secondary | ICD-10-CM | POA: Diagnosis not present

## 2022-09-07 DIAGNOSIS — I480 Paroxysmal atrial fibrillation: Secondary | ICD-10-CM | POA: Diagnosis not present

## 2022-09-07 DIAGNOSIS — F10239 Alcohol dependence with withdrawal, unspecified: Secondary | ICD-10-CM | POA: Diagnosis not present

## 2022-09-07 DIAGNOSIS — I5032 Chronic diastolic (congestive) heart failure: Secondary | ICD-10-CM | POA: Diagnosis not present

## 2022-09-07 DIAGNOSIS — N400 Enlarged prostate without lower urinary tract symptoms: Secondary | ICD-10-CM | POA: Diagnosis not present

## 2022-09-07 DIAGNOSIS — J441 Chronic obstructive pulmonary disease with (acute) exacerbation: Secondary | ICD-10-CM | POA: Diagnosis not present

## 2022-09-07 DIAGNOSIS — D649 Anemia, unspecified: Secondary | ICD-10-CM | POA: Diagnosis not present

## 2022-09-07 DIAGNOSIS — Z7984 Long term (current) use of oral hypoglycemic drugs: Secondary | ICD-10-CM | POA: Diagnosis not present

## 2022-09-07 DIAGNOSIS — Z7951 Long term (current) use of inhaled steroids: Secondary | ICD-10-CM | POA: Diagnosis not present

## 2022-09-07 DIAGNOSIS — Z9981 Dependence on supplemental oxygen: Secondary | ICD-10-CM | POA: Diagnosis not present

## 2022-09-07 DIAGNOSIS — Z6831 Body mass index (BMI) 31.0-31.9, adult: Secondary | ICD-10-CM | POA: Diagnosis not present

## 2022-09-07 DIAGNOSIS — I714 Abdominal aortic aneurysm, without rupture, unspecified: Secondary | ICD-10-CM | POA: Diagnosis not present

## 2022-09-07 DIAGNOSIS — J188 Other pneumonia, unspecified organism: Secondary | ICD-10-CM | POA: Diagnosis not present

## 2022-09-07 DIAGNOSIS — I959 Hypotension, unspecified: Secondary | ICD-10-CM | POA: Diagnosis not present

## 2022-09-08 ENCOUNTER — Telehealth: Payer: Self-pay

## 2022-09-08 DIAGNOSIS — I714 Abdominal aortic aneurysm, without rupture, unspecified: Secondary | ICD-10-CM | POA: Diagnosis not present

## 2022-09-08 DIAGNOSIS — Z9981 Dependence on supplemental oxygen: Secondary | ICD-10-CM | POA: Diagnosis not present

## 2022-09-08 DIAGNOSIS — I5032 Chronic diastolic (congestive) heart failure: Secondary | ICD-10-CM | POA: Diagnosis not present

## 2022-09-08 DIAGNOSIS — I480 Paroxysmal atrial fibrillation: Secondary | ICD-10-CM | POA: Diagnosis not present

## 2022-09-08 DIAGNOSIS — I959 Hypotension, unspecified: Secondary | ICD-10-CM | POA: Diagnosis not present

## 2022-09-08 DIAGNOSIS — J440 Chronic obstructive pulmonary disease with acute lower respiratory infection: Secondary | ICD-10-CM | POA: Diagnosis not present

## 2022-09-08 DIAGNOSIS — Z7951 Long term (current) use of inhaled steroids: Secondary | ICD-10-CM | POA: Diagnosis not present

## 2022-09-08 DIAGNOSIS — J441 Chronic obstructive pulmonary disease with (acute) exacerbation: Secondary | ICD-10-CM | POA: Diagnosis not present

## 2022-09-08 DIAGNOSIS — E669 Obesity, unspecified: Secondary | ICD-10-CM | POA: Diagnosis not present

## 2022-09-08 DIAGNOSIS — E785 Hyperlipidemia, unspecified: Secondary | ICD-10-CM | POA: Diagnosis not present

## 2022-09-08 DIAGNOSIS — J9621 Acute and chronic respiratory failure with hypoxia: Secondary | ICD-10-CM | POA: Diagnosis not present

## 2022-09-08 DIAGNOSIS — N400 Enlarged prostate without lower urinary tract symptoms: Secondary | ICD-10-CM | POA: Diagnosis not present

## 2022-09-08 DIAGNOSIS — E119 Type 2 diabetes mellitus without complications: Secondary | ICD-10-CM | POA: Diagnosis not present

## 2022-09-08 DIAGNOSIS — D649 Anemia, unspecified: Secondary | ICD-10-CM | POA: Diagnosis not present

## 2022-09-08 DIAGNOSIS — F17210 Nicotine dependence, cigarettes, uncomplicated: Secondary | ICD-10-CM | POA: Diagnosis not present

## 2022-09-08 DIAGNOSIS — Z6831 Body mass index (BMI) 31.0-31.9, adult: Secondary | ICD-10-CM | POA: Diagnosis not present

## 2022-09-08 DIAGNOSIS — Z7901 Long term (current) use of anticoagulants: Secondary | ICD-10-CM | POA: Diagnosis not present

## 2022-09-08 DIAGNOSIS — J188 Other pneumonia, unspecified organism: Secondary | ICD-10-CM | POA: Diagnosis not present

## 2022-09-08 DIAGNOSIS — Z7984 Long term (current) use of oral hypoglycemic drugs: Secondary | ICD-10-CM | POA: Diagnosis not present

## 2022-09-08 DIAGNOSIS — F10239 Alcohol dependence with withdrawal, unspecified: Secondary | ICD-10-CM | POA: Diagnosis not present

## 2022-09-08 NOTE — Telephone Encounter (Signed)
[facility] POC: [facility] HOME HEALTH ORDER #: [id number]

## 2022-09-09 DIAGNOSIS — M1990 Unspecified osteoarthritis, unspecified site: Secondary | ICD-10-CM | POA: Diagnosis not present

## 2022-09-09 DIAGNOSIS — N1831 Chronic kidney disease, stage 3a: Secondary | ICD-10-CM | POA: Diagnosis not present

## 2022-09-09 DIAGNOSIS — N529 Male erectile dysfunction, unspecified: Secondary | ICD-10-CM | POA: Diagnosis not present

## 2022-09-09 DIAGNOSIS — E039 Hypothyroidism, unspecified: Secondary | ICD-10-CM | POA: Diagnosis not present

## 2022-09-09 DIAGNOSIS — D6869 Other thrombophilia: Secondary | ICD-10-CM | POA: Diagnosis not present

## 2022-09-09 DIAGNOSIS — J309 Allergic rhinitis, unspecified: Secondary | ICD-10-CM | POA: Diagnosis not present

## 2022-09-09 DIAGNOSIS — E669 Obesity, unspecified: Secondary | ICD-10-CM | POA: Diagnosis not present

## 2022-09-09 DIAGNOSIS — I509 Heart failure, unspecified: Secondary | ICD-10-CM | POA: Diagnosis not present

## 2022-09-09 DIAGNOSIS — E785 Hyperlipidemia, unspecified: Secondary | ICD-10-CM | POA: Diagnosis not present

## 2022-09-09 DIAGNOSIS — I4891 Unspecified atrial fibrillation: Secondary | ICD-10-CM | POA: Diagnosis not present

## 2022-09-09 DIAGNOSIS — K219 Gastro-esophageal reflux disease without esophagitis: Secondary | ICD-10-CM | POA: Diagnosis not present

## 2022-09-09 DIAGNOSIS — I25119 Atherosclerotic heart disease of native coronary artery with unspecified angina pectoris: Secondary | ICD-10-CM | POA: Diagnosis not present

## 2022-09-13 ENCOUNTER — Telehealth: Payer: Self-pay | Admitting: Family Medicine

## 2022-09-13 NOTE — Telephone Encounter (Signed)
[facility] Health - order # [id number]

## 2022-09-16 DIAGNOSIS — J449 Chronic obstructive pulmonary disease, unspecified: Secondary | ICD-10-CM | POA: Diagnosis not present

## 2022-09-19 DIAGNOSIS — J449 Chronic obstructive pulmonary disease, unspecified: Secondary | ICD-10-CM | POA: Diagnosis not present

## 2022-09-21 ENCOUNTER — Other Ambulatory Visit: Payer: Self-pay | Admitting: Family Medicine

## 2022-09-21 ENCOUNTER — Other Ambulatory Visit: Payer: Self-pay

## 2022-09-21 DIAGNOSIS — Z7951 Long term (current) use of inhaled steroids: Secondary | ICD-10-CM | POA: Diagnosis not present

## 2022-09-21 DIAGNOSIS — D649 Anemia, unspecified: Secondary | ICD-10-CM | POA: Diagnosis not present

## 2022-09-21 DIAGNOSIS — I5032 Chronic diastolic (congestive) heart failure: Secondary | ICD-10-CM | POA: Diagnosis not present

## 2022-09-21 DIAGNOSIS — Z7901 Long term (current) use of anticoagulants: Secondary | ICD-10-CM | POA: Diagnosis not present

## 2022-09-21 DIAGNOSIS — E119 Type 2 diabetes mellitus without complications: Secondary | ICD-10-CM | POA: Diagnosis not present

## 2022-09-21 DIAGNOSIS — J440 Chronic obstructive pulmonary disease with acute lower respiratory infection: Secondary | ICD-10-CM | POA: Diagnosis not present

## 2022-09-21 DIAGNOSIS — Z9981 Dependence on supplemental oxygen: Secondary | ICD-10-CM | POA: Diagnosis not present

## 2022-09-21 DIAGNOSIS — I714 Abdominal aortic aneurysm, without rupture, unspecified: Secondary | ICD-10-CM | POA: Diagnosis not present

## 2022-09-21 DIAGNOSIS — Z6831 Body mass index (BMI) 31.0-31.9, adult: Secondary | ICD-10-CM | POA: Diagnosis not present

## 2022-09-21 DIAGNOSIS — Z7984 Long term (current) use of oral hypoglycemic drugs: Secondary | ICD-10-CM | POA: Diagnosis not present

## 2022-09-21 DIAGNOSIS — F17210 Nicotine dependence, cigarettes, uncomplicated: Secondary | ICD-10-CM | POA: Diagnosis not present

## 2022-09-21 DIAGNOSIS — I959 Hypotension, unspecified: Secondary | ICD-10-CM | POA: Diagnosis not present

## 2022-09-21 DIAGNOSIS — E785 Hyperlipidemia, unspecified: Secondary | ICD-10-CM | POA: Diagnosis not present

## 2022-09-21 DIAGNOSIS — J441 Chronic obstructive pulmonary disease with (acute) exacerbation: Secondary | ICD-10-CM | POA: Diagnosis not present

## 2022-09-21 DIAGNOSIS — F10239 Alcohol dependence with withdrawal, unspecified: Secondary | ICD-10-CM | POA: Diagnosis not present

## 2022-09-21 DIAGNOSIS — N400 Enlarged prostate without lower urinary tract symptoms: Secondary | ICD-10-CM | POA: Diagnosis not present

## 2022-09-21 DIAGNOSIS — I480 Paroxysmal atrial fibrillation: Secondary | ICD-10-CM | POA: Diagnosis not present

## 2022-09-21 DIAGNOSIS — J9621 Acute and chronic respiratory failure with hypoxia: Secondary | ICD-10-CM | POA: Diagnosis not present

## 2022-09-21 DIAGNOSIS — E669 Obesity, unspecified: Secondary | ICD-10-CM | POA: Diagnosis not present

## 2022-09-21 DIAGNOSIS — J188 Other pneumonia, unspecified organism: Secondary | ICD-10-CM | POA: Diagnosis not present

## 2022-09-21 MED ORDER — ALBUTEROL SULFATE HFA 108 (90 BASE) MCG/ACT IN AERS: 2.0000 | INHALATION_SPRAY | RESPIRATORY_TRACT | 0 refills | Status: DC | PRN

## 2022-09-22 DIAGNOSIS — F10239 Alcohol dependence with withdrawal, unspecified: Secondary | ICD-10-CM | POA: Diagnosis not present

## 2022-09-22 DIAGNOSIS — I480 Paroxysmal atrial fibrillation: Secondary | ICD-10-CM | POA: Diagnosis not present

## 2022-09-22 DIAGNOSIS — I5032 Chronic diastolic (congestive) heart failure: Secondary | ICD-10-CM | POA: Diagnosis not present

## 2022-09-22 DIAGNOSIS — J440 Chronic obstructive pulmonary disease with acute lower respiratory infection: Secondary | ICD-10-CM | POA: Diagnosis not present

## 2022-09-22 DIAGNOSIS — Z7984 Long term (current) use of oral hypoglycemic drugs: Secondary | ICD-10-CM | POA: Diagnosis not present

## 2022-09-22 DIAGNOSIS — N400 Enlarged prostate without lower urinary tract symptoms: Secondary | ICD-10-CM | POA: Diagnosis not present

## 2022-09-22 DIAGNOSIS — I959 Hypotension, unspecified: Secondary | ICD-10-CM | POA: Diagnosis not present

## 2022-09-22 DIAGNOSIS — J441 Chronic obstructive pulmonary disease with (acute) exacerbation: Secondary | ICD-10-CM | POA: Diagnosis not present

## 2022-09-22 DIAGNOSIS — I714 Abdominal aortic aneurysm, without rupture, unspecified: Secondary | ICD-10-CM | POA: Diagnosis not present

## 2022-09-22 DIAGNOSIS — J9621 Acute and chronic respiratory failure with hypoxia: Secondary | ICD-10-CM | POA: Diagnosis not present

## 2022-09-22 DIAGNOSIS — F17210 Nicotine dependence, cigarettes, uncomplicated: Secondary | ICD-10-CM | POA: Diagnosis not present

## 2022-09-22 DIAGNOSIS — E119 Type 2 diabetes mellitus without complications: Secondary | ICD-10-CM | POA: Diagnosis not present

## 2022-09-22 DIAGNOSIS — J188 Other pneumonia, unspecified organism: Secondary | ICD-10-CM | POA: Diagnosis not present

## 2022-09-22 DIAGNOSIS — E669 Obesity, unspecified: Secondary | ICD-10-CM | POA: Diagnosis not present

## 2022-09-22 DIAGNOSIS — Z6831 Body mass index (BMI) 31.0-31.9, adult: Secondary | ICD-10-CM | POA: Diagnosis not present

## 2022-09-22 DIAGNOSIS — Z9981 Dependence on supplemental oxygen: Secondary | ICD-10-CM | POA: Diagnosis not present

## 2022-09-22 DIAGNOSIS — D649 Anemia, unspecified: Secondary | ICD-10-CM | POA: Diagnosis not present

## 2022-09-22 DIAGNOSIS — E785 Hyperlipidemia, unspecified: Secondary | ICD-10-CM | POA: Diagnosis not present

## 2022-09-22 DIAGNOSIS — Z7901 Long term (current) use of anticoagulants: Secondary | ICD-10-CM | POA: Diagnosis not present

## 2022-09-22 DIAGNOSIS — Z7951 Long term (current) use of inhaled steroids: Secondary | ICD-10-CM | POA: Diagnosis not present

## 2022-09-28 DIAGNOSIS — J440 Chronic obstructive pulmonary disease with acute lower respiratory infection: Secondary | ICD-10-CM | POA: Diagnosis not present

## 2022-09-28 DIAGNOSIS — J188 Other pneumonia, unspecified organism: Secondary | ICD-10-CM | POA: Diagnosis not present

## 2022-09-28 DIAGNOSIS — J9621 Acute and chronic respiratory failure with hypoxia: Secondary | ICD-10-CM | POA: Diagnosis not present

## 2022-09-28 DIAGNOSIS — D649 Anemia, unspecified: Secondary | ICD-10-CM | POA: Diagnosis not present

## 2022-09-28 DIAGNOSIS — J441 Chronic obstructive pulmonary disease with (acute) exacerbation: Secondary | ICD-10-CM | POA: Diagnosis not present

## 2022-09-28 DIAGNOSIS — Z7951 Long term (current) use of inhaled steroids: Secondary | ICD-10-CM | POA: Diagnosis not present

## 2022-09-28 DIAGNOSIS — Z9981 Dependence on supplemental oxygen: Secondary | ICD-10-CM | POA: Diagnosis not present

## 2022-09-28 DIAGNOSIS — I714 Abdominal aortic aneurysm, without rupture, unspecified: Secondary | ICD-10-CM | POA: Diagnosis not present

## 2022-09-28 DIAGNOSIS — F10239 Alcohol dependence with withdrawal, unspecified: Secondary | ICD-10-CM | POA: Diagnosis not present

## 2022-09-28 DIAGNOSIS — I959 Hypotension, unspecified: Secondary | ICD-10-CM | POA: Diagnosis not present

## 2022-09-28 DIAGNOSIS — F17210 Nicotine dependence, cigarettes, uncomplicated: Secondary | ICD-10-CM | POA: Diagnosis not present

## 2022-09-28 DIAGNOSIS — Z7984 Long term (current) use of oral hypoglycemic drugs: Secondary | ICD-10-CM | POA: Diagnosis not present

## 2022-09-28 DIAGNOSIS — Z6831 Body mass index (BMI) 31.0-31.9, adult: Secondary | ICD-10-CM | POA: Diagnosis not present

## 2022-09-28 DIAGNOSIS — Z7901 Long term (current) use of anticoagulants: Secondary | ICD-10-CM | POA: Diagnosis not present

## 2022-09-28 DIAGNOSIS — E669 Obesity, unspecified: Secondary | ICD-10-CM | POA: Diagnosis not present

## 2022-09-28 DIAGNOSIS — E119 Type 2 diabetes mellitus without complications: Secondary | ICD-10-CM | POA: Diagnosis not present

## 2022-09-28 DIAGNOSIS — E785 Hyperlipidemia, unspecified: Secondary | ICD-10-CM | POA: Diagnosis not present

## 2022-09-28 DIAGNOSIS — I5032 Chronic diastolic (congestive) heart failure: Secondary | ICD-10-CM | POA: Diagnosis not present

## 2022-09-28 DIAGNOSIS — I480 Paroxysmal atrial fibrillation: Secondary | ICD-10-CM | POA: Diagnosis not present

## 2022-09-28 DIAGNOSIS — N400 Enlarged prostate without lower urinary tract symptoms: Secondary | ICD-10-CM | POA: Diagnosis not present

## 2022-10-07 DIAGNOSIS — J440 Chronic obstructive pulmonary disease with acute lower respiratory infection: Secondary | ICD-10-CM | POA: Diagnosis not present

## 2022-10-07 DIAGNOSIS — I5032 Chronic diastolic (congestive) heart failure: Secondary | ICD-10-CM | POA: Diagnosis not present

## 2022-10-07 DIAGNOSIS — E669 Obesity, unspecified: Secondary | ICD-10-CM | POA: Diagnosis not present

## 2022-10-07 DIAGNOSIS — I714 Abdominal aortic aneurysm, without rupture, unspecified: Secondary | ICD-10-CM | POA: Diagnosis not present

## 2022-10-07 DIAGNOSIS — J441 Chronic obstructive pulmonary disease with (acute) exacerbation: Secondary | ICD-10-CM | POA: Diagnosis not present

## 2022-10-07 DIAGNOSIS — Z6831 Body mass index (BMI) 31.0-31.9, adult: Secondary | ICD-10-CM | POA: Diagnosis not present

## 2022-10-07 DIAGNOSIS — Z9981 Dependence on supplemental oxygen: Secondary | ICD-10-CM | POA: Diagnosis not present

## 2022-10-07 DIAGNOSIS — D649 Anemia, unspecified: Secondary | ICD-10-CM | POA: Diagnosis not present

## 2022-10-07 DIAGNOSIS — E119 Type 2 diabetes mellitus without complications: Secondary | ICD-10-CM | POA: Diagnosis not present

## 2022-10-07 DIAGNOSIS — Z7951 Long term (current) use of inhaled steroids: Secondary | ICD-10-CM | POA: Diagnosis not present

## 2022-10-07 DIAGNOSIS — Z7984 Long term (current) use of oral hypoglycemic drugs: Secondary | ICD-10-CM | POA: Diagnosis not present

## 2022-10-07 DIAGNOSIS — F10239 Alcohol dependence with withdrawal, unspecified: Secondary | ICD-10-CM | POA: Diagnosis not present

## 2022-10-07 DIAGNOSIS — I480 Paroxysmal atrial fibrillation: Secondary | ICD-10-CM | POA: Diagnosis not present

## 2022-10-07 DIAGNOSIS — Z7901 Long term (current) use of anticoagulants: Secondary | ICD-10-CM | POA: Diagnosis not present

## 2022-10-07 DIAGNOSIS — J188 Other pneumonia, unspecified organism: Secondary | ICD-10-CM | POA: Diagnosis not present

## 2022-10-07 DIAGNOSIS — E785 Hyperlipidemia, unspecified: Secondary | ICD-10-CM | POA: Diagnosis not present

## 2022-10-07 DIAGNOSIS — F17210 Nicotine dependence, cigarettes, uncomplicated: Secondary | ICD-10-CM | POA: Diagnosis not present

## 2022-10-07 DIAGNOSIS — I959 Hypotension, unspecified: Secondary | ICD-10-CM | POA: Diagnosis not present

## 2022-10-07 DIAGNOSIS — J9621 Acute and chronic respiratory failure with hypoxia: Secondary | ICD-10-CM | POA: Diagnosis not present

## 2022-10-07 DIAGNOSIS — N400 Enlarged prostate without lower urinary tract symptoms: Secondary | ICD-10-CM | POA: Diagnosis not present

## 2022-10-12 DIAGNOSIS — J9621 Acute and chronic respiratory failure with hypoxia: Secondary | ICD-10-CM | POA: Diagnosis not present

## 2022-10-12 DIAGNOSIS — Z9981 Dependence on supplemental oxygen: Secondary | ICD-10-CM | POA: Diagnosis not present

## 2022-10-12 DIAGNOSIS — N400 Enlarged prostate without lower urinary tract symptoms: Secondary | ICD-10-CM | POA: Diagnosis not present

## 2022-10-12 DIAGNOSIS — I714 Abdominal aortic aneurysm, without rupture, unspecified: Secondary | ICD-10-CM | POA: Diagnosis not present

## 2022-10-12 DIAGNOSIS — J441 Chronic obstructive pulmonary disease with (acute) exacerbation: Secondary | ICD-10-CM | POA: Diagnosis not present

## 2022-10-12 DIAGNOSIS — E119 Type 2 diabetes mellitus without complications: Secondary | ICD-10-CM | POA: Diagnosis not present

## 2022-10-12 DIAGNOSIS — Z7951 Long term (current) use of inhaled steroids: Secondary | ICD-10-CM | POA: Diagnosis not present

## 2022-10-12 DIAGNOSIS — F10239 Alcohol dependence with withdrawal, unspecified: Secondary | ICD-10-CM | POA: Diagnosis not present

## 2022-10-12 DIAGNOSIS — Z7984 Long term (current) use of oral hypoglycemic drugs: Secondary | ICD-10-CM | POA: Diagnosis not present

## 2022-10-12 DIAGNOSIS — J188 Other pneumonia, unspecified organism: Secondary | ICD-10-CM | POA: Diagnosis not present

## 2022-10-12 DIAGNOSIS — I5032 Chronic diastolic (congestive) heart failure: Secondary | ICD-10-CM | POA: Diagnosis not present

## 2022-10-12 DIAGNOSIS — J440 Chronic obstructive pulmonary disease with acute lower respiratory infection: Secondary | ICD-10-CM | POA: Diagnosis not present

## 2022-10-12 DIAGNOSIS — Z7901 Long term (current) use of anticoagulants: Secondary | ICD-10-CM | POA: Diagnosis not present

## 2022-10-12 DIAGNOSIS — Z6831 Body mass index (BMI) 31.0-31.9, adult: Secondary | ICD-10-CM | POA: Diagnosis not present

## 2022-10-12 DIAGNOSIS — E785 Hyperlipidemia, unspecified: Secondary | ICD-10-CM | POA: Diagnosis not present

## 2022-10-12 DIAGNOSIS — F17210 Nicotine dependence, cigarettes, uncomplicated: Secondary | ICD-10-CM | POA: Diagnosis not present

## 2022-10-12 DIAGNOSIS — E669 Obesity, unspecified: Secondary | ICD-10-CM | POA: Diagnosis not present

## 2022-10-12 DIAGNOSIS — D649 Anemia, unspecified: Secondary | ICD-10-CM | POA: Diagnosis not present

## 2022-10-12 DIAGNOSIS — I959 Hypotension, unspecified: Secondary | ICD-10-CM | POA: Diagnosis not present

## 2022-10-12 DIAGNOSIS — I480 Paroxysmal atrial fibrillation: Secondary | ICD-10-CM | POA: Diagnosis not present

## 2022-10-13 ENCOUNTER — Encounter: Payer: Self-pay | Admitting: Physician Assistant

## 2022-10-13 ENCOUNTER — Ambulatory Visit: Payer: Medicare HMO

## 2022-10-13 ENCOUNTER — Ambulatory Visit: Payer: Medicare HMO | Admitting: Physician Assistant

## 2022-10-13 VITALS — BP 130/60 | HR 87 | Temp 97.3°F | Resp 16 | Ht 67.5 in | Wt 223.0 lb

## 2022-10-13 DIAGNOSIS — J449 Chronic obstructive pulmonary disease, unspecified: Secondary | ICD-10-CM

## 2022-10-13 DIAGNOSIS — I517 Cardiomegaly: Secondary | ICD-10-CM | POA: Diagnosis not present

## 2022-10-13 DIAGNOSIS — R0602 Shortness of breath: Secondary | ICD-10-CM | POA: Diagnosis not present

## 2022-10-13 DIAGNOSIS — Z7951 Long term (current) use of inhaled steroids: Secondary | ICD-10-CM

## 2022-10-13 DIAGNOSIS — R059 Cough, unspecified: Secondary | ICD-10-CM | POA: Diagnosis not present

## 2022-10-13 DIAGNOSIS — R7309 Other abnormal glucose: Secondary | ICD-10-CM

## 2022-10-13 DIAGNOSIS — J439 Emphysema, unspecified: Secondary | ICD-10-CM | POA: Diagnosis not present

## 2022-10-13 LAB — POCT INFLUENZA A/B
Influenza A, POC: NEGATIVE
Influenza B, POC: NEGATIVE

## 2022-10-13 LAB — POC COVID19 BINAXNOW: SARS Coronavirus 2 Ag: NEGATIVE

## 2022-10-13 MED ORDER — PREDNISONE 20 MG PO TABS
ORAL_TABLET | ORAL | 0 refills | Status: AC
Start: 2022-10-13 — End: 2022-10-21

## 2022-10-13 MED ORDER — DAPAGLIFLOZIN PROPANEDIOL 10 MG PO TABS
10.0000 mg | ORAL_TABLET | Freq: Every day | ORAL | 0 refills | Status: DC
Start: 2022-10-13 — End: 2022-11-09

## 2022-10-13 NOTE — Progress Notes (Signed)
Acute Office Visit  Subjective:    Patient ID: Kyle Farley, male    DOB: August 30, 1940, 82 y.o.   MRN: [id number]  Chief Complaint  Patient presents with   COPD    HPI: Patient is in today for cough, wheezing, SOB, since one week ago. He is using trellegy, and albuterol. He has nebulizer machine this morning. He did  a treatment this morning but states he didn't notice too much of a change. States that he is always SOB, but that it has not worsened. Was recently hospitalized in July for pneumonia. Admits to continue white sputum with coughing. Has had an increase in sputum production over the past week.   Past Medical History:  Diagnosis Date   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 (HCC) 11/10/2021   Acquired hypothyroidism 11/10/2021   Acute hypoxic respiratory failure (HCC) 04/25/2022   Acute on chronic diastolic heart failure (HCC) 04/25/2022   Acute on chronic systolic heart failure (HCC) 11/10/2021   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/10/2021   Alcohol use 09/12/2020   Anemia of chronic disease 04/25/2022   Arthritis    Atherosclerosis of native arteries of extremities with intermittent claudication, bilateral legs (HCC) 11/10/2021   Bilateral leg edema 02/16/2022   BPH (benign prostatic hyperplasia) 11/10/2021   CHF (congestive heart failure) (HCC)    Chronic hyponatremia 04/25/2022   Chronic respiratory failure with hypoxia (HCC) 05/09/2022   Cigarette smoker 10/21/2014   Class 1 obesity 04/25/2022   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)    Dyspnea    Dysrhythmia    atrial fibrillation   Encounter for prostate cancer screening 02/16/2022   Encounter for screening for lung cancer 02/16/2022   Essential hypertension 10/21/2014   Femur fracture, right (HCC) 09/12/2020   GERD (gastroesophageal reflux disease)    History of kidney stones    Hyperlipidemia 01/24/2017   Hypertension    Hyponatremia 04/26/2022   Kidney stones  01/24/2017   Paroxysmal atrial fibrillation (HCC) 10/21/2014   Pressure injury of skin 09/16/2020   SOB (shortness of breath) 05/10/2022   Tobacco abuse 02/16/2022    Past Surgical History:  Procedure Laterality Date   LEFT HEART CATH AND CORONARY ANGIOGRAPHY N/A 09/15/2020   Procedure: LEFT HEART CATH AND CORONARY ANGIOGRAPHY;  Surgeon: Tonny Bollman, MD;  Location: [facility] INVASIVE [facility];  Service: Cardiovascular;  Laterality: N/A;   LEG SURGERY Right    steal pin placed in right leg 35 years ago   ORIF FEMUR FRACTURE Right 09/15/2020   Procedure: OPEN REDUCTION INTERNAL FIXATION FEMORAL SHAFT FRACTURE;  Surgeon: Roby Lofts, MD;  Location: [facility] OR;  Service: Orthopedics;  Laterality: Right;    Family History  Problem Relation Age of Onset   Cancer Mother    Diabetes Mother    Breast cancer Sister     Social History   Socioeconomic History   Marital status: Widowed    Spouse name: Not on file   Number of children: 3   Years of education: Not on file   Highest education level: High school graduate  Occupational History   Occupation: retired  Tobacco Use   Smoking status: Every Day    Current packs/day: 1.00    Average packs/day: 1 pack/day for 62.0 years (62.0 ttl pk-yrs)    Types: Cigarettes, Cigars   Smokeless tobacco: Never  Vaping Use   Vaping status: Never Used  Substance and Sexual Activity   Alcohol use: Yes    Alcohol/week:  5.0 standard drinks of alcohol    Types: 5 Standard drinks or equivalent per week    Comment: 4-5 beers per day   Drug use: No   Sexual activity: Not Currently  Other Topics Concern   Not on file  Social History Narrative   Not on file   Social Determinants of Health   Financial Resource Strain: Low Risk  (04/26/2022)   Overall Financial Resource Strain (CARDIA)    Difficulty of Paying Living Expenses: Not very hard  Food Insecurity: No Food Insecurity (04/15/2022)   Hunger Vital Sign    Worried About Running Out of Food in the Last  Year: Never true    Ran Out of Food in the Last Year: Never true  Transportation Needs: No Transportation Needs (04/26/2022)   PRAPARE - Administrator, Civil Service (Medical): No    Lack of Transportation (Non-Medical): No  Physical Activity: Inactive (04/15/2022)   Exercise Vital Sign    Days of Exercise per Week: 0 days    Minutes of Exercise per Session: 0 min  Stress: No Stress Concern Present (04/15/2022)   [employer] of Occupational Health - Occupational Stress Questionnaire    Feeling of Stress : Not at all  Social Connections: Moderately Isolated (04/15/2022)   Social Connection and Isolation Panel [NHANES]    Frequency of Communication with Friends and Family: More than three times a week    Frequency of Social Gatherings with Friends and Family: More than three times a week    Attends Religious Services: More than 4 times per year    Active Member of [employer] or Organizations: No    Attends Banker Meetings: Never    Marital Status: Widowed  Intimate Partner Violence: Not At Risk (04/15/2022)   Humiliation, Afraid, Rape, and Kick questionnaire    Fear of Current or Ex-Partner: No    Emotionally Abused: No    Physically Abused: No    Sexually Abused: No    Outpatient Medications Prior to Visit  Medication Sig Dispense Refill   albuterol (VENTOLIN HFA) 108 (90 Base) MCG/ACT inhaler Inhale 2 puffs into the lungs every 4 (four) hours as needed for wheezing or shortness of breath. 48 g 0   amiodarone (PACERONE) 200 MG tablet Take 200 mg by mouth 2 (two) times daily.     amLODipine (NORVASC) 5 MG tablet Take 5 mg by mouth daily.     apixaban (ELIQUIS) 5 MG TABS tablet Take 1 tablet (5 mg total) by mouth 2 (two) times daily. 180 tablet 3   arformoterol (BROVANA) 15 MCG/2ML NEBU Take 2 mLs (15 mcg total) by nebulization 2 (two) times daily. 120 mL 11   atorvastatin (LIPITOR) 40 MG tablet Take 1 tablet (40 mg total) by mouth daily. 90 tablet 3    benzonatate (TESSALON) 100 MG capsule Take 2 capsules (200 mg total) by mouth 3 (three) times daily as needed for cough. 30 capsule 0   budesonide (PULMICORT) 0.5 MG/2ML nebulizer solution Take 2 mLs (0.5 mg total) by nebulization in the morning and at bedtime. 120 mL 11   cetirizine (ZYRTEC) 10 MG tablet Take 10 mg by mouth at bedtime.     diphenhydrAMINE HCl, Sleep, (ZZZQUIL PO) Take 1 tablet by mouth at bedtime.     EQ MUCUS RELIEF 12 HOUR MAX ST 1200 MG TB12 Take 1 tablet (1,200 mg total) by mouth 2 (two) times daily. 60 tablet 0   Iron, Ferrous Sulfate, 325 (65 Fe)  MG TABS Take 325 mg by mouth daily. 30 tablet 2   levothyroxine (SYNTHROID) 50 MCG tablet Take 50 mcg by mouth daily before breakfast.     metoprolol succinate (TOPROL-XL) 50 MG 24 hr tablet Take 12.5 mg by mouth daily. Take with or immediately following a meal.     montelukast (SINGULAIR) 10 MG tablet Take 1 tablet (10 mg total) by mouth daily at 12 noon. 90 tablet 3   nitroGLYCERIN (NITROSTAT) 0.4 MG SL tablet Place 0.4 mg under the tongue every 5 (five) minutes as needed for chest pain.     OXYGEN Inhale 3 L into the lungs as needed (shortness of breath).     pantoprazole (PROTONIX) 40 MG tablet Take 1 tablet (40 mg total) by mouth daily. 90 tablet 3   revefenacin (YUPELRI) 175 MCG/3ML nebulizer solution Take 3 mLs (175 mcg total) by nebulization daily. 90 mL 11   spironolactone (ALDACTONE) 25 MG tablet Take 1 tablet (25 mg total) by mouth daily. 90 tablet 1   tamsulosin (FLOMAX) 0.4 MG CAPS capsule Take 1 capsule (0.4 mg total) by mouth daily. 90 capsule 1   thiamine (VITAMIN B1) 100 MG tablet Take 100 mg by mouth daily.     torsemide (DEMADEX) 20 MG tablet Take 20 mg by mouth 2 (two) times daily.     empagliflozin (JARDIANCE) 10 MG TABS tablet Take 1 tablet (10 mg total) by mouth daily. (Patient not taking: Reported on 10/13/2022) 90 tablet 1   No facility-administered medications prior to visit.    No Known  Allergies  Review of Systems  Constitutional:  Negative for chills, fatigue, fever and unexpected weight change.  HENT:  Negative for congestion, ear pain, sinus pain and sore throat.   Respiratory:  Positive for cough and shortness of breath.   Cardiovascular:  Negative for chest pain and palpitations.  Gastrointestinal:  Negative for abdominal pain, blood in stool, constipation, diarrhea, nausea and vomiting.  Endocrine: Negative for polydipsia.  Genitourinary:  Negative for dysuria.  Musculoskeletal:  Negative for back pain.  Skin:  Negative for rash.  Neurological:  Negative for headaches.       Objective:        10/13/2022   10:42 AM 08/25/2022    9:40 AM 08/18/2022    1:26 PM  Vitals with BMI  Height 5' 7.5" 5' 7.5" 5' 7.5"  Weight 223 lbs 210 lbs 215 lbs 6 oz  BMI 34.39 32.39 33.22  Systolic 130 110 811  Diastolic 60 50 60  Pulse 87 72 69    No data found.   Physical Exam Vitals reviewed.  Constitutional:      Appearance: Normal appearance.  Neck:     Vascular: No carotid bruit.  Cardiovascular:     Rate and Rhythm: Normal rate and regular rhythm.     Heart sounds: Normal heart sounds.  Pulmonary:     Effort: Pulmonary effort is normal. Prolonged expiration present.     Breath sounds: Decreased air movement present. Examination of the left-middle field reveals decreased breath sounds. Examination of the left-lower field reveals decreased breath sounds. Decreased breath sounds and wheezing present.  Abdominal:     General: Bowel sounds are normal.     Palpations: Abdomen is soft.     Tenderness: There is no abdominal tenderness.  Neurological:     Mental Status: He is alert and oriented to person, place, and time.  Psychiatric:        Mood and Affect: Mood  normal.        Behavior: Behavior normal.     Health Maintenance Due  Topic Date Due   FOOT EXAM  Never done   OPHTHALMOLOGY EXAM  Never done   Zoster Vaccines- Shingrix (1 of 2) Never done    Medicare Annual Wellness (AWV)  09/05/2019   INFLUENZA VACCINE  08/19/2022    There are no preventive care reminders to display for this patient.   Lab Results  Component Value Date   TSH 3.770 05/05/2022   Lab Results  Component Value Date   WBC 7.7 08/25/2022   HGB 10.4 (L) 08/25/2022   HCT 34.7 (L) 08/25/2022   MCV 85 08/25/2022   PLT 283 08/25/2022   Lab Results  Component Value Date   NA 138 08/25/2022   K 4.4 08/25/2022   CO2 27 08/25/2022   GLUCOSE 104 (H) 08/25/2022   BUN 14 08/25/2022   CREATININE 1.17 08/25/2022   BILITOT 0.4 08/25/2022   ALKPHOS 90 08/25/2022   AST 20 08/25/2022   ALT 18 08/25/2022   PROT 6.8 08/25/2022   ALBUMIN 4.1 08/25/2022   CALCIUM 9.4 08/25/2022   ANIONGAP 11 06/13/2022   EGFR 63 08/25/2022   Lab Results  Component Value Date   CHOL 168 08/25/2022   Lab Results  Component Value Date   HDL 87 08/25/2022   Lab Results  Component Value Date   LDLCALC 67 08/25/2022   Lab Results  Component Value Date   TRIG 76 08/25/2022   Lab Results  Component Value Date   CHOLHDL 1.9 08/25/2022   Lab Results  Component Value Date   HGBA1C 6.4 (H) 08/25/2022       Assessment & Plan:  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  Dispense: 18 tablet; Refill: 0 -     DG Chest 2 View; Future  Elevated glucose Assessment & Plan: Jardiance 10mg  was $500 when he last went to the pharmacy Changed to Farxiga 10mg  to see if his insurance would cover it If not we will send a referral to medication management.   Orders: -      Dapagliflozin Propanediol; Take 1 tablet (10 mg total) by mouth daily.  Dispense: 30 tablet; Refill: 0     Meds ordered this encounter  Medications   predniSONE (DELTASONE) 20 MG tablet    Sig: Take 3 tablets (60 mg total) by mouth daily with breakfast for 3 days, THEN 2 tablets (40 mg total) daily with breakfast for 3 days, THEN 1 tablet (20 mg total) daily with breakfast for 3 days.    Dispense:  18 tablet    Refill:  0   dapagliflozin propanediol (FARXIGA) 10 MG TABS tablet    Sig: Take 1 tablet (10 mg total) by mouth daily.    Dispense:  30 tablet    Refill:  0    Orders Placed This Encounter  Procedures   DG Chest 2 View   POC COVID-19   Influenza A/B     Follow-up:  Return in 1 year (on 10/13/2023).  An After Visit Summary was printed and given to the patient.  [name], [name] [phone]

## 2022-10-13 NOTE — Assessment & Plan Note (Signed)
Prescribed Prednisone taper Sent for x-ray to rule out pneumonia Will adjust treatment depending on results

## 2022-10-13 NOTE — Assessment & Plan Note (Signed)
Jardiance 10mg  was $500 when he last went to the pharmacy Changed to Farxiga 10mg  to see if his insurance would cover it If not we will send a referral to medication management.

## 2022-10-13 NOTE — Patient Instructions (Signed)
Health Maintenance, Male Adopting a healthy lifestyle and getting preventive care are important in promoting health and wellness. Ask your health care provider about: The right schedule for you to have regular tests and exams. Things you can do on your own to prevent diseases and keep yourself healthy. What should I know about diet, weight, and exercise? Eat a healthy diet  Eat a diet that includes plenty of vegetables, fruits, low-fat dairy products, and lean protein. Do not eat a lot of foods that are high in solid fats, added sugars, or sodium. Maintain a healthy weight Body mass index (BMI) is a measurement that can be used to identify possible weight problems. It estimates body fat based on height and weight. Your health care provider can help determine your BMI and help you achieve or maintain a healthy weight. Get regular exercise Get regular exercise. This is one of the most important things you can do for your health. Most adults should: Exercise for at least 150 minutes each week. The exercise should increase your heart rate and make you sweat (moderate-intensity exercise). Do strengthening exercises at least twice a week. This is in addition to the moderate-intensity exercise. Spend less time sitting. Even light physical activity can be beneficial. Watch cholesterol and blood lipids Have your blood tested for lipids and cholesterol at 81 years of age, then have this test every 5 years. You may need to have your cholesterol levels checked more often if: Your lipid or cholesterol levels are high. You are older than 81 years of age. You are at high risk for heart disease. What should I know about cancer screening? Many types of cancers can be detected early and may often be prevented. Depending on your health history and family history, you may need to have cancer screening at various ages. This may include screening for: Colorectal cancer. Prostate cancer. Skin cancer. Lung  cancer. What should I know about heart disease, diabetes, and high blood pressure? Blood pressure and heart disease High blood pressure causes heart disease and increases the risk of stroke. This is more likely to develop in people who have high blood pressure readings or are overweight. Talk with your health care provider about your target blood pressure readings. Have your blood pressure checked: Every 3-5 years if you are 18-39 years of age. Every year if you are 40 years old or older. If you are between the ages of 65 and 75 and are a current or former smoker, ask your health care provider if you should have a one-time screening for abdominal aortic aneurysm (AAA). Diabetes Have regular diabetes screenings. This checks your fasting blood sugar level. Have the screening done: Once every three years after age 45 if you are at a normal weight and have a low risk for diabetes. More often and at a younger age if you are overweight or have a high risk for diabetes. What should I know about preventing infection? Hepatitis B If you have a higher risk for hepatitis B, you should be screened for this virus. Talk with your health care provider to find out if you are at risk for hepatitis B infection. Hepatitis C Blood testing is recommended for: Everyone born from 1945 through 1965. Anyone with known risk factors for hepatitis C. Sexually transmitted infections (STIs) You should be screened each year for STIs, including gonorrhea and chlamydia, if: You are sexually active and are younger than 81 years of age. You are older than 81 years of age and your   health care provider tells you that you are at risk for this type of infection. Your sexual activity has changed since you were last screened, and you are at increased risk for chlamydia or gonorrhea. Ask your health care provider if you are at risk. Ask your health care provider about whether you are at high risk for HIV. Your health care provider  may recommend a prescription medicine to help prevent HIV infection. If you choose to take medicine to prevent HIV, you should first get tested for HIV. You should then be tested every 3 months for as long as you are taking the medicine. Follow these instructions at home: Alcohol use Do not drink alcohol if your health care provider tells you not to drink. If you drink alcohol: Limit how much you have to 0-2 drinks a day. Know how much alcohol is in your drink. In the [place] one drink equals one 12 oz bottle of beer (355 mL), one 5 oz glass of wine (148 mL), or one 1 oz glass of hard liquor (44 mL). Lifestyle Do not use any products that contain nicotine or tobacco. These products include cigarettes, chewing tobacco, and vaping devices, such as e-cigarettes. If you need help quitting, ask your health care provider. Do not use street drugs. Do not share needles. Ask your health care provider for help if you need support or information about quitting drugs. General instructions Schedule regular health, dental, and eye exams. Stay current with your vaccines. Tell your health care provider if: You often feel depressed. You have ever been abused or do not feel safe at home. Summary Adopting a healthy lifestyle and getting preventive care are important in promoting health and wellness. Follow your health care provider's instructions about healthy diet, exercising, and getting tested or screened for diseases. Follow your health care provider's instructions on monitoring your cholesterol and blood pressure. This information is not intended to replace advice given to you by your health care provider. Make sure you discuss any questions you have with your health care provider. Document Revised: 05/26/2020 Document Reviewed: 05/26/2020 Elsevier Patient Education  2024 Elsevier Inc.  

## 2022-10-14 ENCOUNTER — Ambulatory Visit: Payer: Medicare HMO | Admitting: Physician Assistant

## 2022-10-14 ENCOUNTER — Encounter: Payer: Self-pay | Admitting: Physician Assistant

## 2022-10-14 ENCOUNTER — Telehealth: Payer: Self-pay

## 2022-10-14 VITALS — BP 110/50 | HR 69 | Temp 97.5°F | Resp 18 | Ht 67.5 in | Wt 223.0 lb

## 2022-10-14 DIAGNOSIS — Z7984 Long term (current) use of oral hypoglycemic drugs: Secondary | ICD-10-CM | POA: Diagnosis not present

## 2022-10-14 DIAGNOSIS — I714 Abdominal aortic aneurysm, without rupture, unspecified: Secondary | ICD-10-CM | POA: Diagnosis not present

## 2022-10-14 DIAGNOSIS — E119 Type 2 diabetes mellitus without complications: Secondary | ICD-10-CM | POA: Diagnosis not present

## 2022-10-14 DIAGNOSIS — J440 Chronic obstructive pulmonary disease with acute lower respiratory infection: Secondary | ICD-10-CM | POA: Diagnosis not present

## 2022-10-14 DIAGNOSIS — R600 Localized edema: Secondary | ICD-10-CM | POA: Diagnosis not present

## 2022-10-14 DIAGNOSIS — Z6831 Body mass index (BMI) 31.0-31.9, adult: Secondary | ICD-10-CM | POA: Diagnosis not present

## 2022-10-14 DIAGNOSIS — N400 Enlarged prostate without lower urinary tract symptoms: Secondary | ICD-10-CM | POA: Diagnosis not present

## 2022-10-14 DIAGNOSIS — R0602 Shortness of breath: Secondary | ICD-10-CM

## 2022-10-14 DIAGNOSIS — I959 Hypotension, unspecified: Secondary | ICD-10-CM | POA: Diagnosis not present

## 2022-10-14 DIAGNOSIS — Z9981 Dependence on supplemental oxygen: Secondary | ICD-10-CM | POA: Diagnosis not present

## 2022-10-14 DIAGNOSIS — F17210 Nicotine dependence, cigarettes, uncomplicated: Secondary | ICD-10-CM | POA: Diagnosis not present

## 2022-10-14 DIAGNOSIS — F10239 Alcohol dependence with withdrawal, unspecified: Secondary | ICD-10-CM | POA: Diagnosis not present

## 2022-10-14 DIAGNOSIS — J9621 Acute and chronic respiratory failure with hypoxia: Secondary | ICD-10-CM | POA: Diagnosis not present

## 2022-10-14 DIAGNOSIS — J188 Other pneumonia, unspecified organism: Secondary | ICD-10-CM | POA: Diagnosis not present

## 2022-10-14 DIAGNOSIS — E785 Hyperlipidemia, unspecified: Secondary | ICD-10-CM | POA: Diagnosis not present

## 2022-10-14 DIAGNOSIS — J441 Chronic obstructive pulmonary disease with (acute) exacerbation: Secondary | ICD-10-CM | POA: Diagnosis not present

## 2022-10-14 DIAGNOSIS — Z7951 Long term (current) use of inhaled steroids: Secondary | ICD-10-CM | POA: Diagnosis not present

## 2022-10-14 DIAGNOSIS — E669 Obesity, unspecified: Secondary | ICD-10-CM | POA: Diagnosis not present

## 2022-10-14 DIAGNOSIS — I5032 Chronic diastolic (congestive) heart failure: Secondary | ICD-10-CM | POA: Diagnosis not present

## 2022-10-14 DIAGNOSIS — D649 Anemia, unspecified: Secondary | ICD-10-CM | POA: Diagnosis not present

## 2022-10-14 DIAGNOSIS — Z7901 Long term (current) use of anticoagulants: Secondary | ICD-10-CM | POA: Diagnosis not present

## 2022-10-14 DIAGNOSIS — I480 Paroxysmal atrial fibrillation: Secondary | ICD-10-CM | POA: Diagnosis not present

## 2022-10-14 NOTE — Telephone Encounter (Signed)
[facility] called and stated that patient has a 2 cm by 7 cm blister filled with clear liquid on his left ankle, and he is having some drainage from his right leg. And feels he needs to be seen today  Called patient and scheduled him for 3 PM today with [name], PA-C. Patient stated he thinks it may have pop up over night his legs have been swollen but there was no blister there yesterday and his right leg had no drainage yesterday as well.

## 2022-10-14 NOTE — Progress Notes (Signed)
Subjective:  Patient ID: Kyle Farley, male    DOB: [birth year]  Age: 82 y.o. MRN: [id number]  Chief Complaint  Patient presents with   Leg Swelling   Blister    HPI   Patient is here for blister on his left ankle after physical therapy saw it. His both legs are swelling and redness. He denies pain, fever, chills. Admits to not noticing the blister till it was pointed out to him by the physical therapist. States he recently began having a weeping wound on his right leg as well. Denies any fever, chills, or recent injury to either leg.      08/05/2022   11:08 AM 05/27/2022    4:12 PM 04/02/2022   11:24 AM 04/02/2022   10:41 AM 02/16/2022    2:56 PM  Depression screen PHQ 2/9  Decreased Interest 0 0 0 0 0  Down, Depressed, Hopeless 0 0 0 0 0  PHQ - 2 Score 0 0 0 0 0  Altered sleeping 0 0     Tired, decreased energy 0 2     Change in appetite 0 2     Feeling bad or failure about yourself  0 0     Trouble concentrating 0 0     Moving slowly or fidgety/restless 0 3     Suicidal thoughts 0 0     PHQ-9 Score 0 7     Difficult doing work/chores Not difficult at all Somewhat difficult           08/30/2022   10:44 AM  Fall Risk   Falls in the past year? 1  Number falls in past yr: 0  Injury with Fall? 1  Risk for fall due to : History of fall(s);Impaired balance/gait;Impaired mobility  Follow up Falls evaluation completed;Education provided;Falls prevention discussed    Patient Care Team: Blane Ohara, MD as PCP - General (Family Medicine) Regan Lemming, MD as Consulting Physician (Cardiology) Marlowe Sax, RN as Case Manager (General Practice) Baldo Daub, MD as Consulting Physician (Cardiology)   Review of Systems  Constitutional:  Negative for fatigue.  HENT:  Negative for congestion, ear discharge and rhinorrhea.   Respiratory:  Positive for cough, chest tightness and shortness of breath.   Cardiovascular:  Positive for leg swelling. Negative for chest pain.   Gastrointestinal:  Negative for abdominal pain and nausea.  Genitourinary:  Negative for flank pain and hematuria.  Skin:  Positive for color change (redness).  Psychiatric/Behavioral:  Negative for behavioral problems.     Current Outpatient Medications on File Prior to Visit  Medication Sig Dispense Refill   albuterol (VENTOLIN HFA) 108 (90 Base) MCG/ACT inhaler Inhale 2 puffs into the lungs every 4 (four) hours as needed for wheezing or shortness of breath. 48 g 0   amiodarone (PACERONE) 200 MG tablet Take 200 mg by mouth 2 (two) times daily.     amLODipine (NORVASC) 5 MG tablet Take 5 mg by mouth daily.     apixaban (ELIQUIS) 5 MG TABS tablet Take 1 tablet (5 mg total) by mouth 2 (two) times daily. 180 tablet 3   arformoterol (BROVANA) 15 MCG/2ML NEBU Take 2 mLs (15 mcg total) by nebulization 2 (two) times daily. 120 mL 11   atorvastatin (LIPITOR) 40 MG tablet Take 1 tablet (40 mg total) by mouth daily. 90 tablet 3   benzonatate (TESSALON) 100 MG capsule Take 2 capsules (200 mg total) by mouth 3 (three) times daily as needed for  cough. 30 capsule 0   budesonide (PULMICORT) 0.5 MG/2ML nebulizer solution Take 2 mLs (0.5 mg total) by nebulization in the morning and at bedtime. 120 mL 11   cetirizine (ZYRTEC) 10 MG tablet Take 10 mg by mouth at bedtime.     dapagliflozin propanediol (FARXIGA) 10 MG TABS tablet Take 1 tablet (10 mg total) by mouth daily. 30 tablet 0   diphenhydrAMINE HCl, Sleep, (ZZZQUIL PO) Take 1 tablet by mouth at bedtime.     EQ MUCUS RELIEF 12 HOUR MAX ST 1200 MG TB12 Take 1 tablet (1,200 mg total) by mouth 2 (two) times daily. 60 tablet 0   Iron, Ferrous Sulfate, 325 (65 Fe) MG TABS Take 325 mg by mouth daily. 30 tablet 2   levothyroxine (SYNTHROID) 50 MCG tablet Take 50 mcg by mouth daily before breakfast.     metoprolol succinate (TOPROL-XL) 50 MG 24 hr tablet Take 12.5 mg by mouth daily. Take with or immediately following a meal.     montelukast (SINGULAIR) 10 MG  tablet Take 1 tablet (10 mg total) by mouth daily at 12 noon. 90 tablet 3   nitroGLYCERIN (NITROSTAT) 0.4 MG SL tablet Place 0.4 mg under the tongue every 5 (five) minutes as needed for chest pain.     OXYGEN Inhale 3 L into the lungs as needed (shortness of breath).     pantoprazole (PROTONIX) 40 MG tablet Take 1 tablet (40 mg total) by mouth daily. 90 tablet 3   predniSONE (DELTASONE) 20 MG tablet Take 3 tablets (60 mg total) by mouth daily with breakfast for 3 days, THEN 2 tablets (40 mg total) daily with breakfast for 3 days, THEN 1 tablet (20 mg total) daily with breakfast for 3 days. 18 tablet 0   revefenacin (YUPELRI) 175 MCG/3ML nebulizer solution Take 3 mLs (175 mcg total) by nebulization daily. 90 mL 11   spironolactone (ALDACTONE) 25 MG tablet Take 1 tablet (25 mg total) by mouth daily. 90 tablet 1   tamsulosin (FLOMAX) 0.4 MG CAPS capsule Take 1 capsule (0.4 mg total) by mouth daily. 90 capsule 1   thiamine (VITAMIN B1) 100 MG tablet Take 100 mg by mouth daily.     torsemide (DEMADEX) 20 MG tablet Take 20 mg by mouth 2 (two) times daily.     No current facility-administered medications on file prior to visit.   Past Medical History:  Diagnosis Date   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 (HCC) 11/10/2021   Acquired hypothyroidism 11/10/2021   Acute hypoxic respiratory failure (HCC) 04/25/2022   Acute on chronic diastolic heart failure (HCC) 04/25/2022   Acute on chronic systolic heart failure (HCC) 11/10/2021   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/10/2021   Alcohol use 09/12/2020   Anemia of chronic disease 04/25/2022   Arthritis    Atherosclerosis of native arteries of extremities with intermittent claudication, bilateral legs (HCC) 11/10/2021   Bilateral leg edema 02/16/2022   BPH (benign prostatic hyperplasia) 11/10/2021   CHF (congestive heart failure) (HCC)    Chronic hyponatremia 04/25/2022   Chronic respiratory failure with hypoxia (HCC)  05/09/2022   Cigarette smoker 10/21/2014   Class 1 obesity 04/25/2022   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)    Dyspnea    Dysrhythmia    atrial fibrillation   Encounter for prostate cancer screening 02/16/2022   Encounter for screening for lung cancer 02/16/2022   Essential hypertension 10/21/2014   Femur fracture, right (HCC) 09/12/2020   GERD (gastroesophageal reflux disease)  History of kidney stones    Hyperlipidemia 01/24/2017   Hypertension    Hyponatremia 04/26/2022   Kidney stones 01/24/2017   Paroxysmal atrial fibrillation (HCC) 10/21/2014   Pressure injury of skin 09/16/2020   SOB (shortness of breath) 05/10/2022   Tobacco abuse 02/16/2022   Past Surgical History:  Procedure Laterality Date   LEFT HEART CATH AND CORONARY ANGIOGRAPHY N/A 09/15/2020   Procedure: LEFT HEART CATH AND CORONARY ANGIOGRAPHY;  Surgeon: Tonny Bollman, MD;  Location: [facility];  Service: Cardiovascular;  Laterality: N/A;   LEG SURGERY Right    steal pin placed in right leg 35 years ago   ORIF FEMUR FRACTURE Right 09/15/2020   Procedure: OPEN REDUCTION INTERNAL FIXATION FEMORAL SHAFT FRACTURE;  Surgeon: Roby Lofts, MD;  Location: [facility] OR;  Service: Orthopedics;  Laterality: Right;    Family History  Problem Relation Age of Onset   Cancer Mother    Diabetes Mother    Breast cancer Sister    Social History   Socioeconomic History   Marital status: Widowed    Spouse name: Not on file   Number of children: 3   Years of education: Not on file   Highest education level: High school graduate  Occupational History   Occupation: retired  Tobacco Use   Smoking status: Every Day    Current packs/day: 1.00    Average packs/day: 1 pack/day for 62.0 years (62.0 ttl pk-yrs)    Types: Cigarettes, Cigars   Smokeless tobacco: Never  Vaping Use   Vaping status: Never Used  Substance and Sexual Activity   Alcohol use: Yes    Alcohol/week: 5.0 standard drinks of alcohol     Types: 5 Standard drinks or equivalent per week    Comment: 4-5 beers per day   Drug use: No   Sexual activity: Not Currently  Other Topics Concern   Not on file  Social History Narrative   Not on file   Social Determinants of Health   Financial Resource Strain: Low Risk  (04/26/2022)   Overall Financial Resource Strain (CARDIA)    Difficulty of Paying Living Expenses: Not very hard  Food Insecurity: No Food Insecurity (04/15/2022)   Hunger Vital Sign    Worried About Running Out of Food in the Last Year: Never true    Ran Out of Food in the Last Year: Never true  Transportation Needs: No Transportation Needs (04/26/2022)   PRAPARE - Administrator, Civil Service (Medical): No    Lack of Transportation (Non-Medical): No  Physical Activity: Inactive (04/15/2022)   Exercise Vital Sign    Days of Exercise per Week: 0 days    Minutes of Exercise per Session: 0 min  Stress: No Stress Concern Present (04/15/2022)   [employer] of Occupational Health - Occupational Stress Questionnaire    Feeling of Stress : Not at all  Social Connections: Moderately Isolated (04/15/2022)   Social Connection and Isolation Panel [NHANES]    Frequency of Communication with Friends and Family: More than three times a week    Frequency of Social Gatherings with Friends and Family: More than three times a week    Attends Religious Services: More than 4 times per year    Active Member of [employer] or Organizations: No    Attends Banker Meetings: Never    Marital Status: Widowed    Objective:  BP (!) 110/50   Pulse 69   Temp (!) 97.5 F (36.4 C)  Resp 18   Ht 5' 7.5" (1.715 m)   Wt 223 lb (101.2 kg)   SpO2 91%   BMI 34.41 kg/m      10/14/2022    2:52 PM 10/13/2022   10:42 AM 08/25/2022    9:40 AM  BP/Weight  Systolic BP 110 130 110  Diastolic BP 50 60 50  Wt. (Lbs) 223 223 210  BMI 34.41 kg/m2 34.41 kg/m2 32.41 kg/m2    Physical Exam Vitals reviewed.   Constitutional:      Appearance: Normal appearance.  Neck:     Vascular: No carotid bruit.  Cardiovascular:     Rate and Rhythm: Normal rate and regular rhythm.     Heart sounds: Normal heart sounds.  Pulmonary:     Effort: Pulmonary effort is normal.     Breath sounds: Normal breath sounds.  Abdominal:     General: Bowel sounds are normal.     Palpations: Abdomen is soft.     Tenderness: There is no abdominal tenderness.  Skin:    Findings: Erythema, lesion, rash and wound present. Rash is vesicular.       Neurological:     Mental Status: He is alert and oriented to person, place, and time.  Psychiatric:        Mood and Affect: Mood normal.        Behavior: Behavior normal.     Diabetic Foot Exam - Simple   No data filed      Lab Results  Component Value Date   WBC 7.7 08/25/2022   HGB 10.4 (L) 08/25/2022   HCT 34.7 (L) 08/25/2022   PLT 283 08/25/2022   GLUCOSE 104 (H) 08/25/2022   CHOL 168 08/25/2022   TRIG 76 08/25/2022   HDL 87 08/25/2022   LDLCALC 67 08/25/2022   ALT 18 08/25/2022   AST 20 08/25/2022   NA 138 08/25/2022   K 4.4 08/25/2022   CL 95 (L) 08/25/2022   CREATININE 1.17 08/25/2022   BUN 14 08/25/2022   CO2 27 08/25/2022   TSH 3.770 05/05/2022   HGBA1C 6.4 (H) 08/25/2022      Assessment & Plan:    Bilateral leg edema Assessment & Plan: Wrapped with medicated bandages Nurse visit next week to reassess legs and wounds Will wrap again if needed   SOB (shortness of breath) Assessment & Plan: Continue to use inhalers as prescribed Finish taking prednisone as prescribed Will adjust treatment as needed once x-ray is resulted.       No orders of the defined types were placed in this encounter.   No orders of the defined types were placed in this encounter.    Follow-up: No follow-ups on file.   I,Marla I Leal-Borjas,acting as a scribe for [employer], PA.,have documented all relevant documentation on the behalf of [name],  PA,as directed by  [name], PA while in the presence of [name], [place].   An After Visit Summary was printed and given to the patient.  [name], [place] [name] [phone]

## 2022-10-17 DIAGNOSIS — J449 Chronic obstructive pulmonary disease, unspecified: Secondary | ICD-10-CM | POA: Diagnosis not present

## 2022-10-18 NOTE — Assessment & Plan Note (Signed)
Continue to use inhalers as prescribed Finish taking prednisone as prescribed Will adjust treatment as needed once x-ray is resulted.

## 2022-10-18 NOTE — Assessment & Plan Note (Signed)
Wrapped with medicated bandages Nurse visit next week to reassess legs and wounds Will wrap again if needed

## 2022-10-19 DIAGNOSIS — E785 Hyperlipidemia, unspecified: Secondary | ICD-10-CM | POA: Diagnosis not present

## 2022-10-19 DIAGNOSIS — Z7984 Long term (current) use of oral hypoglycemic drugs: Secondary | ICD-10-CM | POA: Diagnosis not present

## 2022-10-19 DIAGNOSIS — J449 Chronic obstructive pulmonary disease, unspecified: Secondary | ICD-10-CM | POA: Diagnosis not present

## 2022-10-19 DIAGNOSIS — E669 Obesity, unspecified: Secondary | ICD-10-CM | POA: Diagnosis not present

## 2022-10-19 DIAGNOSIS — J441 Chronic obstructive pulmonary disease with (acute) exacerbation: Secondary | ICD-10-CM | POA: Diagnosis not present

## 2022-10-19 DIAGNOSIS — J440 Chronic obstructive pulmonary disease with acute lower respiratory infection: Secondary | ICD-10-CM | POA: Diagnosis not present

## 2022-10-19 DIAGNOSIS — J188 Other pneumonia, unspecified organism: Secondary | ICD-10-CM | POA: Diagnosis not present

## 2022-10-19 DIAGNOSIS — I959 Hypotension, unspecified: Secondary | ICD-10-CM | POA: Diagnosis not present

## 2022-10-19 DIAGNOSIS — I5032 Chronic diastolic (congestive) heart failure: Secondary | ICD-10-CM | POA: Diagnosis not present

## 2022-10-19 DIAGNOSIS — Z6831 Body mass index (BMI) 31.0-31.9, adult: Secondary | ICD-10-CM | POA: Diagnosis not present

## 2022-10-19 DIAGNOSIS — Z9981 Dependence on supplemental oxygen: Secondary | ICD-10-CM | POA: Diagnosis not present

## 2022-10-19 DIAGNOSIS — F10239 Alcohol dependence with withdrawal, unspecified: Secondary | ICD-10-CM | POA: Diagnosis not present

## 2022-10-19 DIAGNOSIS — J9621 Acute and chronic respiratory failure with hypoxia: Secondary | ICD-10-CM | POA: Diagnosis not present

## 2022-10-19 DIAGNOSIS — D649 Anemia, unspecified: Secondary | ICD-10-CM | POA: Diagnosis not present

## 2022-10-19 DIAGNOSIS — I714 Abdominal aortic aneurysm, without rupture, unspecified: Secondary | ICD-10-CM | POA: Diagnosis not present

## 2022-10-19 DIAGNOSIS — F17210 Nicotine dependence, cigarettes, uncomplicated: Secondary | ICD-10-CM | POA: Diagnosis not present

## 2022-10-19 DIAGNOSIS — N400 Enlarged prostate without lower urinary tract symptoms: Secondary | ICD-10-CM | POA: Diagnosis not present

## 2022-10-19 DIAGNOSIS — I480 Paroxysmal atrial fibrillation: Secondary | ICD-10-CM | POA: Diagnosis not present

## 2022-10-19 DIAGNOSIS — Z7951 Long term (current) use of inhaled steroids: Secondary | ICD-10-CM | POA: Diagnosis not present

## 2022-10-19 DIAGNOSIS — E119 Type 2 diabetes mellitus without complications: Secondary | ICD-10-CM | POA: Diagnosis not present

## 2022-10-19 DIAGNOSIS — Z7901 Long term (current) use of anticoagulants: Secondary | ICD-10-CM | POA: Diagnosis not present

## 2022-10-20 ENCOUNTER — Ambulatory Visit: Payer: Medicare HMO | Admitting: Physician Assistant

## 2022-10-20 ENCOUNTER — Encounter: Payer: Self-pay | Admitting: Physician Assistant

## 2022-10-20 VITALS — BP 132/68 | HR 53 | Temp 97.3°F | Ht 67.5 in | Wt 221.0 lb

## 2022-10-20 DIAGNOSIS — Z0000 Encounter for general adult medical examination without abnormal findings: Secondary | ICD-10-CM

## 2022-10-20 DIAGNOSIS — Z7951 Long term (current) use of inhaled steroids: Secondary | ICD-10-CM | POA: Diagnosis not present

## 2022-10-20 DIAGNOSIS — J449 Chronic obstructive pulmonary disease, unspecified: Secondary | ICD-10-CM | POA: Diagnosis not present

## 2022-10-20 DIAGNOSIS — Z23 Encounter for immunization: Secondary | ICD-10-CM

## 2022-10-20 MED ORDER — ALBUTEROL SULFATE HFA 108 (90 BASE) MCG/ACT IN AERS: 2.0000 | INHALATION_SPRAY | RESPIRATORY_TRACT | 3 refills | Status: DC | PRN

## 2022-10-20 NOTE — Progress Notes (Signed)
Subjective:   Kyle Farley is a 82 y.o. male who presents for Medicare Annual/Subsequent preventive examination.  Visit Complete: In person  Patient Medicare AWV questionnaire was completed by the patient on ; I have confirmed that all information answered by patient is correct and no changes since this date.  Cardiac Risk Factors include: advanced age (>46men, >13 women);diabetes mellitus;hypertension;dyslipidemia;obesity (BMI >30kg/m2);smoking/ tobacco exposure     Objective:    Today's Vitals   10/20/22 0913  Weight: 221 lb (100.2 kg)  Height: 5' 7.5" (1.715 m)   Body mass index is 34.1 kg/m.     10/20/2022    9:15 AM 04/25/2022    2:00 AM 04/24/2022   11:21 PM 09/15/2020   10:49 AM 09/12/2020   10:35 PM 09/12/2020    5:28 PM 12/27/2017    2:18 PM  Advanced Directives  Does Patient Have a Medical Advance Directive? Yes No No Yes No No Yes  Type of Estate agent of [place];Living will   Healthcare Power of [employer] [place];Living will  Does patient want to make changes to medical advance directive? No - Patient declined No - Patient declined Yes (ED - Information included in AVS) No - Patient declined   No - Patient declined  Copy of Healthcare Power of Attorney in Chart? No - copy requested   No - copy requested   No - copy requested  Would patient like information on creating a medical advance directive?  No - Patient declined   No - Patient declined      Current Medications (verified) Outpatient Encounter Medications as of 10/20/2022  Medication Sig   albuterol (VENTOLIN HFA) 108 (90 Base) MCG/ACT inhaler Inhale 2 puffs into the lungs every 4 (four) hours as needed for wheezing or shortness of breath.   amiodarone (PACERONE) 200 MG tablet Take 200 mg by mouth 2 (two) times daily.   amLODipine (NORVASC) 5 MG tablet Take 5 mg by mouth daily.   apixaban (ELIQUIS) 5 MG TABS tablet Take 1 tablet (5 mg total) by mouth 2 (two) times  daily.   arformoterol (BROVANA) 15 MCG/2ML NEBU Take 2 mLs (15 mcg total) by nebulization 2 (two) times daily.   atorvastatin (LIPITOR) 40 MG tablet Take 1 tablet (40 mg total) by mouth daily.   benzonatate (TESSALON) 100 MG capsule Take 2 capsules (200 mg total) by mouth 3 (three) times daily as needed for cough.   budesonide (PULMICORT) 0.5 MG/2ML nebulizer solution Take 2 mLs (0.5 mg total) by nebulization in the morning and at bedtime.   cetirizine (ZYRTEC) 10 MG tablet Take 10 mg by mouth at bedtime.   dapagliflozin propanediol (FARXIGA) 10 MG TABS tablet Take 1 tablet (10 mg total) by mouth daily.   diphenhydrAMINE HCl, Sleep, (ZZZQUIL PO) Take 1 tablet by mouth at bedtime.   EQ MUCUS RELIEF 12 HOUR MAX ST 1200 MG TB12 Take 1 tablet (1,200 mg total) by mouth 2 (two) times daily.   Iron, Ferrous Sulfate, 325 (65 Fe) MG TABS Take 325 mg by mouth daily.   levothyroxine (SYNTHROID) 50 MCG tablet Take 50 mcg by mouth daily before breakfast.   metoprolol succinate (TOPROL-XL) 50 MG 24 hr tablet Take 12.5 mg by mouth daily. Take with or immediately following a meal.   montelukast (SINGULAIR) 10 MG tablet Take 1 tablet (10 mg total) by mouth daily at 12 noon.   nitroGLYCERIN (NITROSTAT) 0.4 MG SL tablet Place 0.4 mg under the  tongue every 5 (five) minutes as needed for chest pain.   OXYGEN Inhale 3 L into the lungs as needed (shortness of breath).   pantoprazole (PROTONIX) 40 MG tablet Take 1 tablet (40 mg total) by mouth daily.   predniSONE (DELTASONE) 20 MG tablet Take 3 tablets (60 mg total) by mouth daily with breakfast for 3 days, THEN 2 tablets (40 mg total) daily with breakfast for 3 days, THEN 1 tablet (20 mg total) daily with breakfast for 3 days.   revefenacin (YUPELRI) 175 MCG/3ML nebulizer solution Take 3 mLs (175 mcg total) by nebulization daily.   spironolactone (ALDACTONE) 25 MG tablet Take 1 tablet (25 mg total) by mouth daily.   tamsulosin (FLOMAX) 0.4 MG CAPS capsule Take 1 capsule  (0.4 mg total) by mouth daily.   thiamine (VITAMIN B1) 100 MG tablet Take 100 mg by mouth daily.   torsemide (DEMADEX) 20 MG tablet Take 20 mg by mouth 2 (two) times daily.   No facility-administered encounter medications on file as of 10/20/2022.    Allergies (verified) Patient has no known allergies.   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/10/2021   Alcohol use 09/12/2020   Anemia of chronic disease 04/25/2022   Arthritis    Atherosclerosis of native arteries of extremities with intermittent claudication, bilateral legs (HCC) 11/10/2021   Bilateral leg edema 02/16/2022   BPH (benign prostatic hyperplasia) 11/10/2021   CHF (congestive heart failure) (HCC)    Chronic hyponatremia 04/25/2022   Chronic respiratory failure with hypoxia (HCC) 05/09/2022   Cigarette smoker 10/21/2014   Class 1 obesity 04/25/2022   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)    Dyspnea    Dysrhythmia    atrial fibrillation   Encounter for prostate cancer screening 02/16/2022   Encounter for screening for lung cancer 02/16/2022   Essential hypertension 10/21/2014   Femur fracture, right (HCC) 09/12/2020   GERD (gastroesophageal reflux disease)    History of kidney stones    Hyperlipidemia 01/24/2017   Hypertension    Hyponatremia 04/26/2022   Kidney stones 01/24/2017   Paroxysmal atrial fibrillation (HCC) 10/21/2014   Pressure injury of skin 09/16/2020   SOB (shortness of breath) 05/10/2022   Tobacco abuse 02/16/2022   Past Surgical History:  Procedure Laterality Date   LEFT HEART CATH AND CORONARY ANGIOGRAPHY N/A 09/15/2020   Procedure: LEFT HEART CATH  AND CORONARY ANGIOGRAPHY;  Surgeon: Tonny Bollman, MD;  Location: [facility] INVASIVE CV LAB;  Service: Cardiovascular;  Laterality: N/A;   LEG SURGERY Right    steal pin placed in right leg 35 years ago   ORIF FEMUR FRACTURE Right 09/15/2020   Procedure: OPEN REDUCTION INTERNAL FIXATION FEMORAL SHAFT FRACTURE;  Surgeon: Roby Lofts, MD;  Location: [facility] OR;  Service: Orthopedics;  Laterality: Right;   Family History  Problem Relation Age of Onset   Cancer Mother    Diabetes Mother    Breast cancer Sister    Social History   Socioeconomic History   Marital status: Widowed    Spouse name: Not on file   Number of children: 3   Years of education: Not on file   Highest education level: High school graduate  Occupational  History   Occupation: retired  Tobacco Use   Smoking status: Every Day    Current packs/day: 1.00    Average packs/day: 1 pack/day for 62.0 years (62.0 ttl pk-yrs)    Types: Cigarettes, Cigars   Smokeless tobacco: Never  Vaping Use   Vaping status: Never Used  Substance and Sexual Activity   Alcohol use: Yes    Alcohol/week: 5.0 standard drinks of alcohol    Types: 5 Standard drinks or equivalent per week    Comment: 4-5 beers per day   Drug use: No   Sexual activity: Not Currently  Other Topics Concern   Not on file  Social History Narrative   Not on file   Social Determinants of Health   Financial Resource Strain: Low Risk  (04/26/2022)   Overall Financial Resource Strain (CARDIA)    Difficulty of Paying Living Expenses: Not very hard  Food Insecurity: No Food Insecurity (04/15/2022)   Hunger Vital Sign    Worried About Running Out of Food in the Last Year: Never true    Ran Out of Food in the Last Year: Never true  Transportation Needs: No Transportation Needs (04/26/2022)   PRAPARE - Administrator, Civil Service (Medical): No    Lack of Transportation (Non-Medical): No  Physical Activity: Inactive (04/15/2022)   Exercise Vital Sign    Days of  Exercise per Week: 0 days    Minutes of Exercise per Session: 0 min  Stress: No Stress Concern Present (04/15/2022)   [employer] of Occupational Health - Occupational Stress Questionnaire    Feeling of Stress : Not at all  Social Connections: Moderately Isolated (04/15/2022)   Social Connection and Isolation Panel [NHANES]    Frequency of Communication with Friends and Family: More than three times a week    Frequency of Social Gatherings with Friends and Family: More than three times a week    Attends Religious Services: More than 4 times per year    Active Member of [employer] or Organizations: No    Attends Banker Meetings: Never    Marital Status: Widowed    Tobacco Counseling Ready to quit: Not Answered Counseling given: Not Answered   Clinical Intake:  Pre-visit preparation completed: No  Pain : No/denies pain     Nutritional Status: BMI > 30  Obese Nutritional Risks: None Diabetes: Yes CBG done?: No Did pt. bring in CBG monitor from home?: No  How often do you need to have someone help you when you read instructions, pamphlets, or other written materials from your doctor or pharmacy?: 1 - Never  Interpreter Needed?: No      Activities of Daily Living    10/20/2022    9:17 AM 04/25/2022    2:00 AM  In your present state of health, do you have any difficulty performing the following activities:  Hearing? 0 0  Vision? 0 0  Difficulty concentrating or making decisions? 0 0  Walking or climbing stairs? 1 1  Dressing or bathing? 0 1  Doing errands, shopping? 0 1  Preparing Food and eating ? N   Using the Toilet? N   In the past six months, have you accidently leaked urine? N   Do you have problems with loss of bowel control? N   Managing your Medications? N   Managing your Finances? N   Housekeeping or managing your Housekeeping? N     Patient Care Team: Blane Ohara, MD as PCP - General (Family  Medicine) Regan Lemming, MD as  Consulting Physician (Cardiology) Marlowe Sax, RN as Case Manager (General Practice) Baldo Daub, MD as Consulting Physician (Cardiology)  Indicate any recent Medical Services you may have received from other than [facility] providers in the past year (date may be approximate).     Assessment:   This is a routine wellness examination for Kyle Farley.  Hearing/Vision screen No results found.   Goals Addressed   None   Depression Screen    10/20/2022    9:17 AM 08/05/2022   11:08 AM 05/27/2022    4:12 PM 04/02/2022   11:24 AM 04/02/2022   10:41 AM 02/16/2022    2:56 PM 11/10/2021    2:16 PM  PHQ 2/9 Scores  PHQ - 2 Score 0 0 0 0 0 0 0  PHQ- 9 Score  0 7    0    Fall Risk    10/20/2022    9:17 AM 08/30/2022   10:44 AM 08/17/2022    2:20 PM 08/05/2022   11:08 AM 06/16/2022   10:32 AM  Fall Risk   Falls in the past year? 1 1 1 1 1   Number falls in past yr: 1 0 0  0  Injury with Fall? 0 1 1 1 1   Risk for fall due to : Impaired balance/gait;Impaired mobility History of fall(s);Impaired balance/gait;Impaired mobility History of fall(s);Impaired balance/gait;Impaired mobility History of fall(s);Impaired mobility;Impaired balance/gait History of fall(s);Other (Comment)  Risk for fall due to: Comment     Inpatient at the hospital and had a fall when it was dark in the room. Had to get stitches  Follow up Falls evaluation completed;Falls prevention discussed Falls evaluation completed;Education provided;Falls prevention discussed Falls evaluation completed;Education provided;Falls prevention discussed;Follow up appointment  Falls evaluation completed;Education provided;Falls prevention discussed;Follow up appointment    MEDICARE RISK AT HOME:    TIMED UP AND GO:  Was the test performed?  Yes  Length of time to ambulate 10 feet: 5 sec Gait steady and fast without use of assistive device    Cognitive Function:        Immunizations Immunization History  Administered Date(s)  Administered   Influenza Split 10/23/2013   Influenza, Quadrivalent, Recombinant, Inj, Pf 11/15/2018, 12/11/2019   Influenza-Unspecified 10/27/2021   PFIZER(Purple Top)SARS-COV-2 Vaccination 04/19/2019, 05/14/2019   PNEUMOCOCCAL CONJUGATE-20 11/10/2021   Tdap 06/10/2022    Screening Tests Health Maintenance  Topic Date Due   FOOT EXAM  Never done   OPHTHALMOLOGY EXAM  Never done   Zoster Vaccines- Shingrix (1 of 2) Never done   INFLUENZA VACCINE  08/19/2022   HEMOGLOBIN A1C  02/25/2023   Diabetic kidney evaluation - eGFR measurement  08/25/2023   Diabetic kidney evaluation - Urine ACR  08/25/2023   Medicare Annual Wellness (AWV)  10/20/2023   DTaP/Tdap/Td (2 - Td or Tdap) 06/09/2032   Pneumonia Vaccine 81+ Years old  Completed   HPV VACCINES  Aged Out   COVID-19 Vaccine  Discontinued    Health Maintenance  Health Maintenance Due  Topic Date Due   FOOT EXAM  Never done   OPHTHALMOLOGY EXAM  Never done   Zoster Vaccines- Shingrix (1 of 2) Never done   INFLUENZA VACCINE  08/19/2022    Colorectal cancer screening: No longer required.   Lung Cancer Screening: (Low Dose CT Chest recommended if Age 13-80 years, 20 pack-year currently smoking OR have quit w/in 15years.) does qualify.   Lung Cancer Screening Referral: Patient Declined  Additional Screening:  Hepatitis C Screening: does not qualify;   Vision Screening: Recommended annual ophthalmology exams for early detection of glaucoma and other disorders of the eye. Is the patient up to date with their annual eye exam?   No Who is the provider or what is the name of the office in which the patient attends annual eye exams? Plans to schedule appt   Dental Screening: Recommended annual dental exams for proper oral hygiene  Diabetic Foot Exam: Diabetic Foot Exam: Completed 10/14/2022  Community Resource Referral / Chronic Care Management: CRR required this visit?  No   CCM required this visit?  No     Plan:      I have personally reviewed and noted the following in the patient's chart:   Medical and social history Use of alcohol, tobacco or illicit drugs  Current medications and supplements including opioid prescriptions. Patient is not currently taking opioid prescriptions. Functional ability and status Nutritional status Physical activity Advanced directives List of other physicians Hospitalizations, surgeries, and ER visits in previous 12 months Vitals Screenings to include cognitive, depression, and falls Referrals and appointments  In addition, I have reviewed and discussed with patient certain preventive protocols, quality metrics, and best practice recommendations. A written personalized care plan for preventive services as well as general preventive health recommendations were provided to patient.

## 2022-10-21 ENCOUNTER — Other Ambulatory Visit: Payer: Self-pay | Admitting: Family Medicine

## 2022-10-21 DIAGNOSIS — J441 Chronic obstructive pulmonary disease with (acute) exacerbation: Secondary | ICD-10-CM | POA: Diagnosis not present

## 2022-10-21 DIAGNOSIS — I5032 Chronic diastolic (congestive) heart failure: Secondary | ICD-10-CM | POA: Diagnosis not present

## 2022-10-21 DIAGNOSIS — I714 Abdominal aortic aneurysm, without rupture, unspecified: Secondary | ICD-10-CM | POA: Diagnosis not present

## 2022-10-21 DIAGNOSIS — E119 Type 2 diabetes mellitus without complications: Secondary | ICD-10-CM | POA: Diagnosis not present

## 2022-10-21 DIAGNOSIS — Z7901 Long term (current) use of anticoagulants: Secondary | ICD-10-CM | POA: Diagnosis not present

## 2022-10-21 DIAGNOSIS — I959 Hypotension, unspecified: Secondary | ICD-10-CM | POA: Diagnosis not present

## 2022-10-21 DIAGNOSIS — Z7984 Long term (current) use of oral hypoglycemic drugs: Secondary | ICD-10-CM | POA: Diagnosis not present

## 2022-10-21 DIAGNOSIS — Z7951 Long term (current) use of inhaled steroids: Secondary | ICD-10-CM | POA: Diagnosis not present

## 2022-10-21 DIAGNOSIS — D649 Anemia, unspecified: Secondary | ICD-10-CM | POA: Diagnosis not present

## 2022-10-21 DIAGNOSIS — J188 Other pneumonia, unspecified organism: Secondary | ICD-10-CM | POA: Diagnosis not present

## 2022-10-21 DIAGNOSIS — F10239 Alcohol dependence with withdrawal, unspecified: Secondary | ICD-10-CM | POA: Diagnosis not present

## 2022-10-21 DIAGNOSIS — I480 Paroxysmal atrial fibrillation: Secondary | ICD-10-CM | POA: Diagnosis not present

## 2022-10-21 DIAGNOSIS — J440 Chronic obstructive pulmonary disease with acute lower respiratory infection: Secondary | ICD-10-CM | POA: Diagnosis not present

## 2022-10-21 DIAGNOSIS — E669 Obesity, unspecified: Secondary | ICD-10-CM | POA: Diagnosis not present

## 2022-10-21 DIAGNOSIS — E785 Hyperlipidemia, unspecified: Secondary | ICD-10-CM | POA: Diagnosis not present

## 2022-10-21 DIAGNOSIS — F17210 Nicotine dependence, cigarettes, uncomplicated: Secondary | ICD-10-CM | POA: Diagnosis not present

## 2022-10-21 DIAGNOSIS — Z6831 Body mass index (BMI) 31.0-31.9, adult: Secondary | ICD-10-CM | POA: Diagnosis not present

## 2022-10-21 DIAGNOSIS — Z9981 Dependence on supplemental oxygen: Secondary | ICD-10-CM | POA: Diagnosis not present

## 2022-10-21 DIAGNOSIS — J9621 Acute and chronic respiratory failure with hypoxia: Secondary | ICD-10-CM | POA: Diagnosis not present

## 2022-10-21 DIAGNOSIS — N400 Enlarged prostate without lower urinary tract symptoms: Secondary | ICD-10-CM | POA: Diagnosis not present

## 2022-10-25 ENCOUNTER — Other Ambulatory Visit: Payer: Self-pay

## 2022-10-25 ENCOUNTER — Other Ambulatory Visit: Payer: Self-pay | Admitting: Family Medicine

## 2022-10-25 ENCOUNTER — Other Ambulatory Visit: Payer: Medicare HMO

## 2022-10-25 DIAGNOSIS — Z9981 Dependence on supplemental oxygen: Secondary | ICD-10-CM | POA: Diagnosis not present

## 2022-10-25 DIAGNOSIS — F10239 Alcohol dependence with withdrawal, unspecified: Secondary | ICD-10-CM | POA: Diagnosis not present

## 2022-10-25 DIAGNOSIS — Z6831 Body mass index (BMI) 31.0-31.9, adult: Secondary | ICD-10-CM | POA: Diagnosis not present

## 2022-10-25 DIAGNOSIS — F17210 Nicotine dependence, cigarettes, uncomplicated: Secondary | ICD-10-CM | POA: Diagnosis not present

## 2022-10-25 DIAGNOSIS — J440 Chronic obstructive pulmonary disease with acute lower respiratory infection: Secondary | ICD-10-CM | POA: Diagnosis not present

## 2022-10-25 DIAGNOSIS — J441 Chronic obstructive pulmonary disease with (acute) exacerbation: Secondary | ICD-10-CM | POA: Diagnosis not present

## 2022-10-25 DIAGNOSIS — D649 Anemia, unspecified: Secondary | ICD-10-CM | POA: Diagnosis not present

## 2022-10-25 DIAGNOSIS — J188 Other pneumonia, unspecified organism: Secondary | ICD-10-CM | POA: Diagnosis not present

## 2022-10-25 DIAGNOSIS — Z7901 Long term (current) use of anticoagulants: Secondary | ICD-10-CM | POA: Diagnosis not present

## 2022-10-25 DIAGNOSIS — E785 Hyperlipidemia, unspecified: Secondary | ICD-10-CM | POA: Diagnosis not present

## 2022-10-25 DIAGNOSIS — E669 Obesity, unspecified: Secondary | ICD-10-CM | POA: Diagnosis not present

## 2022-10-25 DIAGNOSIS — I959 Hypotension, unspecified: Secondary | ICD-10-CM | POA: Diagnosis not present

## 2022-10-25 DIAGNOSIS — I714 Abdominal aortic aneurysm, without rupture, unspecified: Secondary | ICD-10-CM | POA: Diagnosis not present

## 2022-10-25 DIAGNOSIS — I5032 Chronic diastolic (congestive) heart failure: Secondary | ICD-10-CM | POA: Diagnosis not present

## 2022-10-25 DIAGNOSIS — Z7951 Long term (current) use of inhaled steroids: Secondary | ICD-10-CM | POA: Diagnosis not present

## 2022-10-25 DIAGNOSIS — J9621 Acute and chronic respiratory failure with hypoxia: Secondary | ICD-10-CM | POA: Diagnosis not present

## 2022-10-25 DIAGNOSIS — N400 Enlarged prostate without lower urinary tract symptoms: Secondary | ICD-10-CM | POA: Diagnosis not present

## 2022-10-25 DIAGNOSIS — E119 Type 2 diabetes mellitus without complications: Secondary | ICD-10-CM | POA: Diagnosis not present

## 2022-10-25 DIAGNOSIS — I480 Paroxysmal atrial fibrillation: Secondary | ICD-10-CM | POA: Diagnosis not present

## 2022-10-25 DIAGNOSIS — Z7984 Long term (current) use of oral hypoglycemic drugs: Secondary | ICD-10-CM | POA: Diagnosis not present

## 2022-10-25 MED ORDER — NITROGLYCERIN 0.4 MG SL SUBL: 0.4000 mg | SUBLINGUAL_TABLET | SUBLINGUAL | 1 refills | Status: DC | PRN

## 2022-10-25 NOTE — Patient Outreach (Signed)
Care Management   Visit Note  10/25/2022 Name: Kyle Farley MRN: [id number] DOB: 08-24-1940  Subjective: Kyle Farley is a 82 y.o. year old male who is a primary care patient of [name], Kirsten, MD. The Care Management team was consulted for assistance.      Engaged with patient spoke with patient by telephone.    Goals Addressed             This Visit's Progress    RNCM Care Management  Expected Outcome:  Monitor, Self-Manage and Reduce Symptoms of Afib       Current Barriers:  Chronic Disease Management support and education needs related to effective management of AFIB  Planned Interventions: Provider order and care plan reviewed. Collaborated with PharmD regarding patient care and plan. Works with pharm D on a regular basis. Saw pcp recently. No changes in the current plan of care. Haw had some swelling and edema but that is better now. The patient denies any acute changes in his AFIB or heart health.  Counseled on increased risk of stroke due to Afib and benefits of anticoagulation for stroke prevention           Reviewed importance of adherence to anticoagulant exactly as prescribed. Is compliant with medications. No recent changes in medications. Did reach out to the pcp and ask for a refill for NTG. Will continue to monitor.  Advised patient to discuss changes in AFIB, questions or concerns with provider Counseled on bleeding risk associated with AFIB and importance of self-monitoring for signs/symptoms of bleeding Counseled on avoidance of NSAIDs due to increased bleeding risk with anticoagulants Counseled on importance of regular laboratory monitoring as prescribed. Has labs on a regular basis.  Counseled on seeking medical attention after a head injury or if there is blood in the urine/stool. Review on being safe and monitoring for acute changes Afib action plan reviewed Screening for signs and symptoms of depression related to chronic disease state Assessed social  determinant of health barriers  Symptom Management: Take medications as prescribed   Attend all scheduled provider appointments Call provider office for new concerns or questions  call the Suicide and Crisis Lifeline: 988 call the [place] National Suicide Prevention Lifeline: 319-400-0321 or TTY: 319-216-2673 TTY 9796213226) to talk to a trained counselor call 1-800-273-TALK (toll free, 24 hour hotline) go to [facility] [address], [place] [phone]) if experiencing a Mental Health or Behavioral Health Crisis  - make a plan to eat healthy - keep all lab appointments - take medicine as prescribed  Follow Up Plan: Telephone follow up appointment with care management team member scheduled for: 12-20-2022 at 1030 am       RNCM Care Management  Expected Outcome:  Monitor, Self-Manage, and Reduce Symptoms of Hypertension       Current Barriers:  Knowledge Deficits related to the importance of calling the providers when out of medications or changes in HTN or heart health Care Coordination needs related to pharmacy support and education, referral pending in a patient with HTN and other chronic conditions Chronic Disease Management support and education needs related to effective management of HTN BP Readings from Last 3 Encounters:  10/20/22 132/68  10/14/22 (!) 110/50  10/13/22 130/60     Planned Interventions: Evaluation of current treatment plan related to hypertension self management and patient's adherence to plan as established by provider. The patient is having better blood pressures. Is having some swelling in his feet but the swelling  is better. He has had wraps on his legs but they were taken off last week. Education provided. Review of sx and sx of infection and what to look for if he has worse edema in his bilateral legs. Will continue to monitor for changes.  Provided education to patient re: stroke prevention, s/s of heart attack  and stroke; Reviewed prescribed diet heart healthy diet. The patient states that his sodium is low and they tell him it is okay to eat salt. Does have issues with swelling and edema in his feet and legs. The patient states he and his son eat a lot of fast foods. Education on the benefits of eating fresh fruits and vegetables. Discussed cutting back on drinking beer.  Denies any acute changes in his dietary habits.  Reviewed medications with patient and discussed importance of compliance. States he has not been taking his fluid pill. Encouraged him to talk with the provider about when to take his fluid pill. States he is doing well. Denies any acute changes in his medications. Saw pcp last week.  Discussed plans with patient for ongoing care management follow up and provided patient with direct contact information for care management team; Advised patient, providing education and rationale, to monitor blood pressure daily and record, calling PCP for findings outside established parameters;  Reviewed scheduled/upcoming provider appointments including: 12-15-2022 at 0940 am Advised patient to discuss changes in his HTN or  heart health with provider; Provided education on prescribed diet heart healthy diet;  Discussed complications of poorly controlled blood pressure such as heart disease, stroke, circulatory complications, vision complications, kidney impairment, sexual dysfunction;  Screening for signs and symptoms of depression related to chronic disease state;  Assessed social determinant of health barriers;  He is working with home health for 6 weeks. Has PT and a nurse coming out one time a week.   Symptom Management: Take medications as prescribed   Attend all scheduled provider appointments Call provider office for new concerns or questions  call the Suicide and Crisis Lifeline: 988 call the [place] National Suicide Prevention Lifeline: (626)722-9428 or TTY: 210 267 3306 TTY 909-053-7890) to  talk to a trained counselor call 1-800-273-TALK (toll free, 24 hour hotline) go to [facility] [address] [place] [phone]) if experiencing a Mental Health or Behavioral Health Crisis  check blood pressure weekly learn about high blood pressure call doctor for signs and symptoms of high blood pressure develop an action plan for high blood pressure keep all doctor appointments take medications for blood pressure exactly as prescribed report new symptoms to your doctor  Follow Up Plan: Telephone follow up appointment with care management team member scheduled for: 12-20-2022 at 1030 am       RNCM Care Management Expected Outcome:  Monitor, Self-Manage and Reduce Symptoms of Heart Failure       Current Barriers:  Knowledge Deficits related to the benefits of weighing daily to monitor for excess fluid and to prevent an exacerbation of HF Care Coordination needs related to education and support from pharm D for medication management and support  in a patient with HF Chronic Disease Management support and education needs related to effective management of HF Wt Readings from Last 3 Encounters:  10/20/22 221 lb (100.2 kg)  10/14/22 223 lb (101.2 kg)  10/13/22 223 lb (101.2 kg)     Planned Interventions: Basic overview and discussion of pathophysiology of Heart Failure reviewed. The patients weight is up but staying in the 220 to  223 range. The patient states he has some swelling in his legs but they have taken the wraps off and it looks better. The patient knows what worse looks like. Denies any acute changes.  Provided education on low sodium diet. The patient states he is mindful of sodium but his sodium levels have been low. Review of heart healthy diet and will send information in the mail to the patient Reviewed Heart Failure Action Plan in depth and provided written copy Assessed need for readable accurate scales in home. The patient is  weighing daily and denies any acute changes  Discussed importance of daily weight and advised patient to weigh and record daily. The patient states he can tell if he has more water weight on board by how hard it is to get his shoes on. He no longer wears socks. Education on possibly monitoring his weight daily as this is an indicator of exacerbation of heart failure and may be one of the reason his is having more shortness of breath recently. Review of calling the provider for +2 or +3 pounds in one day and + 5 pounds in one week. Reminder provided today. Reviewed role of diuretics in prevention of fluid overload and management of heart failure. The patient states he has not been taking his Torsemide. Education on talking to his cardiologist about parameters to take his fluid pill.   Discussed the importance of keeping all appointments with provider Advised patient to discuss changes in fluid balance, changes in HF, questions, or concerns with provider Screening for signs and symptoms of depression related to chronic disease state  Assessed social determinant of health barriers EF 50 to 55% on 06-10-2021, education provided on what the EF% was and how it impacts his chronic conditions  Symptom Management: Take medications as prescribed   Attend all scheduled provider appointments Call provider office for new concerns or questions  call the Suicide and Crisis Lifeline: 988 call the [place] National Suicide Prevention Lifeline: 463 604 1817 or TTY: (651)228-1653 TTY 727 516 3399) to talk to a trained counselor call 1-800-273-TALK (toll free, 24 hour hotline) go to [facility] [address], [place] [phone]) if experiencing a Mental Health or Behavioral Health Crisis  call office if I gain more than 2 pounds in one day or 5 pounds in one week use salt in moderation watch for swelling in feet, ankles and legs every day weigh myself daily develop a rescue  plan follow rescue plan if symptoms flare-up  Follow Up Plan: Telephone follow up appointment with care management team member scheduled for: 12-20-2022 at 1030 am       RNCM Care Management:  Maintain, Monitor and Self-Manage Symptoms of COPD       Current Barriers:  Knowledge Deficits related to the importance of following the plan of care and taking medications as directed for effective management of COPD Care Coordination needs related to pharmacy needs  in a patient with COPD Chronic Disease Management support and education needs related to effective management of COPD 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 The patient is having shortness of breath and is using his oxygen at night. The patient states that his breathing is better, was a little short of breath at the time of  the call but that was because he was returning to inside the house to get his truck keys. Education provided. The patient asking for a refill on his trelegy. Advised patient to track and manage COPD triggers. Review of monitoring for changes that may exacerbate his COPD. The patient is aware and knows to watch for acute changes.  Provided written and verbal instructions on pursed lip breathing and utilized returned demonstration as teach back Provided instruction about proper use of medications used for management of COPD including inhalers. The patient ask for help with getting a refill for his trelegy. The patient states that he can afford it. In basket message sent to the pcp asking for help with refill for inhaler. Also included the pharm D. Will continue to monitor.  Advised patient to self assesses COPD action plan zone and make appointment with provider if in the  yellow zone for 48 hours without improvement Advised patient to engage in light exercise as tolerated 3-5 days a week to aid in the the management of COPD Provided education about and advised patient to utilize infection prevention strategies to reduce risk of respiratory infection Discussed the importance of adequate rest and management of fatigue with COPD Screening for signs and symptoms of depression related to chronic disease state  Assessed social determinant of health barriers  Symptom Management: Take medications as prescribed   Attend all scheduled provider appointments Call provider office for new concerns or questions  call the Suicide and Crisis Lifeline: 988 call the [place] National Suicide Prevention Lifeline: 513-708-8890 or TTY: (807)844-7568 TTY [phone]) to talk to a trained counselor call 1-800-273-TALK (toll free, 24 hour hotline) go to [facility] [address], [place] [phone]) if experiencing a Mental Health or Behavioral Health Crisis  identify and remove indoor air pollutants limit outdoor activity during cold weather listen for public air quality announcements every day develop a rescue plan eliminate symptom triggers at home follow rescue plan if symptoms flare-up  Follow Up Plan: Telephone follow up appointment with care management team member scheduled for: 12-20-2022 at 1030 am           Consent to Services:  Patient was given information about care management services, agreed to services, and gave verbal consent to participate.   Plan: Telephone follow up appointment with care management team member scheduled for: 12-20-2022 at 1030 am  Alto Denver RN, MSN, CCM RN Care Manager  [facility]  Ambulatory Care Management  Direct Number: [phone]

## 2022-10-25 NOTE — Patient Instructions (Signed)
Visit Information  Thank you for taking time to visit with me today. Please don't hesitate to contact me if I can be of assistance to you before our next scheduled telephone appointment.  Following are the goals we discussed today:   Goals Addressed             This Visit's Progress    RNCM Care Management  Expected Outcome:  Monitor, Self-Manage and Reduce Symptoms of Afib       Current Barriers:  Chronic Disease Management support and education needs related to effective management of AFIB  Planned Interventions: Provider order and care plan reviewed. Collaborated with PharmD regarding patient care and plan. Works with pharm D on a regular basis. Saw pcp recently. No changes in the current plan of care. Haw had some swelling and edema but that is better now. The patient denies any acute changes in his AFIB or heart health.  Counseled on increased risk of stroke due to Afib and benefits of anticoagulation for stroke prevention           Reviewed importance of adherence to anticoagulant exactly as prescribed. Is compliant with medications. No recent changes in medications. Did reach out to the pcp and ask for a refill for NTG. Will continue to monitor.  Advised patient to discuss changes in AFIB, questions or concerns with provider Counseled on bleeding risk associated with AFIB and importance of self-monitoring for signs/symptoms of bleeding Counseled on avoidance of NSAIDs due to increased bleeding risk with anticoagulants Counseled on importance of regular laboratory monitoring as prescribed. Has labs on a regular basis.  Counseled on seeking medical attention after a head injury or if there is blood in the urine/stool. Review on being safe and monitoring for acute changes Afib action plan reviewed Screening for signs and symptoms of depression related to chronic disease state Assessed social determinant of health barriers  Symptom Management: Take medications as prescribed   Attend  all scheduled provider appointments Call provider office for new concerns or questions  call the Suicide and Crisis Lifeline: 988 call the [place] National Suicide Prevention Lifeline: (726) 828-0307 or TTY: 253-795-0454 TTY 254-018-6791) to talk to a trained counselor call 1-800-273-TALK (toll free, 24 hour hotline) go to [facility] [address] [place] [phone]) if experiencing a Mental Health or Behavioral Health Crisis  - make a plan to eat healthy - keep all lab appointments - take medicine as prescribed  Follow Up Plan: Telephone follow up appointment with care management team member scheduled for: 12-20-2022 at 1030 am       RNCM Care Management  Expected Outcome:  Monitor, Self-Manage, and Reduce Symptoms of Hypertension       Current Barriers:  Knowledge Deficits related to the importance of calling the providers when out of medications or changes in HTN or heart health Care Coordination needs related to pharmacy support and education, referral pending in a patient with HTN and other chronic conditions Chronic Disease Management support and education needs related to effective management of HTN BP Readings from Last 3 Encounters:  10/20/22 132/68  10/14/22 (!) 110/50  10/13/22 130/60     Planned Interventions: Evaluation of current treatment plan related to hypertension self management and patient's adherence to plan as established by provider. The patient is having better blood pressures. Is having some swelling in his feet but the swelling is better. He has had wraps on his legs but they were taken off last week. Education provided. Review  of sx and sx of infection and what to look for if he has worse edema in his bilateral legs. Will continue to monitor for changes.  Provided education to patient re: stroke prevention, s/s of heart attack and stroke; Reviewed prescribed diet heart healthy diet. The patient states that his sodium is  low and they tell him it is okay to eat salt. Does have issues with swelling and edema in his feet and legs. The patient states he and his son eat a lot of fast foods. Education on the benefits of eating fresh fruits and vegetables. Discussed cutting back on drinking beer.  Denies any acute changes in his dietary habits.  Reviewed medications with patient and discussed importance of compliance. States he has not been taking his fluid pill. Encouraged him to talk with the provider about when to take his fluid pill. States he is doing well. Denies any acute changes in his medications. Saw pcp last week.  Discussed plans with patient for ongoing care management follow up and provided patient with direct contact information for care management team; Advised patient, providing education and rationale, to monitor blood pressure daily and record, calling PCP for findings outside established parameters;  Reviewed scheduled/upcoming provider appointments including: 12-15-2022 at 0940 am Advised patient to discuss changes in his HTN or  heart health with provider; Provided education on prescribed diet heart healthy diet;  Discussed complications of poorly controlled blood pressure such as heart disease, stroke, circulatory complications, vision complications, kidney impairment, sexual dysfunction;  Screening for signs and symptoms of depression related to chronic disease state;  Assessed social determinant of health barriers;  He is working with home health for 6 weeks. Has PT and a nurse coming out one time a week.   Symptom Management: Take medications as prescribed   Attend all scheduled provider appointments Call provider office for new concerns or questions  call the Suicide and Crisis Lifeline: 988 call the [place] National Suicide Prevention Lifeline: 908-501-8884 or TTY: 706-838-7260 TTY 216 336 0216) to talk to a trained counselor call 1-800-273-TALK (toll free, 24 hour hotline) go to [facility] [address], [place] [phone]) if experiencing a Mental Health or Behavioral Health Crisis  check blood pressure weekly learn about high blood pressure call doctor for signs and symptoms of high blood pressure develop an action plan for high blood pressure keep all doctor appointments take medications for blood pressure exactly as prescribed report new symptoms to your doctor  Follow Up Plan: Telephone follow up appointment with care management team member scheduled for: 12-20-2022 at 1030 am       RNCM Care Management Expected Outcome:  Monitor, Self-Manage and Reduce Symptoms of Heart Failure       Current Barriers:  Knowledge Deficits related to the benefits of weighing daily to monitor for excess fluid and to prevent an exacerbation of HF Care Coordination needs related to education and support from pharm D for medication management and support  in a patient with HF Chronic Disease Management support and education needs related to effective management of HF Wt Readings from Last 3 Encounters:  10/20/22 221 lb (100.2 kg)  10/14/22 223 lb (101.2 kg)  10/13/22 223 lb (101.2 kg)     Planned Interventions: Basic overview and discussion of pathophysiology of Heart Failure reviewed. The patients weight is up but staying in the 220 to 223 range. The patient states he has some swelling in his legs but they have taken the wraps off  and it looks better. The patient knows what worse looks like. Denies any acute changes.  Provided education on low sodium diet. The patient states he is mindful of sodium but his sodium levels have been low. Review of heart healthy diet and will send information in the mail to the patient Reviewed Heart Failure Action Plan in depth and provided written copy Assessed need for readable accurate scales in home. The patient is weighing daily and denies any acute changes  Discussed importance of daily weight and advised  patient to weigh and record daily. The patient states he can tell if he has more water weight on board by how hard it is to get his shoes on. He no longer wears socks. Education on possibly monitoring his weight daily as this is an indicator of exacerbation of heart failure and may be one of the reason his is having more shortness of breath recently. Review of calling the provider for +2 or +3 pounds in one day and + 5 pounds in one week. Reminder provided today. Reviewed role of diuretics in prevention of fluid overload and management of heart failure. The patient states he has not been taking his Torsemide. Education on talking to his cardiologist about parameters to take his fluid pill.   Discussed the importance of keeping all appointments with provider Advised patient to discuss changes in fluid balance, changes in HF, questions, or concerns with provider Screening for signs and symptoms of depression related to chronic disease state  Assessed social determinant of health barriers EF 50 to 55% on 06-10-2021, education provided on what the EF% was and how it impacts his chronic conditions  Symptom Management: Take medications as prescribed   Attend all scheduled provider appointments Call provider office for new concerns or questions  call the Suicide and Crisis Lifeline: 988 call the [place] National Suicide Prevention Lifeline: 312-237-5339 or TTY: 618-405-0605 TTY 806-752-3206) to talk to a trained counselor call 1-800-273-TALK (toll free, 24 hour hotline) go to [facility] [address], [place] [phone]) if experiencing a Mental Health or Behavioral Health Crisis  call office if I gain more than 2 pounds in one day or 5 pounds in one week use salt in moderation watch for swelling in feet, ankles and legs every day weigh myself daily develop a rescue plan follow rescue plan if symptoms flare-up  Follow Up Plan: Telephone follow up appointment  with care management team member scheduled for: 12-20-2022 at 1030 am       RNCM Care Management:  Maintain, Monitor and Self-Manage Symptoms of COPD       Current Barriers:  Knowledge Deficits related to the importance of following the plan of care and taking medications as directed for effective management of COPD Care Coordination needs related to pharmacy needs  in a patient with COPD Chronic Disease Management support and education needs related to effective management of COPD 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 The patient is having shortness of breath and is using his oxygen at night. The patient states that his breathing is better, was a little short of breath at the time of the call but that was because he was returning to inside the house to get his truck keys. Education  provided. The patient asking for a refill on his trelegy. Advised patient to track and manage COPD triggers. Review of monitoring for changes that may exacerbate his COPD. The patient is aware and knows to watch for acute changes.  Provided written and verbal instructions on pursed lip breathing and utilized returned demonstration as teach back Provided instruction about proper use of medications used for management of COPD including inhalers. The patient ask for help with getting a refill for his trelegy. The patient states that he can afford it. In basket message sent to the pcp asking for help with refill for inhaler. Also included the pharm D. Will continue to monitor.  Advised patient to self assesses COPD action plan zone and make appointment with provider if in the yellow zone for 48 hours without improvement Advised patient to engage in light exercise as tolerated  3-5 days a week to aid in the the management of COPD Provided education about and advised patient to utilize infection prevention strategies to reduce risk of respiratory infection Discussed the importance of adequate rest and management of fatigue with COPD Screening for signs and symptoms of depression related to chronic disease state  Assessed social determinant of health barriers  Symptom Management: Take medications as prescribed   Attend all scheduled provider appointments Call provider office for new concerns or questions  call the Suicide and Crisis Lifeline: 988 call the [place] National Suicide Prevention Lifeline: 469-034-0904 or TTY: 548-331-3688 TTY 610-318-4527) to talk to a trained counselor call 1-800-273-TALK (toll free, 24 hour hotline) go to [facility] [address] [place] [phone]) if experiencing a Mental Health or Behavioral Health Crisis  identify and remove indoor air pollutants limit outdoor activity during cold weather listen for public air quality announcements every day develop a rescue plan eliminate symptom triggers at home follow rescue plan if symptoms flare-up  Follow Up Plan: Telephone follow up appointment with care management team member scheduled for: 12-20-2022 at 1030 am           Our next appointment is by telephone on 12-20-2022 at 1030 am  Please call the care guide team at [phone] if you need to cancel or reschedule your appointment.   If you are experiencing a Mental Health or Behavioral Health Crisis or need someone to talk to, please call the Suicide and Crisis Lifeline: 988 call the [place] National Suicide Prevention Lifeline: (414)239-3065 or TTY: 239 771 1823 TTY 715-595-7214) to talk to a trained counselor call 1-800-273-TALK (toll free, 24 hour hotline) go to [facility] [address], [place] [phone])   The patient verbalized  understanding of instructions, educational materials, and care plan provided today and DECLINED offer to receive copy of patient instructions, educational materials, and care plan.   Telephone follow up appointment with care management team member scheduled for: 12-20-2022 at 1030 am  Alto Denver RN, MSN, CCM RN Care Manager  [facility]  Ambulatory Care Management  Direct Number: [phone]

## 2022-10-26 ENCOUNTER — Other Ambulatory Visit: Payer: Medicare HMO | Admitting: Pharmacist

## 2022-10-26 NOTE — Progress Notes (Signed)
10/26/2022 Name: Kyle Farley MRN: [id number] DOB: 04/18/1940  Chief Complaint  Patient presents with   Medication Assistance    Care Coordination Call  S/O: Outreached patient in response to [facility] inbasket message for assistance with medication access - inhaler.  Patient reports pulmonologist changed his inhaler to new nebulizers and he cannot afford. It was switched from trelegy due to questionable efficacy related to difficulty taking deep breath to inhale medication into lungs. He states he can afford the trelegy better ($160/month) and wishes to go back on it.  A/P: - Screened patient for eligibility of cost reduction programs, he actually qualifies for Medicare LIS/Extra Help. Completed application, reentry ID: [id number]  - Recommended patient contact pulmonologist for changing prescriptions back to trelegy, provided phone number for pulmonologist office. - If trelegy is not effective, recommend consider Breztri in future, though in the past patient states insurance did not cover/affordability barrier. Therefore, recommend trelegy for now (as patient states he can afford for now), and then upon hearing result of medicare Extra help application, may switch to breztri. If denied, can submit for [place] patient assistance for breztri.  Follow-up: 2-3 weeks to assess outcome of medicare LIS/extra help  Lynnda Shields, PharmD, BCPS Clinical Pharmacist [facility]

## 2022-10-27 ENCOUNTER — Telehealth: Payer: Self-pay

## 2022-10-27 DIAGNOSIS — F17210 Nicotine dependence, cigarettes, uncomplicated: Secondary | ICD-10-CM | POA: Diagnosis not present

## 2022-10-27 DIAGNOSIS — Z7984 Long term (current) use of oral hypoglycemic drugs: Secondary | ICD-10-CM | POA: Diagnosis not present

## 2022-10-27 DIAGNOSIS — E119 Type 2 diabetes mellitus without complications: Secondary | ICD-10-CM | POA: Diagnosis not present

## 2022-10-27 DIAGNOSIS — E669 Obesity, unspecified: Secondary | ICD-10-CM | POA: Diagnosis not present

## 2022-10-27 DIAGNOSIS — E785 Hyperlipidemia, unspecified: Secondary | ICD-10-CM | POA: Diagnosis not present

## 2022-10-27 DIAGNOSIS — I959 Hypotension, unspecified: Secondary | ICD-10-CM | POA: Diagnosis not present

## 2022-10-27 DIAGNOSIS — I5032 Chronic diastolic (congestive) heart failure: Secondary | ICD-10-CM | POA: Diagnosis not present

## 2022-10-27 DIAGNOSIS — I480 Paroxysmal atrial fibrillation: Secondary | ICD-10-CM | POA: Diagnosis not present

## 2022-10-27 DIAGNOSIS — Z7951 Long term (current) use of inhaled steroids: Secondary | ICD-10-CM | POA: Diagnosis not present

## 2022-10-27 DIAGNOSIS — Z7901 Long term (current) use of anticoagulants: Secondary | ICD-10-CM | POA: Diagnosis not present

## 2022-10-27 DIAGNOSIS — J9621 Acute and chronic respiratory failure with hypoxia: Secondary | ICD-10-CM | POA: Diagnosis not present

## 2022-10-27 DIAGNOSIS — J188 Other pneumonia, unspecified organism: Secondary | ICD-10-CM | POA: Diagnosis not present

## 2022-10-27 DIAGNOSIS — J440 Chronic obstructive pulmonary disease with acute lower respiratory infection: Secondary | ICD-10-CM | POA: Diagnosis not present

## 2022-10-27 DIAGNOSIS — I714 Abdominal aortic aneurysm, without rupture, unspecified: Secondary | ICD-10-CM | POA: Diagnosis not present

## 2022-10-27 DIAGNOSIS — Z6831 Body mass index (BMI) 31.0-31.9, adult: Secondary | ICD-10-CM | POA: Diagnosis not present

## 2022-10-27 DIAGNOSIS — J441 Chronic obstructive pulmonary disease with (acute) exacerbation: Secondary | ICD-10-CM | POA: Diagnosis not present

## 2022-10-27 DIAGNOSIS — Z9981 Dependence on supplemental oxygen: Secondary | ICD-10-CM | POA: Diagnosis not present

## 2022-10-27 DIAGNOSIS — N400 Enlarged prostate without lower urinary tract symptoms: Secondary | ICD-10-CM | POA: Diagnosis not present

## 2022-10-27 DIAGNOSIS — F10239 Alcohol dependence with withdrawal, unspecified: Secondary | ICD-10-CM | POA: Diagnosis not present

## 2022-10-27 DIAGNOSIS — D649 Anemia, unspecified: Secondary | ICD-10-CM | POA: Diagnosis not present

## 2022-10-27 NOTE — Telephone Encounter (Signed)
[facility] CERTIFICATION AND POC: ORDER # [id number]

## 2022-10-28 ENCOUNTER — Encounter: Payer: Self-pay | Admitting: Physician Assistant

## 2022-11-01 ENCOUNTER — Other Ambulatory Visit: Payer: Self-pay

## 2022-11-01 DIAGNOSIS — J440 Chronic obstructive pulmonary disease with acute lower respiratory infection: Secondary | ICD-10-CM | POA: Diagnosis not present

## 2022-11-01 DIAGNOSIS — E119 Type 2 diabetes mellitus without complications: Secondary | ICD-10-CM | POA: Diagnosis not present

## 2022-11-01 DIAGNOSIS — I480 Paroxysmal atrial fibrillation: Secondary | ICD-10-CM | POA: Diagnosis not present

## 2022-11-01 DIAGNOSIS — E785 Hyperlipidemia, unspecified: Secondary | ICD-10-CM | POA: Diagnosis not present

## 2022-11-01 DIAGNOSIS — N400 Enlarged prostate without lower urinary tract symptoms: Secondary | ICD-10-CM | POA: Diagnosis not present

## 2022-11-01 DIAGNOSIS — F10239 Alcohol dependence with withdrawal, unspecified: Secondary | ICD-10-CM | POA: Diagnosis not present

## 2022-11-01 DIAGNOSIS — I959 Hypotension, unspecified: Secondary | ICD-10-CM | POA: Diagnosis not present

## 2022-11-01 DIAGNOSIS — J188 Other pneumonia, unspecified organism: Secondary | ICD-10-CM | POA: Diagnosis not present

## 2022-11-01 DIAGNOSIS — J9621 Acute and chronic respiratory failure with hypoxia: Secondary | ICD-10-CM | POA: Diagnosis not present

## 2022-11-01 DIAGNOSIS — I714 Abdominal aortic aneurysm, without rupture, unspecified: Secondary | ICD-10-CM | POA: Diagnosis not present

## 2022-11-01 DIAGNOSIS — J449 Chronic obstructive pulmonary disease, unspecified: Secondary | ICD-10-CM

## 2022-11-01 DIAGNOSIS — I5032 Chronic diastolic (congestive) heart failure: Secondary | ICD-10-CM | POA: Diagnosis not present

## 2022-11-01 DIAGNOSIS — J441 Chronic obstructive pulmonary disease with (acute) exacerbation: Secondary | ICD-10-CM | POA: Diagnosis not present

## 2022-11-01 MED ORDER — ALBUTEROL SULFATE HFA 108 (90 BASE) MCG/ACT IN AERS: 2.0000 | INHALATION_SPRAY | RESPIRATORY_TRACT | 3 refills | Status: DC | PRN

## 2022-11-03 DIAGNOSIS — J188 Other pneumonia, unspecified organism: Secondary | ICD-10-CM | POA: Diagnosis not present

## 2022-11-03 DIAGNOSIS — E785 Hyperlipidemia, unspecified: Secondary | ICD-10-CM | POA: Diagnosis not present

## 2022-11-03 DIAGNOSIS — E669 Obesity, unspecified: Secondary | ICD-10-CM | POA: Diagnosis not present

## 2022-11-03 DIAGNOSIS — D649 Anemia, unspecified: Secondary | ICD-10-CM | POA: Diagnosis not present

## 2022-11-03 DIAGNOSIS — Z9981 Dependence on supplemental oxygen: Secondary | ICD-10-CM | POA: Diagnosis not present

## 2022-11-03 DIAGNOSIS — Z7951 Long term (current) use of inhaled steroids: Secondary | ICD-10-CM | POA: Diagnosis not present

## 2022-11-03 DIAGNOSIS — J9621 Acute and chronic respiratory failure with hypoxia: Secondary | ICD-10-CM | POA: Diagnosis not present

## 2022-11-03 DIAGNOSIS — F10239 Alcohol dependence with withdrawal, unspecified: Secondary | ICD-10-CM | POA: Diagnosis not present

## 2022-11-03 DIAGNOSIS — E119 Type 2 diabetes mellitus without complications: Secondary | ICD-10-CM | POA: Diagnosis not present

## 2022-11-03 DIAGNOSIS — F17210 Nicotine dependence, cigarettes, uncomplicated: Secondary | ICD-10-CM | POA: Diagnosis not present

## 2022-11-03 DIAGNOSIS — Z7901 Long term (current) use of anticoagulants: Secondary | ICD-10-CM | POA: Diagnosis not present

## 2022-11-03 DIAGNOSIS — Z6831 Body mass index (BMI) 31.0-31.9, adult: Secondary | ICD-10-CM | POA: Diagnosis not present

## 2022-11-03 DIAGNOSIS — Z7984 Long term (current) use of oral hypoglycemic drugs: Secondary | ICD-10-CM | POA: Diagnosis not present

## 2022-11-03 DIAGNOSIS — I5032 Chronic diastolic (congestive) heart failure: Secondary | ICD-10-CM | POA: Diagnosis not present

## 2022-11-03 DIAGNOSIS — N400 Enlarged prostate without lower urinary tract symptoms: Secondary | ICD-10-CM | POA: Diagnosis not present

## 2022-11-03 DIAGNOSIS — I714 Abdominal aortic aneurysm, without rupture, unspecified: Secondary | ICD-10-CM | POA: Diagnosis not present

## 2022-11-03 DIAGNOSIS — I959 Hypotension, unspecified: Secondary | ICD-10-CM | POA: Diagnosis not present

## 2022-11-03 DIAGNOSIS — J440 Chronic obstructive pulmonary disease with acute lower respiratory infection: Secondary | ICD-10-CM | POA: Diagnosis not present

## 2022-11-03 DIAGNOSIS — J441 Chronic obstructive pulmonary disease with (acute) exacerbation: Secondary | ICD-10-CM | POA: Diagnosis not present

## 2022-11-03 DIAGNOSIS — I480 Paroxysmal atrial fibrillation: Secondary | ICD-10-CM | POA: Diagnosis not present

## 2022-11-09 ENCOUNTER — Other Ambulatory Visit: Payer: Self-pay | Admitting: Physician Assistant

## 2022-11-09 DIAGNOSIS — R7309 Other abnormal glucose: Secondary | ICD-10-CM

## 2022-11-10 DIAGNOSIS — J9621 Acute and chronic respiratory failure with hypoxia: Secondary | ICD-10-CM | POA: Diagnosis not present

## 2022-11-10 DIAGNOSIS — Z9981 Dependence on supplemental oxygen: Secondary | ICD-10-CM | POA: Diagnosis not present

## 2022-11-10 DIAGNOSIS — I959 Hypotension, unspecified: Secondary | ICD-10-CM | POA: Diagnosis not present

## 2022-11-10 DIAGNOSIS — D649 Anemia, unspecified: Secondary | ICD-10-CM | POA: Diagnosis not present

## 2022-11-10 DIAGNOSIS — Z7951 Long term (current) use of inhaled steroids: Secondary | ICD-10-CM | POA: Diagnosis not present

## 2022-11-10 DIAGNOSIS — N400 Enlarged prostate without lower urinary tract symptoms: Secondary | ICD-10-CM | POA: Diagnosis not present

## 2022-11-10 DIAGNOSIS — E119 Type 2 diabetes mellitus without complications: Secondary | ICD-10-CM | POA: Diagnosis not present

## 2022-11-10 DIAGNOSIS — F10239 Alcohol dependence with withdrawal, unspecified: Secondary | ICD-10-CM | POA: Diagnosis not present

## 2022-11-10 DIAGNOSIS — Z6831 Body mass index (BMI) 31.0-31.9, adult: Secondary | ICD-10-CM | POA: Diagnosis not present

## 2022-11-10 DIAGNOSIS — Z7901 Long term (current) use of anticoagulants: Secondary | ICD-10-CM | POA: Diagnosis not present

## 2022-11-10 DIAGNOSIS — E669 Obesity, unspecified: Secondary | ICD-10-CM | POA: Diagnosis not present

## 2022-11-10 DIAGNOSIS — J440 Chronic obstructive pulmonary disease with acute lower respiratory infection: Secondary | ICD-10-CM | POA: Diagnosis not present

## 2022-11-10 DIAGNOSIS — J441 Chronic obstructive pulmonary disease with (acute) exacerbation: Secondary | ICD-10-CM | POA: Diagnosis not present

## 2022-11-10 DIAGNOSIS — E785 Hyperlipidemia, unspecified: Secondary | ICD-10-CM | POA: Diagnosis not present

## 2022-11-10 DIAGNOSIS — I480 Paroxysmal atrial fibrillation: Secondary | ICD-10-CM | POA: Diagnosis not present

## 2022-11-10 DIAGNOSIS — Z7984 Long term (current) use of oral hypoglycemic drugs: Secondary | ICD-10-CM | POA: Diagnosis not present

## 2022-11-10 DIAGNOSIS — I714 Abdominal aortic aneurysm, without rupture, unspecified: Secondary | ICD-10-CM | POA: Diagnosis not present

## 2022-11-10 DIAGNOSIS — J188 Other pneumonia, unspecified organism: Secondary | ICD-10-CM | POA: Diagnosis not present

## 2022-11-10 DIAGNOSIS — I5032 Chronic diastolic (congestive) heart failure: Secondary | ICD-10-CM | POA: Diagnosis not present

## 2022-11-10 DIAGNOSIS — F17210 Nicotine dependence, cigarettes, uncomplicated: Secondary | ICD-10-CM | POA: Diagnosis not present

## 2022-11-11 ENCOUNTER — Other Ambulatory Visit: Payer: Self-pay | Admitting: Pharmacist

## 2022-11-11 ENCOUNTER — Telehealth: Payer: Self-pay | Admitting: Pharmacist

## 2022-11-11 NOTE — Progress Notes (Signed)
11/11/2022 Name: HARVIR CASTLEBERRY MRN: [id number] DOB: 19-Nov-1940  Attempted to contact patient for scheduled appointment for medication management. Left HIPAA compliant message for patient to return my call at their convenience.   Lynnda Shields, PharmD, BCPS Clinical Pharmacist [facility]

## 2022-11-16 DIAGNOSIS — J449 Chronic obstructive pulmonary disease, unspecified: Secondary | ICD-10-CM | POA: Diagnosis not present

## 2022-11-18 DIAGNOSIS — I480 Paroxysmal atrial fibrillation: Secondary | ICD-10-CM | POA: Diagnosis not present

## 2022-11-18 DIAGNOSIS — Z7951 Long term (current) use of inhaled steroids: Secondary | ICD-10-CM | POA: Diagnosis not present

## 2022-11-18 DIAGNOSIS — N400 Enlarged prostate without lower urinary tract symptoms: Secondary | ICD-10-CM | POA: Diagnosis not present

## 2022-11-18 DIAGNOSIS — J188 Other pneumonia, unspecified organism: Secondary | ICD-10-CM | POA: Diagnosis not present

## 2022-11-18 DIAGNOSIS — J441 Chronic obstructive pulmonary disease with (acute) exacerbation: Secondary | ICD-10-CM | POA: Diagnosis not present

## 2022-11-18 DIAGNOSIS — E119 Type 2 diabetes mellitus without complications: Secondary | ICD-10-CM | POA: Diagnosis not present

## 2022-11-18 DIAGNOSIS — I5032 Chronic diastolic (congestive) heart failure: Secondary | ICD-10-CM | POA: Diagnosis not present

## 2022-11-18 DIAGNOSIS — Z7901 Long term (current) use of anticoagulants: Secondary | ICD-10-CM | POA: Diagnosis not present

## 2022-11-18 DIAGNOSIS — I959 Hypotension, unspecified: Secondary | ICD-10-CM | POA: Diagnosis not present

## 2022-11-18 DIAGNOSIS — F10239 Alcohol dependence with withdrawal, unspecified: Secondary | ICD-10-CM | POA: Diagnosis not present

## 2022-11-18 DIAGNOSIS — F17210 Nicotine dependence, cigarettes, uncomplicated: Secondary | ICD-10-CM | POA: Diagnosis not present

## 2022-11-18 DIAGNOSIS — J9621 Acute and chronic respiratory failure with hypoxia: Secondary | ICD-10-CM | POA: Diagnosis not present

## 2022-11-18 DIAGNOSIS — D649 Anemia, unspecified: Secondary | ICD-10-CM | POA: Diagnosis not present

## 2022-11-18 DIAGNOSIS — Z6831 Body mass index (BMI) 31.0-31.9, adult: Secondary | ICD-10-CM | POA: Diagnosis not present

## 2022-11-18 DIAGNOSIS — I714 Abdominal aortic aneurysm, without rupture, unspecified: Secondary | ICD-10-CM | POA: Diagnosis not present

## 2022-11-18 DIAGNOSIS — E669 Obesity, unspecified: Secondary | ICD-10-CM | POA: Diagnosis not present

## 2022-11-18 DIAGNOSIS — Z9981 Dependence on supplemental oxygen: Secondary | ICD-10-CM | POA: Diagnosis not present

## 2022-11-18 DIAGNOSIS — Z7984 Long term (current) use of oral hypoglycemic drugs: Secondary | ICD-10-CM | POA: Diagnosis not present

## 2022-11-18 DIAGNOSIS — E785 Hyperlipidemia, unspecified: Secondary | ICD-10-CM | POA: Diagnosis not present

## 2022-11-18 DIAGNOSIS — J440 Chronic obstructive pulmonary disease with acute lower respiratory infection: Secondary | ICD-10-CM | POA: Diagnosis not present

## 2022-11-19 DIAGNOSIS — J449 Chronic obstructive pulmonary disease, unspecified: Secondary | ICD-10-CM | POA: Diagnosis not present

## 2022-12-09 ENCOUNTER — Other Ambulatory Visit (HOSPITAL_COMMUNITY): Payer: Self-pay

## 2022-12-14 NOTE — Assessment & Plan Note (Addendum)
Control: well controlled Recommend annual eye exams. Medicines: On Jardiance 10 mg daily. Continue to work on eating a healthy diet and exercise.  Wash feet daily.  Check feet daily for sores. Order diabetic shoes for you and recommend using stretchy diabetic socks to protect your feet and keep them clean. Labs drawn today.

## 2022-12-14 NOTE — Assessment & Plan Note (Signed)
Well controlled. Continue toprol xl, spironalctone, and torsemide.

## 2022-12-14 NOTE — Assessment & Plan Note (Signed)
Previously well controlled Continue Synthroid at current dose  Recheck TSH and adjust Synthroid as indicated   

## 2022-12-14 NOTE — Assessment & Plan Note (Signed)
Well controlled.  No changes to medicines. Atorvastatin 40 mg daily Continue to work on eating a healthy diet and exercise.  Labs drawn today.

## 2022-12-14 NOTE — Progress Notes (Signed)
Subjective:  Patient ID: Kyle Farley, male    DOB: 18-Nov-1940  Age: 81 y.o. MRN: [id number]  Chief Complaint  Patient presents with   Medical Management of Chronic Issues    HPI History of Present Illness The patient, with a history of COPD and atrial fibrillation, presents with a week-long cold and intermittent chest pain. He describes the chest pain as a sensation of gas, localized centrally, occurring approximately once a week, and lasting a few minutes. He denies associated shortness of breath, nausea, vomiting, or diaphoresis beyond his baseline from COPD.  Regarding his COPD, he reports shortness of breath with exertion and uses albuterol as needed. He was previously given a trial on nebulized Brovana and budesonide, but found it inconvenient and expensive, so he returned to using Trelegy. He uses oxygen at 3 liters at night and has additional oxygen in his truck for use as needed during the day.  He also has a history of atrial fibrillation, for which he takes amiodarone, toprol xl  and Eliquis. He reports taking amiodarone once daily, though it was initially prescribed twice daily. He denies any symptoms of racing or irregular heartbeat.   The patient also reports peripheral neuropathy in his feet, which he describes as numbness and a sensation of walking on lumps. He admits to not regularly wearing socks or shoes, which has led to dirty feet. He denies any open sores on his feet. He is unable to see the bottom of his feet.   Hyperlipidemia:  Atorvastatin 40 mg daily.  Hypertension:  Amlodipine 5 mg daily, metoprolol 50 mg daily, spironolactone 25 mg daily. .    Diabetes: not checking sugars. On farxiga 10 mg daily for CONGESTIVE HEART FAILURE. Overdue for eye exam. Not checking feet but every 2-3 days.   Hypothyroidism: synthroid 50 mcg daily.      10/20/2022    9:17 AM 08/05/2022   11:08 AM 05/27/2022    4:12 PM 04/02/2022   11:24 AM 04/02/2022   10:41 AM  Depression screen  PHQ 2/9  Decreased Interest 0 0 0 0 0  Down, Depressed, Hopeless 0 0 0 0 0  PHQ - 2 Score 0 0 0 0 0  Altered sleeping  0 0    Tired, decreased energy  0 2    Change in appetite  0 2    Feeling bad or failure about yourself   0 0    Trouble concentrating  0 0    Moving slowly or fidgety/restless  0 3    Suicidal thoughts  0 0    PHQ-9 Score  0 7    Difficult doing work/chores  Not difficult at all Somewhat difficult          12/15/2022    9:42 AM  Fall Risk   Falls in the past year? 0  Number falls in past yr: 0  Injury with Fall? 0  Risk for fall due to : No Fall Risks  Follow up Falls evaluation completed    Patient Care Team: Blane Ohara, MD as PCP - General (Family Medicine) Regan Lemming, MD as Consulting Physician (Cardiology) Baldo Daub, MD as Consulting Physician (Cardiology) Ricky Stabs, RN as [facility] Care Management (General Practice)   Review of Systems  Constitutional:  Negative for appetite change, fatigue and fever.  HENT:  Negative for congestion, ear pain, sinus pressure and sore throat.   Respiratory:  Negative for cough, chest tightness, shortness of breath and wheezing.  Cardiovascular:  Positive for chest pain. Negative for palpitations.  Gastrointestinal:  Negative for abdominal pain, constipation, diarrhea, nausea and vomiting.  Genitourinary:  Negative for dysuria and hematuria.  Musculoskeletal:  Negative for arthralgias, back pain, joint swelling and myalgias.  Skin:  Negative for rash.  Neurological:  Negative for dizziness, weakness and headaches.  Psychiatric/Behavioral:  Negative for dysphoric mood. The patient is not nervous/anxious.     Current Outpatient Medications on File Prior to Visit  Medication Sig Dispense Refill   albuterol (VENTOLIN HFA) 108 (90 Base) MCG/ACT inhaler Inhale 2 puffs into the lungs every 4 (four) hours as needed for wheezing or shortness of breath. 48 g 3   amLODipine (NORVASC) 5 MG tablet Take 5 mg  by mouth daily.     apixaban (ELIQUIS) 5 MG TABS tablet Take 1 tablet (5 mg total) by mouth 2 (two) times daily. 180 tablet 3   atorvastatin (LIPITOR) 40 MG tablet Take 1 tablet (40 mg total) by mouth daily. 90 tablet 3   benzonatate (TESSALON) 100 MG capsule Take 2 capsules (200 mg total) by mouth 3 (three) times daily as needed for cough. 30 capsule 0   cetirizine (ZYRTEC) 10 MG tablet Take 10 mg by mouth at bedtime.     diphenhydrAMINE HCl, Sleep, (ZZZQUIL PO) Take 1 tablet by mouth at bedtime.     EQ MUCUS RELIEF 12 HOUR MAX ST 1200 MG TB12 Take 1 tablet (1,200 mg total) by mouth 2 (two) times daily. 60 tablet 0   FARXIGA 10 MG TABS tablet Take 1 tablet by mouth once daily 30 tablet 2   levothyroxine (SYNTHROID) 50 MCG tablet Take 50 mcg by mouth daily before breakfast.     metoprolol succinate (TOPROL-XL) 50 MG 24 hr tablet Take 12.5 mg by mouth daily. Take with or immediately following a meal.     montelukast (SINGULAIR) 10 MG tablet Take 1 tablet (10 mg total) by mouth daily at 12 noon. 90 tablet 3   nitroGLYCERIN (NITROSTAT) 0.4 MG SL tablet Place 1 tablet (0.4 mg total) under the tongue every 5 (five) minutes as needed for chest pain. 50 tablet 1   OXYGEN Inhale 3 L into the lungs as needed (shortness of breath).     pantoprazole (PROTONIX) 40 MG tablet Take 1 tablet (40 mg total) by mouth daily. 90 tablet 3   thiamine (VITAMIN B1) 100 MG tablet Take 100 mg by mouth daily.     torsemide (DEMADEX) 20 MG tablet Take 1 tablet by mouth twice daily as needed 180 tablet 0   TRELEGY ELLIPTA 200-62.5-25 MCG/ACT AEPB Inhale 1 puff into the lungs daily.     No current facility-administered medications on file prior to visit.   Past Medical History:  Diagnosis Date   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 (HCC) 11/10/2021   Acquired hypothyroidism 11/10/2021   Acute hypoxic respiratory failure (HCC) 04/25/2022   Acute on chronic diastolic heart failure (HCC) 04/25/2022   Acute on chronic  systolic heart failure (HCC) 11/10/2021   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/10/2021   Alcohol use 09/12/2020   Anemia of chronic disease 04/25/2022   Arthritis    Atherosclerosis of native arteries of extremities with intermittent claudication, bilateral legs (HCC) 11/10/2021   Bilateral leg edema 02/16/2022   BPH (benign prostatic hyperplasia) 11/10/2021   CHF (congestive heart failure) (HCC)    Chronic hyponatremia 04/25/2022   Chronic respiratory failure with hypoxia (HCC) 05/09/2022   Cigarette smoker 10/21/2014  Class 1 obesity 04/25/2022   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)    Dyspnea    Dysrhythmia    atrial fibrillation   Encounter for prostate cancer screening 02/16/2022   Encounter for screening for lung cancer 02/16/2022   Essential hypertension 10/21/2014   Femur fracture, right (HCC) 09/12/2020   GERD (gastroesophageal reflux disease)    History of kidney stones    Hyperlipidemia 01/24/2017   Hypertension    Hyponatremia 04/26/2022   Kidney stones 01/24/2017   Paroxysmal atrial fibrillation (HCC) 10/21/2014   Pressure injury of skin 09/16/2020   SOB (shortness of breath) 05/10/2022   Tobacco abuse 02/16/2022   Past Surgical History:  Procedure Laterality Date   LEFT HEART CATH AND CORONARY ANGIOGRAPHY N/A 09/15/2020   Procedure: LEFT HEART CATH AND CORONARY ANGIOGRAPHY;  Surgeon: Tonny Bollman, MD;  Location: [facility] INVASIVE CV LAB;  Service: Cardiovascular;  Laterality: N/A;   LEG SURGERY Right    steal pin placed in right leg 35 years ago   ORIF FEMUR FRACTURE Right 09/15/2020   Procedure: OPEN REDUCTION INTERNAL FIXATION FEMORAL SHAFT FRACTURE;  Surgeon: Roby Lofts, MD;  Location: [facility] OR;  Service: Orthopedics;  Laterality: Right;    Family History  Problem Relation Age of Onset   Cancer Mother    Diabetes Mother    Breast cancer Sister    Social History   Socioeconomic History   Marital status: Widowed    Spouse  name: Not on file   Number of children: 3   Years of education: Not on file   Highest education level: High school graduate  Occupational History   Occupation: retired  Tobacco Use   Smoking status: Every Day    Current packs/day: 1.00    Average packs/day: 1 pack/day for 62.0 years (62.0 ttl pk-yrs)    Types: Cigarettes, Cigars   Smokeless tobacco: Never  Vaping Use   Vaping status: Never Used  Substance and Sexual Activity   Alcohol use: Yes    Alcohol/week: 5.0 standard drinks of alcohol    Types: 5 Standard drinks or equivalent per week    Comment: 4-5 beers per day   Drug use: No   Sexual activity: Not Currently  Other Topics Concern   Not on file  Social History Narrative   Not on file   Social Determinants of Health   Financial Resource Strain: Low Risk  (04/26/2022)   Overall Financial Resource Strain (CARDIA)    Difficulty of Paying Living Expenses: Not very hard  Food Insecurity: No Food Insecurity (04/15/2022)   Hunger Vital Sign    Worried About Running Out of Food in the Last Year: Never true    Ran Out of Food in the Last Year: Never true  Transportation Needs: No Transportation Needs (04/26/2022)   PRAPARE - Administrator, Civil Service (Medical): No    Lack of Transportation (Non-Medical): No  Physical Activity: Inactive (04/15/2022)   Exercise Vital Sign    Days of Exercise per Week: 0 days    Minutes of Exercise per Session: 0 min  Stress: No Stress Concern Present (04/15/2022)   [employer] of Occupational Health - Occupational Stress Questionnaire    Feeling of Stress : Not at all  Social Connections: Moderately Isolated (04/15/2022)   Social Connection and Isolation Panel [NHANES]    Frequency of Communication with Friends and Family: More than three times a week    Frequency of Social Gatherings with Friends and Family:  More than three times a week    Attends Religious Services: More than 4 times per year    Active Member of Clubs or  Organizations: No    Attends Banker Meetings: Never    Marital Status: Widowed    Objective:  BP 128/68 (BP Location: Left Arm, Patient Position: Sitting)   Pulse 61   Temp 97.8 F (36.6 C) (Temporal)   Ht 5' 7.5" (1.715 m)   Wt 230 lb (104.3 kg)   SpO2 91%   BMI 35.49 kg/m      12/15/2022    9:37 AM 10/20/2022    9:13 AM 10/14/2022    2:52 PM  BP/Weight  Systolic BP 128 132 110  Diastolic BP 68 68 50  Wt. (Lbs) 230 221 223  BMI 35.49 kg/m2 34.1 kg/m2 34.41 kg/m2    Physical Exam Vitals reviewed.  Constitutional:      Appearance: Normal appearance. He is obese.  Neck:     Vascular: No carotid bruit.  Cardiovascular:     Rate and Rhythm: Normal rate. Rhythm irregular.     Pulses: Normal pulses.     Heart sounds: Normal heart sounds.  Pulmonary:     Effort: Pulmonary effort is normal.     Breath sounds: Normal breath sounds. No wheezing, rhonchi or rales.  Abdominal:     General: Bowel sounds are normal.     Palpations: Abdomen is soft.     Tenderness: There is no abdominal tenderness.  Neurological:     Mental Status: He is alert.  Psychiatric:        Mood and Affect: Mood normal.        Behavior: Behavior normal.     Diabetic Foot Exam - Simple   Simple Foot Form Diabetic Foot exam was performed with the following findings: Yes 12/15/2022 10:17 AM  Visual Inspection See comments: Yes Sensation Testing See comments: Yes Pulse Check Posterior Tibialis and Dorsalis pulse intact bilaterally: Yes Comments Numbness Bl feet.  Calluses.  Thickened nails.  Decrease sensation all along the bottom of his feet.  Feet are filthy.  Poorly fitting shoes.       Lab Results  Component Value Date   WBC 10.7 12/15/2022   HGB 13.8 12/15/2022   HCT 42.7 12/15/2022   PLT 223 12/15/2022   GLUCOSE 103 (H) 12/15/2022   CHOL 168 08/25/2022   TRIG 76 08/25/2022   HDL 87 08/25/2022   LDLCALC 67 08/25/2022   ALT 16 12/15/2022   AST 26 12/15/2022    NA 137 12/15/2022   K 4.2 12/15/2022   CL 96 12/15/2022   CREATININE 1.14 12/15/2022   BUN 9 12/15/2022   CO2 24 12/15/2022   TSH 3.770 05/05/2022   HGBA1C 6.1 (H) 12/15/2022      Assessment & Plan:    Diabetic polyneuropathy associated with type 2 diabetes mellitus (HCC) Assessment & Plan: Control: well controlled Recommend annual eye exams. Medicines: On Farxiga 10 mg daily. Continue to work on eating a healthy diet and exercise.  Wash feet daily.   Check feet daily for sores.  Order diabetic shoes Recommend using stretchy diabetic socks to protect your feet and keep them clean. Labs drawn today.     Orders: -     Hemoglobin A1c  Primary hypertension Assessment & Plan: Well controlled. Continue toprol xl, spironalctone, and torsemide.   Orders: -     CBC with Differential/Platelet -     Comprehensive metabolic panel  Acquired hypothyroidism Assessment & Plan: Previously well controlled Continue Synthroid at current dose  Recheck TSH and adjust Synthroid as indicated     Mixed hyperlipidemia Assessment & Plan: Well controlled.  No changes to medicines. Atorvastatin 40 mg daily Continue to work on eating a healthy diet and exercise.  Labs drawn today.     Other iron deficiency anemia Assessment & Plan: Check labs  Orders: -     Iron, TIBC and Ferritin Panel  Medication monitoring encounter Assessment & Plan: follow up with your cardiologist to clarify the correct dosage of Amiodarone.  Orders: -     TSH  Other chest pain Assessment & Plan: Chest pain is due to gastroesophageal reflux disease (GERD) or indigestion.We will continue with your current management as there are no alarming symptoms like shortness of breath or nausea.    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 [facility]) Assessment & Plan: Back on trelegy one inhalation daily. albuterol HFA 4-5 times per day as needed, mucinex mucous relief, tessalon as needed for cough.  Oxygen 3 L.  Sees pulmonology.    Paroxysmal atrial fibrillation [facility]) Assessment & Plan: Follow up with your cardiologist to clarify the correct dosage of Amiodarone.  Orders: -     Amiodarone HCl; Take 1 tablet (200 mg total) by mouth daily.   General Health Maintenance -Check immunization status for shingles vaccine. -Recommend annual eye exam. -Order routine blood work.  Meds ordered this encounter  Medications   amiodarone (PACERONE) 200 MG tablet    Sig: Take 1 tablet (200 mg total) by mouth daily.    Orders Placed This Encounter  Procedures   CBC with Differential/Platelet   Comprehensive metabolic panel   Hemoglobin A1c   Iron, TIBC and Ferritin Panel   TSH     Follow-up: Return in about 3 months (around 03/17/2023) for chronic follow up.   I,Marla I Leal-Borjas,acting as a scribe for Blane Ohara, MD.,have documented all relevant documentation on the behalf of Blane Ohara, MD,as directed by  Blane Ohara, MD while in the presence of Blane Ohara, MD.   An After Visit Summary was printed and given to the patient.  I attest that I have reviewed this visit and agree with the plan scribed by my staff.   Blane Ohara, MD [facility] [phone]

## 2022-12-15 ENCOUNTER — Other Ambulatory Visit: Payer: Self-pay | Admitting: Family Medicine

## 2022-12-15 ENCOUNTER — Ambulatory Visit (INDEPENDENT_AMBULATORY_CARE_PROVIDER_SITE_OTHER): Payer: Medicare HMO | Admitting: Family Medicine

## 2022-12-15 ENCOUNTER — Encounter: Payer: Self-pay | Admitting: Family Medicine

## 2022-12-15 VITALS — BP 128/68 | HR 61 | Temp 97.8°F | Ht 67.5 in | Wt 230.0 lb

## 2022-12-15 DIAGNOSIS — I480 Paroxysmal atrial fibrillation: Secondary | ICD-10-CM | POA: Diagnosis not present

## 2022-12-15 DIAGNOSIS — E782 Mixed hyperlipidemia: Secondary | ICD-10-CM | POA: Diagnosis not present

## 2022-12-15 DIAGNOSIS — R0789 Other chest pain: Secondary | ICD-10-CM | POA: Diagnosis not present

## 2022-12-15 DIAGNOSIS — Z7951 Long term (current) use of inhaled steroids: Secondary | ICD-10-CM

## 2022-12-15 DIAGNOSIS — E1142 Type 2 diabetes mellitus with diabetic polyneuropathy: Secondary | ICD-10-CM | POA: Diagnosis not present

## 2022-12-15 DIAGNOSIS — D508 Other iron deficiency anemias: Secondary | ICD-10-CM | POA: Diagnosis not present

## 2022-12-15 DIAGNOSIS — J449 Chronic obstructive pulmonary disease, unspecified: Secondary | ICD-10-CM

## 2022-12-15 DIAGNOSIS — Z5181 Encounter for therapeutic drug level monitoring: Secondary | ICD-10-CM | POA: Diagnosis not present

## 2022-12-15 DIAGNOSIS — I10 Essential (primary) hypertension: Secondary | ICD-10-CM | POA: Diagnosis not present

## 2022-12-15 DIAGNOSIS — E039 Hypothyroidism, unspecified: Secondary | ICD-10-CM

## 2022-12-15 NOTE — Patient Instructions (Signed)
VISIT SUMMARY:  During today's visit, we discussed your recent cold, intermittent chest pain, and ongoing management of your COPD, atrial fibrillation, and peripheral neuropathy. We reviewed your current medications and made some recommendations to help manage your symptoms and improve your overall health.  YOUR PLAN:  -CHEST PAIN: Your chest pain is likely due to gastroesophageal reflux disease (GERD) or indigestion. We will continue with your current management as there are no alarming symptoms like shortness of breath or nausea.  -CHRONIC OBSTRUCTIVE PULMONARY DISEASE (COPD): COPD is a chronic lung condition that makes it hard to breathe. You will continue using Trelegy and Albuterol as needed, and we will order a repeat CT scan of your chest to monitor your condition.  -PERIPHERAL NEUROPATHY: Peripheral neuropathy is nerve damage that causes numbness and discomfort in your feet. We will order diabetic shoes for you and recommend using stretchy diabetic socks to protect your feet and keep them clean.  -ATRIAL FIBRILLATION: Atrial fibrillation is an irregular and often rapid heart rate. We need to clarify your Amiodarone dosage with your cardiologist to ensure you are taking the correct amount.  -GENERAL HEALTH MAINTENANCE: We will check your immunization status for the shingles vaccine, recommend an annual eye exam, and order routine blood work to monitor your overall health.  INSTRUCTIONS:  Please follow up with your cardiologist to clarify the correct dosage of Amiodarone. Additionally, schedule an appointment for a CT scan of your chest and ensure you get your routine blood work done. Don't forget to check your immunization status for the shingles vaccine and schedule your annual eye exam.

## 2022-12-16 LAB — CBC WITH DIFFERENTIAL/PLATELET
Basophils Absolute: 0.1 10*3/uL (ref 0.0–0.2)
Basos: 1 %
EOS (ABSOLUTE): 0.1 10*3/uL (ref 0.0–0.4)
Eos: 1 %
Hematocrit: 42.7 % (ref 37.5–51.0)
Hemoglobin: 13.8 g/dL (ref 13.0–17.7)
Immature Grans (Abs): 0.1 10*3/uL (ref 0.0–0.1)
Immature Granulocytes: 1 %
Lymphocytes Absolute: 1.7 10*3/uL (ref 0.7–3.1)
Lymphs: 15 %
MCH: 29.8 pg (ref 26.6–33.0)
MCHC: 32.3 g/dL (ref 31.5–35.7)
MCV: 92 fL (ref 79–97)
Monocytes Absolute: 0.8 10*3/uL (ref 0.1–0.9)
Monocytes: 8 %
Neutrophils Absolute: 8 10*3/uL — ABNORMAL HIGH (ref 1.4–7.0)
Neutrophils: 74 %
Platelets: 223 10*3/uL (ref 150–450)
RBC: 4.63 x10E6/uL (ref 4.14–5.80)
RDW: 14.8 % (ref 11.6–15.4)
WBC: 10.7 10*3/uL (ref 3.4–10.8)

## 2022-12-16 LAB — COMPREHENSIVE METABOLIC PANEL
ALT: 16 [IU]/L (ref 0–44)
AST: 26 [IU]/L (ref 0–40)
Albumin: 4.4 g/dL (ref 3.7–4.7)
Alkaline Phosphatase: 92 [IU]/L (ref 44–121)
BUN/Creatinine Ratio: 8 — ABNORMAL LOW (ref 10–24)
BUN: 9 mg/dL (ref 8–27)
Bilirubin Total: 0.4 mg/dL (ref 0.0–1.2)
CO2: 24 mmol/L (ref 20–29)
Calcium: 9.8 mg/dL (ref 8.6–10.2)
Chloride: 96 mmol/L (ref 96–106)
Creatinine, Ser: 1.14 mg/dL (ref 0.76–1.27)
Globulin, Total: 2.7 g/dL (ref 1.5–4.5)
Glucose: 103 mg/dL — ABNORMAL HIGH (ref 70–99)
Potassium: 4.2 mmol/L (ref 3.5–5.2)
Sodium: 137 mmol/L (ref 134–144)
Total Protein: 7.1 g/dL (ref 6.0–8.5)
eGFR: 65 mL/min/{1.73_m2} (ref >59)

## 2022-12-16 LAB — IRON,TIBC AND FERRITIN PANEL
Ferritin: 33 ng/mL (ref 30–400)
Iron Saturation: 13 % — ABNORMAL LOW (ref 15–55)
Iron: 61 ug/dL (ref 38–169)
Total Iron Binding Capacity: 460 ug/dL — ABNORMAL HIGH (ref 250–450)
UIBC: 399 ug/dL — ABNORMAL HIGH (ref 111–343)

## 2022-12-16 LAB — HEMOGLOBIN A1C
Est. average glucose Bld gHb Est-mCnc: 128 mg/dL
Hgb A1c MFr Bld: 6.1 % — ABNORMAL HIGH (ref 4.8–5.6)

## 2022-12-17 DIAGNOSIS — J449 Chronic obstructive pulmonary disease, unspecified: Secondary | ICD-10-CM | POA: Diagnosis not present

## 2022-12-18 DIAGNOSIS — Z5181 Encounter for therapeutic drug level monitoring: Secondary | ICD-10-CM

## 2022-12-18 DIAGNOSIS — R0789 Other chest pain: Secondary | ICD-10-CM | POA: Insufficient documentation

## 2022-12-18 HISTORY — DX: Encounter for therapeutic drug level monitoring: Z51.81

## 2022-12-18 HISTORY — DX: Other chest pain: R07.89

## 2022-12-18 NOTE — Assessment & Plan Note (Signed)
Follow up with your cardiologist to clarify the correct dosage of Amiodarone.

## 2022-12-18 NOTE — Assessment & Plan Note (Signed)
Continue using Trelegy and Albuterol as needed, and we will order a repeat CT scan of your chest to monitor your condition.

## 2022-12-18 NOTE — Assessment & Plan Note (Signed)
follow up with your cardiologist to clarify the correct dosage of Amiodarone.

## 2022-12-18 NOTE — Assessment & Plan Note (Signed)
Chest pain is due to gastroesophageal reflux disease (GERD) or indigestion.We will continue with your current management as there are no alarming symptoms like shortness of breath or nausea.

## 2022-12-18 NOTE — Assessment & Plan Note (Signed)
Check labs 

## 2022-12-19 DIAGNOSIS — J449 Chronic obstructive pulmonary disease, unspecified: Secondary | ICD-10-CM | POA: Diagnosis not present

## 2022-12-19 MED ORDER — AMIODARONE HCL 200 MG PO TABS: 200.0000 mg | ORAL_TABLET | Freq: Every day | ORAL | Status: DC

## 2022-12-20 ENCOUNTER — Other Ambulatory Visit: Payer: Self-pay

## 2022-12-20 ENCOUNTER — Other Ambulatory Visit: Payer: Self-pay | Admitting: *Deleted

## 2022-12-20 ENCOUNTER — Telehealth: Payer: Self-pay | Admitting: *Deleted

## 2022-12-20 NOTE — Addendum Note (Signed)
Addended byBlane Ohara on: 12/20/2022 10:41 PM   Modules accepted: Orders

## 2022-12-20 NOTE — Patient Instructions (Signed)
Visit Information  Thank you for taking time to visit with me today. Please don't hesitate to contact me if I can be of assistance to you before our next scheduled telephone appointment.  Following are the goals we discussed today:   Goals Addressed             This Visit's Progress    RNCM Care Management  Expected Outcome:  Monitor, Self-Manage and Reduce Symptoms of Afib       Current Barriers:  Chronic Disease Management support and education needs related to effective management of AFIB  Planned Interventions: Provider order and care plan reviewed.  Works with pharm D on a regular basis.  The patient denies any acute changes in his AFIB or heart health.  Counseled on increased risk of stroke due to Afib and benefits of anticoagulation for stroke prevention           Reviewed importance of adherence to anticoagulant exactly as prescribed. Is compliant with medications. No recent changes in medications. Advised patient to discuss changes in AFIB, questions or concerns with provider Counseled on bleeding risk associated with AFIB and importance of self-monitoring for signs/symptoms of bleeding Counseled on avoidance of NSAIDs due to increased bleeding risk with anticoagulants Counseled on importance of regular laboratory monitoring as prescribed. Has labs on a regular basis.  Counseled on seeking medical attention after a head injury or if there is blood in the urine/stool. Review on being safe and monitoring for acute changes Afib action plan reviewed Screening for signs and symptoms of depression related to chronic disease state Assessed social determinant of health barriers  Symptom Management: Take medications as prescribed   Attend all scheduled provider appointments Call provider office for new concerns or questions  call the Suicide and Crisis Lifeline: 988 call the [place] National Suicide Prevention Lifeline: 5753283124 or TTY: 581-257-0511 TTY (512) [phone]) to talk to  a trained counselor call 1-800-273-TALK (toll free, 24 hour hotline) go to [facility] [address] [place] [phone]) if experiencing a Mental Health or Behavioral Health Crisis  - make a plan to eat healthy - keep all lab appointments - take medicine as prescribed  Follow Up Plan: Telephone follow up appointment with care management team member scheduled for: 01-24-2023 at 1000 am     RNCM Care Management  Expected Outcome:  Monitor, Self-Manage, and Reduce Symptoms of Hypertension       Current Barriers:  Knowledge Deficits related to the importance of calling the providers when out of medications or changes in HTN or heart health Care Coordination needs related to pharmacy support and education, referral pending in a patient with HTN and other chronic conditions Chronic Disease Management support and education needs related to effective management of HTN BP Readings from Last 3 Encounters:  12/15/22 128/68  10/20/22 132/68  10/14/22 (!) 110/50     Planned Interventions: Evaluation of current treatment plan related to hypertension self management and patient's adherence to plan as established by provider. Does not regularly check blood pressure stating his blood pressure is always good. RNCM advised to check daily and keep log. Provided education to patient re: stroke prevention, s/s of heart attack and stroke; Reviewed prescribed diet heart healthy diet. Education on the benefits of eating fresh fruits and vegetables.  Denies any acute changes in his dietary habits.  Reviewed medications with patient and discussed importance of compliance.  Discussed plans with patient for ongoing care management follow up and provided patient with  direct contact information for care management team; Advised patient, providing education and rationale, to monitor blood pressure daily and record, calling PCP for findings outside established parameters;   Reviewed scheduled/upcoming provider appointments including: with PCP on 03-23-2023 Advised patient to discuss changes in his HTN or  heart health with provider; Provided education on prescribed diet heart healthy diet;  Discussed complications of poorly controlled blood pressure such as heart disease, stroke, circulatory complications, vision complications, kidney impairment, sexual dysfunction;  Screening for signs and symptoms of depression related to chronic disease state;  Assessed social determinant of health barriers;   Symptom Management: Take medications as prescribed   Attend all scheduled provider appointments Call provider office for new concerns or questions  call the Suicide and Crisis Lifeline: 988 call the [place] National Suicide Prevention Lifeline: 807-454-4352 or TTY: 4234510377 TTY (306)059-0415) to talk to a trained counselor call 1-800-273-TALK (toll free, 24 hour hotline) go to [facility] [address], [place] [phone]) if experiencing a Mental Health or Behavioral Health Crisis  check blood pressure weekly learn about high blood pressure call doctor for signs and symptoms of high blood pressure develop an action plan for high blood pressure keep all doctor appointments take medications for blood pressure exactly as prescribed report new symptoms to your doctor  Follow Up Plan: Telephone follow up appointment with care management team member scheduled for: 01-24-2023 at 1000 am      RNCM Care Management Expected Outcome:  Monitor, Self-Manage and Reduce Symptoms of Heart Failure       Current Barriers:  Knowledge Deficits related to the benefits of weighing daily to monitor for excess fluid and to prevent an exacerbation of HF Care Coordination needs related to education and support from pharm D for medication management and support  in a patient with HF Chronic Disease Management support and education needs related to  effective management of HF Wt Readings from Last 3 Encounters:  12/15/22 230 lb (104.3 kg)  10/20/22 221 lb (100.2 kg)  10/14/22 223 lb (101.2 kg)     Planned Interventions: Basic overview and discussion of pathophysiology of Heart Failure reviewed. Patient weighs daily and states his weight today was 222 lbs. He does endorse some swelling in his legs/feet and states he does keep then elevated with 2 pillows when in the recliner. He states when he last saw the PCP she mentioned he needed to be followed by a podiatrist. He is requesting follow up on this. Provided education on low sodium diet. Patient states that he is not monitoring his intake and has been eating a lot of leftovers from Thanksgiving. RNCM provided reminder of maintaining a heart healthy diet. Reviewed Heart Failure Action Plan in depth and provided written copy Assessed need for readable accurate scales in home. Weighs daily Discussed importance of daily weight and advised patient to weigh and record daily.  RNCM provided reminder to patient about notifying his provider when his weight is up 2 lbs in a day or 5 lbs in a week.  Reviewed role of diuretics in prevention of fluid overload and management of heart failure.  Discussed the importance of keeping all appointments with provider Advised patient to discuss changes in fluid balance, changes in HF, questions, or concerns with provider Screening for signs and symptoms of depression related to chronic disease state  Assessed social determinant of health barriers EF 50 to 55% on 06-10-2021, education provided on what the EF% was and how it impacts his  chronic conditions  Symptom Management: Take medications as prescribed   Attend all scheduled provider appointments Call provider office for new concerns or questions  call the Suicide and Crisis Lifeline: 988 call the [place] National Suicide Prevention Lifeline: (567)042-5046 or TTY: 810-469-9974 TTY 820-239-1364) to talk to a  trained counselor call 1-800-273-TALK (toll free, 24 hour hotline) go to [facility] [address] [place] [phone]) if experiencing a Mental Health or Behavioral Health Crisis  call office if I gain more than 2 pounds in one day or 5 pounds in one week use salt in moderation watch for swelling in feet, ankles and legs every day weigh myself daily develop a rescue plan follow rescue plan if symptoms flare-up  Follow Up Plan: Telephone follow up appointment with care management team member scheduled for: 01-24-2023 at 10:00 am       RNCM Care Management:  Maintain, Monitor and Self-Manage Symptoms of COPD       Current Barriers:  Knowledge Deficits related to the importance of following the plan of care and taking medications as directed for effective management of COPD Care Coordination needs related to pharmacy needs  in a patient with COPD Chronic Disease Management support and education needs related to effective management of COPD 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 Patient reports increased shortness of breath, stating it takes him at minimum 5 minutes for his breathing to return to normal. He states that he was checking his oxygen frequently and his last check is resting oxygen saturation was 80-81% and this was prior to Thanksgiving. RNCM advised that patient should go to the ER due to increased work of breathing. Patient refusing to go be evaluated and states he has a pulmonary appointment upcoming on 01-10-2024 and he will address it then. He states that he is recovering from a cold that he has had for a few weeks. Advised patient to track and manage COPD triggers. Review of monitoring for  changes that may exacerbate his COPD. The patient is aware and knows to watch for acute changes.  Provided written and verbal instructions on pursed lip breathing and utilized returned demonstration as teach back Provided instruction about proper use of medications used for management of COPD including inhalers. Reports using albuterol more frequently due to increased work of breathing. RNCM PROVIDED REMINDER: ADVISED PATIENT TO SELF ASSESSES COPD ACTION PLAN ZONE AND MAKE APPOINTMENT WITH PROVIDER IF IN THE YELLOW ZONE FOR 48 HOURS WITHOUT IMPROVEMENT Advised patient to engage in light exercise as tolerated 3-5 days a week to aid in the the management of COPD Provided education about and advised patient to utilize infection prevention strategies to reduce risk of respiratory infection Discussed the importance of adequate rest and management of fatigue with COPD Screening for signs and symptoms of depression related to chronic disease state  Assessed social determinant of health barriers  Symptom Management: Take medications as prescribed   Attend all scheduled provider appointments Call provider office for new concerns or questions  call the Suicide and Crisis Lifeline: 988 call the [place] National Suicide Prevention Lifeline: (224) 043-0726 or TTY: 573-203-1519 TTY 7126331837) to talk to a trained counselor call 1-800-273-TALK (toll free, 24 hour hotline) go to [facility]  [address], [place] [phone]) if experiencing a Mental Health or Behavioral Health Crisis  identify and remove indoor air pollutants limit outdoor activity during cold weather listen for public air quality announcements every day develop a rescue plan eliminate symptom triggers at home follow rescue plan if symptoms flare-up  Follow Up Plan: Telephone follow up appointment with care management team member scheduled for: 01-24-2023 at 10:00 am           Our next  appointment is by telephone on 01-24-2023 at 10:00 am  Please call the care guide team at [phone] if you need to cancel or reschedule your appointment.   If you are experiencing a Mental Health or Behavioral Health Crisis or need someone to talk to, please call the Suicide and Crisis Lifeline: 988 call the [place] National Suicide Prevention Lifeline: 564-677-1797 or TTY: 231-569-8430 TTY 867 012 4375) to talk to a trained counselor call 1-800-273-TALK (toll free, 24 hour hotline) call 911   The patient verbalized understanding of instructions, educational materials, and care plan provided today and DECLINED offer to receive copy of patient instructions, educational materials, and care plan.   Telephone follow up appointment with care management team member scheduled for:01-24-2023 at 10:00 am  Danise Edge, BSN RN RN Care Manager  [facility]  Ambulatory Care Management  Direct Number: [phone]

## 2022-12-20 NOTE — Patient Outreach (Signed)
Care Management   Visit Note  12/20/2022 Name: Kyle Farley MRN: [id number] DOB: 22-Oct-1940  Subjective: Kyle Farley is a 82 y.o. year old male who is a primary care patient of [name], Kirsten, MD. The Care Management team was consulted for assistance.      Engaged with patient spoke with patient by telephone.    Goals Addressed             This Visit's Progress    RNCM Care Management  Expected Outcome:  Monitor, Self-Manage and Reduce Symptoms of Afib       Current Barriers:  Chronic Disease Management support and education needs related to effective management of AFIB  Planned Interventions: Provider order and care plan reviewed.  Works with pharm D on a regular basis.  The patient denies any acute changes in his AFIB or heart health.  Counseled on increased risk of stroke due to Afib and benefits of anticoagulation for stroke prevention           Reviewed importance of adherence to anticoagulant exactly as prescribed. Is compliant with medications. No recent changes in medications. Advised patient to discuss changes in AFIB, questions or concerns with provider Counseled on bleeding risk associated with AFIB and importance of self-monitoring for signs/symptoms of bleeding Counseled on avoidance of NSAIDs due to increased bleeding risk with anticoagulants Counseled on importance of regular laboratory monitoring as prescribed. Has labs on a regular basis.  Counseled on seeking medical attention after a head injury or if there is blood in the urine/stool. Review on being safe and monitoring for acute changes Afib action plan reviewed Screening for signs and symptoms of depression related to chronic disease state Assessed social determinant of health barriers  Symptom Management: Take medications as prescribed   Attend all scheduled provider appointments Call provider office for new concerns or questions  call the Suicide and Crisis Lifeline: 988 call the [place] National  Suicide Prevention Lifeline: 364-776-2909 or TTY: 218-374-1886 TTY 850 767 6821) to talk to a trained counselor call 1-800-273-TALK (toll free, 24 hour hotline) go to [facility] [address] [place] [phone]) if experiencing a Mental Health or Behavioral Health Crisis  - make a plan to eat healthy - keep all lab appointments - take medicine as prescribed  Follow Up Plan: Telephone follow up appointment with care management team member scheduled for: 01-24-2023 at 1000 am     RNCM Care Management  Expected Outcome:  Monitor, Self-Manage, and Reduce Symptoms of Hypertension       Current Barriers:  Knowledge Deficits related to the importance of calling the providers when out of medications or changes in HTN or heart health Care Coordination needs related to pharmacy support and education, referral pending in a patient with HTN and other chronic conditions Chronic Disease Management support and education needs related to effective management of HTN BP Readings from Last 3 Encounters:  12/15/22 128/68  10/20/22 132/68  10/14/22 (!) 110/50     Planned Interventions: Evaluation of current treatment plan related to hypertension self management and patient's adherence to plan as established by provider. Does not regularly check blood pressure stating his blood pressure is always good. RNCM advised to check daily and keep log. Provided education to patient re: stroke prevention, s/s of heart attack and stroke; Reviewed prescribed diet heart healthy diet. Education on the benefits of eating fresh fruits and vegetables.  Denies any acute changes in his dietary habits.  Reviewed medications with patient and  discussed importance of compliance.  Discussed plans with patient for ongoing care management follow up and provided patient with direct contact information for care management team; Advised patient, providing education and rationale, to monitor  blood pressure daily and record, calling PCP for findings outside established parameters;  Reviewed scheduled/upcoming provider appointments including: with PCP on 03-23-2023 Advised patient to discuss changes in his HTN or  heart health with provider; Provided education on prescribed diet heart healthy diet;  Discussed complications of poorly controlled blood pressure such as heart disease, stroke, circulatory complications, vision complications, kidney impairment, sexual dysfunction;  Screening for signs and symptoms of depression related to chronic disease state;  Assessed social determinant of health barriers;   Symptom Management: Take medications as prescribed   Attend all scheduled provider appointments Call provider office for new concerns or questions  call the Suicide and Crisis Lifeline: 988 call the [place] National Suicide Prevention Lifeline: 361-137-6525 or TTY: (641) 355-1245 TTY 3377222565) to talk to a trained counselor call 1-800-273-TALK (toll free, 24 hour hotline) go to [facility] [address], [place] [phone]) if experiencing a Mental Health or Behavioral Health Crisis  check blood pressure weekly learn about high blood pressure call doctor for signs and symptoms of high blood pressure develop an action plan for high blood pressure keep all doctor appointments take medications for blood pressure exactly as prescribed report new symptoms to your doctor  Follow Up Plan: Telephone follow up appointment with care management team member scheduled for: 01-24-2023 at 1000 am      RNCM Care Management Expected Outcome:  Monitor, Self-Manage and Reduce Symptoms of Heart Failure       Current Barriers:  Knowledge Deficits related to the benefits of weighing daily to monitor for excess fluid and to prevent an exacerbation of HF Care Coordination needs related to education and support from pharm D for medication management and  support  in a patient with HF Chronic Disease Management support and education needs related to effective management of HF Wt Readings from Last 3 Encounters:  12/15/22 230 lb (104.3 kg)  10/20/22 221 lb (100.2 kg)  10/14/22 223 lb (101.2 kg)     Planned Interventions: Basic overview and discussion of pathophysiology of Heart Failure reviewed. Patient weighs daily and states his weight today was 222 lbs. He does endorse some swelling in his legs/feet and states he does keep then elevated with 2 pillows when in the recliner. He states when he last saw the PCP she mentioned he needed to be followed by a podiatrist. He is requesting follow up on this. Provided education on low sodium diet. Patient states that he is not monitoring his intake and has been eating a lot of leftovers from Thanksgiving. RNCM provided reminder of maintaining a heart healthy diet. Reviewed Heart Failure Action Plan in depth and provided written copy Assessed need for readable accurate scales in home. Weighs daily Discussed importance of daily weight and advised patient to weigh and record daily.  RNCM provided reminder to patient about notifying his provider when his weight is up 2 lbs in a day or 5 lbs in a week.  Reviewed role of diuretics in prevention of fluid overload and management of heart failure.  Discussed the importance of keeping all appointments with provider Advised patient to discuss changes in fluid balance, changes in HF, questions, or concerns with provider Screening for signs and symptoms of depression related to chronic disease state  Assessed social determinant of health  barriers EF 50 to 55% on 06-10-2021, education provided on what the EF% was and how it impacts his chronic conditions  Symptom Management: Take medications as prescribed   Attend all scheduled provider appointments Call provider office for new concerns or questions  call the Suicide and Crisis Lifeline: 988 call the [place] National  Suicide Prevention Lifeline: 878-747-9995 or TTY: 318-015-8860 TTY 210-757-3042) to talk to a trained counselor call 1-800-273-TALK (toll free, 24 hour hotline) go to [facility] [address] [place] [phone]) if experiencing a Mental Health or Behavioral Health Crisis  call office if I gain more than 2 pounds in one day or 5 pounds in one week use salt in moderation watch for swelling in feet, ankles and legs every day weigh myself daily develop a rescue plan follow rescue plan if symptoms flare-up  Follow Up Plan: Telephone follow up appointment with care management team member scheduled for: 01-24-2023 at 10:00 am       RNCM Care Management:  Maintain, Monitor and Self-Manage Symptoms of COPD       Current Barriers:  Knowledge Deficits related to the importance of following the plan of care and taking medications as directed for effective management of COPD Care Coordination needs related to pharmacy needs  in a patient with COPD Chronic Disease Management support and education needs related to effective management of COPD 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 Patient reports increased shortness of breath, stating it takes him at minimum 5 minutes for his breathing to return to normal. He states that he was checking his oxygen frequently and his last check is resting oxygen saturation was 80-81% and this was prior to Thanksgiving. RNCM advised that patient should go to the ER due to increased work of breathing. Patient refusing to go be evaluated and states he has a pulmonary appointment upcoming on 01-10-2024 and he will address it then. He states that he is recovering from a cold that he has  had for a few weeks. Advised patient to track and manage COPD triggers. Review of monitoring for changes that may exacerbate his COPD. The patient is aware and knows to watch for acute changes.  Provided written and verbal instructions on pursed lip breathing and utilized returned demonstration as teach back Provided instruction about proper use of medications used for management of COPD including inhalers. Reports using albuterol more frequently due to increased work of breathing. RNCM PROVIDED REMINDER: ADVISED PATIENT TO SELF ASSESSES COPD ACTION PLAN ZONE AND MAKE APPOINTMENT WITH PROVIDER IF IN THE YELLOW ZONE FOR 48 HOURS WITHOUT IMPROVEMENT Advised patient to engage in light exercise as tolerated 3-5 days a week to aid in the the management of COPD Provided education about and advised patient to utilize infection prevention strategies to reduce risk of respiratory infection Discussed the importance of adequate rest and management of fatigue with COPD Screening for signs and symptoms of depression related to chronic disease state  Assessed social determinant of health barriers  Symptom Management: Take medications as prescribed   Attend all scheduled provider appointments Call provider office for new concerns or questions  call the Suicide and Crisis Lifeline: 988 call the [place] National Suicide Prevention Lifeline: (564)801-3660 or TTY: 470-355-1318 TTY 985-651-2323) to talk  to a trained counselor call 1-800-273-TALK (toll free, 24 hour hotline) go to [facility] [address], [place] [phone]) if experiencing a Mental Health or Behavioral Health Crisis  identify and remove indoor air pollutants limit outdoor activity during cold weather listen for public air quality announcements every day develop a rescue plan eliminate symptom triggers at home follow rescue plan if symptoms flare-up  Follow Up Plan: Telephone follow up appointment  with care management team member scheduled for: 01-24-2023 at 10:00 am           Consent to Services:  Patient was given information about care management services, agreed to services, and gave verbal consent to participate.   Plan: Telephone follow up appointment with care management team member scheduled for:01-24-2023 at 10:00 am  Danise Edge, BSN RN RN Care Manager  [facility]  Ambulatory Care Management  Direct Number: [phone]

## 2022-12-20 NOTE — Patient Outreach (Signed)
  Care Management   Follow Up Note   12/20/2022 Name: Kyle Farley MRN: [id number] DOB: 1940/04/28   Referred by: Blane Ohara, MD Reason for referral : Care Management (RNCM: Attempt to follow up for chronic disease management & care coordination needs)   An unsuccessful telephone outreach was attempted today. The patient was referred to the case management team for assistance with care management and care coordination.   Follow Up Plan: The care management team will reach out to the patient again over the next 30-60 days.   Danise Edge, BSN RN RN Care Manager    Ambulatory Care Management  Direct Number: [phone]

## 2022-12-21 ENCOUNTER — Other Ambulatory Visit: Payer: Self-pay | Admitting: Family Medicine

## 2022-12-27 ENCOUNTER — Ambulatory Visit: Payer: Self-pay | Admitting: Family Medicine

## 2022-12-27 NOTE — Telephone Encounter (Signed)
  Chief Complaint: SOB, spo2-82% Symptoms: SOB, lightheaded, dizzy   Disposition: [x] ED /[] Urgent Care (no appt availability in office) / [] Appointment(In office/virtual)/ []  [facility]/ [] Home Care/ [] Refused Recommended Disposition /[] [facility]/ []  Follow-up with PCP Additional Notes: Sent to ED immediately, advised to call 911. Pt son states SPO2 normally 91-94%, currently pt spo2 82% with SOB and dizziness. Pt son agreeable to pt going to ED.  Copied from CRM [id number]. Topic: Clinical - Red Word Triage >> Dec 27, 2022  4:21 PM Fuller Mandril wrote: Red Word that prompted transfer to Nurse Triage: Dizziness, Lightheadedness, having trouble breathing oxygen level 82 Reason for Disposition . Sounds like a life-threatening emergency to the triager  Answer Assessment - Initial Assessment Questions 1. RESPIRATORY STATUS: "Describe your breathing?" (e.g., wheezing, shortness of breath, unable to speak, severe coughing)      SOB O2 SATURATION MONITOR:  "Do you use an oxygen saturation monitor (pulse oximeter) at home?" If Yes, ask: "What is your reading (oxygen level) today?" "What is your usual oxygen saturation reading?" (e.g., 95%)       82% currently, normally 91-94%  Protocols used: Breathing Difficulty-A-AH

## 2022-12-28 ENCOUNTER — Ambulatory Visit: Payer: Medicare HMO | Admitting: Podiatry

## 2022-12-28 ENCOUNTER — Telehealth: Payer: Self-pay | Admitting: Cardiology

## 2022-12-28 DIAGNOSIS — R918 Other nonspecific abnormal finding of lung field: Secondary | ICD-10-CM | POA: Diagnosis not present

## 2022-12-28 DIAGNOSIS — J9620 Acute and chronic respiratory failure, unspecified whether with hypoxia or hypercapnia: Secondary | ICD-10-CM | POA: Diagnosis not present

## 2022-12-28 DIAGNOSIS — R079 Chest pain, unspecified: Secondary | ICD-10-CM | POA: Diagnosis not present

## 2022-12-28 DIAGNOSIS — I714 Abdominal aortic aneurysm, without rupture, unspecified: Secondary | ICD-10-CM | POA: Diagnosis not present

## 2022-12-28 DIAGNOSIS — I4510 Unspecified right bundle-branch block: Secondary | ICD-10-CM | POA: Diagnosis not present

## 2022-12-28 DIAGNOSIS — I10 Essential (primary) hypertension: Secondary | ICD-10-CM | POA: Diagnosis not present

## 2022-12-28 DIAGNOSIS — I5033 Acute on chronic diastolic (congestive) heart failure: Secondary | ICD-10-CM | POA: Diagnosis not present

## 2022-12-28 DIAGNOSIS — R9431 Abnormal electrocardiogram [ECG] [EKG]: Secondary | ICD-10-CM | POA: Diagnosis not present

## 2022-12-28 DIAGNOSIS — I200 Unstable angina: Secondary | ICD-10-CM | POA: Diagnosis not present

## 2022-12-28 DIAGNOSIS — I350 Nonrheumatic aortic (valve) stenosis: Secondary | ICD-10-CM | POA: Diagnosis not present

## 2022-12-28 DIAGNOSIS — J449 Chronic obstructive pulmonary disease, unspecified: Secondary | ICD-10-CM | POA: Diagnosis not present

## 2022-12-28 DIAGNOSIS — I509 Heart failure, unspecified: Secondary | ICD-10-CM | POA: Diagnosis not present

## 2022-12-28 NOTE — Telephone Encounter (Signed)
Dee with [facility] called in asking if pt should still be taking metoprolol. Please advise.

## 2022-12-28 NOTE — Telephone Encounter (Signed)
Returned call to [place] in the Pharmacy at [facility]. She was trying to clarify the pts dose of Metoprolol Succinate. Our med list says 50mg  tablet take 12.5mg  daily

## 2022-12-29 DIAGNOSIS — I350 Nonrheumatic aortic (valve) stenosis: Secondary | ICD-10-CM | POA: Diagnosis not present

## 2022-12-29 DIAGNOSIS — J9620 Acute and chronic respiratory failure, unspecified whether with hypoxia or hypercapnia: Secondary | ICD-10-CM | POA: Diagnosis not present

## 2022-12-29 DIAGNOSIS — I5033 Acute on chronic diastolic (congestive) heart failure: Secondary | ICD-10-CM | POA: Diagnosis not present

## 2022-12-30 DIAGNOSIS — I5033 Acute on chronic diastolic (congestive) heart failure: Secondary | ICD-10-CM | POA: Diagnosis not present

## 2022-12-30 DIAGNOSIS — I350 Nonrheumatic aortic (valve) stenosis: Secondary | ICD-10-CM | POA: Diagnosis not present

## 2022-12-30 DIAGNOSIS — R079 Chest pain, unspecified: Secondary | ICD-10-CM | POA: Diagnosis not present

## 2022-12-30 DIAGNOSIS — J9620 Acute and chronic respiratory failure, unspecified whether with hypoxia or hypercapnia: Secondary | ICD-10-CM | POA: Diagnosis not present

## 2023-01-06 ENCOUNTER — Other Ambulatory Visit: Payer: Self-pay

## 2023-01-06 NOTE — Telephone Encounter (Signed)
Pt pharmacy [facility] is requesting a refill on Metoprolol 50 mg tablets, pt taking 12.5 mg tablets, which is 1/4 of the 50 mg. Would Wallis Bamberg, NP like to reorder metoprolol as 25 mg tablets, pt taking 1/2 tablet daily? Please address

## 2023-01-10 ENCOUNTER — Encounter: Payer: Self-pay | Admitting: Pulmonary Disease

## 2023-01-10 ENCOUNTER — Ambulatory Visit: Payer: Medicare HMO | Admitting: Pulmonary Disease

## 2023-01-10 VITALS — BP 104/59 | HR 61 | Temp 97.4°F | Ht 67.0 in | Wt 229.2 lb

## 2023-01-10 DIAGNOSIS — G4734 Idiopathic sleep related nonobstructive alveolar hypoventilation: Secondary | ICD-10-CM | POA: Diagnosis not present

## 2023-01-10 DIAGNOSIS — J449 Chronic obstructive pulmonary disease, unspecified: Secondary | ICD-10-CM | POA: Diagnosis not present

## 2023-01-10 DIAGNOSIS — F17210 Nicotine dependence, cigarettes, uncomplicated: Secondary | ICD-10-CM

## 2023-01-10 MED ORDER — ALBUTEROL SULFATE (2.5 MG/3ML) 0.083% IN NEBU: 2.5000 mg | INHALATION_SOLUTION | Freq: Four times a day (QID) | RESPIRATORY_TRACT | 12 refills | Status: DC | PRN

## 2023-01-10 NOTE — Progress Notes (Signed)
Synopsis: Referred in February 2024 for COPD by Lurline Del, FNP  Subjective:   PATIENT ID: Kyle Farley GENDER: male DOB: [birth year], MRN: [id number]  HPI  Chief Complaint  Patient presents with   Follow-up   Kyle Farley is an 82 year old male, daily smoker with history of GERD, hypertension, paroxysmal atrial fibrillation, COPD with nocturnal hypoxemia who returns to pulmonary clinic for COPD.   Initial OV 03/10/22 He reports progressive shortness of breath over the past 4-5 years. He is using trelegy 100, 1 puff daily and as needed albuterol inhaler or nebulizer treatments. He has significant dyspnea with any exertional activity. He uses a walker to ambulate. He is accompanied by his son. He complains of chest tightness or discomfort when he breathing gets bad. He has intermittent wheezing.   He is smoking 1-2 packs per day, he has been smoking for 68 years. He retired 10 years ago and lives with his son. He is not interested in quitting smoking.   OV 08/18/22 He complains of significant shortness of breath. He is using albuterol frequently. He is using trelegy daily but does not feel he is able to get a good breath in to get the medicine into his lungs.   PFTs today show FEV 1 1.29L (51%) FVC 2.76L (77%) DLCO 47%  He continues to smoke 1.5-2 packs per day.   Today 01/10/23 He has been stable since last visit. Using trelegy daily. He did not notice benefit from budesonide/brovana/yupelri nebs plus they were more expensive. Continue with dyspnea, cough and sputum production. Smoking 2 packs per day.    Past Medical History:  Diagnosis Date   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 (HCC) 11/10/2021   Acquired hypothyroidism 11/10/2021   Acute hypoxic respiratory failure (HCC) 04/25/2022   Acute on chronic diastolic heart failure (HCC) 04/25/2022   Acute on chronic systolic heart failure (HCC) 11/10/2021   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/10/2021    Alcohol use 09/12/2020   Anemia of chronic disease 04/25/2022   Arthritis    Atherosclerosis of native arteries of extremities with intermittent claudication, bilateral legs (HCC) 11/10/2021   Bilateral leg edema 02/16/2022   BPH (benign prostatic hyperplasia) 11/10/2021   CHF (congestive heart failure) (HCC)    Chronic hyponatremia 04/25/2022   Chronic respiratory failure with hypoxia (HCC) 05/09/2022   Cigarette smoker 10/21/2014   Class 1 obesity 04/25/2022   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)    Dyspnea    Dysrhythmia    atrial fibrillation   Encounter for prostate cancer screening 02/16/2022   Encounter for screening for lung cancer 02/16/2022   Essential hypertension 10/21/2014   Femur fracture, right (HCC) 09/12/2020   GERD (gastroesophageal reflux disease)    History of kidney stones    Hyperlipidemia 01/24/2017   Hypertension    Hyponatremia 04/26/2022   Kidney stones 01/24/2017   Paroxysmal atrial fibrillation (HCC) 10/21/2014   Pressure injury of skin 09/16/2020   SOB (shortness of breath) 05/10/2022   Tobacco abuse 02/16/2022     Family History  Problem Relation Age of Onset   Cancer Mother    Diabetes Mother    Breast cancer Sister      Social History   Socioeconomic History   Marital status: Widowed    Spouse name: Not on file   Number of children: 3   Years of education: Not on file   Highest education level: High school graduate  Occupational History   Occupation:  retired  Tobacco Use   Smoking status: Every Day    Current packs/day: 1.00    Average packs/day: 1 pack/day for 62.0 years (62.0 ttl pk-yrs)    Types: Cigarettes, Cigars   Smokeless tobacco: Never  Vaping Use   Vaping status: Never Used  Substance and Sexual Activity   Alcohol use: Yes    Alcohol/week: 5.0 standard drinks of alcohol    Types: 5 Standard drinks or equivalent per week    Comment: 4-5 beers per day   Drug use: No   Sexual activity: Not Currently   Other Topics Concern   Not on file  Social History Narrative   Not on file   Social Drivers of Health   Financial Resource Strain: Low Risk  (04/26/2022)   Overall Financial Resource Strain (CARDIA)    Difficulty of Paying Living Expenses: Not very hard  Food Insecurity: No Food Insecurity (04/15/2022)   Hunger Vital Sign    Worried About Running Out of Food in the Last Year: Never true    Ran Out of Food in the Last Year: Never true  Transportation Needs: No Transportation Needs (04/26/2022)   PRAPARE - Administrator, Civil Service (Medical): No    Lack of Transportation (Non-Medical): No  Physical Activity: Inactive (04/15/2022)   Exercise Vital Sign    Days of Exercise per Week: 0 days    Minutes of Exercise per Session: 0 min  Stress: No Stress Concern Present (04/15/2022)   [employer] of Occupational Health - Occupational Stress Questionnaire    Feeling of Stress : Not at all  Social Connections: Moderately Isolated (04/15/2022)   Social Connection and Isolation Panel [NHANES]    Frequency of Communication with Friends and Family: More than three times a week    Frequency of Social Gatherings with Friends and Family: More than three times a week    Attends Religious Services: More than 4 times per year    Active Member of [employer] or Organizations: No    Attends Banker Meetings: Never    Marital Status: Widowed  Intimate Partner Violence: Not At Risk (04/15/2022)   Humiliation, Afraid, Rape, and Kick questionnaire    Fear of Current or Ex-Partner: No    Emotionally Abused: No    Physically Abused: No    Sexually Abused: No     No Known Allergies    Outpatient Medications Prior to Visit  Medication Sig Dispense Refill   albuterol (VENTOLIN HFA) 108 (90 Base) MCG/ACT inhaler Inhale 2 puffs into the lungs every 4 (four) hours as needed for wheezing or shortness of breath. 48 g 3   amiodarone (PACERONE) 200 MG tablet Take 1 tablet (200 mg total)  by mouth daily.     amLODipine (NORVASC) 5 MG tablet Take 5 mg by mouth daily.     apixaban (ELIQUIS) 5 MG TABS tablet Take 1 tablet (5 mg total) by mouth 2 (two) times daily. 180 tablet 3   atorvastatin (LIPITOR) 40 MG tablet Take 1 tablet (40 mg total) by mouth daily. 90 tablet 3   benzonatate (TESSALON) 100 MG capsule Take 2 capsules (200 mg total) by mouth 3 (three) times daily as needed for cough. 30 capsule 0   cetirizine (ZYRTEC) 10 MG tablet Take 10 mg by mouth at bedtime.     diphenhydrAMINE HCl, Sleep, (ZZZQUIL PO) Take 1 tablet by mouth at bedtime.     EQ MUCUS RELIEF 12 HOUR MAX ST 1200 MG TB12  Take 1 tablet (1,200 mg total) by mouth 2 (two) times daily. 60 tablet 0   FARXIGA 10 MG TABS tablet Take 1 tablet by mouth once daily 30 tablet 2   levothyroxine (SYNTHROID) 50 MCG tablet Take 50 mcg by mouth daily before breakfast.     metoprolol succinate (TOPROL-XL) 50 MG 24 hr tablet Take 12.5 mg by mouth daily. Take with or immediately following a meal.     montelukast (SINGULAIR) 10 MG tablet Take 1 tablet (10 mg total) by mouth daily at 12 noon. 90 tablet 3   nitroGLYCERIN (NITROSTAT) 0.4 MG SL tablet DISSOLVE ONE TABLET UNDER THE TONGUE EVERY 5 MINUTES AS NEEDED FOR CHEST PAIN.  DO NOT EXCEED A TOTAL OF 3 DOSES IN 15 MINUTES 50 tablet 0   OXYGEN Inhale 3 L into the lungs as needed (shortness of breath).     pantoprazole (PROTONIX) 40 MG tablet Take 1 tablet (40 mg total) by mouth daily. 90 tablet 3   spironolactone (ALDACTONE) 25 MG tablet Take 1 tablet by mouth once daily 90 tablet 0   tamsulosin (FLOMAX) 0.4 MG CAPS capsule Take 1 capsule by mouth once daily 90 capsule 0   thiamine (VITAMIN B1) 100 MG tablet Take 100 mg by mouth daily.     torsemide (DEMADEX) 20 MG tablet Take 1 tablet by mouth twice daily as needed 180 tablet 0   TRELEGY ELLIPTA 200-62.5-25 MCG/ACT AEPB Inhale 1 puff into the lungs daily.     No facility-administered medications prior to visit.   Review of  Systems  Constitutional:  Negative for chills, fever, malaise/fatigue and weight loss.  HENT:  Negative for congestion, sinus pain and sore throat.   Eyes: Negative.   Respiratory:  Positive for cough, sputum production and shortness of breath. Negative for hemoptysis and wheezing.   Cardiovascular:  Negative for chest pain, palpitations, orthopnea, claudication and leg swelling.  Gastrointestinal:  Negative for abdominal pain, heartburn, nausea and vomiting.  Genitourinary: Negative.   Musculoskeletal:  Negative for joint pain and myalgias.  Skin:  Negative for rash.  Neurological:  Negative for weakness.  Endo/Heme/Allergies: Negative.   Psychiatric/Behavioral: Negative.     Objective:   Vitals:   01/10/23 0940  BP: (!) 104/59  Pulse: 61  Temp: (!) 97.4 F (36.3 C)  TempSrc: Oral  SpO2: 93%  Weight: 229 lb 3.2 oz (104 kg)  Height: 5\' 7"  (1.702 m)    Physical Exam Constitutional:      General: He is not in acute distress. HENT:     Head: Normocephalic and atraumatic.  Eyes:     Conjunctiva/sclera: Conjunctivae normal.  Cardiovascular:     Rate and Rhythm: Normal rate and regular rhythm.     Pulses: Normal pulses.     Heart sounds: Normal heart sounds. No murmur heard. Pulmonary:     Breath sounds: Wheezing (RLL) present. No rhonchi.  Musculoskeletal:     Right lower leg: No edema.     Left lower leg: No edema.  Skin:    General: Skin is warm and dry.  Neurological:     General: No focal deficit present.     Mental Status: He is alert.    CBC    Component Value Date/Time   WBC 10.7 12/15/2022 1030   WBC 10.3 06/12/2022 0251   RBC 4.63 12/15/2022 1030   RBC 4.04 08/12/2022 0000   HGB 13.8 12/15/2022 1030   HCT 42.7 12/15/2022 1030   PLT 223 12/15/2022 1030  MCV 92 12/15/2022 1030   MCH 29.8 12/15/2022 1030   MCH 27.1 06/12/2022 0251   MCHC 32.3 12/15/2022 1030   MCHC 32.2 06/12/2022 0251   RDW 14.8 12/15/2022 1030   LYMPHSABS 1.7 12/15/2022 1030    MONOABS 0.2 04/25/2022 0638   EOSABS 0.1 12/15/2022 1030   BASOSABS 0.1 12/15/2022 1030      Latest Ref Rng & Units 12/15/2022   10:30 AM 08/25/2022   10:35 AM 08/12/2022   12:00 AM  BMP  Glucose 70 - 99 mg/dL 161  096    BUN 8 - 27 mg/dL 9  14  28       Creatinine 0.76 - 1.27 mg/dL 0.45  4.09  1.1      BUN/Creat Ratio 10 - 24 8  12     Sodium 134 - 144 mmol/L 137  138  135      Potassium 3.5 - 5.2 mmol/L 4.2  4.4  5.4      Chloride 96 - 106 mmol/L 96  95  102      CO2 20 - 29 mmol/L 24  27  23       Calcium 8.6 - 10.2 mg/dL 9.8  9.4  8.9         This result is from an external source.   Chest imaging:  PFT:    Latest Ref Rng & Units 08/18/2022   10:35 AM  PFT Results  FVC-Pre L 2.76   FVC-Predicted Pre % 77   Pre FEV1/FVC % % 47   FEV1-Pre L 1.29   FEV1-Predicted Pre % 51   DLCO uncorrected ml/min/mmHg 10.57   DLCO UNC% % 47   DLVA Predicted % 65   TLC L 8.77   TLC % Predicted % 133   RV % Predicted % 268     Labs:  Path:  Echo:  Heart Catheterization:  Assessment & Plan:   Chronic obstructive pulmonary disease, unspecified COPD type (HCC) - Plan: albuterol ([name]) (2.5 MG/3ML) 0.083% nebulizer solution  Nocturnal hypoxemia  Cigarette smoker  Discussion: Kyle Farley is an 82 year old male, daily smoker with history of GERD, hypertension, paroxysmal atrial fibrillation, COPD with nocturnal hypoxemia who returns to pulmonary clinic for COPD.   He has severe COPD and functional limitations. He did not benefit from all long acting neb regimen and is back on trelegy 200 1 puff daily and as needed albuterol. Neb prescription refilled today.   Needs to quit smoking but not motivated at this time.  Follow up in 6 months.  Melody Comas, MD [facility] Office: [phone]    Current Outpatient Medications:    albuterol ([name]) (2.5 MG/3ML) 0.083% nebulizer solution, Take 3 mLs (2.5 mg total) by nebulization every 6 (six)  hours as needed for wheezing or shortness of breath., Disp: 360 mL, Rfl: 12   albuterol (VENTOLIN HFA) 108 (90 Base) MCG/ACT inhaler, Inhale 2 puffs into the lungs every 4 (four) hours as needed for wheezing or shortness of breath., Disp: 48 g, Rfl: 3   amiodarone (PACERONE) 200 MG tablet, Take 1 tablet (200 mg total) by mouth daily., Disp: , Rfl:    amLODipine (NORVASC) 5 MG tablet, Take 5 mg by mouth daily., Disp: , Rfl:    apixaban (ELIQUIS) 5 MG TABS tablet, Take 1 tablet (5 mg total) by mouth 2 (two) times daily., Disp: 180 tablet, Rfl: 3   atorvastatin (LIPITOR) 40 MG tablet, Take 1 tablet (40 mg total) by mouth  daily., Disp: 90 tablet, Rfl: 3   benzonatate (TESSALON) 100 MG capsule, Take 2 capsules (200 mg total) by mouth 3 (three) times daily as needed for cough., Disp: 30 capsule, Rfl: 0   cetirizine (ZYRTEC) 10 MG tablet, Take 10 mg by mouth at bedtime., Disp: , Rfl:    diphenhydrAMINE HCl, Sleep, (ZZZQUIL PO), Take 1 tablet by mouth at bedtime., Disp: , Rfl:    EQ MUCUS RELIEF 12 HOUR MAX ST 1200 MG TB12, Take 1 tablet (1,200 mg total) by mouth 2 (two) times daily., Disp: 60 tablet, Rfl: 0   FARXIGA 10 MG TABS tablet, Take 1 tablet by mouth once daily, Disp: 30 tablet, Rfl: 2   levothyroxine (SYNTHROID) 50 MCG tablet, Take 50 mcg by mouth daily before breakfast., Disp: , Rfl:    metoprolol succinate (TOPROL-XL) 50 MG 24 hr tablet, Take 12.5 mg by mouth daily. Take with or immediately following a meal., Disp: , Rfl:    montelukast (SINGULAIR) 10 MG tablet, Take 1 tablet (10 mg total) by mouth daily at 12 noon., Disp: 90 tablet, Rfl: 3   nitroGLYCERIN (NITROSTAT) 0.4 MG SL tablet, DISSOLVE ONE TABLET UNDER THE TONGUE EVERY 5 MINUTES AS NEEDED FOR CHEST PAIN.  DO NOT EXCEED A TOTAL OF 3 DOSES IN 15 MINUTES, Disp: 50 tablet, Rfl: 0   OXYGEN, Inhale 3 L into the lungs as needed (shortness of breath)., Disp: , Rfl:    pantoprazole (PROTONIX) 40 MG tablet, Take 1 tablet (40 mg total) by mouth  daily., Disp: 90 tablet, Rfl: 3   spironolactone (ALDACTONE) 25 MG tablet, Take 1 tablet by mouth once daily, Disp: 90 tablet, Rfl: 0   tamsulosin (FLOMAX) 0.4 MG CAPS capsule, Take 1 capsule by mouth once daily, Disp: 90 capsule, Rfl: 0   thiamine (VITAMIN B1) 100 MG tablet, Take 100 mg by mouth daily., Disp: , Rfl:    torsemide (DEMADEX) 20 MG tablet, Take 1 tablet by mouth twice daily as needed, Disp: 180 tablet, Rfl: 0   TRELEGY ELLIPTA 200-62.5-25 MCG/ACT AEPB, Inhale 1 puff into the lungs daily., Disp: , Rfl:

## 2023-01-10 NOTE — Patient Instructions (Addendum)
Continue trelegy ellipta 1 puff daily - rinse mouth out after each use  Continue albuterol inhaler or nebulizer treatments as needed every 4-6 hours as needed  Consider quitting smoking, let us know if you are ready and we can discuss further plans  Follow up in 6 months, call sooner if needed

## 2023-01-16 DIAGNOSIS — J449 Chronic obstructive pulmonary disease, unspecified: Secondary | ICD-10-CM | POA: Diagnosis not present

## 2023-01-17 ENCOUNTER — Ambulatory Visit: Payer: Medicare HMO | Admitting: Podiatry

## 2023-01-17 ENCOUNTER — Encounter: Payer: Self-pay | Admitting: Podiatry

## 2023-01-17 DIAGNOSIS — M79675 Pain in left toe(s): Secondary | ICD-10-CM | POA: Diagnosis not present

## 2023-01-17 DIAGNOSIS — R600 Localized edema: Secondary | ICD-10-CM

## 2023-01-17 DIAGNOSIS — E1142 Type 2 diabetes mellitus with diabetic polyneuropathy: Secondary | ICD-10-CM

## 2023-01-17 DIAGNOSIS — B351 Tinea unguium: Secondary | ICD-10-CM | POA: Diagnosis not present

## 2023-01-17 DIAGNOSIS — M79674 Pain in right toe(s): Secondary | ICD-10-CM | POA: Diagnosis not present

## 2023-01-17 MED ORDER — GABAPENTIN 100 MG PO CAPS: 100.0000 mg | ORAL_CAPSULE | Freq: Three times a day (TID) | ORAL | 3 refills | Status: DC

## 2023-01-17 NOTE — Progress Notes (Signed)
  Subjective:  Patient ID: Kyle Farley, male    DOB: 1940-04-27,  MRN: [id number]  Chief Complaint  Patient presents with   Diabetes    Pt referred by pcp, need to discuss diabetic shoes and foot care. Feet and ankles are swollen, showing signs of neuropathy. A1c 6.4 eliquis    82 y.o. male presents with the above complaint. History confirmed with patient. Patient presenting with pain related to dystrophic thickened elongated nails. Patient is unable to trim own nails related to nail dystrophy and/or mobility issues. Patient does have a history of T2DM.  He does endorse numbness and tingling to both feet.  Reports last A1c 6.4.  He is on Eliquis chronically.  Does have history of heart failure.  He does endorse shortness of breath today and ambulates using a rollator.  He denies any nausea, vomiting, fever, chills, chest pain.  Objective:  Physical Exam: warm, cap refill approximately 3 seconds to the digits, pedal hair growth absent.  Pedal skin atrophic, xerotic.  Webspaces clean and dry nail exam onychomycosis of the toenails, onycholysis, and dystrophic nails DP pulses palpable, protective sensation absent, and vibratory sensation absent, PT pulses faintly palpable. +2 pitting edema. Left Foot:  Pain with palpation of nails due to elongation and dystrophic growth.  Right Foot: Pain with palpation of nails due to elongation and dystrophic growth.   Assessment:   1. Pain due to onychomycosis of toenails of both feet   2. Diabetic polyneuropathy associated with type 2 diabetes mellitus (HCC)   3. Bilateral leg edema      Plan:  Patient was evaluated and treated and all questions answered.  #Onychomycosis with pain  -Nails palliatively debrided as below. -Educated on self-care  Procedure: Nail Debridement Rationale: Pain Type of Debridement: manual, sharp debridement. Instrumentation: Nail nipper, rotary burr. Number of Nails: 10  # Diabetes with neuropathy -Patient  educated on diabetes. Discussed proper diabetic foot care and discussed risks and complications of disease. Educated patient in depth on reasons to return to the office immediately should he/she discover anything concerning or new on the feet. All questions answered. Discussed proper shoes as well.  -Patient would benefit from diabetic shoes with custom inserts x 3 due to extent of neuropathy, atrophic skin, diminished PT pulses with absent pedal hair growth. -Starting patient on gabapentin 100 mg 3 times daily.  Discussed that he can try taking medication at night if he notices some side effects and she will discontinue the medication if this persist.  I would like to see him back in a month to assess how he has been tolerating the medication. -Did discuss potential topical treatments as well.  # Lower extremity edema -Advised the patient that if he experiences any worsening shortness of breath he needs to go to the hospital immediately. States that he did recently need to go to the hospital for fluid overload. -Chronic in nature secondary to heart failure.  Advised patient to discuss use of lymphedema pumps with his cardiologist. -Believe that patient would be a poor candidate for compression stockings due to heart failure and mobility issues.   Return in about 4 weeks (around 02/14/2023) for Diabetic neuropathy.         Bronwen Betters, DPM [facility] / [facility]

## 2023-01-21 ENCOUNTER — Other Ambulatory Visit: Payer: Self-pay | Admitting: Family Medicine

## 2023-01-24 ENCOUNTER — Other Ambulatory Visit: Payer: Self-pay | Admitting: *Deleted

## 2023-01-24 DIAGNOSIS — R079 Chest pain, unspecified: Secondary | ICD-10-CM | POA: Diagnosis not present

## 2023-01-24 NOTE — Patient Instructions (Signed)
 Visit Information  Thank you for taking time to visit with me today. Please don't hesitate to contact me if I can be of assistance to you before our next scheduled telephone appointment.  Following are the goals we discussed today:   Goals Addressed             This Visit's Progress    RNCM Care Management  Expected Outcome:  Monitor, Self-Manage and Reduce Symptoms of Afib       Current Barriers:  Chronic Disease Management support and education needs related to effective management of AFIB  Planned Interventions: Provider order and care plan reviewed.  Denies any changes Counseled on increased risk of stroke due to Afib and benefits of anticoagulation for stroke prevention           Reviewed importance of adherence to anticoagulant exactly as prescribed. Reports compliance with Eliquis  Advised patient to discuss changes in AFIB, questions or concerns with provider Counseled on bleeding risk associated with AFIB and importance of self-monitoring for signs/symptoms of bleeding Counseled on avoidance of NSAIDs due to increased bleeding risk with anticoagulants Counseled on importance of regular laboratory monitoring as prescribed. Has labs on a regular basis.  Counseled on seeking medical attention after a head injury or if there is blood in the urine/stool. Review on being safe and monitoring for acute changes Afib action plan reviewed Screening for signs and symptoms of depression related to chronic disease state Assessed social determinant of health barriers  Symptom Management: Take medications as prescribed   Attend all scheduled provider appointments Call provider office for new concerns or questions  call the Suicide and Crisis Lifeline: 988 call the [place]  National Suicide Prevention Lifeline: (680)579-1951 or TTY: 406 778 5090 TTY (402)187-5679) to talk to a trained counselor call 1-800-273-TALK (toll free, 24 hour hotline) go to [facility] [address] [place] [phone]) if experiencing a Mental Health or Behavioral Health Crisis  - make a plan to eat healthy - keep all lab appointments - take medicine as prescribed  Follow Up Plan: Telephone follow up appointment with care management team member scheduled for: 03-24-2023 at 1000 am     RNCM Care Management  Expected Outcome:  Monitor, Self-Manage, and Reduce Symptoms of Hypertension       Current Barriers:  Knowledge Deficits related to the importance of calling the providers when out of medications or changes in HTN or heart health Care Coordination needs related to pharmacy support and education, referral pending in a patient with HTN and other chronic conditions Chronic Disease Management support and education needs related to effective management of HTN BP Readings from Last 3 Encounters:  01/10/23 (!) 104/59  12/15/22 128/68  10/20/22 132/68     Planned Interventions: Evaluation of current treatment plan related to hypertension self management and patient's adherence to plan as established by provider.  Provided education to patient re: stroke prevention, s/s of heart attack and stroke; Reviewed prescribed diet heart healthy diet.  Reviewed medications with patient and discussed importance of compliance.  Discussed plans with patient for ongoing care management follow up and provided patient with direct contact information for care management team; Advised patient, providing education and rationale, to monitor blood pressure daily and record, calling PCP for findings outside established parameters; Continues to not monitor BP at home, reports normal BP at his prior office visit. RNCM provided education. Reviewed scheduled/upcoming provider appointments including: with PCP on 03-23-2023 Advised patient to discuss changes in his HTN or  heart health with provider; Provided education on prescribed diet heart healthy diet;  Discussed complications of poorly  controlled blood pressure such as heart disease, stroke, circulatory complications, vision complications, kidney impairment, sexual dysfunction;  Screening for signs and symptoms of depression related to chronic disease state;  Assessed social determinant of health barriers;   Symptom Management: Take medications as prescribed   Attend all scheduled provider appointments Call provider office for new concerns or questions  call the Suicide and Crisis Lifeline: 988 call the [place]  National Suicide Prevention Lifeline: (437) 691-6433 or TTY: 936-363-9516 TTY 906 487 3664) to talk to a trained counselor call 1-800-273-TALK (toll free, 24 hour hotline) go to [facility] [address], [place] [phone]) if experiencing a Mental Health or Behavioral Health Crisis  check blood pressure weekly learn about high blood pressure call doctor for signs and symptoms of high blood pressure develop an action plan for high blood pressure keep all doctor appointments take medications for blood pressure exactly as prescribed report new symptoms to your doctor  Follow Up Plan: Telephone follow up appointment with care management team member scheduled for: 03-24-2023 at 1000 am      RNCM Care Management Expected Outcome:  Monitor, Self-Manage and Reduce Symptoms of Heart Failure       Current Barriers:  Knowledge Deficits related to the benefits of weighing daily to monitor for excess fluid and to prevent an exacerbation of HF Care Coordination needs related to education and support from pharm D for medication management and support  in a patient with HF Chronic Disease Management support and education needs related to effective management of HF Wt Readings from Last 3 Encounters:  01/10/23 229 lb 3.2 oz (104 kg)  12/15/22 230 lb (104.3 kg)  10/20/22 221 lb (100.2 kg)     Planned Interventions: Basic overview and discussion of pathophysiology of Heart Failure  reviewed. Patient states has stopped weighing daily and will weigh on occasion. He states he does have swelling in his legs/feet but reports compliance with medication regime. Reports sleeping in recliner due to his breathing. RNCM reviewed when to call provider; weight gain of >2 lbs in a day or 5 lbs in a week. Patient states he use to follow that rule but when he would call, the office did not intervene so he stopped keeping track of it. Provided education on low sodium diet.  Reviewed Heart Failure Action Plan in depth and provided written copy Assessed need for readable accurate scales in home. Recently stopped weighing daily. RNCM provided education on the need to continue daily weighing and when to notify provider. Discussed importance of daily weight and advised patient to weigh and record daily.  RNCM provided reminder to patient about notifying his provider when his weight is up 2 lbs in a day or 5 lbs in a week.  Reviewed role of diuretics in prevention of fluid overload and management of heart failure.  Discussed the importance of keeping all appointments with provider Advised patient to discuss changes in fluid balance, changes in HF, questions, or concerns with provider Screening for signs and symptoms of depression related to chronic disease state  Assessed social determinant of health barriers   Symptom Management: Take medications as prescribed   Attend all scheduled provider appointments Call provider office for new concerns or questions  call the Suicide and Crisis Lifeline: 988 call the [place]  National Suicide Prevention Lifeline: 845-509-5498 or TTY: 279-308-0501 TTY 347-329-6404) to talk to a trained counselor call 1-800-273-TALK (  toll free, 24 hour hotline) go to [facility] [address] [place] [phone]) if experiencing a Mental Health or Behavioral Health Crisis  call office if I gain more than 2 pounds in one day or 5  pounds in one week use salt in moderation watch for swelling in feet, ankles and legs every day weigh myself daily develop a rescue plan follow rescue plan if symptoms flare-up  Follow Up Plan: Telephone follow up appointment with care management team member scheduled for: 03-24-2023 at 10:00 am       RNCM Care Management:  Maintain, Monitor and Self-Manage Symptoms of COPD       Current Barriers:  Knowledge Deficits related to the importance of following the plan of care and taking medications as directed for effective management of COPD Care Coordination needs related to pharmacy needs  in a patient with COPD Chronic Disease Management support and education needs related to effective management of COPD Current every day smoker of 2 PPD  Uses oxygen  3 liters at night, sleeps in a recliner due to inability to lay down because of shortness of breath  Planned Interventions: Provided patient with basic written and verbal COPD education on self care/management/and exacerbation prevention. Patient noted to be dyspneic during outreach and patient states he had just walked to the bathroom. He reports low FiO2 on previous days but has not checked this morning. He reports that he has tried to use his oxygen  during the day and reports that did not help him recover as fast as it should. He states a few days ago, his breathing was doing really well and he was able to get outside more. RNCM advised to reduce in home air pollutants as this may  be a contributing factor. He reports smoking 2 packs her day, and cats living in the home. He is considering obtaining a humidifier as he feels like this may help him. RNCM reiterated to patient that if he was having difficulty breathing he needed to report to the ED. Patient  continued to refuse stating I was just in the hospital at the beginning of January. Advised patient to track and manage COPD triggers. Review of monitoring for changes that may exacerbate his COPD.  The patient is aware and knows to watch for acute changes.  Provided written and verbal instructions on pursed lip breathing and utilized returned demonstration as teach back Provided instruction about proper use of medications used for management of COPD including inhalers.  RNCM PROVIDED REMINDER: ADVISED PATIENT TO SELF ASSESSES COPD ACTION PLAN ZONE AND MAKE APPOINTMENT WITH PROVIDER IF IN THE YELLOW ZONE FOR 48 HOURS WITHOUT IMPROVEMENT Advised patient to engage in light exercise as tolerated 3-5 days a week to aid in the the management of COPD Provided education about and advised patient to utilize infection prevention strategies to reduce risk of respiratory infection Discussed the importance of adequate rest and management of fatigue with COPD Screening for signs and symptoms of depression related to chronic disease state  Assessed social determinant of health barriers  Symptom Management: Take medications as prescribed   Attend all scheduled provider appointments Call provider office for new concerns or questions  call the Suicide and Crisis Lifeline: 988 call the [place]  National Suicide Prevention Lifeline: (941)460-4691 or TTY: (316)670-9331 TTY 916 689 5342) to talk to a trained counselor call 1-800-273-TALK (toll free, 24 hour hotline) go to [facility] [address], [place] [phone]) if experiencing a Mental Health or  Behavioral Health Crisis  identify and remove indoor air pollutants limit outdoor activity during cold weather listen for public air quality announcements every day develop a rescue plan eliminate symptom triggers at home follow rescue plan if symptoms flare-up  Follow Up Plan: Telephone follow up appointment with care management team member scheduled for: 03-24-2023 at 10:00 am           Our next appointment is by telephone on 03-24-2023 at 10:00 am  Please call the care guide team at [phone] if you need to  cancel or reschedule your appointment.   If you are experiencing a Mental Health or Behavioral Health Crisis or need someone to talk to, please call the Suicide and Crisis Lifeline: 988 call the [place]  National Suicide Prevention Lifeline: 409-717-9186 or TTY: (256) 765-6392 TTY (305) 488-2656) to talk to a trained counselor call 1-800-273-TALK (toll free, 24 hour hotline) call 911   The patient verbalized understanding of instructions, educational materials, and care plan provided today and DECLINED offer to receive copy of patient instructions, educational materials, and care plan.   Telephone follow up appointment with care management team member scheduled for:03-24-2023 at 10:00 am  Rosina Forte, BSN RN RN Care Manager  [facility]  Ambulatory Care Management  Direct Number: [phone]

## 2023-01-24 NOTE — Patient Outreach (Signed)
 Care Management   Visit Note  01/24/2023 Name: Kyle Farley MRN: [id number] DOB: 1940-04-14  Subjective: Kyle Farley is a 83 y.o. year old male who is a primary care patient of [name], Kirsten, MD. The Care Management team was consulted for assistance.      Engaged with patient spoke with patient by telephone.    Goals Addressed             This Visit's Progress    RNCM Care Management  Expected Outcome:  Monitor, Self-Manage and Reduce Symptoms of Afib       Current Barriers:  Chronic Disease Management support and education needs related to effective management of AFIB  Planned Interventions: Provider order and care plan reviewed.  Denies any changes Counseled on increased risk of stroke due to Afib and benefits of anticoagulation for stroke prevention           Reviewed importance of adherence to anticoagulant exactly as prescribed. Reports compliance with Eliquis  Advised patient to discuss changes in AFIB, questions or concerns with provider Counseled on bleeding risk associated with AFIB and importance of self-monitoring for signs/symptoms of bleeding Counseled on avoidance of NSAIDs due to increased bleeding risk with anticoagulants Counseled on importance of regular laboratory monitoring as prescribed. Has labs on a regular basis.  Counseled on seeking medical attention after a head injury or if there is blood in the urine/stool. Review on being safe and monitoring for acute changes Afib action plan reviewed Screening for signs and symptoms of depression related to chronic disease state Assessed social determinant of health barriers  Symptom Management: Take medications as prescribed   Attend all scheduled provider appointments Call provider office for new concerns or questions  call the Suicide and Crisis Lifeline: 988 call the [place]  National Suicide Prevention Lifeline: (289)634-9646 or TTY: (475) 358-1271 TTY (816)390-7483) to talk to a trained counselor call  1-800-273-TALK (toll free, 24 hour hotline) go to [facility] [address] [place] [phone]) if experiencing a Mental Health or Behavioral Health Crisis  - make a plan to eat healthy - keep all lab appointments - take medicine as prescribed  Follow Up Plan: Telephone follow up appointment with care management team member scheduled for: 03-24-2023 at 1000 am     RNCM Care Management  Expected Outcome:  Monitor, Self-Manage, and Reduce Symptoms of Hypertension       Current Barriers:  Knowledge Deficits related to the importance of calling the providers when out of medications or changes in HTN or heart health Care Coordination needs related to pharmacy support and education, referral pending in a patient with HTN and other chronic conditions Chronic Disease Management support and education needs related to effective management of HTN BP Readings from Last 3 Encounters:  01/10/23 (!) 104/59  12/15/22 128/68  10/20/22 132/68     Planned Interventions: Evaluation of current treatment plan related to hypertension self management and patient's adherence to plan as established by provider.  Provided education to patient re: stroke prevention, s/s of heart attack and stroke; Reviewed prescribed diet heart healthy diet.  Reviewed medications with patient and discussed importance of compliance.  Discussed plans with patient for ongoing care management follow up and provided patient with direct contact information for care management team; Advised patient, providing education and rationale, to monitor blood pressure daily and record, calling PCP for findings outside established parameters; Continues to not monitor BP at home, reports normal BP at his prior office visit. RNCM provided  education. Reviewed scheduled/upcoming provider appointments including: with PCP on 03-23-2023 Advised patient to discuss changes in his HTN or  heart health with  provider; Provided education on prescribed diet heart healthy diet;  Discussed complications of poorly controlled blood pressure such as heart disease, stroke, circulatory complications, vision complications, kidney impairment, sexual dysfunction;  Screening for signs and symptoms of depression related to chronic disease state;  Assessed social determinant of health barriers;   Symptom Management: Take medications as prescribed   Attend all scheduled provider appointments Call provider office for new concerns or questions  call the Suicide and Crisis Lifeline: 988 call the [place]  National Suicide Prevention Lifeline: 601-641-7736 or TTY: 504-336-9575 TTY (445)540-8807) to talk to a trained counselor call 1-800-273-TALK (toll free, 24 hour hotline) go to [facility] [address], [place] [phone]) if experiencing a Mental Health or Behavioral Health Crisis  check blood pressure weekly learn about high blood pressure call doctor for signs and symptoms of high blood pressure develop an action plan for high blood pressure keep all doctor appointments take medications for blood pressure exactly as prescribed report new symptoms to your doctor  Follow Up Plan: Telephone follow up appointment with care management team member scheduled for: 03-24-2023 at 1000 am      RNCM Care Management Expected Outcome:  Monitor, Self-Manage and Reduce Symptoms of Heart Failure       Current Barriers:  Knowledge Deficits related to the benefits of weighing daily to monitor for excess fluid and to prevent an exacerbation of HF Care Coordination needs related to education and support from pharm D for medication management and support  in a patient with HF Chronic Disease Management support and education needs related to effective management of HF Wt Readings from Last 3 Encounters:  01/10/23 229 lb 3.2 oz (104 kg)  12/15/22 230 lb (104.3 kg)  10/20/22 221 lb (100.2  kg)     Planned Interventions: Basic overview and discussion of pathophysiology of Heart Failure reviewed. Patient states has stopped weighing daily and will weigh on occasion. He states he does have swelling in his legs/feet but reports compliance with medication regime. Reports sleeping in recliner due to his breathing. RNCM reviewed when to call provider; weight gain of >2 lbs in a day or 5 lbs in a week. Patient states he use to follow that rule but when he would call, the office did not intervene so he stopped keeping track of it. Provided education on low sodium diet.  Reviewed Heart Failure Action Plan in depth and provided written copy Assessed need for readable accurate scales in home. Recently stopped weighing daily. RNCM provided education on the need to continue daily weighing and when to notify provider. Discussed importance of daily weight and advised patient to weigh and record daily.  RNCM provided reminder to patient about notifying his provider when his weight is up 2 lbs in a day or 5 lbs in a week.  Reviewed role of diuretics in prevention of fluid overload and management of heart failure.  Discussed the importance of keeping all appointments with provider Advised patient to discuss changes in fluid balance, changes in HF, questions, or concerns with provider Screening for signs and symptoms of depression related to chronic disease state  Assessed social determinant of health barriers   Symptom Management: Take medications as prescribed   Attend all scheduled provider appointments Call provider office for new concerns or questions  call the Suicide and Crisis Lifeline: 988 call  the [place]  National Suicide Prevention Lifeline: 260-695-9873 or TTY: (409)081-9875 TTY (684) 116-7559) to talk to a trained counselor call 1-800-273-TALK (toll free, 24 hour hotline) go to [facility] [address], [place] [phone]) if experiencing a  Mental Health or Behavioral Health Crisis  call office if I gain more than 2 pounds in one day or 5 pounds in one week use salt in moderation watch for swelling in feet, ankles and legs every day weigh myself daily develop a rescue plan follow rescue plan if symptoms flare-up  Follow Up Plan: Telephone follow up appointment with care management team member scheduled for: 03-24-2023 at 10:00 am       RNCM Care Management:  Maintain, Monitor and Self-Manage Symptoms of COPD       Current Barriers:  Knowledge Deficits related to the importance of following the plan of care and taking medications as directed for effective management of COPD Care Coordination needs related to pharmacy needs  in a patient with COPD Chronic Disease Management support and education needs related to effective management of COPD Current every day smoker of 2 PPD  Uses oxygen  3 liters at night, sleeps in a recliner due to inability to lay down because of shortness of breath  Planned Interventions: Provided patient with basic written and verbal COPD education on self care/management/and exacerbation prevention. Patient noted to be dyspneic during outreach and patient states he had just walked to the bathroom. He reports low FiO2 on previous days but has not checked this morning. He reports that he has tried to use his oxygen  during the day and reports that did not help him recover as fast as it should. He states a few days ago, his breathing was doing really well and he was able to get outside more. RNCM advised to reduce in home air pollutants as this may  be a contributing factor. He reports smoking 2 packs her day, and cats living in the home. He is considering obtaining a humidifier as he feels like this may help him. RNCM reiterated to patient that if he was having difficulty breathing he needed to report to the ED. Patient  continued to refuse stating I was just in the hospital at the beginning of January. Advised  patient to track and manage COPD triggers. Review of monitoring for changes that may exacerbate his COPD. The patient is aware and knows to watch for acute changes.  Provided written and verbal instructions on pursed lip breathing and utilized returned demonstration as teach back Provided instruction about proper use of medications used for management of COPD including inhalers.  RNCM PROVIDED REMINDER: ADVISED PATIENT TO SELF ASSESSES COPD ACTION PLAN ZONE AND MAKE APPOINTMENT WITH PROVIDER IF IN THE YELLOW ZONE FOR 48 HOURS WITHOUT IMPROVEMENT Advised patient to engage in light exercise as tolerated 3-5 days a week to aid in the the management of COPD Provided education about and advised patient to utilize infection prevention strategies to reduce risk of respiratory infection Discussed the importance of adequate rest and management of fatigue with COPD Screening for signs and symptoms of depression related to chronic disease state  Assessed social determinant of health barriers  Symptom Management: Take medications as prescribed   Attend all scheduled provider appointments Call provider office for new concerns or questions  call the Suicide and Crisis Lifeline: 988 call the [place]  National Suicide Prevention Lifeline: 636-086-9754 or TTY: (628) 220-1405 TTY 2088666183) to talk to a trained counselor call 1-800-273-TALK (toll free, 24 hour  hotline) go to [facility] [address] [place] [phone]) if experiencing a Mental Health or Behavioral Health Crisis  identify and remove indoor air pollutants limit outdoor activity during cold weather listen for public air quality announcements every day develop a rescue plan eliminate symptom triggers at home follow rescue plan if symptoms flare-up  Follow Up Plan: Telephone follow up appointment with care management team member scheduled for: 03-24-2023 at 10:00 am           Consent to Services:   Patient was given information about care management services, agreed to services, and gave verbal consent to participate.   Plan: Telephone follow up appointment with care management team member scheduled for:03-24-2023 at 10:00 am  Rosina Forte, BSN RN RN Care Manager  [facility]  Ambulatory Care Management  Direct Number: [phone]

## 2023-02-02 ENCOUNTER — Other Ambulatory Visit: Payer: Self-pay | Admitting: Nurse Practitioner

## 2023-02-02 ENCOUNTER — Other Ambulatory Visit: Payer: Self-pay

## 2023-02-02 MED ORDER — LEVOTHYROXINE SODIUM 50 MCG PO TABS: 50.0000 ug | ORAL_TABLET | Freq: Every day | ORAL | 1 refills | Status: DC

## 2023-02-14 ENCOUNTER — Ambulatory Visit (INDEPENDENT_AMBULATORY_CARE_PROVIDER_SITE_OTHER): Payer: HMO | Admitting: Podiatry

## 2023-02-14 ENCOUNTER — Encounter: Payer: Self-pay | Admitting: Podiatry

## 2023-02-14 DIAGNOSIS — E1142 Type 2 diabetes mellitus with diabetic polyneuropathy: Secondary | ICD-10-CM | POA: Diagnosis not present

## 2023-02-14 NOTE — Progress Notes (Unsigned)
  Subjective:  Patient ID: Kyle Farley, male    DOB: 1940/02/15,  MRN: [id number]  Chief Complaint  Patient presents with   Diabetic Neuropathy    1 month fu for the neuropathy in his feet, takes 100 mg Gabapentin TID, does not feel very much relief, no topicals used.  Suggested volteran gel.  Last A1c 6.4 takes ASA 81 and elliquis. Last seen primary in Nov.     83 y.o. male presents with the above complaint.following up for diabetic neuropathy.  Reports last A1c 6.4.  He is on Eliquis chronically.  Does have history of heart failure.  He has been on gabapentin 100 mg 3 times a day.  He denies any adverse effects.  He also states that he has not noticed any significant improvement to his neuropathy symptoms.  He does report continued tobacco use.  He does ambulate using a rollator He denies any nausea, vomiting, fever, chills, chest pain.  Objective:  Physical Exam: warm, cap refill approximately 3 seconds to the digits, pedal hair growth absent.  Pedal skin atrophic, xerotic.  Webspaces clean and dry nail exam onychomycosis of the toenails, onycholysis, and dystrophic nails, within normal limits for length today. DP pulses palpable, protective sensation absent, and vibratory sensation absent, PT pulses faintly palpable. +2 pitting edema. Left Foot:  Pain with palpation of nails due to elongation and dystrophic growth.  Right Foot: Pain with palpation of nails due to elongation and dystrophic growth.   Assessment:   1. Diabetic polyneuropathy associated with type 2 diabetes mellitus (HCC)      Plan:  Patient was evaluated and treated and all questions answered.   # Diabetes with neuropathy -Patient educated on diabetes. Discussed proper diabetic foot care and discussed risks and complications of disease. Educated patient in depth on reasons to return to the office immediately should he/she discover anything concerning or new on the feet. All questions answered. Discussed proper shoes  as well.  - States that he does have appointment end of February for fitting/casting of diabetic shoes. - I have instructed the patient to try increasing his dose of gabapentin.  We will try 300 mg twice daily or 100 mg morning and midday, 300 mg at night, depending on any adverse affects. -Will see patient back in 3 weeks to assess for efficacy.  Will contact her office for refill of medication. -If there is no therapeutic effect, we we will consider another medication. -Did discuss importance of smoking cessation to limit pedal complications associate with diabetes and its role in worsening neuropathy. -Did discuss potential topical treatments as well.   Return in about 3 weeks (around 03/07/2023) for Neuropathy.         Bronwen Betters, DPM [facility]

## 2023-02-23 NOTE — Progress Notes (Signed)
 Cardiology Office Note:    Date:  02/24/2023   ID:  Kyle Farley, DOB [birth year], MRN [id number]  PCP:  Sherre Clapper, MD  Cardiologist:  Redell Leiter, MD    Referring MD: Sherre Clapper, MD please do a thyroid  panel CMP when he sees you next month on amiodarone  he is markedly edematous since podiatry started him on gabapentin  discontinued he will continue his higher dose torsemide  and also check proBNP if edema persist will need to stop amlodipine .   ASSESSMENT:    1. Heart failure with preserved ejection fraction, unspecified HF chronicity (HCC)   2. Hypertensive heart disease with chronic diastolic congestive heart failure (HCC)   3. Paroxysmal atrial fibrillation (HCC)   4. Chronic anticoagulation   5. Mild CAD   6. Mixed hyperlipidemia   7. Chronic obstructive pulmonary disease, unspecified COPD type (HCC)   8. Anemia of chronic disease    PLAN:    In order of problems listed above:  He has marked edema likely related to the addition of gabapentin  continue his current loop diuretic stop the drug I am hesitant to stop amlodipine  with his hypertension.  If unimproved will need to stop amlodipine  and choose an alternative like hydralazine  I suspect he will improve quickly Maintaining sinus rhythm continue low-dose amiodarone  and anticoagulant Has an appointment next month with his PCP and will need to recheck labs including a thyroid  panel CMP with amiodarone  and also check proBNP with his edema Stable CAD having no anginal discomfort continue his beta-blocker and statin and anticoagulant He has very severe COPD he tells me he had a myocardial perfusion study at [facility] I requested records I would not consider him a cardiac interventional candidate. Stable anemia hemoglobin 13.8 in November   Next appointment: He will see me in 6 months and follow-up atrial fibrillation   Medication Adjustments/Labs and Tests Ordered: Current medicines are reviewed at length with the patient  today.  Concerns regarding medicines are outlined above.  Orders Placed This Encounter  Procedures   EKG 12-Lead   No orders of the defined types were placed in this encounter.    History of Present Illness:    Kyle Farley is a 83 y.o. male with a hx of paroxysmal atrial fibrillation maintaining sinus rhythm with beta-blocker chronic anticoagulated hypertensive heart disease with heart failure ejection fraction 50 to 55% mild CAD COPD and hyper lipidemia last seen 01/19/2022. Compliance with diet, lifestyle and medications: Yes  His son is present he is actively involved in his father's care He is now in the last day started using oxygen . He was started on gabapentin  by podiatry and has developed very severe lower extremity edema. He takes a loop diuretic and he also takes amlodipine  for his blood pressure. His oxygen  sats are running 85 to 90% on oxygen  He is short of breath with any activity due to his severe COPD He is not having angina palpitation or syncope He is on low-dose amiodarone  No bleeding from his anticoagulant No muscle pain or weakness with a statin Past Medical History:  Diagnosis Date   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 (HCC) 11/10/2021   Acquired hypothyroidism 11/10/2021   Acute hypoxic respiratory failure (HCC) 04/25/2022   Acute on chronic diastolic heart failure (HCC) 04/25/2022   Acute on chronic respiratory failure with hypoxia (HCC) 04/25/2022   Acute on chronic systolic heart failure (HCC) 11/10/2021   AKI (acute kidney injury) (HCC) 06/08/2022   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/10/2021  Alcohol use 09/12/2020   Anemia of chronic disease 04/25/2022   Arthritis    Atherosclerosis of native arteries of extremities with intermittent claudication, bilateral legs (HCC) 11/10/2021   Bilateral leg edema 02/16/2022   BPH (benign prostatic hyperplasia) 11/10/2021   CHF (congestive heart failure) (HCC)    Chronic  diastolic CHF (congestive heart failure) (HCC) 04/25/2022   Chronic hyponatremia 04/25/2022   Chronic respiratory failure with hypoxia (HCC) 05/09/2022   Cigarette smoker 10/21/2014   Class 1 obesity 04/25/2022   Cognitive communication deficit 03/05/2021   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)    Diabetic polyneuropathy associated with type 2 diabetes mellitus (HCC) 08/28/2022   Dyspnea    Dysrhythmia    atrial fibrillation   Elevated glucose 05/27/2022   Encounter for prostate cancer screening 02/16/2022   Encounter for screening for lung cancer 02/16/2022   Essential hypertension 10/21/2014   Femur fracture, right (HCC) 09/12/2020   GERD (gastroesophageal reflux disease)    History of kidney stones    Hyperlipidemia 01/24/2017   Hypertension    Hypo-osmolality and hyponatremia 03/05/2021   Hyponatremia 04/26/2022   Hypotension 08/05/2022   Kidney stones 01/24/2017   Laceration of right eyebrow 06/20/2022   Medication monitoring encounter 12/18/2022   Muscle wasting and atrophy, not elsewhere classified, multiple sites 03/05/2021   Nicotine  dependence, cigarettes, uncomplicated 03/05/2021   Non-seasonal allergic rhinitis due to pollen 06/06/2022   Other abnormalities of gait and mobility 03/05/2021   Other chest pain 12/18/2022   Other lack of coordination 03/05/2021   Oxygen  dependent 08/05/2022   Paroxysmal atrial fibrillation (HCC) 10/21/2014   Pressure injury of skin 09/16/2020   Right ureteral stone 06/08/2022   Sepsis secondary to UTI (HCC) 06/08/2022   SOB (shortness of breath) 05/10/2022   Syncope and collapse 08/05/2022   Tobacco abuse 02/16/2022   Tobacco use disorder 02/16/2022   Type 2 diabetes mellitus (HCC) 06/08/2022   Visit for suture removal 06/15/2022    Current Medications: Current Meds  Medication Sig   albuterol  ([name] ) (2.5 MG/3ML) 0.083% nebulizer solution Take 3 mLs (2.5 mg total) by nebulization every 6 (six) hours as needed for  wheezing or shortness of breath.   albuterol  (VENTOLIN  HFA) 108 (90 Base) MCG/ACT inhaler Inhale 2 puffs into the lungs every 4 (four) hours as needed for wheezing or shortness of breath.   amiodarone  (PACERONE ) 200 MG tablet Take 1 tablet (200 mg total) by mouth daily.   amLODipine  (NORVASC ) 5 MG tablet Take 1 tablet by mouth once daily   apixaban  (ELIQUIS ) 5 MG TABS tablet Take 1 tablet (5 mg total) by mouth 2 (two) times daily.   atorvastatin  (LIPITOR) 40 MG tablet Take 1 tablet (40 mg total) by mouth daily.   Docusate Sodium  (STOOL SOFTENER LAXATIVE PO) Take 1 tablet by mouth daily.   ferrous sulfate  325 (65 FE) MG EC tablet Take 325 mg by mouth daily.   gabapentin  (NEURONTIN ) 300 MG capsule Take 300 mg by mouth 2 (two) times daily.   JARDIANCE  10 MG TABS tablet Take 10 mg by mouth daily.   levothyroxine  (SYNTHROID ) 50 MCG tablet Take 1 tablet (50 mcg total) by mouth daily before breakfast.   metoprolol  succinate (TOPROL -XL) 50 MG 24 hr tablet Take 12.5 mg by mouth daily. Take with or immediately following a meal.   montelukast  (SINGULAIR ) 10 MG tablet Take 1 tablet (10 mg total) by mouth daily at 12 noon.   nitroGLYCERIN  (NITROSTAT ) 0.4 MG SL tablet DISSOLVE ONE TABLET  UNDER THE TONGUE EVERY 5 MINUTES AS NEEDED FOR CHEST PAIN.  DO NOT EXCEED A TOTAL OF 3 DOSES IN 15 MINUTES   OXYGEN  Inhale 3 L into the lungs as needed (shortness of breath).   pantoprazole  (PROTONIX ) 40 MG tablet Take 1 tablet (40 mg total) by mouth daily.   tamsulosin  (FLOMAX ) 0.4 MG CAPS capsule Take 1 capsule by mouth once daily   torsemide  (DEMADEX ) 20 MG tablet Take 1 tablet by mouth twice daily as needed   TRELEGY ELLIPTA  200-62.5-25 MCG/ACT AEPB Inhale 1 puff into the lungs daily.      EKGs/Labs/Other Studies Reviewed:    The following studies were reviewed today:  Cardiac Studies & Procedures   CARDIAC CATHETERIZATION  CARDIAC CATHETERIZATION 09/15/2020  Narrative   Ost RCA to Prox RCA lesion is 50%  stenosed.   Prox LAD lesion is 30% stenosed with 0% stenosed side branch in 1st Diag.   Prox Cx to Mid Cx lesion is 40% stenosed.  1.  Nonobstructive coronary artery disease with mild nonobstructive plaquing in the LAD, left circumflex, and moderate calcific stenosis of the RCA ostium. 2.  Low normal LVEDP  Recommend: No high-grade coronary stenoses identified.  Patient with femur fracture.  He should not be at excessive risk of ischemic cardiac complications.  Findings Coronary Findings Diagnostic  Dominance: Right  Left Main The vessel exhibits minimal luminal irregularities. The left main is patent with no obstructive disease.  The left main trifurcates into the LAD, left circumflex, and a small intermediate branch.  Left Anterior Descending Prox LAD lesion is 30% stenosed with 0% stenosed side branch in 1st Diag. Mild plaquing noted at the first septal perforator with hypodensity and approximately 30% stenosis  Left Circumflex Prox Cx to Mid Cx lesion is 40% stenosed.  Right Coronary Artery There is mild diffuse disease throughout the vessel. Ost RCA to Prox RCA lesion is 50% stenosed. The lesion is calcified. Heavy calcification of the right coronary cusp with moderate nonobstructive plaquing of 50%.  There is good contrast reflux and no pressure dampening with the catheter engaged in the artery.  Intervention  No interventions have been documented.   STRESS TESTS  MYOCARDIAL PERFUSION IMAGING 12/30/2022  ECHOCARDIOGRAM  ECHOCARDIOGRAM COMPLETE 04/25/2022  Narrative ECHOCARDIOGRAM REPORT    Patient Name:   Kyle Farley Date of Exam: 04/25/2022 Medical Rec #:  [id number]       Height:       69.0 in Accession #:    [id number]      Weight:       222.0 lb Date of Birth:  [birth year]        BSA:          2.160 m Patient Age:    83 years        BP:           124/93 mmHg Patient Gender: M               HR:           56 bpm. Exam Location:  Inpatient  Procedure: 2D Echo,  Color Doppler, Cardiac Doppler and Intracardiac Opacification Agent  Indications:     I50.31 Acute diastolic (congestive) heart failure  History:         Patient has prior history of Echocardiogram examinations, most recent 06/19/2021. CHF, COPD, Arrythmias:Atrial Fibrillation; Risk Factors:Hypertension and Dyslipidemia.  Sonographer:     Damien Senior RDCS Referring Phys:  JUSTIN B HOWERTER Diagnosing Phys: Jerel Croitoru  MD   Sonographer Comments: Technically difficult due to lung interference. IMPRESSIONS   1. Left ventricular ejection fraction, by estimation, is 60 to 65%. The left ventricle has normal function. The left ventricle has no regional wall motion abnormalities. There is mild concentric left ventricular hypertrophy. Left ventricular diastolic parameters are consistent with Grade II diastolic dysfunction (pseudonormalization). 2. Right ventricular systolic function is low normal. The right ventricular size is moderately enlarged. 3. Left atrial size was moderately dilated. 4. Right atrial size was moderately dilated. 5. The mitral valve is degenerative. Mild mitral valve regurgitation. 6. The aortic valve is calcified. There is mild calcification of the aortic valve. There is moderate thickening of the aortic valve. Aortic valve regurgitation is trivial. Mild to moderate aortic valve stenosis. Aortic valve mean gradient measures 17.0 mmHg. Aortic valve Vmax measures 2.82 m/s. 7. The inferior vena cava is normal in size with greater than 50% respiratory variability, suggesting right atrial pressure of 3 mmHg.  Comparison(s): Prior images reviewed side by side. The left ventricular function has improved. Aortic stenosis is now present.  FINDINGS Left Ventricle: Left ventricular ejection fraction, by estimation, is 60 to 65%. The left ventricle has normal function. The left ventricle has no regional wall motion abnormalities. Definity  contrast agent was given IV to delineate  the left ventricular endocardial borders. The left ventricular internal cavity size was normal in size. There is mild concentric left ventricular hypertrophy. Left ventricular diastolic parameters are consistent with Grade II diastolic dysfunction (pseudonormalization).  Right Ventricle: The right ventricular size is moderately enlarged. Right vetricular wall thickness was not well visualized. Right ventricular systolic function is low normal.  Left Atrium: Left atrial size was moderately dilated.  Right Atrium: Right atrial size was moderately dilated.  Pericardium: There is no evidence of pericardial effusion. Presence of epicardial fat layer.  Mitral Valve: The mitral valve is degenerative in appearance. Mild mitral annular calcification. Mild mitral valve regurgitation.  Tricuspid Valve: The tricuspid valve is grossly normal. Tricuspid valve regurgitation is trivial.  Aortic Valve: The aortic valve is calcified. There is mild calcification of the aortic valve. There is moderate thickening of the aortic valve. Aortic valve regurgitation is trivial. Mild to moderate aortic stenosis is present. Aortic valve mean gradient measures 17.0 mmHg. Aortic valve peak gradient measures 31.8 mmHg. Aortic valve area, by VTI measures 1.53 cm.  Pulmonic Valve: The pulmonic valve was grossly normal. Pulmonic valve regurgitation is not visualized.  Aorta: The aortic root and ascending aorta are structurally normal, with no evidence of dilitation.  Venous: The inferior vena cava is normal in size with greater than 50% respiratory variability, suggesting right atrial pressure of 3 mmHg.  IAS/Shunts: No atrial level shunt detected by color flow Doppler.   LEFT VENTRICLE PLAX 2D LVIDd:         4.00 cm   Diastology LVIDs:         2.60 cm   LV e' medial:    6.53 cm/s LV PW:         1.30 cm   LV E/e' medial:  12.5 LV IVS:        1.10 cm   LV e' lateral:   7.83 cm/s LVOT diam:     2.30 cm   LV E/e'  lateral: 10.4 LV SV:         107 LV SV Index:   50 LVOT Area:     4.15 cm   RIGHT VENTRICLE RV S prime:  11.10 cm/s TAPSE (M-mode): 2.3 cm  LEFT ATRIUM             Index        RIGHT ATRIUM           Index LA diam:        3.80 cm 1.76 cm/m   RA Area:     28.00 cm LA Vol (A2C):   74.9 ml 34.68 ml/m  RA Volume:   94.80 ml  43.89 ml/m LA Vol (A4C):   88.9 ml 41.16 ml/m LA Biplane Vol: 92.3 ml 42.74 ml/m AORTIC VALVE AV Area (Vmax):    1.89 cm AV Area (Vmean):   1.63 cm AV Area (VTI):     1.53 cm AV Vmax:           282.00 cm/s AV Vmean:          195.000 cm/s AV VTI:            0.699 m AV Peak Grad:      31.8 mmHg AV Mean Grad:      17.0 mmHg LVOT Vmax:         128.00 cm/s LVOT Vmean:        76.600 cm/s LVOT VTI:          0.258 m LVOT/AV VTI ratio: 0.37  AORTA Ao Root diam: 3.30 cm Ao Asc diam:  3.30 cm  MITRAL VALVE MV Area (PHT): 3.21 cm    SHUNTS MV Decel Time: 236 msec    Systemic VTI:  0.26 m MV E velocity: 81.70 cm/s  Systemic Diam: 2.30 cm MV A velocity: 84.90 cm/s MV E/A ratio:  0.96  Mihai Croitoru MD Electronically signed by Jerel Balding MD Signature Date/Time: 04/25/2022/3:27:20 PM    Final (Updated)                 Recent Labs: 05/05/2022: TSH 3.770 06/10/2022: B Natriuretic Peptide 839.5; Magnesium  2.0 12/15/2022: ALT 16; BUN 9; Creatinine, Ser 1.14; Hemoglobin 13.8; Platelets 223; Potassium 4.2; Sodium 137  Recent Lipid Panel    Component Value Date/Time   CHOL 168 08/25/2022 1035   TRIG 76 08/25/2022 1035   HDL 87 08/25/2022 1035   CHOLHDL 1.9 08/25/2022 1035   LDLCALC 67 08/25/2022 1035    Physical Exam:    VS:  BP (!) 142/78   Pulse (!) 57   Ht 5' 9 (1.753 m)   Wt 252 lb 9.6 oz (114.6 kg)   SpO2 (!) 88%   BMI 37.30 kg/m     Wt Readings from Last 3 Encounters:  02/24/23 252 lb 9.6 oz (114.6 kg)  01/10/23 229 lb 3.2 oz (104 kg)  12/15/22 230 lb (104.3 kg)     GEN: COPD appearance quite obese well nourished, well  developed in no acute distress HEENT: Normal NECK: No JVD; No carotid bruits LYMPHATICS: No lymphadenopathy CARDIAC: Distant heart sounds RRR, no murmurs, rubs, gallops RESPIRATORY:  Clear to auscultation without rales, wheezing or rhonchi  ABDOMEN: He has tense brawny marked edema both lower extremities MUSCULOSKELETAL: edema; No deformity  SKIN: Warm and dry NEUROLOGIC:  Alert and oriented x 3 PSYCHIATRIC:  Normal affect    Signed, Redell Leiter, MD  02/24/2023 10:02 AM    [facility]

## 2023-02-24 ENCOUNTER — Encounter: Payer: Self-pay | Admitting: Cardiology

## 2023-02-24 ENCOUNTER — Ambulatory Visit: Payer: PPO | Attending: Cardiology | Admitting: Cardiology

## 2023-02-24 VITALS — BP 142/78 | HR 57 | Ht 69.0 in | Wt 252.6 lb

## 2023-02-24 DIAGNOSIS — I5030 Unspecified diastolic (congestive) heart failure: Secondary | ICD-10-CM | POA: Diagnosis not present

## 2023-02-24 DIAGNOSIS — Z7901 Long term (current) use of anticoagulants: Secondary | ICD-10-CM | POA: Diagnosis not present

## 2023-02-24 DIAGNOSIS — I480 Paroxysmal atrial fibrillation: Secondary | ICD-10-CM | POA: Diagnosis not present

## 2023-02-24 DIAGNOSIS — J449 Chronic obstructive pulmonary disease, unspecified: Secondary | ICD-10-CM

## 2023-02-24 DIAGNOSIS — I499 Cardiac arrhythmia, unspecified: Secondary | ICD-10-CM | POA: Insufficient documentation

## 2023-02-24 DIAGNOSIS — D638 Anemia in other chronic diseases classified elsewhere: Secondary | ICD-10-CM | POA: Diagnosis not present

## 2023-02-24 DIAGNOSIS — I2510 Atherosclerotic heart disease of native coronary artery without angina pectoris: Secondary | ICD-10-CM | POA: Diagnosis not present

## 2023-02-24 DIAGNOSIS — E782 Mixed hyperlipidemia: Secondary | ICD-10-CM

## 2023-02-24 DIAGNOSIS — I5032 Chronic diastolic (congestive) heart failure: Secondary | ICD-10-CM | POA: Diagnosis not present

## 2023-02-24 DIAGNOSIS — I110 Hypertensive heart disease with heart failure: Secondary | ICD-10-CM

## 2023-02-24 NOTE — Addendum Note (Signed)
 Addended by: Aurelio Leer I on: 02/24/2023 10:27 AM   Modules accepted: Orders

## 2023-02-24 NOTE — Patient Instructions (Signed)
 Medication Instructions:  Your physician has recommended you make the following change in your medication:   STOP: Gabapentin   *If you need a refill on your cardiac medications before your next appointment, please call your pharmacy*   Lab Work: Your physician recommends that you return for lab work in:   Labs at Dr. [facility] office next month: CMP, Pro BNP, TSH T3 T4  If you have labs (blood work) drawn today and your tests are completely normal, you will receive your results only by: MyChart Message (if you have MyChart) OR A paper copy in the mail If you have any lab test that is abnormal or we need to change your treatment, we will call you to review the results.   Testing/Procedures: None   Follow-Up: At [facility], you and your health needs are our priority.  As part of our continuing mission to provide you with exceptional heart care, we have created designated Provider Care Teams.  These Care Teams include your primary Cardiologist (physician) and Advanced Practice Providers (APPs -  Physician Assistants and Nurse Practitioners) who all work together to provide you with the care you need, when you need it.  We recommend signing up for the patient portal called MyChart.  Sign up information is provided on this After Visit Summary.  MyChart is used to connect with patients for Virtual Visits (Telemedicine).  Patients are able to view lab/test results, encounter notes, upcoming appointments, etc.  Non-urgent messages can be sent to your provider as well.   To learn more about what you can do with MyChart, go to [email or web address].    Your next appointment:   6 month(s)  Provider:   Redell Leiter, MD    Other Instructions None

## 2023-03-04 DIAGNOSIS — Z87442 Personal history of urinary calculi: Secondary | ICD-10-CM | POA: Diagnosis not present

## 2023-03-04 DIAGNOSIS — F419 Anxiety disorder, unspecified: Secondary | ICD-10-CM | POA: Diagnosis not present

## 2023-03-04 DIAGNOSIS — I083 Combined rheumatic disorders of mitral, aortic and tricuspid valves: Secondary | ICD-10-CM | POA: Diagnosis not present

## 2023-03-04 DIAGNOSIS — R918 Other nonspecific abnormal finding of lung field: Secondary | ICD-10-CM | POA: Diagnosis not present

## 2023-03-04 DIAGNOSIS — I350 Nonrheumatic aortic (valve) stenosis: Secondary | ICD-10-CM | POA: Diagnosis not present

## 2023-03-04 DIAGNOSIS — I509 Heart failure, unspecified: Secondary | ICD-10-CM | POA: Diagnosis not present

## 2023-03-04 DIAGNOSIS — H6123 Impacted cerumen, bilateral: Secondary | ICD-10-CM | POA: Diagnosis not present

## 2023-03-04 DIAGNOSIS — E871 Hypo-osmolality and hyponatremia: Secondary | ICD-10-CM | POA: Diagnosis not present

## 2023-03-04 DIAGNOSIS — F17210 Nicotine dependence, cigarettes, uncomplicated: Secondary | ICD-10-CM | POA: Diagnosis not present

## 2023-03-04 DIAGNOSIS — F1020 Alcohol dependence, uncomplicated: Secondary | ICD-10-CM | POA: Diagnosis not present

## 2023-03-04 DIAGNOSIS — I110 Hypertensive heart disease with heart failure: Secondary | ICD-10-CM | POA: Diagnosis not present

## 2023-03-04 DIAGNOSIS — I2510 Atherosclerotic heart disease of native coronary artery without angina pectoris: Secondary | ICD-10-CM | POA: Diagnosis not present

## 2023-03-04 DIAGNOSIS — E875 Hyperkalemia: Secondary | ICD-10-CM | POA: Diagnosis not present

## 2023-03-04 DIAGNOSIS — I4891 Unspecified atrial fibrillation: Secondary | ICD-10-CM | POA: Diagnosis not present

## 2023-03-04 DIAGNOSIS — J9611 Chronic respiratory failure with hypoxia: Secondary | ICD-10-CM | POA: Diagnosis not present

## 2023-03-04 DIAGNOSIS — E039 Hypothyroidism, unspecified: Secondary | ICD-10-CM | POA: Diagnosis not present

## 2023-03-04 DIAGNOSIS — K219 Gastro-esophageal reflux disease without esophagitis: Secondary | ICD-10-CM | POA: Diagnosis not present

## 2023-03-04 DIAGNOSIS — I5033 Acute on chronic diastolic (congestive) heart failure: Secondary | ICD-10-CM | POA: Diagnosis not present

## 2023-03-04 DIAGNOSIS — E7800 Pure hypercholesterolemia, unspecified: Secondary | ICD-10-CM | POA: Diagnosis not present

## 2023-03-04 DIAGNOSIS — Z7901 Long term (current) use of anticoagulants: Secondary | ICD-10-CM | POA: Diagnosis not present

## 2023-03-04 DIAGNOSIS — J441 Chronic obstructive pulmonary disease with (acute) exacerbation: Secondary | ICD-10-CM | POA: Diagnosis not present

## 2023-03-04 DIAGNOSIS — N400 Enlarged prostate without lower urinary tract symptoms: Secondary | ICD-10-CM | POA: Diagnosis not present

## 2023-03-04 DIAGNOSIS — Z9981 Dependence on supplemental oxygen: Secondary | ICD-10-CM | POA: Diagnosis not present

## 2023-03-04 DIAGNOSIS — H6691 Otitis media, unspecified, right ear: Secondary | ICD-10-CM | POA: Diagnosis not present

## 2023-03-04 DIAGNOSIS — I4510 Unspecified right bundle-branch block: Secondary | ICD-10-CM | POA: Diagnosis not present

## 2023-03-04 DIAGNOSIS — R9431 Abnormal electrocardiogram [ECG] [EKG]: Secondary | ICD-10-CM | POA: Diagnosis not present

## 2023-03-04 DIAGNOSIS — J449 Chronic obstructive pulmonary disease, unspecified: Secondary | ICD-10-CM | POA: Diagnosis not present

## 2023-03-04 DIAGNOSIS — D539 Nutritional anemia, unspecified: Secondary | ICD-10-CM | POA: Diagnosis not present

## 2023-03-04 DIAGNOSIS — E119 Type 2 diabetes mellitus without complications: Secondary | ICD-10-CM | POA: Diagnosis not present

## 2023-03-04 DIAGNOSIS — M1990 Unspecified osteoarthritis, unspecified site: Secondary | ICD-10-CM | POA: Diagnosis not present

## 2023-03-06 DIAGNOSIS — I350 Nonrheumatic aortic (valve) stenosis: Secondary | ICD-10-CM

## 2023-03-06 DIAGNOSIS — I509 Heart failure, unspecified: Secondary | ICD-10-CM

## 2023-03-06 DIAGNOSIS — I2510 Atherosclerotic heart disease of native coronary artery without angina pectoris: Secondary | ICD-10-CM

## 2023-03-07 ENCOUNTER — Ambulatory Visit: Payer: HMO | Admitting: Podiatry

## 2023-03-08 DIAGNOSIS — I4891 Unspecified atrial fibrillation: Secondary | ICD-10-CM

## 2023-03-08 DIAGNOSIS — I5033 Acute on chronic diastolic (congestive) heart failure: Secondary | ICD-10-CM

## 2023-03-08 DIAGNOSIS — I350 Nonrheumatic aortic (valve) stenosis: Secondary | ICD-10-CM

## 2023-03-09 ENCOUNTER — Telehealth: Payer: Self-pay

## 2023-03-09 NOTE — Transitions of Care (Post Inpatient/ED Visit) (Signed)
03/09/2023  Name: Kyle Farley MRN: [id number] DOB: 1940-03-27  Today's TOC FU Call Status: Today's TOC FU Call Status:: Successful TOC FU Call Completed TOC FU Call Complete Date: 03/09/23 Patient's Name and Date of Birth confirmed.  Transition Care Management Follow-up Telephone Call Date of Discharge: 03/08/23 Discharge Facility: Other ([facility]) Name of Other ([facility]) Discharge Facility: [facility] Type of Discharge: Inpatient Admission Primary Inpatient Discharge Diagnosis:: Increased swelling with CHF How have you been since you were released from the hospital?: Better Any questions or concerns?: No  Items Reviewed: Did you receive and understand the discharge instructions provided?: Yes (patient states the nurse in the hospital reviewed discharge instructions and he denies questions/concerns) Medications obtained,verified, and reconciled?: Yes (Medications Reviewed) Any new allergies since your discharge?: No Dietary orders reviewed?: Yes Type of Diet Ordered:: patient reports heart healthy - d/c summary was not available at time of call Do you have support at home?: Yes People in Home: child(ren), adult Name of Support/Comfort Primary Source: Lives with son, Kyle Farley  Medications Reviewed Today: Medications Reviewed Today     Reviewed by Jessy Oto, RN (Registered Nurse) on 03/09/23 at 1002  Med List Status: <None>   Medication Order Taking? Sig Documenting Provider Last Dose Status Informant  albuterol ([name]) (2.5 MG/3ML) 0.083% nebulizer solution [id number] Yes Take 3 mLs (2.5 mg total) by nebulization every 6 (six) hours as needed for wheezing or shortness of breath. Martina Sinner, MD Taking Active   albuterol (VENTOLIN HFA) 108 (90 Base) MCG/ACT inhaler [id number] Yes Inhale 2 puffs into the lungs every 4 (four) hours as needed for wheezing or shortness of breath. [name], Kirsten, MD Taking Active   amiodarone (PACERONE) 200 MG tablet  [id number] Yes Take 1 tablet (200 mg total) by mouth daily.  Patient taking differently: Take 200 mg by mouth 2 (two) times daily.   [name], Kirsten, MD Taking Active   amLODipine (NORVASC) 5 MG tablet [id number] Yes Take 1 tablet by mouth once daily [name], Kirsten, MD Taking Active   apixaban (ELIQUIS) 5 MG TABS tablet [id number] Yes Take 1 tablet (5 mg total) by mouth 2 (two) times daily. Flossie Dibble, NP Taking Active Self  atorvastatin (LIPITOR) 40 MG tablet [id number] No Take 1 tablet (40 mg total) by mouth daily.  Patient not taking: Reported on 03/09/2023   Flossie Dibble, NP Not Taking Active   Docusate Sodium (STOOL SOFTENER LAXATIVE PO) [id number] No Take 1 tablet by mouth daily.  Patient not taking: Reported on 03/09/2023   [provider] Not Taking Active   ferrous sulfate 325 (65 FE) MG EC tablet [id number] Yes Take 325 mg by mouth daily. [provider] Taking Active   furosemide (LASIX) 40 MG tablet [id number] Yes Take 40 mg by mouth 3 (three) times daily. [provider]  Active   JARDIANCE 10 MG TABS tablet [id number] Yes Take 10 mg by mouth daily. [provider] Taking Active   levothyroxine (SYNTHROID) 50 MCG tablet [id number] Yes Take 1 tablet (50 mcg total) by mouth daily before breakfast. [name], Kirsten, MD Taking Active   metoprolol succinate (TOPROL-XL) 50 MG 24 hr tablet [id number] No Take 12.5 mg by mouth daily. Take with or immediately following a meal.  Patient not taking: Reported on 03/09/2023   [provider] Not Taking Active Self  montelukast (SINGULAIR) 10 MG tablet [id number] Yes Take 1 tablet (10 mg total) by mouth daily at 12 noon. Blane Ohara, MD Taking Active Self  nitroGLYCERIN (NITROSTAT) 0.4 MG SL tablet [id number] Yes DISSOLVE ONE TABLET UNDER THE TONGUE EVERY 5 MINUTES AS NEEDED FOR CHEST PAIN.  DO NOT EXCEED A TOTAL OF 3 DOSES IN 15 MINUTES [name], Kirsten, MD Taking Active   OXYGEN [id number] Yes Inhale 3 L into the  lungs as needed (shortness of breath). [provider] Taking Active Self  pantoprazole (PROTONIX) 40 MG tablet [id number] Yes Take 1 tablet (40 mg total) by mouth daily. [name], Kirsten, MD Taking Active   tamsulosin [facility]) 0.4 MG CAPS capsule [id number] Yes Take 1 capsule by mouth once daily [name], Kirsten, MD Taking Active   torsemide (DEMADEX) 20 MG tablet [id number] No Take 1 tablet by mouth twice daily as needed  Patient not taking: Reported on 03/09/2023   Windell Moment, MD Not Taking Active   [facility] ELLIPTA 200-62.5-25 MCG/ACT AEPB [id number] Yes Inhale 1 puff into the lungs daily. [provider] Taking Active             Home Care and Equipment/Supplies: Were Home Health Services Ordered?: NA (discharge summary not available at time of call - patient does not know if Home Health was ordered - states he's had in the past) Any new equipment or medical supplies ordered?: No  Functional Questionnaire: Do you need assistance with bathing/showering or dressing?: No Do you need assistance with meal preparation?: No Do you need assistance with eating?: No Do you have difficulty maintaining continence: No Do you need assistance with getting out of bed/getting out of a chair/moving?: No (patient has a lift chair - he sleeps in the chair) Do you have difficulty managing or taking your medications?: No  Follow up appointments reviewed: PCP Follow-up appointment confirmed?: Yes Date of PCP follow-up appointment?: 03/23/23 Follow-up Provider: Dr [name] (Patient agrees to call PCP office to see if he can get in sooner for hospital follow up) Specialist Hospital Follow-up appointment confirmed?: Yes Date of Specialist follow-up appointment?: 03/14/23 Follow-Up Specialty Provider:: [place] [employer] (to be measured for diabetic shoes) Do you need transportation to your follow-up appointment?: No (patient has his own truck but his son usually drives him to  appointments) Do you understand care options if your condition(s) worsen?: Yes-patient verbalized understanding  SDOH Interventions Today    Flowsheet Row Most Recent Value  SDOH Interventions   Food Insecurity Interventions Intervention Not Indicated  Housing Interventions Intervention Not Indicated  Transportation Interventions Intervention Not Indicated  Utilities Interventions Intervention Not Indicated      TOC Interventions Today    Flowsheet Row Most Recent Value  TOC Interventions   TOC Interventions Discussed/Reviewed TOC Interventions Discussed  [Educated patient to call PCP office to move up his appt and tell them it is a hospital foloow up and to take his discharge paperwork with him]       Interventions Today    Flowsheet Row Most Recent Value  Chronic Disease   Chronic disease during today's visit Congestive Heart Failure (CHF)  General Interventions   General Interventions Discussed/Reviewed General Interventions Discussed  [Discharge summary was not available at time of call - discussed importance of daily weights, calling MD with weight gain or increased swelling immediately to avoid rehospitalization]  Exercise Interventions   Exercise Discussed/Reviewed Physical Activity  [Educated patient to slowly increase his activity as tolerated or as instructed by provider]  Education Interventions   Education Provided --  [Educated on completeing new antibioitc until gone, monitor for increaased swelling and weight gain and call MD with  issues immediately]  Nutrition Interventions   Nutrition Discussed/Reviewed Nutrition Discussed  [discussed heart healthy diet]  Pharmacy Interventions   Pharmacy Dicussed/Reviewed Medications and their functions  [Pt reported Cephalexin on d/c summary but was unsure what it was for-education provided - Patient reported Furosemide now in place of Torsemide]       Hilbert Odor RN, CCM [place]  [facility]BC[facility]-Population Health RN Care  Manager [phone]

## 2023-03-10 NOTE — Telephone Encounter (Signed)
Copied from CRM [id number]. Topic: General - Other >> Mar 09, 2023  3:02 PM Maxwell Marion wrote: Reason for CRM: Patient was recently discharged from hospital and called for hospital f/u appointment, I scheduled him for February 28th. Patient has an office visit scheduled for the following week on March 5th for a 3 month follow up. If he's able to have that hospital follow up and 3 month follow up both on Feb 28th, can you cancel the apt of March 5th please? I wasn't sure if I could or should

## 2023-03-10 NOTE — Telephone Encounter (Signed)
As of right now the appointments will stay the same. Dr. Sedalia Muta notified. She will reassess at the appointment time.

## 2023-03-14 ENCOUNTER — Encounter: Payer: Self-pay | Admitting: Podiatry

## 2023-03-14 ENCOUNTER — Ambulatory Visit (INDEPENDENT_AMBULATORY_CARE_PROVIDER_SITE_OTHER): Payer: PPO | Admitting: Podiatry

## 2023-03-14 ENCOUNTER — Telehealth: Payer: Self-pay

## 2023-03-14 DIAGNOSIS — R600 Localized edema: Secondary | ICD-10-CM | POA: Diagnosis not present

## 2023-03-14 DIAGNOSIS — E1142 Type 2 diabetes mellitus with diabetic polyneuropathy: Secondary | ICD-10-CM | POA: Diagnosis not present

## 2023-03-14 NOTE — Progress Notes (Unsigned)
  Subjective:  Patient ID: Kyle Farley, male    DOB: [birth year],  MRN: [id number]  Chief Complaint  Patient presents with   Peripheral Neuropathy    5 week check for bilateral neuropathy. Doctor took him off gabbapenton due to swelling, he was hospitalized for 4 days due to fluid build up. Last A1c in Nov 6.1, take elliquis.    83 y.o. male presents with the above complaint.  Patient did not tolerate dosage increase the gabapentin well.  Experienced leg swelling, ended up in the hospital for this.  He does ambulate using a rollator He denies any nausea, vomiting, fever, chills, chest pain.  Objective:  Physical Exam: warm, cap refill approximately 3 seconds to the digits, pedal hair growth absent.  Pedal skin atrophic, xerotic.  [employer] clean and dry nail exam onychomycosis of the toenails, onycholysis, and dystrophic nails, within normal limits for length today. DP pulses palpable, protective sensation absent, and vibratory sensation absent, PT pulses faintly palpable. +2 pitting edema. Left Foot:  Pain with palpation of nails due to elongation and dystrophic growth.  Right Foot: Pain with palpation of nails due to elongation and dystrophic growth.   Assessment:   1. Diabetic polyneuropathy associated with type 2 diabetes mellitus (HCC)   2. Bilateral leg edema      Plan:  Patient was evaluated and treated and all questions answered.   # Diabetes with neuropathy -Patient educated on diabetes. Discussed proper diabetic foot care and discussed risks and complications of disease. Educated patient in depth on reasons to return to the office immediately should he/she discover anything concerning or new on the feet. All questions answered. Discussed proper shoes as well.  - Patient did not tolerate gabapentin dose adjustment well, and experienced worsening lower extremity edema - Advised discontinuing this. Recommend over the counter Capsicin cream daily - Patient interested in  seeing if Qutenza can be covered   Return in about 3 weeks (around 04/04/2023) for Diabetic Foot Care.         Bronwen Betters, DPM [facility] / [facility]

## 2023-03-14 NOTE — Telephone Encounter (Signed)
 Copied from CRM [id number]. Topic: Clinical - Home Health Verbal Orders >> Mar 14, 2023  2:22 PM Antony Haste wrote: Caller/Agency: Joni Reining RN - [facility] Callback Number: [phone] Frequency: PT should have started his home health nursing services today, however he has declined continuing physical therapy and nursing services with home health.  Any new concerns about the patient? No, he's feeling a lot better.

## 2023-03-15 ENCOUNTER — Ambulatory Visit: Payer: PPO

## 2023-03-15 DIAGNOSIS — E1142 Type 2 diabetes mellitus with diabetic polyneuropathy: Secondary | ICD-10-CM

## 2023-03-15 DIAGNOSIS — R600 Localized edema: Secondary | ICD-10-CM

## 2023-03-15 DIAGNOSIS — M2141 Flat foot [pes planus] (acquired), right foot: Secondary | ICD-10-CM

## 2023-03-15 NOTE — Progress Notes (Signed)
 Patient presents to the office today for diabetic shoe and insole measuring.  Patient was measured with brannock device to determine size and width for 1 pair of extra depth shoes and 3 pair of insoles.   Documentation of medical necessity will be sent to patient's treating diabetic doctor to verify and sign.   Patient's diabetic provider: Mickey Farber MD   Shoes and insoles will be ordered at that time and patient will be notified for an appointment for fitting when they arrive.   Shoe size (per patient): 9.5-10 Shoe choice:   [id number] / Black velcro boot if any  Shoe size ordered: 9.5WD Ppw / ABN signed

## 2023-03-17 ENCOUNTER — Telehealth: Payer: Self-pay

## 2023-03-17 NOTE — Telephone Encounter (Signed)
 This was placed in dr. Marcha Solders box

## 2023-03-18 ENCOUNTER — Ambulatory Visit (INDEPENDENT_AMBULATORY_CARE_PROVIDER_SITE_OTHER): Payer: PPO | Admitting: Family Medicine

## 2023-03-18 VITALS — BP 132/60 | HR 67 | Temp 98.0°F | Ht 69.0 in | Wt 235.0 lb

## 2023-03-18 DIAGNOSIS — E782 Mixed hyperlipidemia: Secondary | ICD-10-CM

## 2023-03-18 DIAGNOSIS — I480 Paroxysmal atrial fibrillation: Secondary | ICD-10-CM

## 2023-03-18 DIAGNOSIS — N3000 Acute cystitis without hematuria: Secondary | ICD-10-CM

## 2023-03-18 DIAGNOSIS — J9611 Chronic respiratory failure with hypoxia: Secondary | ICD-10-CM

## 2023-03-18 DIAGNOSIS — Z7951 Long term (current) use of inhaled steroids: Secondary | ICD-10-CM

## 2023-03-18 DIAGNOSIS — E039 Hypothyroidism, unspecified: Secondary | ICD-10-CM

## 2023-03-18 DIAGNOSIS — E1142 Type 2 diabetes mellitus with diabetic polyneuropathy: Secondary | ICD-10-CM | POA: Diagnosis not present

## 2023-03-18 DIAGNOSIS — I10 Essential (primary) hypertension: Secondary | ICD-10-CM

## 2023-03-18 DIAGNOSIS — J449 Chronic obstructive pulmonary disease, unspecified: Secondary | ICD-10-CM

## 2023-03-18 LAB — POCT URINALYSIS DIP (CLINITEK)
Bilirubin, UA: NEGATIVE
Blood, UA: NEGATIVE
Glucose, UA: 100 mg/dL — AB
Ketones, POC UA: NEGATIVE mg/dL
Leukocytes, UA: NEGATIVE
Nitrite, UA: NEGATIVE
POC PROTEIN,UA: NEGATIVE
Spec Grav, UA: 1.01 (ref 1.010–1.025)
Urobilinogen, UA: 0.2 U/dL
pH, UA: 6.5 (ref 5.0–8.0)

## 2023-03-18 NOTE — Progress Notes (Signed)
 Subjective:  Patient ID: Kyle Farley, male    DOB: 06-04-1940  Age: 83 y.o. MRN: [id number]  Chief Complaint  Patient presents with   Hospitalization Follow-up    HPI   Patient presents for hospital follow from [facility] 03-04-23 to 03-08-23. Patient is an 83 year old white male with past medical history significant for COPD, IV tobacco use, chronic alcoholism, sick sinus syndrome, congestive heart failure, atrial fibrillation who presented with progressive swelling of both of his lower extremities accompanied by a cough and congestion over the previous week but much worse in the last 24 hours prior to his admission.  Upon arrival to the emergency department he was hypertensive and tachypneic.  Blood count was normal.  Sodium was mildly low at 129.  Elevated potassium of 5.2.  Normal renal and liver function.  proBNP was elevated at 1200.  Troponins were negative.  Procalcitonin was normal.  TSH was elevated at 9.9 and T4 was normal at 1.6, low T3 at 2.75.  UA was negative other than glucose which she is currently on farxiga.  BAL was negative.  Chest x-ray demonstrated bilateral interstitial opacities suggestive of pulmonary congestion versus pneumonia.  Patient was given 40 mg of IV Lasix with an hour-long breathing treatment.  He also had a loading dose of Solu-Medrol 80 mg x 1.  He was admitted.  After admission to the medical floor he was given an IV Lasix drip at 5 mg/day with good response.  Patient voided more than 5 L a day over the next 48 hours with significant improvement in his bilateral lower extremity edema.  During his hospitalization he was also diagnosed with cerumen impaction bilaterally started on Debrox eardrops and given IV Rocephin for suspected otitis.  Initially he presented with hyperkalemia which responded well to oral Lokelma and IV Lasix.  Despite drinking a sixpack of beer a day he did not show evidence of DTs during his hospital stay.  Patient was discharged on oral Lasix 40  mg a day and recommended to follow-up with cardiology and.  Chronic admission due to COPD and chronic respiratory failure with failure was requiring 4 L of oxygen per nasal cannula.  Upon discharge he was back to his baseline oxygen of 3 L per nasal cannula  Other directions given upon just work discharge were to monitor daily weights.  He was to call PCP if greater than 2 pound weight gain in 24 hours or 3 pounds in 5 days.  His fluids were restricted to 1500 mL.  Recommended repeat his chemistry panel today which we will do.  Strongly recommended to avoid any alcohol.  For his atrial fibrillation he was continued on his amiodarone and Eliquis.  In addition to Lasix 40 mg a day patient was discharged with a new prescription for Keflex 500 mg 3 times daily for the next 6 days.  His torsemide was discontinued as he was noted on Lasix.  Today he feels well.  He has completed his antibiotics.  He is continuing his Lasix 40 mg a day.  He did not denies any significant weight gain and has been tracking his weight.  His leg swelling has fully resolved and remain normal.  Patient denies shortness of breath other than his baseline from his COPD.  He is wearing his oxygen at 3 L per nasal cannula.     10/20/2022    9:17 AM 08/05/2022   11:08 AM 05/27/2022    4:12 PM 04/02/2022   11:24 AM 04/02/2022  10:41 AM  Depression screen PHQ 2/9  Decreased Interest 0 0 0 0 0  Down, Depressed, Hopeless 0 0 0 0 0  PHQ - 2 Score 0 0 0 0 0  Altered sleeping  0 0    Tired, decreased energy  0 2    Change in appetite  0 2    Feeling bad or failure about yourself   0 0    Trouble concentrating  0 0    Moving slowly or fidgety/restless  0 3    Suicidal thoughts  0 0    PHQ-9 Score  0 7    Difficult doing work/chores  Not difficult at all Somewhat difficult          12/20/2022    3:18 PM  Fall Risk   Falls in the past year? 0  Number falls in past yr: 0  Injury with Fall? 0  Risk for fall due to : No Fall Risks     Patient Care Team: Blane Ohara, MD as PCP - General (Family Medicine) Regan Lemming, MD as Consulting Physician (Cardiology) Baldo Daub, MD as Consulting Physician (Cardiology) Ricky Stabs, RN as [facility]BC[facility] Care Management (General Practice)   Review of Systems  Constitutional:  Negative for chills, diaphoresis, fatigue and fever.  HENT:  Negative for congestion, ear pain and sore throat.   Respiratory:  Negative for cough and shortness of breath.   Cardiovascular:  Negative for chest pain and leg swelling.  Gastrointestinal:  Negative for abdominal pain, constipation, diarrhea, nausea and vomiting.  Genitourinary:  Negative for dysuria and urgency.  Musculoskeletal:  Negative for arthralgias and myalgias.  Neurological:  Negative for dizziness and headaches.  Psychiatric/Behavioral:  Negative for dysphoric mood.     Current Outpatient Medications on File Prior to Visit  Medication Sig Dispense Refill   albuterol ([name]) (2.5 MG/3ML) 0.083% nebulizer solution Take 3 mLs (2.5 mg total) by nebulization every 6 (six) hours as needed for wheezing or shortness of breath. 360 mL 12   albuterol (VENTOLIN HFA) 108 (90 Base) MCG/ACT inhaler Inhale 2 puffs into the lungs every 4 (four) hours as needed for wheezing or shortness of breath. 48 g 3   amiodarone (PACERONE) 200 MG tablet Take 1 tablet (200 mg total) by mouth daily. (Patient taking differently: Take 200 mg by mouth 2 (two) times daily.)     amLODipine (NORVASC) 5 MG tablet Take 1 tablet by mouth once daily 90 tablet 0   apixaban (ELIQUIS) 5 MG TABS tablet Take 1 tablet (5 mg total) by mouth 2 (two) times daily. 180 tablet 3   atorvastatin (LIPITOR) 40 MG tablet Take 1 tablet (40 mg total) by mouth daily. 90 tablet 0   Docusate Sodium (STOOL SOFTENER LAXATIVE PO) Take 1 tablet by mouth daily.     ferrous sulfate 325 (65 FE) MG EC tablet Take 325 mg by mouth daily.     furosemide (LASIX) 40 MG tablet Take 40 mg by  mouth 3 (three) times daily.     metoprolol succinate (TOPROL-XL) 50 MG 24 hr tablet Take 12.5 mg by mouth daily. Take with or immediately following a meal.     montelukast (SINGULAIR) 10 MG tablet Take 1 tablet (10 mg total) by mouth daily at 12 noon. 90 tablet 3   nitroGLYCERIN (NITROSTAT) 0.4 MG SL tablet DISSOLVE ONE TABLET UNDER THE TONGUE EVERY 5 MINUTES AS NEEDED FOR CHEST PAIN.  DO NOT EXCEED A TOTAL OF 3 DOSES IN  15 MINUTES 50 tablet 0   OXYGEN Inhale 3 L into the lungs as needed (shortness of breath).     tamsulosin (FLOMAX) 0.4 MG CAPS capsule Take 1 capsule by mouth once daily 90 capsule 0   TRELEGY ELLIPTA 200-62.5-25 MCG/ACT AEPB Inhale 1 puff into the lungs daily.     No current facility-administered medications on file prior to visit.   Past Medical History:  Diagnosis Date   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 (HCC) 11/10/2021   Acquired hypothyroidism 11/10/2021   Acute hypoxic respiratory failure (HCC) 04/25/2022   Acute on chronic diastolic heart failure (HCC) 04/25/2022   Acute on chronic respiratory failure with hypoxia (HCC) 04/25/2022   Acute on chronic systolic heart failure (HCC) 11/10/2021   AKI (acute kidney injury) (HCC) 06/08/2022   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/10/2021   Alcohol use 09/12/2020   Anemia of chronic disease 04/25/2022   Arthritis    Atherosclerosis of native arteries of extremities with intermittent claudication, bilateral legs (HCC) 11/10/2021   Bilateral leg edema 02/16/2022   BPH (benign prostatic hyperplasia) 11/10/2021   CHF (congestive heart failure) (HCC)    Chronic diastolic CHF (congestive heart failure) (HCC) 04/25/2022   Chronic hyponatremia 04/25/2022   Chronic respiratory failure with hypoxia (HCC) 05/09/2022   Cigarette smoker 10/21/2014   Class 1 obesity 04/25/2022   Cognitive communication deficit 03/05/2021   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)    Diabetic polyneuropathy  associated with type 2 diabetes mellitus (HCC) 08/28/2022   Dyspnea    Dysrhythmia    atrial fibrillation   Elevated glucose 05/27/2022   Encounter for prostate cancer screening 02/16/2022   Encounter for screening for lung cancer 02/16/2022   Essential hypertension 10/21/2014   Femur fracture, right (HCC) 09/12/2020   GERD (gastroesophageal reflux disease)    History of kidney stones    Hyperlipidemia 01/24/2017   Hypertension    Hypo-osmolality and hyponatremia 03/05/2021   Hyponatremia 04/26/2022   Hypotension 08/05/2022   Kidney stones 01/24/2017   Laceration of right eyebrow 06/20/2022   Medication monitoring encounter 12/18/2022   Muscle wasting and atrophy, not elsewhere classified, multiple sites 03/05/2021   Nicotine dependence, cigarettes, uncomplicated 03/05/2021   Non-seasonal allergic rhinitis due to pollen 06/06/2022   Other abnormalities of gait and mobility 03/05/2021   Other chest pain 12/18/2022   Other lack of coordination 03/05/2021   Oxygen dependent 08/05/2022   Paroxysmal atrial fibrillation (HCC) 10/21/2014   Pressure injury of skin 09/16/2020   Right ureteral stone 06/08/2022   Sepsis secondary to UTI (HCC) 06/08/2022   SOB (shortness of breath) 05/10/2022   Syncope and collapse 08/05/2022   Tobacco abuse 02/16/2022   Tobacco use disorder 02/16/2022   Type 2 diabetes mellitus (HCC) 06/08/2022   Visit for suture removal 06/15/2022   Past Surgical History:  Procedure Laterality Date   LEFT HEART CATH AND CORONARY ANGIOGRAPHY N/A 09/15/2020   Procedure: LEFT HEART CATH AND CORONARY ANGIOGRAPHY;  Surgeon: Tonny Bollman, MD;  Location: [facility] INVASIVE CV LAB;  Service: Cardiovascular;  Laterality: N/A;   LEG SURGERY Right    steal pin placed in right leg 35 years ago   ORIF FEMUR FRACTURE Right 09/15/2020   Procedure: OPEN REDUCTION INTERNAL FIXATION FEMORAL SHAFT FRACTURE;  Surgeon: Roby Lofts, MD;  Location: [facility] OR;  Service: Orthopedics;  Laterality:  Right;    Family History  Problem Relation Age of Onset   Cancer Mother    Diabetes Mother  Breast cancer Sister    Social History   Socioeconomic History   Marital status: Widowed    Spouse name: Not on file   Number of children: 3   Years of education: Not on file   Highest education level: High school graduate  Occupational History   Occupation: retired  Tobacco Use   Smoking status: Every Day    Current packs/day: 1.00    Average packs/day: 1 pack/day for 62.0 years (62.0 ttl pk-yrs)    Types: Cigarettes, Cigars   Smokeless tobacco: Never  Vaping Use   Vaping status: Never Used  Substance and Sexual Activity   Alcohol use: Yes    Alcohol/week: 5.0 standard drinks of alcohol    Types: 5 Standard drinks or equivalent per week    Comment: 4-5 beers per day   Drug use: No   Sexual activity: Not Currently  Other Topics Concern   Not on file  Social History Narrative   Not on file   Social Drivers of Health   Financial Resource Strain: Low Risk  (04/26/2022)   Overall Financial Resource Strain (CARDIA)    Difficulty of Paying Living Expenses: Not very hard  Food Insecurity: No Food Insecurity (03/09/2023)   Hunger Vital Sign    Worried About Running Out of Food in the Last Year: Never true    Ran Out of Food in the Last Year: Never true  Transportation Needs: No Transportation Needs (03/09/2023)   PRAPARE - Administrator, Civil Service (Medical): No    Lack of Transportation (Non-Medical): No  Physical Activity: Inactive (04/15/2022)   Exercise Vital Sign    Days of Exercise per Week: 0 days    Minutes of Exercise per Session: 0 min  Stress: No Stress Concern Present (04/15/2022)   [employer] of Occupational Health - Occupational Stress Questionnaire    Feeling of Stress : Not at all  Social Connections: Moderately Isolated (04/15/2022)   Social Connection and Isolation Panel [NHANES]    Frequency of Communication with Friends and Family: More  than three times a week    Frequency of Social Gatherings with Friends and Family: More than three times a week    Attends Religious Services: More than 4 times per year    Active Member of [employer] or Organizations: No    Attends Banker Meetings: Never    Marital Status: Widowed    Objective:  BP 132/60   Pulse 67   Temp 98 F (36.7 C)   Ht 5\' 9"  (1.753 m)   Wt 235 lb (106.6 kg)   SpO2 94%   BMI 34.70 kg/m      03/18/2023   10:32 AM 02/24/2023    9:26 AM 01/10/2023    9:40 AM  BP/Weight  Systolic BP 132 142 104  Diastolic BP 60 78 59  Wt. (Lbs) 235 252.6 229.2  BMI 34.7 kg/m2 37.3 kg/m2 35.9 kg/m2    Physical Exam Vitals reviewed.  Constitutional:      Appearance: Normal appearance.  Neck:     Vascular: No carotid bruit.  Cardiovascular:     Rate and Rhythm: Normal rate and regular rhythm.     Heart sounds: Normal heart sounds.  Pulmonary:     Effort: Pulmonary effort is normal.     Breath sounds: Normal breath sounds. No wheezing, rhonchi or rales.  Abdominal:     General: Bowel sounds are normal.     Palpations: Abdomen is soft.  Tenderness: There is no abdominal tenderness.  Musculoskeletal:     Right lower leg: No edema.     Left lower leg: No edema.  Neurological:     Mental Status: He is alert.  Psychiatric:        Mood and Affect: Mood normal.        Behavior: Behavior normal.     Diabetic Foot Exam - Simple   No data filed      Lab Results  Component Value Date   WBC 8.9 03/18/2023   HGB 13.2 03/18/2023   HCT 39.9 03/18/2023   PLT 294 03/18/2023   GLUCOSE 96 03/18/2023   CHOL 175 03/18/2023   TRIG 137 03/18/2023   HDL 67 03/18/2023   LDLCALC 84 03/18/2023   ALT 15 03/18/2023   AST 19 03/18/2023   NA 134 03/18/2023   K 4.6 03/18/2023   CL 94 (L) 03/18/2023   CREATININE 1.04 03/18/2023   BUN 10 03/18/2023   CO2 25 03/18/2023   TSH 8.360 (H) 03/18/2023   HGBA1C 6.1 (H) 03/18/2023      Assessment & Plan:     Chronic respiratory failure with hypoxia (HCC) Assessment & Plan: Continue 3 L PNC.  Currently recommend quit smoking.   Diabetic polyneuropathy associated with type 2 diabetes mellitus (HCC) Assessment & Plan: Control: well controlled Recommend annual eye exams. Medicines: Increase Jardiance to 25 mg daily.  Continue to work on eating a healthy diet and exercise.  Wash feet daily.   Check feet daily for sores.  Order diabetic shoes Recommend using stretchy diabetic socks to protect your feet and keep them clean. Labs drawn today.     Orders: -     Microalbumin / creatinine urine ratio -     CBC with Differential/Platelet -     Comprehensive metabolic panel -     Hemoglobin A1c  Primary hypertension Assessment & Plan: Well controlled. Continue toprol xl, spironolactone and Lasix.   Acquired hypothyroidism Assessment & Plan: TSH and free T4 today.  Abnormal.  Increase levothyroxine to 75 mcg once daily in am. Recheck tsh/free t4 in 6 weeks.   Orders: -     T4, free -     TSH  Mixed hyperlipidemia Assessment & Plan: Well controlled.  No changes to medicines. Atorvastatin 40 mg daily Continue to work on eating a healthy diet and exercise.  Labs drawn today.    Orders: -     Lipid panel  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 [facility]) Assessment & Plan: Continue trelegy one inhalation daily. albuterol HFA 4-5 times per day as needed, Oxygen 3 L. Sees pulmonology.    Paroxysmal atrial fibrillation [facility]) Assessment & Plan: Management per specialist Continue amiodarone and Eliquis.   Acute cystitis without hematuria Assessment & Plan: Order UA.  Orders: -     POCT URINALYSIS DIP (CLINITEK)     No orders of the defined types were placed in this encounter.   Orders Placed This Encounter  Procedures   Microalbumin / creatinine urine ratio   CBC with Differential/Platelet   Comprehensive metabolic panel   Hemoglobin A1c   Lipid panel   T4, free    TSH   POCT URINALYSIS DIP (CLINITEK)     Follow-up: No follow-ups on file.   I,Katherina A Bramblett,acting as a scribe for Blane Ohara, MD.,have documented all relevant documentation on the behalf of Blane Ohara, MD,as directed by  Blane Ohara, MD while in the presence of Blane Ohara, MD.  I,Marla I Leal-Borjas,acting as a scribe for Blane Ohara, MD.,have documented all relevant documentation on the behalf of Blane Ohara, MD,as directed by  Blane Ohara, MD while in the presence of Blane Ohara, MD.   An After Visit Summary was printed and given to the patient.  Blane Ohara, MD [facility] [phone]

## 2023-03-19 LAB — CBC WITH DIFFERENTIAL/PLATELET
Basophils Absolute: 0.1 10*3/uL (ref 0.0–0.2)
Basos: 1 %
EOS (ABSOLUTE): 0.1 10*3/uL (ref 0.0–0.4)
Eos: 1 %
Hematocrit: 39.9 % (ref 37.5–51.0)
Hemoglobin: 13.2 g/dL (ref 13.0–17.7)
Immature Grans (Abs): 0.1 10*3/uL (ref 0.0–0.1)
Immature Granulocytes: 1 %
Lymphocytes Absolute: 1.4 10*3/uL (ref 0.7–3.1)
Lymphs: 16 %
MCH: 31.7 pg (ref 26.6–33.0)
MCHC: 33.1 g/dL (ref 31.5–35.7)
MCV: 96 fL (ref 79–97)
Monocytes Absolute: 0.6 10*3/uL (ref 0.1–0.9)
Monocytes: 7 %
Neutrophils Absolute: 6.6 10*3/uL (ref 1.4–7.0)
Neutrophils: 74 %
Platelets: 294 10*3/uL (ref 150–450)
RBC: 4.16 x10E6/uL (ref 4.14–5.80)
RDW: 13.4 % (ref 11.6–15.4)
WBC: 8.9 10*3/uL (ref 3.4–10.8)

## 2023-03-19 LAB — COMPREHENSIVE METABOLIC PANEL
ALT: 15 IU/L (ref 0–44)
AST: 19 IU/L (ref 0–40)
Albumin: 4.2 g/dL (ref 3.7–4.7)
Alkaline Phosphatase: 100 IU/L (ref 44–121)
BUN/Creatinine Ratio: 10 (ref 10–24)
BUN: 10 mg/dL (ref 8–27)
Bilirubin Total: 0.5 mg/dL (ref 0.0–1.2)
CO2: 25 mmol/L (ref 20–29)
Calcium: 9.4 mg/dL (ref 8.6–10.2)
Chloride: 94 mmol/L — ABNORMAL LOW (ref 96–106)
Creatinine, Ser: 1.04 mg/dL (ref 0.76–1.27)
Globulin, Total: 2.6 g/dL (ref 1.5–4.5)
Glucose: 96 mg/dL (ref 70–99)
Potassium: 4.6 mmol/L (ref 3.5–5.2)
Sodium: 134 mmol/L (ref 134–144)
Total Protein: 6.8 g/dL (ref 6.0–8.5)
eGFR: 72 mL/min/{1.73_m2} (ref >59)

## 2023-03-19 LAB — HEMOGLOBIN A1C
Est. average glucose Bld gHb Est-mCnc: 128 mg/dL
Hgb A1c MFr Bld: 6.1 % — ABNORMAL HIGH (ref 4.8–5.6)

## 2023-03-19 LAB — LIPID PANEL
Chol/HDL Ratio: 2.6 ratio (ref 0.0–5.0)
Cholesterol, Total: 175 mg/dL (ref 100–199)
HDL: 67 mg/dL (ref >39)
LDL Chol Calc (NIH): 84 mg/dL (ref 0–99)
Triglycerides: 137 mg/dL (ref 0–149)
VLDL Cholesterol Cal: 24 mg/dL (ref 5–40)

## 2023-03-19 LAB — TSH: TSH: 8.36 u[IU]/mL — ABNORMAL HIGH (ref 0.450–4.500)

## 2023-03-19 LAB — T4, FREE: Free T4: 1.41 ng/dL (ref 0.82–1.77)

## 2023-03-20 DIAGNOSIS — N3000 Acute cystitis without hematuria: Secondary | ICD-10-CM | POA: Insufficient documentation

## 2023-03-20 LAB — MICROALBUMIN / CREATININE URINE RATIO
Creatinine, Urine: 19.1 mg/dL
Microalb/Creat Ratio: 63 mg/g{creat} — ABNORMAL HIGH (ref 0–29)
Microalbumin, Urine: 12.1 ug/mL

## 2023-03-20 NOTE — Assessment & Plan Note (Addendum)
 Well controlled. Continue toprol xl, spironolactone and Lasix.

## 2023-03-20 NOTE — Assessment & Plan Note (Signed)
Order UA

## 2023-03-20 NOTE — Assessment & Plan Note (Addendum)
 Management per specialist Continue amiodarone and Eliquis.

## 2023-03-20 NOTE — Assessment & Plan Note (Addendum)
 Control: well controlled Recommend annual eye exams. Medicines: Increase Jardiance to 25 mg daily.  Continue to work on eating a healthy diet and exercise.  Wash feet daily.   Check feet daily for sores.  Order diabetic shoes Recommend using stretchy diabetic socks to protect your feet and keep them clean. Labs drawn today.

## 2023-03-20 NOTE — Assessment & Plan Note (Signed)
Well controlled.  No changes to medicines. Atorvastatin 40 mg daily Continue to work on eating a healthy diet and exercise.  Labs drawn today.   

## 2023-03-20 NOTE — Assessment & Plan Note (Addendum)
 Continue trelegy one inhalation daily. albuterol HFA 4-5 times per day as needed, Oxygen 3 L. Sees pulmonology.

## 2023-03-20 NOTE — Assessment & Plan Note (Addendum)
 TSH and free T4 today.  Abnormal.  Increase levothyroxine to 75 mcg once daily in am. Recheck tsh/free t4 in 6 weeks.

## 2023-03-21 ENCOUNTER — Other Ambulatory Visit: Payer: Self-pay

## 2023-03-21 MED ORDER — LEVOTHYROXINE SODIUM 75 MCG PO TABS: 75.0000 ug | ORAL_TABLET | Freq: Every day | ORAL | 3 refills | Status: AC

## 2023-03-21 MED ORDER — EMPAGLIFLOZIN 25 MG PO TABS
25.0000 mg | ORAL_TABLET | Freq: Every day | ORAL | 0 refills | Status: DC
Start: 2023-03-21 — End: 2023-06-14

## 2023-03-23 ENCOUNTER — Ambulatory Visit: Payer: Medicare HMO | Admitting: Family Medicine

## 2023-03-24 ENCOUNTER — Other Ambulatory Visit: Payer: Self-pay | Admitting: *Deleted

## 2023-03-24 NOTE — Patient Outreach (Signed)
 Care Management   Visit Note  03/24/2023 Name: Kyle Farley MRN: [id number] DOB: [birth year]  Subjective: Kyle Farley is a 83 y.o. year old male who is a primary care patient of [name], Kirsten, MD. The Care Management team was consulted for assistance.      Engaged with patient spoke with patient by telephone.    Goals Addressed             This Visit's Progress    RNCM Care Management  Expected Outcome:  Monitor, Self-Manage and Reduce Symptoms of Afib       Current Barriers:  Chronic Disease Management support and education needs related to effective management of AFIB  Planned Interventions: Provider order and care plan reviewed.  Denies any changes Counseled on increased risk of stroke due to Afib and benefits of anticoagulation for stroke prevention           Reviewed importance of adherence to anticoagulant exactly as prescribed. Reports compliance with Eliquis Advised patient to discuss changes in AFIB, questions or concerns with provider Counseled on bleeding risk associated with AFIB and importance of self-monitoring for signs/symptoms of bleeding Counseled on avoidance of NSAIDs due to increased bleeding risk with anticoagulants Counseled on importance of regular laboratory monitoring as prescribed. Has labs on a regular basis.  Counseled on seeking medical attention after a head injury or if there is blood in the urine/stool. Review on being safe and monitoring for acute changes Afib action plan reviewed Screening for signs and symptoms of depression related to chronic disease state Assessed social determinant of health barriers  Symptom Management: Take medications as prescribed   Attend all scheduled provider appointments Call provider office for new concerns or questions  call the Suicide and Crisis Lifeline: 988 call the [place] National Suicide Prevention Lifeline: 9048152262 or TTY: 859-693-0620 TTY (503)606-5966) to talk to a trained counselor call  1-800-273-TALK (toll free, 24 hour hotline) go to [facility] [address], [place] [phone]) if experiencing a Mental Health or Behavioral Health Crisis  - make a plan to eat healthy - keep all lab appointments - take medicine as prescribed  Follow Up Plan: Telephone follow up appointment with care management team member scheduled for: 04-21-2023 at 1000 am     RNCM Care Management  Expected Outcome:  Monitor, Self-Manage, and Reduce Symptoms of Hypertension       Current Barriers:  Knowledge Deficits related to the importance of calling the providers when out of medications or changes in HTN or heart health Care Coordination needs related to pharmacy support and education, referral pending in a patient with HTN and other chronic conditions Chronic Disease Management support and education needs related to effective management of HTN BP Readings from Last 3 Encounters:  03/18/23 132/60  02/24/23 (!) 142/78  01/10/23 (!) 104/59     Planned Interventions: Evaluation of current treatment plan related to hypertension self management and patient's adherence to plan as established by provider.  Provided education to patient re: stroke prevention, s/s of heart attack and stroke; Education and support provided Reviewed prescribed diet heart healthy diet. States that he is working hard to follow a low sodium/heart healthy diet Reviewed medications with patient and discussed importance of compliance. Reports compliance with all medications Discussed plans with patient for ongoing care management follow up and provided patient with direct contact information for care management team; Advised patient, providing education and rationale, to monitor blood pressure daily and record, calling PCP for  findings outside established parameters; Continues to not monitor BP at home, reports normal BP at his prior office visit. RNCM provided education. Reviewed  scheduled/upcoming provider appointments including: 06-21-2023 at 10:00 am Advised patient to discuss changes in his HTN or  heart health with provider; Provided education on prescribed diet heart healthy diet;  Discussed complications of poorly controlled blood pressure such as heart disease, stroke, circulatory complications, vision complications, kidney impairment, sexual dysfunction;  Screening for signs and symptoms of depression related to chronic disease state;  Assessed social determinant of health barriers;   Symptom Management: Take medications as prescribed   Attend all scheduled provider appointments Call provider office for new concerns or questions  call the Suicide and Crisis Lifeline: 988 call the [place] National Suicide Prevention Lifeline: (514)433-3467 or TTY: 630-104-6973 TTY [phone]) to talk to a trained counselor call 1-800-273-TALK (toll free, 24 hour hotline) go to [facility] [address], [place] [phone]) if experiencing a Mental Health or Behavioral Health Crisis  check blood pressure weekly learn about high blood pressure call doctor for signs and symptoms of high blood pressure develop an action plan for high blood pressure keep all doctor appointments take medications for blood pressure exactly as prescribed report new symptoms to your doctor  Follow Up Plan: Telephone follow up appointment with care management team member scheduled for: 04-21-2023 at 1000 am      RNCM Care Management Expected Outcome:  Monitor, Self-Manage and Reduce Symptoms of Heart Failure       Current Barriers:  Knowledge Deficits related to the benefits of weighing daily to monitor for excess fluid and to prevent an exacerbation of HF Care Coordination needs related to education and support from pharm D for medication management and support  in a patient with HF Chronic Disease Management support and education needs related to effective  management of HF Wt Readings from Last 3 Encounters:  03/18/23 235 lb (106.6 kg)  02/24/23 252 lb 9.6 oz (114.6 kg)  01/10/23 229 lb 3.2 oz (104 kg)     Planned Interventions: Basic overview and discussion of pathophysiology of Heart Failure reviewed.  Provided education on low sodium diet.  Reviewed Heart Failure Action Plan in depth and provided written copy Assessed need for readable accurate scales in home. Reports daily weight of 234 lbs Discussed importance of daily weight and advised patient to weigh and record daily.  RNCM provided reminder to patient about notifying his provider when his weight is up 2 lbs in a day or 5 lbs in a week.  Reviewed role of diuretics in prevention of fluid overload and management of heart failure.  Discussed the importance of keeping all appointments with provider Advised patient to discuss changes in fluid balance, changes in HF, questions, or concerns with provider Screening for signs and symptoms of depression related to chronic disease state  Assessed social determinant of health barriers   Symptom Management: Take medications as prescribed   Attend all scheduled provider appointments Call provider office for new concerns or questions  call the Suicide and Crisis Lifeline: 988 call the [place] National Suicide Prevention Lifeline: 269-318-0431 or TTY: 714-119-2965 TTY [phone]) to talk to a trained counselor call 1-800-273-TALK (toll free, 24 hour hotline) go to [facility] [address] [place] [phone]) if experiencing a Mental Health or Behavioral Health Crisis  call office if I gain more than 2 pounds in one day or 5 pounds in one week use salt  in moderation watch for swelling in feet, ankles and legs every day weigh myself daily develop a rescue plan follow rescue plan if symptoms flare-up  Follow Up Plan: Telephone follow up appointment with care management team member scheduled for:  04-21-2023 at 10:00 am       RNCM Care Management:  Maintain, Monitor and Self-Manage Symptoms of COPD       Current Barriers:  Knowledge Deficits related to the importance of following the plan of care and taking medications as directed for effective management of COPD Care Coordination needs related to pharmacy needs  in a patient with COPD Chronic Disease Management support and education needs related to effective management of COPD Current every day smoker of 2 PPD  Uses oxygen 3 liters at night, sleeps in a recliner due to inability to lay down because of shortness of breath  Planned Interventions: Provided patient with basic written and verbal COPD education on self care/management/and exacerbation prevention. Patient reports some mild dyspnea today. Reports his oxygen saturation was 90% this morning. Advised patient to track and manage COPD triggers. Review of monitoring for changes that may exacerbate his COPD. The patient is aware and knows to watch for acute changes.  Provided written and verbal instructions on pursed lip breathing and utilized returned demonstration as teach back Provided instruction about proper use of medications used for management of COPD including inhalers.  RNCM PROVIDED REMINDER: ADVISED PATIENT TO SELF ASSESSES COPD ACTION PLAN ZONE AND MAKE APPOINTMENT WITH PROVIDER IF IN THE YELLOW ZONE FOR 48 HOURS WITHOUT IMPROVEMENT Advised patient to engage in light exercise as tolerated 3-5 days a week to aid in the the management of COPD Provided education about and advised patient to utilize infection prevention strategies to reduce risk of respiratory infection Discussed the importance of adequate rest and management of fatigue with COPD Screening for signs and symptoms of depression related to chronic disease state  Assessed social determinant of health barriers  Symptom Management: Take medications as prescribed   Attend all scheduled provider appointments Call  provider office for new concerns or questions  call the Suicide and Crisis Lifeline: 988 call the [place] National Suicide Prevention Lifeline: 7854214480 or TTY: 770-037-5125 TTY [phone]) to talk to a trained counselor call 1-800-273-TALK (toll free, 24 hour hotline) go to [facility] [address] [place] [phone]) if experiencing a Mental Health or Behavioral Health Crisis  identify and remove indoor air pollutants limit outdoor activity during cold weather listen for public air quality announcements every day develop a rescue plan eliminate symptom triggers at home follow rescue plan if symptoms flare-up  Follow Up Plan: Telephone follow up appointment with care management team member scheduled for: 04-21-2023 at 10:00 am            Consent to Services:  Patient was given information about care management services, agreed to services, and gave verbal consent to participate.   Plan: Telephone follow up appointment with care management team member scheduled for:04-21-2023 at 10:00 am  Larey Brick, BSN RN [facility], [facility] RN Care Manager Direct Dial: [phone]  Fax: [phone]

## 2023-03-24 NOTE — Patient Instructions (Signed)
 Visit Information  Thank you for taking time to visit with me today. Please don't hesitate to contact me if I can be of assistance to you before our next scheduled telephone appointment.  Following are the goals we discussed today:   Goals Addressed             This Visit's Progress    RNCM Care Management  Expected Outcome:  Monitor, Self-Manage and Reduce Symptoms of Afib       Current Barriers:  Chronic Disease Management support and education needs related to effective management of AFIB  Planned Interventions: Provider order and care plan reviewed.  Denies any changes Counseled on increased risk of stroke due to Afib and benefits of anticoagulation for stroke prevention           Reviewed importance of adherence to anticoagulant exactly as prescribed. Reports compliance with Eliquis Advised patient to discuss changes in AFIB, questions or concerns with provider Counseled on bleeding risk associated with AFIB and importance of self-monitoring for signs/symptoms of bleeding Counseled on avoidance of NSAIDs due to increased bleeding risk with anticoagulants Counseled on importance of regular laboratory monitoring as prescribed. Has labs on a regular basis.  Counseled on seeking medical attention after a head injury or if there is blood in the urine/stool. Review on being safe and monitoring for acute changes Afib action plan reviewed Screening for signs and symptoms of depression related to chronic disease state Assessed social determinant of health barriers  Symptom Management: Take medications as prescribed   Attend all scheduled provider appointments Call provider office for new concerns or questions  call the Suicide and Crisis Lifeline: 988 call the [place] National Suicide Prevention Lifeline: 910 349 5145 or TTY: (650)565-4080 TTY (989)618-3647) to talk to a trained counselor call 1-800-273-TALK (toll free, 24 hour hotline) go to [facility] [address], [place] [phone]) if experiencing a Mental Health or Behavioral Health Crisis  - make a plan to eat healthy - keep all lab appointments - take medicine as prescribed  Follow Up Plan: Telephone follow up appointment with care management team member scheduled for: 04-21-2023 at 1000 am     RNCM Care Management  Expected Outcome:  Monitor, Self-Manage, and Reduce Symptoms of Hypertension       Current Barriers:  Knowledge Deficits related to the importance of calling the providers when out of medications or changes in HTN or heart health Care Coordination needs related to pharmacy support and education, referral pending in a patient with HTN and other chronic conditions Chronic Disease Management support and education needs related to effective management of HTN BP Readings from Last 3 Encounters:  03/18/23 132/60  02/24/23 (!) 142/78  01/10/23 (!) 104/59     Planned Interventions: Evaluation of current treatment plan related to hypertension self management and patient's adherence to plan as established by provider.  Provided education to patient re: stroke prevention, s/s of heart attack and stroke; Education and support provided Reviewed prescribed diet heart healthy diet. States that he is working hard to follow a low sodium/heart healthy diet Reviewed medications with patient and discussed importance of compliance. Reports compliance with all medications Discussed plans with patient for ongoing care management follow up and provided patient with direct contact information for care management team; Advised patient, providing education and rationale, to monitor blood pressure daily and record, calling PCP for findings outside established parameters; Continues to not monitor BP at home, reports normal BP at his prior office visit.  RNCM provided education. Reviewed scheduled/upcoming provider appointments including: 06-21-2023 at 10:00 am Advised patient to discuss  changes in his HTN or  heart health with provider; Provided education on prescribed diet heart healthy diet;  Discussed complications of poorly controlled blood pressure such as heart disease, stroke, circulatory complications, vision complications, kidney impairment, sexual dysfunction;  Screening for signs and symptoms of depression related to chronic disease state;  Assessed social determinant of health barriers;   Symptom Management: Take medications as prescribed   Attend all scheduled provider appointments Call provider office for new concerns or questions  call the Suicide and Crisis Lifeline: 988 call the [place] National Suicide Prevention Lifeline: 540-544-1328 or TTY: 7188700112 TTY (937)855-8160) to talk to a trained counselor call 1-800-273-TALK (toll free, 24 hour hotline) go to [facility] [address], [place] [phone]) if experiencing a Mental Health or Behavioral Health Crisis  check blood pressure weekly learn about high blood pressure call doctor for signs and symptoms of high blood pressure develop an action plan for high blood pressure keep all doctor appointments take medications for blood pressure exactly as prescribed report new symptoms to your doctor  Follow Up Plan: Telephone follow up appointment with care management team member scheduled for: 04-21-2023 at 1000 am      RNCM Care Management Expected Outcome:  Monitor, Self-Manage and Reduce Symptoms of Heart Failure       Current Barriers:  Knowledge Deficits related to the benefits of weighing daily to monitor for excess fluid and to prevent an exacerbation of HF Care Coordination needs related to education and support from pharm D for medication management and support  in a patient with HF Chronic Disease Management support and education needs related to effective management of HF Wt Readings from Last 3 Encounters:  03/18/23 235 lb (106.6 kg)  02/24/23 252 lb  9.6 oz (114.6 kg)  01/10/23 229 lb 3.2 oz (104 kg)     Planned Interventions: Basic overview and discussion of pathophysiology of Heart Failure reviewed.  Provided education on low sodium diet.  Reviewed Heart Failure Action Plan in depth and provided written copy Assessed need for readable accurate scales in home. Reports daily weight of 234 lbs Discussed importance of daily weight and advised patient to weigh and record daily.  RNCM provided reminder to patient about notifying his provider when his weight is up 2 lbs in a day or 5 lbs in a week.  Reviewed role of diuretics in prevention of fluid overload and management of heart failure.  Discussed the importance of keeping all appointments with provider Advised patient to discuss changes in fluid balance, changes in HF, questions, or concerns with provider Screening for signs and symptoms of depression related to chronic disease state  Assessed social determinant of health barriers   Symptom Management: Take medications as prescribed   Attend all scheduled provider appointments Call provider office for new concerns or questions  call the Suicide and Crisis Lifeline: 988 call the [place] National Suicide Prevention Lifeline: (628)557-1535 or TTY: 682 357 7637 TTY [phone]) to talk to a trained counselor call 1-800-273-TALK (toll free, 24 hour hotline) go to [facility] [address] [place] [phone]) if experiencing a Mental Health or Behavioral Health Crisis  call office if I gain more than 2 pounds in one day or 5 pounds in one week use salt in moderation watch for swelling in feet, ankles and legs every day weigh myself daily develop a rescue plan  follow rescue plan if symptoms flare-up  Follow Up Plan: Telephone follow up appointment with care management team member scheduled for: 04-21-2023 at 10:00 am       RNCM Care Management:  Maintain, Monitor and Self-Manage Symptoms of  COPD       Current Barriers:  Knowledge Deficits related to the importance of following the plan of care and taking medications as directed for effective management of COPD Care Coordination needs related to pharmacy needs  in a patient with COPD Chronic Disease Management support and education needs related to effective management of COPD Current every day smoker of 2 PPD  Uses oxygen 3 liters at night, sleeps in a recliner due to inability to lay down because of shortness of breath  Planned Interventions: Provided patient with basic written and verbal COPD education on self care/management/and exacerbation prevention. Patient reports some mild dyspnea today. Reports his oxygen saturation was 90% this morning. Advised patient to track and manage COPD triggers. Review of monitoring for changes that may exacerbate his COPD. The patient is aware and knows to watch for acute changes.  Provided written and verbal instructions on pursed lip breathing and utilized returned demonstration as teach back Provided instruction about proper use of medications used for management of COPD including inhalers.  RNCM PROVIDED REMINDER: ADVISED PATIENT TO SELF ASSESSES COPD ACTION PLAN ZONE AND MAKE APPOINTMENT WITH PROVIDER IF IN THE YELLOW ZONE FOR 48 HOURS WITHOUT IMPROVEMENT Advised patient to engage in light exercise as tolerated 3-5 days a week to aid in the the management of COPD Provided education about and advised patient to utilize infection prevention strategies to reduce risk of respiratory infection Discussed the importance of adequate rest and management of fatigue with COPD Screening for signs and symptoms of depression related to chronic disease state  Assessed social determinant of health barriers  Symptom Management: Take medications as prescribed   Attend all scheduled provider appointments Call provider office for new concerns or questions  call the Suicide and Crisis Lifeline: 988 call the  [place] National Suicide Prevention Lifeline: 401-505-4752 or TTY: 4421125972 TTY [phone]) to talk to a trained counselor call 1-800-273-TALK (toll free, 24 hour hotline) go to [facility] [address] [place] [phone]) if experiencing a Mental Health or Behavioral Health Crisis  identify and remove indoor air pollutants limit outdoor activity during cold weather listen for public air quality announcements every day develop a rescue plan eliminate symptom triggers at home follow rescue plan if symptoms flare-up  Follow Up Plan: Telephone follow up appointment with care management team member scheduled for: 04-21-2023 at 10:00 am           Our next appointment is by telephone on 04-21-2023 at 10:00 am  Please call the care guide team at [phone] if you need to cancel or reschedule your appointment.   If you are experiencing a Mental Health or Behavioral Health Crisis or need someone to talk to, please call the Suicide and Crisis Lifeline: 988 call the [place] National Suicide Prevention Lifeline: (925)403-0653 or TTY: 762-472-5170 TTY 5147660821) to talk to a trained counselor call 1-800-273-TALK (toll free, 24 hour hotline)   The patient verbalized understanding of instructions, educational materials, and care plan provided today and DECLINED offer to receive copy of patient instructions, educational materials, and care plan.   Telephone follow up appointment with care management team member scheduled for:04-21-2023 at 10:00 am  Larey Brick, BSN RN [facility], [employer]  Health RN Care Manager Direct Dial: [phone]  Fax: [phone]

## 2023-03-29 ENCOUNTER — Telehealth: Payer: Self-pay

## 2023-03-29 NOTE — Telephone Encounter (Signed)
 Dm shoe ppwk expires 5/28

## 2023-03-29 NOTE — Telephone Encounter (Signed)
 Dm shoes are in [employer]

## 2023-04-03 ENCOUNTER — Encounter: Payer: Self-pay | Admitting: Family Medicine

## 2023-04-03 NOTE — Assessment & Plan Note (Signed)
 Continue 3 L [employer].  Currently recommend quit smoking.

## 2023-04-04 ENCOUNTER — Encounter: Payer: Self-pay | Admitting: Podiatry

## 2023-04-04 ENCOUNTER — Ambulatory Visit (INDEPENDENT_AMBULATORY_CARE_PROVIDER_SITE_OTHER): Payer: PPO | Admitting: Podiatry

## 2023-04-04 DIAGNOSIS — M2142 Flat foot [pes planus] (acquired), left foot: Secondary | ICD-10-CM | POA: Diagnosis not present

## 2023-04-04 DIAGNOSIS — R600 Localized edema: Secondary | ICD-10-CM

## 2023-04-04 DIAGNOSIS — B351 Tinea unguium: Secondary | ICD-10-CM

## 2023-04-04 DIAGNOSIS — E1142 Type 2 diabetes mellitus with diabetic polyneuropathy: Secondary | ICD-10-CM

## 2023-04-04 DIAGNOSIS — M2141 Flat foot [pes planus] (acquired), right foot: Secondary | ICD-10-CM | POA: Diagnosis not present

## 2023-04-04 NOTE — Progress Notes (Unsigned)
  Subjective:  Patient ID: EAVEN SCHWAGER, male    DOB: 09-Oct-1940,  MRN: [id number]  Chief Complaint  Patient presents with   [facility]    [facility] today no callous. Last A1c was in Feb it was 6.1 and tales elliquis    83 y.o. male presents with the above complaint. History confirmed with patient. Patient presenting with pain related to dystrophic thickened elongated nails. Patient is unable to trim own nails related to nail dystrophy and/or mobility issues. Patient does  have a history of T2DM.  Last A1c 6.1.  Is on chronic Eliquis.  Getting fitted for diabetic shoes today.  Objective:  Physical exam: Warm, cap refill approximately 3 seconds to the digits, pedal hair growth absent.  Pedal skin atrophic, xerotic.  Webspaces clean and dry nail exam onychomycosis of the toenails, onycholysis, and dystrophic nails, within normal limits for length today. DP pulses palpable, protective sensation absent, and vibratory sensation absent, PT pulses faintly palpable.  Mild +1 pitting edema Left Foot:  Pain with palpation of nails due to elongation and dystrophic growth.  Right Foot: Pain with palpation of nails due to elongation and dystrophic growth.  Assessment:   1. Diabetic polyneuropathy associated with type 2 diabetes mellitus (HCC)   2. Pain due to onychomycosis of toenails of both feet      Plan:  Patient was evaluated and treated and all questions answered.  # Diabetic neuropathy -Patient had adverse reaction to gabapentin causing leg swelling which led to hospitalization as documented in previous visits. -Did have been trying over-the-counter capsaicin cream, has not noticed much of a difference -Believe the patient will benefit from Qutenza.  I instructed the patient to fax a referral from his primary care physician stating medical necessity. Patient educated on diabetes. Discussed proper diabetic foot care and discussed risks and complications of disease. Educated patient in depth on reasons  to return to the office immediately should he/she discover anything concerning or new on the feet. All questions answered. Discussed proper shoes as well.  -Patient is getting fitted for his diabetic shoes today.   #Onychomycosis with pain  -Nails palliatively debrided as below. -Educated on self-care  Procedure: Nail Debridement Rationale: Pain Type of Debridement: manual, sharp debridement. Instrumentation: Nail nipper, rotary burr. Number of Nails: 10  Return in about 3 months (around 07/05/2023) for Diabetic Foot Care.         Bronwen Betters, DPM [facility] / [facility]

## 2023-04-04 NOTE — Patient Instructions (Signed)
 We think that we can get you approved for Qutenza for your neuropathy.  This will require a referral from your primary care doctor or managing diabetes doctor.  Please have them send or fax a referral to our office for Qutenza treatment.

## 2023-04-06 ENCOUNTER — Telehealth: Payer: Self-pay

## 2023-04-06 NOTE — Telephone Encounter (Signed)
 Porfirio Oar AUTH # [id number] VALID THRU 03/30/23-06/28/23

## 2023-04-18 ENCOUNTER — Ambulatory Visit: Payer: Self-pay

## 2023-04-18 NOTE — Telephone Encounter (Signed)
 Per Dr. Faylene Kurtz, patient needs to go to the ED. Spoke with Jerilynn Som, patients son, informed him patient needs to go to the ED as our office does not have the resources he may require to be treated.

## 2023-04-18 NOTE — Telephone Encounter (Signed)
 Chief Complaint: Shortness of Breath Symptoms: cough, BLE swelling, CP Frequency: Worsening x 4 days Pertinent Negatives: Patient denies fever Disposition: [x] ED /[] Urgent Care (no appt availability in office) / [] Appointment(In office/virtual)/ []  [place] [facility]/ [] Home Care/ [] Refused Recommended Disposition /[] [place] [facility]/ []  Follow-up with PCP Additional Notes: Pt's son reports pt has been experiencing increased SOB with SpO2 dropping to 70% when he stands, increased bilateral leg swelling, CP at night that he is treating with Nitroglycerin and reports it "eases it off". Notes cough, wheezing. Pt reports "if I get worse I will go to ER". Pt states he would like an appt, this RN provided education, continues to decline. CAL notified. This RN educated pt on home care, new-worsening symptoms, when to call back/seek emergent care. Pt verbalized understanding and agrees to plan.   Copied from CRM [id number]. Topic: Clinical - Red Word Triage >> Apr 18, 2023  3:08 PM Nyra Capes wrote: Red Word that prompted transfer to Nurse Triage: Patient son calling in, [name], Patient is having a hard time breathing, sounds gravely, coughing    O2 90 sitting if he stands and walks it drops to 70  feet are swollen and can't put shoes on today yesterday he could. Reason for Disposition  Oxygen level (e.g., pulse oximetry) 90 percent or lower  Answer Assessment - Initial Assessment Questions 1. RESPIRATORY STATUS: "Describe your breathing?" (e.g., wheezing, shortness of breath, unable to speak, severe coughing)      Wheezing, SOB with exertion/rest, takes a while to resolve 2. ONSET: "When did this breathing problem begin?"      X 4 days 3. PATTERN "Does the difficult breathing come and go, or has it been constant since it started?"      Constant 4. SEVERITY: "How bad is your breathing?" (e.g., mild, moderate, severe)    - MILD: No SOB at rest, mild SOB with walking, speaks normally in sentences,  can lie down, no retractions, pulse < 100.    - MODERATE: SOB at rest, SOB with minimal exertion and prefers to sit, cannot lie down flat, speaks in phrases, mild retractions, audible wheezing, pulse 100-120.    - SEVERE: Very SOB at rest, speaks in single words, struggling to breathe, sitting hunched forward, retractions, pulse > 120      Moderate 5. RECURRENT SYMPTOM: "Have you had difficulty breathing before?" If Yes, ask: "When was the last time?" and "What happened that time?"      Yes 6. CARDIAC HISTORY: "Do you have any history of heart disease?" (e.g., heart attack, angina, bypass surgery, angioplasty)      A-Fib 7. LUNG HISTORY: "Do you have any history of lung disease?"  (e.g., pulmonary embolus, asthma, emphysema)     COPD 8. CAUSE: "What do you think is causing the breathing problem?"      COPD exacerbation 9. OTHER SYMPTOMS: "Do you have any other symptoms? (e.g., dizziness, runny nose, cough, chest pain, fever)     Cough,CP 10. O2 SATURATION MONITOR:  "Do you use an oxygen saturation monitor (pulse oximeter) at home?" If Yes, ask: "What is your reading (oxygen level) today?" "What is your usual oxygen saturation reading?" (e.g., 95%)       90%, 75% with standing/walking  Protocols used: Breathing Difficulty-A-AH

## 2023-04-19 ENCOUNTER — Other Ambulatory Visit: Payer: Self-pay | Admitting: Family Medicine

## 2023-04-19 DIAGNOSIS — J449 Chronic obstructive pulmonary disease, unspecified: Secondary | ICD-10-CM

## 2023-04-21 ENCOUNTER — Telehealth: Payer: Self-pay | Admitting: *Deleted

## 2023-04-21 ENCOUNTER — Other Ambulatory Visit: Payer: Self-pay | Admitting: *Deleted

## 2023-04-21 NOTE — Patient Outreach (Signed)
  Care Management   Follow Up Note   04/21/2023 Name: Kyle Farley MRN: [id number] DOB: 1941-01-07   Referred by: Blane Ohara, MD Reason for referral : Care Management (RNCM: ATTEMPTED Follow Up For Chronic Disease Management & Care Coordination Needs/)   An unsuccessful telephone outreach was attempted today. The patient was referred to the case management team for assistance with care management and care coordination.   Follow Up Plan: The care management team will reach out to the patient again over the next 30 days.   Danise Edge, BSN RN [facility], [facility] RN Care Manager Direct Dial: [phone]  Fax: [phone]

## 2023-04-24 ENCOUNTER — Other Ambulatory Visit: Payer: Self-pay | Admitting: Family Medicine

## 2023-04-24 DIAGNOSIS — J449 Chronic obstructive pulmonary disease, unspecified: Secondary | ICD-10-CM

## 2023-04-28 ENCOUNTER — Other Ambulatory Visit: Payer: Self-pay | Admitting: Family Medicine

## 2023-04-28 DIAGNOSIS — J449 Chronic obstructive pulmonary disease, unspecified: Secondary | ICD-10-CM

## 2023-05-01 ENCOUNTER — Other Ambulatory Visit: Payer: Self-pay

## 2023-05-01 DIAGNOSIS — E039 Hypothyroidism, unspecified: Secondary | ICD-10-CM

## 2023-05-02 ENCOUNTER — Other Ambulatory Visit

## 2023-05-02 DIAGNOSIS — E039 Hypothyroidism, unspecified: Secondary | ICD-10-CM

## 2023-05-03 LAB — TSH: TSH: 4.39 u[IU]/mL (ref 0.450–4.500)

## 2023-05-03 LAB — T4, FREE: Free T4: 1.49 ng/dL (ref 0.82–1.77)

## 2023-05-05 ENCOUNTER — Other Ambulatory Visit: Payer: Self-pay

## 2023-05-05 DIAGNOSIS — I480 Paroxysmal atrial fibrillation: Secondary | ICD-10-CM

## 2023-05-05 MED ORDER — APIXABAN 5 MG PO TABS: 5.0000 mg | ORAL_TABLET | Freq: Two times a day (BID) | ORAL | 3 refills | Status: AC

## 2023-05-05 NOTE — Progress Notes (Signed)
 When labs were called, patient stated he was out of his Eliquis and had been out for three weeks. I sent a refill in to pharmacy on file.

## 2023-05-10 DIAGNOSIS — R9431 Abnormal electrocardiogram [ECG] [EKG]: Secondary | ICD-10-CM | POA: Diagnosis not present

## 2023-05-10 DIAGNOSIS — E039 Hypothyroidism, unspecified: Secondary | ICD-10-CM | POA: Diagnosis not present

## 2023-05-10 DIAGNOSIS — E871 Hypo-osmolality and hyponatremia: Secondary | ICD-10-CM | POA: Diagnosis not present

## 2023-05-10 DIAGNOSIS — I110 Hypertensive heart disease with heart failure: Secondary | ICD-10-CM | POA: Diagnosis not present

## 2023-05-10 DIAGNOSIS — J9621 Acute and chronic respiratory failure with hypoxia: Secondary | ICD-10-CM | POA: Diagnosis not present

## 2023-05-10 DIAGNOSIS — I480 Paroxysmal atrial fibrillation: Secondary | ICD-10-CM | POA: Diagnosis not present

## 2023-05-10 DIAGNOSIS — Z7952 Long term (current) use of systemic steroids: Secondary | ICD-10-CM | POA: Diagnosis not present

## 2023-05-10 DIAGNOSIS — I2510 Atherosclerotic heart disease of native coronary artery without angina pectoris: Secondary | ICD-10-CM | POA: Diagnosis not present

## 2023-05-10 DIAGNOSIS — G4700 Insomnia, unspecified: Secondary | ICD-10-CM | POA: Diagnosis not present

## 2023-05-10 DIAGNOSIS — J9611 Chronic respiratory failure with hypoxia: Secondary | ICD-10-CM | POA: Diagnosis not present

## 2023-05-10 DIAGNOSIS — M1990 Unspecified osteoarthritis, unspecified site: Secondary | ICD-10-CM | POA: Diagnosis not present

## 2023-05-10 DIAGNOSIS — J441 Chronic obstructive pulmonary disease with (acute) exacerbation: Secondary | ICD-10-CM | POA: Diagnosis not present

## 2023-05-10 DIAGNOSIS — Z7901 Long term (current) use of anticoagulants: Secondary | ICD-10-CM | POA: Diagnosis not present

## 2023-05-10 DIAGNOSIS — F419 Anxiety disorder, unspecified: Secondary | ICD-10-CM | POA: Diagnosis not present

## 2023-05-10 DIAGNOSIS — I510 Cardiac septal defect, acquired: Secondary | ICD-10-CM | POA: Diagnosis not present

## 2023-05-10 DIAGNOSIS — Z9981 Dependence on supplemental oxygen: Secondary | ICD-10-CM | POA: Diagnosis not present

## 2023-05-10 DIAGNOSIS — I5033 Acute on chronic diastolic (congestive) heart failure: Secondary | ICD-10-CM | POA: Diagnosis not present

## 2023-05-10 DIAGNOSIS — F17210 Nicotine dependence, cigarettes, uncomplicated: Secondary | ICD-10-CM | POA: Diagnosis not present

## 2023-05-10 DIAGNOSIS — D649 Anemia, unspecified: Secondary | ICD-10-CM | POA: Diagnosis not present

## 2023-05-10 DIAGNOSIS — Z79899 Other long term (current) drug therapy: Secondary | ICD-10-CM | POA: Diagnosis not present

## 2023-05-10 DIAGNOSIS — E7800 Pure hypercholesterolemia, unspecified: Secondary | ICD-10-CM | POA: Diagnosis not present

## 2023-05-10 DIAGNOSIS — R609 Edema, unspecified: Secondary | ICD-10-CM | POA: Diagnosis not present

## 2023-05-10 DIAGNOSIS — E119 Type 2 diabetes mellitus without complications: Secondary | ICD-10-CM | POA: Diagnosis not present

## 2023-05-10 DIAGNOSIS — I509 Heart failure, unspecified: Secondary | ICD-10-CM | POA: Diagnosis not present

## 2023-05-10 DIAGNOSIS — I498 Other specified cardiac arrhythmias: Secondary | ICD-10-CM | POA: Diagnosis not present

## 2023-05-10 DIAGNOSIS — I4510 Unspecified right bundle-branch block: Secondary | ICD-10-CM | POA: Diagnosis not present

## 2023-05-10 DIAGNOSIS — A419 Sepsis, unspecified organism: Secondary | ICD-10-CM | POA: Diagnosis not present

## 2023-05-11 ENCOUNTER — Other Ambulatory Visit: Payer: Self-pay | Admitting: Family Medicine

## 2023-05-11 ENCOUNTER — Other Ambulatory Visit: Payer: Self-pay | Admitting: Cardiology

## 2023-05-16 NOTE — Telephone Encounter (Signed)
 Carliceia can you reschedule this missed appointment with Clarnce Crow RNCM   Thank you   Barnie Bora  [facility]  [facility], [facility] Guide  Direct Dial: [phone]  Fax [phone]

## 2023-05-18 NOTE — Telephone Encounter (Signed)
 Rx refill sent to pharmacy.

## 2023-05-19 ENCOUNTER — Telehealth: Payer: Self-pay

## 2023-05-19 NOTE — Progress Notes (Signed)
 Complex Care Management Note  Care Guide Note 05/19/2023 Name: Kyle Farley MRN: [id number] DOB: 19-Nov-1940  Kyle Farley is a 83 y.o. year old male who sees [name], Kirsten, MD for primary care. I reached out to Jena Minor by phone today to offer complex care management services.  Kyle Farley was given information about Complex Care Management services today including:   The Complex Care Management services include support from the care team which includes your Nurse Care Manager, Clinical Social Worker, or Pharmacist.  The Complex Care Management team is here to help remove barriers to the health concerns and goals most important to you. Complex Care Management services are voluntary, and the patient may decline or stop services at any time by request to their care team member.   Complex Care Management Consent Status: Patient agreed to services and verbal consent obtained.   Follow up plan:  Telephone appointment with complex care management team member scheduled for:  06/02/23 at 2:00 p.m.   Encounter Outcome:  Patient Scheduled  [facility]  [facility], [facility] Care Management Assistant Direct Dial: [phone]  Fax: [phone]

## 2023-05-19 NOTE — Telephone Encounter (Signed)
-----   Message from Nurse Elinor Guardian S sent at 05/16/2023  9:25 AM EDT -----    ----- Message ----- From: Remona Carmel, RN Sent: 04/21/2023   9:48 AM EDT To: Dino Frank, NT  Earnstine Golas, Unable to reach patient, please reschedule. Thanks, Odilia Bennett

## 2023-05-20 ENCOUNTER — Ambulatory Visit (INDEPENDENT_AMBULATORY_CARE_PROVIDER_SITE_OTHER): Admitting: Physician Assistant

## 2023-05-20 ENCOUNTER — Encounter: Payer: Self-pay | Admitting: Physician Assistant

## 2023-05-20 VITALS — BP 108/60 | HR 60 | Temp 98.5°F | Resp 24 | Ht 69.0 in | Wt 235.0 lb

## 2023-05-20 DIAGNOSIS — Z7951 Long term (current) use of inhaled steroids: Secondary | ICD-10-CM

## 2023-05-20 DIAGNOSIS — R600 Localized edema: Secondary | ICD-10-CM

## 2023-05-20 DIAGNOSIS — I10 Essential (primary) hypertension: Secondary | ICD-10-CM

## 2023-05-20 DIAGNOSIS — I7143 Infrarenal abdominal aortic aneurysm, without rupture: Secondary | ICD-10-CM

## 2023-05-20 DIAGNOSIS — J449 Chronic obstructive pulmonary disease, unspecified: Secondary | ICD-10-CM | POA: Diagnosis not present

## 2023-05-20 DIAGNOSIS — D638 Anemia in other chronic diseases classified elsewhere: Secondary | ICD-10-CM | POA: Diagnosis not present

## 2023-05-20 DIAGNOSIS — I480 Paroxysmal atrial fibrillation: Secondary | ICD-10-CM

## 2023-05-20 DIAGNOSIS — J441 Chronic obstructive pulmonary disease with (acute) exacerbation: Secondary | ICD-10-CM | POA: Diagnosis not present

## 2023-05-20 MED ORDER — SULFAMETHOXAZOLE-TRIMETHOPRIM 800-160 MG PO TABS: 1.0000 | ORAL_TABLET | Freq: Two times a day (BID) | ORAL | 0 refills | Status: DC

## 2023-05-20 NOTE — Patient Instructions (Signed)
 VISIT SUMMARY:  You had a follow-up appointment today after your recent hospital stay. We discussed your ongoing issues with COPD, oxygen  dependence, fluid retention, and hyponatremia. We also reviewed your medications and allergies, and talked about your goals of care.  YOUR PLAN:  -CHRONIC OBSTRUCTIVE PULMONARY DISEASE (COPD): COPD is a chronic lung condition that makes it hard to breathe. We are considering starting you on a low-dose daily prednisone  to help with your breathing, and you will be monitored for any changes in your blood sugar levels. You will also take a low-dose Augmentin for 5 days to prevent infection. We will consult with Dr. Reinhold Carbine about the prednisone  and look into getting you a home-use machine similar to the one you used in the hospital. Make sure you have enough nebulizer supplies at home.  -OXYGEN  DEPENDENCE: You need to use oxygen  at night and experience shortness of breath when you move around. It's important to stay active and avoid sitting for long periods. We will ensure you have enough oxygen  supply and encourage you to be as mobile as possible, including spending time outdoors if you can.  -FLUID RETENTION AND EDEMA: Fluid retention can cause swelling in your legs. Your leg swelling has improved since your hospital stay, but we need to keep an eye on it. We will monitor your fluid retention and adjust your Lasix  dosage as needed. A blood test will be done to check your blood count and iron  levels.  -HYPONATREMIA: Hyponatremia means you have low sodium levels in your blood. This can affect your heart function. We will order a blood test to check your sodium levels.  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 There have been no recent changes.  -ALLERGY TO GABAPENTIN : You are allergic to gabapentin , which caused fluid retention. This medication has been discontinued by your cardiologist.  -GOALS OF CARE: We  discussed your breathing difficulties and the potential need for a breathing assistance machine. You prefer not to use a rebreather mask at night. We also talked about your past inquiry regarding a living will.  INSTRUCTIONS:  1. Take low-dose Augmentin for 5 days as prescribed. 2. Consult with Dr. Reinhold Carbine about starting low-dose daily prednisone . 3. Ensure you have enough nebulizer supplies at home. 4. Stay active and avoid sitting for long periods; spend time outdoors if possible. 5. Monitor your fluid retention and adjust Lasix  dosage as needed. 6. Get a blood test to check your blood count, iron  levels, and sodium levels. 7. Follow up with your doctor as needed.

## 2023-05-20 NOTE — Progress Notes (Unsigned)
 Subjective:  Patient ID: Kyle Farley, male    DOB: 05-25-1940  Age: 83 y.o. MRN: [id number]  Chief Complaint  Patient presents with   COPD   Hospitalization Follow-up   Transitions Of Care    HPI:  Patient was seen at [facility] on 05/10/23 and he was admitted for Acute on chronic hypoxic respiratory failure, COPD exacerbation, CHF, paroxysmal atrial fibrillation, chronic anticoagulation, hypertension, hyponatremia, tobacco abuse. He was discharged on 04/2623.  He was discharge at home, stable with continuous oxygen .  They sent mucinex , tessalon  pearls.   He is SOB, chest congestion, and coughing. He denies chest pain, lightheaded, dizziness.  Discussed the use of AI scribe software for clinical note transcription with the patient, who gave verbal consent to proceed.  History of Present Illness   Kyle Farley is an 83 year old male with COPD who presents for a hospital follow-up.  He was recently discharged from the hospital after treatment with prednisone , completing a five-day course. He experiences significant dyspnea, particularly with ambulation, which previously led to hospitalization. He uses nocturnal oxygen  and notes a significant drop in oxygen  saturation with activity. He spends most of his time in a recliner and uses a nebulizer at home, having recently resumed his medication after a delay in refills.  He has a history of fluid retention, previously managed with high-dose Lasix , now taking one diuretic daily. He reports improvement in leg swelling and reduced fluid retention. He has a past abdominal aneurysm without intervention.  His medications include Eliquis , atorvastatin , and Trelegy. He is allergic to gabapentin , which caused fluid retention. He uses over-the-counter Benadryl and Claritin for allergies. No recent fevers, worsening cough, gastrointestinal bleeding, or ulcers. His thyroid  function is stable.          10/20/2022    9:17 AM 08/05/2022    11:08 AM 05/27/2022    4:12 PM 04/02/2022   11:24 AM 04/02/2022   10:41 AM  Depression screen PHQ 2/9  Decreased Interest 0 0 0 0 0  Down, Depressed, Hopeless 0 0 0 0 0  PHQ - 2 Score 0 0 0 0 0  Altered sleeping  0 0    Tired, decreased energy  0 2    Change in appetite  0 2    Feeling bad or failure about yourself   0 0    Trouble concentrating  0 0    Moving slowly or fidgety/restless  0 3    Suicidal thoughts  0 0    PHQ-9 Score  0 7    Difficult doing work/chores  Not difficult at all Somewhat difficult          12/20/2022    3:18 PM  Fall Risk   Falls in the past year? 0  Number falls in past yr: 0  Injury with Fall? 0  Risk for fall due to : No Fall Risks    Patient Care Team: Mercy Stall, MD as PCP - General (Family Medicine) Lei Pump, MD as Consulting Physician (Cardiology) Hassan Links, MD as Consulting Physician (Cardiology)   Review of Systems  Constitutional:  Negative for chills, fatigue, fever and unexpected weight change.  HENT:  Negative for congestion, ear pain, sinus pain and sore throat.   Respiratory:  Positive for apnea, cough, chest tightness, shortness of breath and wheezing.   Cardiovascular:  Negative for chest pain and palpitations.  Gastrointestinal:  Negative for abdominal pain, blood in stool, constipation, diarrhea, nausea and vomiting.  Endocrine: Negative for polydipsia.  Genitourinary:  Negative for dysuria.  Musculoskeletal:  Negative for back pain.  Skin:  Negative for rash.  Neurological:  Negative for headaches.    Current Outpatient Medications on File Prior to Visit  Medication Sig Dispense Refill   albuterol  ([name] ) (2.5 MG/3ML) 0.083% nebulizer solution Take 3 mLs (2.5 mg total) by nebulization every 6 (six) hours as needed for wheezing or shortness of breath. 360 mL 12   albuterol  (VENTOLIN  HFA) 108 (90 Base) MCG/ACT inhaler Inhale 2 puffs into the lungs every 4 (four) hours as needed for wheezing or shortness  of breath. 48 g 3   amiodarone  (PACERONE ) 200 MG tablet Take 1 tablet (200 mg total) by mouth daily. (Patient taking differently: Take 200 mg by mouth 2 (two) times daily.)     amLODipine  (NORVASC ) 5 MG tablet Take 1 tablet by mouth once daily 90 tablet 0   apixaban  (ELIQUIS ) 5 MG TABS tablet Take 1 tablet (5 mg total) by mouth 2 (two) times daily. 180 tablet 3   atorvastatin  (LIPITOR) 40 MG tablet Take 1 tablet by mouth once daily 90 tablet 1   benzonatate  (TESSALON ) 100 MG capsule Take 100 mg by mouth 3 (three) times daily as needed.     Docusate Sodium  (STOOL SOFTENER LAXATIVE PO) Take 1 tablet by mouth daily.     empagliflozin  (JARDIANCE ) 25 MG TABS tablet Take 1 tablet (25 mg total) by mouth daily before breakfast. 90 tablet 0   EQ MUCUS RELIEF 600 MG 12 hr tablet SMARTSIG:1 Tablet(s) By Mouth Every 12 Hours     ferrous sulfate  325 (65 FE) MG EC tablet Take 325 mg by mouth daily.     furosemide  (LASIX ) 40 MG tablet Take 40 mg by mouth 3 (three) times daily.     levothyroxine  (SYNTHROID ) 75 MCG tablet Take 1 tablet (75 mcg total) by mouth daily. 90 tablet 3   metoprolol  succinate (TOPROL -XL) 50 MG 24 hr tablet Take 12.5 mg by mouth daily. Take with or immediately following a meal.     montelukast  (SINGULAIR ) 10 MG tablet Take 1 tablet (10 mg total) by mouth daily at 12 noon. 90 tablet 3   nitroGLYCERIN  (NITROSTAT ) 0.4 MG SL tablet DISSOLVE ONE TABLET UNDER THE TONGUE EVERY 5 MINUTES AS NEEDED FOR CHEST PAIN.  DO NOT EXCEED A TOTAL OF 3 DOSES IN 15 MINUTES 50 tablet 0   OXYGEN  Inhale 3 L into the lungs as needed (shortness of breath).     pantoprazole  (PROTONIX ) 40 MG tablet Take 40 mg by mouth daily.     tamsulosin  (FLOMAX ) 0.4 MG CAPS capsule Take 1 capsule by mouth once daily 90 capsule 0   TRELEGY ELLIPTA  200-62.5-25 MCG/ACT AEPB INHALE 2 PUFFS ONCE DAILY 60 each 0   No current facility-administered medications on file prior to visit.   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   Alcohol use 09/12/2020   Anemia of chronic disease 04/25/2022   Arthritis    Atherosclerosis of native arteries of extremities with intermittent claudication, bilateral legs (HCC) 11/10/2021  Bilateral leg edema 02/16/2022   BPH (benign prostatic hyperplasia) 11/10/2021   CHF (congestive heart failure) (HCC)    Chronic diastolic CHF (congestive heart failure) (HCC) 04/25/2022   Chronic hyponatremia 04/25/2022   Chronic respiratory failure with hypoxia (HCC) 05/09/2022   Cigarette smoker 10/21/2014   Class 1 obesity 04/25/2022   Cognitive communication deficit 03/05/2021   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)    Diabetic polyneuropathy associated with type 2 diabetes mellitus (HCC) 08/28/2022   Dyspnea    Dysrhythmia    atrial fibrillation   Elevated glucose 05/27/2022   Encounter for prostate cancer screening 02/16/2022   Encounter for screening for lung cancer 02/16/2022   Essential hypertension 10/21/2014   Femur fracture, right (HCC) 09/12/2020   GERD (gastroesophageal reflux disease)    History of kidney stones    Hyperlipidemia 01/24/2017   Hypertension    Hypo-osmolality and hyponatremia 03/05/2021   Hyponatremia 04/26/2022   Hypotension 08/05/2022   Kidney stones 01/24/2017   Laceration of right eyebrow 06/20/2022   Medication monitoring encounter 12/18/2022   Muscle wasting and atrophy, not elsewhere classified, multiple sites 03/05/2021   Nicotine  dependence, cigarettes, uncomplicated 03/05/2021   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 06/06/2022   Other abnormalities of gait and mobility 03/05/2021   Other chest pain 12/18/2022   Other lack of coordination 03/05/2021   Oxygen  dependent 08/05/2022   Paroxysmal atrial fibrillation (HCC) 10/21/2014   Pressure injury of skin 09/16/2020   Right ureteral stone 06/08/2022   Sepsis secondary to UTI (HCC) 06/08/2022   SOB (shortness of breath) 05/10/2022   Syncope and collapse 08/05/2022   Tobacco abuse 02/16/2022   Tobacco use disorder 02/16/2022   Type 2 diabetes mellitus (HCC) 06/08/2022   Visit for suture removal 06/15/2022   Past Surgical History:  Procedure Laterality Date   LEFT HEART CATH AND CORONARY ANGIOGRAPHY N/A 09/15/2020   Procedure: LEFT HEART CATH AND CORONARY ANGIOGRAPHY;  Surgeon: Arnoldo Lapping, MD;  Location[facility] INVASIVE CV LAB;  Service: Cardiovascular;  Laterality: N/A;   LEG SURGERY Right    steal pin placed in right leg 35 years ago   ORIF FEMUR FRACTURE Right 09/15/2020   Procedure: OPEN REDUCTION INTERNAL FIXATION FEMORAL SHAFT FRACTURE;  Surgeon: Laneta Pintos, MD;  Location: [facility] OR;  Service: Orthopedics;  Laterality: Right;    Family History  Problem Relation Age of Onset   Cancer Mother    Diabetes Mother    Breast cancer Sister    Social History   Socioeconomic History   Marital status: Widowed    Spouse name: Not on file   Number of children: 3   Years of education: Not on file   Highest education level: High school graduate  Occupational History   Occupation: retired  Tobacco Use   Smoking status: Every Day    Current packs/day: 1.50    Average packs/day: 1.5 packs/day for 62.0 years (93.0 ttl pk-yrs)    Types: Cigarettes, Cigars   Smokeless tobacco: Never  Vaping Use   Vaping status: Never Used  Substance and Sexual Activity   Alcohol use: Yes    Alcohol/week: 5.0 standard drinks of alcohol    Types: 5 Standard drinks or equivalent per week    Comment: 4-5 beers per day   Drug use: No   Sexual  activity: Not Currently  Other Topics Concern   Not on file  Social History Narrative   Not on file   Social Drivers of Health  Financial Resource Strain: Low Risk  (04/26/2022)   Overall Financial Resource Strain (CARDIA)    Difficulty of Paying Living Expenses: Not very hard  Food Insecurity: No Food Insecurity (03/09/2023)   Hunger Vital Sign    Worried About Running Out of Food in the Last Year: Never true    Ran Out of Food in the Last Year: Never true  Transportation Needs: No Transportation Needs (03/09/2023)   PRAPARE - Administrator, Civil Service (Medical): No    Lack of Transportation (Non-Medical): No  Physical Activity: Inactive (04/15/2022)   Exercise Vital Sign    Days of Exercise per Week: 0 days    Minutes of Exercise per Session: 0 min  Stress: No Stress Concern Present (04/15/2022)   [employer] of Occupational Health - Occupational Stress Questionnaire    Feeling of Stress : Not at all  Social Connections: Moderately Isolated (04/15/2022)   Social Connection and Isolation Panel [NHANES]    Frequency of Communication with Friends and Family: More than three times a week    Frequency of Social Gatherings with Friends and Family: More than three times a week    Attends Religious Services: More than 4 times per year    Active Member of [employer] or Organizations: No    Attends Banker Meetings: Never    Marital Status: Widowed    Objective:  BP 108/60   Pulse 60   Temp 98.5 F (36.9 C)   Resp (!) 24   Ht 5\' 9"  (1.753 m)   Wt 235 lb (106.6 kg)   SpO2 92% Comment: 3 L  BMI 34.70 kg/m      05/20/2023    8:05 AM 03/18/2023   10:32 AM 02/24/2023    9:26 AM  BP/Weight  Systolic BP 108 132 142  Diastolic BP 60 60 78  Wt. (Lbs) 235 235 252.6  BMI 34.7 kg/m2 34.7 kg/m2 37.3 kg/m2    Physical Exam Vitals reviewed.  Constitutional:      Appearance: Normal appearance.  Cardiovascular:     Rate and Rhythm: Normal rate and regular  rhythm.     Heart sounds: Normal heart sounds.  Pulmonary:     Effort: Prolonged expiration present.     Breath sounds: Wheezing present.  Abdominal:     General: Bowel sounds are normal.     Palpations: Abdomen is soft.     Tenderness: There is no abdominal tenderness.  Neurological:     Mental Status: He is alert and oriented to person, place, and time.  Psychiatric:        Mood and Affect: Mood normal.        Behavior: Behavior normal.     Diabetic Foot Exam - Simple   No data filed      Lab Results  Component Value Date   WBC 9.9 05/20/2023   HGB 13.6 05/20/2023   HCT 43.3 05/20/2023   PLT 248 05/20/2023   GLUCOSE 110 (H) 05/20/2023   CHOL 175 03/18/2023   TRIG 137 03/18/2023   HDL 67 03/18/2023   LDLCALC 84 03/18/2023   ALT 23 05/20/2023   AST 15 05/20/2023   NA 139 05/20/2023   K 4.7 05/20/2023   CL 99 05/20/2023   CREATININE 1.13 05/20/2023   BUN 15 05/20/2023   CO2 26 05/20/2023   TSH 4.390 05/02/2023   HGBA1C 6.1 (H) 03/18/2023      Assessment & Plan:  Primary hypertension Assessment & Plan: Controlled  Continue to monitor blood pressure at home Continue to follow up with cardiology Will adjust treatment depending on labs BP Readings from Last 3 Encounters:  05/20/23 108/60  03/18/23 132/60  02/24/23 (!) 142/78     Orders: -     CBC with Differential/Platelet  Paroxysmal atrial fibrillation (HCC) Assessment & Plan: Controlled Denies any new or worsening symptoms Continue to monitor  Continue to follow up with cardiology   Anemia of chronic disease Assessment & Plan: Labs drawn  Had hyponatremia at discharge from hosptial Will adjust treatment depending on labs   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 [facility]) Assessment & Plan: COPD with recent hospitalization. Dyspnea with ambulation, uses nocturnal oxygen . Considered low-dose daily prednisone  for breathing, monitoring blood glucose. Nebulizer used at home effectively. -  Prescribe low-dose Augmentin for 5 days to prevent infection. - Consult with Dr. Reinhold Carbine regarding initiation of low-dose daily prednisone . - Investigate availability of a home-use machine similar to the hospital's. - Ensure adequate nebulizer supplies.   Infrarenal abdominal aortic aneurysm (AAA) without rupture [facility]) Assessment & Plan: Abdominal aneurysm, not requiring intervention. No recent changes reported.   Bilateral leg edema Assessment & Plan: Improvement in leg swelling post-hospitalization. Reduced Lasix  dose may contribute to hyponatremia. Monitoring fluid retention and adjusting Lasix  dosage as needed. - Monitor fluid retention and adjust Lasix  dosage as needed. - Order blood test to check blood count and iron  levels.   Anemia, chronic disease -     Comprehensive metabolic panel with GFR -     Iron , TIBC and Ferritin Panel  COPD with acute exacerbation (HCC) -     Sulfamethoxazole-Trimethoprim; Take 1 tablet by mouth 2 (two) times daily.  Dispense: 10 tablet; Refill: 0    Assessment and Plan         Meds ordered this encounter  Medications   sulfamethoxazole-trimethoprim (BACTRIM DS) 800-160 MG tablet    Sig: Take 1 tablet by mouth 2 (two) times daily.    Dispense:  10 tablet    Refill:  0    Orders Placed This Encounter  Procedures   Comprehensive metabolic panel with GFR   CBC with Differential/Platelet   Iron , TIBC and Ferritin Panel     Follow-up: No follow-ups on file.   I,Marla I Leal-Borjas,acting as a scribe for [employer], PA.,have documented all relevant documentation on the behalf of Odilia Bennett, PA,as directed by  Odilia Bennett, PA while in the presence of Odilia Bennett, [place].   An After Visit Summary was printed and given to the patient.  Odilia Bennett, [place] [name] [phone]

## 2023-05-21 LAB — CBC WITH DIFFERENTIAL/PLATELET
Basophils Absolute: 0 10*3/uL (ref 0.0–0.2)
Basos: 0 %
EOS (ABSOLUTE): 0.1 10*3/uL (ref 0.0–0.4)
Eos: 1 %
Hematocrit: 43.3 % (ref 37.5–51.0)
Hemoglobin: 13.6 g/dL (ref 13.0–17.7)
Immature Grans (Abs): 0.1 10*3/uL (ref 0.0–0.1)
Immature Granulocytes: 1 %
Lymphocytes Absolute: 1.2 10*3/uL (ref 0.7–3.1)
Lymphs: 12 %
MCH: 29.9 pg (ref 26.6–33.0)
MCHC: 31.4 g/dL — ABNORMAL LOW (ref 31.5–35.7)
MCV: 95 fL (ref 79–97)
Monocytes Absolute: 1.1 10*3/uL — ABNORMAL HIGH (ref 0.1–0.9)
Monocytes: 11 %
Neutrophils Absolute: 7.3 10*3/uL — ABNORMAL HIGH (ref 1.4–7.0)
Neutrophils: 75 %
Platelets: 248 10*3/uL (ref 150–450)
RBC: 4.55 x10E6/uL (ref 4.14–5.80)
RDW: 12.8 % (ref 11.6–15.4)
WBC: 9.9 10*3/uL (ref 3.4–10.8)

## 2023-05-21 LAB — IRON,TIBC AND FERRITIN PANEL
Ferritin: 35 ng/mL (ref 30–400)
Iron Saturation: 9 % — CL (ref 15–55)
Iron: 37 ug/dL — ABNORMAL LOW (ref 38–169)
Total Iron Binding Capacity: 408 ug/dL (ref 250–450)
UIBC: 371 ug/dL — ABNORMAL HIGH (ref 111–343)

## 2023-05-21 LAB — COMPREHENSIVE METABOLIC PANEL WITH GFR
ALT: 23 IU/L (ref 0–44)
AST: 15 IU/L (ref 0–40)
Albumin: 4.1 g/dL (ref 3.7–4.7)
Alkaline Phosphatase: 99 IU/L (ref 44–121)
BUN/Creatinine Ratio: 13 (ref 10–24)
BUN: 15 mg/dL (ref 8–27)
Bilirubin Total: 0.3 mg/dL (ref 0.0–1.2)
CO2: 26 mmol/L (ref 20–29)
Calcium: 9.2 mg/dL (ref 8.6–10.2)
Chloride: 99 mmol/L (ref 96–106)
Creatinine, Ser: 1.13 mg/dL (ref 0.76–1.27)
Globulin, Total: 2.4 g/dL (ref 1.5–4.5)
Glucose: 110 mg/dL — ABNORMAL HIGH (ref 70–99)
Potassium: 4.7 mmol/L (ref 3.5–5.2)
Sodium: 139 mmol/L (ref 134–144)
Total Protein: 6.5 g/dL (ref 6.0–8.5)
eGFR: 65 mL/min/{1.73_m2} (ref >59)

## 2023-05-24 NOTE — Assessment & Plan Note (Signed)
 COPD with recent hospitalization. Dyspnea with ambulation, uses nocturnal oxygen . Considered low-dose daily prednisone  for breathing, monitoring blood glucose. Nebulizer used at home effectively. - Prescribe low-dose Augmentin for 5 days to prevent infection. - Consult with Dr. Reinhold Carbine regarding initiation of low-dose daily prednisone . - Investigate availability of a home-use machine similar to the hospital's. - Ensure adequate nebulizer supplies.

## 2023-05-24 NOTE — Assessment & Plan Note (Signed)
 Controlled Denies any new or worsening symptoms Continue to monitor  Continue to follow up with cardiology

## 2023-05-24 NOTE — Assessment & Plan Note (Signed)
 Labs drawn  Had hyponatremia at discharge from hosptial Will adjust treatment depending on labs

## 2023-05-24 NOTE — Assessment & Plan Note (Signed)
 Abdominal aneurysm, not requiring intervention. No recent changes reported.

## 2023-05-24 NOTE — Assessment & Plan Note (Signed)
 Controlled Continue to monitor blood pressure at home Continue to follow up with cardiology Will adjust treatment depending on labs BP Readings from Last 3 Encounters:  05/20/23 108/60  03/18/23 132/60  02/24/23 (!) 142/78

## 2023-05-24 NOTE — Assessment & Plan Note (Signed)
 Improvement in leg swelling post-hospitalization. Reduced Lasix  dose may contribute to hyponatremia. Monitoring fluid retention and adjusting Lasix  dosage as needed. - Monitor fluid retention and adjust Lasix  dosage as needed. - Order blood test to check blood count and iron  levels.

## 2023-05-27 ENCOUNTER — Other Ambulatory Visit: Payer: Self-pay | Admitting: Physician Assistant

## 2023-05-27 DIAGNOSIS — D509 Iron deficiency anemia, unspecified: Secondary | ICD-10-CM

## 2023-05-27 MED ORDER — FERROUS SULFATE 325 (65 FE) MG PO TBEC: 325.0000 mg | DELAYED_RELEASE_TABLET | Freq: Every day | ORAL | 3 refills | Status: DC

## 2023-05-28 ENCOUNTER — Other Ambulatory Visit: Payer: Self-pay | Admitting: Family Medicine

## 2023-05-30 ENCOUNTER — Other Ambulatory Visit: Payer: Self-pay | Admitting: Family Medicine

## 2023-06-01 NOTE — Progress Notes (Signed)
 Patient presents today to pick up diabetic shoes and insoles.  Patient was dispensed 1 pair of diabetic shoes and 3 pairs of total contact diabetic insoles. Fit was satisfactory. Instructions for break-in and wear was reviewed and a copy was given to the patient.   Re-appointment for regularly scheduled diabetic foot care visits or if they should experience any trouble with the shoes or insoles.

## 2023-06-02 ENCOUNTER — Other Ambulatory Visit: Payer: Self-pay

## 2023-06-02 ENCOUNTER — Other Ambulatory Visit: Payer: Self-pay | Admitting: Podiatry

## 2023-06-02 VITALS — Wt 229.0 lb

## 2023-06-02 DIAGNOSIS — I5022 Chronic systolic (congestive) heart failure: Secondary | ICD-10-CM

## 2023-06-02 DIAGNOSIS — E1142 Type 2 diabetes mellitus with diabetic polyneuropathy: Secondary | ICD-10-CM

## 2023-06-02 MED ORDER — QUTENZA (4 PATCH) 8 % EX KIT: 4.0000 | PACK | Freq: Once | CUTANEOUS | 0 refills | Status: DC

## 2023-06-02 NOTE — Patient Outreach (Signed)
 Complex Care Management   Visit Note  06/02/2023  Name:  Kyle Farley MRN: [id number] DOB: 1940-02-03  Situation: Referral received for Complex Care Management related to Heart Failure, COPD, and Diabetes with Complications I obtained verbal consent from Patient.  Visit completed with patient  on the phone  Background:   Past Medical History:  Diagnosis Date   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 (HCC) 11/10/2021   Acquired hypothyroidism 11/10/2021   Acute hypoxic respiratory failure (HCC) 04/25/2022   Acute on chronic diastolic heart failure (HCC) 04/25/2022   Acute on chronic respiratory failure with hypoxia (HCC) 04/25/2022   Acute on chronic systolic heart failure (HCC) 11/10/2021   AKI (acute kidney injury) (HCC) 06/08/2022   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/10/2021   Alcohol use 09/12/2020   Anemia of chronic disease 04/25/2022   Arthritis    Atherosclerosis of native arteries of extremities with intermittent claudication, bilateral legs (HCC) 11/10/2021   Bilateral leg edema 02/16/2022   BPH (benign prostatic hyperplasia) 11/10/2021   CHF (congestive heart failure) (HCC)    Chronic diastolic CHF (congestive heart failure) (HCC) 04/25/2022   Chronic hyponatremia 04/25/2022   Chronic respiratory failure with hypoxia (HCC) 05/09/2022   Cigarette smoker 10/21/2014   Class 1 obesity 04/25/2022   Cognitive communication deficit 03/05/2021   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)    Diabetic polyneuropathy associated with type 2 diabetes mellitus (HCC) 08/28/2022   Dyspnea    Dysrhythmia    atrial fibrillation   Elevated glucose 05/27/2022   Encounter for prostate cancer screening 02/16/2022   Encounter for screening for lung cancer 02/16/2022   Essential hypertension 10/21/2014   Femur fracture, right (HCC) 09/12/2020   GERD (gastroesophageal reflux disease)    History of kidney stones    Hyperlipidemia 01/24/2017   Hypertension     Hypo-osmolality and hyponatremia 03/05/2021   Hyponatremia 04/26/2022   Hypotension 08/05/2022   Kidney stones 01/24/2017   Laceration of right eyebrow 06/20/2022   Medication monitoring encounter 12/18/2022   Muscle wasting and atrophy, not elsewhere classified, multiple sites 03/05/2021   Nicotine  dependence, cigarettes, uncomplicated 03/05/2021   Non-seasonal allergic rhinitis due to pollen 06/06/2022   Other abnormalities of gait and mobility 03/05/2021   Other chest pain 12/18/2022   Other lack of coordination 03/05/2021   Oxygen  dependent 08/05/2022   Paroxysmal atrial fibrillation (HCC) 10/21/2014   Pressure injury of skin 09/16/2020   Right ureteral stone 06/08/2022   Sepsis secondary to UTI (HCC) 06/08/2022   SOB (shortness of breath) 05/10/2022   Syncope and collapse 08/05/2022   Tobacco abuse 02/16/2022   Tobacco use disorder 02/16/2022   Type 2 diabetes mellitus (HCC) 06/08/2022   Visit for suture removal 06/15/2022    Assessment: Patient Reported Symptoms:  Cognitive Cognitive Status: Alert and oriented to person, place, and time      Neurological Neurological Review of Symptoms: No symptoms reported    HEENT HEENT Symptoms Reported: Change or loss of hearing (hearing loss, had hearing aids, threw them away) HEENT Conditions: Ear problem(s) (will discuss with PCP during 06/20/23 visit) HEENT Management Strategies: Routine screening Ear problem(s) (will discuss with PCP during 06/20/23 visit)  Cardiovascular Cardiovascular Symptoms Reported: No symptoms reported, Swelling in legs or feet Does patient have uncontrolled Hypertension?: Yes Is patient checking Blood Pressure at home?: No Cardiovascular Conditions: Heart failure, Hypertension Weight: 229 lb (103.9 kg)  Respiratory Respiratory Symptoms Reported: Productive cough, Shortness of breath Respiratory Conditions: COPD Respiratory Self-Management Outcome:  2 (bad)  Endocrine Patient reports the following  symptoms related to hypoglycemia or hyperglycemia : No symptoms reported Is patient diabetic?: Yes Is patient checking blood sugars at home?: No Endocrine Conditions: Diabetes Endocrine Management Strategies: Medication therapy, Routine screening, Diet modification Endocrine Self-Management Outcome: 4 (good)  Gastrointestinal Gastrointestinal Symptoms Reported: Not assessed      Genitourinary Genitourinary Symptoms Reported: Not assessed    Integumentary Integumentary Symptoms Reported: Not assessed    Musculoskeletal Musculoskelatal Symptoms Reviewed: Not assessed        Psychosocial Psychosocial Symptoms Reported: Not assessed     Quality of Family Relationships: helpful, involved, supportive Do you feel physically threatened by others?: No      10/20/2022    9:17 AM  Depression screen PHQ 2/9  Decreased Interest 0  Down, Depressed, Hopeless 0  PHQ - 2 Score 0    There were no vitals filed for this visit.  Medications Reviewed Today     Reviewed by Clarnce Crow, RN (Registered Nurse) on 06/02/23 at 1428  Med List Status: <None>   Medication Order Taking? Sig Documenting Provider Last Dose Status Informant  albuterol  ([name] ) (2.5 MG/3ML) 0.083% nebulizer solution [id number] Yes Take 3 mLs (2.5 mg total) by nebulization every 6 (six) hours as needed for wheezing or shortness of breath. Wilfredo Hanly, MD Taking Active   albuterol  (VENTOLIN  HFA) 108 [phone] Base) MCG/ACT inhaler [id number] Yes Inhale 2 puffs into the lungs every 4 (four) hours as needed for wheezing or shortness of breath. [name], Kirsten, MD Taking Active   amiodarone  (PACERONE ) 200 MG tablet [id number] Yes Take 1 tablet (200 mg total) by mouth daily.  Patient taking differently: Take 200 mg by mouth 2 (two) times daily.   [name], Kirsten, MD Taking Active   amLODipine  (NORVASC ) 5 MG tablet [id number] Yes Take 1 tablet by mouth once daily [name], Kirsten, MD Taking Active   apixaban  (ELIQUIS ) 5 MG TABS tablet  [id number] Yes Take 1 tablet (5 mg total) by mouth 2 (two) times daily. [name], Kirsten, MD Taking Active   atorvastatin  (LIPITOR) 40 MG tablet [id number] Yes Take 1 tablet by mouth once daily Terrance Ferretti, NP Taking Active   benzonatate  (TESSALON ) 100 MG capsule [id number] Yes Take 100 mg by mouth 3 (three) times daily as needed. [provider] Taking Active   Docusate Sodium  (STOOL SOFTENER LAXATIVE PO) [id number] Yes Take 1 tablet by mouth daily. [provider] Taking Active   empagliflozin  (JARDIANCE ) 25 MG TABS tablet [id number] Yes Take 1 tablet (25 mg total) by mouth daily before breakfast. [name], Kirsten, MD Taking Active   EQ MUCUS RELIEF 600 MG 12 hr tablet [id number] Yes SMARTSIG:1 Tablet(s) By Mouth Every 12 Hours [provider] Taking Active   ferrous sulfate  325 (65 FE) MG EC tablet [id number] Yes Take 1 tablet (325 mg total) by mouth daily. Odilia Bennett, PA Taking Active   furosemide  (LASIX ) 40 MG tablet [id number] Yes Take 40 mg by mouth 3 (three) times daily. [provider] Taking Active            Med Note Burley Carpenter, Jadis Mika   Thu Jun 02, 2023  2:25 PM) Patient reports taking twice daily, not 3x daily  does not report increased swelling in LE.  Referral placed to clinical pharmacist  levothyroxine  (SYNTHROID ) 75 MCG tablet [id number] Yes Take 1 tablet (75 mcg total) by mouth daily. [name], Kirsten, MD Taking Active   metoprolol  succinate (TOPROL -XL) 50 MG 24 hr tablet [id number] Yes Take  12.5 mg by mouth daily. Take with or immediately following a meal. [provider] Taking Active Self           Med Note Oletta Berry, ASHLEY M   Thu Mar 24, 2023 10:07 AM) Patient taking differently. Taking one (50 mg) tablet daily  montelukast  (SINGULAIR ) 10 MG tablet [id number] Yes Take 1 tablet (10 mg total) by mouth daily at 12 noon. [name], Kirsten, MD Taking Active Self  nitroGLYCERIN  (NITROSTAT ) 0.4 MG SL tablet [id number] Yes DISSOLVE ONE TABLET UNDER THE TONGUE EVERY 5  MINUTES AS NEEDED FOR CHEST PAIN.  DO NOT EXCEED A TOTAL OF 3 DOSES IN 15 MINUTES [name], Kirsten, MD Taking Active   OXYGEN  [id number] Yes Inhale 3 L into the lungs as needed (shortness of breath). [provider] Taking Active Self  pantoprazole  (PROTONIX ) 40 MG tablet [id number] Yes Take 40 mg by mouth daily. [provider] Taking Active   sulfamethoxazole -trimethoprim  (BACTRIM  DS) 800-160 MG tablet [id number] No Take 1 tablet by mouth 2 (two) times daily.  Patient not taking: Reported on 06/02/2023   Odilia Bennett, [place] Not Taking Active            Med Note Burley Carpenter, Juliona Vales   Thu Jun 02, 2023  2:28 PM) Patient reports completed course  tamsulosin  (FLOMAX ) 0.4 MG CAPS capsule [id number] Yes Take 1 capsule by mouth once daily Mercy Stall, MD Taking Active   TRELEGY ELLIPTA  200-62.5-25 MCG/ACT AEPB [id number] Yes INHALE 2 PUFFS ONCE DAILY Sirivol, Mamatha, MD Taking Active             Recommendation:   PCP Follow-up  Follow Up Plan:   Telephone follow up appointment date/time:  06/15/23 at 11:30 Referral to Pharmacist   Clarnce Crow BSN RN CCM [place]  [facility], [facility] RN Care Manager Direct Dial: [phone] Fax: [phone]

## 2023-06-02 NOTE — Progress Notes (Signed)
 Placing order for Qutenza to specialty pharmacy

## 2023-06-02 NOTE — Patient Instructions (Signed)
 Visit Information  Thank you for taking time to visit with me today. Please don't hesitate to contact me if I can be of assistance to you before our next scheduled appointment.  Our next appointment is by telephone on 06/15/23 at 11:30 Please call the care guide team at [phone] if you need to cancel or reschedule your appointment.   Following is a copy of your care plan:   Goals Addressed             This Visit's Progress    VBCI RN Care Plan       Problems:  Chronic Disease Management support and education needs related to CHF, COPD, and DMII  Goal: Over the next 30 days the Patient will attend all scheduled medical appointments: PCP 6/3, Podiatry 6/23 as evidenced by chart review and patient reporting        continue to work with RN Care Manager and/or Social Worker to address care management and care coordination needs related to CHF, COPD, and DMII as evidenced by adherence to care management team scheduled appointments     demonstrate a decrease CHF, COPD, and DMII in exacerbations as evidenced by no hospital readmissions demonstrate Ongoing adherence to prescribed treatment plan for CHF, COPD, and DMII as evidenced by taking all medications as prescribed, attending all medical appointments, lifestyle and diet changes, considering smoking cessation, scheduling cardiology and pulmonology appointments  take all medications exactly as prescribed and will call provider for medication related questions as evidenced by chart review and patient report    work with pharmacist to address medication adherence and lack of education about medication instructions related to CHF, COPD, and DMII as evidenced by review of electronic medical record and patient or pharmacist report     Interventions:   Heart Failure Interventions: Basic overview and discussion of pathophysiology of Heart Failure reviewed Provided education on low sodium diet Assessed need for readable accurate scales in  home Provided education about placing scale on hard, flat surface Advised patient to weigh each morning after emptying bladder Discussed importance of daily weight and advised patient to weigh and record daily Reviewed role of diuretics in prevention of fluid overload and management of heart failure; Discussed the importance of keeping all appointments with provider  COPD Interventions: Advised patient to track and manage COPD triggers Provided instruction about proper use of medications used for management of COPD including inhalers Provided education about and advised patient to utilize infection prevention strategies to reduce risk of respiratory infection Discussed the importance of adequate rest and management of fatigue with COPD Screening for signs and symptoms of depression related to chronic disease state  Instructed patient to schedule follow up appointment with pulmonology and cardiology   Diabetes Interventions: Assessed patient's understanding of A1c goal: <6.5% Reviewed medications with patient and discussed importance of medication adherence Counseled on importance of regular laboratory monitoring as prescribed Discussed plans with patient for ongoing care management follow up and provided patient with direct contact information for care management team Referral made to pharmacy team for assistance with polypharmacy Review of patient status, including review of consultants reports, relevant laboratory and other test results, and medications completed Assessed social determinant of health barriers Lab Results  Component Value Date   HGBA1C 6.1 (H) 03/18/2023    Patient Self-Care Activities:  Attend all scheduled provider appointments Attend church or other social activities Call pharmacy for medication refills 3-7 days in advance of running out of medications Call provider office for new concerns  or questions  Perform all self care activities independently  Take  medications as prescribed   Work with the pharmacist to address medication management needs and will continue to work with the clinical team to address health care and disease management related needs call office if I gain more than 2 pounds in one day or 5 pounds in one week keep legs up while sitting track weight in diary use salt in moderation watch for swelling in feet, ankles and legs every day weigh myself daily begin a heart failure diary bring diary to all appointments eliminate smoking in my home eliminate symptom triggers at home follow rescue plan if symptoms flare-up keep follow-up appointments: needs appointment get at least 7 to 8 hours of sleep at night use devices that will help like a cane, sock-puller or reacher Schedule followup visit with pulmonology  Plan:  Telephone follow up appointment with care management team member scheduled for:    06/15/23 at 11:30             Please call the Suicide and Crisis Lifeline: 988 call the [place]  National Suicide Prevention Lifeline: (765)690-7315 or TTY: (613)379-7257 TTY (416)560-5718) to talk to a trained counselor call 1-800-273-TALK (toll free, 24 hour hotline) if you are experiencing a Mental Health or Behavioral Health Crisis or need someone to talk to.  The patient verbalized understanding of instructions, educational materials, and care plan provided today and agreed to receive a mailed copy of patient instructions, educational materials, and care plan.    Clarnce Crow BSN RN CCM [place]  [facility], [facility] RN Care Manager Direct Dial: [phone] Fax: [phone]

## 2023-06-03 ENCOUNTER — Other Ambulatory Visit: Payer: Self-pay

## 2023-06-03 MED ORDER — FUROSEMIDE 40 MG PO TABS: 40.0000 mg | ORAL_TABLET | Freq: Every day | ORAL | 0 refills | Status: DC

## 2023-06-11 ENCOUNTER — Other Ambulatory Visit: Payer: Self-pay | Admitting: Family Medicine

## 2023-06-14 ENCOUNTER — Other Ambulatory Visit: Payer: Self-pay | Admitting: Family Medicine

## 2023-06-15 ENCOUNTER — Other Ambulatory Visit: Payer: Self-pay

## 2023-06-15 NOTE — Patient Outreach (Signed)
 Complex Care Management   Visit Note  06/15/2023  Name:  Kyle Farley MRN: [id number] DOB: 1940/05/23  Situation: Referral received for Complex Care Management related to Heart Failure, COPD, and Diabetes with Complications I obtained verbal consent from Patient.  Visit completed with patient  on the phone  Background:   Past Medical History:  Diagnosis Date   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 (HCC) 11/10/2021   Acquired hypothyroidism 11/10/2021   Acute hypoxic respiratory failure (HCC) 04/25/2022   Acute on chronic diastolic heart failure (HCC) 04/25/2022   Acute on chronic respiratory failure with hypoxia (HCC) 04/25/2022   Acute on chronic systolic heart failure (HCC) 11/10/2021   AKI (acute kidney injury) (HCC) 06/08/2022   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/10/2021   Alcohol use 09/12/2020   Anemia of chronic disease 04/25/2022   Arthritis    Atherosclerosis of native arteries of extremities with intermittent claudication, bilateral legs (HCC) 11/10/2021   Bilateral leg edema 02/16/2022   BPH (benign prostatic hyperplasia) 11/10/2021   CHF (congestive heart failure) (HCC)    Chronic diastolic CHF (congestive heart failure) (HCC) 04/25/2022   Chronic hyponatremia 04/25/2022   Chronic respiratory failure with hypoxia (HCC) 05/09/2022   Cigarette smoker 10/21/2014   Class 1 obesity 04/25/2022   Cognitive communication deficit 03/05/2021   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)    Diabetic polyneuropathy associated with type 2 diabetes mellitus (HCC) 08/28/2022   Dyspnea    Dysrhythmia    atrial fibrillation   Elevated glucose 05/27/2022   Encounter for prostate cancer screening 02/16/2022   Encounter for screening for lung cancer 02/16/2022   Essential hypertension 10/21/2014   Femur fracture, right (HCC) 09/12/2020   GERD (gastroesophageal reflux disease)    History of kidney stones    Hyperlipidemia 01/24/2017   Hypertension     Hypo-osmolality and hyponatremia 03/05/2021   Hyponatremia 04/26/2022   Hypotension 08/05/2022   Kidney stones 01/24/2017   Laceration of right eyebrow 06/20/2022   Medication monitoring encounter 12/18/2022   Muscle wasting and atrophy, not elsewhere classified, multiple sites 03/05/2021   Nicotine  dependence, cigarettes, uncomplicated 03/05/2021   Non-seasonal allergic rhinitis due to pollen 06/06/2022   Other abnormalities of gait and mobility 03/05/2021   Other chest pain 12/18/2022   Other lack of coordination 03/05/2021   Oxygen  dependent 08/05/2022   Paroxysmal atrial fibrillation (HCC) 10/21/2014   Pressure injury of skin 09/16/2020   Right ureteral stone 06/08/2022   Sepsis secondary to UTI (HCC) 06/08/2022   SOB (shortness of breath) 05/10/2022   Syncope and collapse 08/05/2022   Tobacco abuse 02/16/2022   Tobacco use disorder 02/16/2022   Type 2 diabetes mellitus (HCC) 06/08/2022   Visit for suture removal 06/15/2022    Assessment: Patient Reported Symptoms:  Cognitive Cognitive Status: Alert and oriented to person, place, and time      Neurological Neurological Review of Symptoms: No symptoms reported    HEENT HEENT Symptoms Reported: Change or loss of hearing (will discuss Dr. Reinhold Carbine) HEENT Conditions: Ear problem(s) HEENT Management Strategies: Routine screening Ear problem(s)  Cardiovascular Cardiovascular Symptoms Reported: Swelling in legs or feet (still has some LE edema, is actively diuresing) Cardiovascular Conditions: Hypertension, Heart failure Cardiovascular Management Strategies: Routine screening, Medication therapy, Weight management, Diet modification, Fluid modification Do You Have a Working Readable Scale?: Yes Weight: 219 lb (99.3 kg)  Respiratory Respiratory Symptoms Reported: Productive cough, Shortness of breath Other Respiratory Symptoms: patient reports no change in symptoms, still has cough  and SOB Respiratory Conditions: Cough,  Shortness of breath  Endocrine Patient reports the following symptoms related to hypoglycemia or hyperglycemia : No symptoms reported Is patient diabetic?: Yes Is patient checking blood sugars at home?: No Endocrine Conditions: Diabetes Endocrine Management Strategies: Routine screening, Medication therapy  Gastrointestinal Gastrointestinal Symptoms Reported: No symptoms reported      Genitourinary Genitourinary Symptoms Reported: No symptoms reported    Integumentary Integumentary Symptoms Reported: No symptoms reported    Musculoskeletal Musculoskelatal Symptoms Reviewed: Unsteady gait Additional Musculoskeletal Details: patient reports right leg pain if too much walking  had broken hip and leg Musculoskeletal Conditions: Joint pain, Unsteady gait Musculoskeletal Management Strategies: Medical device, Activity, Adequate rest, Medication therapy, Coping strategies Falls in the past year?: No    Psychosocial Psychosocial Symptoms Reported: No symptoms reported            10/20/2022    9:17 AM  Depression screen PHQ 2/9  Decreased Interest 0  Down, Depressed, Hopeless 0  PHQ - 2 Score 0    Vitals:   06/15/23 1138  BP: 135/65    Medications Reviewed Today     Reviewed by Clarnce Crow, RN (Registered Nurse) on 06/15/23 at 1142  Med List Status: <None>   Medication Order Taking? Sig Documenting Provider Last Dose Status Informant  albuterol  ([name] ) (2.5 MG/3ML) 0.083% nebulizer solution [id number] No Take 3 mLs (2.5 mg total) by nebulization every 6 (six) hours as needed for wheezing or shortness of breath. Wilfredo Hanly, MD Taking Active   albuterol  (VENTOLIN  HFA) 108 [phone] Base) MCG/ACT inhaler [id number] No Inhale 2 puffs into the lungs every 4 (four) hours as needed for wheezing or shortness of breath. [name], Kirsten, MD Taking Active   amiodarone  (PACERONE ) 200 MG tablet [id number] No Take 1 tablet (200 mg total) by mouth daily.  Patient taking differently: Take 200  mg by mouth 2 (two) times daily.   [name], Kirsten, MD Taking Active   amLODipine  (NORVASC ) 5 MG tablet [id number] No Take 1 tablet by mouth once daily [name], Kirsten, MD Taking Active   apixaban  (ELIQUIS ) 5 MG TABS tablet [id number] No Take 1 tablet (5 mg total) by mouth 2 (two) times daily. [name], Kirsten, MD Taking Active   atorvastatin  (LIPITOR) 40 MG tablet [id number] No Take 1 tablet by mouth once daily Terrance Ferretti, NP Taking Active   benzonatate  (TESSALON ) 100 MG capsule [id number] No Take 100 mg by mouth 3 (three) times daily as needed. [provider] Taking Active   Docusate Sodium  (STOOL SOFTENER LAXATIVE PO) [id number] No Take 1 tablet by mouth daily. [provider] Taking Active   EQ MUCUS RELIEF 600 MG 12 hr tablet [id number] No SMARTSIG:1 Tablet(s) By Mouth Every 12 Hours [provider] Taking Active   ferrous sulfate  325 (65 FE) MG EC tablet [id number] No Take 1 tablet (325 mg total) by mouth daily. Odilia Bennett, PA Taking Active   furosemide  (LASIX ) 40 MG tablet [id number]  Take 1 tablet (40 mg total) by mouth daily. Mercy Stall, MD  Active   JARDIANCE  25 MG TABS tablet [id number]  TAKE 1 TABLET BY MOUTH ONCE DAILY BEFORE Loreen Roers, Kirsten, MD  Active   levothyroxine  (SYNTHROID ) 75 MCG tablet [id number] No Take 1 tablet (75 mcg total) by mouth daily. [name], Kirsten, MD Taking Active   metoprolol  succinate (TOPROL -XL) 50 MG 24 hr tablet [id number] No Take 12.5 mg by mouth daily. Take with or immediately following a meal. [provider] Taking Active Self  Med Note Oletta Berry, ASHLEY M   Thu Mar 24, 2023 10:07 AM) Patient taking differently. Taking one (50 mg) tablet daily  montelukast  (SINGULAIR ) 10 MG tablet [id number] No Take 1 tablet (10 mg total) by mouth daily at 12 noon. [name], Kirsten, MD Taking Active Self  nitroGLYCERIN  (NITROSTAT ) 0.4 MG SL tablet [id number]  DISSOLVE ONE TABLET UNDER THE TONGUE EVERY 5 MINUTES AS NEEDED FOR CHEST PAIN.  DO  NOT EXCEED A TOTAL OF 3 DOSES IN 15 MINUTES [name], Kirsten, MD  Active   OXYGEN  [id number] No Inhale 3 L into the lungs as needed (shortness of breath). [provider] Taking Active Self  pantoprazole  (PROTONIX ) 40 MG tablet [id number] No Take 40 mg by mouth daily. [provider] Taking Active   sulfamethoxazole -trimethoprim  (BACTRIM  DS) 800-160 MG tablet [id number] No Take 1 tablet by mouth 2 (two) times daily.  Patient not taking: Reported on 06/02/2023   Odilia Bennett, [place] Not Taking Active            Med Note Burley Carpenter, Chanc Kervin   Thu Jun 02, 2023  2:28 PM) Patient reports completed course  tamsulosin  (FLOMAX ) 0.4 MG CAPS capsule [id number] No Take 1 capsule by mouth once daily Mercy Stall, MD Taking Active   TRELEGY ELLIPTA  200-62.5-25 MCG/ACT AEPB [id number] No INHALE 2 PUFFS ONCE DAILY Sirivol, Mamatha, MD Taking Active             Recommendation:   PCP Follow-up  patient needs to schedule appointments with Cardiology and Pulmonology  Follow Up Plan:   Telephone follow up appointment date/time:  06/29/23   Clarnce Crow BSN RN CCM [place]  [facility], [facility] RN Care Manager Direct Dial: [phone] Fax: [phone]

## 2023-06-15 NOTE — Patient Instructions (Signed)
 Visit Information  Thank you for taking time to visit with me today. Please don't hesitate to contact me if I can be of assistance to you before our next scheduled appointment.  Our next appointment is by telephone on 06/29/23 at 11:00 Please call the care guide team at [phone] if you need to cancel or reschedule your appointment.   Following is a copy of your care plan:   Goals Addressed             This Visit's Progress    VBCI RN Care Plan   On track    Problems:  Chronic Disease Management support and education needs related to CHF, COPD, and DMII  Goal: Over the next 30 days the Patient will attend all scheduled medical appointments: PCP 6/3, Podiatry 6/23 as evidenced by chart review and patient reporting        continue to work with RN Care Manager and/or Social Worker to address care management and care coordination needs related to CHF, COPD, and DMII as evidenced by adherence to care management team scheduled appointments     demonstrate a decrease CHF, COPD, and DMII in exacerbations as evidenced by no hospital readmissions demonstrate Ongoing adherence to prescribed treatment plan for CHF, COPD, and DMII as evidenced by taking all medications as prescribed, attending all medical appointments, lifestyle and diet changes, considering smoking cessation, scheduling cardiology and pulmonology appointments  take all medications exactly as prescribed and will call provider for medication related questions as evidenced by chart review and patient report    work with pharmacist to address medication adherence and lack of education about medication instructions related to CHF, COPD, and DMII as evidenced by review of electronic medical record and patient or pharmacist report     Interventions:   Heart Failure Interventions: Basic overview and discussion of pathophysiology of Heart Failure reviewed Provided education on low sodium diet Assessed need for readable accurate scales  in home Provided education about placing scale on hard, flat surface Advised patient to weigh each morning after emptying bladder Discussed importance of daily weight and advised patient to weigh and record daily Reviewed role of diuretics in prevention of fluid overload and management of heart failure; Discussed the importance of keeping all appointments with provider  COPD Interventions: Advised patient to track and manage COPD triggers Provided instruction about proper use of medications used for management of COPD including inhalers Provided education about and advised patient to utilize infection prevention strategies to reduce risk of respiratory infection Discussed the importance of adequate rest and management of fatigue with COPD Screening for signs and symptoms of depression related to chronic disease state  Instructed patient to schedule follow up appointment with pulmonology and cardiology   Diabetes Interventions: Assessed patient's understanding of A1c goal: <6.5% Reviewed medications with patient and discussed importance of medication adherence Counseled on importance of regular laboratory monitoring as prescribed Discussed plans with patient for ongoing care management follow up and provided patient with direct contact information for care management team Referral made to pharmacy team for assistance with polypharmacy Review of patient status, including review of consultants reports, relevant laboratory and other test results, and medications completed Assessed social determinant of health barriers Lab Results  Component Value Date   HGBA1C 6.1 (H) 03/18/2023    Patient Self-Care Activities:  Attend all scheduled provider appointments Attend church or other social activities Call pharmacy for medication refills 3-7 days in advance of running out of medications Call provider office for new  concerns or questions  Perform all self care activities independently  Take  medications as prescribed   Work with the pharmacist to address medication management needs and will continue to work with the clinical team to address health care and disease management related needs call office if I gain more than 2 pounds in one day or 5 pounds in one week keep legs up while sitting track weight in diary use salt in moderation watch for swelling in feet, ankles and legs every day weigh myself daily begin a heart failure diary bring diary to all appointments eliminate smoking in my home eliminate symptom triggers at home follow rescue plan if symptoms flare-up keep follow-up appointments: needs appointment get at least 7 to 8 hours of sleep at night use devices that will help like a cane, sock-puller or reacher Schedule followup visit with pulmonology  Plan:  Telephone follow up appointment with care management team member scheduled for:    06/29/23 at 11:00             Please call the Suicide and Crisis Lifeline: 988 call the [place]  National Suicide Prevention Lifeline: 413-674-5865 or TTY: 316-277-4796 TTY 548-047-2915) to talk to a trained counselor call 1-800-273-TALK (toll free, 24 hour hotline) if you are experiencing a Mental Health or Behavioral Health Crisis or need someone to talk to.  The patient verbalized understanding of instructions, educational materials, and care plan provided today and DECLINED offer to receive copy of patient instructions, educational materials, and care plan.    Clarnce Crow BSN RN CCM [place]  [facility], [facility] RN Care Manager Direct Dial: [phone] Fax: [phone]

## 2023-06-21 ENCOUNTER — Ambulatory Visit (INDEPENDENT_AMBULATORY_CARE_PROVIDER_SITE_OTHER): Payer: PPO | Admitting: Family Medicine

## 2023-06-21 ENCOUNTER — Encounter: Payer: Self-pay | Admitting: Family Medicine

## 2023-06-21 VITALS — BP 128/62 | HR 57 | Temp 98.2°F | Ht 69.0 in | Wt 237.0 lb

## 2023-06-21 DIAGNOSIS — E039 Hypothyroidism, unspecified: Secondary | ICD-10-CM | POA: Diagnosis not present

## 2023-06-21 DIAGNOSIS — E1142 Type 2 diabetes mellitus with diabetic polyneuropathy: Secondary | ICD-10-CM

## 2023-06-21 DIAGNOSIS — E782 Mixed hyperlipidemia: Secondary | ICD-10-CM

## 2023-06-21 DIAGNOSIS — Z9981 Dependence on supplemental oxygen: Secondary | ICD-10-CM | POA: Diagnosis not present

## 2023-06-21 DIAGNOSIS — J9611 Chronic respiratory failure with hypoxia: Secondary | ICD-10-CM | POA: Diagnosis not present

## 2023-06-21 DIAGNOSIS — I480 Paroxysmal atrial fibrillation: Secondary | ICD-10-CM | POA: Diagnosis not present

## 2023-06-21 DIAGNOSIS — I5023 Acute on chronic systolic (congestive) heart failure: Secondary | ICD-10-CM | POA: Diagnosis not present

## 2023-06-21 DIAGNOSIS — I10 Essential (primary) hypertension: Secondary | ICD-10-CM

## 2023-06-21 DIAGNOSIS — J410 Simple chronic bronchitis: Secondary | ICD-10-CM | POA: Diagnosis not present

## 2023-06-21 MED ORDER — AIRSUPRA 90-80 MCG/ACT IN AERO: 2.0000 | INHALATION_SPRAY | RESPIRATORY_TRACT | 5 refills | Status: DC | PRN

## 2023-06-21 MED ORDER — AIRSUPRA 90-80 MCG/ACT IN AERO: 2.0000 | INHALATION_SPRAY | RESPIRATORY_TRACT | Status: DC | PRN

## 2023-06-21 NOTE — Patient Instructions (Signed)
 RECOMMEND DISCONTINUE ALBUTEROL  INHALER. USE AIRSUPRA INHALER INSTEAD. YOU CAN TAKE 2 PUFFS EVERY 4 HOURS. RINSE MOUTH AFTER USE. CONTINUE TRELEGY.   PLEASE CHECK MEDICINES AND LET ME KNOW IF YOU ARE TAKING METOPROLOL  AND WHAT THE DOSE IS.

## 2023-06-21 NOTE — Progress Notes (Signed)
 Subjective:  Patient ID: Kyle Farley, male    DOB: 01-06-1941  Age: 83 y.o. MRN: [id number]  Chief Complaint  Patient presents with   Medical Management of Chronic Issues    HPI: Atrial fibrillation: Amiodarone  200 mg twice daily.  Unclear if patient is taking metoprolol .  He is going to check on this.  He is also on Eliquis . Overdue to see Dr. Sandee Crook and plans to call to get an appointment.  Hypertension: amlodipine  5 mg daily, lasix  40 mg daily, metoprolol  xl 50 mg 12.5 mg daily (unclear if patient is taking metoprolol .) He is going to call and let us  know if he is taking it.    COPD: On Trelegy daily.  Using albuterol  2 puffs 4 times a day due to shortness of breath.  Patient has chronic respiratory failure and is on 2 L of oxygen .  Reviewed recent hospitalization.  Diabetes: Not checking sugars.  On Jardiance   She does see Dr. Marvis Sluder podiatry for his foot exams.  Hyperlipidemia: on atorvastatin  for your cholesterol.  Hypothyroidism: on levothyroxine .  BPH: on tamsulosin .      06/21/2023   10:19 AM 10/20/2022    9:17 AM 08/05/2022   11:08 AM 05/27/2022    4:12 PM 04/02/2022   11:24 AM  Depression screen PHQ 2/9  Decreased Interest 0 0 0 0 0  Down, Depressed, Hopeless 0 0 0 0 0  PHQ - 2 Score 0 0 0 0 0  Altered sleeping   0 0   Tired, decreased energy   0 2   Change in appetite   0 2   Feeling bad or failure about yourself    0 0   Trouble concentrating   0 0   Moving slowly or fidgety/restless   0 3   Suicidal thoughts   0 0   PHQ-9 Score   0 7   Difficult doing work/chores   Not difficult at all Somewhat difficult         06/15/2023   11:47 AM  Fall Risk   Falls in the past year? 0    Patient Care Team: Mercy Stall, MD as PCP - General (Family Medicine) Lei Pump, MD as Consulting Physician (Cardiology) Hassan Links, MD as Consulting Physician (Cardiology) Clarnce Crow, RN as Registered Nurse   Review of Systems  Constitutional:  Negative  for chills, fatigue and fever.  HENT:  Negative for congestion, ear pain and sore throat.   Respiratory:  Negative for cough and shortness of breath.   Cardiovascular:  Negative for chest pain.  Gastrointestinal:  Negative for abdominal pain, constipation, diarrhea, nausea and vomiting.  Endocrine: Negative for polydipsia, polyphagia and polyuria.  Genitourinary:  Negative for dysuria and frequency.  Musculoskeletal:  Negative for arthralgias and myalgias.  Neurological:  Negative for dizziness and headaches.  Psychiatric/Behavioral:  Negative for dysphoric mood.        No dysphoria    Current Outpatient Medications on File Prior to Visit  Medication Sig Dispense Refill   amiodarone  (PACERONE ) 200 MG tablet Take 1 tablet (200 mg total) by mouth daily. (Patient taking differently: Take 200 mg by mouth 2 (two) times daily.)     amLODipine  (NORVASC ) 5 MG tablet Take 1 tablet by mouth once daily 90 tablet 0   apixaban  (ELIQUIS ) 5 MG TABS tablet Take 1 tablet (5 mg total) by mouth 2 (two) times daily. 180 tablet 3   atorvastatin  (LIPITOR) 40 MG tablet Take 1 tablet by  mouth once daily 90 tablet 1   Docusate Sodium  (STOOL SOFTENER LAXATIVE PO) Take 1 tablet by mouth daily.     ferrous sulfate  325 (65 FE) MG EC tablet Take 1 tablet (325 mg total) by mouth daily. 30 tablet 3   furosemide  (LASIX ) 40 MG tablet Take 1 tablet (40 mg total) by mouth daily. 90 tablet 0   JARDIANCE  25 MG TABS tablet TAKE 1 TABLET BY MOUTH ONCE DAILY BEFORE BREAKFAST 90 tablet 0   levothyroxine  (SYNTHROID ) 75 MCG tablet Take 1 tablet (75 mcg total) by mouth daily. 90 tablet 3   metoprolol  succinate (TOPROL -XL) 50 MG 24 hr tablet Take 12.5 mg by mouth daily. Take with or immediately following a meal.     montelukast  (SINGULAIR ) 10 MG tablet Take 1 tablet (10 mg total) by mouth daily at 12 noon. 90 tablet 3   nitroGLYCERIN  (NITROSTAT ) 0.4 MG SL tablet DISSOLVE ONE TABLET UNDER THE TONGUE EVERY 5 MINUTES AS NEEDED FOR CHEST  PAIN.  DO NOT EXCEED A TOTAL OF 3 DOSES IN 15 MINUTES 50 tablet 0   OXYGEN  Inhale 3 L into the lungs as needed (shortness of breath).     pantoprazole  (PROTONIX ) 40 MG tablet Take 40 mg by mouth daily.     tamsulosin  (FLOMAX ) 0.4 MG CAPS capsule Take 1 capsule by mouth once daily 90 capsule 0   TRELEGY ELLIPTA  200-62.5-25 MCG/ACT AEPB INHALE 2 PUFFS ONCE DAILY 60 each 0   No current facility-administered medications on file prior to visit.   Past Medical History:  Diagnosis Date   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 (HCC) 11/10/2021   Acquired hypothyroidism 11/10/2021   Acute hypoxic respiratory failure (HCC) 04/25/2022   Acute on chronic diastolic heart failure (HCC) 04/25/2022   Acute on chronic respiratory failure with hypoxia (HCC) 04/25/2022   Acute on chronic systolic heart failure (HCC) 11/10/2021   AKI (acute kidney injury) (HCC) 06/08/2022   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/10/2021   Alcohol use 09/12/2020   Anemia of chronic disease 04/25/2022   Arthritis    Atherosclerosis of native arteries of extremities with intermittent claudication, bilateral legs (HCC) 11/10/2021   Bilateral leg edema 02/16/2022   BPH (benign prostatic hyperplasia) 11/10/2021   CHF (congestive heart failure) (HCC)    Chronic diastolic CHF (congestive heart failure) (HCC) 04/25/2022   Chronic hyponatremia 04/25/2022   Chronic respiratory failure with hypoxia (HCC) 05/09/2022   Cigarette smoker 10/21/2014   Class 1 obesity 04/25/2022   Cognitive communication deficit 03/05/2021   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)    Diabetic polyneuropathy associated with type 2 diabetes mellitus (HCC) 08/28/2022   Dyspnea    Dysrhythmia    atrial fibrillation   Elevated glucose 05/27/2022   Encounter for prostate cancer screening 02/16/2022   Encounter for screening for lung cancer 02/16/2022   Essential hypertension 10/21/2014   Femur fracture, right (HCC)  09/12/2020   GERD (gastroesophageal reflux disease)    History of kidney stones    Hyperlipidemia 01/24/2017   Hypertension    Hypo-osmolality and hyponatremia 03/05/2021   Hyponatremia 04/26/2022   Hypotension 08/05/2022   Kidney stones 01/24/2017   Laceration of right eyebrow 06/20/2022   Medication monitoring encounter 12/18/2022   Muscle wasting and atrophy, not elsewhere classified, multiple sites 03/05/2021   Nicotine  dependence, cigarettes, uncomplicated 03/05/2021   Non-seasonal allergic rhinitis due to pollen 06/06/2022   Other abnormalities of gait and mobility 03/05/2021   Other chest pain 12/18/2022  Other lack of coordination 03/05/2021   Oxygen  dependent 08/05/2022   Paroxysmal atrial fibrillation (HCC) 10/21/2014   Pressure injury of skin 09/16/2020   Right ureteral stone 06/08/2022   Sepsis secondary to UTI (HCC) 06/08/2022   SOB (shortness of breath) 05/10/2022   Syncope and collapse 08/05/2022   Tobacco abuse 02/16/2022   Tobacco use disorder 02/16/2022   Type 2 diabetes mellitus (HCC) 06/08/2022   Visit for suture removal 06/15/2022   Past Surgical History:  Procedure Laterality Date   LEFT HEART CATH AND CORONARY ANGIOGRAPHY N/A 09/15/2020   Procedure: LEFT HEART CATH AND CORONARY ANGIOGRAPHY;  Surgeon: Arnoldo Lapping, MD;  Location: [facility] INVASIVE CV LAB;  Service: Cardiovascular;  Laterality: N/A;   LEG SURGERY Right    steal pin placed in right leg 35 years ago   ORIF FEMUR FRACTURE Right 09/15/2020   Procedure: OPEN REDUCTION INTERNAL FIXATION FEMORAL SHAFT FRACTURE;  Surgeon: Laneta Pintos, MD;  Location: [facility] OR;  Service: Orthopedics;  Laterality: Right;    Family History  Problem Relation Age of Onset   Cancer Mother    Diabetes Mother    Breast cancer Sister    Social History   Socioeconomic History   Marital status: Widowed    Spouse name: Not on file   Number of children: 3   Years of education: Not on file   Highest education level: High  school graduate  Occupational History   Occupation: retired  Tobacco Use   Smoking status: Every Day    Current packs/day: 1.50    Average packs/day: 1.5 packs/day for 62.0 years (93.0 ttl pk-yrs)    Types: Cigarettes, Cigars   Smokeless tobacco: Never  Vaping Use   Vaping status: Never Used  Substance and Sexual Activity   Alcohol use: Yes    Alcohol/week: 5.0 standard drinks of alcohol    Types: 5 Standard drinks or equivalent per week    Comment: 4-5 beers per day   Drug use: No   Sexual activity: Not Currently  Other Topics Concern   Not on file  Social History Narrative   Not on file   Social Drivers of Health   Financial Resource Strain: Low Risk  (06/21/2023)   Overall Financial Resource Strain (CARDIA)    Difficulty of Paying Living Expenses: Not hard at all  Food Insecurity: No Food Insecurity (06/15/2023)   Hunger Vital Sign    Worried About Running Out of Food in the Last Year: Never true    Ran Out of Food in the Last Year: Never true  Transportation Needs: No Transportation Needs (06/15/2023)   PRAPARE - Administrator, Civil Service (Medical): No    Lack of Transportation (Non-Medical): No  Physical Activity: Inactive (06/21/2023)   Exercise Vital Sign    Days of Exercise per Week: 0 days    Minutes of Exercise per Session: 0 min  Stress: No Stress Concern Present (06/21/2023)   [employer] of Occupational Health - Occupational Stress Questionnaire    Feeling of Stress : Not at all  Social Connections: Moderately Isolated (06/21/2023)   Social Connection and Isolation Panel [NHANES]    Frequency of Communication with Friends and Family: More than three times a week    Frequency of Social Gatherings with Friends and Family: More than three times a week    Attends Religious Services: More than 4 times per year    Active Member of [employer] or Organizations: No    Attends Club or  Organization Meetings: Never    Marital Status: Widowed    Objective:   BP 128/62   Pulse (!) 57   Temp 98.2 F (36.8 C)   Ht 5\' 9"  (1.753 m)   Wt 237 lb (107.5 kg)   SpO2 93%   BMI 35.00 kg/m      06/21/2023   10:15 AM 06/15/2023   11:38 AM 06/02/2023    2:18 PM  BP/Weight  Systolic BP 128 135   Diastolic BP 62 65   Wt. (Lbs) 237 219 229  BMI 35 kg/m2 32.34 kg/m2 33.82 kg/m2    Physical Exam Vitals reviewed.  Constitutional:      Appearance: Normal appearance.  Neck:     Vascular: No carotid bruit.  Cardiovascular:     Rate and Rhythm: Normal rate and regular rhythm.     Heart sounds: Normal heart sounds.  Pulmonary:     Effort: Pulmonary effort is normal.     Breath sounds: Wheezing (diffuse.) present. No rhonchi or rales.  Abdominal:     General: Bowel sounds are normal.     Palpations: Abdomen is soft.     Tenderness: There is no abdominal tenderness.  Neurological:     Mental Status: He is alert.  Psychiatric:        Mood and Affect: Mood normal.        Behavior: Behavior normal.     Diabetic Foot Exam - Simple   No data filed      Lab Results  Component Value Date   WBC 9.5 06/21/2023   HGB 13.3 06/21/2023   HCT 41.4 06/21/2023   PLT 213 06/21/2023   GLUCOSE 87 06/21/2023   CHOL 156 06/21/2023   TRIG 105 06/21/2023   HDL 59 06/21/2023   LDLCALC 78 06/21/2023   ALT 14 06/21/2023   AST 17 06/21/2023   NA 138 06/21/2023   K 4.7 06/21/2023   CL 97 06/21/2023   CREATININE 1.10 06/21/2023   BUN 14 06/21/2023   CO2 23 06/21/2023   TSH 4.390 05/02/2023   HGBA1C 6.0 (H) 06/21/2023      Assessment & Plan:  Primary hypertension Assessment & Plan: Well controlled. Continue toprol  xl, amlodipine  and Lasix .   Paroxysmal atrial fibrillation (HCC) Assessment & Plan: Controlled Denies any new or worsening symptoms Continue to monitor  Continue to follow up with cardiology   Acquired hypothyroidism Assessment & Plan: Continue on levothyroxine  to 75 mcg once daily in am.     Chronic respiratory failure with  hypoxia (HCC) Assessment & Plan: RECOMMEND DISCONTINUE ALBUTEROL  INHALER. USE AIRSUPRA  INHALER INSTEAD. YOU CAN TAKE 2 PUFFS EVERY 4 HOURS. RINSE MOUTH AFTER USE. CONTINUE TRELEGY.    Diabetic polyneuropathy associated with type 2 diabetes mellitus (HCC) Assessment & Plan: Control: well controlled Recommend annual eye exams. Medicines: On Jardiance  to 25 mg daily.   Continue to work on eating a healthy diet and exercise.  Wash feet daily.   Check feet daily for sores.  Order diabetic shoes Recommend using stretchy diabetic socks to protect your feet and keep them clean. Labs drawn today.     Orders: -     CBC with Differential/Platelet -     Hemoglobin A1c -     Microalbumin / creatinine urine ratio  Mixed hyperlipidemia Assessment & Plan: Well controlled.  No changes to medicines. Atorvastatin  40 mg daily Continue to work on eating a healthy diet and exercise.  Labs drawn today.    Orders: -  Comprehensive metabolic panel with GFR -     Lipid panel  Oxygen  dependent Assessment & Plan: The current medical regimen is effective;  continue present plan and medications.    Simple chronic bronchitis (HCC) Assessment & Plan: Continue trelegy one inhalation daily. Change albuterol  to airsupra  2 puffs four times a day as needed.   Orders: -     Airsupra ; Inhale 2 puffs into the lungs every 4 (four) hours as needed.  Dispense: 10.7 g; Refill: 5  Acute on chronic systolic congestive heart failure (HCC) Assessment & Plan: Management per specialist. Continue Amlodipine  5 mg daily, Lasix  20 mg daily.        Meds ordered this encounter  Medications   DISCONTD: Albuterol -Budesonide  (AIRSUPRA ) 90-80 MCG/ACT AERO    Sig: Inhale 2 puffs into the lungs every 4 (four) hours as needed.   Albuterol -Budesonide  (AIRSUPRA ) 90-80 MCG/ACT AERO    Sig: Inhale 2 puffs into the lungs every 4 (four) hours as needed.    Dispense:  10.7 g    Refill:  5    Orders Placed This  Encounter  Procedures   CBC with Differential/Platelet   Comprehensive metabolic panel with GFR   Hemoglobin A1c   Lipid panel   Microalbumin / creatinine urine ratio     Follow-up: Return in about 3 months (around 09/21/2023) for chronic follow up.   I,Katherina A Bramblett,acting as a scribe for Mercy Stall, MD.,have documented all relevant documentation on the behalf of Mercy Stall, MD,as directed by  Mercy Stall, MD while in the presence of Mercy Stall, MD.   Gladys Lamp I Leal-Borjas,acting as a scribe for Mercy Stall, MD.,have documented all relevant documentation on the behalf of Mercy Stall, MD,as directed by  Mercy Stall, MD while in the presence of Mercy Stall, MD.    An After Visit Summary was printed and given to the patient.  I attest that I have reviewed this visit and agree with the plan scribed by my staff.   Mercy Stall, MD [facility] [phone]

## 2023-06-22 ENCOUNTER — Ambulatory Visit: Payer: Self-pay | Admitting: Family Medicine

## 2023-06-22 LAB — CBC WITH DIFFERENTIAL/PLATELET
Basophils Absolute: 0.1 10*3/uL (ref 0.0–0.2)
Basos: 1 %
EOS (ABSOLUTE): 0.1 10*3/uL (ref 0.0–0.4)
Eos: 1 %
Hematocrit: 41.4 % (ref 37.5–51.0)
Hemoglobin: 13.3 g/dL (ref 13.0–17.7)
Immature Grans (Abs): 0.1 10*3/uL (ref 0.0–0.1)
Immature Granulocytes: 1 %
Lymphocytes Absolute: 1.5 10*3/uL (ref 0.7–3.1)
Lymphs: 15 %
MCH: 30.7 pg (ref 26.6–33.0)
MCHC: 32.1 g/dL (ref 31.5–35.7)
MCV: 96 fL (ref 79–97)
Monocytes Absolute: 0.9 10*3/uL (ref 0.1–0.9)
Monocytes: 10 %
Neutrophils Absolute: 6.9 10*3/uL (ref 1.4–7.0)
Neutrophils: 72 %
Platelets: 213 10*3/uL (ref 150–450)
RBC: 4.33 x10E6/uL (ref 4.14–5.80)
RDW: 13.5 % (ref 11.6–15.4)
WBC: 9.5 10*3/uL (ref 3.4–10.8)

## 2023-06-22 LAB — MICROALBUMIN / CREATININE URINE RATIO
Creatinine, Urine: 73.4 mg/dL
Microalb/Creat Ratio: 37 mg/g{creat} — ABNORMAL HIGH (ref 0–29)
Microalbumin, Urine: 27.3 ug/mL

## 2023-06-22 LAB — COMPREHENSIVE METABOLIC PANEL WITH GFR
ALT: 14 IU/L (ref 0–44)
AST: 17 IU/L (ref 0–40)
Albumin: 4.1 g/dL (ref 3.7–4.7)
Alkaline Phosphatase: 107 IU/L (ref 44–121)
BUN/Creatinine Ratio: 13 (ref 10–24)
BUN: 14 mg/dL (ref 8–27)
Bilirubin Total: 0.4 mg/dL (ref 0.0–1.2)
CO2: 23 mmol/L (ref 20–29)
Calcium: 9.5 mg/dL (ref 8.6–10.2)
Chloride: 97 mmol/L (ref 96–106)
Creatinine, Ser: 1.1 mg/dL (ref 0.76–1.27)
Globulin, Total: 2.5 g/dL (ref 1.5–4.5)
Glucose: 87 mg/dL (ref 70–99)
Potassium: 4.7 mmol/L (ref 3.5–5.2)
Sodium: 138 mmol/L (ref 134–144)
Total Protein: 6.6 g/dL (ref 6.0–8.5)
eGFR: 67 mL/min/{1.73_m2} (ref >59)

## 2023-06-22 LAB — LIPID PANEL
Chol/HDL Ratio: 2.6 ratio (ref 0.0–5.0)
Cholesterol, Total: 156 mg/dL (ref 100–199)
HDL: 59 mg/dL (ref >39)
LDL Chol Calc (NIH): 78 mg/dL (ref 0–99)
Triglycerides: 105 mg/dL (ref 0–149)
VLDL Cholesterol Cal: 19 mg/dL (ref 5–40)

## 2023-06-22 LAB — HEMOGLOBIN A1C
Est. average glucose Bld gHb Est-mCnc: 126 mg/dL
Hgb A1c MFr Bld: 6 % — ABNORMAL HIGH (ref 4.8–5.6)

## 2023-06-23 ENCOUNTER — Other Ambulatory Visit: Payer: Self-pay

## 2023-06-23 DIAGNOSIS — E1122 Type 2 diabetes mellitus with diabetic chronic kidney disease: Secondary | ICD-10-CM

## 2023-06-23 MED ORDER — KERENDIA 10 MG PO TABS: 10.0000 mg | ORAL_TABLET | Freq: Every day | ORAL | 0 refills | Status: DC

## 2023-06-26 DIAGNOSIS — J410 Simple chronic bronchitis: Secondary | ICD-10-CM | POA: Insufficient documentation

## 2023-06-26 NOTE — Assessment & Plan Note (Addendum)
 Management per specialist. Continue Amlodipine  5 mg daily, Lasix  20 mg daily.

## 2023-06-26 NOTE — Assessment & Plan Note (Signed)
 Continue trelegy one inhalation daily. Change albuterol  to airsupra  2 puffs four times a day as needed.

## 2023-06-26 NOTE — Assessment & Plan Note (Signed)
 RECOMMEND DISCONTINUE ALBUTEROL  INHALER. USE AIRSUPRA  INHALER INSTEAD. YOU CAN TAKE 2 PUFFS EVERY 4 HOURS. RINSE MOUTH AFTER USE. CONTINUE TRELEGY.

## 2023-06-26 NOTE — Assessment & Plan Note (Signed)
 Well controlled. Continue toprol  xl, amlodipine  and Lasix .

## 2023-06-26 NOTE — Assessment & Plan Note (Signed)
Well controlled.  No changes to medicines. Atorvastatin 40 mg daily Continue to work on eating a healthy diet and exercise.  Labs drawn today.   

## 2023-06-26 NOTE — Assessment & Plan Note (Signed)
 Continue on levothyroxine  to 75 mcg once daily in am.

## 2023-06-26 NOTE — Assessment & Plan Note (Signed)
 The current medical regimen is effective;  continue present plan and medications.

## 2023-06-26 NOTE — Assessment & Plan Note (Signed)
 Controlled Denies any new or worsening symptoms Continue to monitor  Continue to follow up with cardiology

## 2023-06-26 NOTE — Assessment & Plan Note (Signed)
 Control: well controlled Recommend annual eye exams. Medicines: On Jardiance  to 25 mg daily.   Continue to work on eating a healthy diet and exercise.  Wash feet daily.   Check feet daily for sores.  Order diabetic shoes Recommend using stretchy diabetic socks to protect your feet and keep them clean. Labs drawn today.

## 2023-06-29 ENCOUNTER — Other Ambulatory Visit: Payer: Self-pay

## 2023-06-29 NOTE — Patient Instructions (Signed)
 Visit Information  Thank you for taking time to visit with me today. Please don't hesitate to contact me if I can be of assistance to you before our next scheduled appointment.  Our next appointment is by telephone on 07/27/23 at 10:00 Please call the care guide team at [phone] if you need to cancel or reschedule your appointment.   Following is a copy of your care plan:   Goals Addressed             This Visit's Progress    VBCI RN Care Plan   Improving    Problems:  Chronic Disease Management support and education needs related to CHF, COPD, and DMII  Goal: Over the next 30 days the Patient will attend all scheduled medical appointments:  Podiatry 6/23, 8/7 CVD as evidenced by chart review and patient reporting        continue to work with RN Care Manager and/or Social Worker to address care management and care coordination needs related to CHF, COPD, and DMII as evidenced by adherence to care management team scheduled appointments     demonstrate a decrease CHF, COPD, and DMII in exacerbations as evidenced by no hospital readmissions demonstrate Ongoing adherence to prescribed treatment plan for CHF, COPD, and DMII as evidenced by taking all medications as prescribed, attending all medical appointments, lifestyle and diet changes, considering smoking cessation take all medications exactly as prescribed and will call provider for medication related questions as evidenced by chart review and patient report    work with pharmacist to address medication adherence and lack of education about medication instructions related to CHF, COPD, and DMII as evidenced by review of electronic medical record and patient or pharmacist report     Interventions:   Heart Failure Interventions: Basic overview and discussion of pathophysiology of Heart Failure reviewed Provided education on low sodium diet Assessed need for readable accurate scales in home Provided education about placing scale on  hard, flat surface Advised patient to weigh each morning after emptying bladder Discussed importance of daily weight and advised patient to weigh and record daily Reviewed role of diuretics in prevention of fluid overload and management of heart failure; Discussed the importance of keeping all appointments with provider  COPD Interventions: Advised patient to track and manage COPD triggers Provided instruction about proper use of medications used for management of COPD including inhalers Provided education about and advised patient to utilize infection prevention strategies to reduce risk of respiratory infection Discussed the importance of adequate rest and management of fatigue with COPD Screening for signs and symptoms of depression related to chronic disease state  Instructed patient to schedule follow up appointment with pulmonology and cardiology   Diabetes Interventions: Assessed patient's understanding of A1c goal: <6.5% Reviewed medications with patient and discussed importance of medication adherence Counseled on importance of regular laboratory monitoring as prescribed Discussed plans with patient for ongoing care management follow up and provided patient with direct contact information for care management team Referral made to pharmacy team for assistance with polypharmacy Review of patient status, including review of consultants reports, relevant laboratory and other test results, and medications completed Assessed social determinant of health barriers Lab Results  Component Value Date   HGBA1C 6.0 (H) 06/21/2023    Patient Self-Care Activities:  Attend all scheduled provider appointments Attend church or other social activities Call pharmacy for medication refills 3-7 days in advance of running out of medications Call provider office for new concerns or questions  Perform all  self care activities independently  Take medications as prescribed   Work with the pharmacist  to address medication management needs and will continue to work with the clinical team to address health care and disease management related needs call office if I gain more than 2 pounds in one day or 5 pounds in one week keep legs up while sitting track weight in diary use salt in moderation watch for swelling in feet, ankles and legs every day weigh myself daily begin a heart failure diary bring diary to all appointments eliminate smoking in my home eliminate symptom triggers at home follow rescue plan if symptoms flare-up keep follow-up appointments: needs appointment get at least 7 to 8 hours of sleep at night use devices that will help like a cane, sock-puller or reacher Schedule followup visit with pulmonology  Plan:  Telephone follow up appointment with care management team member scheduled for:    07/27/23 at 10:00             Please call the Suicide and Crisis Lifeline: 988 call the [place]  National Suicide Prevention Lifeline: 256-776-7071 or TTY: 214-710-8701 TTY 681 305 1032) to talk to a trained counselor call 1-800-273-TALK (toll free, 24 hour hotline) if you are experiencing a Mental Health or Behavioral Health Crisis or need someone to talk to.  The patient verbalized understanding of instructions, educational materials, and care plan provided today and DECLINED offer to receive copy of patient instructions, educational materials, and care plan.    Clarnce Crow BSN RN CCM [place]  [facility], [facility] RN Care Manager Direct Dial: [phone] Fax: [phone]

## 2023-06-29 NOTE — Patient Outreach (Signed)
 Complex Care Management   Visit Note  06/29/2023  Name:  Kyle Farley MRN: [id number] DOB: 04-10-1940  Situation: Referral received for Complex Care Management related to Heart Failure I obtained verbal consent from Patient.  Visit completed with patient  on the phone  Background:   Past Medical History:  Diagnosis Date   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 (HCC) 11/10/2021   Acquired hypothyroidism 11/10/2021   Acute hypoxic respiratory failure (HCC) 04/25/2022   Acute on chronic diastolic heart failure (HCC) 04/25/2022   Acute on chronic respiratory failure with hypoxia (HCC) 04/25/2022   Acute on chronic systolic heart failure (HCC) 11/10/2021   AKI (acute kidney injury) (HCC) 06/08/2022   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/10/2021   Alcohol use 09/12/2020   Anemia of chronic disease 04/25/2022   Arthritis    Atherosclerosis of native arteries of extremities with intermittent claudication, bilateral legs (HCC) 11/10/2021   Bilateral leg edema 02/16/2022   BPH (benign prostatic hyperplasia) 11/10/2021   CHF (congestive heart failure) (HCC)    Chronic diastolic CHF (congestive heart failure) (HCC) 04/25/2022   Chronic hyponatremia 04/25/2022   Chronic respiratory failure with hypoxia (HCC) 05/09/2022   Cigarette smoker 10/21/2014   Class 1 obesity 04/25/2022   Cognitive communication deficit 03/05/2021   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)    Diabetic polyneuropathy associated with type 2 diabetes mellitus (HCC) 08/28/2022   Dyspnea    Dysrhythmia    atrial fibrillation   Elevated glucose 05/27/2022   Encounter for prostate cancer screening 02/16/2022   Encounter for screening for lung cancer 02/16/2022   Essential hypertension 10/21/2014   Femur fracture, right (HCC) 09/12/2020   GERD (gastroesophageal reflux disease)    History of kidney stones    Hyperlipidemia 01/24/2017   Hypertension    Hypo-osmolality and hyponatremia  03/05/2021   Hyponatremia 04/26/2022   Hypotension 08/05/2022   Kidney stones 01/24/2017   Laceration of right eyebrow 06/20/2022   Medication monitoring encounter 12/18/2022   Muscle wasting and atrophy, not elsewhere classified, multiple sites 03/05/2021   Nicotine  dependence, cigarettes, uncomplicated 03/05/2021   Non-seasonal allergic rhinitis due to pollen 06/06/2022   Other abnormalities of gait and mobility 03/05/2021   Other chest pain 12/18/2022   Other lack of coordination 03/05/2021   Oxygen  dependent 08/05/2022   Paroxysmal atrial fibrillation (HCC) 10/21/2014   Pressure injury of skin 09/16/2020   Right ureteral stone 06/08/2022   Sepsis secondary to UTI (HCC) 06/08/2022   SOB (shortness of breath) 05/10/2022   Syncope and collapse 08/05/2022   Tobacco abuse 02/16/2022   Tobacco use disorder 02/16/2022   Type 2 diabetes mellitus (HCC) 06/08/2022   Visit for suture removal 06/15/2022    Assessment: Patient Reported Symptoms:  Cognitive Cognitive Status: Alert and oriented to person, place, and time      Neurological Neurological Review of Symptoms:  (diabetic neuropathy)    HEENT HEENT Symptoms Reported: Change or loss of hearing      Cardiovascular Cardiovascular Symptoms Reported: Swelling in legs or feet Does patient have uncontrolled Hypertension?: Yes Is patient checking Blood Pressure at home?: No Cardiovascular Conditions: Hypertension, Heart failure Cardiovascular Management Strategies: Routine screening Do You Have a Working Readable Scale?: Yes Weight: 220 lb (99.8 kg)  Respiratory Respiratory Symptoms Reported: Wheezing, Shortness of breath Other Respiratory Symptoms: patient reports slight improvement with new inhaler, uses 3L as needed Respiratory Conditions: COPD  Endocrine Patient reports the following symptoms related to hypoglycemia or hyperglycemia : No  symptoms reported Is patient diabetic?: Yes Is patient checking blood sugars at home?:  No Endocrine Conditions: Diabetes Endocrine Management Strategies: Weight management, Medication therapy, Diet modification  Gastrointestinal Gastrointestinal Symptoms Reported: No symptoms reported      Genitourinary Genitourinary Symptoms Reported: No symptoms reported    Integumentary Integumentary Symptoms Reported: No symptoms reported    Musculoskeletal Musculoskelatal Symptoms Reviewed: Difficulty walking Additional Musculoskeletal Details: uses scooter outdoors Musculoskeletal Conditions: Joint pain Musculoskeletal Management Strategies: Adequate rest, Routine screening, Coping strategies Musculoskeletal Self-Management Outcome: 4 (good) Falls in the past year?: No    Psychosocial Psychosocial Symptoms Reported: No symptoms reported            06/21/2023   10:19 AM  Depression screen PHQ 2/9  Decreased Interest 0  Down, Depressed, Hopeless 0  PHQ - 2 Score 0    There were no vitals filed for this visit.  Medications Reviewed Today     Reviewed by Clarnce Crow, RN (Registered Nurse) on 06/29/23 at 1041  Med List Status: <None>   Medication Order Taking? Sig Documenting Provider Last Dose Status Informant  Albuterol -Budesonide  (AIRSUPRA ) 90-80 MCG/ACT AERO [id number] Yes Inhale 2 puffs into the lungs every 4 (four) hours as needed. [name], Kirsten, MD Taking Active   amiodarone  (PACERONE ) 200 MG tablet [id number] Yes Take 1 tablet (200 mg total) by mouth daily.  Patient taking differently: Take 200 mg by mouth 2 (two) times daily.   [name], Kirsten, MD Taking Active   amLODipine  (NORVASC ) 5 MG tablet [id number] Yes Take 1 tablet by mouth once daily [name], Kirsten, MD Taking Active   apixaban  (ELIQUIS ) 5 MG TABS tablet [id number] Yes Take 1 tablet (5 mg total) by mouth 2 (two) times daily. [name], Kirsten, MD Taking Active   atorvastatin  (LIPITOR) 40 MG tablet [id number] Yes Take 1 tablet by mouth once daily Terrance Ferretti, NP Taking Active   Docusate Sodium  (STOOL SOFTENER  LAXATIVE PO) [id number]  Take 1 tablet by mouth daily. [provider]  Active   ferrous sulfate  325 (65 FE) MG EC tablet [id number] Yes Take 1 tablet (325 mg total) by mouth daily. Odilia Bennett, PA Taking Active   Finerenone  (KERENDIA ) 10 MG TABS [id number]  Take 1 tablet (10 mg total) by mouth daily. [name], Kirsten, MD  Active   furosemide  (LASIX ) 40 MG tablet [id number] Yes Take 1 tablet (40 mg total) by mouth daily. Mercy Stall, MD Taking Active   JARDIANCE  25 MG TABS tablet [id number] Yes TAKE 1 TABLET BY MOUTH ONCE DAILY BEFORE BREAKFAST [name], Kirsten, MD Taking Active   levothyroxine  (SYNTHROID ) 75 MCG tablet [id number] Yes Take 1 tablet (75 mcg total) by mouth daily. [name], Kirsten, MD Taking Active   metoprolol  succinate (TOPROL -XL) 50 MG 24 hr tablet [id number] Yes Take 12.5 mg by mouth daily. Take with or immediately following a meal. [provider] Taking Active Self           Med Note Oletta Berry, ASHLEY M   Thu Mar 24, 2023 10:07 AM) Patient taking differently. Taking one (50 mg) tablet daily  montelukast  (SINGULAIR ) 10 MG tablet [id number] Yes Take 1 tablet (10 mg total) by mouth daily at 12 noon. [name], Kirsten, MD Taking Active Self  nitroGLYCERIN  (NITROSTAT ) 0.4 MG SL tablet [id number] Yes DISSOLVE ONE TABLET UNDER THE TONGUE EVERY 5 MINUTES AS NEEDED FOR CHEST PAIN.  DO NOT EXCEED A TOTAL OF 3 DOSES IN 15 MINUTES Mercy Stall, MD Taking Active   OXYGEN  [id number] Yes Inhale 3 L into  the lungs as needed (shortness of breath). [provider] Taking Active Self  pantoprazole  (PROTONIX ) 40 MG tablet [id number] Yes Take 40 mg by mouth daily. [provider] Taking Active   tamsulosin  (FLOMAX ) 0.4 MG CAPS capsule [id number] Yes Take 1 capsule by mouth once daily Mercy Stall, MD Taking Active   TRELEGY ELLIPTA  200-62.5-25 MCG/ACT AEPB [id number] Yes INHALE 2 PUFFS ONCE DAILY Sirivol, Mamatha, MD Taking Active             Recommendation:   Continue Current Plan of  Care  Follow Up Plan:   Telephone follow up appointment date/time:  07/27/23   Clarnce Crow BSN RN CCM [place]  [facility], [facility] RN Care Manager Direct Dial: [phone] Fax: [phone]

## 2023-07-05 ENCOUNTER — Other Ambulatory Visit: Payer: Self-pay | Admitting: Nurse Practitioner

## 2023-07-06 ENCOUNTER — Other Ambulatory Visit: Payer: Self-pay

## 2023-07-06 DIAGNOSIS — J449 Chronic obstructive pulmonary disease, unspecified: Secondary | ICD-10-CM

## 2023-07-09 ENCOUNTER — Other Ambulatory Visit: Payer: Self-pay | Admitting: Family Medicine

## 2023-07-11 ENCOUNTER — Ambulatory Visit (INDEPENDENT_AMBULATORY_CARE_PROVIDER_SITE_OTHER): Admitting: Podiatry

## 2023-07-11 DIAGNOSIS — M79675 Pain in left toe(s): Secondary | ICD-10-CM | POA: Diagnosis not present

## 2023-07-11 DIAGNOSIS — E1142 Type 2 diabetes mellitus with diabetic polyneuropathy: Secondary | ICD-10-CM | POA: Diagnosis not present

## 2023-07-11 DIAGNOSIS — M79674 Pain in right toe(s): Secondary | ICD-10-CM | POA: Diagnosis not present

## 2023-07-11 DIAGNOSIS — B351 Tinea unguium: Secondary | ICD-10-CM

## 2023-07-11 NOTE — Progress Notes (Unsigned)
  Subjective:  Patient ID: Kyle Farley, male    DOB: Oct 17, 1940,  MRN: [id number]  Chief Complaint  Patient presents with   [facility]    [facility] with out callous. A1c was 6.0, two weeks ago. Elliquis.     83 y.o. male presents with the above complaint. History confirmed with patient. Patient presenting with pain related to dystrophic thickened elongated nails. Patient is unable to trim own nails related to nail dystrophy and/or mobility issues. Patient does  have a history of T2DM.  Last A1c 6.1.  Is on chronic Eliquis .  He reports neuropathic pain.  Objective:  Physical exam: Warm, cap refill approximately 3 seconds to the digits, pedal hair growth absent.  Pedal skin atrophic, xerotic.  Webspaces clean and dry nail exam onychomycosis of the toenails, onycholysis, and dystrophic nails, within normal limits for length today. DP pulses palpable, protective sensation absent, and vibratory sensation absent, PT pulses faintly palpable.  Mild +1 pitting edema Left Foot:  Pain with palpation of nails due to elongation and dystrophic growth.  Right Foot: Pain with palpation of nails due to elongation and dystrophic growth.  Assessment:   1. Diabetic polyneuropathy associated with type 2 diabetes mellitus (HCC)   2. Pain due to onychomycosis of toenails of both feet       Plan:  Patient was evaluated and treated and all questions answered.  # Diabetic neuropathy -Pending approval for Qutenza    #Onychomycosis with pain  -Nails palliatively debrided as below. -Educated on self-care  Procedure: Nail Debridement Rationale: Pain Type of Debridement: manual, sharp debridement. Instrumentation: Nail nipper, rotary burr. Number of Nails: 10  Return in about 3 months (around 10/11/2023) for Diabetic Foot Care.         Ethan Saddler, DPM [facility] / [facility]

## 2023-07-13 ENCOUNTER — Other Ambulatory Visit: Payer: Self-pay | Admitting: Family Medicine

## 2023-07-13 ENCOUNTER — Encounter: Payer: Self-pay | Admitting: Podiatry

## 2023-07-15 ENCOUNTER — Encounter: Payer: Self-pay | Admitting: Cardiology

## 2023-07-19 ENCOUNTER — Other Ambulatory Visit: Payer: Self-pay

## 2023-07-19 DIAGNOSIS — N289 Disorder of kidney and ureter, unspecified: Secondary | ICD-10-CM

## 2023-07-21 ENCOUNTER — Other Ambulatory Visit

## 2023-07-21 DIAGNOSIS — N289 Disorder of kidney and ureter, unspecified: Secondary | ICD-10-CM | POA: Diagnosis not present

## 2023-07-21 LAB — COMPREHENSIVE METABOLIC PANEL WITH GFR
ALT: 16 IU/L (ref 0–44)
AST: 18 IU/L (ref 0–40)
Albumin: 4 g/dL (ref 3.7–4.7)
Alkaline Phosphatase: 112 IU/L (ref 44–121)
BUN/Creatinine Ratio: 11 (ref 10–24)
BUN: 13 mg/dL (ref 8–27)
Bilirubin Total: 0.6 mg/dL (ref 0.0–1.2)
CO2: 27 mmol/L (ref 20–29)
Calcium: 9.3 mg/dL (ref 8.6–10.2)
Chloride: 96 mmol/L (ref 96–106)
Creatinine, Ser: 1.14 mg/dL (ref 0.76–1.27)
Globulin, Total: 2.7 g/dL (ref 1.5–4.5)
Glucose: 89 mg/dL (ref 70–99)
Potassium: 4 mmol/L (ref 3.5–5.2)
Sodium: 141 mmol/L (ref 134–144)
Total Protein: 6.7 g/dL (ref 6.0–8.5)
eGFR: 65 mL/min/{1.73_m2} (ref >59)

## 2023-07-25 ENCOUNTER — Ambulatory Visit: Payer: Self-pay | Admitting: Family Medicine

## 2023-07-26 ENCOUNTER — Ambulatory Visit: Admitting: Podiatry

## 2023-07-26 ENCOUNTER — Other Ambulatory Visit: Payer: Self-pay

## 2023-07-26 DIAGNOSIS — E1142 Type 2 diabetes mellitus with diabetic polyneuropathy: Secondary | ICD-10-CM | POA: Diagnosis not present

## 2023-07-26 DIAGNOSIS — B353 Tinea pedis: Secondary | ICD-10-CM

## 2023-07-26 DIAGNOSIS — N289 Disorder of kidney and ureter, unspecified: Secondary | ICD-10-CM

## 2023-07-26 MED ORDER — KERENDIA 20 MG PO TABS: 20.0000 mg | ORAL_TABLET | Freq: Every day | ORAL | 0 refills | Status: DC

## 2023-07-26 MED ORDER — CASTELLANI PAINT 1.5 % EX LIQD: 1.0000 | Freq: Two times a day (BID) | CUTANEOUS | 0 refills | Status: AC

## 2023-07-26 NOTE — Progress Notes (Signed)
 Kerendia  20 mg sent to pharmacy.

## 2023-07-26 NOTE — Progress Notes (Unsigned)
  Subjective:  Patient ID: Kyle Farley, male    DOB: 1940-09-29,  MRN: [id number]  Chief Complaint  Patient presents with   Qutenza     Qutenza  treatment    83 y.o. male presents with the above complaint.  He comes in today for Qutenza  treatment for diabetic neuropathy.  He is also presenting with increased interdigital maceration today.  Objective:  Physical exam: Warm, cap refill approximately 3 seconds to the digits, pedal hair growth absent.  Pedal skin atrophic, xerotic.  Maceration present to the webspaces mostly the 3rd and 4th webspaces bilaterally.  No significant skin breakdown. nail exam onychomycosis of the toenails, onycholysis, and dystrophic nails, within normal limits for length today. DP pulses palpable, PT pulses faintly palpable.  Mild +1 pitting edema protective sensation vibratory sensation decreased, subjective paresthesias. Left Foot:  Pain with palpation of nails due to elongation and dystrophic growth.  Right Foot: Pain with palpation of nails due to elongation and dystrophic growth.  Assessment:   1. Diabetic polyneuropathy associated with type 2 diabetes mellitus (HCC)   2. Tinea pedis of both feet       Plan:  Patient was evaluated and treated and all questions answered.  # Diabetic neuropathy - Verbal consent was obtained to administer Qutenza  treatment - Left and right foot were washed with soapy water prior to treatment.  They were allowed to dry completely before initiating treatment.  2 sheets of the Qutenza  were applied to each foot taking care to cover area of maximum severity of symptoms.  The sheaths were then secured in place using Coban. - The sheets were allowed to remain in place for 30 minutes.  They were then promptly removed and the feet were cleansed with the manufacture provided cleansing solution. - Patient tolerated the procedure well without any adverse effects or reaction.   # Tinea pedis - Interdigital maceration present -  Castellani's paint was applied to the bilateral affected webspaces.  Prescription sent into patient's pharmacy to be applied as needed 1-2 times a day.  Return in about 3 months (around 10/26/2023) for DFC/Qutenza .         Ethan Saddler, DPM [facility] / [facility]

## 2023-07-27 ENCOUNTER — Other Ambulatory Visit: Payer: Self-pay

## 2023-07-27 NOTE — Patient Instructions (Signed)
 Visit Information  Thank you for taking time to visit with me today. Please don't hesitate to contact me if I can be of assistance to you before our next scheduled appointment.  Our next appointment is by telephone on 08/08/23 at 2:00 Please call the care guide team at [phone] if you need to cancel or reschedule your appointment.   Following is a copy of your care plan:   Goals Addressed             This Visit's Progress    VBCI RN Care Plan   Worsening    Problems:  Chronic Disease Management support and education needs related to CHF, COPD, and DMII  Goal: Over the next 30 days the Patient will attend all scheduled medical appointments:  09/09/23 CVD as evidenced by chart review and patient reporting        continue to work with RN Care Manager and/or Social Worker to address care management and care coordination needs related to CHF, COPD, and DMII as evidenced by adherence to care management team scheduled appointments     demonstrate a decrease CHF, COPD, and DMII in exacerbations as evidenced by no hospital readmissions demonstrate Ongoing adherence to prescribed treatment plan for CHF, COPD, and DMII as evidenced by taking all medications as prescribed, attending all medical appointments, lifestyle and diet changes, considering smoking cessation take all medications exactly as prescribed and will call provider for medication related questions as evidenced by chart review and patient report    work with pharmacist to address medication adherence and lack of education about medication instructions related to CHF, COPD, and DMII as evidenced by review of electronic medical record and patient or pharmacist report     Interventions:   Heart Failure Interventions: Basic overview and discussion of pathophysiology of Heart Failure reviewed Provided education on low sodium diet Assessed need for readable accurate scales in home Provided education about placing scale on hard, flat  surface Advised patient to weigh each morning after emptying bladder Discussed importance of daily weight and advised patient to weigh and record daily Reviewed role of diuretics in prevention of fluid overload and management of heart failure; Discussed the importance of keeping all appointments with provider Assisted patient to reschedule cancelled cardiology appointment for 09/09/23.   Message sent to provider regarding 18 lb weight gain, increased edema in LE, BP of 153/63  COPD Interventions: Advised patient to track and manage COPD triggers Provided instruction about proper use of medications used for management of COPD including inhalers Provided education about and advised patient to utilize infection prevention strategies to reduce risk of respiratory infection Discussed the importance of adequate rest and management of fatigue with COPD Screening for signs and symptoms of depression related to chronic disease state       Diabetes Interventions: Assessed patient's understanding of A1c goal: <6.5% Reviewed medications with patient and discussed importance of medication adherence Counseled on importance of regular laboratory monitoring as prescribed Discussed plans with patient for ongoing care management follow up and provided patient with direct contact information for care management team Referral made to pharmacy team for assistance with polypharmacy Review of patient status, including review of consultants reports, relevant laboratory and other test results, and medications completed Assessed social determinant of health barriers Lab Results  Component Value Date   HGBA1C 6.0 (H) 06/21/2023    Patient Self-Care Activities:  Attend all scheduled provider appointments Attend church or other social activities Call pharmacy for medication refills 3-7 days in  advance of running out of medications Call provider office for new concerns or questions  Perform all self care  activities independently  Take medications as prescribed   Work with the pharmacist to address medication management needs and will continue to work with the clinical team to address health care and disease management related needs call office if I gain more than 2 pounds in one day or 5 pounds in one week keep legs up while sitting track weight in diary use salt in moderation watch for swelling in feet, ankles and legs every day weigh myself daily begin a heart failure diary bring diary to all appointments eliminate smoking in my home eliminate symptom triggers at home follow rescue plan if symptoms flare-up keep follow-up appointments: needs appointment get at least 7 to 8 hours of sleep at night use devices that will help like a cane, sock-puller or reacher Schedule followup visit with pulmonology  Plan:  Telephone follow up appointment with care management team member scheduled for:    08/08/23 at 2:00             Please call the Suicide and Crisis Lifeline: 988 call the [place]  National Suicide Prevention Lifeline: 336-883-1451 or TTY: (762) 050-0645 TTY 786-478-8319) to talk to a trained counselor call 1-800-273-TALK (toll free, 24 hour hotline) if you are experiencing a Mental Health or Behavioral Health Crisis or need someone to talk to.  Patient verbalizes understanding of instructions and care plan provided today and agrees to view in MyChart. Active MyChart status and patient understanding of how to access instructions and care plan via MyChart confirmed with patient.     SIGNATURE  Olam Idol BSN RN CCM [place]  [facility], [facility] RN Care Manager Direct Dial: [phone] Fax: [phone]

## 2023-07-27 NOTE — Patient Outreach (Signed)
 RNCM Telephone Encounter Note  As per provider instructions, called and spoke to patient son Kiki and requested that patient call clinic to schedule visit to discuss weight gain of 18 lbs in 30 days and LE edema.     Olam Idol BSN RN CCM [place]  [facility], [facility] RN Care Manager Direct Dial: [phone] Fax: [phone]

## 2023-07-27 NOTE — Patient Outreach (Signed)
 Complex Care Management   Visit Note  07/27/2023  Name:  Kyle Farley MRN: [id number] DOB: March 30, 1940  Situation: Referral received for Complex Care Management related to Heart Failure and COPD I obtained verbal consent from Patient.  Visit completed with patient  on the phone  Background:   Past Medical History:  Diagnosis Date   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 (HCC) 11/10/2021   Acquired hypothyroidism 11/10/2021   Acute hypoxic respiratory failure (HCC) 04/25/2022   Acute on chronic diastolic heart failure (HCC) 04/25/2022   Acute on chronic respiratory failure with hypoxia (HCC) 04/25/2022   Acute on chronic systolic heart failure (HCC) 11/10/2021   AKI (acute kidney injury) (HCC) 06/08/2022   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/10/2021   Alcohol use 09/12/2020   Anemia of chronic disease 04/25/2022   Arthritis    Atherosclerosis of native arteries of extremities with intermittent claudication, bilateral legs (HCC) 11/10/2021   Bilateral leg edema 02/16/2022   BPH (benign prostatic hyperplasia) 11/10/2021   CHF (congestive heart failure) (HCC)    Chronic diastolic CHF (congestive heart failure) (HCC) 04/25/2022   Chronic hyponatremia 04/25/2022   Chronic respiratory failure with hypoxia (HCC) 05/09/2022   Cigarette smoker 10/21/2014   Class 1 obesity 04/25/2022   Cognitive communication deficit 03/05/2021   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)    Diabetic polyneuropathy associated with type 2 diabetes mellitus (HCC) 08/28/2022   Dyspnea    Dysrhythmia    atrial fibrillation   Elevated glucose 05/27/2022   Encounter for prostate cancer screening 02/16/2022   Encounter for screening for lung cancer 02/16/2022   Essential hypertension 10/21/2014   Femur fracture, right (HCC) 09/12/2020   GERD (gastroesophageal reflux disease)    History of kidney stones    Hyperlipidemia 01/24/2017   Hypertension    Hypo-osmolality and  hyponatremia 03/05/2021   Hyponatremia 04/26/2022   Hypotension 08/05/2022   Kidney stones 01/24/2017   Laceration of right eyebrow 06/20/2022   Medication monitoring encounter 12/18/2022   Muscle wasting and atrophy, not elsewhere classified, multiple sites 03/05/2021   Nicotine  dependence, cigarettes, uncomplicated 03/05/2021   Non-seasonal allergic rhinitis due to pollen 06/06/2022   Other abnormalities of gait and mobility 03/05/2021   Other chest pain 12/18/2022   Other lack of coordination 03/05/2021   Oxygen  dependent 08/05/2022   Paroxysmal atrial fibrillation (HCC) 10/21/2014   Pressure injury of skin 09/16/2020   Right ureteral stone 06/08/2022   Sepsis secondary to UTI (HCC) 06/08/2022   SOB (shortness of breath) 05/10/2022   Syncope and collapse 08/05/2022   Tobacco abuse 02/16/2022   Tobacco use disorder 02/16/2022   Type 2 diabetes mellitus (HCC) 06/08/2022   Visit for suture removal 06/15/2022    Assessment: Patient Reported Symptoms:  Cognitive Cognitive Status: No symptoms reported, Alert and oriented to person, place, and time      Neurological Neurological Review of Symptoms: Numbness (bilateral foot numbness, seeing podiatry)    HEENT HEENT Symptoms Reported: Change or loss of hearing (patient reports had hearing aids but he threw them away due to feedback) HEENT Management Strategies: Routine screening Ear problem(s)  Cardiovascular Cardiovascular Symptoms Reported: Swelling in legs or feet (increased swelling in feet and legs, weight increase) Does patient have uncontrolled Hypertension?: Yes Is patient checking Blood Pressure at home?: No Cardiovascular Management Strategies: Routine screening Do You Have a Working Readable Scale?: Yes Weight: 228 lb (103.4 kg)  Respiratory Respiratory Symptoms Reported: Wheezing, Shortness of breath Other Respiratory Symptoms: patient  reports symptoms about the same Respiratory Management Strategies: Routine  screening, Oxygen  therapy, Medication therapy  Endocrine Endocrine Symptoms Reported: No symptoms reported Is patient diabetic?: No Is patient checking blood sugars at home?: Yes    Gastrointestinal Gastrointestinal Symptoms Reported: No symptoms reported      Genitourinary Genitourinary Symptoms Reported: No symptoms reported, Frequency Additional Genitourinary Details: frequency due to diuretics    Integumentary Integumentary Symptoms Reported: No symptoms reported    Musculoskeletal Musculoskelatal Symptoms Reviewed: Difficulty walking Additional Musculoskeletal Details: foot pain due to neuropathy, seeing podiatry Musculoskeletal Management Strategies: Routine screening, Medication therapy, Medical device Falls in the past year?: No    Psychosocial Psychosocial Symptoms Reported: No symptoms reported            06/21/2023   10:19 AM  Depression screen PHQ 2/9  Decreased Interest 0  Down, Depressed, Hopeless 0  PHQ - 2 Score 0    Vitals:   07/27/23 0938  BP: (!) 153/63  Pulse: (!) 59    Medications Reviewed Today     Reviewed by Lonzell Planas, RN (Registered Nurse) on 07/27/23 at [phone]  Med List Status: <None>   Medication Order Taking? Sig Documenting Provider Last Dose Status Informant  Albuterol -Budesonide  (AIRSUPRA ) 90-80 MCG/ACT AERO [id number]  Inhale 2 puffs into the lungs every 4 (four) hours as needed. [name], Kirsten, MD  Active   amiodarone  (PACERONE ) 200 MG tablet [id number]  Take 1 tablet (200 mg total) by mouth daily.  Patient taking differently: Take 200 mg by mouth 2 (two) times daily.   [name], Kirsten, MD  Active   amLODipine  (NORVASC ) 5 MG tablet [id number]  Take 1 tablet by mouth once daily [name], Kirsten, MD  Active   apixaban  (ELIQUIS ) 5 MG TABS tablet [id number]  Take 1 tablet (5 mg total) by mouth 2 (two) times daily. [name], Kirsten, MD  Active   atorvastatin  (LIPITOR) 40 MG tablet [id number]  Take 1 tablet by mouth once daily Carlin Delon BROCKS, NP  Active    Ferd Paint 1.5 % LIQD [id number]  Apply 1 Application topically 2 (two) times daily for 14 days. Lamount Ethan CROME, DPM  Active   Docusate Sodium  (STOOL SOFTENER LAXATIVE PO) [id number]  Take 1 tablet by mouth daily. [provider]  Active   ferrous sulfate  325 (65 FE) MG EC tablet [id number]  Take 1 tablet (325 mg total) by mouth daily. Milon Cleaves, PA  Active   Finerenone  (KERENDIA ) 20 MG TABS [id number]  Take 1 tablet (20 mg total) by mouth daily. [name], Kirsten, MD  Active   furosemide  (LASIX ) 40 MG tablet [id number]  Take 1 tablet (40 mg total) by mouth daily. Sherre Clapper, MD  Active   JARDIANCE  25 MG TABS tablet [id number]  TAKE 1 TABLET BY MOUTH ONCE DAILY BEFORE OFILIA Sherre, Kirsten, MD  Active   levothyroxine  (SYNTHROID ) 75 MCG tablet [id number]  Take 1 tablet (75 mcg total) by mouth daily. [name], Kirsten, MD  Active   metoprolol  succinate (TOPROL -XL) 50 MG 24 hr tablet [id number]  TAKE 1 TABLET BY MOUTH ONCE DAILY -  TAKE  WITH  OR  IMMEDIATELY  FOLLOWING  A  MEAL Munley, Redell PARAS, MD  Active   montelukast  (SINGULAIR ) 10 MG tablet [id number]  TAKE 1 TABLET BY MOUTH ONCE DAILY AT  12  CURLY Sherre, Kirsten, MD  Active   nitroGLYCERIN  (NITROSTAT ) 0.4 MG SL tablet [id number]  DISSOLVE ONE TABLET UNDER THE TONGUE EVERY 5 MINUTES AS NEEDED FOR CHEST PAIN.  DO  NOT EXCEED A TOTAL OF 3 DOSES IN 15 MINUTES [name], Kirsten, MD  Active   OXYGEN  [id number]  Inhale 3 L into the lungs as needed (shortness of breath). [provider]  Active Self  pantoprazole  (PROTONIX ) 40 MG tablet [id number]  Take 1 tablet by mouth once daily [name], Kirsten, MD  Active   tamsulosin  (FLOMAX ) 0.4 MG CAPS capsule [id number]  Take 1 capsule by mouth once daily Sherre Clapper, MD  Active   TRELEGY ELLIPTA  200-62.5-25 MCG/ACT AEPB [id number]  INHALE 2 PUFFS BY MOUTH ONCE DAILY [name], Kirsten, MD  Active             Recommendation:   PCP Follow-up  Message sent to PCP re weight gain, elevated BP and LE edema.  Follow Up  Plan:   Telephone follow up appointment date/time:  08/08/23 at 2:00  SIG  Olam Idol BSN RN CCM [place]  [facility], [facility] RN Care Manager Direct Dial: [phone] Fax: [phone]

## 2023-07-28 ENCOUNTER — Encounter: Payer: Self-pay | Admitting: Podiatry

## 2023-08-04 ENCOUNTER — Other Ambulatory Visit: Payer: Self-pay | Admitting: Family Medicine

## 2023-08-04 DIAGNOSIS — Z7951 Long term (current) use of inhaled steroids: Secondary | ICD-10-CM

## 2023-08-08 ENCOUNTER — Other Ambulatory Visit: Payer: Self-pay

## 2023-08-08 NOTE — Patient Outreach (Signed)
 Complex Care Management   Visit Note  08/08/2023  Name:  Kyle Farley MRN: [id number] DOB: [birth year]  Situation: Referral received for Complex Care Management related to Heart Failure and HTN I obtained verbal consent from Patient.  Visit completed with patient  on the phone  Background:   Past Medical History:  Diagnosis Date   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 (HCC) 11/10/2021   Acquired hypothyroidism 11/10/2021   Acute hypoxic respiratory failure (HCC) 04/25/2022   Acute on chronic diastolic heart failure (HCC) 04/25/2022   Acute on chronic respiratory failure with hypoxia (HCC) 04/25/2022   Acute on chronic systolic heart failure (HCC) 11/10/2021   AKI (acute kidney injury) (HCC) 06/08/2022   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/10/2021   Alcohol use 09/12/2020   Anemia of chronic disease 04/25/2022   Arthritis    Atherosclerosis of native arteries of extremities with intermittent claudication, bilateral legs (HCC) 11/10/2021   Bilateral leg edema 02/16/2022   BPH (benign prostatic hyperplasia) 11/10/2021   CHF (congestive heart failure) (HCC)    Chronic diastolic CHF (congestive heart failure) (HCC) 04/25/2022   Chronic hyponatremia 04/25/2022   Chronic respiratory failure with hypoxia (HCC) 05/09/2022   Cigarette smoker 10/21/2014   Class 1 obesity 04/25/2022   Cognitive communication deficit 03/05/2021   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)    Diabetic polyneuropathy associated with type 2 diabetes mellitus (HCC) 08/28/2022   Dyspnea    Dysrhythmia    atrial fibrillation   Elevated glucose 05/27/2022   Encounter for prostate cancer screening 02/16/2022   Encounter for screening for lung cancer 02/16/2022   Essential hypertension 10/21/2014   Femur fracture, right (HCC) 09/12/2020   GERD (gastroesophageal reflux disease)    History of kidney stones    Hyperlipidemia 01/24/2017   Hypertension    Hypo-osmolality and  hyponatremia 03/05/2021   Hyponatremia 04/26/2022   Hypotension 08/05/2022   Kidney stones 01/24/2017   Laceration of right eyebrow 06/20/2022   Medication monitoring encounter 12/18/2022   Muscle wasting and atrophy, not elsewhere classified, multiple sites 03/05/2021   Nicotine  dependence, cigarettes, uncomplicated 03/05/2021   Non-seasonal allergic rhinitis due to pollen 06/06/2022   Other abnormalities of gait and mobility 03/05/2021   Other chest pain 12/18/2022   Other lack of coordination 03/05/2021   Oxygen  dependent 08/05/2022   Paroxysmal atrial fibrillation (HCC) 10/21/2014   Pressure injury of skin 09/16/2020   Right ureteral stone 06/08/2022   Sepsis secondary to UTI (HCC) 06/08/2022   SOB (shortness of breath) 05/10/2022   Syncope and collapse 08/05/2022   Tobacco abuse 02/16/2022   Tobacco use disorder 02/16/2022   Type 2 diabetes mellitus (HCC) 06/08/2022   Visit for suture removal 06/15/2022    Assessment: Patient Reported Symptoms:  Cognitive Cognitive Status: No symptoms reported, Alert and oriented to person, place, and time      Neurological Neurological Review of Symptoms: Numbness Neurological Management Strategies: Routine screening  HEENT HEENT Symptoms Reported: Change or loss of hearing HEENT Management Strategies: Routine screening    Cardiovascular Cardiovascular Symptoms Reported: Swelling in legs or feet (patient has decreased fluid intake, reports LE edema is reduced) Does patient have uncontrolled Hypertension?: Yes Is patient checking Blood Pressure at home?: No Cardiovascular Management Strategies: Medication therapy, Routine screening Do You Have a Working Readable Scale?: Yes Cardiovascular Self-Management Outcome: 3 (uncertain)  Respiratory Respiratory Symptoms Reported: Wheezing, Shortness of breath    Endocrine Endocrine Symptoms Reported: No symptoms reported Is patient diabetic?: Yes Is  patient checking blood sugars at home?:  No    Gastrointestinal Gastrointestinal Symptoms Reported: No symptoms reported      Genitourinary Genitourinary Symptoms Reported: No symptoms reported, Frequency    Integumentary Integumentary Symptoms Reported: No symptoms reported    Musculoskeletal Musculoskelatal Symptoms Reviewed: Difficulty walking Musculoskeletal Management Strategies: Medication therapy, Routine screening Falls in the past year?: No    Psychosocial Psychosocial Symptoms Reported: No symptoms reported     Quality of Family Relationships: helpful, involved, supportive Do you feel physically threatened by others?: No      06/21/2023   10:19 AM  Depression screen PHQ 2/9  Decreased Interest 0  Down, Depressed, Hopeless 0  PHQ - 2 Score 0    There were no vitals filed for this visit.  Medications Reviewed Today     Reviewed by Lonzell Planas, RN (Registered Nurse) on 08/08/23 at 1231  Med List Status: <None>   Medication Order Taking? Sig Documenting Provider Last Dose Status Informant  Albuterol -Budesonide  (AIRSUPRA ) 90-80 MCG/ACT AERO [id number]  Inhale 2 puffs into the lungs every 4 (four) hours as needed. [name], Kirsten, MD  Active   amiodarone  (PACERONE ) 200 MG tablet [id number]  Take 1 tablet (200 mg total) by mouth daily.  Patient taking differently: Take 200 mg by mouth 2 (two) times daily.   [name], Kirsten, MD  Active   amLODipine  (NORVASC ) 5 MG tablet [id number]  Take 1 tablet by mouth once daily [name], Kirsten, MD  Active   apixaban  (ELIQUIS ) 5 MG TABS tablet [id number]  Take 1 tablet (5 mg total) by mouth 2 (two) times daily. [name], Kirsten, MD  Active   atorvastatin  (LIPITOR) 40 MG tablet [id number]  Take 1 tablet by mouth once daily Carlin Delon BROCKS, NP  Active   Ferd Paint 1.5 % LIQD [id number]  Apply 1 Application topically 2 (two) times daily for 14 days. Lamount Ethan CROME, DPM  Active   Docusate Sodium  (STOOL SOFTENER LAXATIVE PO) [id number]  Take 1 tablet by mouth daily. [provider]   Active   ferrous sulfate  325 (65 FE) MG EC tablet [id number]  Take 1 tablet (325 mg total) by mouth daily. Milon Cleaves, PA  Active   Finerenone  (KERENDIA ) 20 MG TABS [id number]  Take 1 tablet (20 mg total) by mouth daily. [name], Kirsten, MD  Active   furosemide  (LASIX ) 40 MG tablet [id number]  Take 1 tablet (40 mg total) by mouth daily. Sherre Clapper, MD  Active   JARDIANCE  25 MG TABS tablet [id number]  TAKE 1 TABLET BY MOUTH ONCE DAILY BEFORE BREAKFAST [name], Kirsten, MD  Active   levothyroxine  (SYNTHROID ) 75 MCG tablet [id number]  Take 1 tablet (75 mcg total) by mouth daily. [name], Kirsten, MD  Active   metoprolol  succinate (TOPROL -XL) 50 MG 24 hr tablet [id number]  TAKE 1 TABLET BY MOUTH ONCE DAILY -  TAKE  WITH  OR  IMMEDIATELY  FOLLOWING  A  MEAL Munley, Redell PARAS, MD  Active   montelukast  (SINGULAIR ) 10 MG tablet [id number]  TAKE 1 TABLET BY MOUTH ONCE DAILY AT  12  CURLY Sherre, Kirsten, MD  Active   nitroGLYCERIN  (NITROSTAT ) 0.4 MG SL tablet [id number]  DISSOLVE ONE TABLET UNDER THE TONGUE EVERY 5 MINUTES AS NEEDED FOR CHEST PAIN.  DO NOT EXCEED A TOTAL OF 3 DOSES IN 15 MINUTES [name], Kirsten, MD  Active   OXYGEN  [id number]  Inhale 3 L into the lungs as needed (shortness of breath). [provider]  Active Self  pantoprazole  (PROTONIX )  40 MG tablet [id number]  Take 1 tablet by mouth once daily [name], Kirsten, MD  Active   tamsulosin  (FLOMAX ) 0.4 MG CAPS capsule [id number]  Take 1 capsule by mouth once daily Sherre Clapper, MD  Active   TRELEGY ELLIPTA  200-62.5-25 MCG/ACT AEPB [id number]  INHALE 2 PUFFS ONCE DAILY CoxClapper, MD  Active             Recommendation:   Continue Current Plan of Care  Follow Up Plan:   Telephone follow up appointment date/time:  09/11/23 at 1:00  SIG  Olam Idol BSN RN CCM [place]  [facility], [facility] RN Care Manager Direct Dial: [phone] Fax: [phone]

## 2023-08-08 NOTE — Patient Instructions (Signed)
 Visit Information  Thank you for taking time to visit with me today. Please don't hesitate to contact me if I can be of assistance to you before our next scheduled appointment.  Our next appointment is by telephone on 09/11/23 at 1:00 with Luke Griffiths [facility] Please call the care guide team at [phone] if you need to cancel or reschedule your appointment.   Following is a copy of your care plan:   Goals Addressed             This Visit's Progress    VBCI RN Care Plan   Improving    Problems:  Chronic Disease Management support and education needs related to CHF, COPD, and DMII  Goal: Over the next 30 days the Patient will attend all scheduled medical appointments:  09/09/23 CVD and 09/11/23 PCP  as evidenced by chart review and patient reporting        continue to work with RN Care Manager and/or Social Worker to address care management and care coordination needs related to CHF, COPD, and DMII as evidenced by adherence to care management team scheduled appointments     demonstrate a decrease CHF, COPD, and DMII in exacerbations as evidenced by no hospital readmissions demonstrate Ongoing adherence to prescribed treatment plan for CHF, COPD, and DMII as evidenced by taking all medications as prescribed, attending all medical appointments, lifestyle and diet changes, considering smoking cessation take all medications exactly as prescribed and will call provider for medication related questions as evidenced by chart review and patient report    work with pharmacist to address medication adherence and lack of education about medication instructions related to CHF, COPD, and DMII as evidenced by review of electronic medical record and patient or pharmacist report     Interventions:   Heart Failure Interventions: Basic overview and discussion of pathophysiology of Heart Failure reviewed Provided education on low sodium diet Assessed need for readable accurate scales in home Provided  education about placing scale on hard, flat surface Advised patient to weigh each morning after emptying bladder Discussed importance of daily weight and advised patient to weigh and record daily Reviewed role of diuretics in prevention of fluid overload and management of heart failure; Discussed the importance of keeping all appointments with provider Assisted patient to reschedule cancelled cardiology appointment for 09/09/23.   Message sent to provider regarding 18 lb weight gain, increased edema in LE, BP of 153/63  COPD Interventions: Advised patient to track and manage COPD triggers Provided instruction about proper use of medications used for management of COPD including inhalers Provided education about and advised patient to utilize infection prevention strategies to reduce risk of respiratory infection Discussed the importance of adequate rest and management of fatigue with COPD Screening for signs and symptoms of depression related to chronic disease state       Diabetes Interventions: Assessed patient's understanding of A1c goal: <6.5% Reviewed medications with patient and discussed importance of medication adherence Counseled on importance of regular laboratory monitoring as prescribed Discussed plans with patient for ongoing care management follow up and provided patient with direct contact information for care management team Referral made to pharmacy team for assistance with polypharmacy Review of patient status, including review of consultants reports, relevant laboratory and other test results, and medications completed Assessed social determinant of health barriers Lab Results  Component Value Date   HGBA1C 6.0 (H) 06/21/2023    Patient Self-Care Activities:  Attend all scheduled provider appointments Attend church or other social activities  Call pharmacy for medication refills 3-7 days in advance of running out of medications Call provider office for new concerns  or questions  Perform all self care activities independently  Take medications as prescribed   Work with the pharmacist to address medication management needs and will continue to work with the clinical team to address health care and disease management related needs call office if I gain more than 2 pounds in one day or 5 pounds in one week keep legs up while sitting track weight in diary use salt in moderation watch for swelling in feet, ankles and legs every day weigh myself daily begin a heart failure diary bring diary to all appointments eliminate smoking in my home eliminate symptom triggers at home follow rescue plan if symptoms flare-up keep follow-up appointments: needs appointment get at least 7 to 8 hours of sleep at night use devices that will help like a cane, sock-puller or reacher Schedule followup visit with pulmonology  Plan:  Telephone follow up appointment with care management team member scheduled for:    09/11/23 at 1:00             Please call the Suicide and Crisis Lifeline: 988 call the [place]  National Suicide Prevention Lifeline: 715 406 1691 or TTY: 704-863-1034 TTY 480-810-8608) to talk to a trained counselor call 1-800-273-TALK (toll free, 24 hour hotline) if you are experiencing a Mental Health or Behavioral Health Crisis or need someone to talk to.  Patient verbalizes understanding of instructions and care plan provided today and agrees to view in MyChart. Active MyChart status and patient understanding of how to access instructions and care plan via MyChart confirmed with patient.     SIGNATURE  Olam Idol BSN RN CCM [place]  [facility], [facility] RN Care Manager Direct Dial: [phone] Fax: [phone]

## 2023-08-09 ENCOUNTER — Other Ambulatory Visit: Payer: Self-pay | Admitting: Family Medicine

## 2023-08-10 ENCOUNTER — Other Ambulatory Visit: Payer: Self-pay | Admitting: Family Medicine

## 2023-08-12 ENCOUNTER — Telehealth: Payer: Self-pay

## 2023-08-12 NOTE — Telephone Encounter (Signed)
 Copied from CRM #[id number]. Topic: Clinical - Medication Prior Auth >> Aug 12, 2023 11:48 AM Donna BRAVO wrote: Reason for RMF:[id number]  with Cover [employer] Med[employer] [phone] asking if  Finerenone  (KERENDIA ) 20 MG TABS status and has it been approved   Christobal would like call back regarding is [place]

## 2023-08-25 ENCOUNTER — Ambulatory Visit: Admitting: Cardiology

## 2023-08-31 ENCOUNTER — Other Ambulatory Visit: Payer: Self-pay | Admitting: Family Medicine

## 2023-09-04 ENCOUNTER — Other Ambulatory Visit: Payer: Self-pay | Admitting: Family Medicine

## 2023-09-04 DIAGNOSIS — J449 Chronic obstructive pulmonary disease, unspecified: Secondary | ICD-10-CM

## 2023-09-06 ENCOUNTER — Telehealth: Payer: Self-pay

## 2023-09-06 NOTE — Telephone Encounter (Signed)
 Copied from CRM #[id number]. Topic: Clinical - Medication Prior Auth >> Sep 05, 2023  3:40 PM Willma R wrote: Reason for CRM: Christobal with Cover Meds is calling to inquire since the prior auth for Finerenone  (KERENDIA ) 20 MG TABS was denied, wants to know if the office is filing an appear or if they should close the request.   Christobal can be reached at [phone]

## 2023-09-07 ENCOUNTER — Other Ambulatory Visit: Payer: Self-pay | Admitting: Family Medicine

## 2023-09-07 ENCOUNTER — Ambulatory Visit (INDEPENDENT_AMBULATORY_CARE_PROVIDER_SITE_OTHER): Admitting: Family Medicine

## 2023-09-07 VITALS — BP 132/70 | HR 88 | Temp 98.0°F | Ht 69.0 in | Wt 235.0 lb

## 2023-09-07 DIAGNOSIS — J449 Chronic obstructive pulmonary disease, unspecified: Secondary | ICD-10-CM

## 2023-09-07 DIAGNOSIS — R600 Localized edema: Secondary | ICD-10-CM | POA: Diagnosis not present

## 2023-09-07 DIAGNOSIS — E1142 Type 2 diabetes mellitus with diabetic polyneuropathy: Secondary | ICD-10-CM

## 2023-09-07 MED ORDER — FUROSEMIDE 40 MG PO TABS
40.0000 mg | ORAL_TABLET | Freq: Two times a day (BID) | ORAL | 3 refills | Status: DC
Start: 2023-09-07 — End: 2023-10-14

## 2023-09-07 NOTE — Patient Instructions (Signed)
INCREASE LASIX TO 40 MG TWICE A DAY

## 2023-09-07 NOTE — Progress Notes (Unsigned)
 Subjective:  Patient ID: Kyle Farley, male    DOB: 1940-09-21  Age: 83 y.o. MRN: [id number]  Chief Complaint  Patient presents with   Medical Management of Chronic Issues    Weight Management    HPI: Patient presents today to discuss his weight. States he has concerns about his lower legs swelling. Currently takes lasix  40 mg daily.  Discussed the use of AI scribe software for clinical note transcription with the patient, who gave verbal consent to proceed.  History of Present Illness   Kyle Farley is an 83 year old male who presents with concerns about weight management and leg swelling.  Weight gain and edema - Weight increased from 220 to 235 pounds with fluctuations attributed to variable eating habits - Intermittent swelling in legs and feet - Currently taking Lasix  daily  Dyspnea and fatigue - Persistent shortness of breath, worsening over the past week - Sleeps in a recliner due to symptoms - No cough or chest pain - Fatigue and lack of energy - Uses new inhaler more frequently than prescribed, especially during physical activities  Peripheral neuropathy - Numbness and burning in feet - Qutenza  patches applied every three months without significant improvement - Feet described as 'lumpy' - Burning sensation with patch application  Recent fall and musculoskeletal discomfort - Recent fall in the yard - Side discomfort following the fall - No serious injury reported  Dermatologic concerns - Sun damage on skin attributed to infrequent sunscreen use          06/21/2023   10:19 AM 10/20/2022    9:17 AM 08/05/2022   11:08 AM 05/27/2022    4:12 PM 04/02/2022   11:24 AM  Depression screen PHQ 2/9  Decreased Interest 0 0 0 0 0  Down, Depressed, Hopeless 0 0 0 0 0  PHQ - 2 Score 0 0 0 0 0  Altered sleeping   0 0   Tired, decreased energy   0 2   Change in appetite   0 2   Feeling bad or failure about yourself    0 0   Trouble concentrating   0 0   Moving  slowly or fidgety/restless   0 3   Suicidal thoughts   0 0   PHQ-9 Score   0 7   Difficult doing work/chores   Not difficult at all Somewhat difficult         08/08/2023   12:37 PM  Fall Risk   Falls in the past year? 0    Patient Care Team: Sherre Clapper, MD as PCP - General (Family Medicine) Monetta Redell PARAS, MD as PCP - Cardiology (Cardiology) Inocencio Soyla Lunger, MD as Consulting Physician (Cardiology) Monetta Redell PARAS, MD as Consulting Physician (Cardiology) Ramonita Suzen LITTIE, RN as [facility] Care Management Carlin Delon BROCKS, NP as Nurse Practitioner (Cardiology)   Review of Systems  Constitutional:  Negative for chills, diaphoresis, fatigue and fever.  HENT:  Negative for congestion, ear pain and sore throat.   Respiratory:  Positive for shortness of breath. Negative for cough.   Cardiovascular:  Positive for leg swelling. Negative for chest pain.  Gastrointestinal:  Negative for abdominal pain, constipation, diarrhea, nausea and vomiting.  Genitourinary:  Negative for dysuria and urgency.  Musculoskeletal:  Negative for arthralgias and myalgias.  Neurological:  Negative for dizziness and headaches.  Psychiatric/Behavioral:  Negative for dysphoric mood.     Current Outpatient Medications on File Prior to Visit  Medication Sig Dispense Refill  Albuterol -Budesonide  (AIRSUPRA ) 90-80 MCG/ACT AERO Inhale 2 puffs into the lungs every 4 (four) hours as needed. 10.7 g 5   amiodarone  (PACERONE ) 200 MG tablet Take 1 tablet (200 mg total) by mouth daily. (Patient taking differently: Take 200 mg by mouth 2 (two) times daily.)     amLODipine  (NORVASC ) 5 MG tablet Take 1 tablet by mouth once daily 90 tablet 0   apixaban  (ELIQUIS ) 5 MG TABS tablet Take 1 tablet (5 mg total) by mouth 2 (two) times daily. 180 tablet 3   atorvastatin  (LIPITOR) 40 MG tablet Take 1 tablet by mouth once daily 90 tablet 1   Docusate Sodium  (STOOL SOFTENER LAXATIVE PO) Take 1 tablet by mouth daily.     ferrous  sulfate 325 (65 FE) MG EC tablet Take 1 tablet (325 mg total) by mouth daily. 30 tablet 3   Finerenone  (KERENDIA ) 20 MG TABS Take 1 tablet (20 mg total) by mouth daily. 90 tablet 0   JARDIANCE  25 MG TABS tablet TAKE 1 TABLET BY MOUTH ONCE DAILY BEFORE BREAKFAST 90 tablet 0   levothyroxine  (SYNTHROID ) 75 MCG tablet Take 1 tablet (75 mcg total) by mouth daily. 90 tablet 3   metoprolol  succinate (TOPROL -XL) 50 MG 24 hr tablet TAKE 1 TABLET BY MOUTH ONCE DAILY -  TAKE  WITH  OR  IMMEDIATELY  FOLLOWING  A  MEAL 90 tablet 1   montelukast  (SINGULAIR ) 10 MG tablet TAKE 1 TABLET BY MOUTH ONCE DAILY AT  12  NOON 90 tablet 0   OXYGEN  Inhale 3 L into the lungs as needed (shortness of breath).     pantoprazole  (PROTONIX ) 40 MG tablet Take 1 tablet by mouth once daily 90 tablet 1   tamsulosin  (FLOMAX ) 0.4 MG CAPS capsule Take 1 capsule by mouth once daily 90 capsule 0   No current facility-administered medications on file prior to visit.   Past Medical History:  Diagnosis Date   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 (HCC) 11/10/2021   Acquired hypothyroidism 11/10/2021   Acute hypoxic respiratory failure (HCC) 04/25/2022   Acute on chronic diastolic heart failure (HCC) 04/25/2022   Acute on chronic respiratory failure with hypoxia (HCC) 04/25/2022   Acute on chronic systolic heart failure (HCC) 11/10/2021   AKI (acute kidney injury) (HCC) 06/08/2022   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/10/2021   Alcohol use 09/12/2020   Anemia of chronic disease 04/25/2022   Arthritis    Atherosclerosis of native arteries of extremities with intermittent claudication, bilateral legs (HCC) 11/10/2021   Bilateral leg edema 02/16/2022   BPH (benign prostatic hyperplasia) 11/10/2021   CHF (congestive heart failure) (HCC)    Chronic diastolic CHF (congestive heart failure) (HCC) 04/25/2022   Chronic hyponatremia 04/25/2022   Chronic respiratory failure with hypoxia (HCC) 05/09/2022   Cigarette  smoker 10/21/2014   Class 1 obesity 04/25/2022   Cognitive communication deficit 03/05/2021   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)    Diabetic polyneuropathy associated with type 2 diabetes mellitus (HCC) 08/28/2022   Dyspnea    Dysrhythmia    atrial fibrillation   Elevated glucose 05/27/2022   Encounter for prostate cancer screening 02/16/2022   Encounter for screening for lung cancer 02/16/2022   Essential hypertension 10/21/2014   Femur fracture, right (HCC) 09/12/2020   GERD (gastroesophageal reflux disease)    History of kidney stones    Hyperlipidemia 01/24/2017   Hypertension    Hypo-osmolality and hyponatremia 03/05/2021   Hyponatremia 04/26/2022   Hypotension 08/05/2022  Kidney stones 01/24/2017   Laceration of right eyebrow 06/20/2022   Medication monitoring encounter 12/18/2022   Moderate aortic stenosis 2024   Muscle wasting and atrophy, not elsewhere classified, multiple sites 03/05/2021   Nicotine  dependence, cigarettes, uncomplicated 03/05/2021   Non-seasonal allergic rhinitis due to pollen 06/06/2022   Other abnormalities of gait and mobility 03/05/2021   Other chest pain 12/18/2022   Other lack of coordination 03/05/2021   Oxygen  dependent 08/05/2022   Paroxysmal atrial fibrillation (HCC) 10/21/2014   Pressure injury of skin 09/16/2020   Right ureteral stone 06/08/2022   Sepsis secondary to UTI (HCC) 06/08/2022   SOB (shortness of breath) 05/10/2022   Syncope and collapse 08/05/2022   Tobacco abuse 02/16/2022   Tobacco use disorder 02/16/2022   Type 2 diabetes mellitus (HCC) 06/08/2022   Visit for suture removal 06/15/2022   Past Surgical History:  Procedure Laterality Date   LEFT HEART CATH AND CORONARY ANGIOGRAPHY N/A 09/15/2020   Procedure: LEFT HEART CATH AND CORONARY ANGIOGRAPHY;  Surgeon: Wonda Sharper, MD;  Location: [facility] INVASIVE CV LAB;  Service: Cardiovascular;  Laterality: N/A;   LEG SURGERY Right    steal pin placed in right leg  35 years ago   ORIF FEMUR FRACTURE Right 09/15/2020   Procedure: OPEN REDUCTION INTERNAL FIXATION FEMORAL SHAFT FRACTURE;  Surgeon: Kendal Franky SQUIBB, MD;  Location: [facility] OR;  Service: Orthopedics;  Laterality: Right;    Family History  Problem Relation Age of Onset   Cancer Mother    Diabetes Mother    Breast cancer Sister    Social History   Socioeconomic History   Marital status: Widowed    Spouse name: Not on file   Number of children: 3   Years of education: Not on file   Highest education level: High school graduate  Occupational History   Occupation: retired  Tobacco Use   Smoking status: Every Day    Current packs/day: 1.50    Average packs/day: 1.5 packs/day for 62.0 years (93.0 ttl pk-yrs)    Types: Cigarettes, Cigars   Smokeless tobacco: Never  Vaping Use   Vaping status: Never Used  Substance and Sexual Activity   Alcohol use: Yes    Alcohol/week: 5.0 standard drinks of alcohol    Types: 5 Standard drinks or equivalent per week    Comment: 4-5 beers per day   Drug use: No   Sexual activity: Not Currently  Other Topics Concern   Not on file  Social History Narrative   Not on file   Social Drivers of Health   Financial Resource Strain: Low Risk  (06/21/2023)   Overall Financial Resource Strain (CARDIA)    Difficulty of Paying Living Expenses: Not hard at all  Food Insecurity: No Food Insecurity (07/27/2023)   Hunger Vital Sign    Worried About Running Out of Food in the Last Year: Never true    Ran Out of Food in the Last Year: Never true  Transportation Needs: No Transportation Needs (08/08/2023)   PRAPARE - Administrator, Civil Service (Medical): No    Lack of Transportation (Non-Medical): No  Physical Activity: Inactive (06/21/2023)   Exercise Vital Sign    Days of Exercise per Week: 0 days    Minutes of Exercise per Session: 0 min  Stress: No Stress Concern Present (06/21/2023)   [employer] of Occupational Health - Occupational Stress  Questionnaire    Feeling of Stress : Not at all  Social Connections: Moderately Isolated (06/21/2023)  Social Connection and Isolation Panel    Frequency of Communication with Friends and Family: More than three times a week    Frequency of Social Gatherings with Friends and Family: More than three times a week    Attends Religious Services: More than 4 times per year    Active Member of [employer] or Organizations: No    Attends Banker Meetings: Never    Marital Status: Widowed    Objective:  BP 132/70   Pulse 88   Temp 98 F (36.7 C)   Ht 5' 9 (1.753 m)   Wt 235 lb (106.6 kg)   SpO2 (!) 85% Comment: 3L O2  BMI 34.70 kg/m      09/09/2023    9:06 AM 09/07/2023    3:15 PM 07/27/2023    9:38 AM  BP/Weight  Systolic BP 140 132 153  Diastolic BP 70 70 63  Wt. (Lbs) 236.8 235 228  BMI 37.09 kg/m2 34.7 kg/m2 33.67 kg/m2    Physical Exam Vitals reviewed.  Constitutional:      Appearance: Normal appearance.  Neck:     Vascular: No carotid bruit.  Cardiovascular:     Rate and Rhythm: Normal rate and regular rhythm.     Pulses: Normal pulses.     Heart sounds: Normal heart sounds.  Pulmonary:     Effort: Pulmonary effort is normal.     Breath sounds: Normal breath sounds. No wheezing, rhonchi or rales.  Abdominal:     General: Bowel sounds are normal.     Palpations: Abdomen is soft.     Tenderness: There is no abdominal tenderness.  Musculoskeletal:     Right lower leg: Edema (1+) present.     Left lower leg: Edema (1+) present.  Neurological:     Mental Status: He is alert.  Psychiatric:        Mood and Affect: Mood normal.        Behavior: Behavior normal.         Lab Results  Component Value Date   WBC 9.5 06/21/2023   HGB 13.3 06/21/2023   HCT 41.4 06/21/2023   PLT 213 06/21/2023   GLUCOSE 89 07/21/2023   CHOL 156 06/21/2023   TRIG 105 06/21/2023   HDL 59 06/21/2023   LDLCALC 78 06/21/2023   ALT 16 07/21/2023   AST 18 07/21/2023   NA 141  07/21/2023   K 4.0 07/21/2023   CL 96 07/21/2023   CREATININE 1.14 07/21/2023   BUN 13 07/21/2023   CO2 27 07/21/2023   TSH 4.390 05/02/2023   HGBA1C 6.0 (H) 06/21/2023      Assessment & Plan:  Bilateral leg edema Assessment & Plan: Increasing furosemide  40 mg  to twice a day.  Orders: -     Furosemide ; Take 1 tablet (40 mg total) by mouth 2 (two) times daily.  Dispense: 60 tablet; Refill: 3  Diabetic polyneuropathy associated with type 2 diabetes mellitus (HCC) Assessment & Plan: Management per specialist. Podiatry.  Continue qutenza  patches.      Meds ordered this encounter  Medications   furosemide  (LASIX ) 40 MG tablet    Sig: Take 1 tablet (40 mg total) by mouth 2 (two) times daily.    Dispense:  60 tablet    Refill:  3    No orders of the defined types were placed in this encounter.    Follow-up: Return in about 4 weeks (around 10/05/2023).  I,Marla I Leal-Borjas,acting as a scribe for  Abigail Free, MD.,have documented all relevant documentation on the behalf of Abigail Free, MD,as directed by  Abigail Free, MD while in the presence of Abigail Free, MD.  An After Visit Summary was printed and given to the patient.  Abigail Free, MD [facility] [phone]

## 2023-09-08 ENCOUNTER — Encounter: Payer: Self-pay | Admitting: Cardiology

## 2023-09-08 NOTE — Progress Notes (Signed)
 " Cardiology Office Note:    Date:  09/09/2023   ID:  Kyle Farley, DOB [birth year], MRN [id number]  PCP:  Sherre Clapper, MD   [facility] Providers Cardiologist:  Redell Leiter, MD Cardiology APP:  Carlin Delon BROCKS, NP     Referring MD: Sherre Clapper, MD     History of Present Illness:    Kyle Farley is a 83 y.o. male with a hx of paroxysmal atrial fibrillation, non-obstructive CAD, hypertension, moderate aortic stenosis, CHF, AAA, PAD, COPD O2, GERD, acquired hypothyroidism, history of alcohol abuse, BPH, hyperlipidemia.  12/30/2022 MPI no new changes to suggest ischemia 08/18/2022 echo EF 55 to 60%, LA dilated, trivial MR, mild aortic stenosis 04/25/2022 echo EF 60-65, grade 2 DD, biatrial enlargement, mild MR, mild calcification of aortic valve with mild to moderate stenosis 09/06/2020 echo 40 to 45%, global hypokinesis, diastolic parameters were indeterminate, left atrium moderately dilated, mild MR. 09/15/2020 left heart cath nonobstructive CAD with mild nonobstructive plaquing in the LAD, left circumflex, moderate calcific stenosis of the RCA ostium.  Kyle Farley is a longstanding patient of Dr. Leiter for the management of atrial fibrillation, heart failure and coronary artery disease. He was admitted on 04/24/2022 to 04/30/2022 for acute on chronic diastolic heart failure, after complaints of progressive shortness of breath, orthopnea and chest pain.  He was bradycardic in the emergency department with his heart rate in the 30s, pedal edema, chest x-ray revealed mild pulmonary vascular congestion.  He was diuresed 12.5 L, creatinine trended up to 1.5, his losartan  was held.  Echo at this time revealed EF 60 to 65%, mild concentric LVH, grade 2 DD, moderate dilatation of right and left atrium, mild MR, mild calcification of the aortic valve, moderate thickening of the aortic valve, mild to moderate aortic valve stenosis.  Since his discharge he has been evaluated by his PCP on  05/05/2022, he had been requiring oxygen  since discharge.  He was evaluated by heart failure TOC on 05/10/2022, said he had not felt well for the previous 2 days, increased fatigue and weakness.  His amiodarone  was stopped at this visit due to junctional rhythm, heart rate 45 bpm.  Low-dose metoprolol  was continued.  Head CT was arranged due to his sudden onset of dizziness however it was negative for any intracranial findings.  Blood work at this time revealed a sodium of 130, albumin  3.2.  Most recently was evaluated by Dr. Leiter on 02/24/2023, he had been started on gabapentin  per podiatry and was notably edematous so it was recommended he stop his gabapentin , he was maintaining sinus rhythm.  He presents today accompanied by his son for follow-up.  He does not have any formal complaints.  He saw his PCP yesterday and his diuretic was increased to twice daily to help with ongoing pedal edema.  He does continue to smoke approximately half pack per day, not interested in cessation.  He is on oxygen  at night and also when he is out of the house.  He sleeps in a recliner at night which is ongoing, he states overall he feels about the same as he always does. He denies chest pain, palpitations, pnd, orthopnea, n, v, dizziness, syncope,  weight gain, or early satiety.    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  claudication, bilateral legs (HCC) 11/10/2021   Bilateral leg edema 02/16/2022   BPH (benign prostatic hyperplasia) 11/10/2021   CHF (congestive heart failure) (HCC)    Chronic diastolic CHF (congestive heart failure) (HCC) 04/25/2022   Chronic hyponatremia 04/25/2022   Chronic respiratory failure with hypoxia (HCC) 05/09/2022   Cigarette smoker 10/21/2014   Class 1 obesity 04/25/2022   Cognitive communication deficit 03/05/2021   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)    Diabetic polyneuropathy associated with type 2 diabetes mellitus (HCC) 08/28/2022   Dyspnea    Dysrhythmia    atrial fibrillation   Elevated glucose 05/27/2022   Encounter for prostate cancer screening 02/16/2022   Encounter for screening for lung cancer 02/16/2022   Essential hypertension 10/21/2014   Femur fracture, right (HCC) 09/12/2020   GERD (gastroesophageal reflux disease)    History of kidney stones    Hyperlipidemia 01/24/2017   Hypertension    Hypo-osmolality and hyponatremia 03/05/2021   Hyponatremia 04/26/2022   Hypotension 08/05/2022   Kidney stones 01/24/2017   Laceration of right eyebrow 06/20/2022   Medication monitoring encounter 12/18/2022   Moderate aortic stenosis 2024   Muscle wasting and atrophy, not elsewhere classified, multiple sites 03/05/2021   Nicotine  dependence, cigarettes, uncomplicated 03/05/2021   Non-seasonal allergic rhinitis due to pollen 06/06/2022   Other abnormalities of gait and mobility 03/05/2021   Other chest pain 12/18/2022   Other lack of coordination 03/05/2021   Oxygen  dependent 08/05/2022   Paroxysmal atrial fibrillation (HCC) 10/21/2014   Pressure injury of skin 09/16/2020   Right ureteral stone 06/08/2022   Sepsis secondary to UTI (HCC) 06/08/2022   SOB (shortness of breath) 05/10/2022   Syncope and collapse 08/05/2022   Tobacco abuse 02/16/2022   Tobacco use disorder 02/16/2022   Type 2 diabetes mellitus (HCC) 06/08/2022   Visit for suture  removal 06/15/2022    Past Surgical History:  Procedure Laterality Date   LEFT HEART CATH AND CORONARY ANGIOGRAPHY N/A 09/15/2020   Procedure: LEFT HEART CATH AND CORONARY ANGIOGRAPHY;  Surgeon: Wonda Sharper, MD;  Location: [facility] INVASIVE CV LAB;  Service: Cardiovascular;  Laterality: N/A;   LEG SURGERY Right    steal pin placed in right leg 35 years ago   ORIF FEMUR FRACTURE Right 09/15/2020   Procedure: OPEN REDUCTION INTERNAL FIXATION FEMORAL SHAFT FRACTURE;  Surgeon: Kendal Franky SQUIBB, MD;  Location: [facility] OR;  Service: Orthopedics;  Laterality: Right;    Current Medications: Current Meds  Medication Sig   albuterol  ([name] ) (2.5 MG/3ML) 0.083% nebulizer solution Take 2.5 mg by nebulization every 6 (six) hours as needed for shortness of breath or wheezing.   Albuterol -Budesonide  (AIRSUPRA ) 90-80 MCG/ACT AERO Inhale 2 puffs into the lungs every 4 (four) hours as needed.   amiodarone  (PACERONE ) 200 MG tablet Take 1 tablet (200 mg total) by mouth daily. (Patient taking differently: Take 200 mg by mouth 2 (two) times daily.)   amLODipine  (NORVASC ) 5 MG tablet Take 1 tablet by mouth once daily   apixaban  (ELIQUIS ) 5 MG TABS tablet Take 1 tablet (5 mg total) by mouth 2 (two) times daily.   atorvastatin  (LIPITOR) 40 MG tablet  Take 1 tablet by mouth once daily   Docusate Sodium  (STOOL SOFTENER LAXATIVE PO) Take 1 tablet by mouth daily.   ferrous sulfate  325 (65 FE) MG EC tablet Take 1 tablet (325 mg total) by mouth daily.   Finerenone  (KERENDIA ) 20 MG TABS Take 1 tablet (20 mg total) by mouth daily.   Fluticasone -Umeclidin-Vilant (TRELEGY ELLIPTA ) 200-62.5-25 MCG/ACT AEPB Inhale 1 puff into the lungs daily.   furosemide  (LASIX ) 40 MG tablet Take 1 tablet (40 mg total) by mouth 2 (two) times daily.   JARDIANCE  25 MG TABS tablet TAKE 1 TABLET BY MOUTH ONCE DAILY BEFORE BREAKFAST   levothyroxine  (SYNTHROID ) 75 MCG tablet Take 1 tablet (75 mcg total) by mouth daily.   metoprolol  succinate  (TOPROL -XL) 50 MG 24 hr tablet TAKE 1 TABLET BY MOUTH ONCE DAILY -  TAKE  WITH  OR  IMMEDIATELY  FOLLOWING  A  MEAL   montelukast  (SINGULAIR ) 10 MG tablet TAKE 1 TABLET BY MOUTH ONCE DAILY AT  12  NOON   nitroGLYCERIN  (NITROSTAT ) 0.4 MG SL tablet DISSOLVE ONE TABLET UNDER THE TONGUE EVERY 5 MINUTES AS NEEDED FOR CHEST PAIN.  DO NOT EXCEED A TOTAL OF 3 DOSES IN 15 MINUTES NOW   OXYGEN  Inhale 3 L into the lungs as needed (shortness of breath).   pantoprazole  (PROTONIX ) 40 MG tablet Take 1 tablet by mouth once daily   tamsulosin  (FLOMAX ) 0.4 MG CAPS capsule Take 1 capsule by mouth once daily     Allergies:   Gabapentin    Social History   Socioeconomic History   Marital status: Widowed    Spouse name: Not on file   Number of children: 3   Years of education: Not on file   Highest education level: High school graduate  Occupational History   Occupation: retired  Tobacco Use   Smoking status: Every Day    Current packs/day: 1.50    Average packs/day: 1.5 packs/day for 62.0 years (93.0 ttl pk-yrs)    Types: Cigarettes, Cigars   Smokeless tobacco: Never  Vaping Use   Vaping status: Never Used  Substance and Sexual Activity   Alcohol use: Yes    Alcohol/week: 5.0 standard drinks of alcohol    Types: 5 Standard drinks or equivalent per week    Comment: 4-5 beers per day   Drug use: No   Sexual activity: Not Currently  Other Topics Concern   Not on file  Social History Narrative   Not on file   Social Drivers of Health   Financial Resource Strain: Low Risk  (06/21/2023)   Overall Financial Resource Strain (CARDIA)    Difficulty of Paying Living Expenses: Not hard at all  Food Insecurity: No Food Insecurity (07/27/2023)   Hunger Vital Sign    Worried About Running Out of Food in the Last Year: Never true    Ran Out of Food in the Last Year: Never true  Transportation Needs: No Transportation Needs (08/08/2023)   PRAPARE - Administrator, Civil Service (Medical): No     Lack of Transportation (Non-Medical): No  Physical Activity: Inactive (06/21/2023)   Exercise Vital Sign    Days of Exercise per Week: 0 days    Minutes of Exercise per Session: 0 min  Stress: No Stress Concern Present (06/21/2023)   [employer] of Occupational Health - Occupational Stress Questionnaire    Feeling of Stress : Not at all  Social Connections: Moderately Isolated (06/21/2023)   Social Connection and Isolation Panel  Frequency of Communication with Friends and Family: More than three times a week    Frequency of Social Gatherings with Friends and Family: More than three times a week    Attends Religious Services: More than 4 times per year    Active Member of [employer] or Organizations: No    Attends Banker Meetings: Never    Marital Status: Widowed     Family History: The patient's family history includes Breast cancer in his sister; Cancer in his mother; Diabetes in his mother.  ROS:   Please see the history of present illness.     All other systems reviewed and are negative.  EKGs/Labs/Other Studies Reviewed:    The following studies were reviewed today: Cardiac Studies & Procedures   ______________________________________________________________________________________________ CARDIAC CATHETERIZATION  CARDIAC CATHETERIZATION 09/15/2020  Conclusion   Ost RCA to Prox RCA lesion is 50% stenosed.   Prox LAD lesion is 30% stenosed with 0% stenosed side branch in 1st Diag.   Prox Cx to Mid Cx lesion is 40% stenosed.  1.  Nonobstructive coronary artery disease with mild nonobstructive plaquing in the LAD, left circumflex, and moderate calcific stenosis of the RCA ostium. 2.  Low normal LVEDP  Recommend: No high-grade coronary stenoses identified.  Patient with femur fracture.  He should not be at excessive risk of ischemic cardiac complications.  Findings Coronary Findings Diagnostic  Dominance: Right  Left Main The vessel exhibits minimal luminal  irregularities. The left main is patent with no obstructive disease.  The left main trifurcates into the LAD, left circumflex, and a small intermediate branch.  Left Anterior Descending Prox LAD lesion is 30% stenosed with 0% stenosed side branch in 1st Diag. Mild plaquing noted at the first septal perforator with hypodensity and approximately 30% stenosis  Left Circumflex Prox Cx to Mid Cx lesion is 40% stenosed.  Right Coronary Artery There is mild diffuse disease throughout the vessel. Ost RCA to Prox RCA lesion is 50% stenosed. The lesion is calcified. Heavy calcification of the right coronary cusp with moderate nonobstructive plaquing of 50%.  There is good contrast reflux and no pressure dampening with the catheter engaged in the artery.  Intervention  No interventions have been documented.   STRESS TESTS  MYOCARDIAL PERFUSION IMAGING 12/30/2022   ECHOCARDIOGRAM  ECHOCARDIOGRAM COMPLETE 04/25/2022  Narrative ECHOCARDIOGRAM REPORT    Patient Name:   Kyle Farley Date of Exam: 04/25/2022 Medical Rec #:  [id number]       Height:       69.0 in Accession #:    [id number]      Weight:       222.0 lb Date of Birth:  1940/01/29        BSA:          2.160 m Patient Age:    81 years        BP:           124/93 mmHg Patient Gender: M               HR:           56 bpm. Exam Location:  Inpatient  Procedure: 2D Echo, Color Doppler, Cardiac Doppler and Intracardiac Opacification Agent  Indications:     I50.31 Acute diastolic (congestive) heart failure  History:         Patient has prior history of Echocardiogram examinations, most recent 06/19/2021. CHF, COPD, Arrythmias:Atrial Fibrillation; Risk Factors:Hypertension and Dyslipidemia.  Sonographer:     Damien Senior  RDCS Referring Phys:  JUSTIN B HOWERTER Diagnosing Phys: Jerel Balding MD   Sonographer Comments: Technically difficult due to lung interference. IMPRESSIONS   1. Left ventricular ejection fraction, by  estimation, is 60 to 65%. The left ventricle has normal function. The left ventricle has no regional wall motion abnormalities. There is mild concentric left ventricular hypertrophy. Left ventricular diastolic parameters are consistent with Grade II diastolic dysfunction (pseudonormalization). 2. Right ventricular systolic function is low normal. The right ventricular size is moderately enlarged. 3. Left atrial size was moderately dilated. 4. Right atrial size was moderately dilated. 5. The mitral valve is degenerative. Mild mitral valve regurgitation. 6. The aortic valve is calcified. There is mild calcification of the aortic valve. There is moderate thickening of the aortic valve. Aortic valve regurgitation is trivial. Mild to moderate aortic valve stenosis. Aortic valve mean gradient measures 17.0 mmHg. Aortic valve Vmax measures 2.82 m/s. 7. The inferior vena cava is normal in size with greater than 50% respiratory variability, suggesting right atrial pressure of 3 mmHg.  Comparison(s): Prior images reviewed side by side. The left ventricular function has improved. Aortic stenosis is now present.  FINDINGS Left Ventricle: Left ventricular ejection fraction, by estimation, is 60 to 65%. The left ventricle has normal function. The left ventricle has no regional wall motion abnormalities. Definity  contrast agent was given IV to delineate the left ventricular endocardial borders. The left ventricular internal cavity size was normal in size. There is mild concentric left ventricular hypertrophy. Left ventricular diastolic parameters are consistent with Grade II diastolic dysfunction (pseudonormalization).  Right Ventricle: The right ventricular size is moderately enlarged. Right vetricular wall thickness was not well visualized. Right ventricular systolic function is low normal.  Left Atrium: Left atrial size was moderately dilated.  Right Atrium: Right atrial size was moderately  dilated.  Pericardium: There is no evidence of pericardial effusion. Presence of epicardial fat layer.  Mitral Valve: The mitral valve is degenerative in appearance. Mild mitral annular calcification. Mild mitral valve regurgitation.  Tricuspid Valve: The tricuspid valve is grossly normal. Tricuspid valve regurgitation is trivial.  Aortic Valve: The aortic valve is calcified. There is mild calcification of the aortic valve. There is moderate thickening of the aortic valve. Aortic valve regurgitation is trivial. Mild to moderate aortic stenosis is present. Aortic valve mean gradient measures 17.0 mmHg. Aortic valve peak gradient measures 31.8 mmHg. Aortic valve area, by VTI measures 1.53 cm.  Pulmonic Valve: The pulmonic valve was grossly normal. Pulmonic valve regurgitation is not visualized.  Aorta: The aortic root and ascending aorta are structurally normal, with no evidence of dilitation.  Venous: The inferior vena cava is normal in size with greater than 50% respiratory variability, suggesting right atrial pressure of 3 mmHg.  IAS/Shunts: No atrial level shunt detected by color flow Doppler.   LEFT VENTRICLE PLAX 2D LVIDd:         4.00 cm   Diastology LVIDs:         2.60 cm   LV e' medial:    6.53 cm/s LV PW:         1.30 cm   LV E/e' medial:  12.5 LV IVS:        1.10 cm   LV e' lateral:   7.83 cm/s LVOT diam:     2.30 cm   LV E/e' lateral: 10.4 LV SV:         107 LV SV Index:   50 LVOT Area:     4.15 cm  RIGHT VENTRICLE RV S prime:     11.10 cm/s TAPSE (M-mode): 2.3 cm  LEFT ATRIUM             Index        RIGHT ATRIUM           Index LA diam:        3.80 cm 1.76 cm/m   RA Area:     28.00 cm LA Vol (A2C):   74.9 ml 34.68 ml/m  RA Volume:   94.80 ml  43.89 ml/m LA Vol (A4C):   88.9 ml 41.16 ml/m LA Biplane Vol: 92.3 ml 42.74 ml/m AORTIC VALVE AV Area (Vmax):    1.89 cm AV Area (Vmean):   1.63 cm AV Area (VTI):     1.53 cm AV Vmax:           282.00 cm/s AV  Vmean:          195.000 cm/s AV VTI:            0.699 m AV Peak Grad:      31.8 mmHg AV Mean Grad:      17.0 mmHg LVOT Vmax:         128.00 cm/s LVOT Vmean:        76.600 cm/s LVOT VTI:          0.258 m LVOT/AV VTI ratio: 0.37  AORTA Ao Root diam: 3.30 cm Ao Asc diam:  3.30 cm  MITRAL VALVE MV Area (PHT): 3.21 cm    SHUNTS MV Decel Time: 236 msec    Systemic VTI:  0.26 m MV E velocity: 81.70 cm/s  Systemic Diam: 2.30 cm MV A velocity: 84.90 cm/s MV E/A ratio:  0.96  Mihai Croitoru MD Electronically signed by Jerel Balding MD Signature Date/Time: 04/25/2022/3:27:20 PM    Final (Updated)          ______________________________________________________________________________________________       EKG:  EKG is  ordered today.  The ekg ordered today demonstrates sinus bradycardia, HR 59 bpm, RBBB, LAD, consistent with prior EKG tracings.   Recent Labs: 05/02/2023: TSH 4.390 06/21/2023: Hemoglobin 13.3; Platelets 213 07/21/2023: ALT 16; BUN 13; Creatinine, Ser 1.14; Potassium 4.0; Sodium 141  Recent Lipid Panel    Component Value Date/Time   CHOL 156 06/21/2023 1054   TRIG 105 06/21/2023 1054   HDL 59 06/21/2023 1054   CHOLHDL 2.6 06/21/2023 1054   LDLCALC 78 06/21/2023 1054     Risk Assessment/Calculations:    CHA2DS2-VASc Score =     This indicates a  % annual risk of stroke. The patient's score is based upon:                Physical Exam:    VS:  BP (!) 140/70   Pulse (!) 50   Ht 5' 7 (1.702 m)   Wt 236 lb 12.8 oz (107.4 kg)   SpO2 96%   BMI 37.09 kg/m     Wt Readings from Last 3 Encounters:  09/09/23 236 lb 12.8 oz (107.4 kg)  09/07/23 235 lb (106.6 kg)  07/27/23 228 lb (103.4 kg)     GEN: ill appearing, no acute distress, ETOH noted on breath HEENT: Normal NECK: No JVD; No carotid bruits LYMPHATICS: No lymphadenopathy CARDIAC: RRR, no murmurs, rubs, gallops RESPIRATORY:  clear with slight wheezing ABDOMEN: Soft, non-tender,  non-distended MUSCULOSKELETAL:  Becka Lagasse appearing legs; No deformity  SKIN: Warm and dry NEUROLOGIC:  Alert and oriented x 3 PSYCHIATRIC:  Normal  affect   ASSESSMENT:    1. Chronic heart failure with preserved ejection fraction (HCC)   2. Mild CAD   3. Paroxysmal atrial fibrillation (HCC)   4. Hypercoagulable state (HCC)   5. Coronary artery disease involving native coronary artery of native heart without angina pectoris   6. Mixed hyperlipidemia   7. Chronic obstructive pulmonary disease, unspecified COPD type (HCC)      PLAN:    In order of problems listed above:  HFpEF - NYHA class II-III, lungs are clear, legs are edematous.  Continue Jardiance  currently on 25 mg daily, Lasix  40 mg twice daily--recent dose adjustment by his PCP yesterday-and he will see her back in 2 weeks for repeat labs, metoprolol  50 mg daily, continue Kerendia  20 mg daily.  CAD-LHC in 2022 revealed nonobstructive CAD with mild nonobstructive plaquing in the LAD, LCx, moderate calcification of the RCA. Stable with no anginal symptoms. No indication for ischemic evaluation.  Continue Lipitor 40 mg daily, continue Toprol  12.5 mg daily, continue Eliquis .  PAF-CHA2DS2-VASc score 5, continue Eliquis  5 mg twice daily--no indication for dose reduction, continue--most recent creatinine 1.14, hemoglobin 13.3 and hematocrit 41.4, continue Toprol  50 mg daily. Denies hematochezia, hematuria, hemoptysis.   Dyslipidemia-most recent LDL was 78 continue Lipitor 40 mg daily.  Tobacco abuse/COPD- smokes 1.5 PPD, not interested in stopping. He does not smoke while wearing his oxygen  HS, he is adamant that he is careful. Total cessation encouraged.  Follows with [place] [facility]  Disposition -return in 6 months.       Medication Adjustments/Labs and Tests Ordered: Current medicines are reviewed at length with the patient today.  Concerns regarding medicines are outlined above.  Orders Placed This Encounter  Procedures    EKG 12-Lead   No orders of the defined types were placed in this encounter.   Patient Instructions  Medication Instructions:  Your physician recommends that you continue on your current medications as directed. Please refer to the Current Medication list given to you today.  *If you need a refill on your cardiac medications before your next appointment, please call your pharmacy*   Lab Work: None ordered If you have labs (blood work) drawn today and your tests are completely normal, you will receive your results only by: MyChart Message (if you have MyChart) OR A paper copy in the mail If you have any lab test that is abnormal or we need to change your treatment, we will call you to review the results.   Testing/Procedures: None ordered   Follow-Up: At [facility], you and your health needs are our priority.  As part of our continuing mission to provide you with exceptional heart care, we have created designated Provider Care Teams.  These Care Teams include your primary Cardiologist (physician) and Advanced Practice Providers (APPs -  Physician Assistants and Nurse Practitioners) who all work together to provide you with the care you need, when you need it.  We recommend signing up for the patient portal called MyChart.  Sign up information is provided on this After Visit Summary.  MyChart is used to connect with patients for Virtual Visits (Telemedicine).  Patients are able to view lab/test results, encounter notes, upcoming appointments, etc.  Non-urgent messages can be sent to your provider as well.   To learn more about what you can do with MyChart, go to [email or web address].    Your next appointment:   6 month(s)  The format for your next appointment:   In Person  Provider:  Redell Leiter, MD    Other Instructions none  Important Information About Sugar        Signed, Delon JAYSON Hoover, NP  09/09/2023 9:50 AM    [facility] "

## 2023-09-09 ENCOUNTER — Ambulatory Visit: Attending: Cardiology | Admitting: Cardiology

## 2023-09-09 ENCOUNTER — Encounter: Payer: Self-pay | Admitting: Cardiology

## 2023-09-09 VITALS — BP 140/70 | HR 50 | Ht 67.0 in | Wt 236.8 lb

## 2023-09-09 DIAGNOSIS — I5032 Chronic diastolic (congestive) heart failure: Secondary | ICD-10-CM

## 2023-09-09 DIAGNOSIS — I2510 Atherosclerotic heart disease of native coronary artery without angina pectoris: Secondary | ICD-10-CM | POA: Diagnosis not present

## 2023-09-09 DIAGNOSIS — I480 Paroxysmal atrial fibrillation: Secondary | ICD-10-CM

## 2023-09-09 DIAGNOSIS — D6859 Other primary thrombophilia: Secondary | ICD-10-CM | POA: Diagnosis not present

## 2023-09-09 DIAGNOSIS — E782 Mixed hyperlipidemia: Secondary | ICD-10-CM | POA: Diagnosis not present

## 2023-09-09 DIAGNOSIS — J449 Chronic obstructive pulmonary disease, unspecified: Secondary | ICD-10-CM

## 2023-09-09 NOTE — Patient Instructions (Signed)

## 2023-09-10 ENCOUNTER — Other Ambulatory Visit: Payer: Self-pay | Admitting: Family Medicine

## 2023-09-10 NOTE — Assessment & Plan Note (Signed)
 Increasing furosemide  40 mg  to twice a day. Follow up in 4 weeks.

## 2023-09-12 ENCOUNTER — Other Ambulatory Visit: Payer: Self-pay | Admitting: *Deleted

## 2023-09-12 NOTE — Assessment & Plan Note (Signed)
 Management per specialist. Podiatry.  Continue qutenza  patches.

## 2023-09-12 NOTE — Patient Outreach (Signed)
 Complex Care Management   Visit Note  10/27/2023  Name:  Kyle Farley MRN: [id number] DOB: Jun 14, 1940  Situation: Referral received for Complex Care Management related to Heart Failure I obtained verbal consent from Patient.  Visit completed with Patient  on the phone  Airsupra   Dry cough last night Postnasal sleep in recliner has a lift  Nose and eyes run Ask BP humidifier  Diabetes 6.0  HTA new BP cuff & Humidifer   Background:   Past Medical History:  Diagnosis Date   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 11/10/2021   Acquired hypothyroidism 11/10/2021   Acute hypoxic respiratory failure (HCC) 04/25/2022   Acute on chronic diastolic heart failure (HCC) 04/25/2022   Acute on chronic respiratory failure with hypoxia (HCC) 04/25/2022   Acute on chronic systolic heart failure (HCC) 11/10/2021   AKI (acute kidney injury) 06/08/2022   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/10/2021   Alcohol use 09/12/2020   Anemia of chronic disease 04/25/2022   Arthritis    Atherosclerosis of native arteries of extremities with intermittent claudication, bilateral legs 11/10/2021   Bilateral leg edema 02/16/2022   BPH (benign prostatic hyperplasia) 11/10/2021   CHF (congestive heart failure) (HCC)    Chronic diastolic CHF (congestive heart failure) (HCC) 04/25/2022   Chronic hyponatremia 04/25/2022   Chronic respiratory failure with hypoxia (HCC) 05/09/2022   Cigarette smoker 10/21/2014   Class 1 obesity 04/25/2022   Cognitive communication deficit 03/05/2021   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)    Diabetic polyneuropathy associated with type 2 diabetes mellitus (HCC) 08/28/2022   Dyspnea    Dysrhythmia    atrial fibrillation   Elevated glucose 05/27/2022   Encounter for prostate cancer screening 02/16/2022   Encounter for screening for lung cancer 02/16/2022   Essential hypertension 10/21/2014   Femur fracture, right (HCC) 09/12/2020   GERD  (gastroesophageal reflux disease)    History of kidney stones    Hyperlipidemia 01/24/2017   Hypertension    Hypo-osmolality and hyponatremia 03/05/2021   Hyponatremia 04/26/2022   Hypotension 08/05/2022   Kidney stones 01/24/2017   Laceration of right eyebrow 06/20/2022   Medication monitoring encounter 12/18/2022   Moderate aortic stenosis 2024   Muscle wasting and atrophy, not elsewhere classified, multiple sites 03/05/2021   Nicotine  dependence, cigarettes, uncomplicated 03/05/2021   Non-seasonal allergic rhinitis due to pollen 06/06/2022   Other abnormalities of gait and mobility 03/05/2021   Other chest pain 12/18/2022   Other lack of coordination 03/05/2021   Oxygen  dependent 08/05/2022   Paroxysmal atrial fibrillation (HCC) 10/21/2014   Pressure injury of skin 09/16/2020   Right ureteral stone 06/08/2022   Sepsis secondary to UTI (HCC) 06/08/2022   SOB (shortness of breath) 05/10/2022   Syncope and collapse 08/05/2022   Tobacco abuse 02/16/2022   Tobacco use disorder 02/16/2022   Type 2 diabetes mellitus (HCC) 06/08/2022   Visit for suture removal 06/15/2022     Assessment: Patient Reported Symptoms:  Cognitive        Neurological      HEENT        Cardiovascular Cardiovascular Symptoms Reported: Swelling in legs or feet Does patient have uncontrolled Hypertension?: Yes Patient's Recent BP reading at home: not weighing poor balance BP broken Cardiovascular Management Strategies: Medical device, Medication therapy, Fluid modification Do You Have a Working Readable Scale?: Yes Cardiovascular Self-Management Outcome: 3 (uncertain)  Respiratory Respiratory Symptoms Reported: Shortness of breath, Wheezing Respiratory Self-Management Outcome: 3 (uncertain)  Endocrine  Gastrointestinal        Genitourinary      Integumentary      Musculoskeletal          Psychosocial            10/27/2023    PHQ2-9 Depression Screening   Little interest or  pleasure in doing things    Feeling down, depressed, or hopeless    PHQ-2 - Total Score    Trouble falling or staying asleep, or sleeping too much    Feeling tired or having little energy    Poor appetite or overeating     Feeling bad about yourself - or that you are a failure or have let yourself or your family down    Trouble concentrating on things, such as reading the newspaper or watching television    Moving or speaking so slowly that other people could have noticed.  Or the opposite - being so fidgety or restless that you have been moving around a lot more than usual    Thoughts that you would be better off dead, or hurting yourself in some way    PHQ2-9 Total Score    If you checked off any problems, how difficult have these problems made it for you to do your work, take care of things at home, or get along with other people    Depression Interventions/Treatment      There were no vitals filed for this visit.  Medications Reviewed Today   Medications were not reviewed in this encounter     Recommendation:   PCP Follow-up  Follow Up Plan:   Telephone follow-up pending new rn cm   Doneshia Hill L. Ramonita, RN, BSN, CCM [place]  [facility], [facility] RN Care Manager Direct Dial: [phone]  Fax: [phone]

## 2023-09-14 ENCOUNTER — Other Ambulatory Visit: Payer: Self-pay | Admitting: Family Medicine

## 2023-09-17 ENCOUNTER — Other Ambulatory Visit: Payer: Self-pay | Admitting: Physician Assistant

## 2023-09-17 DIAGNOSIS — D509 Iron deficiency anemia, unspecified: Secondary | ICD-10-CM

## 2023-09-21 ENCOUNTER — Ambulatory Visit: Admitting: Pulmonary Disease

## 2023-09-21 ENCOUNTER — Other Ambulatory Visit: Payer: Self-pay | Admitting: Podiatry

## 2023-09-21 ENCOUNTER — Encounter: Payer: Self-pay | Admitting: Pulmonary Disease

## 2023-09-21 ENCOUNTER — Telehealth: Payer: Self-pay

## 2023-09-21 VITALS — BP 131/60 | HR 53 | Ht 67.0 in | Wt 237.0 lb

## 2023-09-21 DIAGNOSIS — R911 Solitary pulmonary nodule: Secondary | ICD-10-CM

## 2023-09-21 DIAGNOSIS — J449 Chronic obstructive pulmonary disease, unspecified: Secondary | ICD-10-CM

## 2023-09-21 DIAGNOSIS — F17210 Nicotine dependence, cigarettes, uncomplicated: Secondary | ICD-10-CM

## 2023-09-21 DIAGNOSIS — R600 Localized edema: Secondary | ICD-10-CM | POA: Diagnosis not present

## 2023-09-21 MED ORDER — PREDNISONE 10 MG PO TABS: 40.0000 mg | ORAL_TABLET | Freq: Every day | ORAL | 0 refills | Status: DC

## 2023-09-21 MED ORDER — ALBUTEROL SULFATE (2.5 MG/3ML) 0.083% IN NEBU: 2.5000 mg | INHALATION_SOLUTION | Freq: Four times a day (QID) | RESPIRATORY_TRACT | 11 refills | Status: AC | PRN

## 2023-09-21 NOTE — Telephone Encounter (Signed)
 Ohtuvayre  paperwork handed into Pharmacy

## 2023-09-21 NOTE — Progress Notes (Signed)
 Synopsis: Referred in February 2024 for COPD by Gilmore Setter, FNP  Subjective:   PATIENT ID: Kyle Farley GENDER: male DOB: [birth year], MRN: [id number]  HPI  Chief Complaint  Patient presents with   Medical Management of Chronic Issues    Pt states SOB, Low bp x 2 weeks    Lindwood Mogel is an 83 year old male, daily smoker with history of GERD, hypertension, paroxysmal atrial fibrillation, COPD with nocturnal hypoxemia who returns to pulmonary clinic for COPD.   Initial OV 03/10/22 He reports progressive shortness of breath over the past 4-5 years. He is using trelegy 100, 1 puff daily and as needed albuterol  inhaler or nebulizer treatments. He has significant dyspnea with any exertional activity. He uses a walker to ambulate. He is accompanied by his son. He complains of chest tightness or discomfort when he breathing gets bad. He has intermittent wheezing.   He is smoking 1-2 packs per day, he has been smoking for 68 years. He retired 10 years ago and lives with his son. He is not interested in quitting smoking.   OV 08/18/22 He complains of significant shortness of breath. He is using albuterol  frequently. He is using trelegy daily but does not feel he is able to get a good breath in to get the medicine into his lungs.   PFTs today show FEV 1 1.29L (51%) FVC 2.76L (77%) DLCO 47%  He continues to smoke 1.5-2 packs per day.   OV 01/10/23 He has been stable since last visit. Using trelegy daily. He did not notice benefit from budesonide /brovana /yupelri  nebs plus they were more expensive. Continue with dyspnea, cough and sputum production. Smoking 2 packs per day.   OV 09/21/23 Over the last couple of weeks, he experiences worsening shortness of breath and increased wheezing without increased mucus production or cough. He uses Trelegy daily and albuterol  approximately every four hours. He takes mucus pills for dry cough.  A recent fall from his scooter resulted in side pain, possibly  from a bruised rib. He notes fluid buildup in his legs, which he associates with worsening breathing. He takes Lasix , recently increased to two pills daily. He attempts to monitor his weight twice a week, with a recent weight of 237 pounds, up from 235 pounds last week.  He has mild emphysema with a 5 mm nodule in the left lung. He smokes one and a half to two packs a day, except when mowing lawns. His shortness of breath is so severe that he cannot walk to the refrigerator without running out of breath. He has not been able to leave the house much in the past week and a half.  Past Medical History:  Diagnosis Date   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 (HCC) 11/10/2021   Acquired hypothyroidism 11/10/2021   Acute hypoxic respiratory failure (HCC) 04/25/2022   Acute on chronic diastolic heart failure (HCC) 04/25/2022   Acute on chronic respiratory failure with hypoxia (HCC) 04/25/2022   Acute on chronic systolic heart failure (HCC) 11/10/2021   AKI (acute kidney injury) (HCC) 06/08/2022   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/10/2021   Alcohol use 09/12/2020   Anemia of chronic disease 04/25/2022   Arthritis    Atherosclerosis of native arteries of extremities with intermittent claudication, bilateral legs (HCC) 11/10/2021   Bilateral leg edema 02/16/2022   BPH (benign prostatic hyperplasia) 11/10/2021   CHF (congestive heart failure) (HCC)    Chronic diastolic CHF (congestive heart failure) (HCC) 04/25/2022  Chronic hyponatremia 04/25/2022   Chronic respiratory failure with hypoxia (HCC) 05/09/2022   Cigarette smoker 10/21/2014   Class 1 obesity 04/25/2022   Cognitive communication deficit 03/05/2021   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)    Diabetic polyneuropathy associated with type 2 diabetes mellitus (HCC) 08/28/2022   Dyspnea    Dysrhythmia    atrial fibrillation   Elevated glucose 05/27/2022   Encounter for prostate cancer screening  02/16/2022   Encounter for screening for lung cancer 02/16/2022   Essential hypertension 10/21/2014   Femur fracture, right (HCC) 09/12/2020   GERD (gastroesophageal reflux disease)    History of kidney stones    Hyperlipidemia 01/24/2017   Hypertension    Hypo-osmolality and hyponatremia 03/05/2021   Hyponatremia 04/26/2022   Hypotension 08/05/2022   Kidney stones 01/24/2017   Laceration of right eyebrow 06/20/2022   Medication monitoring encounter 12/18/2022   Moderate aortic stenosis 2024   Muscle wasting and atrophy, not elsewhere classified, multiple sites 03/05/2021   Nicotine  dependence, cigarettes, uncomplicated 03/05/2021   Non-seasonal allergic rhinitis due to pollen 06/06/2022   Other abnormalities of gait and mobility 03/05/2021   Other chest pain 12/18/2022   Other lack of coordination 03/05/2021   Oxygen  dependent 08/05/2022   Paroxysmal atrial fibrillation (HCC) 10/21/2014   Pressure injury of skin 09/16/2020   Right ureteral stone 06/08/2022   Sepsis secondary to UTI (HCC) 06/08/2022   SOB (shortness of breath) 05/10/2022   Syncope and collapse 08/05/2022   Tobacco abuse 02/16/2022   Tobacco use disorder 02/16/2022   Type 2 diabetes mellitus (HCC) 06/08/2022   Visit for suture removal 06/15/2022     Family History  Problem Relation Age of Onset   Cancer Mother    Diabetes Mother    Breast cancer Sister      Social History   Socioeconomic History   Marital status: Widowed    Spouse name: Not on file   Number of children: 3   Years of education: Not on file   Highest education level: High school graduate  Occupational History   Occupation: retired  Tobacco Use   Smoking status: Every Day    Current packs/day: 1.50    Average packs/day: 1.5 packs/day for 62.0 years (93.0 ttl pk-yrs)    Types: Cigarettes, Cigars   Smokeless tobacco: Never  Vaping Use   Vaping status: Never Used  Substance and Sexual Activity   Alcohol use: Yes    Alcohol/week:  5.0 standard drinks of alcohol    Types: 5 Standard drinks or equivalent per week    Comment: 4-5 beers per day   Drug use: No   Sexual activity: Not Currently  Other Topics Concern   Not on file  Social History Narrative   Not on file   Social Drivers of Health   Financial Resource Strain: Low Risk  (06/21/2023)   Overall Financial Resource Strain (CARDIA)    Difficulty of Paying Living Expenses: Not hard at all  Food Insecurity: No Food Insecurity (07/27/2023)   Hunger Vital Sign    Worried About Running Out of Food in the Last Year: Never true    Ran Out of Food in the Last Year: Never true  Transportation Needs: No Transportation Needs (08/08/2023)   PRAPARE - Administrator, Civil Service (Medical): No    Lack of Transportation (Non-Medical): No  Physical Activity: Inactive (06/21/2023)   Exercise Vital Sign    Days of Exercise per Week: 0 days  Minutes of Exercise per Session: 0 min  Stress: No Stress Concern Present (06/21/2023)   [employer] of Occupational Health - Occupational Stress Questionnaire    Feeling of Stress : Not at all  Social Connections: Moderately Isolated (06/21/2023)   Social Connection and Isolation Panel    Frequency of Communication with Friends and Family: More than three times a week    Frequency of Social Gatherings with Friends and Family: More than three times a week    Attends Religious Services: More than 4 times per year    Active Member of [employer] or Organizations: No    Attends Banker Meetings: Never    Marital Status: Widowed  Intimate Partner Violence: Not At Risk (07/27/2023)   Humiliation, Afraid, Rape, and Kick questionnaire    Fear of Current or Ex-Partner: No    Emotionally Abused: No    Physically Abused: No    Sexually Abused: No     Allergies  Allergen Reactions   Gabapentin      Edema      Outpatient Medications Prior to Visit  Medication Sig Dispense Refill   Albuterol -Budesonide  (AIRSUPRA )  90-80 MCG/ACT AERO Inhale 2 puffs into the lungs every 4 (four) hours as needed. 10.7 g 5   amiodarone  (PACERONE ) 200 MG tablet Take 1 tablet (200 mg total) by mouth daily. (Patient taking differently: Take 200 mg by mouth 2 (two) times daily.)     amLODipine  (NORVASC ) 5 MG tablet Take 1 tablet by mouth once daily 90 tablet 0   apixaban  (ELIQUIS ) 5 MG TABS tablet Take 1 tablet (5 mg total) by mouth 2 (two) times daily. 180 tablet 3   atorvastatin  (LIPITOR) 40 MG tablet Take 1 tablet by mouth once daily 90 tablet 1   Docusate Sodium  (STOOL SOFTENER LAXATIVE PO) Take 1 tablet by mouth daily.     ferrous sulfate  325 (65 FE) MG EC tablet Take 1 tablet by mouth once daily 90 tablet 3   Finerenone  (KERENDIA ) 20 MG TABS Take 1 tablet (20 mg total) by mouth daily. 90 tablet 0   Fluticasone -Umeclidin-Vilant (TRELEGY ELLIPTA ) 200-62.5-25 MCG/ACT AEPB Inhale 1 puff into the lungs daily. 180 each 3   furosemide  (LASIX ) 40 MG tablet Take 1 tablet (40 mg total) by mouth 2 (two) times daily. 60 tablet 3   JARDIANCE  25 MG TABS tablet TAKE 1 TABLET BY MOUTH ONCE DAILY BEFORE BREAKFAST 90 tablet 0   levothyroxine  (SYNTHROID ) 75 MCG tablet Take 1 tablet (75 mcg total) by mouth daily. 90 tablet 3   metoprolol  succinate (TOPROL -XL) 50 MG 24 hr tablet TAKE 1 TABLET BY MOUTH ONCE DAILY -  TAKE  WITH  OR  IMMEDIATELY  FOLLOWING  A  MEAL 90 tablet 1   montelukast  (SINGULAIR ) 10 MG tablet TAKE 1 TABLET BY MOUTH ONCE DAILY AT  12  NOON 90 tablet 0   nitroGLYCERIN  (NITROSTAT ) 0.4 MG SL tablet DISSOLVE ONE TABLET UNDER THE TONGUE EVERY 5 MINUTES AS NEEDED FOR CHEST PAIN.  DO NOT EXCEED A TOTAL OF 3 DOSES IN 15 MINUTES 50 tablet 0   OXYGEN  Inhale 3 L into the lungs as needed (shortness of breath).     pantoprazole  (PROTONIX ) 40 MG tablet Take 1 tablet by mouth once daily 90 tablet 1   tamsulosin  (FLOMAX ) 0.4 MG CAPS capsule Take 1 capsule by mouth once daily 90 capsule 0   albuterol  ([name] ) (2.5 MG/3ML) 0.083% nebulizer  solution Take 2.5 mg by nebulization every 6 (six) hours as  needed for shortness of breath or wheezing.     No facility-administered medications prior to visit.   Review of Systems  Constitutional:  Negative for chills, fever, malaise/fatigue and weight loss.  HENT:  Negative for congestion, sinus pain and sore throat.   Eyes: Negative.   Respiratory:  Positive for cough and shortness of breath. Negative for hemoptysis, sputum production and wheezing.   Cardiovascular:  Negative for chest pain, palpitations, orthopnea, claudication and leg swelling.  Gastrointestinal:  Negative for abdominal pain, heartburn, nausea and vomiting.  Genitourinary: Negative.   Musculoskeletal:  Negative for joint pain and myalgias.  Skin:  Negative for rash.  Neurological:  Negative for weakness.  Endo/Heme/Allergies: Negative.   Psychiatric/Behavioral: Negative.     Objective:   Vitals:   09/21/23 0827  BP: 131/60  Pulse: (!) 53  SpO2: 90%  Weight: 237 lb (107.5 kg)  Height: 5' 7 (1.702 m)   Physical Exam Constitutional:      General: He is not in acute distress. HENT:     Head: Normocephalic and atraumatic.  Eyes:     Conjunctiva/sclera: Conjunctivae normal.  Cardiovascular:     Rate and Rhythm: Normal rate and regular rhythm.     Pulses: Normal pulses.     Heart sounds: Normal heart sounds. No murmur heard. Pulmonary:     Breath sounds: No wheezing or rhonchi.  Musculoskeletal:     Right lower leg: No edema.     Left lower leg: No edema.  Skin:    General: Skin is warm and dry.  Neurological:     General: No focal deficit present.     Mental Status: He is alert.    CBC    Component Value Date/Time   WBC 9.5 06/21/2023 1054   WBC 10.3 06/12/2022 0251   RBC 4.33 06/21/2023 1054   RBC 4.04 08/12/2022 0000   HGB 13.3 06/21/2023 1054   HCT 41.4 06/21/2023 1054   PLT 213 06/21/2023 1054   MCV 96 06/21/2023 1054   MCH 30.7 06/21/2023 1054   MCH 27.1 06/12/2022 0251   MCHC 32.1  06/21/2023 1054   MCHC 32.2 06/12/2022 0251   RDW 13.5 06/21/2023 1054   LYMPHSABS 1.5 06/21/2023 1054   MONOABS 0.2 04/25/2022 0638   EOSABS 0.1 06/21/2023 1054   BASOSABS 0.1 06/21/2023 1054      Latest Ref Rng & Units 07/21/2023    9:35 AM 06/21/2023   10:54 AM 05/20/2023    9:06 AM  BMP  Glucose 70 - 99 mg/dL 89  87  889   BUN 8 - 27 mg/dL 13  14  15    Creatinine 0.76 - 1.27 mg/dL 8.85  8.89  8.86   BUN/Creat Ratio 10 - 24 11  13  13    Sodium 134 - 144 mmol/L 141  138  139   Potassium 3.5 - 5.2 mmol/L 4.0  4.7  4.7   Chloride 96 - 106 mmol/L 96  97  99   CO2 20 - 29 mmol/L 27  23  26    Calcium  8.6 - 10.2 mg/dL 9.3  9.5  9.2    Chest imaging: CT Chest 12/[facility] FINDINGS: Mediastinal structures within normal limits. No lymphadenopathy. No cardiomegaly. Moderate coronary artery calcifications. Mild emphysema. No consolidation. Left lung base 5 mm nodule, image 98. Pleural effusion or pneumothorax. Limited assessment due to breathing motion.   PFT:    Latest Ref Rng & Units 08/18/2022   10:35 AM  PFT Results  FVC-Pre L 2.76   FVC-Predicted Pre % 77   Pre FEV1/FVC % % 47   FEV1-Pre L 1.29   FEV1-Predicted Pre % 51   DLCO uncorrected ml/min/mmHg 10.57   DLCO UNC% % 47   DLVA Predicted % 65   TLC L 8.77   TLC % Predicted % 133   RV % Predicted % 268     Labs:  Path:  Echo:  Heart Catheterization:  Assessment & Plan:   Lung nodule - Plan: CT Chest Wo Contrast  Chronic obstructive pulmonary disease, unspecified COPD type (HCC) - Plan: albuterol  ([name] ) (2.5 MG/3ML) 0.083% nebulizer solution, predniSONE  (DELTASONE ) 10 MG tablet  Discussion: Kache Mcclurg is an 83 year old male, daily smoker with history of GERD, hypertension, paroxysmal atrial fibrillation, COPD with nocturnal hypoxemia who returns to pulmonary clinic for COPD.   Chronic obstructive pulmonary disease (COPD)  He is having increasing shortness of breath and cough. - Initiate  Ohtuvayre  nebulizer treatment pending insurance approval - Continue Trelegy 1 puff daily and albuterol  as needed - Prescribe prednisone  40 mg for 5 days for short-term relief of dyspnea  Solitary pulmonary nodule, left lung 5 mm nodule in left lung with concern for malignancy due to smoking history. - Schedule follow-up CT scan in December to monitor pulmonary nodule.  Lower extremity edema Edema likely due to fluid retention, possibly exacerbated by lung issues affecting heart function. - Advise weighing every other day to monitor fluid retention. - Consider increasing Lasix  dose if significant weight fluctuations are noted.  Tobacco use disorder Continues heavy smoking, negatively impacting lung health and pulmonary nodule.  Follow up in 3 months  Dorn Chill, MD [facility] Office: [phone]    Current Outpatient Medications:    Albuterol -Budesonide  (AIRSUPRA ) 90-80 MCG/ACT AERO, Inhale 2 puffs into the lungs every 4 (four) hours as needed., Disp: 10.7 g, Rfl: 5   amiodarone  (PACERONE ) 200 MG tablet, Take 1 tablet (200 mg total) by mouth daily. (Patient taking differently: Take 200 mg by mouth 2 (two) times daily.), Disp: , Rfl:    amLODipine  (NORVASC ) 5 MG tablet, Take 1 tablet by mouth once daily, Disp: 90 tablet, Rfl: 0   apixaban  (ELIQUIS ) 5 MG TABS tablet, Take 1 tablet (5 mg total) by mouth 2 (two) times daily., Disp: 180 tablet, Rfl: 3   atorvastatin  (LIPITOR) 40 MG tablet, Take 1 tablet by mouth once daily, Disp: 90 tablet, Rfl: 1   Docusate Sodium  (STOOL SOFTENER LAXATIVE PO), Take 1 tablet by mouth daily., Disp: , Rfl:    ferrous sulfate  325 (65 FE) MG EC tablet, Take 1 tablet by mouth once daily, Disp: 90 tablet, Rfl: 3   Finerenone  (KERENDIA ) 20 MG TABS, Take 1 tablet (20 mg total) by mouth daily., Disp: 90 tablet, Rfl: 0   Fluticasone -Umeclidin-Vilant (TRELEGY ELLIPTA ) 200-62.5-25 MCG/ACT AEPB, Inhale 1 puff into the lungs daily., Disp:  180 each, Rfl: 3   furosemide  (LASIX ) 40 MG tablet, Take 1 tablet (40 mg total) by mouth 2 (two) times daily., Disp: 60 tablet, Rfl: 3   JARDIANCE  25 MG TABS tablet, TAKE 1 TABLET BY MOUTH ONCE DAILY BEFORE BREAKFAST, Disp: 90 tablet, Rfl: 0   levothyroxine  (SYNTHROID ) 75 MCG tablet, Take 1 tablet (75 mcg total) by mouth daily., Disp: 90 tablet, Rfl: 3   metoprolol  succinate (TOPROL -XL) 50 MG 24 hr tablet, TAKE 1 TABLET BY MOUTH ONCE DAILY -  TAKE  WITH  OR  IMMEDIATELY  FOLLOWING  A  MEAL, Disp: 90 tablet, Rfl: 1   montelukast  (SINGULAIR ) 10 MG tablet, TAKE 1 TABLET BY MOUTH ONCE DAILY AT  12  NOON, Disp: 90 tablet, Rfl: 0   nitroGLYCERIN  (NITROSTAT ) 0.4 MG SL tablet, DISSOLVE ONE TABLET UNDER THE TONGUE EVERY 5 MINUTES AS NEEDED FOR CHEST PAIN.  DO NOT EXCEED A TOTAL OF 3 DOSES IN 15 MINUTES, Disp: 50 tablet, Rfl: 0   OXYGEN , Inhale 3 L into the lungs as needed (shortness of breath)., Disp: , Rfl:    pantoprazole  (PROTONIX ) 40 MG tablet, Take 1 tablet by mouth once daily, Disp: 90 tablet, Rfl: 1   predniSONE  (DELTASONE ) 10 MG tablet, Take 4 tablets (40 mg total) by mouth daily with breakfast., Disp: 20 tablet, Rfl: 0   tamsulosin  (FLOMAX ) 0.4 MG CAPS capsule, Take 1 capsule by mouth once daily, Disp: 90 capsule, Rfl: 0   albuterol  ([name] ) (2.5 MG/3ML) 0.083% nebulizer solution, Take 3 mLs (2.5 mg total) by nebulization every 6 (six) hours as needed for shortness of breath or wheezing., Disp: 120 mL, Rfl: 11

## 2023-09-21 NOTE — Patient Instructions (Addendum)
 Continue trelegy ellipta  1 puff daily  Continue to use Air Supra and albuterol  nebulizer treatments as needed  Start Ohtuvayre  nebulizer treatments twice daily - we will complete paperwork today  Start prednisone  40mg  daily for your shortness of breath  Follow up in December after CT Chest scan to follow up on lung nodule in left lower lung

## 2023-09-22 NOTE — Telephone Encounter (Signed)
 Refill for Quetenza patches sent in

## 2023-09-23 ENCOUNTER — Telehealth: Payer: Self-pay

## 2023-09-23 NOTE — Telephone Encounter (Signed)
 Received Ohtuvayre  new start paperwork. Completed form and faxed with clinicals and insurance card copy to [facility]   Phone#: [phone] Fax#: [phone]

## 2023-09-23 NOTE — Telephone Encounter (Signed)
 Received fax from [employer] confirming receipt of Ohtuvayre  enrollment form  Patient ID: [id number]

## 2023-09-26 NOTE — Telephone Encounter (Signed)
 Received fax from [employer] with summary of benefits. Referral form for Ohtuvayre  received. Rx will be triaged to [facility].. Once benefits investigation completed, pharmacy will reach out the patient to schedule shipment. If medication is unaffordable, patient will need to express financial hardship to be referred back to [place] [facility] for patient assistance program pre-screening.   Patient ID: [id number] Pharmacy phone: [phone] [place] [facility] Phone#: [phone]

## 2023-09-26 NOTE — Telephone Encounter (Signed)
 Received fax from [facility] that Section 5 of the enrollment form was missing the provider's first and last name. Completed missing information and refaxed to [employer].

## 2023-10-04 ENCOUNTER — Encounter (INDEPENDENT_AMBULATORY_CARE_PROVIDER_SITE_OTHER): Admitting: Family Medicine

## 2023-10-04 ENCOUNTER — Encounter: Payer: Self-pay | Admitting: Family Medicine

## 2023-10-04 DIAGNOSIS — J449 Chronic obstructive pulmonary disease, unspecified: Secondary | ICD-10-CM

## 2023-10-04 DIAGNOSIS — Z7951 Long term (current) use of inhaled steroids: Secondary | ICD-10-CM

## 2023-10-04 NOTE — Progress Notes (Signed)
 No showed

## 2023-10-08 ENCOUNTER — Inpatient Hospital Stay (HOSPITAL_COMMUNITY)
Admission: EM | Admit: 2023-10-08 | Discharge: 2023-10-14 | DRG: 189 | Disposition: A | Discharge status: Home Health | Attending: Internal Medicine | Admitting: Internal Medicine

## 2023-10-08 ENCOUNTER — Encounter (HOSPITAL_COMMUNITY): Payer: Self-pay

## 2023-10-08 ENCOUNTER — Other Ambulatory Visit: Payer: Self-pay

## 2023-10-08 ENCOUNTER — Emergency Department (HOSPITAL_COMMUNITY)

## 2023-10-08 DIAGNOSIS — Z7984 Long term (current) use of oral hypoglycemic drugs: Secondary | ICD-10-CM

## 2023-10-08 DIAGNOSIS — I2510 Atherosclerotic heart disease of native coronary artery without angina pectoris: Secondary | ICD-10-CM | POA: Diagnosis present

## 2023-10-08 DIAGNOSIS — Z7951 Long term (current) use of inhaled steroids: Secondary | ICD-10-CM

## 2023-10-08 DIAGNOSIS — J9690 Respiratory failure, unspecified, unspecified whether with hypoxia or hypercapnia: Secondary | ICD-10-CM | POA: Diagnosis not present

## 2023-10-08 DIAGNOSIS — Z79899 Other long term (current) drug therapy: Secondary | ICD-10-CM

## 2023-10-08 DIAGNOSIS — J9621 Acute and chronic respiratory failure with hypoxia: Secondary | ICD-10-CM | POA: Diagnosis not present

## 2023-10-08 DIAGNOSIS — I4510 Unspecified right bundle-branch block: Secondary | ICD-10-CM | POA: Diagnosis present

## 2023-10-08 DIAGNOSIS — T462X5A Adverse effect of other antidysrhythmic drugs, initial encounter: Secondary | ICD-10-CM | POA: Diagnosis present

## 2023-10-08 DIAGNOSIS — Z87442 Personal history of urinary calculi: Secondary | ICD-10-CM

## 2023-10-08 DIAGNOSIS — F17210 Nicotine dependence, cigarettes, uncomplicated: Secondary | ICD-10-CM | POA: Diagnosis not present

## 2023-10-08 DIAGNOSIS — R918 Other nonspecific abnormal finding of lung field: Secondary | ICD-10-CM | POA: Diagnosis not present

## 2023-10-08 DIAGNOSIS — I130 Hypertensive heart and chronic kidney disease with heart failure and stage 1 through stage 4 chronic kidney disease, or unspecified chronic kidney disease: Secondary | ICD-10-CM | POA: Diagnosis present

## 2023-10-08 DIAGNOSIS — E1122 Type 2 diabetes mellitus with diabetic chronic kidney disease: Secondary | ICD-10-CM | POA: Diagnosis present

## 2023-10-08 DIAGNOSIS — I480 Paroxysmal atrial fibrillation: Secondary | ICD-10-CM | POA: Diagnosis not present

## 2023-10-08 DIAGNOSIS — J9601 Acute respiratory failure with hypoxia: Secondary | ICD-10-CM | POA: Diagnosis not present

## 2023-10-08 DIAGNOSIS — Z8673 Personal history of transient ischemic attack (TIA), and cerebral infarction without residual deficits: Secondary | ICD-10-CM

## 2023-10-08 DIAGNOSIS — Z833 Family history of diabetes mellitus: Secondary | ICD-10-CM

## 2023-10-08 DIAGNOSIS — I10 Essential (primary) hypertension: Secondary | ICD-10-CM | POA: Diagnosis not present

## 2023-10-08 DIAGNOSIS — N1832 Chronic kidney disease, stage 3b: Secondary | ICD-10-CM | POA: Diagnosis not present

## 2023-10-08 DIAGNOSIS — E039 Hypothyroidism, unspecified: Secondary | ICD-10-CM | POA: Diagnosis present

## 2023-10-08 DIAGNOSIS — I5040 Unspecified combined systolic (congestive) and diastolic (congestive) heart failure: Secondary | ICD-10-CM | POA: Diagnosis not present

## 2023-10-08 DIAGNOSIS — E871 Hypo-osmolality and hyponatremia: Secondary | ICD-10-CM | POA: Diagnosis present

## 2023-10-08 DIAGNOSIS — Z79890 Hormone replacement therapy (postmenopausal): Secondary | ICD-10-CM

## 2023-10-08 DIAGNOSIS — Z7901 Long term (current) use of anticoagulants: Secondary | ICD-10-CM

## 2023-10-08 DIAGNOSIS — R61 Generalized hyperhidrosis: Secondary | ICD-10-CM | POA: Diagnosis present

## 2023-10-08 DIAGNOSIS — R0603 Acute respiratory distress: Secondary | ICD-10-CM | POA: Diagnosis not present

## 2023-10-08 DIAGNOSIS — N179 Acute kidney failure, unspecified: Secondary | ICD-10-CM | POA: Diagnosis present

## 2023-10-08 DIAGNOSIS — I350 Nonrheumatic aortic (valve) stenosis: Secondary | ICD-10-CM | POA: Diagnosis present

## 2023-10-08 DIAGNOSIS — E119 Type 2 diabetes mellitus without complications: Secondary | ICD-10-CM | POA: Diagnosis not present

## 2023-10-08 DIAGNOSIS — R911 Solitary pulmonary nodule: Secondary | ICD-10-CM | POA: Diagnosis present

## 2023-10-08 DIAGNOSIS — J441 Chronic obstructive pulmonary disease with (acute) exacerbation: Secondary | ICD-10-CM | POA: Diagnosis not present

## 2023-10-08 DIAGNOSIS — I959 Hypotension, unspecified: Secondary | ICD-10-CM | POA: Diagnosis present

## 2023-10-08 DIAGNOSIS — R001 Bradycardia, unspecified: Secondary | ICD-10-CM | POA: Diagnosis present

## 2023-10-08 DIAGNOSIS — I5042 Chronic combined systolic (congestive) and diastolic (congestive) heart failure: Secondary | ICD-10-CM | POA: Diagnosis present

## 2023-10-08 DIAGNOSIS — Z794 Long term (current) use of insulin: Secondary | ICD-10-CM

## 2023-10-08 DIAGNOSIS — R0602 Shortness of breath: Secondary | ICD-10-CM | POA: Diagnosis not present

## 2023-10-08 DIAGNOSIS — R600 Localized edema: Secondary | ICD-10-CM

## 2023-10-08 DIAGNOSIS — Z888 Allergy status to other drugs, medicaments and biological substances status: Secondary | ICD-10-CM

## 2023-10-08 DIAGNOSIS — K219 Gastro-esophageal reflux disease without esophagitis: Secondary | ICD-10-CM | POA: Diagnosis present

## 2023-10-08 DIAGNOSIS — E785 Hyperlipidemia, unspecified: Secondary | ICD-10-CM | POA: Diagnosis present

## 2023-10-08 DIAGNOSIS — Z6837 Body mass index (BMI) 37.0-37.9, adult: Secondary | ICD-10-CM

## 2023-10-08 DIAGNOSIS — F1014 Alcohol abuse with alcohol-induced mood disorder: Secondary | ICD-10-CM | POA: Diagnosis present

## 2023-10-08 DIAGNOSIS — I272 Pulmonary hypertension, unspecified: Secondary | ICD-10-CM | POA: Diagnosis present

## 2023-10-08 DIAGNOSIS — D631 Anemia in chronic kidney disease: Secondary | ICD-10-CM | POA: Diagnosis present

## 2023-10-08 DIAGNOSIS — E66811 Obesity, class 1: Secondary | ICD-10-CM | POA: Diagnosis present

## 2023-10-08 DIAGNOSIS — Z803 Family history of malignant neoplasm of breast: Secondary | ICD-10-CM

## 2023-10-08 DIAGNOSIS — I517 Cardiomegaly: Secondary | ICD-10-CM | POA: Diagnosis not present

## 2023-10-08 DIAGNOSIS — J449 Chronic obstructive pulmonary disease, unspecified: Secondary | ICD-10-CM | POA: Diagnosis present

## 2023-10-08 DIAGNOSIS — Z9981 Dependence on supplemental oxygen: Secondary | ICD-10-CM

## 2023-10-08 DIAGNOSIS — N400 Enlarged prostate without lower urinary tract symptoms: Secondary | ICD-10-CM | POA: Diagnosis present

## 2023-10-08 LAB — CBC WITH DIFFERENTIAL/PLATELET
Abs Immature Granulocytes: 0.09 K/uL — ABNORMAL HIGH (ref 0.00–0.07)
Basophils Absolute: 0.1 K/uL (ref 0.0–0.1)
Basophils Relative: 1 %
Eosinophils Absolute: 0.1 K/uL (ref 0.0–0.5)
Eosinophils Relative: 1 %
HCT: 49.1 % (ref 39.0–52.0)
Hemoglobin: 15.4 g/dL (ref 13.0–17.0)
Immature Granulocytes: 1 %
Lymphocytes Relative: 13 %
Lymphs Abs: 1.9 K/uL (ref 0.7–4.0)
MCH: 30.7 pg (ref 26.0–34.0)
MCHC: 31.4 g/dL (ref 30.0–36.0)
MCV: 97.8 fL (ref 80.0–100.0)
Monocytes Absolute: 1.2 K/uL — ABNORMAL HIGH (ref 0.1–1.0)
Monocytes Relative: 8 %
Neutro Abs: 11.3 K/uL — ABNORMAL HIGH (ref 1.7–7.7)
Neutrophils Relative %: 76 %
Platelets: 210 K/uL (ref 150–400)
RBC: 5.02 MIL/uL (ref 4.22–5.81)
RDW: 13.4 % (ref 11.5–15.5)
WBC: 14.7 K/uL — ABNORMAL HIGH (ref 4.0–10.5)
nRBC: 0 % (ref 0.0–0.2)

## 2023-10-08 LAB — I-STAT VENOUS BLOOD GAS, ED
Acid-base deficit: 3 mmol/L — ABNORMAL HIGH (ref 0.0–2.0)
Bicarbonate: 27.6 mmol/L (ref 20.0–28.0)
Calcium, Ion: 1.19 mmol/L (ref 1.15–1.40)
HCT: 50 % (ref 39.0–52.0)
Hemoglobin: 17 g/dL (ref 13.0–17.0)
O2 Saturation: 81 %
Potassium: 4.5 mmol/L (ref 3.5–5.1)
Sodium: 129 mmol/L — ABNORMAL LOW (ref 135–145)
TCO2: 30 mmol/L (ref 22–32)
pCO2, Ven: 74.1 mmHg (ref 44–60)
pH, Ven: 7.178 — CL (ref 7.25–7.43)
pO2, Ven: 58 mmHg — ABNORMAL HIGH (ref 32–45)

## 2023-10-08 LAB — I-STAT CHEM 8, ED
BUN: 22 mg/dL (ref 8–23)
Calcium, Ion: 1.16 mmol/L (ref 1.15–1.40)
Chloride: 94 mmol/L — ABNORMAL LOW (ref 98–111)
Creatinine, Ser: 1.5 mg/dL — ABNORMAL HIGH (ref 0.61–1.24)
Glucose, Bld: 168 mg/dL — ABNORMAL HIGH (ref 70–99)
HCT: 51 % (ref 39.0–52.0)
Hemoglobin: 17.3 g/dL — ABNORMAL HIGH (ref 13.0–17.0)
Potassium: 4.5 mmol/L (ref 3.5–5.1)
Sodium: 131 mmol/L — ABNORMAL LOW (ref 135–145)
TCO2: 26 mmol/L (ref 22–32)

## 2023-10-08 LAB — CBG MONITORING, ED: Glucose-Capillary: 197 mg/dL — ABNORMAL HIGH (ref 70–99)

## 2023-10-08 LAB — I-STAT CG4 LACTIC ACID, ED: Lactic Acid, Venous: 3.1 mmol/L (ref 0.5–1.9)

## 2023-10-08 MED ORDER — ETOMIDATE 2 MG/ML IV SOLN
INTRAVENOUS | Status: AC
  Filled 2023-10-08: qty 10

## 2023-10-08 MED ORDER — SUCCINYLCHOLINE CHLORIDE 200 MG/10ML IV SOSY
PREFILLED_SYRINGE | INTRAVENOUS | Status: AC
  Filled 2023-10-08: qty 10

## 2023-10-08 MED ORDER — ATROPINE SULFATE 1 MG/10ML IJ SOSY
PREFILLED_SYRINGE | INTRAMUSCULAR | Status: AC
  Administered 2023-10-08: 1 mg
  Filled 2023-10-08: qty 10

## 2023-10-08 MED ORDER — IPRATROPIUM-ALBUTEROL 0.5-2.5 (3) MG/3ML IN SOLN
RESPIRATORY_TRACT | Status: AC
  Filled 2023-10-08: qty 6

## 2023-10-08 MED ORDER — FUROSEMIDE 10 MG/ML IJ SOLN
40.0000 mg | Freq: Once | INTRAMUSCULAR | Status: AC
  Administered 2023-10-08: 40 mg via INTRAVENOUS
  Filled 2023-10-08: qty 4

## 2023-10-08 NOTE — ED Triage Notes (Signed)
 Pt arrives [facility] with reports of resp distress. Per EMS pt has been [facility] for the last two days. Pt not tolerating CPAP with EMS. Pt unable to speak in sentences and dyspenic on arrival. Pt was given albuterol  with EMS.

## 2023-10-08 NOTE — ED Notes (Signed)
 Nt called CCMD@11 :42pm

## 2023-10-08 NOTE — ED Provider Notes (Signed)
 [place] EMERGENCY DEPARTMENT AT [facility] Provider Note   CSN: [id number] Arrival date & time: 10/08/23  2323     Patient presents with: Respiratory Distress   Kyle Farley is a 83 y.o. male with history of paroxysmal A-fib, CHF with EF of 55 to 60% with but decreased diastolic function of the left ventricle on 7/24, COPD presents today with respiratory distress, progressively worsening x 2 days.  He presents via EMS, CPAP mask present but not on the patient, sats were reportedly in the 80s.  Patient is very ill-appearing, appears.  Rest.  Heart rate is in the low 30s patient is diaphoretic and cool to the touch.  Level 5 caveat due to acuity presentation upon arrival.  Received 2 albuterol  nebs and route with EMS.  Normal pressures and route.  Patient with history of junctional rhythm, he is anticoagulated on Eliquis  with A-fib.   {Add pertinent medical, surgical, social history, OB history to HPI:32947} HPI     Prior to Admission medications   Medication Sig Start Date End Date Taking? Authorizing Provider  albuterol  ([name] ) (2.5 MG/3ML) 0.083% nebulizer solution Take 3 mLs (2.5 mg total) by nebulization every 6 (six) hours as needed for shortness of breath or wheezing. 09/21/23   Kara Dorn NOVAK, MD  Albuterol -Budesonide  (AIRSUPRA ) 90-80 MCG/ACT AERO Inhale 2 puffs into the lungs every 4 (four) hours as needed. 06/21/23   CoxAbigail, MD  amiodarone  (PACERONE ) 200 MG tablet Take 1 tablet (200 mg total) by mouth daily. Patient taking differently: Take 200 mg by mouth 2 (two) times daily. 12/19/22   Sherre Abigail, MD  amLODipine  (NORVASC ) 5 MG tablet Take 1 tablet by mouth once daily 08/09/23   [name], Kirsten, MD  apixaban  (ELIQUIS ) 5 MG TABS tablet Take 1 tablet (5 mg total) by mouth 2 (two) times daily. 05/05/23   Sherre Abigail, MD  atorvastatin  (LIPITOR) 40 MG tablet Take 1 tablet by mouth once daily 05/18/23   Carlin Delon BROCKS, NP  capsaicin  topical system (QUTENZA , 4  PATCH,) 8 % Apply 4 patches topically once for 1 dose. Refill every 90 days 09/22/23 09/22/23  Lamount Ethan LITTIE, DPM  Docusate Sodium  (STOOL SOFTENER LAXATIVE PO) Take 1 tablet by mouth daily.    [provider]  ferrous sulfate  325 (65 FE) MG EC tablet Take 1 tablet by mouth once daily 09/19/23   Milon Cleaves, PA  Finerenone  (KERENDIA ) 20 MG TABS Take 1 tablet (20 mg total) by mouth daily. 07/26/23   CoxAbigail, MD  Fluticasone -Umeclidin-Vilant (TRELEGY ELLIPTA ) 200-62.5-25 MCG/ACT AEPB Inhale 1 puff into the lungs daily. 09/07/23   CoxAbigail, MD  furosemide  (LASIX ) 40 MG tablet Take 1 tablet (40 mg total) by mouth 2 (two) times daily. 09/07/23   [name], Abigail, MD  JARDIANCE  25 MG TABS tablet TAKE 1 TABLET BY MOUTH ONCE DAILY BEFORE BREAKFAST 09/14/23   [name], Abigail, MD  levothyroxine  (SYNTHROID ) 75 MCG tablet Take 1 tablet (75 mcg total) by mouth daily. 03/21/23   CoxAbigail, MD  metoprolol  succinate (TOPROL -XL) 50 MG 24 hr tablet TAKE 1 TABLET BY MOUTH ONCE DAILY -  TAKE  WITH  OR  IMMEDIATELY  FOLLOWING  A  MEAL 07/05/23   Monetta Redell PARAS, MD  montelukast  (SINGULAIR ) 10 MG tablet TAKE 1 TABLET BY MOUTH ONCE DAILY AT  12  NOON 07/13/23   [name], Kirsten, MD  nitroGLYCERIN  (NITROSTAT ) 0.4 MG SL tablet DISSOLVE ONE TABLET UNDER THE TONGUE EVERY 5 MINUTES AS NEEDED  FOR CHEST PAIN.  DO NOT EXCEED A TOTAL OF 3 DOSES IN 15 MINUTES 09/11/23   [name], Kirsten, MD  OXYGEN  Inhale 3 L into the lungs as needed (shortness of breath).    [provider]  pantoprazole  (PROTONIX ) 40 MG tablet Take 1 tablet by mouth once daily 07/10/23   [name], Abigail, MD  predniSONE  (DELTASONE ) 10 MG tablet Take 4 tablets (40 mg total) by mouth daily with breakfast. 09/21/23   Kara Dorn NOVAK, MD  tamsulosin  (FLOMAX ) 0.4 MG CAPS capsule Take 1 capsule by mouth once daily 05/29/23   [name], Abigail, MD    Allergies: Gabapentin     Review of Systems  Updated Vital Signs BP (!) 134/54   Pulse (!) 47   Temp 98.9 F (37.2 C)  (Axillary)   Resp 19   SpO2 100%   Physical Exam  (all labs ordered are listed, but only abnormal results are displayed) Labs Reviewed  CBC WITH DIFFERENTIAL/PLATELET - Abnormal; Notable for the following components:      Result Value   WBC 14.7 (*)    Neutro Abs 11.3 (*)    Monocytes Absolute 1.2 (*)    Abs Immature Granulocytes 0.09 (*)    All other components within normal limits  CBG MONITORING, ED - Abnormal; Notable for the following components:   Glucose-Capillary 197 (*)    All other components within normal limits  I-STAT CHEM 8, ED - Abnormal; Notable for the following components:   Sodium 131 (*)    Chloride 94 (*)    Creatinine, Ser 1.50 (*)    Glucose, Bld 168 (*)    Hemoglobin 17.3 (*)    All other components within normal limits  I-STAT CG4 LACTIC ACID, ED - Abnormal; Notable for the following components:   Lactic Acid, Venous 3.1 (*)    All other components within normal limits  I-STAT VENOUS BLOOD GAS, ED - Abnormal; Notable for the following components:   pH, Ven 7.178 (*)    pCO2, Ven 74.1 (*)    pO2, Ven 58 (*)    Acid-base deficit 3.0 (*)    Sodium 129 (*)    All other components within normal limits  COMPREHENSIVE METABOLIC PANEL WITH GFR  BRAIN NATRIURETIC PEPTIDE  TROPONIN I (HIGH SENSITIVITY)    EKG: EKG Interpretation Date/Time:  Saturday October 08 2023 23:31:42 EDT Ventricular Rate:  37 PR Interval:    QRS Duration:  154 QT Interval:  541 QTC Calculation: 425 R Axis:   -36  Text Interpretation: Junctional rhythm Right bundle branch block Confirmed by Griselda Norris [phone]) on 10/08/2023 11:32:53 PM  Radiology: [facility] Chest Port 1 View Result Date: 10/08/2023 EXAM: 1 VIEW XRAY OF THE CHEST 10/08/2023 11:37:00 PM COMPARISON: 05/10/2023 CLINICAL HISTORY: SOB. Resp distress FINDINGS: LUNGS AND PLEURA: No frank interstitial edema. Possible trace bilateral pleural effusions. HEART AND MEDIASTINUM: Stable cardiomegaly. BONES AND SOFT TISSUES: No  acute osseous abnormality. Defibrillator pads overlying the right hemithorax. VASCULATURE: Thoracic atherosclerosis. IMPRESSION: 1. Stable cardiomegaly. 2. Possible trace bilateral pleural effusions. No frank interstitial edema. Electronically signed by: Pinkie Pebbles MD 10/08/2023 11:40 PM EDT RP Workstation: [id number]    {Document cardiac monitor, telemetry assessment procedure when appropriate:32947} Procedures   Medications Ordered in the ED  succinylcholine  (ANECTINE ) 200 MG/10ML syringe (has no administration in time range)  etomidate  (AMIDATE ) 2 MG/ML injection (has no administration in time range)  furosemide  (LASIX ) injection 40 mg (40 mg Intravenous Given 10/08/23 2339)  atropine  1 MG/10ML injection (1 mg  Given 10/08/23 2339)  ipratropium-albuterol  (DUONEB) 0.5-2.5 (3) MG/3ML nebulizer solution (  Given by Other 10/08/23 2346)    Clinical Course as of 10/08/23 2359  Sat Oct 08, 2023  2359 EDP Dr. Griselda discussing patient's presentation with cardiology fellow, Dr. Duffy.  [RS]    Clinical Course User Index [RS] Juanette Urizar, Pleasant SAUNDERS, PA-C   {Click here for ABCD2, HEART and other calculators REFRESH Note before signing:1}                              Medical Decision Making 83 y/o male with resp distress.   Bradycardic to low 30s, hypoxic in the 80s on room air, being placed on BiPAP with improvement in respiratory rate.  Oxygen  saturation up to 100% on BiPAP.  Pulmonary exam limited secondary to body habitus.  Patient administered 0.5 mg of atropine  IV for bradycardia.  Additionally IV Lasix  for obvious fluid overload on chest x-ray.  RSI kit at the bedside, GlideScope and crash cart also present at the bedside. EDP Dr. Griselda present at the bedside as well, agrees with plan. Bedside echo by EDP concerning for pericardial effusion, however does not appear consistent with tamponade at this time.    Amount and/or Complexity of Data Reviewed Labs: ordered.    Details: CBC  leukocytosis of 14.7.  I-STAT Chem-8 with creatinine 1.5, sodium 131, potassium 4.7.  VBG with acidosis of pH 7.17, hypercarbic with pCO2 of 74.  Lactic elevated 3.1.  Radiology: ordered.    Details:  Chest x-ray with stable cardiomegaly trace bilateral pleural effusions. ECG/medicine tests:     Details:   EKG with junctional rhythm with bradycardia. Remains junctional after atropine .   Risk Prescription drug management.   ***  {Document critical care time when appropriate  Document review of labs and clinical decision tools ie CHADS2VASC2, etc  Document your independent review of radiology images and any outside records  Document your discussion with family members, caretakers and with consultants  Document social determinants of health affecting pt's care  Document your decision making why or why not admission, treatments were needed:32947:::1}   Final diagnoses:  None    ED Discharge Orders     None

## 2023-10-09 ENCOUNTER — Other Ambulatory Visit: Payer: Self-pay | Admitting: Family Medicine

## 2023-10-09 DIAGNOSIS — J9621 Acute and chronic respiratory failure with hypoxia: Secondary | ICD-10-CM | POA: Diagnosis not present

## 2023-10-09 DIAGNOSIS — F1014 Alcohol abuse with alcohol-induced mood disorder: Secondary | ICD-10-CM | POA: Diagnosis not present

## 2023-10-09 DIAGNOSIS — J9601 Acute respiratory failure with hypoxia: Secondary | ICD-10-CM

## 2023-10-09 DIAGNOSIS — I350 Nonrheumatic aortic (valve) stenosis: Secondary | ICD-10-CM | POA: Diagnosis not present

## 2023-10-09 DIAGNOSIS — Z794 Long term (current) use of insulin: Secondary | ICD-10-CM | POA: Diagnosis not present

## 2023-10-09 DIAGNOSIS — J449 Chronic obstructive pulmonary disease, unspecified: Secondary | ICD-10-CM | POA: Diagnosis not present

## 2023-10-09 DIAGNOSIS — D631 Anemia in chronic kidney disease: Secondary | ICD-10-CM | POA: Diagnosis not present

## 2023-10-09 DIAGNOSIS — Z7984 Long term (current) use of oral hypoglycemic drugs: Secondary | ICD-10-CM | POA: Diagnosis not present

## 2023-10-09 DIAGNOSIS — E1122 Type 2 diabetes mellitus with diabetic chronic kidney disease: Secondary | ICD-10-CM | POA: Diagnosis not present

## 2023-10-09 DIAGNOSIS — E039 Hypothyroidism, unspecified: Secondary | ICD-10-CM | POA: Diagnosis not present

## 2023-10-09 DIAGNOSIS — I5040 Unspecified combined systolic (congestive) and diastolic (congestive) heart failure: Secondary | ICD-10-CM | POA: Diagnosis not present

## 2023-10-09 DIAGNOSIS — J441 Chronic obstructive pulmonary disease with (acute) exacerbation: Secondary | ICD-10-CM | POA: Diagnosis not present

## 2023-10-09 DIAGNOSIS — I5042 Chronic combined systolic (congestive) and diastolic (congestive) heart failure: Secondary | ICD-10-CM | POA: Diagnosis not present

## 2023-10-09 DIAGNOSIS — E785 Hyperlipidemia, unspecified: Secondary | ICD-10-CM | POA: Diagnosis not present

## 2023-10-09 DIAGNOSIS — I480 Paroxysmal atrial fibrillation: Secondary | ICD-10-CM

## 2023-10-09 DIAGNOSIS — N400 Enlarged prostate without lower urinary tract symptoms: Secondary | ICD-10-CM | POA: Diagnosis not present

## 2023-10-09 DIAGNOSIS — Z9981 Dependence on supplemental oxygen: Secondary | ICD-10-CM | POA: Diagnosis not present

## 2023-10-09 DIAGNOSIS — E871 Hypo-osmolality and hyponatremia: Secondary | ICD-10-CM | POA: Diagnosis not present

## 2023-10-09 DIAGNOSIS — E66811 Obesity, class 1: Secondary | ICD-10-CM | POA: Diagnosis not present

## 2023-10-09 DIAGNOSIS — I130 Hypertensive heart and chronic kidney disease with heart failure and stage 1 through stage 4 chronic kidney disease, or unspecified chronic kidney disease: Secondary | ICD-10-CM | POA: Diagnosis not present

## 2023-10-09 DIAGNOSIS — N1832 Chronic kidney disease, stage 3b: Secondary | ICD-10-CM | POA: Diagnosis not present

## 2023-10-09 DIAGNOSIS — J9690 Respiratory failure, unspecified, unspecified whether with hypoxia or hypercapnia: Secondary | ICD-10-CM | POA: Diagnosis not present

## 2023-10-09 DIAGNOSIS — I272 Pulmonary hypertension, unspecified: Secondary | ICD-10-CM | POA: Diagnosis not present

## 2023-10-09 DIAGNOSIS — Z7901 Long term (current) use of anticoagulants: Secondary | ICD-10-CM | POA: Diagnosis not present

## 2023-10-09 DIAGNOSIS — I2510 Atherosclerotic heart disease of native coronary artery without angina pectoris: Secondary | ICD-10-CM | POA: Diagnosis not present

## 2023-10-09 DIAGNOSIS — I959 Hypotension, unspecified: Secondary | ICD-10-CM | POA: Diagnosis not present

## 2023-10-09 DIAGNOSIS — F17210 Nicotine dependence, cigarettes, uncomplicated: Secondary | ICD-10-CM | POA: Diagnosis not present

## 2023-10-09 DIAGNOSIS — R001 Bradycardia, unspecified: Secondary | ICD-10-CM | POA: Diagnosis present

## 2023-10-09 DIAGNOSIS — N179 Acute kidney failure, unspecified: Secondary | ICD-10-CM | POA: Diagnosis not present

## 2023-10-09 DIAGNOSIS — K219 Gastro-esophageal reflux disease without esophagitis: Secondary | ICD-10-CM | POA: Diagnosis not present

## 2023-10-09 LAB — GLUCOSE, CAPILLARY
Glucose-Capillary: 151 mg/dL — ABNORMAL HIGH (ref 70–99)
Glucose-Capillary: 167 mg/dL — ABNORMAL HIGH (ref 70–99)
Glucose-Capillary: 159 mg/dL — ABNORMAL HIGH (ref 70–99)
Glucose-Capillary: 165 mg/dL — ABNORMAL HIGH (ref 70–99)
Glucose-Capillary: 154 mg/dL — ABNORMAL HIGH (ref 70–99)
Glucose-Capillary: 170 mg/dL — ABNORMAL HIGH (ref 70–99)
Glucose-Capillary: 146 mg/dL — ABNORMAL HIGH (ref 70–99)

## 2023-10-09 LAB — CBC
HCT: 45.5 % (ref 39.0–52.0)
Hemoglobin: 14.8 g/dL (ref 13.0–17.0)
MCH: 30.8 pg (ref 26.0–34.0)
MCHC: 32.5 g/dL (ref 30.0–36.0)
MCV: 94.6 fL (ref 80.0–100.0)
Platelets: 192 K/uL (ref 150–400)
RBC: 4.81 MIL/uL (ref 4.22–5.81)
RDW: 13.2 % (ref 11.5–15.5)
WBC: 14.2 K/uL — ABNORMAL HIGH (ref 4.0–10.5)
nRBC: 0 % (ref 0.0–0.2)

## 2023-10-09 LAB — LACTIC ACID, PLASMA: Lactic Acid, Venous: 1.7 mmol/L (ref 0.5–1.9)

## 2023-10-09 LAB — I-STAT VENOUS BLOOD GAS, ED
Acid-base deficit: 1 mmol/L (ref 0.0–2.0)
Bicarbonate: 23.3 mmol/L (ref 20.0–28.0)
Calcium, Ion: 0.91 mmol/L — ABNORMAL LOW (ref 1.15–1.40)
HCT: 42 % (ref 39.0–52.0)
Hemoglobin: 14.3 g/dL (ref 13.0–17.0)
O2 Saturation: 99 %
Potassium: 4.1 mmol/L (ref 3.5–5.1)
Sodium: 129 mmol/L — ABNORMAL LOW (ref 135–145)
TCO2: 24 mmol/L (ref 22–32)
pCO2, Ven: 35.1 mmHg — ABNORMAL LOW (ref 44–60)
pH, Ven: 7.431 — ABNORMAL HIGH (ref 7.25–7.43)
pO2, Ven: 146 mmHg — ABNORMAL HIGH (ref 32–45)

## 2023-10-09 LAB — BASIC METABOLIC PANEL WITH GFR
Anion gap: 13 (ref 5–15)
BUN: 22 mg/dL (ref 8–23)
CO2: 24 mmol/L (ref 22–32)
Calcium: 8.7 mg/dL — ABNORMAL LOW (ref 8.9–10.3)
Chloride: 92 mmol/L — ABNORMAL LOW (ref 98–111)
Creatinine, Ser: 1.52 mg/dL — ABNORMAL HIGH (ref 0.61–1.24)
GFR, Estimated: 45 mL/min — ABNORMAL LOW (ref >60)
Glucose, Bld: 154 mg/dL — ABNORMAL HIGH (ref 70–99)
Potassium: 4.7 mmol/L (ref 3.5–5.1)
Sodium: 129 mmol/L — ABNORMAL LOW (ref 135–145)

## 2023-10-09 LAB — COMPREHENSIVE METABOLIC PANEL WITH GFR
ALT: 27 U/L (ref 0–44)
AST: 28 U/L (ref 15–41)
Albumin: 3.4 g/dL — ABNORMAL LOW (ref 3.5–5.0)
Alkaline Phosphatase: 93 U/L (ref 38–126)
Anion gap: 14 (ref 5–15)
BUN: 19 mg/dL (ref 8–23)
CO2: 25 mmol/L (ref 22–32)
Calcium: 8.8 mg/dL — ABNORMAL LOW (ref 8.9–10.3)
Chloride: 92 mmol/L — ABNORMAL LOW (ref 98–111)
Creatinine, Ser: 1.45 mg/dL — ABNORMAL HIGH (ref 0.61–1.24)
GFR, Estimated: 48 mL/min — ABNORMAL LOW (ref >60)
Glucose, Bld: 167 mg/dL — ABNORMAL HIGH (ref 70–99)
Potassium: 4.4 mmol/L (ref 3.5–5.1)
Sodium: 131 mmol/L — ABNORMAL LOW (ref 135–145)
Total Bilirubin: 0.6 mg/dL (ref 0.0–1.2)
Total Protein: 7.3 g/dL (ref 6.5–8.1)

## 2023-10-09 LAB — PHOSPHORUS: Phosphorus: 4.7 mg/dL — ABNORMAL HIGH (ref 2.5–4.6)

## 2023-10-09 LAB — TROPONIN I (HIGH SENSITIVITY)
Troponin I (High Sensitivity): 20 ng/L — ABNORMAL HIGH (ref <18)
Troponin I (High Sensitivity): 21 ng/L — ABNORMAL HIGH (ref <18)

## 2023-10-09 LAB — I-STAT CG4 LACTIC ACID, ED: Lactic Acid, Venous: 1.7 mmol/L (ref 0.5–1.9)

## 2023-10-09 LAB — HEMOGLOBIN A1C
Hgb A1c MFr Bld: 5.9 % — ABNORMAL HIGH (ref 4.8–5.6)
Mean Plasma Glucose: 122.63 mg/dL

## 2023-10-09 LAB — MAGNESIUM: Magnesium: 2.8 mg/dL — ABNORMAL HIGH (ref 1.7–2.4)

## 2023-10-09 LAB — MRSA NEXT GEN BY PCR, NASAL: MRSA by PCR Next Gen: NOT DETECTED

## 2023-10-09 LAB — TSH: TSH: 5.068 u[IU]/mL — ABNORMAL HIGH (ref 0.350–4.500)

## 2023-10-09 LAB — T4, FREE: Free T4: 1.07 ng/dL (ref 0.61–1.12)

## 2023-10-09 LAB — BRAIN NATRIURETIC PEPTIDE: B Natriuretic Peptide: 686 pg/mL — ABNORMAL HIGH (ref 0.0–100.0)

## 2023-10-09 MED ORDER — POLYETHYLENE GLYCOL 3350 17 G PO PACK: 17.0000 g | PACK | Freq: Every day | ORAL | Status: DC | PRN

## 2023-10-09 MED ORDER — ORAL CARE MOUTH RINSE: 15.0000 mL | OROMUCOSAL | Status: DC | PRN

## 2023-10-09 MED ORDER — METHYLPREDNISOLONE SODIUM SUCC 125 MG IJ SOLR
80.0000 mg | Freq: Every day | INTRAMUSCULAR | Status: DC
  Administered 2023-10-09 – 2023-10-10 (×2): 80 mg via INTRAVENOUS
  Filled 2023-10-09 (×3): qty 2

## 2023-10-09 MED ORDER — DEXTROMETHORPHAN POLISTIREX ER 30 MG/5ML PO SUER
30.0000 mg | Freq: Two times a day (BID) | ORAL | Status: DC | PRN
  Administered 2023-10-11: 30 mg via ORAL
  Filled 2023-10-09 (×2): qty 5

## 2023-10-09 MED ORDER — THIAMINE MONONITRATE 100 MG PO TABS
100.0000 mg | ORAL_TABLET | Freq: Every day | ORAL | Status: DC
Start: 2023-10-09 — End: 2023-10-14
  Administered 2023-10-09 – 2023-10-14 (×6): 100 mg via ORAL
  Filled 2023-10-09 (×6): qty 1

## 2023-10-09 MED ORDER — ADULT MULTIVITAMIN W/MINERALS CH
1.0000 | ORAL_TABLET | Freq: Every day | ORAL | Status: DC
  Administered 2023-10-09 – 2023-10-14 (×6): 1 via ORAL
  Filled 2023-10-09 (×6): qty 1

## 2023-10-09 MED ORDER — CHLORHEXIDINE GLUCONATE CLOTH 2 % EX PADS
6.0000 | MEDICATED_PAD | Freq: Every day | CUTANEOUS | Status: DC
  Administered 2023-10-09 – 2023-10-12 (×4): 6 via TOPICAL

## 2023-10-09 MED ORDER — IPRATROPIUM-ALBUTEROL 0.5-2.5 (3) MG/3ML IN SOLN
3.0000 mL | Freq: Four times a day (QID) | RESPIRATORY_TRACT | Status: DC
  Administered 2023-10-09: 3 mL via RESPIRATORY_TRACT
  Filled 2023-10-09 (×2): qty 3

## 2023-10-09 MED ORDER — METHYLPREDNISOLONE SODIUM SUCC 125 MG IJ SOLR
125.0000 mg | Freq: Once | INTRAMUSCULAR | Status: AC
  Administered 2023-10-09: 125 mg via INTRAVENOUS
  Filled 2023-10-09: qty 2

## 2023-10-09 MED ORDER — ORAL CARE MOUTH RINSE
15.0000 mL | OROMUCOSAL | Status: DC
Start: 2023-10-09 — End: 2023-10-14
  Administered 2023-10-09 – 2023-10-14 (×17): 15 mL via OROMUCOSAL

## 2023-10-09 MED ORDER — LEVOTHYROXINE SODIUM 75 MCG PO TABS
75.0000 ug | ORAL_TABLET | Freq: Every day | ORAL | Status: DC
  Administered 2023-10-09 – 2023-10-14 (×6): 75 ug via ORAL
  Filled 2023-10-09 (×2): qty 3
  Filled 2023-10-09: qty 1
  Filled 2023-10-09: qty 3
  Filled 2023-10-09 (×2): qty 1

## 2023-10-09 MED ORDER — DOPAMINE-DEXTROSE 3.2-5 MG/ML-% IV SOLN
0.0000 ug/kg/min | INTRAVENOUS | Status: DC
  Administered 2023-10-09 – 2023-10-10 (×2): 5 ug/kg/min via INTRAVENOUS
  Filled 2023-10-09 (×2): qty 250

## 2023-10-09 MED ORDER — HYDRALAZINE HCL 20 MG/ML IJ SOLN: 10.0000 mg | INTRAMUSCULAR | Status: DC | PRN

## 2023-10-09 MED ORDER — ARFORMOTEROL TARTRATE 15 MCG/2ML IN NEBU
15.0000 ug | INHALATION_SOLUTION | Freq: Two times a day (BID) | RESPIRATORY_TRACT | Status: DC
  Administered 2023-10-09 – 2023-10-14 (×11): 15 ug via RESPIRATORY_TRACT
  Filled 2023-10-09 (×11): qty 2

## 2023-10-09 MED ORDER — FOLIC ACID 1 MG PO TABS
1.0000 mg | ORAL_TABLET | Freq: Every day | ORAL | Status: DC
  Administered 2023-10-09 – 2023-10-14 (×6): 1 mg via ORAL
  Filled 2023-10-09 (×6): qty 1

## 2023-10-09 MED ORDER — TAMSULOSIN HCL 0.4 MG PO CAPS
0.4000 mg | ORAL_CAPSULE | Freq: Every day | ORAL | Status: DC
  Administered 2023-10-09 – 2023-10-14 (×6): 0.4 mg via ORAL
  Filled 2023-10-09 (×6): qty 1

## 2023-10-09 MED ORDER — ATROPINE SULFATE 1 MG/10ML IJ SOSY
PREFILLED_SYRINGE | INTRAMUSCULAR | Status: AC
  Filled 2023-10-09: qty 10

## 2023-10-09 MED ORDER — ACETAMINOPHEN 325 MG PO TABS
650.0000 mg | ORAL_TABLET | Freq: Four times a day (QID) | ORAL | Status: DC | PRN
  Administered 2023-10-09 – 2023-10-14 (×2): 650 mg via ORAL
  Filled 2023-10-09 (×2): qty 2

## 2023-10-09 MED ORDER — PROCHLORPERAZINE MALEATE 5 MG PO TABS
5.0000 mg | ORAL_TABLET | Freq: Four times a day (QID) | ORAL | Status: DC | PRN
  Administered 2023-10-10: 5 mg via ORAL
  Filled 2023-10-09 (×2): qty 1

## 2023-10-09 MED ORDER — BUDESONIDE 0.5 MG/2ML IN SUSP
0.5000 mg | Freq: Two times a day (BID) | RESPIRATORY_TRACT | Status: DC
  Administered 2023-10-09 – 2023-10-14 (×10): 0.5 mg via RESPIRATORY_TRACT
  Filled 2023-10-09 (×11): qty 2

## 2023-10-09 MED ORDER — INSULIN ASPART 100 UNIT/ML IJ SOLN
1.0000 [IU] | INTRAMUSCULAR | Status: DC
  Administered 2023-10-09: 2 [IU] via SUBCUTANEOUS
  Administered 2023-10-09: 1 [IU] via SUBCUTANEOUS
  Administered 2023-10-09 (×4): 2 [IU] via SUBCUTANEOUS
  Administered 2023-10-10 (×2): 1 [IU] via SUBCUTANEOUS
  Administered 2023-10-10: 2 [IU] via SUBCUTANEOUS
  Administered 2023-10-10 (×2): 1 [IU] via SUBCUTANEOUS
  Administered 2023-10-10: 2 [IU] via SUBCUTANEOUS
  Administered 2023-10-11: 1 [IU] via SUBCUTANEOUS

## 2023-10-09 MED ORDER — MAGNESIUM SULFATE 2 GM/50ML IV SOLN
2.0000 g | Freq: Once | INTRAVENOUS | Status: AC
  Administered 2023-10-09: 2 g via INTRAVENOUS
  Filled 2023-10-09: qty 50

## 2023-10-09 MED ORDER — ATORVASTATIN CALCIUM 40 MG PO TABS
40.0000 mg | ORAL_TABLET | Freq: Every day | ORAL | Status: DC
Start: 2023-10-09 — End: 2023-10-14
  Administered 2023-10-09 – 2023-10-14 (×6): 40 mg via ORAL
  Filled 2023-10-09 (×6): qty 1

## 2023-10-09 MED ORDER — SODIUM CHLORIDE 0.9 % IV SOLN: INTRAVENOUS | Status: DC | PRN

## 2023-10-09 MED ORDER — DOCUSATE SODIUM 100 MG PO CAPS: 100.0000 mg | ORAL_CAPSULE | Freq: Two times a day (BID) | ORAL | Status: DC | PRN

## 2023-10-09 MED ORDER — ATROPINE SULFATE 1 MG/10ML IJ SOSY
0.5000 mg | PREFILLED_SYRINGE | Freq: Once | INTRAMUSCULAR | Status: AC
  Administered 2023-10-09: 0.5 mg via INTRAVENOUS

## 2023-10-09 MED ORDER — REVEFENACIN 175 MCG/3ML IN SOLN
175.0000 ug | Freq: Every day | RESPIRATORY_TRACT | Status: DC
  Administered 2023-10-09 – 2023-10-14 (×6): 175 ug via RESPIRATORY_TRACT
  Filled 2023-10-09 (×5): qty 3

## 2023-10-09 MED ORDER — PANTOPRAZOLE SODIUM 40 MG PO TBEC
40.0000 mg | DELAYED_RELEASE_TABLET | Freq: Every day | ORAL | Status: DC
  Administered 2023-10-09 – 2023-10-14 (×6): 40 mg via ORAL
  Filled 2023-10-09 (×6): qty 1

## 2023-10-09 MED ORDER — IPRATROPIUM-ALBUTEROL 0.5-2.5 (3) MG/3ML IN SOLN
3.0000 mL | RESPIRATORY_TRACT | Status: DC | PRN
  Administered 2023-10-09 – 2023-10-11 (×3): 3 mL via RESPIRATORY_TRACT
  Filled 2023-10-09 (×2): qty 3

## 2023-10-09 MED ORDER — BUDESONIDE 0.25 MG/2ML IN SUSP
0.2500 mg | Freq: Two times a day (BID) | RESPIRATORY_TRACT | Status: DC
  Administered 2023-10-09: 0.25 mg via RESPIRATORY_TRACT
  Filled 2023-10-09: qty 2

## 2023-10-09 MED ORDER — SODIUM CHLORIDE 0.9 % IV SOLN
100.0000 mg | Freq: Two times a day (BID) | INTRAVENOUS | Status: DC
  Administered 2023-10-09 – 2023-10-12 (×7): 100 mg via INTRAVENOUS
  Filled 2023-10-09 (×11): qty 100

## 2023-10-09 MED ORDER — APIXABAN 5 MG PO TABS
5.0000 mg | ORAL_TABLET | Freq: Two times a day (BID) | ORAL | Status: DC
Start: 2023-10-09 — End: 2023-10-09
  Administered 2023-10-09: 5 mg via ORAL
  Filled 2023-10-09: qty 1

## 2023-10-09 NOTE — ED Notes (Addendum)
 Provider made aware of HR, to bedside. Verbal order w/ readback confirmation for 0.5 mg atropine  administration. Atropine  administered, attending MD to bedside.

## 2023-10-09 NOTE — Progress Notes (Signed)
 eLink Physician-Brief Progress Note Patient Name: Kyle Farley DOB: 1940-05-15 MRN: [id number]   Date of Service  10/09/2023  HPI/Events of Note  Patient asking for Tylenol  for generalized body aches, AST 18/ ALT 16. And anti-emetic for nausea  eICU Interventions  Tylenol  and compazine  ordered prn     Intervention Category Minor Interventions: Routine modifications to care plan (e.g. PRN medications for pain, fever)  Damien DASEN Kinta Martis 10/09/2023, 11:48 PM

## 2023-10-09 NOTE — Consult Note (Signed)
 Cardiology Consult Note   Patient ID: Kyle Farley MRN: [id number]; DOB: Nov 10, 1940   Admission date: 10/08/2023  Primary Care Provider: Sherre Clapper, MD Primary Cardiologist: Kyle Leiter, MD   Patient Profile:   Kyle Farley is a 83 y.o. male with a history of paroxysmal atrial fibrillation, nonobstructive coronary artery disease, moderate aortic stenosis, COPD on home oxygen , hypothyroidism, hyperlipidemia  History of Present Illness:   Kyle Farley is currently on a BiPAP and is unable to answer questions.  Per my conversation with the emergency department team, he presented with EMS for respiratory distress. Upon arrival to the emergency department his heart rate was in the 40s and was junctional as blood pressure was 118/84 and he was desaturating and BiPAP was noted. Within an hour of his arrival his blood pressure dropped to 84/52 but would intermittently rise to normal tensive pressures. This fluctuation in his blood pressures occurred quite frequently and was found to be related to when P waves are present on the telemetry his blood pressure is normal versus when p waves are not present he is hypotensive.  Of note, his cardiologist is Dr. Leiter.  He was last seen for a scheduled follow-up in August 2025.  He is currently being managed for his HFpEF to which she is on an SGLT2 and Lasix , nonobstructive CAD, paroxysmal atrial fibrillation and is currently on apixaban , dyslipidemia.  Past Medical History:  Diagnosis Date   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 (HCC) 11/10/2021   Acquired hypothyroidism 11/10/2021   Acute hypoxic respiratory failure (HCC) 04/25/2022   Acute on chronic diastolic heart failure (HCC) 04/25/2022   Acute on chronic respiratory failure with hypoxia (HCC) 04/25/2022   Acute on chronic systolic heart failure (HCC) 11/10/2021   AKI (acute kidney injury) (HCC) 06/08/2022   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/10/2021   Alcohol use  09/12/2020   Anemia of chronic disease 04/25/2022   Arthritis    Atherosclerosis of native arteries of extremities with intermittent claudication, bilateral legs (HCC) 11/10/2021   Bilateral leg edema 02/16/2022   BPH (benign prostatic hyperplasia) 11/10/2021   CHF (congestive heart failure) (HCC)    Chronic diastolic CHF (congestive heart failure) (HCC) 04/25/2022   Chronic hyponatremia 04/25/2022   Chronic respiratory failure with hypoxia (HCC) 05/09/2022   Cigarette smoker 10/21/2014   Class 1 obesity 04/25/2022   Cognitive communication deficit 03/05/2021   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)    Diabetic polyneuropathy associated with type 2 diabetes mellitus (HCC) 08/28/2022   Dyspnea    Dysrhythmia    atrial fibrillation   Elevated glucose 05/27/2022   Encounter for prostate cancer screening 02/16/2022   Encounter for screening for lung cancer 02/16/2022   Essential hypertension 10/21/2014   Femur fracture, right (HCC) 09/12/2020   GERD (gastroesophageal reflux disease)    History of kidney stones    Hyperlipidemia 01/24/2017   Hypertension    Hypo-osmolality and hyponatremia 03/05/2021   Hyponatremia 04/26/2022   Hypotension 08/05/2022   Kidney stones 01/24/2017   Laceration of right eyebrow 06/20/2022   Medication monitoring encounter 12/18/2022   Moderate aortic stenosis 2024   Muscle wasting and atrophy, not elsewhere classified, multiple sites 03/05/2021   Nicotine  dependence, cigarettes, uncomplicated 03/05/2021   Non-seasonal allergic rhinitis due to pollen 06/06/2022   Other abnormalities of gait and mobility 03/05/2021   Other chest pain 12/18/2022   Other lack of coordination 03/05/2021   Oxygen  dependent 08/05/2022   Paroxysmal atrial fibrillation (HCC) 10/21/2014  Pressure injury of skin 09/16/2020   Right ureteral stone 06/08/2022   Sepsis secondary to UTI (HCC) 06/08/2022   SOB (shortness of breath) 05/10/2022   Syncope and collapse  08/05/2022   Tobacco abuse 02/16/2022   Tobacco use disorder 02/16/2022   Type 2 diabetes mellitus (HCC) 06/08/2022   Visit for suture removal 06/15/2022    Past Surgical History:  Procedure Laterality Date   LEFT HEART CATH AND CORONARY ANGIOGRAPHY N/A 09/15/2020   Procedure: LEFT HEART CATH AND CORONARY ANGIOGRAPHY;  Surgeon: Wonda Sharper, MD;  Location: [facility] INVASIVE CV LAB;  Service: Cardiovascular;  Laterality: N/A;   LEG SURGERY Right    steal pin placed in right leg 35 years ago   ORIF FEMUR FRACTURE Right 09/15/2020   Procedure: OPEN REDUCTION INTERNAL FIXATION FEMORAL SHAFT FRACTURE;  Surgeon: Kendal Franky SQUIBB, MD;  Location: [facility] OR;  Service: Orthopedics;  Laterality: Right;     Medications Prior to Admission: Prior to Admission medications   Medication Sig Start Date End Date Taking? Authorizing Provider  albuterol  ([name] ) (2.5 MG/3ML) 0.083% nebulizer solution Take 3 mLs (2.5 mg total) by nebulization every 6 (six) hours as needed for shortness of breath or wheezing. 09/21/23   Kara Dorn NOVAK, MD  Albuterol -Budesonide  (AIRSUPRA ) 90-80 MCG/ACT AERO Inhale 2 puffs into the lungs every 4 (four) hours as needed. 06/21/23   CoxAbigail, MD  amiodarone  (PACERONE ) 200 MG tablet Take 1 tablet (200 mg total) by mouth daily. Patient taking differently: Take 200 mg by mouth 2 (two) times daily. 12/19/22   Kyle Abigail, MD  amLODipine  (NORVASC ) 5 MG tablet Take 1 tablet by mouth once daily 08/09/23   [name], Abigail, MD  apixaban  (ELIQUIS ) 5 MG TABS tablet Take 1 tablet (5 mg total) by mouth 2 (two) times daily. 05/05/23   Kyle Abigail, MD  atorvastatin  (LIPITOR) 40 MG tablet Take 1 tablet by mouth once daily 05/18/23   Carlin Delon BROCKS, NP  capsaicin  topical system (QUTENZA , 4 PATCH,) 8 % Apply 4 patches topically once for 1 dose. Refill every 90 days 09/22/23 09/22/23  Lamount Ethan CROME, DPM  Docusate Sodium  (STOOL SOFTENER LAXATIVE PO) Take 1 tablet by mouth daily.    [provider]   ferrous sulfate  325 (65 FE) MG EC tablet Take 1 tablet by mouth once daily 09/19/23   Milon Cleaves, PA  Finerenone  (KERENDIA ) 20 MG TABS Take 1 tablet (20 mg total) by mouth daily. 07/26/23   Kyle Abigail, MD  Fluticasone -Umeclidin-Vilant (TRELEGY ELLIPTA ) 200-62.5-25 MCG/ACT AEPB Inhale 1 puff into the lungs daily. 09/07/23   CoxAbigail, MD  furosemide  (LASIX ) 40 MG tablet Take 1 tablet (40 mg total) by mouth 2 (two) times daily. 09/07/23   [name], Abigail, MD  JARDIANCE  25 MG TABS tablet TAKE 1 TABLET BY MOUTH ONCE DAILY BEFORE BREAKFAST 09/14/23   [name], Kirsten, MD  levothyroxine  (SYNTHROID ) 75 MCG tablet Take 1 tablet (75 mcg total) by mouth daily. 03/21/23   [name], Abigail, MD  metoprolol  succinate (TOPROL -XL) 50 MG 24 hr tablet TAKE 1 TABLET BY MOUTH ONCE DAILY -  TAKE  WITH  OR  IMMEDIATELY  FOLLOWING  A  MEAL 07/05/23   Munley, Kyle PARAS, MD  montelukast  (SINGULAIR ) 10 MG tablet TAKE 1 TABLET BY MOUTH ONCE DAILY AT  12  NOON 07/13/23   [name], Kirsten, MD  nitroGLYCERIN  (NITROSTAT ) 0.4 MG SL tablet DISSOLVE ONE TABLET UNDER THE TONGUE EVERY 5 MINUTES AS NEEDED FOR CHEST PAIN.  DO NOT EXCEED A TOTAL  OF 3 DOSES IN 15 MINUTES 09/11/23   [name], Kirsten, MD  OXYGEN  Inhale 3 L into the lungs as needed (shortness of breath).    [provider]  pantoprazole  (PROTONIX ) 40 MG tablet Take 1 tablet by mouth once daily 07/10/23   [name], Abigail, MD  predniSONE  (DELTASONE ) 10 MG tablet Take 4 tablets (40 mg total) by mouth daily with breakfast. 09/21/23   Kara Dorn NOVAK, MD  tamsulosin  (FLOMAX ) 0.4 MG CAPS capsule Take 1 capsule by mouth once daily 05/29/23   Kyle Abigail, MD     Allergies:    Allergies  Allergen Reactions   Gabapentin      Edema    Social History:   Social History   Socioeconomic History   Marital status: Widowed    Spouse name: Not on file   Number of children: 3   Years of education: Not on file   Highest education level: High school graduate  Occupational History   Occupation:  retired  Tobacco Use   Smoking status: Every Day    Current packs/day: 1.50    Average packs/day: 1.5 packs/day for 62.0 years (93.0 ttl pk-yrs)    Types: Cigarettes, Cigars   Smokeless tobacco: Never  Vaping Use   Vaping status: Never Used  Substance and Sexual Activity   Alcohol use: Yes    Alcohol/week: 5.0 standard drinks of alcohol    Types: 5 Standard drinks or equivalent per week    Comment: 4-5 beers per day   Drug use: No   Sexual activity: Not Currently  Other Topics Concern   Not on file  Social History Narrative   Not on file   Social Drivers of Health   Financial Resource Strain: Low Risk  (06/21/2023)   Overall Financial Resource Strain (CARDIA)    Difficulty of Paying Living Expenses: Not hard at all  Food Insecurity: No Food Insecurity (07/27/2023)   Hunger Vital Sign    Worried About Running Out of Food in the Last Year: Never true    Ran Out of Food in the Last Year: Never true  Transportation Needs: No Transportation Needs (08/08/2023)   PRAPARE - Administrator, Civil Service (Medical): No    Lack of Transportation (Non-Medical): No  Physical Activity: Inactive (06/21/2023)   Exercise Vital Sign    Days of Exercise per Week: 0 days    Minutes of Exercise per Session: 0 min  Stress: No Stress Concern Present (06/21/2023)   [employer] of Occupational Health - Occupational Stress Questionnaire    Feeling of Stress : Not at all  Social Connections: Moderately Isolated (06/21/2023)   Social Connection and Isolation Panel    Frequency of Communication with Friends and Family: More than three times a week    Frequency of Social Gatherings with Friends and Family: More than three times a week    Attends Religious Services: More than 4 times per year    Active Member of [employer] or Organizations: No    Attends Banker Meetings: Never    Marital Status: Widowed  Intimate Partner Violence: Not At Risk (07/27/2023)   Humiliation, Afraid, Rape,  and Kick questionnaire    Fear of Current or Ex-Partner: No    Emotionally Abused: No    Physically Abused: No    Sexually Abused: No    Family History: Unable to answer  Review of Systems: Unable to answer, Biba  Physical Exam/Data:   Vitals:   10/09/23 0057 10/09/23 0107  10/09/23 0108 10/09/23 0116  BP: (!) 108/49 (!) 69/51 (!) 78/56 (!) 121/35  Pulse: (!) 40 (!) 47 (!) 45 (!) 45  Resp:    16  Temp:      TempSrc:      SpO2:    100%  Weight:       No intake or output data in the 24 hours ending 10/09/23 0123 Filed Weights   10/09/23 0024  Weight: 107.5 kg   Body mass index is 37.12 kg/m.  General: Respiratory distress with respiratory rate in the 30s while on BiPAP HEENT: normal Neck: No JVD Cardiac: Decreased rate and normal rhythm.  Difficult to auscultate for murmurs given BiPAP noises Lungs:  clear to auscultation bilaterally, no wheezing, rhonchi or rales  Abd: soft, nontender, no hepatomegaly  Ext: Nonpitting bilateral chronic edema Musculoskeletal:  No deformities Skin: warm and dry  Psych:  Normal affect   POCUS completed with normal LV and RV EF with IVC less than 2.2 without respiratory changes consistent with RA pressure 8 mmHg.  EKG: Junctional rhythm with a heart rate of 37 bpm  Relevant CV Studies:  Coronary angiogram August 2022 Ost RCA to Prox RCA lesion is 50% stenosed. Prox LAD lesion is 30% stenosed with 0% stenosed side branch in 1st Diag. Prox Cx to Mid Cx lesion is 40% stenosed.   1.  Nonobstructive coronary artery disease with mild nonobstructive plaquing in the LAD, left circumflex, and moderate calcific stenosis of the RCA ostium. 2.  Low normal LVEDP   Laboratory Data: Component     Latest Ref Rng 10/08/2023 10/09/2023  Sodium     135 - 145 mmol/L 131 (L)  129 (L)   Sodium      129 (L)    Sodium      131 (L)    Potassium     3.5 - 5.1 mmol/L 4.4  4.1   Potassium      4.5    Potassium      4.5    Hemoglobin     13.0 - 17.0  g/dL 84.5  85.6   Hemoglobin      17.0    Hemoglobin      17.3 (H)    HCT     39.0 - 52.0 % 49.1  42.0   HCT      50.0    HCT      51.0    pH, Ven     7.25 - 7.43  7.178 (LL)  7.431 (H)   pCO2, Ven     44 - 60 mmHg 74.1 (HH)  35.1 (L)   pO2, Ven     32 - 45 mmHg 58 (H)  146 (H)   Bicarbonate     20.0 - 28.0 mmol/L 27.6  23.3   O2 Saturation     % 81  99   TCO2     22 - 32 mmol/L 30  24   TCO2      26    Acid-base deficit     0.0 - 2.0 mmol/L 3.0 (H)  1.0   Calcium  Ionized     1.15 - 1.40 mmol/L 1.19  0.91 (L)   Calcium  Ionized      1.16    Sample type VENOUS  VENOUS     Radiology/Studies:  DG Chest Port 1 View Result Date: 10/08/2023 EXAM: 1 VIEW XRAY OF THE CHEST 10/08/2023 11:37:00 PM COMPARISON: 05/10/2023 CLINICAL HISTORY: SOB. Resp distress FINDINGS: LUNGS  AND PLEURA: No frank interstitial edema. Possible trace bilateral pleural effusions. HEART AND MEDIASTINUM: Stable cardiomegaly. BONES AND SOFT TISSUES: No acute osseous abnormality. Defibrillator pads overlying the right hemithorax. VASCULATURE: Thoracic atherosclerosis. IMPRESSION: 1. Stable cardiomegaly. 2. Possible trace bilateral pleural effusions. No frank interstitial edema. Electronically signed by: Pinkie Pebbles MD 10/08/2023 11:40 PM EDT RP Workstation: [id number]    Assessment and Plan:   Mr. Cutrone is an 83 year old male who presented to the emergency department with respiratory distress and hypotension. He was respiratory distress appears to be associated with COPD as he was having significant wheezes on exam with no acute respiratory acidosis (pH 7.17, CO2 74).  Regarding his arrhythmia, he has a sinus bradycardia with a right bundle branch block at baseline. In the emergency department his primary rhythm seems to have the same morphology however P wave points are not discernible suggestive of a junctional rhythm. However when the P waves are discernible his blood pressure appears to improved. Metoprolol   succinate 50 mg is on his medication list, however Mr. Cassada is not aware if he is taking the medication. We will do a 48-hour washout in case his rhythm does improve.   - Dopamine  at 5 mcg/kg/min; can increase to up to 10 mcg/kg/min to have most beta 1 effects - Begin norepinephrine if he remains hypotensive despite dopamine  - Begin arterial line to identify if he is perfusing well while in the bradyarrhythmia  - Metoprolol  is currently on his medication list; however he is unclear if he is currently taking it. Hold metoprolol  will do a 48-hour washout -Bedside POCUS-Limited imaging due to difficult windows. Normal biventricular function with IVC less than 2.2 and not changing with respiration consistent with an RA pressure of ~8 mmHg - EP consultation  Merlene Blood, MD MS  Cardiology [facility]

## 2023-10-09 NOTE — Progress Notes (Signed)
 eLink Physician-Brief Progress Note Patient Name: Kyle Farley DOB: [birth year] MRN: [id number]   Date of Service  10/09/2023  HPI/Events of Note  CCM and cardiology note reviewed. Seen on camera. Admitted to ICU for bradycardia needing dopamine  infusion. Med list mentions metoprolol  and amiodarone  which are to be held. Was initially on BIPAP then moved to nasal o2. Has known COPD.   eICU Interventions  Replace electrolytes as needed Cardiology consult reviewed  Dopamine  to continue Current HR is 46-50, BP is in 130s     Intervention Category Major Interventions: Arrhythmia - evaluation and management Evaluation Type: New Patient Evaluation  Kyle Farley 10/09/2023, 2:37 AM

## 2023-10-09 NOTE — ED Notes (Signed)
 Dopamine  not to be titrated per critical care MD

## 2023-10-09 NOTE — Progress Notes (Signed)
 NAME:  Kyle Farley, MRN:  [id number], DOB:  09/02/1940, LOS: 0 ADMISSION DATE:  10/08/2023, CONSULTATION DATE:  10/09/2023 REFERRING MD: Bobette Pleasant SAUNDERS, PA-C, CHIEF COMPLAINT: Bradycardia on Dopamine    History of Present Illness:  An 83 yr old male patient with HTN, DM-2, paroxysmal atrial fibrillation (Amiodarone , Eliquis ), nonobstructive coronary artery disease, moderate aortic stenosis, diastolic CHF (EF 39%, G2DD), BPH, COPD on home oxygen  3 L Kyle Farley, active smoker 2 ppd for 60 yrs, hypothyroidism, dyslipidemia, and GERD, who presented to ED with worsening DOE for 2 days. He have wheezing, seen by pulm 09/21/2023, and finished short course of Prednisone  10 days ago. He is on Trelegy 200/62.5/25, Albuterol , and Airsupra .  His SpO2 80s in ED initially, started on BiPAP 15/8/8, 100% for VBG 7.18/74/58/81%, repeat VBG 7.43/35/146/99%. Heart rate was in the low 30s (Junctional), and he was diaphoretic. Received 2 albuterol  nebs and route with EMS, then Atropine  0.5 mg, Mg 2 g IV, Solu-Medrol  125 mg, and IV Lasix  40 mg x1. Continued to have junctional bradycardia and Dopamine  was started. Normal pressures and route. Denied CP, N/V/D, abd pain, f/c/r, cough, and worsening LL edema.   Denied illicit drug use. Drinks 4 beers daily. Compliant with meds.   Pertinent  Medical History  HTN, DM-2, paroxysmal atrial fibrillation, nonobstructive coronary artery disease, moderate aortic stenosis, diastolic CHF, BPH, COPD on home oxygen  3 L [place], hypothyroidism, dyslipidemia, GERD  Significant Hospital Events: Including procedures, antibiotic start and stop dates in addition to other pertinent events   9/21: ED eval 9/21: admission to ICU on dopamine    Interim History / Subjective:   Patient placed back on bipap this morning for work of breathing Remains bradycardic in te 40s, on dopamine   Objective    Blood pressure (!) 105/94, pulse (!) 41, temperature 98.5 F (36.9 C), temperature source Oral, resp.  rate 15, height 5' 7 (1.702 m), weight 109.3 kg, SpO2 98%.    Vent Mode: PSV;BIPAP FiO2 (%):  [40 %-100 %] 50 % PEEP:  [5 cmH20] 5 cmH20   Intake/Output Summary (Last 24 hours) at 10/09/2023 0858 Last data filed at 10/09/2023 0600 Gross per 24 hour  Intake 101.54 ml  Output --  Net 101.54 ml   Filed Weights   10/09/23 0024 10/09/23 0500  Weight: 107.5 kg 109.3 kg    Examination: General: elderly male, chronically ill appearing. Bipap in place HENT: normal pharynx and oral mucosa Lungs: expiratory wheezing Cardiovascular: bradycardic, S1/S2. No m/g/r Abdomen: no distension or tenderness Extremities: trace edema and changes of chronic venous insufficiency. Symmetrical  Neuro: PERRL, alert, oriented x4, nonfocal    Resolved problem list   Assessment and Plan  Junctional bradycardia probably due to Amiodarone  and metoprolol  Elevated LA -continue dopamine  infusion - Cardiology following, will follow up further recommendations -Echo -Serial LA and trop -Hold Amiodarone  and BB  Paroxysmal atrial fibrillation, nonobstructive coronary artery disease, moderate aortic stenosis, HTN, diastolic CHF (EF 39%, G2DD), dyslipidemia  -Resume statin -Hold CCB -Hold Lasix   COPD with Acute Exacerbation Chronic hypoxic resp failure, HOT 3 L [name] lung 5 mm nodule, seen by the pulm clinic  -start budesonide , brovana  and yupelri  - PRN duonebs - solumedrol 40mg  IV twice daily - doxycycline  twice daily for 7 days - Bipap PRN  Mild hypoNa AKI/CKD-3b -Hold Lasix  -Avoid nephrotoxins  DM-2 -HbA1c -ISS  BPH -Flomax   Active smoker 2 ppd for 60 yrs -counseling to quit   Hypothyroidism -TFT  GERD -PPI  Labs   CBC:  Recent Labs  Lab 10/08/23 2333 10/08/23 2334 10/09/23 0041 10/09/23 0441  WBC  --  14.7*  --  14.2*  NEUTROABS  --  11.3*  --   --   HGB 17.3*  17.0 15.4 14.3 14.8  HCT 51.0  50.0 49.1 42.0 45.5  MCV  --  97.8  --  94.6  PLT  --  210  --  192     Basic Metabolic Panel: Recent Labs  Lab 10/08/23 2333 10/08/23 2334 10/09/23 0041 10/09/23 0441  NA 131*  129* 131* 129* 129*  K 4.5  4.5 4.4 4.1 4.7  CL 94* 92*  --  92*  CO2  --  25  --  24  GLUCOSE 168* 167*  --  154*  BUN 22 19  --  22  CREATININE 1.50* 1.45*  --  1.52*  CALCIUM   --  8.8*  --  8.7*  MG  --   --   --  2.8*  PHOS  --   --   --  4.7*   GFR: Estimated Creatinine Clearance: 44.2 mL/min (A) (by C-G formula based on SCr of 1.52 mg/dL (H)). Recent Labs  Lab 10/08/23 2334 10/08/23 2336 10/09/23 0138 10/09/23 0441  WBC 14.7*  --   --  14.2*  LATICACIDVEN  --  3.1* 1.7 1.7    Liver Function Tests: Recent Labs  Lab 10/08/23 2334  AST 28  ALT 27  ALKPHOS 93  BILITOT 0.6  PROT 7.3  ALBUMIN  3.4*   No results for input(s): LIPASE, AMYLASE in the last 168 hours. No results for input(s): AMMONIA in the last 168 hours.  ABG    Component Value Date/Time   HCO3 23.3 10/09/2023 0041   TCO2 24 10/09/2023 0041   ACIDBASEDEF 1.0 10/09/2023 0041   O2SAT 99 10/09/2023 0041     Coagulation Profile: No results for input(s): INR, PROTIME in the last 168 hours.  Cardiac Enzymes: No results for input(s): CKTOTAL, CKMB, CKMBINDEX, TROPONINI in the last 168 hours.  HbA1C: Hgb A1c MFr Bld  Date/Time Value Ref Range Status  10/09/2023 04:41 AM 5.9 (H) 4.8 - 5.6 % Final    Comment:    (NOTE) Diagnosis of Diabetes The following HbA1c ranges recommended by the American Diabetes Association (ADA) may be used as an aid in the diagnosis of diabetes mellitus.  Hemoglobin             Suggested A1C NGSP%              Diagnosis  <5.7                   Non Diabetic  5.7-6.4                Pre-Diabetic  >6.4                   Diabetic  <7.0                   Glycemic control for                       adults with diabetes.    06/21/2023 10:54 AM 6.0 (H) 4.8 - 5.6 % Final    Comment:             Prediabetes: 5.7 - 6.4           Diabetes: >6.4          Glycemic  control for adults with diabetes: <7.0     CBG: Recent Labs  Lab 10/08/23 2327 10/09/23 0231 10/09/23 0541 10/09/23 0725  GLUCAP 197* 151* 167* 159*       Critical care time: 32 min     Dorn Chill, MD [facility] Office: [phone]   See Amion for personal pager PCCM on call pager [phone] until 7pm. Please call Elink 7p-7a. [phone]

## 2023-10-09 NOTE — Progress Notes (Signed)
  Will hold eliquis  pending possible PPM decision.   Will make Clear liquis after midnight.   Consider heparin  in am if needs further time to decision given CHA2DS2/VASc of at least 6.  Ozell Jodie Passey, PA-C  10/09/2023 9:15 PM

## 2023-10-09 NOTE — H&P (Addendum)
 NAME:  Kyle Farley, MRN:  [id number], DOB:  [birth year], LOS: 0 ADMISSION DATE:  10/08/2023, CONSULTATION DATE:  10/09/2023 REFERRING MD: Bobette Pleasant SAUNDERS, PA-C, CHIEF COMPLAINT: Bradycardia on Dopamine    History of Present Illness:  An 83 yr old male patient with HTN, DM-2, paroxysmal atrial fibrillation (Amiodarone , Eliquis ), nonobstructive coronary artery disease, moderate aortic stenosis, diastolic CHF (EF 39%, G2DD), BPH, COPD on home oxygen  3 L [place], active smoker 2 ppd for 60 yrs, hypothyroidism, dyslipidemia, and GERD, who presented to ED with worsening DOE for 2 days. He have wheezing, seen by pulm 09/21/2023, and finished short course of Prednisone  10 days ago. He is on Trelegy 200/62.5/25, Albuterol , and Airsupra .  His SpO2 80s in ED initially, started on BiPAP 15/8/8, 100% for VBG 7.18/74/58/81%, repeat VBG 7.43/35/146/99%. Heart rate was in the low 30s (Junctional), and he was diaphoretic. Received 2 albuterol  nebs and route with EMS, then Atropine  0.5 mg, Mg 2 g IV, Solu-Medrol  125 mg, and IV Lasix  40 mg x1. Continued to have junctional bradycardia and Dopamine  was started. Normal pressures and route. Denied CP, N/V/D, abd pain, f/c/r, cough, and worsening LL edema.   Denied illicit drug use. Drinks 4 beers daily. Compliant with meds.   Pertinent  Medical History  HTN, DM-2, paroxysmal atrial fibrillation, nonobstructive coronary artery disease, moderate aortic stenosis, diastolic CHF, BPH, COPD on home oxygen  3 L [place], hypothyroidism, dyslipidemia, GERD  Significant Hospital Events: Including procedures, antibiotic start and stop dates in addition to other pertinent events   9/21: ED eval 9/21: admission to ICU on dopamine    Interim History / Subjective:    Objective    Blood pressure 99/79, pulse (!) 45, temperature 98.9 F (37.2 C), temperature source Axillary, resp. rate 19, weight 107.5 kg, SpO2 100%.    FiO2 (%):  [100 %] 100 %  No intake or output data in the 24 hours  ending 10/09/23 0141 Filed Weights   10/09/23 0024  Weight: 107.5 kg    Examination: General: alert, oriented x4, and comfortable. On 3 L [place]. SpO2 100%  HENT: PERL, normal pharynx and oral mucosa. No LNE or thyromegaly. No JVD Lungs: fair symmetrical air entry bilaterally. Basal crackles. No wheezing Cardiovascular: NL S1/S2. No m/g/r Abdomen: no distension or tenderness Extremities: trace edema and changes of chronic venous insufficiency. Symmetrical  Neuro: nonfocal    Resolved problem list   Assessment and Plan  Junctional bradycardia probably due to Amiodarone  and metoprolol  Elevated LA -admit to ICU -Dopamine  drip for HR>45 -Consult cardiology -Echo -TFT -Serial LA and trop -Hold Amiodarone  and BB  Paroxysmal atrial fibrillation, nonobstructive coronary artery disease, moderate aortic stenosis, HTN, diastolic CHF (EF 39%, G2DD), dyslipidemia  -Resume statin -Hold CCB -Hold Lasix   COPD Chronic hypoxic resp failure, HOT 3 L  Left lung 5 mm nodule, seen by the pulm clinic  -Resume home inhalers  Mild hypoNa AKI/CKD-3b -Hold Lasix  -am labs -Avoid nephrotoxins  DM-2 -HbA1c -ISS  BPH -Flomax   Active smoker 2 ppd for 60 yrs -counseling to quit   Hypothyroidism -TFT  GERD -PPI  Labs   CBC: Recent Labs  Lab 10/08/23 2333 10/08/23 2334 10/09/23 0041  WBC  --  14.7*  --   NEUTROABS  --  11.3*  --   HGB 17.3*  17.0 15.4 14.3  HCT 51.0  50.0 49.1 42.0  MCV  --  97.8  --   PLT  --  210  --     Basic Metabolic Panel: Recent  Labs  Lab 10/08/23 2333 10/08/23 2334 10/09/23 0041  NA 131*  129* 131* 129*  K 4.5  4.5 4.4 4.1  CL 94* 92*  --   CO2  --  25  --   GLUCOSE 168* 167*  --   BUN 22 19  --   CREATININE 1.50* 1.45*  --   CALCIUM   --  8.8*  --    GFR: Estimated Creatinine Clearance: 45.9 mL/min (A) (by C-G formula based on SCr of 1.45 mg/dL (H)). Recent Labs  Lab 10/08/23 2334 10/08/23 2336 10/09/23 0138  WBC 14.7*  --   --    LATICACIDVEN  --  3.1* 1.7    Liver Function Tests: Recent Labs  Lab 10/08/23 2334  AST 28  ALT 27  ALKPHOS 93  BILITOT 0.6  PROT 7.3  ALBUMIN  3.4*   No results for input(s): LIPASE, AMYLASE in the last 168 hours. No results for input(s): AMMONIA in the last 168 hours.  ABG    Component Value Date/Time   HCO3 23.3 10/09/2023 0041   TCO2 24 10/09/2023 0041   ACIDBASEDEF 1.0 10/09/2023 0041   O2SAT 99 10/09/2023 0041     Coagulation Profile: No results for input(s): INR, PROTIME in the last 168 hours.  Cardiac Enzymes: No results for input(s): CKTOTAL, CKMB, CKMBINDEX, TROPONINI in the last 168 hours.  HbA1C: Hgb A1c MFr Bld  Date/Time Value Ref Range Status  06/21/2023 10:54 AM 6.0 (H) 4.8 - 5.6 % Final    Comment:             Prediabetes: 5.7 - 6.4          Diabetes: >6.4          Glycemic control for adults with diabetes: <7.0   03/18/2023 12:04 PM 6.1 (H) 4.8 - 5.6 % Final    Comment:             Prediabetes: 5.7 - 6.4          Diabetes: >6.4          Glycemic control for adults with diabetes: <7.0     CBG: Recent Labs  Lab 10/08/23 2327  GLUCAP 197*    Review of Systems:   Review of Systems  Constitutional:  Positive for malaise/fatigue. Negative for chills, diaphoresis, fever and weight loss.  Respiratory:  Positive for shortness of breath and wheezing. Negative for cough, hemoptysis and sputum production.   Cardiovascular:  Positive for leg swelling. Negative for chest pain, palpitations, orthopnea, claudication and PND.  Gastrointestinal:  Negative for abdominal pain, diarrhea, nausea and vomiting.  Genitourinary:  Negative for dysuria.     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), Alcohol use (09/12/2020), Anemia of chronic disease (04/25/2022), Arthritis, Atherosclerosis of native arteries of extremities with intermittent claudication, bilateral legs (HCC) (11/10/2021), Bilateral leg edema (02/16/2022), BPH (benign prostatic hyperplasia) (11/10/2021), CHF (congestive heart failure) (HCC), Chronic diastolic CHF (congestive heart failure) (HCC) (04/25/2022), 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), Chronic respiratory failure with hypoxia (HCC) (05/09/2022), Cigarette smoker (10/21/2014), Class 1 obesity (04/25/2022), Cognitive communication deficit (03/05/2021), 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), Diabetic polyneuropathy associated with  type 2 diabetes mellitus (HCC) (08/28/2022), Dyspnea, Dysrhythmia, Elevated glucose (05/27/2022), Encounter for prostate cancer screening (02/16/2022), Encounter for screening for lung cancer (02/16/2022), Essential hypertension (10/21/2014), Femur fracture, right (HCC) (09/12/2020), GERD (gastroesophageal reflux disease), History of kidney stones, Hyperlipidemia (01/24/2017), Hypertension, Hypo-osmolality and hyponatremia (03/05/2021), Hyponatremia (04/26/2022), Hypotension (08/05/2022), Kidney stones (01/24/2017), Laceration of right eyebrow (06/20/2022), Medication monitoring encounter (12/18/2022), Moderate aortic stenosis (2024), Muscle wasting and atrophy, not elsewhere classified, multiple sites (03/05/2021), Nicotine  dependence, cigarettes, uncomplicated (03/05/2021), 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   Surgical History:   Past Surgical History:  Procedure Laterality Date   LEFT HEART CATH AND CORONARY ANGIOGRAPHY N/A 09/15/2020   Procedure: LEFT HEART CATH AND CORONARY ANGIOGRAPHY;  Surgeon: Wonda Sharper, MD;  Location: [facility] INVASIVE CV LAB;  Service: Cardiovascular;  Laterality: N/A;   LEG SURGERY Right    steal pin placed in right leg 35 years ago   ORIF FEMUR FRACTURE Right 09/15/2020   Procedure: OPEN REDUCTION INTERNAL FIXATION FEMORAL SHAFT FRACTURE;  Surgeon: Kendal Franky SQUIBB, MD;  Location: [facility] OR;  Service: Orthopedics;  Laterality: Right;     Social History:   reports that he has been smoking cigarettes and cigars. He has a 93 pack-year smoking history. He has never used smokeless tobacco. He reports current alcohol use of about 5.0 standard drinks of alcohol per week. He reports that he does not use drugs.   Family History:  His family history includes Breast cancer in his sister; Cancer in his mother; Diabetes in his mother.   Allergies Allergies  Allergen Reactions   Gabapentin      Edema     Home Medications  Prior to Admission medications   Medication Sig Start Date End Date Taking? Authorizing Provider  albuterol  ([name] ) (2.5 MG/3ML) 0.083% nebulizer solution Take 3 mLs (2.5 mg total) by nebulization every 6 (six) hours as needed for shortness of breath or wheezing. 09/21/23   Kara Dorn NOVAK, MD  Albuterol -Budesonide  (AIRSUPRA ) 90-80 MCG/ACT AERO Inhale 2 puffs into the lungs every 4 (four) hours as needed. 06/21/23   CoxAbigail, MD  amiodarone  (PACERONE ) 200 MG tablet Take 1 tablet (200 mg total) by mouth  daily. Patient taking differently: Take 200 mg by mouth 2 (two) times daily. 12/19/22   Sherre Abigail, MD  amLODipine  (NORVASC ) 5 MG tablet Take 1 tablet by mouth once daily 08/09/23   [name], Abigail, MD  apixaban  (ELIQUIS ) 5 MG TABS tablet Take 1 tablet (5 mg total) by mouth 2 (two) times daily. 05/05/23   Sherre Abigail, MD  atorvastatin  (LIPITOR) 40 MG tablet Take 1 tablet by mouth once daily 05/18/23   Carlin Delon BROCKS, NP  capsaicin  topical system (QUTENZA , 4 PATCH,) 8 % Apply 4 patches topically once for 1 dose. Refill every 90 days 09/22/23 09/22/23  Lamount Ethan CROME, DPM  Docusate Sodium  (STOOL SOFTENER LAXATIVE PO) Take 1 tablet by mouth daily.    [provider]  ferrous sulfate  325 (65 FE) MG EC tablet Take 1 tablet by mouth once daily 09/19/23   Milon Cleaves, PA  Finerenone  (KERENDIA ) 20 MG TABS Take  1 tablet (20 mg total) by mouth daily. 07/26/23   Sherre Clapper, MD  Fluticasone -Umeclidin-Vilant (TRELEGY ELLIPTA ) 200-62.5-25 MCG/ACT AEPB Inhale 1 puff into the lungs daily. 09/07/23   CoxClapper, MD  furosemide  (LASIX ) 40 MG tablet Take 1 tablet (40 mg total) by mouth 2 (two) times daily. 09/07/23   CoxClapper, MD  JARDIANCE  25 MG TABS tablet TAKE 1 TABLET BY MOUTH ONCE DAILY BEFORE BREAKFAST 09/14/23   [name], Clapper, MD  levothyroxine  (SYNTHROID ) 75 MCG tablet Take 1 tablet (75 mcg total) by mouth daily. 03/21/23   [name], Clapper, MD  metoprolol  succinate (TOPROL -XL) 50 MG 24 hr tablet TAKE 1 TABLET BY MOUTH ONCE DAILY -  TAKE  WITH  OR  IMMEDIATELY  FOLLOWING  A  MEAL 07/05/23   Monetta Redell PARAS, MD  montelukast  (SINGULAIR ) 10 MG tablet TAKE 1 TABLET BY MOUTH ONCE DAILY AT  12  NOON 07/13/23   [name], Clapper, MD  nitroGLYCERIN  (NITROSTAT ) 0.4 MG SL tablet DISSOLVE ONE TABLET UNDER THE TONGUE EVERY 5 MINUTES AS NEEDED FOR CHEST PAIN.  DO NOT EXCEED A TOTAL OF 3 DOSES IN 15 MINUTES 09/11/23   [name], Kirsten, MD  OXYGEN  Inhale 3 L into the lungs as needed (shortness of breath).    [provider]   pantoprazole  (PROTONIX ) 40 MG tablet Take 1 tablet by mouth once daily 07/10/23   [name], Clapper, MD  predniSONE  (DELTASONE ) 10 MG tablet Take 4 tablets (40 mg total) by mouth daily with breakfast. 09/21/23   Kara Dorn NOVAK, MD  tamsulosin  (FLOMAX ) 0.4 MG CAPS capsule Take 1 capsule by mouth once daily 05/29/23   Sherre Clapper, MD     Critical care time: 35 min     Mancel Ply, MD [facility] Pager: see AMION

## 2023-10-09 NOTE — Progress Notes (Signed)
 Cardiology Consultation   Patient ID: Kyle Farley MRN: [id number]; DOB: 1940-03-22  Admit date: 10/08/2023 Date of Consult: 10/09/2023  PCP:  Sherre Clapper, MD   [facility] Providers Cardiologist:  Redell Leiter, MD  Cardiology APP:  Carlin Delon BROCKS, NP       Patient Profile: Kyle Farley is a 83 y.o. male with a hx of hypertension, diabetes, atrial fibrillation, nonobstructive coronary artery disease, moderate aortic stenosis, diastolic heart failure, COPD, active tobacco use who is being seen 10/09/2023 for the evaluation of bradycardia at the request of Nkiru Osude.  History of Present Illness: Kyle Farley is an 83 year old male with the above past medical history.  He has been short of breath for 2 days prior to admission.  He presented to the emergency room and was noted to be bradycardic, hypotensive, diaphoretic.  He was also acidemic.  He was started on BiPAP with improvement in his respiratory status.  It was thought that he was having a COPD exacerbation.  He was also noted to be in junctional bradycardia.  He is on both amiodarone  and metoprolol .  These have been held.  He was started on dopamine  with heart rates now in the 40s at rest.  The patient does not know his medications, but does state that he takes his medications in the mornings.  He did take all of his medications yesterday morning.  His respiratory status is much improved.  He has no acute complaints at this time.   Past Medical History:  Diagnosis Date   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 (HCC) 11/10/2021   Acquired hypothyroidism 11/10/2021   Acute hypoxic respiratory failure (HCC) 04/25/2022   Acute on chronic diastolic heart failure (HCC) 04/25/2022   Acute on chronic respiratory failure with hypoxia (HCC) 04/25/2022   Acute on chronic systolic heart failure (HCC) 11/10/2021   AKI (acute kidney injury) (HCC) 06/08/2022   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/10/2021    Alcohol use 09/12/2020   Anemia of chronic disease 04/25/2022   Arthritis    Atherosclerosis of native arteries of extremities with intermittent claudication, bilateral legs (HCC) 11/10/2021   Bilateral leg edema 02/16/2022   BPH (benign prostatic hyperplasia) 11/10/2021   CHF (congestive heart failure) (HCC)    Chronic diastolic CHF (congestive heart failure) (HCC) 04/25/2022   Chronic hyponatremia 04/25/2022   Chronic respiratory failure with hypoxia (HCC) 05/09/2022   Cigarette smoker 10/21/2014   Class 1 obesity 04/25/2022   Cognitive communication deficit 03/05/2021   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)    Diabetic polyneuropathy associated with type 2 diabetes mellitus (HCC) 08/28/2022   Dyspnea    Dysrhythmia    atrial fibrillation   Elevated glucose 05/27/2022   Encounter for prostate cancer screening 02/16/2022   Encounter for screening for lung cancer 02/16/2022   Essential hypertension 10/21/2014   Femur fracture, right (HCC) 09/12/2020   GERD (gastroesophageal reflux disease)    History of kidney stones    Hyperlipidemia 01/24/2017   Hypertension    Hypo-osmolality and hyponatremia 03/05/2021   Hyponatremia 04/26/2022   Hypotension 08/05/2022   Kidney stones 01/24/2017   Laceration of right eyebrow 06/20/2022   Medication monitoring encounter 12/18/2022   Moderate aortic stenosis 2024   Muscle wasting and atrophy, not elsewhere classified, multiple sites 03/05/2021   Nicotine  dependence, cigarettes, uncomplicated 03/05/2021   Non-seasonal allergic rhinitis due to pollen 06/06/2022   Other abnormalities of gait and mobility 03/05/2021   Other chest pain 12/18/2022  Other lack of coordination 03/05/2021   Oxygen  dependent 08/05/2022   Paroxysmal atrial fibrillation (HCC) 10/21/2014   Pressure injury of skin 09/16/2020   Right ureteral stone 06/08/2022   Sepsis secondary to UTI (HCC) 06/08/2022   SOB (shortness of breath) 05/10/2022   Syncope and  collapse 08/05/2022   Tobacco abuse 02/16/2022   Tobacco use disorder 02/16/2022   Type 2 diabetes mellitus (HCC) 06/08/2022   Visit for suture removal 06/15/2022    Past Surgical History:  Procedure Laterality Date   LEFT HEART CATH AND CORONARY ANGIOGRAPHY N/A 09/15/2020   Procedure: LEFT HEART CATH AND CORONARY ANGIOGRAPHY;  Surgeon: Wonda Sharper, MD;  Location: [facility] INVASIVE CV LAB;  Service: Cardiovascular;  Laterality: N/A;   LEG SURGERY Right    steal pin placed in right leg 35 years ago   ORIF FEMUR FRACTURE Right 09/15/2020   Procedure: OPEN REDUCTION INTERNAL FIXATION FEMORAL SHAFT FRACTURE;  Surgeon: Kendal Franky SQUIBB, MD;  Location: [facility] OR;  Service: Orthopedics;  Laterality: Right;     Home Medications:  Prior to Admission medications   Medication Sig Start Date End Date Taking? Authorizing Provider  albuterol  ([name] ) (2.5 MG/3ML) 0.083% nebulizer solution Take 3 mLs (2.5 mg total) by nebulization every 6 (six) hours as needed for shortness of breath or wheezing. 09/21/23  Yes Kara Dorn NOVAK, MD  Albuterol -Budesonide  (AIRSUPRA ) 90-80 MCG/ACT AERO Inhale 2 puffs into the lungs every 4 (four) hours as needed. Patient taking differently: Inhale 2 puffs into the lungs every 4 (four) hours as needed (wheezing / sob). 06/21/23  Yes [name], Kirsten, MD  amiodarone  (PACERONE ) 200 MG tablet Take 200 mg by mouth 2 (two) times daily.   Yes [provider]  amLODipine  (NORVASC ) 5 MG tablet Take 1 tablet by mouth once daily 08/09/23  Yes [name], Kirsten, MD  apixaban  (ELIQUIS ) 5 MG TABS tablet Take 1 tablet (5 mg total) by mouth 2 (two) times daily. 05/05/23  Yes [name], Kirsten, MD  atorvastatin  (LIPITOR) 40 MG tablet Take 1 tablet by mouth once daily 05/18/23  Yes Carlin Delon BROCKS, NP  ferrous sulfate  325 (65 FE) MG EC tablet Take 325 mg by mouth daily with breakfast.   Yes [provider]  Fluticasone -Umeclidin-Vilant (TRELEGY ELLIPTA ) 200-62.5-25 MCG/ACT AEPB Inhale 1 puff into  the lungs daily. 09/07/23  Yes [name], Kirsten, MD  furosemide  (LASIX ) 40 MG tablet Take 1 tablet (40 mg total) by mouth 2 (two) times daily. 09/07/23  Yes [name], Kirsten, MD  JARDIANCE  25 MG TABS tablet TAKE 1 TABLET BY MOUTH ONCE DAILY BEFORE BREAKFAST 09/14/23  Yes [name], Kirsten, MD  levothyroxine  (SYNTHROID ) 75 MCG tablet Take 1 tablet (75 mcg total) by mouth daily. 03/21/23  Yes [name], Kirsten, MD  metoprolol  succinate (TOPROL -XL) 50 MG 24 hr tablet Take 50 mg by mouth daily. Take with or immediately following a meal.   Yes [provider]  montelukast  (SINGULAIR ) 10 MG tablet TAKE 1 TABLET BY MOUTH ONCE DAILY AT  12  NOON 07/13/23  Yes [name], Kirsten, MD  nitroGLYCERIN  (NITROSTAT ) 0.4 MG SL tablet DISSOLVE ONE TABLET UNDER THE TONGUE EVERY 5 MINUTES AS NEEDED FOR CHEST PAIN.  DO NOT EXCEED A TOTAL OF 3 DOSES IN 15 MINUTES 09/11/23  Yes [name], Kirsten, MD  OXYGEN  Inhale 3 L into the lungs as needed (shortness of breath).   Yes [provider]  pantoprazole  (PROTONIX ) 40 MG tablet Take 1 tablet by mouth once daily 07/10/23  Yes [name], Abigail, MD  Scheduled Meds:  apixaban   5 mg Oral BID   arformoterol   15 mcg Nebulization BID   atorvastatin   40 mg Oral Daily   budesonide  (PULMICORT ) nebulizer solution  0.5 mg Nebulization BID   Chlorhexidine  Gluconate Cloth  6 each Topical Daily   folic acid   1 mg Oral Daily   insulin  aspart  1-3 Units Subcutaneous Q4H   levothyroxine   75 mcg Oral Q0600   methylPREDNISolone  (SOLU-MEDROL ) injection  80 mg Intravenous Daily   multivitamin with minerals  1 tablet Oral Daily   mouth rinse  15 mL Mouth Rinse 4 times per day   pantoprazole   40 mg Oral Daily   revefenacin   175 mcg Nebulization Daily   tamsulosin   0.4 mg Oral Daily   thiamine   100 mg Oral Daily   Continuous Infusions:  DOPamine  5 mcg/kg/min (10/09/23 0600)   doxycycline  (VIBRAMYCIN ) IV     PRN Meds: docusate sodium , hydrALAZINE , ipratropium-albuterol , mouth rinse, polyethylene  glycol  Allergies:    Allergies  Allergen Reactions   Gabapentin  Other (See Comments)    Edema    Social History:   Social History   Socioeconomic History   Marital status: Widowed    Spouse name: Not on file   Number of children: 3   Years of education: Not on file   Highest education level: High school graduate  Occupational History   Occupation: retired  Tobacco Use   Smoking status: Every Day    Current packs/day: 1.50    Average packs/day: 1.5 packs/day for 62.0 years (93.0 ttl pk-yrs)    Types: Cigarettes, Cigars   Smokeless tobacco: Never  Vaping Use   Vaping status: Never Used  Substance and Sexual Activity   Alcohol use: Yes    Alcohol/week: 5.0 standard drinks of alcohol    Types: 5 Standard drinks or equivalent per week    Comment: 4-5 beers per day   Drug use: No   Sexual activity: Not Currently  Other Topics Concern   Not on file  Social History Narrative   Not on file   Social Drivers of Health   Financial Resource Strain: Low Risk  (06/21/2023)   Overall Financial Resource Strain (CARDIA)    Difficulty of Paying Living Expenses: Not hard at all  Food Insecurity: No Food Insecurity (07/27/2023)   Hunger Vital Sign    Worried About Running Out of Food in the Last Year: Never true    Ran Out of Food in the Last Year: Never true  Transportation Needs: No Transportation Needs (08/08/2023)   PRAPARE - Administrator, Civil Service (Medical): No    Lack of Transportation (Non-Medical): No  Physical Activity: Inactive (06/21/2023)   Exercise Vital Sign    Days of Exercise per Week: 0 days    Minutes of Exercise per Session: 0 min  Stress: No Stress Concern Present (06/21/2023)   [employer] of Occupational Health - Occupational Stress Questionnaire    Feeling of Stress : Not at all  Social Connections: Moderately Isolated (06/21/2023)   Social Connection and Isolation Panel    Frequency of Communication with Friends and Family: More than  three times a week    Frequency of Social Gatherings with Friends and Family: More than three times a week    Attends Religious Services: More than 4 times per year    Active Member of [employer] or Organizations: No    Attends Banker Meetings: Never    Marital Status: Widowed  Intimate Partner Violence: Not At Risk (07/27/2023)   Humiliation, Afraid, Rape, and Kick questionnaire    Fear of Current or Ex-Partner: No    Emotionally Abused: No    Physically Abused: No    Sexually Abused: No    Family History:    Family History  Problem Relation Age of Onset   Cancer Mother    Diabetes Mother    Breast cancer Sister      ROS:  Please see the history of present illness.   All other ROS reviewed and negative.     Physical Exam/Data: Vitals:   10/09/23 0426 10/09/23 0500 10/09/23 0600 10/09/23 0723  BP: 122/72 98/78 (!) 105/94   Pulse: (!) 43 (!) 43 (!) 41   Resp: 17 (!) 26 15   Temp:    98.5 F (36.9 C)  TempSrc:    Oral  SpO2: 94% 94% 98%   Weight:  109.3 kg    Height:        Intake/Output Summary (Last 24 hours) at 10/09/2023 1111 Last data filed at 10/09/2023 0600 Gross per 24 hour  Intake 101.54 ml  Output --  Net 101.54 ml      10/09/2023    5:00 AM 10/09/2023   12:24 AM 09/21/2023    8:27 AM  Last 3 Weights  Weight (lbs) 240 lb 15.4 oz 236 lb 15.9 oz 237 lb  Weight (kg) 109.3 kg 107.5 kg 107.502 kg     Body mass index is 37.74 kg/m.  General:  Well nourished, well developed, in no acute distress HEENT: normal Neck: no JVD Vascular: No carotid bruits; Distal pulses 2+ bilaterally Cardiac: Bradycardic Lungs: Faint breath sounds, audible wheezing Abd: soft, nontender, no hepatomegaly  Ext: no edema Musculoskeletal:  No deformities, BUE and BLE strength normal and equal Skin: warm and dry  Neuro:  CNs 2-12 intact, no focal abnormalities noted Psych:  Normal affect   EKG:  The EKG was personally reviewed and demonstrates: Junctional bradycardia,  right bundle branch block Telemetry:  Telemetry was personally reviewed and demonstrates: Sinus bradycardia  Relevant CV Studies: Echo pending  Laboratory Data: High Sensitivity Troponin:   Recent Labs  Lab 10/08/23 2334 10/09/23 0132  TROPONINIHS 20* 21*     Chemistry Recent Labs  Lab 10/08/23 2333 10/08/23 2334 10/09/23 0041 10/09/23 0441  NA 131*  129* 131* 129* 129*  K 4.5  4.5 4.4 4.1 4.7  CL 94* 92*  --  92*  CO2  --  25  --  24  GLUCOSE 168* 167*  --  154*  BUN 22 19  --  22  CREATININE 1.50* 1.45*  --  1.52*  CALCIUM   --  8.8*  --  8.7*  MG  --   --   --  2.8*  GFRNONAA  --  48*  --  45*  ANIONGAP  --  14  --  13    Recent Labs  Lab 10/08/23 2334  PROT 7.3  ALBUMIN  3.4*  AST 28  ALT 27  ALKPHOS 93  BILITOT 0.6   Lipids No results for input(s): CHOL, TRIG, HDL, LABVLDL, LDLCALC, CHOLHDL in the last 168 hours.  Hematology Recent Labs  Lab 10/08/23 2334 10/09/23 0041 10/09/23 0441  WBC 14.7*  --  14.2*  RBC 5.02  --  4.81  HGB 15.4 14.3 14.8  HCT 49.1 42.0 45.5  MCV 97.8  --  94.6  MCH 30.7  --  30.8  MCHC 31.4  --  32.5  RDW 13.4  --  13.2  PLT 210  --  192   Thyroid   Recent Labs  Lab 10/09/23 0132  TSH 5.068*  FREET4 1.07    BNP Recent Labs  Lab 10/08/23 2334  BNP 686.0*    DDimer No results for input(s): DDIMER in the last 168 hours.  Radiology/Studies:  DG Chest Port 1 View Result Date: 10/08/2023 EXAM: 1 VIEW XRAY OF THE CHEST 10/08/2023 11:37:00 PM COMPARISON: 05/10/2023 CLINICAL HISTORY: SOB. Resp distress FINDINGS: LUNGS AND PLEURA: No frank interstitial edema. Possible trace bilateral pleural effusions. HEART AND MEDIASTINUM: Stable cardiomegaly. BONES AND SOFT TISSUES: No acute osseous abnormality. Defibrillator pads overlying the right hemithorax. VASCULATURE: Thoracic atherosclerosis. IMPRESSION: 1. Stable cardiomegaly. 2. Possible trace bilateral pleural effusions. No frank interstitial edema. Electronically  signed by: Pinkie Pebbles MD 10/08/2023 11:40 PM EDT RP Workstation: [id number]     Assessment and Plan: Junctional bradycardia: Patient's metoprolol  and amiodarone  have both been held.  He is in sinus rhythm.  He is on dopamine  5 mcg/kg/min.  As his metoprolol  is now wearing off, would try to wean his dopamine .  If dopamine  is weaned and his heart rates are sinus, in the 40s, no further intervention is needed if he can ambulate without issue.  If he is unable to ambulate and his shortness of breath is worse, may require pacemaker implant. Paroxysmal atrial fibrillation: Was previously on amiodarone .  Amiodarone  has been held.  He is not a good candidate for invasive procedures.  May be able to restart amiodarone  at a lower dose pending heart rhythm evaluation. COPD: Has chronic hypoxic respiratory failure.  Continue management per critical care   Risk Assessment/Risk Scores:         CHA2DS2-VASc Score = 5   This indicates a 7.2% annual risk of stroke. The patient's score is based upon: CHF History: 0 HTN History: 1 Diabetes History: 1 Stroke History: 0 Vascular Disease History: 1 Age Score: 2 Gender Score: 0        For questions or updates, please contact [facility] Please consult [email or web address] for contact info under      Signed, Jourdyn Hasler Gladis Norton, MD  10/09/2023 11:11 AM

## 2023-10-10 ENCOUNTER — Ambulatory Visit: Admitting: Podiatry

## 2023-10-10 ENCOUNTER — Inpatient Hospital Stay (HOSPITAL_COMMUNITY)

## 2023-10-10 DIAGNOSIS — N179 Acute kidney failure, unspecified: Secondary | ICD-10-CM | POA: Diagnosis not present

## 2023-10-10 DIAGNOSIS — I5040 Unspecified combined systolic (congestive) and diastolic (congestive) heart failure: Secondary | ICD-10-CM | POA: Diagnosis not present

## 2023-10-10 DIAGNOSIS — J441 Chronic obstructive pulmonary disease with (acute) exacerbation: Secondary | ICD-10-CM | POA: Diagnosis not present

## 2023-10-10 DIAGNOSIS — I480 Paroxysmal atrial fibrillation: Secondary | ICD-10-CM | POA: Diagnosis not present

## 2023-10-10 DIAGNOSIS — R001 Bradycardia, unspecified: Secondary | ICD-10-CM | POA: Diagnosis not present

## 2023-10-10 LAB — BASIC METABOLIC PANEL WITH GFR
Anion gap: 12 (ref 5–15)
BUN: 32 mg/dL — ABNORMAL HIGH (ref 8–23)
CO2: 26 mmol/L (ref 22–32)
Calcium: 8.9 mg/dL (ref 8.9–10.3)
Chloride: 93 mmol/L — ABNORMAL LOW (ref 98–111)
Creatinine, Ser: 1.53 mg/dL — ABNORMAL HIGH (ref 0.61–1.24)
GFR, Estimated: 45 mL/min — ABNORMAL LOW (ref >60)
Glucose, Bld: 132 mg/dL — ABNORMAL HIGH (ref 70–99)
Potassium: 4.5 mmol/L (ref 3.5–5.1)
Sodium: 131 mmol/L — ABNORMAL LOW (ref 135–145)

## 2023-10-10 LAB — CBC
HCT: 45.8 % (ref 39.0–52.0)
Hemoglobin: 14.5 g/dL (ref 13.0–17.0)
MCH: 30.7 pg (ref 26.0–34.0)
MCHC: 31.7 g/dL (ref 30.0–36.0)
MCV: 96.8 fL (ref 80.0–100.0)
Platelets: 194 K/uL (ref 150–400)
RBC: 4.73 MIL/uL (ref 4.22–5.81)
RDW: 13.4 % (ref 11.5–15.5)
WBC: 15.4 K/uL — ABNORMAL HIGH (ref 4.0–10.5)
nRBC: 0 % (ref 0.0–0.2)

## 2023-10-10 LAB — ECHOCARDIOGRAM COMPLETE
AR max vel: 2.04 cm2
AV Area VTI: 2.45 cm2
AV Area mean vel: 2.32 cm2
AV Mean grad: 16 mmHg
AV Peak grad: 31.8 mmHg
Ao pk vel: 2.82 m/s
Area-P 1/2: 3.65 cm2
Calc EF: 61.9 %
Height: 67 in
MV VTI: 2.68 cm2
S' Lateral: 4.3 cm
Single Plane A2C EF: 62.1 %
Single Plane A4C EF: 60.7 %
Weight: 3887.15 [oz_av]

## 2023-10-10 LAB — GLUCOSE, CAPILLARY
Glucose-Capillary: 148 mg/dL — ABNORMAL HIGH (ref 70–99)
Glucose-Capillary: 147 mg/dL — ABNORMAL HIGH (ref 70–99)
Glucose-Capillary: 131 mg/dL — ABNORMAL HIGH (ref 70–99)
Glucose-Capillary: 131 mg/dL — ABNORMAL HIGH (ref 70–99)
Glucose-Capillary: 172 mg/dL — ABNORMAL HIGH (ref 70–99)
Glucose-Capillary: 172 mg/dL — ABNORMAL HIGH (ref 70–99)

## 2023-10-10 LAB — MAGNESIUM: Magnesium: 2.8 mg/dL — ABNORMAL HIGH (ref 1.7–2.4)

## 2023-10-10 LAB — HEPARIN LEVEL (UNFRACTIONATED): Heparin Unfractionated: >1.1 [IU]/mL — ABNORMAL HIGH (ref 0.30–0.70)

## 2023-10-10 LAB — APTT: aPTT: 38 s — ABNORMAL HIGH (ref 24–36)

## 2023-10-10 MED ORDER — PERFLUTREN LIPID MICROSPHERE
1.0000 mL | INTRAVENOUS | Status: AC | PRN
  Administered 2023-10-10: 2 mL via INTRAVENOUS

## 2023-10-10 MED ORDER — GUAIFENESIN ER 600 MG PO TB12
600.0000 mg | ORAL_TABLET | Freq: Two times a day (BID) | ORAL | Status: DC
  Administered 2023-10-10 (×2): 600 mg via ORAL
  Filled 2023-10-10 (×3): qty 1

## 2023-10-10 MED ORDER — HEPARIN (PORCINE) 25000 UT/250ML-% IV SOLN
1750.0000 [IU]/h | INTRAVENOUS | Status: DC
  Administered 2023-10-10: 1450 [IU]/h via INTRAVENOUS
  Filled 2023-10-10 (×2): qty 250

## 2023-10-10 MED ORDER — FUROSEMIDE 40 MG PO TABS
40.0000 mg | ORAL_TABLET | Freq: Once | ORAL | Status: AC
  Administered 2023-10-10: 40 mg via ORAL
  Filled 2023-10-10: qty 1

## 2023-10-10 NOTE — Progress Notes (Signed)
  Patient Name: Kyle Farley Date of Encounter: 10/10/2023  Primary Cardiologist: Redell Leiter, MD Electrophysiologist: None  Interval Summary   No new complaints.   Getting breathing treatment.   Vital Signs    Vitals:   10/10/23 0600 10/10/23 0700 10/10/23 0722 10/10/23 0800  BP: 125/62 (!) 124/55    Pulse: (!) 56 (!) 58 62 (!) 59  Resp: 13 (!) 21 (!) 22 15  Temp:   97.9 F (36.6 C)   TempSrc:   Oral   SpO2: 92% 95% 92% 95%  Weight:      Height:        Intake/Output Summary (Last 24 hours) at 10/10/2023 0802 Last data filed at 10/10/2023 0800 Gross per 24 hour  Intake 796.54 ml  Output 2200 ml  Net -1403.46 ml   Filed Weights   10/09/23 0024 10/09/23 0500 10/10/23 0500  Weight: 107.5 kg 109.3 kg 110.2 kg    Physical Exam    GEN- NAD, Alert and oriented  Lungs- Clear to ausculation bilaterally, normal work of breathing Cardiac- Regular rate and rhythm, no murmurs, rubs or gallops GI- soft, NT, ND, + BS Extremities- no clubbing or cyanosis. No edema  Telemetry    Sinus bradycardia 50-60s (personally reviewed)  Hospital Course    Nichols TONEY DIFATTA is a 83 y.o. male with a hx of hypertension, diabetes, atrial fibrillation, nonobstructive coronary artery disease, moderate aortic stenosis, diastolic heart failure, COPD, active tobacco use who is being seen 10/09/2023 for the evaluation of bradycardia at the request of Nkiru Osude.   Assessment & Plan     Junctional bradycardia On arrival Improved now with washout of metoprolol  (last dose Saturday am.  Hold amiodarone .  Remains on dopamine  5 mcg/kg/min will wean this morning.   If dopamine  is weaned and his heart rates are sinus, in the 40s, no further intervention is needed if he can ambulate without issue.  If he is unable to ambulate and his shortness of breath is worse, may require pacemaker implant.  Paroxysmal AF Hold amiodarone  Avoid AV nodal agents Hold OAC.  May bridge with heparin  pending  disposition.   COPD On 3 L O2 chronically.  Continue management per critical care   As above, if rates remain stable with weaning of dopamine , would not proceed with pacing at this time.   For questions or updates, please contact [facility] Please consult [email or web address] for contact info under     Signed, Ozell Prentice Passey, PA-C  10/10/2023, 8:02 AM

## 2023-10-10 NOTE — Progress Notes (Addendum)
 PHARMACY - ANTICOAGULATION CONSULT NOTE  Pharmacy Consult for Heparin  Indication: atrial fibrillation  Allergies  Allergen Reactions   Gabapentin  Other (See Comments)    Edema    Patient Measurements: Height: 5' 7 (170.2 cm) Weight: 110.2 kg (242 lb 15.2 oz) IBW/kg (Calculated) : 66.1  Vital Signs: Temp: 98.1 F (36.7 C) (09/22 1110) Temp Source: Oral (09/22 1110) BP: 132/65 (09/22 1445) Pulse Rate: 51 (09/22 1445)  Labs: Recent Labs    10/08/23 2334 10/09/23 0041 10/09/23 0132 10/09/23 0441 10/10/23 0944  HGB 15.4 14.3  --  14.8 14.5  HCT 49.1 42.0  --  45.5 45.8  PLT 210  --   --  192 194  CREATININE 1.45*  --   --  1.52* 1.53*  TROPONINIHS 20*  --  21*  --   --     Estimated Creatinine Clearance: 44.1 mL/min (A) (by C-G formula based on SCr of 1.53 mg/dL (H)).   Medical History: Past Medical History:  Diagnosis Date   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 11/10/2021   Acquired hypothyroidism 11/10/2021   Acute hypoxic respiratory failure (HCC) 04/25/2022   Acute on chronic diastolic heart failure (HCC) 04/25/2022   Acute on chronic respiratory failure with hypoxia (HCC) 04/25/2022   Acute on chronic systolic heart failure (HCC) 11/10/2021   AKI (acute kidney injury) (HCC) 06/08/2022   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/10/2021   Alcohol use 09/12/2020   Anemia of chronic disease 04/25/2022   Arthritis    Atherosclerosis of native arteries of extremities with intermittent claudication, bilateral legs (HCC) 11/10/2021   Bilateral leg edema 02/16/2022   BPH (benign prostatic hyperplasia) 11/10/2021   CHF (congestive heart failure) (HCC)    Chronic diastolic CHF (congestive heart failure) (HCC) 04/25/2022   Chronic hyponatremia 04/25/2022   Chronic respiratory failure with hypoxia (HCC) 05/09/2022   Cigarette smoker 10/21/2014   Class 1 obesity 04/25/2022   Cognitive communication deficit 03/05/2021   COPD on long-term  inhaled steroid therapy (HCC)    Diabetic polyneuropathy associated with type 2 diabetes mellitus (HCC) 08/28/2022   Dyspnea    Dysrhythmia    atrial fibrillation   Elevated glucose 05/27/2022   Encounter for prostate cancer screening 02/16/2022   Encounter for screening for lung cancer 02/16/2022   Essential hypertension 10/21/2014   Femur fracture, right (HCC) 09/12/2020   GERD (gastroesophageal reflux disease)    History of kidney stones    Hyperlipidemia 01/24/2017   Hypertension    Hypo-osmolality and hyponatremia 03/05/2021   Hyponatremia 04/26/2022   Hypotension 08/05/2022   Kidney stones 01/24/2017   Laceration of right eyebrow 06/20/2022   Medication monitoring encounter 12/18/2022   Moderate aortic stenosis 2024   Muscle wasting and atrophy, not elsewhere classified, multiple sites 03/05/2021   Nicotine  dependence, cigarettes, uncomplicated 03/05/2021   Non-seasonal allergic rhinitis due to pollen 06/06/2022   Other abnormalities of gait and mobility 03/05/2021   Other chest pain 12/18/2022   Other lack of coordination 03/05/2021   Oxygen  dependent 08/05/2022   Paroxysmal atrial fibrillation (HCC) 10/21/2014   Pressure injury of skin 09/16/2020   Right ureteral stone 06/08/2022   Sepsis secondary to UTI (HCC) 06/08/2022   SOB (shortness of breath) 05/10/2022   Syncope and collapse 08/05/2022   Tobacco abuse 02/16/2022   Tobacco use disorder 02/16/2022   Type 2 diabetes mellitus (HCC) 06/08/2022   Visit for suture removal 06/15/2022    Medications:  Medications Prior to Admission  Medication Sig Dispense Refill  Last Dose/Taking   albuterol  ([name] ) (2.5 MG/3ML) 0.083% nebulizer solution Take 3 mLs (2.5 mg total) by nebulization every 6 (six) hours as needed for shortness of breath or wheezing. 120 mL 11 10/08/2023   Albuterol -Budesonide  (AIRSUPRA ) 90-80 MCG/ACT AERO Inhale 2 puffs into the lungs every 4 (four) hours as needed. (Patient taking differently: Inhale  2 puffs into the lungs every 4 (four) hours as needed (wheezing / sob).) 10.7 g 5 10/08/2023   amiodarone  (PACERONE ) 200 MG tablet Take 200 mg by mouth 2 (two) times daily.   10/08/2023 Morning   amLODipine  (NORVASC ) 5 MG tablet Take 1 tablet by mouth once daily 90 tablet 0 10/08/2023 Morning   apixaban  (ELIQUIS ) 5 MG TABS tablet Take 1 tablet (5 mg total) by mouth 2 (two) times daily. 180 tablet 3 10/08/2023 at  4:30 AM   atorvastatin  (LIPITOR) 40 MG tablet Take 1 tablet by mouth once daily 90 tablet 1 10/08/2023 Morning   ferrous sulfate  325 (65 FE) MG EC tablet Take 325 mg by mouth daily with breakfast.   10/08/2023   Fluticasone -Umeclidin-Vilant (TRELEGY ELLIPTA ) 200-62.5-25 MCG/ACT AEPB Inhale 1 puff into the lungs daily. 180 each 3 10/08/2023 Morning   furosemide  (LASIX ) 40 MG tablet Take 1 tablet (40 mg total) by mouth 2 (two) times daily. 60 tablet 3 10/08/2023 Morning   JARDIANCE  25 MG TABS tablet TAKE 1 TABLET BY MOUTH ONCE DAILY BEFORE BREAKFAST 90 tablet 0 10/08/2023   levothyroxine  (SYNTHROID ) 75 MCG tablet Take 1 tablet (75 mcg total) by mouth daily. 90 tablet 3 10/08/2023 at  4:30 AM   metoprolol  succinate (TOPROL -XL) 50 MG 24 hr tablet Take 50 mg by mouth daily. Take with or immediately following a meal.   10/08/2023 Morning   nitroGLYCERIN  (NITROSTAT ) 0.4 MG SL tablet DISSOLVE ONE TABLET UNDER THE TONGUE EVERY 5 MINUTES AS NEEDED FOR CHEST PAIN.  DO NOT EXCEED A TOTAL OF 3 DOSES IN 15 MINUTES 50 tablet 0 Unknown   OXYGEN  Inhale 3 L into the lungs as needed (shortness of breath).   10/08/2023   pantoprazole  (PROTONIX ) 40 MG tablet Take 1 tablet by mouth once daily 90 tablet 1 10/08/2023 Morning   montelukast  (SINGULAIR ) 10 MG tablet TAKE 1 TABLET BY MOUTH ONCE DAILY AT 12 NOON 90 tablet 3    Scheduled:   arformoterol   15 mcg Nebulization BID   atorvastatin   40 mg Oral Daily   budesonide  (PULMICORT ) nebulizer solution  0.5 mg Nebulization BID   Chlorhexidine  Gluconate Cloth  6 each Topical  Daily   folic acid   1 mg Oral Daily   guaiFENesin   600 mg Oral BID   insulin  aspart  1-3 Units Subcutaneous Q4H   levothyroxine   75 mcg Oral Q0600   methylPREDNISolone  (SOLU-MEDROL ) injection  80 mg Intravenous Daily   multivitamin with minerals  1 tablet Oral Daily   mouth rinse  15 mL Mouth Rinse 4 times per day   pantoprazole   40 mg Oral Daily   revefenacin   175 mcg Nebulization Daily   tamsulosin   0.4 mg Oral Daily   thiamine   100 mg Oral Daily   Infusions:   doxycycline  (VIBRAMYCIN ) IV Stopped (10/10/23 1124)   PRN: acetaminophen , dextromethorphan , docusate sodium , hydrALAZINE , ipratropium-albuterol , mouth rinse, perflutren  lipid microspheres (DEFINITY ) IV suspension, polyethylene glycol, prochlorperazine   Assessment: 83 y.o. male with a hx of hypertension, diabetes, atrial fibrillation (on Eliquis  PTA, last dose 09/20 AM), nonobstructive coronary artery disease, moderate aortic stenosis, diastolic heart failure, COPD, active tobacco use presented  to the ED with bradycardia, now in [facility] pending procedure of PPM. Pharmacy consulted for Heparin  for atrial fibrillation.  Of note, patients last dose of Eliquis  was on 09/20 in the AM. Will obtain baseline heparin  level and aPTT and dose based on aPTT if levels are not correlating.  Baseline Hgb 14.5, Hct 45.8, Plt 194.  Goal of Therapy:  Heparin  level 0.3-0.7 units/ml aPTT 66-102 seconds Monitor platelets by anticoagulation protocol: Yes   Plan:  Start heparin  infusion at 1450 units/hr Obtain baseline anti-Xa and aPTT given recent Eliquis  use Check aPTT and anti-Xa level in 8 hours and daily while on heparin  Continue to monitor H&H and platelets with Daily CBC

## 2023-10-10 NOTE — Plan of Care (Signed)
 AAO x 4, HR 60s,  NSR,  clear liquid diet maintain, Dopamine  gtt @ 5 mcg,  vital signs stable, plan of care, assessment, monitoring, treatment, and intervention (s) ongoing, see MAR see flowsheet   Problem: Education: Goal: Knowledge of General Education information will improve Description: Including pain rating scale, medication(s)/side effects and non-pharmacologic comfort measures Outcome: Progressing   Problem: Clinical Measurements: Goal: Ability to maintain clinical measurements within normal limits will improve Outcome: Progressing   Problem: Clinical Measurements: Goal: Will remain free from infection Outcome: Progressing   Problem: Clinical Measurements: Goal: Diagnostic test results will improve Outcome: Progressing   Problem: Clinical Measurements: Goal: Respiratory complications will improve Outcome: Progressing   Problem: Clinical Measurements: Goal: Cardiovascular complication will be avoided Outcome: Progressing   Problem: Coping: Goal: Level of anxiety will decrease Outcome: Progressing   Problem: Pain Managment: Goal: General experience of comfort will improve and/or be controlled Outcome: Progressing   Problem: Safety: Goal: Ability to remain free from injury will improve Outcome: Progressing

## 2023-10-10 NOTE — Progress Notes (Signed)
 2D echo attempted, patient in chair. Will try later

## 2023-10-10 NOTE — Progress Notes (Signed)
 NAME:  Kyle Farley, MRN:  [id number], DOB:  1940/09/05, LOS: 1 ADMISSION DATE:  10/08/2023, CONSULTATION DATE:  10/09/2023 REFERRING MD: Bobette Pleasant SAUNDERS, PA-C, CHIEF COMPLAINT: Bradycardia on Dopamine    History of Present Illness:  An 83 yr old male patient with HTN, DM-2, paroxysmal atrial fibrillation (Amiodarone , Eliquis ), nonobstructive coronary artery disease, moderate aortic stenosis, diastolic CHF (EF 39%, G2DD), BPH, COPD on home oxygen  3 L [place], active smoker 2 ppd for 60 yrs, hypothyroidism, dyslipidemia, and GERD, who presented to ED with worsening DOE for 2 days. He have wheezing, seen by pulm 09/21/2023, and finished short course of Prednisone  10 days ago. He is on Trelegy 200/62.5/25, Albuterol , and Airsupra .  His SpO2 80s in ED initially, started on BiPAP 15/8/8, 100% for VBG 7.18/74/58/81%, repeat VBG 7.43/35/146/99%. Heart rate was in the low 30s (Junctional), and he was diaphoretic. Received 2 albuterol  nebs and route with EMS, then Atropine  0.5 mg, Mg 2 g IV, Solu-Medrol  125 mg, and IV Lasix  40 mg x1. Continued to have junctional bradycardia and Dopamine  was started. Normal pressures and route. Denied CP, N/V/D, abd pain, f/c/r, cough, and worsening LL edema.  Denied illicit drug use. Drinks 4 beers daily. Compliant with meds.   Pertinent  Medical History  HTN, DM-2, paroxysmal atrial fibrillation, nonobstructive coronary artery disease, moderate aortic stenosis, diastolic CHF, BPH, COPD on home oxygen  3 L [place], hypothyroidism, dyslipidemia, GERD  Significant Hospital Events: Including procedures, antibiotic start and stop dates in addition to other pertinent events   9/21: ED eval 9/21: admission to ICU on dopamine   9/22: weaned off dopamine   Interim History / Subjective:   On HFNC 9 L this morning. Mild shortness of breath at rest, wheezing, and increased cough. No chest pain, lightheadedness. Has been able to ambulate out of bed. Increased swelling in bilateral  legs  Objective    Blood pressure 123/73, pulse (!) 56, temperature 97.9 F (36.6 C), temperature source Oral, resp. rate (!) 23, height 5' 7 (1.702 m), weight 110.2 kg, SpO2 (!) 87%.    Vent Mode: PSV;BIPAP FiO2 (%):  [50 %] 50 % PEEP:  [5 cmH20] 5 cmH20   Intake/Output Summary (Last 24 hours) at 10/10/2023 9161 Last data filed at 10/10/2023 0800 Gross per 24 hour  Intake 796.54 ml  Output 2200 ml  Net -1403.46 ml   Filed Weights   10/09/23 0024 10/09/23 0500 10/10/23 0500  Weight: 107.5 kg 109.3 kg 110.2 kg    Examination: General: chronically ill appearing man on HFNC HENT:  MMM Lungs: bilateral crackles with expiratory wheezing Cardiovascular: Bradycardic Abdomen:  Soft, non tender Extremities: pitting edema to upper knees and changes of chronic venous insufficiency.  Neuro: Alert and oriented x3 with proper thought content  Resolved problem list   Assessment and Plan  Junctional bradycardia probably due to Amiodarone  and metoprolol  Elevated LA Last dose of Metoprolol  9/20 AM - Weaned off dopamine  today  - if HR in 40s and sinyes, no interventions per Cards  - if unable to ambulate and SOB, PPM likely needed - Follow up Echo - Hold Amiodarone  and BB  Paroxysmal atrial fibrillation, nonobstructive coronary artery disease, moderate aortic stenosis, HTN, diastolic CHF (EF 39%, G2DD), dyslipidemia  -Resume statin - Hold CCB - On Eliquis   COPD with Acute Exacerbation Chronic hypoxic resp failure, HOT 3 L  Left lung 5 mm nodule, seen by the pulm clinic  On HFNC 9l/min - Continue  budesonide , brovana  and yupelri  - PRN duonebs - solumedrol 40mg   IV twice daily - doxycycline  twice daily for 7 days - Bipap PRN - Trial PO 40 mg lasix  once  Mild hypoNa AKI/CKD-3b -Hold Lasix  -Avoid nephrotoxins  DM-2 -HbA1c 5.9 at goal -SSI  BPH -Flomax   Active smoker 2 ppd for 60 yrs -counseling to quit   Hypothyroidism -TFT; TSH 5 and Ft4 1.07 - Will continue  Synthroid   GERD -PPI  Labs   CBC: Recent Labs  Lab 10/08/23 2333 10/08/23 2334 10/09/23 0041 10/09/23 0441  WBC  --  14.7*  --  14.2*  NEUTROABS  --  11.3*  --   --   HGB 17.3*  17.0 15.4 14.3 14.8  HCT 51.0  50.0 49.1 42.0 45.5  MCV  --  97.8  --  94.6  PLT  --  210  --  192    Basic Metabolic Panel: Recent Labs  Lab 10/08/23 2333 10/08/23 2334 10/09/23 0041 10/09/23 0441  NA 131*  129* 131* 129* 129*  K 4.5  4.5 4.4 4.1 4.7  CL 94* 92*  --  92*  CO2  --  25  --  24  GLUCOSE 168* 167*  --  154*  BUN 22 19  --  22  CREATININE 1.50* 1.45*  --  1.52*  CALCIUM   --  8.8*  --  8.7*  MG  --   --   --  2.8*  PHOS  --   --   --  4.7*   GFR: Estimated Creatinine Clearance: 44.4 mL/min (A) (by C-G formula based on SCr of 1.52 mg/dL (H)). Recent Labs  Lab 10/08/23 2334 10/08/23 2336 10/09/23 0138 10/09/23 0441  WBC 14.7*  --   --  14.2*  LATICACIDVEN  --  3.1* 1.7 1.7    Liver Function Tests: Recent Labs  Lab 10/08/23 2334  AST 28  ALT 27  ALKPHOS 93  BILITOT 0.6  PROT 7.3  ALBUMIN  3.4*   No results for input(s): LIPASE, AMYLASE in the last 168 hours. No results for input(s): AMMONIA in the last 168 hours.  ABG    Component Value Date/Time   HCO3 23.3 10/09/2023 0041   TCO2 24 10/09/2023 0041   ACIDBASEDEF 1.0 10/09/2023 0041   O2SAT 99 10/09/2023 0041     Coagulation Profile: No results for input(s): INR, PROTIME in the last 168 hours.  Cardiac Enzymes: No results for input(s): CKTOTAL, CKMB, CKMBINDEX, TROPONINI in the last 168 hours.  HbA1C: Hgb A1c MFr Bld  Date/Time Value Ref Range Status  10/09/2023 04:41 AM 5.9 (H) 4.8 - 5.6 % Final    Comment:    (NOTE) Diagnosis of Diabetes The following HbA1c ranges recommended by the American Diabetes Association (ADA) may be used as an aid in the diagnosis of diabetes mellitus.  Hemoglobin             Suggested A1C NGSP%              Diagnosis  <5.7                    Non Diabetic  5.7-6.4                Pre-Diabetic  >6.4                   Diabetic  <7.0                   Glycemic control for  adults with diabetes.    06/21/2023 10:54 AM 6.0 (H) 4.8 - 5.6 % Final    Comment:             Prediabetes: 5.7 - 6.4          Diabetes: >6.4          Glycemic control for adults with diabetes: <7.0     CBG: Recent Labs  Lab 10/09/23 1525 10/09/23 1926 10/09/23 2313 10/10/23 0321 10/10/23 0725  GLUCAP 154* 170* 146* 148* 147*       Critical care time: 32 min     Hadassah Kristy Ahr, MD [facility] Internal Medicine Program - PGY-3 10/10/2023, 8:38 AM   See Amion for personal pager PCCM on call pager [phone] until 7pm. Please call Elink 7p-7a. [phone]

## 2023-10-11 DIAGNOSIS — E119 Type 2 diabetes mellitus without complications: Secondary | ICD-10-CM

## 2023-10-11 DIAGNOSIS — N179 Acute kidney failure, unspecified: Secondary | ICD-10-CM | POA: Diagnosis not present

## 2023-10-11 DIAGNOSIS — J441 Chronic obstructive pulmonary disease with (acute) exacerbation: Secondary | ICD-10-CM | POA: Diagnosis not present

## 2023-10-11 DIAGNOSIS — R001 Bradycardia, unspecified: Secondary | ICD-10-CM | POA: Diagnosis not present

## 2023-10-11 DIAGNOSIS — I480 Paroxysmal atrial fibrillation: Secondary | ICD-10-CM | POA: Diagnosis not present

## 2023-10-11 DIAGNOSIS — F17210 Nicotine dependence, cigarettes, uncomplicated: Secondary | ICD-10-CM

## 2023-10-11 LAB — BASIC METABOLIC PANEL WITH GFR
Anion gap: 10 (ref 5–15)
BUN: 34 mg/dL — ABNORMAL HIGH (ref 8–23)
CO2: 29 mmol/L (ref 22–32)
Calcium: 8.6 mg/dL — ABNORMAL LOW (ref 8.9–10.3)
Chloride: 96 mmol/L — ABNORMAL LOW (ref 98–111)
Creatinine, Ser: 1.48 mg/dL — ABNORMAL HIGH (ref 0.61–1.24)
GFR, Estimated: 47 mL/min — ABNORMAL LOW (ref >60)
Glucose, Bld: 137 mg/dL — ABNORMAL HIGH (ref 70–99)
Potassium: 5 mmol/L (ref 3.5–5.1)
Sodium: 135 mmol/L (ref 135–145)

## 2023-10-11 LAB — CBC
HCT: 41.3 % (ref 39.0–52.0)
Hemoglobin: 13.1 g/dL (ref 13.0–17.0)
MCH: 30.8 pg (ref 26.0–34.0)
MCHC: 31.7 g/dL (ref 30.0–36.0)
MCV: 96.9 fL (ref 80.0–100.0)
Platelets: 159 K/uL (ref 150–400)
RBC: 4.26 MIL/uL (ref 4.22–5.81)
RDW: 13.6 % (ref 11.5–15.5)
WBC: 11.8 K/uL — ABNORMAL HIGH (ref 4.0–10.5)
nRBC: 0 % (ref 0.0–0.2)

## 2023-10-11 LAB — HEPARIN LEVEL (UNFRACTIONATED): Heparin Unfractionated: >1.1 [IU]/mL — ABNORMAL HIGH (ref 0.30–0.70)

## 2023-10-11 LAB — GLUCOSE, CAPILLARY
Glucose-Capillary: 134 mg/dL — ABNORMAL HIGH (ref 70–99)
Glucose-Capillary: 115 mg/dL — ABNORMAL HIGH (ref 70–99)

## 2023-10-11 LAB — APTT: aPTT: 51 s — ABNORMAL HIGH (ref 24–36)

## 2023-10-11 MED ORDER — APIXABAN 5 MG PO TABS
5.0000 mg | ORAL_TABLET | Freq: Two times a day (BID) | ORAL | Status: DC
  Administered 2023-10-11 – 2023-10-14 (×7): 5 mg via ORAL
  Filled 2023-10-11 (×7): qty 1

## 2023-10-11 MED ORDER — SENNA 8.6 MG PO TABS
2.0000 | ORAL_TABLET | Freq: Every day | ORAL | Status: DC
  Administered 2023-10-11: 17.2 mg via ORAL
  Filled 2023-10-11 (×3): qty 2

## 2023-10-11 MED ORDER — DEXTROMETHORPHAN POLISTIREX ER 30 MG/5ML PO SUER
30.0000 mg | Freq: Every evening | ORAL | Status: DC | PRN
  Administered 2023-10-11: 30 mg via ORAL
  Filled 2023-10-11 (×2): qty 5

## 2023-10-11 MED ORDER — GUAIFENESIN ER 600 MG PO TB12
1200.0000 mg | ORAL_TABLET | Freq: Every day | ORAL | Status: DC
  Administered 2023-10-11 – 2023-10-14 (×4): 1200 mg via ORAL
  Filled 2023-10-11 (×4): qty 2

## 2023-10-11 MED ORDER — POLYETHYLENE GLYCOL 3350 17 G PO PACK
17.0000 g | PACK | Freq: Every day | ORAL | Status: DC
  Administered 2023-10-11: 17 g via ORAL
  Filled 2023-10-11 (×3): qty 1

## 2023-10-11 MED ORDER — FUROSEMIDE 40 MG PO TABS
40.0000 mg | ORAL_TABLET | Freq: Two times a day (BID) | ORAL | Status: DC
  Administered 2023-10-11 – 2023-10-12 (×3): 40 mg via ORAL
  Filled 2023-10-11 (×3): qty 1

## 2023-10-11 MED ORDER — PREDNISONE 20 MG PO TABS: 20.0000 mg | ORAL_TABLET | Freq: Every day | ORAL | Status: DC

## 2023-10-11 MED ORDER — MELATONIN 3 MG PO TABS
3.0000 mg | ORAL_TABLET | Freq: Every evening | ORAL | Status: DC | PRN
  Administered 2023-10-11 (×2): 3 mg via ORAL
  Filled 2023-10-11 (×2): qty 1

## 2023-10-11 MED ORDER — PREDNISONE 20 MG PO TABS
40.0000 mg | ORAL_TABLET | Freq: Every day | ORAL | Status: DC
  Administered 2023-10-11 – 2023-10-12 (×2): 40 mg via ORAL
  Filled 2023-10-11 (×2): qty 2

## 2023-10-11 NOTE — TOC Initial Note (Signed)
 Transition of Care [facility]) - Initial/Assessment Note    Patient Details  Name: Kyle Farley MRN: [id number] Date of Birth: 1940-04-22  [facility]) CM/SW Contact:    Lauraine FORBES Saa, LCSWA Phone Number: 10/11/2023, 3:43 PM  Clinical Narrative:                  3:44 PM CSW introduced self and role to patient. Patient's son, Kyle Farley, was also present. Patient consented CSW to speak to and in front of son. Patient confirmed he resides at home with son who would provide transportation at discharge and after discharge for appointments. Patient confirmed SNF history [facility]). Patient confirmed HH history (per chart review, history with [facility] and [facility]). Patient confirmed DME (oxygen , nebulizer machine, cane, rollator, BSC, electric scooter, manual wheelchair, hospital bed, toilet raiser) history. Per chart review, patient has a PCP and insurance. Patient's preferred pharmacy's are [facility] [address], [facility]) [phone] [place], and [facility] [place]. Per progressions, patient is not medically ready for discharge. TOC will continue to follow and be available to assist.  Expected Discharge Plan: Home/Self Care Barriers to Discharge: Continued Medical Work up   Patient Goals and CMS Choice Patient states their goals for this hospitalization and ongoing recovery are:: to return home          Expected Discharge Plan and Services       Living arrangements for the past 2 months: Single Family Home                                      Prior Living Arrangements/Services Living arrangements for the past 2 months: Single Family Home Lives with:: Adult Children Patient language and need for interpreter reviewed:: Yes        Need for Family Participation in Patient Care: No (Comment) Care giver support system in place?: Yes (comment) Current home services: DME Criminal Activity/Legal Involvement Pertinent  to Current Situation/Hospitalization: No - Comment as needed  Activities of Daily Living   ADL Screening (condition at time of admission) Is the patient deaf or have difficulty hearing?: No Does the patient have difficulty seeing, even when wearing glasses/contacts?: No Does the patient have difficulty concentrating, remembering, or making decisions?: No  Permission Sought/Granted Permission sought to share information with : Family Supports Permission granted to share information with : Yes, Verbal Permission Granted  Share Information with NAME: Kyle Farley     Permission granted to share info w Relationship: Son  Permission granted to share info w Contact Information: [phone]  Emotional Assessment Appearance:: Appears stated age Attitude/Demeanor/Rapport: Engaged Affect (typically observed): Accepting, Adaptable, Pleasant, Calm, Stable, Appropriate Orientation: : Oriented to Self, Oriented to Place, Oriented to  Time, Oriented to Situation Alcohol / Substance Use: Not Applicable Psych Involvement: No (comment)  Admission diagnosis:  Bradycardia [R00.1] Acute respiratory failure with hypoxia (HCC) [J96.01] Patient Active Problem List   Diagnosis Date Noted   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   Acute cystitis without hematuria 03/20/2023   Dysrhythmia    Medication monitoring encounter 12/18/2022   Other chest pain 12/18/2022   Diabetic polyneuropathy associated with type 2 diabetes mellitus (HCC) 08/28/2022   Bradycardia 08/05/2022   Syncope and collapse 08/05/2022   Oxygen  dependent 08/05/2022   Hypotension 08/05/2022   Laceration of right eyebrow 06/20/2022   Visit for suture removal 06/15/2022   Sepsis secondary to  UTI (HCC) 06/08/2022   Right ureteral stone 06/08/2022   AKI (acute kidney injury) 06/08/2022   Type 2 diabetes mellitus (HCC) 06/08/2022   Non-seasonal allergic rhinitis due to pollen 06/06/2022   Elevated glucose 05/27/2022   SOB  (shortness of breath) 05/10/2022   Chronic respiratory failure with hypoxia (HCC) 05/09/2022   Hyponatremia 04/26/2022   Acute on chronic diastolic heart failure (HCC) 04/25/2022   Acute hypoxic respiratory failure (HCC) 04/25/2022   Chronic hyponatremia 04/25/2022   Iron  deficiency anemia 04/25/2022   Class 1 obesity 04/25/2022   Anemia of chronic disease 04/25/2022   Chronic diastolic CHF (congestive heart failure) (HCC) 04/25/2022   Acute on chronic respiratory failure with hypoxia (HCC) 04/25/2022   Encounter for prostate cancer screening 02/16/2022   Encounter for screening for lung cancer 02/16/2022   Tobacco use disorder 02/16/2022   Bilateral leg edema 02/16/2022   Tobacco abuse 02/16/2022   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 11/10/2021   Acute on chronic systolic heart failure (HCC) 11/10/2021   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/10/2021   Atherosclerosis of native arteries of extremities with intermittent claudication, bilateral legs 11/10/2021   BPH (benign prostatic hyperplasia) 11/10/2021   Acquired hypothyroidism 11/10/2021   Cognitive communication deficit 03/05/2021   Hypo-osmolality and hyponatremia 03/05/2021   Muscle wasting and atrophy, not elsewhere classified, multiple sites 03/05/2021   Nicotine  dependence, cigarettes, uncomplicated 03/05/2021   Other abnormalities of gait and mobility 03/05/2021   Other lack of coordination 03/05/2021   Cognitive communication disorder 03/05/2021   Nicotine  dependence 03/05/2021   Pressure injury of skin 09/16/2020   Femur fracture, right (HCC) 09/12/2020   Alcohol use 09/12/2020   Hypertension    History of kidney stones    GERD (gastroesophageal reflux disease)    Dyspnea    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)    Arthritis    Kidney stones 01/24/2017   Hyperlipidemia 01/24/2017   CHF (congestive heart failure) (HCC) 01/24/2017   Paroxysmal atrial fibrillation (HCC)  10/21/2014   Essential hypertension 10/21/2014   Cigarette smoker 10/21/2014   PCP:  Sherre Clapper, MD Pharmacy:   [facility] [address], [place] - [address] [place] [address] Phone: [phone] Fax: [phone]  [facility] - [place], [place] - [address] [place] [address] Phone: [phone] Fax: [phone]  [facility]) [phone] - [place], [place] - [address] AT [facility] [address] [place] [address] Phone: [phone] Fax: [phone]     Social Drivers of Health (SDOH) Social History: SDOH Screenings   Food Insecurity: No Food Insecurity (07/27/2023)  Housing: Low Risk  (09/07/2023)  Transportation Needs: No Transportation Needs (08/08/2023)  Utilities: Not At Risk (08/08/2023)  Recent Concern: Utilities - At Risk (07/27/2023)  Alcohol Screen: Low Risk  (06/21/2023)  Depression (PHQ2-9): Low Risk  (06/21/2023)  Financial Resource Strain: Low Risk  (06/21/2023)  Physical Activity: Inactive (06/21/2023)  Social Connections: Moderately Isolated (06/21/2023)  Stress: No Stress Concern Present (06/21/2023)  Tobacco Use: High Risk (10/08/2023)  Health Literacy: Inadequate Health Literacy (08/17/2022)   SDOH Interventions:     Readmission Risk Interventions    04/26/2022    3:28 PM  Readmission Risk Prevention Plan  Transportation Screening Complete  PCP or Specialist Appt within 3-5 Days Complete  HRI or Home Care Consult Complete  Palliative Care Screening Not Applicable  Medication Review (RN Care Manager) Complete

## 2023-10-11 NOTE — Progress Notes (Signed)
 NAME:  Kyle Farley, MRN:  [id number], DOB:  16-Sep-1940, LOS: 2 ADMISSION DATE:  10/08/2023, CONSULTATION DATE:  10/09/2023 REFERRING MD: Bobette Pleasant SAUNDERS, PA-C, CHIEF COMPLAINT: Bradycardia on Dopamine    History of Present Illness:  Kyle Farley is a 83 yr old male patient with HTN, DM-2, paroxysmal atrial fibrillation (Amiodarone , Eliquis ), nonobstructive coronary artery disease, moderate aortic stenosis, diastolic CHF (EF 39%, G2DD), BPH, COPD on home oxygen  3 L [place], active smoker 2 ppd for 60 yrs, hypothyroidism, dyslipidemia, and GERD, who presented to ED with worsening DOE for 2 days. He have wheezing, seen by pulm 09/21/2023, and finished short course of Prednisone  10 days ago. He is on Trelegy 200/62.5/25, Albuterol , and Airsupra .  His SpO2 80s in ED initially, started on BiPAP 15/8/8, 100% for VBG 7.18/74/58/81%, repeat VBG 7.43/35/146/99%. Heart rate was in the low 30s (Junctional), and he was diaphoretic. Received 2 albuterol  nebs and route with EMS, then Atropine  0.5 mg, Mg 2 g IV, Solu-Medrol  125 mg, and IV Lasix  40 mg x1. Continued to have junctional bradycardia and Dopamine  was started. Normal pressures and route. Denied CP, N/V/D, abd pain, f/c/r, cough, and worsening LL edema.  Denied illicit drug use. Drinks 4 beers daily. Compliant with meds.   Pertinent  Medical History  HTN, DM-2, paroxysmal atrial fibrillation, nonobstructive coronary artery disease, moderate aortic stenosis, diastolic CHF, BPH, COPD on home oxygen  3 L [place], hypothyroidism, dyslipidemia, GERD  Significant Hospital Events: Including procedures, antibiotic start and stop dates in addition to other pertinent events   9/21: ED eval 9/21: admission to ICU on dopamine   9/22: weaned off dopamine , HR 55-60s  Interim History / Subjective:   Tired from coughing thorough the night but  feeling much better this morning. Ambulating with minimal SHOB after restarting PO lasix . Main concern is not being able to sleep  due to cough  Objective    Blood pressure (!) 134/55, pulse (!) 51, temperature 98.1 F (36.7 C), temperature source Oral, resp. rate 20, height 5' 7 (1.702 m), weight 108.8 kg, SpO2 93%.    FiO2 (%):  [40 %] 40 %   Intake/Output Summary (Last 24 hours) at 10/11/2023 0815 Last data filed at 10/11/2023 0600 Gross per 24 hour  Intake 1365.57 ml  Output 3500 ml  Net -2134.43 ml   Filed Weights   10/09/23 0500 10/10/23 0500 10/11/23 0500  Weight: 109.3 kg 110.2 kg 108.8 kg    Examination: General: chronically ill appearing HENT:  MMM Lungs: Bilateral crackles, with expiratory wheezing, improved from yesterday Cardiovascular: Bradycardic Abdomen:  Soft, non tender Extremities: Decreased pitting edema on bilateral lower extremities (trace), stable changes of chronic venous insufficiency.  Neuro:  alert and oriented x4  TTE 9/22:  Left Ventricle: Left ventricular ejection fraction, by estimation, is 60 to 65%. The left ventricle has normal function. Normal MV, moderate calcification of the AV; gradient 16, peak 31.8, valve area 3.0  Resolved problem list   Assessment and Plan   Junctional bradycardia probably due to Amiodarone  and metoprolol  Last dose of Metoprolol  9/20 AM - Weaned off dopamine  9/22 with HR 55-60s asymptomatic - Follow up Echo stable - Hold Amiodarone  and BB  - Patient stable to transfer out of ICU and to progressive floor  Paroxysmal atrial fibrillation, nonobstructive coronary artery disease, moderate aortic stenosis, HTN, diastolic CHF (EF 39%, G2DD), dyslipidemia  -Resume statin - Hold CCB - On heparin ; will resume Eliquis  today - IF AF recurs, will likely need Tikosyn  COPD with  Acute Exacerbation Chronic hypoxic resp failure, HOT 3 L [place] Left lung 5 mm nodule, seen by the pulm clinic  Pulmonary hypertension RVSP 49.6 on 9/22 TTE, new On HFNC 5 l/min today Improving - Continue  budesonide , brovana  and yupelri  - PRN duonebs - solumedrol 40mg  IV  twice daily; transition to PO prednisone  40 mg daily today until 9/25 - doxycycline  twice daily for 7 days - Bipap PRN - Continue PTA lasix  40 mg BID  HFpEF Aortic stenosis, moderate - Continue Lasix  40 mg BID - Monitor K and Mg, goal  >4 and >2, respectively - Monitor I/Os  AKI/CKD-3b - improving. -Avoid nephrotoxins  DM-2 -HbA1c 5.9 at goal -SSI  BPH -Flomax   Active smoker 2 ppd for 60 yrs -counseling to quit   Hypothyroidism -TFT; TSH 5 and Ft4 1.07 - Will continue Synthroid   GERD -PPI  Labs   CBC: Recent Labs  Lab 10/08/23 2334 10/09/23 0041 10/09/23 0441 10/10/23 0944 10/11/23 0031  WBC 14.7*  --  14.2* 15.4* 11.8*  NEUTROABS 11.3*  --   --   --   --   HGB 15.4 14.3 14.8 14.5 13.1  HCT 49.1 42.0 45.5 45.8 41.3  MCV 97.8  --  94.6 96.8 96.9  PLT 210  --  192 194 159    Basic Metabolic Panel: Recent Labs  Lab 10/08/23 2333 10/08/23 2334 10/09/23 0041 10/09/23 0441 10/10/23 0944 10/11/23 0031  NA 131*  129* 131* 129* 129* 131* 135  K 4.5  4.5 4.4 4.1 4.7 4.5 5.0  CL 94* 92*  --  92* 93* 96*  CO2  --  25  --  24 26 29   GLUCOSE 168* 167*  --  154* 132* 137*  BUN 22 19  --  22 32* 34*  CREATININE 1.50* 1.45*  --  1.52* 1.53* 1.48*  CALCIUM   --  8.8*  --  8.7* 8.9 8.6*  MG  --   --   --  2.8* 2.8*  --   PHOS  --   --   --  4.7*  --   --    GFR: Estimated Creatinine Clearance: 45.3 mL/min (A) (by C-G formula based on SCr of 1.48 mg/dL (H)). Recent Labs  Lab 10/08/23 2334 10/08/23 2336 10/09/23 0138 10/09/23 0441 10/10/23 0944 10/11/23 0031  WBC 14.7*  --   --  14.2* 15.4* 11.8*  LATICACIDVEN  --  3.1* 1.7 1.7  --   --     Liver Function Tests: Recent Labs  Lab 10/08/23 2334  AST 28  ALT 27  ALKPHOS 93  BILITOT 0.6  PROT 7.3  ALBUMIN  3.4*   No results for input(s): LIPASE, AMYLASE in the last 168 hours. No results for input(s): AMMONIA in the last 168 hours.  ABG    Component Value Date/Time   HCO3 23.3  10/09/2023 0041   TCO2 24 10/09/2023 0041   ACIDBASEDEF 1.0 10/09/2023 0041   O2SAT 99 10/09/2023 0041     Coagulation Profile: No results for input(s): INR, PROTIME in the last 168 hours.  Cardiac Enzymes: No results for input(s): CKTOTAL, CKMB, CKMBINDEX, TROPONINI in the last 168 hours.  HbA1C: Hgb A1c MFr Bld  Date/Time Value Ref Range Status  10/09/2023 04:41 AM 5.9 (H) 4.8 - 5.6 % Final    Comment:    (NOTE) Diagnosis of Diabetes The following HbA1c ranges recommended by the American Diabetes Association (ADA) may be used as an aid in the diagnosis of diabetes mellitus.  Hemoglobin             Suggested A1C NGSP%              Diagnosis  <5.7                   Non Diabetic  5.7-6.4                Pre-Diabetic  >6.4                   Diabetic  <7.0                   Glycemic control for                       adults with diabetes.    06/21/2023 10:54 AM 6.0 (H) 4.8 - 5.6 % Final    Comment:             Prediabetes: 5.7 - 6.4          Diabetes: >6.4          Glycemic control for adults with diabetes: <7.0     CBG: Recent Labs  Lab 10/10/23 1520 10/10/23 1945 10/10/23 2327 10/11/23 0325 10/11/23 0742  GLUCAP 131* 172* 172* 134* 115*       Critical care time: 32 min     Hadassah Kristy Ahr, MD [facility] - PGY-3 10/11/2023, 8:15 AM   See Amion for personal pager PCCM on call pager [phone] until 7pm. Please call Elink 7p-7a. [phone]

## 2023-10-11 NOTE — Progress Notes (Addendum)
 PHARMACY - ANTICOAGULATION CONSULT NOTE  Pharmacy Consult for Heparin  Indication: atrial fibrillation  Allergies  Allergen Reactions   Gabapentin  Other (See Comments)    Edema    Patient Measurements: Height: 5' 7 (170.2 cm) Weight: 110.2 kg (242 lb 15.2 oz) IBW/kg (Calculated) : 66.1 Heparin  Dosing Weight: 91 kg  Vital Signs: Temp: 98.1 F (36.7 C) (09/22 2329) Temp Source: Oral (09/22 2329) BP: 127/70 (09/23 0230) Pulse Rate: 50 (09/23 0230)  Labs: Recent Labs    10/08/23 2334 10/09/23 0041 10/09/23 0132 10/09/23 0441 10/10/23 0944 10/10/23 1722 10/11/23 0031 10/11/23 0032  HGB 15.4   < >  --  14.8 14.5  --  13.1  --   HCT 49.1   < >  --  45.5 45.8  --  41.3  --   PLT 210  --   --  192 194  --  159  --   APTT  --   --   --   --   --  38*  --  51*  HEPARINUNFRC  --   --   --   --   --  >1.10*  --  >1.10*  CREATININE 1.45*  --   --  1.52* 1.53*  --  1.48*  --   TROPONINIHS 20*  --  21*  --   --   --   --   --    < > = values in this interval not displayed.    Estimated Creatinine Clearance: 45.6 mL/min (A) (by C-G formula based on SCr of 1.48 mg/dL (H)).   Assessment: 83 y.o. male with a hx of hypertension, diabetes, atrial fibrillation (on Eliquis  PTA, last dose 09/20 AM), nonobstructive coronary artery disease, moderate aortic stenosis, diastolic heart failure, COPD, active tobacco use presented to the ED with bradycardia, now in [facility] pending procedure of PPM. Pharmacy consulted for Heparin  for atrial fibrillation.  Of note, patients last dose of Eliquis  was on 09/20 in the AM. Will obtain baseline heparin  level and aPTT and dose based on aPTT if levels are not correlating.  Baseline Hgb 14.5, Hct 45.8, Plt 194.  aPTT is subtherapeutic at 51 sec on 1450 units/hr. Heparin  level >1.1 and is affected by recent apixaban  use. No infusion issues or bleeding per RN. CBC is stable.  Goal of Therapy:  Heparin  level 0.3-0.7 units/ml aPTT 66-102 seconds Monitor  platelets by anticoagulation protocol: Yes   Plan:  Increase heparin  infusion to 1750 units/hr Check aPTT and anti-Xa level in 8 hours and daily while on heparin  Continue to monitor H&H and platelets with Daily CBC  Thank you for involving pharmacy in this patient's care.  Delon Sax, PharmD, BCPS Clinical Pharmacist 10/11/2023 2:40 AM

## 2023-10-11 NOTE — Progress Notes (Signed)
 eLink Physician-Brief Progress Note Patient Name: Kyle Farley DOB: 04/01/1940 MRN: [id number]   Date of Service  10/11/2023  HPI/Events of Note  Received request for melatonin Seen awake and somewhat restless  eICU Interventions  Melatonin 3 mg q hs prn ordered     Intervention Category Minor Interventions: Routine modifications to care plan (e.g. PRN medications for pain, fever)  Kyle Farley 10/11/2023, 12:04 AM

## 2023-10-11 NOTE — Plan of Care (Signed)

## 2023-10-11 NOTE — Progress Notes (Signed)
   Pt remains in sinus bradycardia in the 50-60s off of dopamine .   No urgent indication for pacing.   OK to resume eliquis .   EP will be available as needed.   Ozell Jodie Passey, PA-C  10/11/2023 7:43 AM

## 2023-10-12 ENCOUNTER — Other Ambulatory Visit (HOSPITAL_COMMUNITY): Payer: Self-pay

## 2023-10-12 DIAGNOSIS — R001 Bradycardia, unspecified: Secondary | ICD-10-CM

## 2023-10-12 LAB — BASIC METABOLIC PANEL WITH GFR
Anion gap: 11 (ref 5–15)
BUN: 30 mg/dL — ABNORMAL HIGH (ref 8–23)
CO2: 30 mmol/L (ref 22–32)
Calcium: 8.9 mg/dL (ref 8.9–10.3)
Chloride: 96 mmol/L — ABNORMAL LOW (ref 98–111)
Creatinine, Ser: 1.23 mg/dL (ref 0.61–1.24)
GFR, Estimated: 59 mL/min — ABNORMAL LOW (ref >60)
Glucose, Bld: 158 mg/dL — ABNORMAL HIGH (ref 70–99)
Potassium: 4 mmol/L (ref 3.5–5.1)
Sodium: 137 mmol/L (ref 135–145)

## 2023-10-12 LAB — CBC
HCT: 44.1 % (ref 39.0–52.0)
Hemoglobin: 14.1 g/dL (ref 13.0–17.0)
MCH: 30.9 pg (ref 26.0–34.0)
MCHC: 32 g/dL (ref 30.0–36.0)
MCV: 96.5 fL (ref 80.0–100.0)
Platelets: 169 K/uL (ref 150–400)
RBC: 4.57 MIL/uL (ref 4.22–5.81)
RDW: 13.9 % (ref 11.5–15.5)
WBC: 9 K/uL (ref 4.0–10.5)
nRBC: 0 % (ref 0.0–0.2)

## 2023-10-12 MED ORDER — DOXYCYCLINE HYCLATE 100 MG PO TABS
100.0000 mg | ORAL_TABLET | Freq: Two times a day (BID) | ORAL | Status: DC
  Administered 2023-10-12 – 2023-10-14 (×4): 100 mg via ORAL
  Filled 2023-10-12 (×4): qty 1

## 2023-10-12 MED ORDER — PREDNISONE 20 MG PO TABS
20.0000 mg | ORAL_TABLET | Freq: Every day | ORAL | Status: AC
Start: 2023-10-13 — End: 2023-10-14
  Administered 2023-10-13: 20 mg via ORAL
  Filled 2023-10-12: qty 1

## 2023-10-12 NOTE — Progress Notes (Signed)
 [facility] PROGRESS NOTE    Progress Note  Kyle Farley  FMW:[id number] DOB: 08-11-1940 DOA: 10/08/2023 PCP: Sherre Clapper, MD     Brief Narrative:   Kyle Farley is an 83 y.o. male past medical Struve atrial fibrillation on amiodarone  and Eliquis , nonobstructive coronary artery disease, chronic diastolic dysfunction with an EF of 60% and moderate aortic stenosis, COPD on 3 L of oxygen  with ongoing tobacco abuse comes in with an acute COPD exacerbation was found to be in junctional rhythm was started on dopamine  with improvement in her heart rate.  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 2D echo is stable. Amiodarone  and beta-blockers have been held. EP was consulted recommended to resume Eliquis .  Not a candidate for pacer. PT evaluated the patient recommended home health PT.  Paroxysmal atrial fibrillation/CAD/moderate aortic stenosis/chronic diastolic dysfunction: Holding calcium  channel blockers. On Eliquis . Continue statins.  Acute COPD exacerbation/acute on chronic respiratory failure with hypoxia/5 mm lung nodule and pulmonary hypertension: On 5 L today. Continue budesonide  Brovana  Brovana  and yupelri . Continue DuoNebs as needed. Now on prednisone  40 mg until 10/13/2023. Continue Doxy twice a day for 7 days. She is currently on Lasix  twice a day Inhalers as needed.  HFpEF/moderate aortic stenosis: Try to keep potassium greater than 4 magnesium  greater than 2. She is currently on Lasix  IV twice daily, she is negative about 6.5 L.  Acute kidney injury on chronic kidney disease stage IIIb: With a baseline creatinine around 1.1. On admission 1.4.  Hold Lasix  for today recheck a basic  metabolic panel tomorrow morning. Basic metabolic panels pending today.  Diabetes mellitus type 2: Continue sliding scale insulin , hemoglobin A1c 5.9.  BPH: Continue Flomax  P  Active smoker: Counseling.  Hypothyroidism:  Continue Synthroid .  GERD: Continue PPI.   DVT prophylaxis: Eliquis  Family Communication:none Status is: Inpatient Remains inpatient appropriate because: Acute COPD exacerbation    Code Status:     Code Status Orders  (From admission, onward)           Start     Ordered   10/09/23 0138  Full code  Continuous       Question:  By:  Answer:  Consent: discussion documented in EHR   10/09/23 0138           Code Status History     Date Active Date Inactive Code Status Order ID Comments User Context   06/08/2022 1959 06/13/2022 1739 DNR [id number]  Charlton Evalene RAMAN, MD Inpatient   04/28/2022 0838 04/30/2022 1747 DNR [id number]  Fairy Frames, MD Inpatient   04/25/2022 0037 04/28/2022 0838 Full Code [id number]  Marcene Eva NOVAK, DO ED   09/12/2020 1759 09/21/2020 0037 Full Code [id number]  Orlando Pond, DO ED         IV Access:   Peripheral IV   Procedures and diagnostic studies:   ECHOCARDIOGRAM COMPLETE Result Date: 10/10/2023    ECHOCARDIOGRAM REPORT   Patient Name:   Kyle Farley Date of Exam: 10/10/2023 Medical Rec #:  [id number]       Height:       67.0 in Accession #:    [id number]      Weight:       242.9 lb Date of Birth:  [birth year]  BSA:          2.197 m Patient Age:    83 years        BP:           128/61 mmHg Patient Gender: M               HR:           54 bpm. Exam Location:  Inpatient Procedure: 2D Echo, Cardiac Doppler, Color Doppler and Intracardiac            Opacification Agent (Both Spectral and Color Flow Doppler were            utilized during procedure). Indications:    I50.40* Unspecified combined systolic (congestive) and diastolic                 (congestive) heart failure  History:        Patient has prior history of  Echocardiogram examinations, most                 recent 08/02/2022. CHF, Abnormal ECG, Arrythmias:Bradycardia and                 Atrial Fibrillation, Signs/Symptoms:Shortness of Breath,                 Dyspnea, Altered Mental Status and Syncope; Risk                 Factors:Hypertension, Dyslipidemia and Current Smoker.  Sonographer:    Ellouise Mose RDCS Referring Phys: [id number] OMAR CHRISTELLA PLY  Sonographer Comments: Technically difficult study due to poor echo windows and patient is obese. Image acquisition challenging due to patient body habitus. IMPRESSIONS  1. Left ventricular ejection fraction, by estimation, is 60 to 65%. The left ventricle has normal function. The left ventricle has no regional wall motion abnormalities. Left ventricular diastolic parameters are indeterminate.  2. Right ventricular systolic function is normal. The right ventricular size is normal. There is moderately elevated pulmonary artery systolic pressure. The estimated right ventricular systolic pressure is 49.6 mmHg.  3. Left atrial size was mildly dilated.  4. The mitral valve is normal in structure. No evidence of mitral valve regurgitation. No evidence of mitral stenosis.  5. The aortic valve is calcified. There is moderate calcification of the aortic valve. Aortic valve regurgitation is not visualized. Mild aortic valve stenosis. Aortic valve area, by VTI measures 2.45 cm. Aortic valve mean gradient measures 16.0 mmHg. Aortic valve Vmax measures 2.82 m/s.  6. The inferior vena cava is dilated in size with <50% respiratory variability, suggesting right atrial pressure of 15 mmHg. FINDINGS  Left Ventricle: Left ventricular ejection fraction, by estimation, is 60 to 65%. The left ventricle has normal function. The left ventricle has no regional wall motion abnormalities. Definity  contrast agent was given IV to delineate the left ventricular  endocardial borders. The left ventricular internal cavity size was normal in size. There is no  left ventricular hypertrophy. Left ventricular diastolic parameters are indeterminate. Right Ventricle: The right ventricular size is normal. No increase in right ventricular wall thickness. Right ventricular systolic function is normal. There is moderately elevated pulmonary artery systolic pressure. The tricuspid regurgitant velocity is 2.94 m/s, and with an assumed right atrial pressure of 15 mmHg, the estimated right ventricular systolic pressure is 49.6 mmHg. Left Atrium: Left atrial size was mildly dilated. Right Atrium: Right atrial size was normal in size. Pericardium: There is no evidence of pericardial effusion. Mitral Valve: The mitral valve  is normal in structure. No evidence of mitral valve regurgitation. No evidence of mitral valve stenosis. MV peak gradient, 7.6 mmHg. The mean mitral valve gradient is 3.0 mmHg. Tricuspid Valve: The tricuspid valve is normal in structure. Tricuspid valve regurgitation is trivial. No evidence of tricuspid stenosis. Aortic Valve: The aortic valve is calcified. There is moderate calcification of the aortic valve. Aortic valve regurgitation is not visualized. Mild aortic stenosis is present. Aortic valve mean gradient measures 16.0 mmHg. Aortic valve peak gradient measures 31.8 mmHg. Aortic valve area, by VTI measures 2.45 cm. Pulmonic Valve: The pulmonic valve was not well visualized. Pulmonic valve regurgitation is not visualized. No evidence of pulmonic stenosis. Aorta: The aortic root is normal in size and structure. Venous: The inferior vena cava is dilated in size with less than 50% respiratory variability, suggesting right atrial pressure of 15 mmHg. IAS/Shunts: No atrial level shunt detected by color flow Doppler.  LEFT VENTRICLE PLAX 2D LVIDd:         5.80 cm      Diastology LVIDs:         4.30 cm      LV e' medial:   5.87 cm/s LV PW:         1.20 cm      LV E/e' medial: 20.6 LV IVS:        1.10 cm LVOT diam:     2.40 cm LV SV:         143 LV SV Index:   65 LVOT  Area:     4.52 cm  LV Volumes (MOD) LV vol d, MOD A2C: 148.0 ml LV vol d, MOD A4C: 119.0 ml LV vol s, MOD A2C: 56.1 ml LV vol s, MOD A4C: 46.8 ml LV SV MOD A2C:     91.9 ml LV SV MOD A4C:     119.0 ml LV SV MOD BP:      85.7 ml RIGHT VENTRICLE             IVC RV S prime:     12.30 cm/s  IVC diam: 2.65 cm TAPSE (M-mode): 2.9 cm LEFT ATRIUM             Index        RIGHT ATRIUM           Index LA diam:        4.30 cm 1.96 cm/m   RA Area:     22.30 cm LA Vol (A2C):   88.7 ml 40.38 ml/m  RA Volume:   62.60 ml  28.50 ml/m LA Vol (A4C):   64.6 ml 29.39 ml/m LA Biplane Vol: 92.5 ml 42.11 ml/m  AORTIC VALVE AV Area (Vmax):    2.04 cm AV Area (Vmean):   2.32 cm AV Area (VTI):     2.45 cm AV Vmax:           282.00 cm/s AV Vmean:          159.250 cm/s AV VTI:            0.583 m AV Peak Grad:      31.8 mmHg AV Mean Grad:      16.0 mmHg LVOT Vmax:         127.00 cm/s LVOT Vmean:        81.700 cm/s LVOT VTI:          0.316 m LVOT/AV VTI ratio: 0.54  AORTA Ao Root diam: 3.30 cm Ao Asc diam:  3.60 cm  MITRAL VALVE                TRICUSPID VALVE MV Area (PHT): 3.65 cm     TR Peak grad:   34.6 mmHg MV Area VTI:   2.68 cm     TR Vmax:        294.00 cm/s MV Peak grad:  7.6 mmHg MV Mean grad:  3.0 mmHg     SHUNTS MV Vmax:       1.38 m/s     Systemic VTI:  0.32 m MV Vmean:      76.2 cm/s    Systemic Diam: 2.40 cm MV Decel Time: 208 msec MV E velocity: 121.00 cm/s MV A velocity: 53.80 cm/s MV E/A ratio:  2.25 Aditya Sabharwal Electronically signed by Ria Commander Signature Date/Time: 10/10/2023/4:44:15 PM    Final      Medical Consultants:   None.   Subjective:    Kyle Farley better no complaints.  Objective:    Vitals:   10/12/23 0401 10/12/23 0548 10/12/23 0700 10/12/23 0737  BP: (!) 156/61  134/66   Pulse: (!) 54  (!) 55   Resp: 14  20   Temp: 97.7 F (36.5 C)  97.8 F (36.6 C)   TempSrc: Oral  Oral   SpO2: 93%  94% 94%  Weight:  98.1 kg    Height:       SpO2: 94 % O2 Flow Rate  (L/min): 3 L/min FiO2 (%): 40 %   Intake/Output Summary (Last 24 hours) at 10/12/2023 0922 Last data filed at 10/12/2023 0819 Gross per 24 hour  Intake 989.79 ml  Output 4300 ml  Net -3310.21 ml   Filed Weights   10/10/23 0500 10/11/23 0500 10/12/23 0548  Weight: 110.2 kg 108.8 kg 98.1 kg    Exam: General exam: In no acute distress. Respiratory system: Good air movement and clear to auscultation. Cardiovascular system: S1 & S2 heard, RRR. No JVD. Gastrointestinal system: Abdomen is nondistended, soft and nontender.  Extremities: No pedal edema. Skin: No rashes, lesions or ulcers Psychiatry: Judgement and insight appear normal. Mood & affect appropriate.    Data Reviewed:    Labs: Basic Metabolic Panel: Recent Labs  Lab 10/08/23 2333 10/08/23 2334 10/09/23 0041 10/09/23 0441 10/10/23 0944 10/11/23 0031  NA 131*  129* 131* 129* 129* 131* 135  K 4.5  4.5 4.4 4.1 4.7 4.5 5.0  CL 94* 92*  --  92* 93* 96*  CO2  --  25  --  24 26 29   GLUCOSE 168* 167*  --  154* 132* 137*  BUN 22 19  --  22 32* 34*  CREATININE 1.50* 1.45*  --  1.52* 1.53* 1.48*  CALCIUM   --  8.8*  --  8.7* 8.9 8.6*  MG  --   --   --  2.8* 2.8*  --   PHOS  --   --   --  4.7*  --   --    GFR Estimated Creatinine Clearance: 42.9 mL/min (A) (by C-G formula based on SCr of 1.48 mg/dL (H)). Liver Function Tests: Recent Labs  Lab 10/08/23 2334  AST 28  ALT 27  ALKPHOS 93  BILITOT 0.6  PROT 7.3  ALBUMIN  3.4*   No results for input(s): LIPASE, AMYLASE in the last 168 hours. No results for input(s): AMMONIA in the last 168 hours. Coagulation profile No results for input(s): INR, PROTIME in the last 168 hours. COVID-19 Labs  No results for  input(s): DDIMER, FERRITIN, LDH, CRP in the last 72 hours.  Lab Results  Component Value Date   SARSCOV2NAA NEGATIVE 09/12/2020    CBC: Recent Labs  Lab 10/08/23 2334 10/09/23 0041 10/09/23 0441 10/10/23 0944 10/11/23 0031  10/12/23 0520  WBC 14.7*  --  14.2* 15.4* 11.8* 9.0  NEUTROABS 11.3*  --   --   --   --   --   HGB 15.4 14.3 14.8 14.5 13.1 14.1  HCT 49.1 42.0 45.5 45.8 41.3 44.1  MCV 97.8  --  94.6 96.8 96.9 96.5  PLT 210  --  192 194 159 169   Cardiac Enzymes: No results for input(s): CKTOTAL, CKMB, CKMBINDEX, TROPONINI in the last 168 hours. BNP (last 3 results) No results for input(s): PROBNP in the last 8760 hours. CBG: Recent Labs  Lab 10/10/23 1520 10/10/23 1945 10/10/23 2327 10/11/23 0325 10/11/23 0742  GLUCAP 131* 172* 172* 134* 115*   D-Dimer: No results for input(s): DDIMER in the last 72 hours. Hgb A1c: No results for input(s): HGBA1C in the last 72 hours. Lipid Profile: No results for input(s): CHOL, HDL, LDLCALC, TRIG, CHOLHDL, LDLDIRECT in the last 72 hours. Thyroid  function studies: No results for input(s): TSH, T4TOTAL, T3FREE, THYROIDAB in the last 72 hours.  Invalid input(s): FREET3 Anemia work up: No results for input(s): VITAMINB12, FOLATE, FERRITIN, TIBC, IRON , RETICCTPCT in the last 72 hours. Sepsis Labs: Recent Labs  Lab 10/08/23 2336 10/09/23 0138 10/09/23 0441 10/10/23 0944 10/11/23 0031 10/12/23 0520  WBC  --   --  14.2* 15.4* 11.8* 9.0  LATICACIDVEN 3.1* 1.7 1.7  --   --   --    Microbiology Recent Results (from the past 240 hours)  MRSA Next Gen by PCR, Nasal     Status: None   Collection Time: 10/09/23  1:38 AM   Specimen: Nasal Mucosa; Nasal Swab  Result Value Ref Range Status   MRSA by PCR Next Gen NOT DETECTED NOT DETECTED Final    Comment: (NOTE) The GeneXpert MRSA Assay (FDA approved for NASAL specimens only), is one component of a comprehensive MRSA colonization surveillance program. It is not intended to diagnose MRSA infection nor to guide or monitor treatment for MRSA infections. Test performance is not FDA approved in patients less than 43 years old. Performed at [facility], [address] [place], [place] [address]      Medications:    apixaban   5 mg Oral BID   arformoterol   15 mcg Nebulization BID   atorvastatin   40 mg Oral Daily   budesonide  (PULMICORT ) nebulizer solution  0.5 mg Nebulization BID   Chlorhexidine  Gluconate Cloth  6 each Topical Daily   folic acid   1 mg Oral Daily   furosemide   40 mg Oral BID   guaiFENesin   1,200 mg Oral Daily   levothyroxine   75 mcg Oral Q0600   multivitamin with minerals  1 tablet Oral Daily   mouth rinse  15 mL Mouth Rinse 4 times per day   pantoprazole   40 mg Oral Daily   polyethylene glycol  17 g Oral Daily   predniSONE   40 mg Oral Q breakfast   revefenacin   175 mcg Nebulization Daily   senna  2 tablet Oral QHS   tamsulosin   0.4 mg Oral Daily   thiamine   100 mg Oral Daily   Continuous Infusions:  doxycycline  (VIBRAMYCIN ) IV 100 mg (10/11/23 2351)      LOS: 3 days   Erle Odell Castor  [facility]  10/12/2023, 9:22 AM

## 2023-10-12 NOTE — Plan of Care (Signed)

## 2023-10-12 NOTE — Plan of Care (Signed)
  Problem: Education: Goal: Knowledge of General Education information will improve Description: Including pain rating scale, medication(s)/side effects and non-pharmacologic comfort measures 10/12/2023 0733 by Ponciano Wells DEL, RN Outcome: Progressing 10/12/2023 0732 by Ponciano Wells DEL, RN Outcome: Progressing   Problem: Health Behavior/Discharge Planning: Goal: Ability to manage health-related needs will improve 10/12/2023 0733 by Ponciano Wells DEL, RN Outcome: Progressing 10/12/2023 0732 by Ponciano Wells DEL, RN Outcome: Progressing   Problem: Clinical Measurements: Goal: Ability to maintain clinical measurements within normal limits will improve 10/12/2023 0733 by Ponciano Wells DEL, RN Outcome: Progressing 10/12/2023 0732 by Ponciano Wells DEL, RN Outcome: Progressing Goal: Will remain free from infection 10/12/2023 0733 by Ponciano Wells DEL, RN Outcome: Progressing 10/12/2023 0732 by Ponciano Wells DEL, RN Outcome: Progressing Goal: Diagnostic test results will improve 10/12/2023 0733 by Ponciano Wells DEL, RN Outcome: Progressing 10/12/2023 0732 by Ponciano Wells DEL, RN Outcome: Progressing Goal: Respiratory complications will improve 10/12/2023 0733 by Ponciano Wells DEL, RN Outcome: Progressing 10/12/2023 0732 by Ponciano Wells DEL, RN Outcome: Progressing Goal: Cardiovascular complication will be avoided 10/12/2023 0733 by Ponciano Wells DEL, RN Outcome: Progressing 10/12/2023 0732 by Ponciano Wells DEL, RN Outcome: Progressing   Problem: Activity: Goal: Risk for activity intolerance will decrease 10/12/2023 0733 by Ponciano Wells DEL, RN Outcome: Progressing 10/12/2023 0732 by Ponciano Wells DEL, RN Outcome: Progressing

## 2023-10-12 NOTE — Telephone Encounter (Signed)
 Received fax notification from [facility] that a pa has been started for Ohtuvayre . Submitted a Prior Authorization request to [facility] [employer] for OHTUVAYRE  via CoverMyMeds. Will update once we receive a response.  Key: [id number]

## 2023-10-13 ENCOUNTER — Other Ambulatory Visit (HOSPITAL_COMMUNITY): Payer: Self-pay

## 2023-10-13 DIAGNOSIS — R001 Bradycardia, unspecified: Secondary | ICD-10-CM | POA: Diagnosis not present

## 2023-10-13 LAB — BASIC METABOLIC PANEL WITH GFR
Anion gap: 9 (ref 5–15)
BUN: 26 mg/dL — ABNORMAL HIGH (ref 8–23)
CO2: 30 mmol/L (ref 22–32)
Calcium: 9 mg/dL (ref 8.9–10.3)
Chloride: 98 mmol/L (ref 98–111)
Creatinine, Ser: 1.09 mg/dL (ref 0.61–1.24)
GFR, Estimated: >60 mL/min (ref >60)
Glucose, Bld: 95 mg/dL (ref 70–99)
Potassium: 4.2 mmol/L (ref 3.5–5.1)
Sodium: 137 mmol/L (ref 135–145)

## 2023-10-13 LAB — CBC
HCT: 47.6 % (ref 39.0–52.0)
Hemoglobin: 15.4 g/dL (ref 13.0–17.0)
MCH: 30.8 pg (ref 26.0–34.0)
MCHC: 32.4 g/dL (ref 30.0–36.0)
MCV: 95.2 fL (ref 80.0–100.0)
Platelets: 170 K/uL (ref 150–400)
RBC: 5 MIL/uL (ref 4.22–5.81)
RDW: 13.8 % (ref 11.5–15.5)
WBC: 8.7 K/uL (ref 4.0–10.5)
nRBC: 0 % (ref 0.0–0.2)

## 2023-10-13 MED ORDER — FUROSEMIDE 40 MG PO TABS
40.0000 mg | ORAL_TABLET | Freq: Every day | ORAL | Status: DC
Start: 2023-10-13 — End: 2023-10-14
  Administered 2023-10-13 – 2023-10-14 (×2): 40 mg via ORAL
  Filled 2023-10-13 (×2): qty 1

## 2023-10-13 MED ORDER — AMIODARONE HCL 200 MG PO TABS
200.0000 mg | ORAL_TABLET | Freq: Two times a day (BID) | ORAL | Status: DC
  Administered 2023-10-13 – 2023-10-14 (×3): 200 mg via ORAL
  Filled 2023-10-13 (×3): qty 1

## 2023-10-13 MED ORDER — AMIODARONE HCL 200 MG PO TABS: 200.0000 mg | ORAL_TABLET | Freq: Two times a day (BID) | ORAL | Status: DC

## 2023-10-13 NOTE — Plan of Care (Signed)

## 2023-10-13 NOTE — Evaluation (Signed)
 Physical Therapy Evaluation Patient Details Name: Kyle Farley MRN: [id number] DOB: 07-03-1940 Today's Date: 10/13/2023  History of Present Illness  Pt is an 83 y/o male admitted to the ER 9/20 with worsening DOE for 2 days,  in afib with HR in the low 30's Dopamine  started for junctional bradycardia.  Kyle Farley is an 83 year old male presenting to [facility] with 1 week of right-sided flank pain and dysuria. Pt transferred to [facility] on 5/21 for evaluation of sepsis secondary to complicated UTI/pyelonephritis in the setting of right UVJ stone. PMHx: PAF on Eliquis , chronic HFpEF, COPD-on 3 L of oxygen  at home mostly nocturnally, EtOH use/tobacco DM2, dCHF  Clinical Impression  Pt admitted with/for worsening DOE.  Pt weak and deconditioned for days of inactivity, needing CGA to light min assist overall.  Pt currently limited functionally due to the problems listed below.  (see problems list.)  Pt will benefit from PT to maximize function and safety to be able to get home safely with available assist.         If plan is discharge home, recommend the following: Assistance with cooking/housework;Other (comment) (PRN A with bathing, steps.)   Can travel by private vehicle        Equipment Recommendations None recommended by PT  Recommendations for Other Services       Functional Status Assessment Patient has had a recent decline in their functional status and demonstrates the ability to make significant improvements in function in a reasonable and predictable amount of time.     Precautions / Restrictions Precautions Precautions: Fall Recall of Precautions/Restrictions: Intact      Mobility  Bed Mobility               General bed mobility comments: OOB on arrival    Transfers Overall transfer level: Needs assistance   Transfers: Sit to/from Stand Sit to Stand: Contact guard assist           General transfer comment: appropriate need for and use of UE's on  armrests    Ambulation/Gait Ambulation/Gait assistance: Contact guard assist, Min assist Gait Distance (Feet): 25 Feet Assistive device: Rolling walker (2 wheels) Gait Pattern/deviations: Step-through pattern, Decreased step length - right, Decreased step length - left, Decreased stride length   Gait velocity interpretation: <1.31 ft/sec, indicative of household ambulator   General Gait Details: overall instability due to LE weakness from days of inactivity with CGA to min assist as he got more tremulous and unsteady on return to the chair.  Stairs            Wheelchair Mobility     Tilt Bed    Modified Rankin (Stroke Patients Only)       Balance Overall balance assessment: Needs assistance Sitting-balance support: No upper extremity supported, Feet supported Sitting balance-Leahy Scale: Fair     Standing balance support: Bilateral upper extremity supported, Single extremity supported, During functional activity, Reliant on assistive device for balance Standing balance-Leahy Scale: Poor                               Pertinent Vitals/Pain Pain Assessment Pain Assessment: Faces Faces Pain Scale: No hurt Pain Intervention(s): Monitored during session    Home Living Family/patient expects to be discharged to:: Private residence Living Arrangements: Children Available Help at Discharge: Family;Available PRN/intermittently Type of Home: Mobile home Home Access: Stairs to enter Entrance Stairs-Rails: Right;Left Entrance Stairs-Number of Steps: 5  Home Layout: One level Home Equipment: Rollator (4 wheels);Cane - single point;Shower seat;Electric scooter      Prior Function Prior Level of Function : Independent/Modified Independent;Driving             Mobility Comments: rarely drives, gets out on short basis with son, rollator in the home and cane out. ADLs Comments: mod I with ADL's with extended time.     Extremity/Trunk Assessment    Upper Extremity Assessment Upper Extremity Assessment: Generalized weakness;Overall WFL for tasks assessed    Lower Extremity Assessment Lower Extremity Assessment: Generalized weakness    Cervical / Trunk Assessment Cervical / Trunk Assessment: Normal  Communication   Communication Communication: No apparent difficulties    Cognition Arousal: Alert Behavior During Therapy: WFL for tasks assessed/performed   PT - Cognitive impairments: No apparent impairments                         Following commands: Intact       Cueing Cueing Techniques: Verbal cues     General Comments General comments (skin integrity, edema, etc.): afib HR in the upper 80''s /90's on 3L [place] , SpO2 on 3L during activity 92-94%  dyspnea 2-3/4    Exercises     Assessment/Plan    PT Assessment Patient needs continued PT services  PT Problem List Decreased strength;Decreased activity tolerance;Decreased balance;Decreased mobility;Cardiopulmonary status limiting activity       PT Treatment Interventions DME instruction;Gait training;Stair training;Functional mobility training;Therapeutic activities;Patient/family education    PT Goals (Current goals can be found in the Care Plan section)  Acute Rehab PT Goals Patient Stated Goal: Mod I and able to be by myself PT Goal Formulation: With patient Time For Goal Achievement: 10/20/23 Potential to Achieve Goals: Good    Frequency Min 3X/week     Co-evaluation               AM-PAC PT 6 Clicks Mobility  Outcome Measure Help needed turning from your back to your side while in a flat bed without using bedrails?: A Little Help needed moving from lying on your back to sitting on the side of a flat bed without using bedrails?: A Little Help needed moving to and from a bed to a chair (including a wheelchair)?: A Little Help needed standing up from a chair using your arms (e.g., wheelchair or bedside chair)?: A Little Help needed to walk  in hospital room?: A Little Help needed climbing 3-5 steps with a railing? : A Lot 6 Click Score: 17    End of Session Equipment Utilized During Treatment: Oxygen  Activity Tolerance: Patient tolerated treatment well;Patient limited by fatigue Patient left: in chair;with call bell/phone within reach;with chair alarm set Nurse Communication: Mobility status PT Visit Diagnosis: Unsteadiness on feet (R26.81);Other abnormalities of gait and mobility (R26.89);Difficulty in walking, not elsewhere classified (R26.2)    Time: 8282-8261 PT Time Calculation (min) (ACUTE ONLY): 21 min   Charges:   PT Evaluation $PT Eval Moderate Complexity: 1 Mod   PT General Charges $$ ACUTE PT VISIT: 1 Visit         10/13/2023  India HERO., PT Acute Rehabilitation Services [phone]  (office)  Vinie GAILS Kahleel Fadeley 10/13/2023, 5:51 PM

## 2023-10-13 NOTE — Care Management Important Message (Signed)
 Important Message  Patient Details  Name: Kyle Farley MRN: [id number] Date of Birth: Sep 11, 1940   Important Message Given:  Yes - Medicare IM     [name] 10/13/2023, 10:24 AM

## 2023-10-13 NOTE — Telephone Encounter (Signed)
 Received notification from [employer] regarding a prior authorization for OHTUVAYRE . Authorization has been APPROVED from 10/12/23 to 01/18/24. Approval letter sent to scan center.  Per test claim, copay for 30 days supply is $565.74  Authorization # [id number] Phone # [phone]  Will fax a copy of the approval letter and test claim to [employer] and [employer]

## 2023-10-13 NOTE — Progress Notes (Signed)
 [facility] PROGRESS NOTE    Progress Note  Kyle Farley  FMW:[id number] DOB: 02/12/1940 DOA: 10/08/2023 PCP: Sherre Clapper, MD     Brief Narrative:   Kyle Farley is an 83 y.o. male past medical Struve atrial fibrillation on amiodarone  and Eliquis , nonobstructive coronary artery disease, chronic diastolic dysfunction with an EF of 60% and moderate aortic stenosis, COPD on 3 L of oxygen  with ongoing tobacco abuse comes in with an acute COPD exacerbation was found to be in junctional rhythm was started on dopamine  with improvement in her heart rate.  Significant Events: 9/21: ED eval 9/21: admission to ICU on dopamine   9/22: weaned off dopamine , HR 55-60s  Assessment/Plan:   Junctional bradycardia bradycardia probably secondary to amiodarone  and metoprolol : Last dose of metoprolol  was 10/08/2023, was started initially on IV dopamine  which was weaned off. Beta-blockers have been held. Now slightly tachycardic, discussed with EP okay to resume amiodarone  EP was consulted recommended to resume Eliquis .  Not a candidate for pacer. PT evaluated the patient recommended home health PT.  Paroxysmal atrial fibrillation/CAD/moderate aortic stenosis/chronic diastolic dysfunction: Continue to hold Norvasc . On Eliquis . Continue statins.  Acute COPD exacerbation/acute on chronic respiratory failure with hypoxia/5 mm lung nodule and pulmonary hypertension: 3 L this morning. Continue budesonide  Brovana  Brovana  and yupelri . Continue DuoNebs as needed. Will complete steroid course today. Continue Doxy twice a day for 7 days. Resume Lasix  daily. Inhalers as needed.  HFpEF/moderate aortic stenosis: Try to keep potassium greater than 4 magnesium  greater than 2. Resume back Lasix .  Acute kidney injury on chronic kidney disease stage IIIb: With a baseline creatinine around 1.1. On admission 1.4.  Hold Lasix  for today recheck a basic metabolic panel tomorrow morning. Basic metabolic  panels pending today.  Diabetes mellitus type 2: Continue sliding scale insulin , hemoglobin A1c 5.9.  BPH: Continue Flomax  P  Active smoker: Counseling.  Hypothyroidism:  Continue Synthroid .  GERD: Continue PPI.   DVT prophylaxis: Eliquis  Family Communication:none Status is: Inpatient Remains inpatient appropriate because: Acute COPD exacerbation    Code Status:     Code Status Orders  (From admission, onward)           Start     Ordered   10/09/23 0138  Full code  Continuous       Question:  By:  Answer:  Consent: discussion documented in EHR   10/09/23 0138           Code Status History     Date Active Date Inactive Code Status Order ID Comments User Context   06/08/2022 1959 06/13/2022 1739 DNR [id number]  Charlton Evalene RAMAN, MD Inpatient   04/28/2022 0838 04/30/2022 1747 DNR [id number]  Fairy Frames, MD Inpatient   04/25/2022 0037 04/28/2022 0838 Full Code [id number]  Marcene Eva NOVAK, DO ED   09/12/2020 1759 09/21/2020 0037 Full Code [id number]  Orlando Pond, DO ED         IV Access:   Peripheral IV   Procedures and diagnostic studies:   No results found.    Medical Consultants:   None.   Subjective:    Kyle Farley feels a lot better this morning.  Objective:    Vitals:   10/12/23 1637 10/12/23 1943 10/12/23 2345 10/13/23 0556  BP: 134/75 (!) 145/80 (!) 153/100 (!) 161/135  Pulse: (!) 111 98 (!) 113 85  Resp: (!) 22 18 16 16   Temp: 97.7 F (36.5 C) (!) 97.4 F (36.3 C) (!) 97.5 F (36.4 C) 98.1 F (  36.7 C)  TempSrc: Oral Oral Oral Oral  SpO2: 93% 96% 95% 97%  Weight:    103.8 kg  Height:       SpO2: 97 % O2 Flow Rate (L/min): 3 L/min FiO2 (%): 40 %   Intake/Output Summary (Last 24 hours) at 10/13/2023 0817 Last data filed at 10/13/2023 0558 Gross per 24 hour  Intake 930 ml  Output 3150 ml  Net -2220 ml   Filed Weights   10/11/23 0500 10/12/23 0548 10/13/23 0556  Weight: 108.8 kg 98.1 kg 103.8 kg     Exam: General exam: In no acute distress. Respiratory system: Good air movement and clear to auscultation. Cardiovascular system: S1 & S2 heard, RRR. No JVD. Gastrointestinal system: Abdomen is nondistended, soft and nontender.  Extremities: No pedal edema. Skin: No rashes, lesions or ulcers Psychiatry: Judgement and insight appear normal. Mood & affect appropriate. Data Reviewed:    Labs: Basic Metabolic Panel: Recent Labs  Lab 10/09/23 0441 10/10/23 0944 10/11/23 0031 10/12/23 1016 10/13/23 0356  NA 129* 131* 135 137 137  K 4.7 4.5 5.0 4.0 4.2  CL 92* 93* 96* 96* 98  CO2 24 26 29 30 30   GLUCOSE 154* 132* 137* 158* 95  BUN 22 32* 34* 30* 26*  CREATININE 1.52* 1.53* 1.48* 1.23 1.09  CALCIUM  8.7* 8.9 8.6* 8.9 9.0  MG 2.8* 2.8*  --   --   --   PHOS 4.7*  --   --   --   --    GFR Estimated Creatinine Clearance: 60 mL/min (by C-G formula based on SCr of 1.09 mg/dL). Liver Function Tests: Recent Labs  Lab 10/08/23 2334  AST 28  ALT 27  ALKPHOS 93  BILITOT 0.6  PROT 7.3  ALBUMIN  3.4*   No results for input(s): LIPASE, AMYLASE in the last 168 hours. No results for input(s): AMMONIA in the last 168 hours. Coagulation profile No results for input(s): INR, PROTIME in the last 168 hours. COVID-19 Labs  No results for input(s): DDIMER, FERRITIN, LDH, CRP in the last 72 hours.  Lab Results  Component Value Date   SARSCOV2NAA NEGATIVE 09/12/2020    CBC: Recent Labs  Lab 10/08/23 2334 10/09/23 0041 10/09/23 0441 10/10/23 0944 10/11/23 0031 10/12/23 0520 10/13/23 0356  WBC 14.7*  --  14.2* 15.4* 11.8* 9.0 8.7  NEUTROABS 11.3*  --   --   --   --   --   --   HGB 15.4   < > 14.8 14.5 13.1 14.1 15.4  HCT 49.1   < > 45.5 45.8 41.3 44.1 47.6  MCV 97.8  --  94.6 96.8 96.9 96.5 95.2  PLT 210  --  192 194 159 169 170   < > = values in this interval not displayed.   Cardiac Enzymes: No results for input(s): CKTOTAL, CKMB, CKMBINDEX,  TROPONINI in the last 168 hours. BNP (last 3 results) No results for input(s): PROBNP in the last 8760 hours. CBG: Recent Labs  Lab 10/10/23 1520 10/10/23 1945 10/10/23 2327 10/11/23 0325 10/11/23 0742  GLUCAP 131* 172* 172* 134* 115*   D-Dimer: No results for input(s): DDIMER in the last 72 hours. Hgb A1c: No results for input(s): HGBA1C in the last 72 hours. Lipid Profile: No results for input(s): CHOL, HDL, LDLCALC, TRIG, CHOLHDL, LDLDIRECT in the last 72 hours. Thyroid  function studies: No results for input(s): TSH, T4TOTAL, T3FREE, THYROIDAB in the last 72 hours.  Invalid input(s): FREET3 Anemia work up: No results for  input(s): VITAMINB12, FOLATE, FERRITIN, TIBC, IRON , RETICCTPCT in the last 72 hours. Sepsis Labs: Recent Labs  Lab 10/08/23 2336 10/09/23 0138 10/09/23 0441 10/10/23 0944 10/11/23 0031 10/12/23 0520 10/13/23 0356  WBC  --   --  14.2* 15.4* 11.8* 9.0 8.7  LATICACIDVEN 3.1* 1.7 1.7  --   --   --   --    Microbiology Recent Results (from the past 240 hours)  MRSA Next Gen by PCR, Nasal     Status: None   Collection Time: 10/09/23  1:38 AM   Specimen: Nasal Mucosa; Nasal Swab  Result Value Ref Range Status   MRSA by PCR Next Gen NOT DETECTED NOT DETECTED Final    Comment: (NOTE) The GeneXpert MRSA Assay (FDA approved for NASAL specimens only), is one component of a comprehensive MRSA colonization surveillance program. It is not intended to diagnose MRSA infection nor to guide or monitor treatment for MRSA infections. Test performance is not FDA approved in patients less than 22 years old. Performed at [facility], [address] , [place] [address]      Medications:    amiodarone   200 mg Oral BID   apixaban   5 mg Oral BID   arformoterol   15 mcg Nebulization BID   atorvastatin   40 mg Oral Daily   budesonide  (PULMICORT ) nebulizer solution  0.5 mg Nebulization BID   doxycycline   100  mg Oral Q12H   folic acid   1 mg Oral Daily   guaiFENesin   1,200 mg Oral Daily   levothyroxine   75 mcg Oral Q0600   multivitamin with minerals  1 tablet Oral Daily   mouth rinse  15 mL Mouth Rinse 4 times per day   pantoprazole   40 mg Oral Daily   polyethylene glycol  17 g Oral Daily   predniSONE   20 mg Oral Q breakfast   revefenacin   175 mcg Nebulization Daily   senna  2 tablet Oral QHS   tamsulosin   0.4 mg Oral Daily   thiamine   100 mg Oral Daily   Continuous Infusions:      LOS: 4 days   Kyle Farley  [facility]  10/13/2023, 8:17 AM

## 2023-10-13 NOTE — Plan of Care (Signed)
  Problem: Education: Goal: Knowledge of General Education information will improve Description: Including pain rating scale, medication(s)/side effects and non-pharmacologic comfort measures 10/13/2023 0214 by Mylo Laurey RAMAN, RN Outcome: Progressing 10/13/2023 0213 by Mylo Laurey RAMAN, RN Outcome: Progressing   Problem: Health Behavior/Discharge Planning: Goal: Ability to manage health-related needs will improve 10/13/2023 0214 by Mylo Laurey RAMAN, RN Outcome: Progressing 10/13/2023 0213 by Mylo Laurey RAMAN, RN Outcome: Progressing   Problem: Clinical Measurements: Goal: Ability to maintain clinical measurements within normal limits will improve 10/13/2023 0214 by Mylo Laurey RAMAN, RN Outcome: Progressing 10/13/2023 0213 by Mylo Laurey RAMAN, RN Outcome: Progressing Goal: Will remain free from infection 10/13/2023 0214 by Mylo Laurey RAMAN, RN Outcome: Progressing 10/13/2023 0213 by Mylo Laurey RAMAN, RN Outcome: Progressing Goal: Diagnostic test results will improve 10/13/2023 0214 by Mylo Laurey RAMAN, RN Outcome: Progressing 10/13/2023 0213 by Mylo Laurey RAMAN, RN Outcome: Progressing Goal: Respiratory complications will improve 10/13/2023 0214 by Mylo Laurey RAMAN, RN Outcome: Progressing 10/13/2023 0213 by Mylo Laurey RAMAN, RN Outcome: Progressing Goal: Cardiovascular complication will be avoided 10/13/2023 0214 by Mylo Laurey RAMAN, RN Outcome: Progressing 10/13/2023 0213 by Mylo Laurey RAMAN, RN Outcome: Progressing   Problem: Activity: Goal: Risk for activity intolerance will decrease 10/13/2023 0214 by Mylo Laurey RAMAN, RN Outcome: Progressing 10/13/2023 0213 by Mylo Laurey RAMAN, RN Outcome: Progressing   Problem: Nutrition: Goal: Adequate nutrition will be maintained 10/13/2023 0214 by Mylo Laurey RAMAN, RN Outcome: Progressing 10/13/2023 0213 by Mylo Laurey RAMAN, RN Outcome: Progressing   Problem: Coping: Goal: Level of anxiety will  decrease 10/13/2023 0214 by Mylo Laurey RAMAN, RN Outcome: Progressing 10/13/2023 0213 by Mylo Laurey RAMAN, RN Outcome: Progressing   Problem: Elimination: Goal: Will not experience complications related to bowel motility 10/13/2023 0214 by Mylo Laurey RAMAN, RN Outcome: Progressing 10/13/2023 0213 by Mylo Laurey RAMAN, RN Outcome: Progressing Goal: Will not experience complications related to urinary retention 10/13/2023 0214 by Mylo Laurey RAMAN, RN Outcome: Progressing 10/13/2023 0213 by Mylo Laurey RAMAN, RN Outcome: Progressing   Problem: Pain Managment: Goal: General experience of comfort will improve and/or be controlled 10/13/2023 0214 by Mylo Laurey RAMAN, RN Outcome: Progressing 10/13/2023 0213 by Mylo Laurey RAMAN, RN Outcome: Progressing   Problem: Safety: Goal: Ability to remain free from injury will improve 10/13/2023 0214 by Mylo Laurey RAMAN, RN Outcome: Progressing 10/13/2023 0213 by Mylo Laurey RAMAN, RN Outcome: Progressing   Problem: Skin Integrity: Goal: Risk for impaired skin integrity will decrease 10/13/2023 0214 by Mylo Laurey RAMAN, RN Outcome: Progressing 10/13/2023 0213 by Mylo Laurey RAMAN, RN Outcome: Progressing

## 2023-10-14 ENCOUNTER — Other Ambulatory Visit (HOSPITAL_COMMUNITY): Payer: Self-pay

## 2023-10-14 DIAGNOSIS — R001 Bradycardia, unspecified: Secondary | ICD-10-CM | POA: Diagnosis not present

## 2023-10-14 LAB — CBC
HCT: 48.1 % (ref 39.0–52.0)
Hemoglobin: 15.4 g/dL (ref 13.0–17.0)
MCH: 30.7 pg (ref 26.0–34.0)
MCHC: 32 g/dL (ref 30.0–36.0)
MCV: 95.8 fL (ref 80.0–100.0)
Platelets: 192 K/uL (ref 150–400)
RBC: 5.02 MIL/uL (ref 4.22–5.81)
RDW: 13.9 % (ref 11.5–15.5)
WBC: 9.4 K/uL (ref 4.0–10.5)
nRBC: 0 % (ref 0.0–0.2)

## 2023-10-14 MED ORDER — FUROSEMIDE 40 MG PO TABS: 40.0000 mg | ORAL_TABLET | Freq: Every day | ORAL | Status: AC

## 2023-10-14 MED ORDER — DOXYCYCLINE HYCLATE 100 MG PO TABS
100.0000 mg | ORAL_TABLET | Freq: Two times a day (BID) | ORAL | 0 refills | Status: AC
  Filled 2023-10-14: qty 2, 1d supply, fill #0

## 2023-10-14 NOTE — Discharge Summary (Signed)
 Physician Discharge Summary  Kyle Farley FMW:[id number] DOB: [birth year] DOA: 10/08/2023  PCP: Sherre Clapper, MD  Admit date: 10/08/2023 Discharge date: 10/14/2023  Admitted From: Home Disposition:  Home  Recommendations for Outpatient Follow-up:  Follow up with PCP in 1-2 weeks Please obtain BMP/CBC in one week   Home Health:PT  Equipment/Devices:  Discharge Condition:Stable CODE STATUS:Full Diet recommendation: Heart Healthy  Brief/Interim Summary: 83 y.o. male past medical Struve atrial fibrillation on amiodarone  and Eliquis , nonobstructive coronary artery disease, chronic diastolic dysfunction with an EF of 60% and moderate aortic stenosis, COPD on 3 L of oxygen  with ongoing tobacco abuse comes in with an acute COPD exacerbation was found to be in junctional rhythm was started on dopamine  with improvement in her heart rate.   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 Metoprolol  was held he was started on IV dopamine  which is now being weaned off. His 2D echo showed an acute findings. EP was consulted recommended to resume Eliquis  and amiodarone  he is not a candidate for pacer. He will have to be discharged home off beta-blockers. He will go home with home health PT.    Paroxysmal atrial fibrillation/CAD/moderate aortic stenosis/chronic diastolic dysfunction: Continue Eliquis  and statins. Calcium  channel blockers were held on admission that we resume as an outpatient.  Acute COPD exacerbation/chronic  respiratory failure with hypoxia: He completed his course of antibiotics and steroids in house will continue antibiotics for 1 more days. Continue inhalers no changes made.  HFpEF/moderate aortic stenosis: He will continue Lasix  once a day at home.  Acute kidney injury on chronic kidney disease stage IIIb: With a baseline creatinine around 1.1. On admission 1.4.  This was his creatinine returned to baseline.   Diabetes mellitus type 2: Continue sliding scale insulin , hemoglobin A1c 5.9.   BPH: Continue Flomax  P   Active smoker: Counseling.   Hypothyroidism:  Continue Synthroid .  Discharge Instructions  Discharge Instructions     Diet - low sodium heart healthy   Complete by: As directed    Increase activity slowly   Complete by: As directed       Allergies as of 10/14/2023       Reactions   Gabapentin  Other (See Comments)   Edema        Medication List     STOP taking these medications    metoprolol  succinate 50 MG 24 hr tablet Commonly known as: TOPROL -XL       TAKE these medications    Airsupra  90-80 MCG/ACT Aero Generic drug: Albuterol -Budesonide  Inhale 2 puffs into the lungs every 4 (four) hours as needed. What changed: reasons to take this   albuterol  (2.5 MG/3ML) 0.083% nebulizer solution Commonly known as: [name]  Take 3 mLs (2.5 mg total) by nebulization every 6 (six) hours as needed for shortness of breath or wheezing.   amiodarone  200 MG tablet Commonly known as: PACERONE  Take 200 mg by mouth 2 (two) times daily.   amLODipine  5 MG tablet Commonly known as: NORVASC  Take 1 tablet by mouth once daily   apixaban  5 MG Tabs tablet Commonly  known as: Eliquis  Take 1 tablet (5 mg total) by mouth 2 (two) times daily.   atorvastatin  40 MG tablet Commonly known as: LIPITOR Take 1 tablet by mouth once daily   doxycycline  100 MG tablet Commonly known as: VIBRA -TABS Take 1 tablet (100 mg total) by mouth every 12 (twelve) hours for 1  day.   ferrous sulfate  325 (65 FE) MG EC tablet Take 325 mg by mouth daily with breakfast.   furosemide  40 MG tablet Commonly known as: LASIX  Take 1 tablet (40 mg total) by mouth daily. What changed: when to take this   Jardiance  25 MG Tabs tablet Generic drug: empagliflozin  TAKE 1 TABLET BY MOUTH ONCE DAILY BEFORE BREAKFAST   levothyroxine  75 MCG tablet Commonly known as: SYNTHROID  Take 1 tablet (75 mcg total) by mouth daily.   montelukast  10 MG tablet Commonly known as: SINGULAIR  TAKE 1 TABLET BY MOUTH ONCE DAILY AT 12 NOON What changed: See the new instructions.   nitroGLYCERIN  0.4 MG SL tablet Commonly known as: NITROSTAT  DISSOLVE ONE TABLET UNDER THE TONGUE EVERY 5 MINUTES AS NEEDED FOR CHEST PAIN.  DO NOT EXCEED A TOTAL OF 3 DOSES IN 15 MINUTES   OXYGEN  Inhale 3 L into the lungs as needed (shortness of breath).   pantoprazole  40 MG tablet Commonly known as: PROTONIX  Take 1 tablet by mouth once daily   Trelegy Ellipta  200-62.5-25 MCG/ACT Aepb Generic drug: Fluticasone -Umeclidin-Vilant Inhale 1 puff into the lungs daily.        Allergies  Allergen Reactions   Gabapentin  Other (See Comments)    Edema    Consultations: Pulmonary and critical care Electrophysiology   Procedures/Studies: ECHOCARDIOGRAM COMPLETE Result Date: 10/10/2023    ECHOCARDIOGRAM REPORT   Patient Name:   Kyle Farley Date of Exam: 10/10/2023 Medical Rec #:  [id number]       Height:       67.0 in Accession #:    [id number]      Weight:       242.9 lb Date of Birth:  01-25-1940        BSA:          2.197 m Patient Age:    83 years        BP:           128/61 mmHg Patient Gender: M               HR:           54 bpm. Exam Location:  Inpatient Procedure: 2D Echo, Cardiac Doppler, Color Doppler and Intracardiac            Opacification Agent (Both Spectral and Color Flow Doppler were            utilized during procedure). Indications:    I50.40* Unspecified combined systolic (congestive) and  diastolic                 (congestive) heart failure  History:        Patient has prior history of Echocardiogram examinations, most                 recent 08/02/2022. CHF, Abnormal ECG, Arrythmias:Bradycardia and                 Atrial Fibrillation, Signs/Symptoms:Shortness of Breath,                 Dyspnea, Altered Mental Status and Syncope; Risk  Factors:Hypertension, Dyslipidemia and Current Smoker.  Sonographer:    Ellouise Mose RDCS Referring Phys: [id number] OMAR CHRISTELLA PLY  Sonographer Comments: Technically difficult study due to poor echo windows and patient is obese. Image acquisition challenging due to patient body habitus. IMPRESSIONS  1. Left ventricular ejection fraction, by estimation, is 60 to 65%. The left ventricle has normal function. The left ventricle has no regional wall motion abnormalities. Left ventricular diastolic parameters are indeterminate.  2. Right ventricular systolic function is normal. The right ventricular size is normal. There is moderately elevated pulmonary artery systolic pressure. The estimated right ventricular systolic pressure is 49.6 mmHg.  3. Left atrial size was mildly dilated.  4. The mitral valve is normal in structure. No evidence of mitral valve regurgitation. No evidence of mitral stenosis.  5. The aortic valve is calcified. There is moderate calcification of the aortic valve. Aortic valve regurgitation is not visualized. Mild aortic valve stenosis. Aortic valve area, by VTI measures 2.45 cm. Aortic valve mean gradient measures 16.0 mmHg. Aortic valve Vmax measures 2.82 m/s.  6. The inferior vena cava is dilated in size with <50% respiratory variability, suggesting right atrial pressure of 15 mmHg. FINDINGS  Left Ventricle: Left ventricular ejection fraction, by estimation, is 60 to 65%. The left ventricle has normal function. The left ventricle has no regional wall motion abnormalities. Definity  contrast agent was given IV to delineate the left  ventricular  endocardial borders. The left ventricular internal cavity size was normal in size. There is no left ventricular hypertrophy. Left ventricular diastolic parameters are indeterminate. Right Ventricle: The right ventricular size is normal. No increase in right ventricular wall thickness. Right ventricular systolic function is normal. There is moderately elevated pulmonary artery systolic pressure. The tricuspid regurgitant velocity is 2.94 m/s, and with an assumed right atrial pressure of 15 mmHg, the estimated right ventricular systolic pressure is 49.6 mmHg. Left Atrium: Left atrial size was mildly dilated. Right Atrium: Right atrial size was normal in size. Pericardium: There is no evidence of pericardial effusion. Mitral Valve: The mitral valve is normal in structure. No evidence of mitral valve regurgitation. No evidence of mitral valve stenosis. MV peak gradient, 7.6 mmHg. The mean mitral valve gradient is 3.0 mmHg. Tricuspid Valve: The tricuspid valve is normal in structure. Tricuspid valve regurgitation is trivial. No evidence of tricuspid stenosis. Aortic Valve: The aortic valve is calcified. There is moderate calcification of the aortic valve. Aortic valve regurgitation is not visualized. Mild aortic stenosis is present. Aortic valve mean gradient measures 16.0 mmHg. Aortic valve peak gradient measures 31.8 mmHg. Aortic valve area, by VTI measures 2.45 cm. Pulmonic Valve: The pulmonic valve was not well visualized. Pulmonic valve regurgitation is not visualized. No evidence of pulmonic stenosis. Aorta: The aortic root is normal in size and structure. Venous: The inferior vena cava is dilated in size with less than 50% respiratory variability, suggesting right atrial pressure of 15 mmHg. IAS/Shunts: No atrial level shunt detected by color flow Doppler.  LEFT VENTRICLE PLAX 2D LVIDd:         5.80 cm      Diastology LVIDs:         4.30 cm      LV e' medial:   5.87 cm/s LV PW:         1.20 cm       LV E/e' medial: 20.6 LV IVS:        1.10 cm LVOT diam:     2.40 cm LV SV:  143 LV SV Index:   65 LVOT Area:     4.52 cm  LV Volumes (MOD) LV vol d, MOD A2C: 148.0 ml LV vol d, MOD A4C: 119.0 ml LV vol s, MOD A2C: 56.1 ml LV vol s, MOD A4C: 46.8 ml LV SV MOD A2C:     91.9 ml LV SV MOD A4C:     119.0 ml LV SV MOD BP:      85.7 ml RIGHT VENTRICLE             IVC RV S prime:     12.30 cm/s  IVC diam: 2.65 cm TAPSE (M-mode): 2.9 cm LEFT ATRIUM             Index        RIGHT ATRIUM           Index LA diam:        4.30 cm 1.96 cm/m   RA Area:     22.30 cm LA Vol (A2C):   88.7 ml 40.38 ml/m  RA Volume:   62.60 ml  28.50 ml/m LA Vol (A4C):   64.6 ml 29.39 ml/m LA Biplane Vol: 92.5 ml 42.11 ml/m  AORTIC VALVE AV Area (Vmax):    2.04 cm AV Area (Vmean):   2.32 cm AV Area (VTI):     2.45 cm AV Vmax:           282.00 cm/s AV Vmean:          159.250 cm/s AV VTI:            0.583 m AV Peak Grad:      31.8 mmHg AV Mean Grad:      16.0 mmHg LVOT Vmax:         127.00 cm/s LVOT Vmean:        81.700 cm/s LVOT VTI:          0.316 m LVOT/AV VTI ratio: 0.54  AORTA Ao Root diam: 3.30 cm Ao Asc diam:  3.60 cm MITRAL VALVE                TRICUSPID VALVE MV Area (PHT): 3.65 cm     TR Peak grad:   34.6 mmHg MV Area VTI:   2.68 cm     TR Vmax:        294.00 cm/s MV Peak grad:  7.6 mmHg MV Mean grad:  3.0 mmHg     SHUNTS MV Vmax:       1.38 m/s     Systemic VTI:  0.32 m MV Vmean:      76.2 cm/s    Systemic Diam: 2.40 cm MV Decel Time: 208 msec MV E velocity: 121.00 cm/s MV A velocity: 53.80 cm/s MV E/A ratio:  2.25 Aditya Sabharwal Electronically signed by Ria Commander Signature Date/Time: 10/10/2023/4:44:15 PM    Final    DG Chest Port 1 View Result Date: 10/08/2023 EXAM: 1 VIEW XRAY OF THE CHEST 10/08/2023 11:37:00 PM COMPARISON: 05/10/2023 CLINICAL HISTORY: SOB. Resp distress FINDINGS: LUNGS AND PLEURA: No frank interstitial edema. Possible trace bilateral pleural effusions. HEART AND MEDIASTINUM: Stable cardiomegaly.  BONES AND SOFT TISSUES: No acute osseous abnormality. Defibrillator pads overlying the right hemithorax. VASCULATURE: Thoracic atherosclerosis. IMPRESSION: 1. Stable cardiomegaly. 2. Possible trace bilateral pleural effusions. No frank interstitial edema. Electronically signed by: Pinkie Pebbles MD 10/08/2023 11:40 PM EDT RP Workstation: [id number]   (Echo, Carotid, EGD, Colonoscopy, ERCP)    Subjective: No complaints  Discharge Exam: Vitals:  10/14/23 0859 10/14/23 0906  BP:    Pulse:    Resp:    Temp:    SpO2: 98% 97%   Vitals:   10/14/23 0744 10/14/23 0856 10/14/23 0859 10/14/23 0906  BP: 116/71     Pulse: 95     Resp: 18     Temp: 97.7 F (36.5 C)     TempSrc: Oral     SpO2: 92% 99% 98% 97%  Weight:      Height:        General: Pt is alert, awake, not in acute distress Cardiovascular: RRR, S1/S2 +, no rubs, no gallops Respiratory: CTA bilaterally, no wheezing, no rhonchi Abdominal: Soft, NT, ND, bowel sounds + Extremities: no edema, no cyanosis    The results of significant diagnostics from this hospitalization (including imaging, microbiology, ancillary and laboratory) are listed below for reference.     Microbiology: Recent Results (from the past 240 hours)  MRSA Next Gen by PCR, Nasal     Status: None   Collection Time: 10/09/23  1:38 AM   Specimen: Nasal Mucosa; Nasal Swab  Result Value Ref Range Status   MRSA by PCR Next Gen NOT DETECTED NOT DETECTED Final    Comment: (NOTE) The GeneXpert MRSA Assay (FDA approved for NASAL specimens only), is one component of a comprehensive MRSA colonization surveillance program. It is not intended to diagnose MRSA infection nor to guide or monitor treatment for MRSA infections. Test performance is not FDA approved in patients less than 34 years old. Performed at [facility], [address] [place], [place] [address]      Labs: BNP (last 3 results) Recent Labs    10/08/23 2334  BNP 686.0*   Basic  Metabolic Panel: Recent Labs  Lab 10/09/23 0441 10/10/23 0944 10/11/23 0031 10/12/23 1016 10/13/23 0356  NA 129* 131* 135 137 137  K 4.7 4.5 5.0 4.0 4.2  CL 92* 93* 96* 96* 98  CO2 24 26 29 30 30   GLUCOSE 154* 132* 137* 158* 95  BUN 22 32* 34* 30* 26*  CREATININE 1.52* 1.53* 1.48* 1.23 1.09  CALCIUM  8.7* 8.9 8.6* 8.9 9.0  MG 2.8* 2.8*  --   --   --   PHOS 4.7*  --   --   --   --    Liver Function Tests: Recent Labs  Lab 10/08/23 2334  AST 28  ALT 27  ALKPHOS 93  BILITOT 0.6  PROT 7.3  ALBUMIN  3.4*   No results for input(s): LIPASE, AMYLASE in the last 168 hours. No results for input(s): AMMONIA in the last 168 hours. CBC: Recent Labs  Lab 10/08/23 2334 10/09/23 0041 10/10/23 0944 10/11/23 0031 10/12/23 0520 10/13/23 0356 10/14/23 0438  WBC 14.7*   < > 15.4* 11.8* 9.0 8.7 9.4  NEUTROABS 11.3*  --   --   --   --   --   --   HGB 15.4   < > 14.5 13.1 14.1 15.4 15.4  HCT 49.1   < > 45.8 41.3 44.1 47.6 48.1  MCV 97.8   < > 96.8 96.9 96.5 95.2 95.8  PLT 210   < > 194 159 169 170 192   < > = values in this interval not displayed.   Cardiac Enzymes: No results for input(s): CKTOTAL, CKMB, CKMBINDEX, TROPONINI in the last 168 hours. BNP: Invalid input(s): POCBNP CBG: Recent Labs  Lab 10/10/23 1520 10/10/23 1945 10/10/23 2327 10/11/23 0325 10/11/23 0742  GLUCAP  131* 172* 172* 134* 115*   D-Dimer No results for input(s): DDIMER in the last 72 hours. Hgb A1c No results for input(s): HGBA1C in the last 72 hours. Lipid Profile No results for input(s): CHOL, HDL, LDLCALC, TRIG, CHOLHDL, LDLDIRECT in the last 72 hours. Thyroid  function studies No results for input(s): TSH, T4TOTAL, T3FREE, THYROIDAB in the last 72 hours.  Invalid input(s): FREET3 Anemia work up No results for input(s): VITAMINB12, FOLATE, FERRITIN, TIBC, IRON , RETICCTPCT in the last 72 hours. Urinalysis    Component Value Date/Time    COLORURINE YELLOW 06/09/2022 0901   APPEARANCEUR CLEAR 06/09/2022 0901   LABSPEC 1.011 06/09/2022 0901   PHURINE 6.0 06/09/2022 0901   GLUCOSEU 50 (A) 06/09/2022 0901   HGBUR LARGE (A) 06/09/2022 0901   BILIRUBINUR negative 03/18/2023 1055   KETONESUR negative 03/18/2023 1055   KETONESUR NEGATIVE 06/09/2022 0901   PROTEINUR 30 (A) 06/09/2022 0901   UROBILINOGEN 0.2 03/18/2023 1055   NITRITE Negative 03/18/2023 1055   NITRITE NEGATIVE 06/09/2022 0901   LEUKOCYTESUR Negative 03/18/2023 1055   LEUKOCYTESUR MODERATE (A) 06/09/2022 0901   Sepsis Labs Recent Labs  Lab 10/11/23 0031 10/12/23 0520 10/13/23 0356 10/14/23 0438  WBC 11.8* 9.0 8.7 9.4   Microbiology Recent Results (from the past 240 hours)  MRSA Next Gen by PCR, Nasal     Status: None   Collection Time: 10/09/23  1:38 AM   Specimen: Nasal Mucosa; Nasal Swab  Result Value Ref Range Status   MRSA by PCR Next Gen NOT DETECTED NOT DETECTED Final    Comment: (NOTE) The GeneXpert MRSA Assay (FDA approved for NASAL specimens only), is one component of a comprehensive MRSA colonization surveillance program. It is not intended to diagnose MRSA infection nor to guide or monitor treatment for MRSA infections. Test performance is not FDA approved in patients less than 54 years old. Performed at [facility], [address] [place], [place] [address]      Time coordinating discharge: Over 30 minutes  SIGNED:   Erle Odell Castor, MD  [facility] 10/14/2023, 9:30 AM Pager   If 7PM-7AM, please contact night-coverage [email or web address] Password [email or web address]

## 2023-10-14 NOTE — TOC Transition Note (Signed)
 Transition of Care [facility]) - Discharge Note   Patient Details  Name: Kyle Farley MRN: [id number] Date of Birth: 05-18-1940  Transition [facility] Care [facility]) CM/SW Contact:  Sudie Erminio Deems, RN Phone Number: 10/14/2023, 10:54 AM   Clinical Narrative: Patient will transition home today with home health PT. Inpatient Case Manager discussed plan with the patient and he is agreeable to services. Patient does not have an agency preference. Referral submitted to [facility] and start of care to begin within 24-48 hours post transition home. Patient states son will provide transportation home. No further needs identified.   Final next level of care: Home w Home Health Services Barriers to Discharge: No Barriers Identified   Patient Goals and CMS Choice Patient states their goals for this hospitalization and ongoing recovery are:: to return home   Choice offered to / list presented to : Patient (Patient did not have an agency choice.)  Discharge Plan and Services Additional resources added to the After Visit Summary for       Post Acute Care Choice: Home Health             HH Arranged: PT [facility] Agency: [facility] Date [facility] Agency Contacted: 10/14/23 Time HH Agency Contacted: 1054 Representative spoke with at [facility] Agency: Amy  Social Drivers of Health (SDOH) Interventions SDOH Screenings   Food Insecurity: No Food Insecurity (10/12/2023)  Housing: Low Risk  (10/12/2023)  Transportation Needs: No Transportation Needs (10/12/2023)  Utilities: Not At Risk (10/12/2023)  Recent Concern: Utilities - At Risk (07/27/2023)  Alcohol Screen: Low Risk  (06/21/2023)  Depression (PHQ2-9): Low Risk  (06/21/2023)  Financial Resource Strain: Low Risk  (06/21/2023)  Physical Activity: Inactive (06/21/2023)  Social Connections: Moderately Isolated (10/12/2023)  Stress: No Stress Concern Present (06/21/2023)  Tobacco Use: High Risk (10/08/2023)  Health Literacy: Inadequate Health Literacy (08/17/2022)    Readmission Risk Interventions    04/26/2022    3:28 PM  Readmission Risk Prevention Plan  Transportation Screening Complete  PCP or Specialist Appt within 3-5 Days Complete  HRI or Home Care Consult Complete  Palliative Care Screening Not Applicable  Medication Review (RN Care Manager) Complete

## 2023-10-14 NOTE — Plan of Care (Signed)

## 2023-10-14 NOTE — Progress Notes (Signed)
 Discharge  Extensive education on discharge plan of care. With teachback. Patient verbally understands plan of care.  TOC meds at bedside.  PIV's removed.  Tele remains and oxygen  probe remains on.  Ride on the way.  Patient will wait for ride on unit.  Fall risk, maintained on 3L ox.  Unsteady ambulating with walker.  RN updated.

## 2023-10-17 ENCOUNTER — Ambulatory Visit (INDEPENDENT_AMBULATORY_CARE_PROVIDER_SITE_OTHER): Admitting: Podiatry

## 2023-10-17 ENCOUNTER — Encounter: Payer: Self-pay | Admitting: Podiatry

## 2023-10-17 DIAGNOSIS — B351 Tinea unguium: Secondary | ICD-10-CM | POA: Diagnosis not present

## 2023-10-17 DIAGNOSIS — M79675 Pain in left toe(s): Secondary | ICD-10-CM

## 2023-10-17 DIAGNOSIS — M79674 Pain in right toe(s): Secondary | ICD-10-CM | POA: Diagnosis not present

## 2023-10-17 DIAGNOSIS — E1142 Type 2 diabetes mellitus with diabetic polyneuropathy: Secondary | ICD-10-CM | POA: Diagnosis not present

## 2023-10-17 NOTE — Progress Notes (Unsigned)
  Subjective:  Patient ID: Kyle Farley, male    DOB: [birth year],  MRN: [id number]  Chief Complaint  Patient presents with   [facility] / Qutenza     A1c was 7.9 in Sept. Eliquis  [name] (last dine in June), with qutenza  treatment. Stated there was some difference but not a lot after the last qutenza  treatment in July.     83 y.o. male presents with the above complaint. History confirmed with patient. Patient presenting with pain related to dystrophic thickened elongated nails. Patient is unable to trim own nails related to nail dystrophy and/or mobility issues. Patient does  have a history of T2DM.  Last A1c 7.9.  Is on chronic Eliquis .  He reports neuropathic pain.  He is also here for Qutenza  treatment.  Objective:  Physical exam: Warm, cap refill approximately 3 seconds to the digits, pedal hair growth absent.  Pedal skin atrophic, xerotic.  Webspaces clean and dry nail exam onychomycosis of the toenails, onycholysis, and dystrophic nails, greater than 3 mm thickening DP pulses palpable, protective sensation absent, and vibratory sensation absent, PT pulses faintly palpable.  Mild +1 pitting edema Left Foot:  Pain with palpation of nails due to elongation and dystrophic growth.  Right Foot: Pain with palpation of nails due to elongation and dystrophic growth.  Assessment:   1. Diabetic polyneuropathy associated with type 2 diabetes mellitus (HCC)   2. Pain due to onychomycosis of toenails of both feet       Plan:  Patient was evaluated and treated and all questions answered.  #Onychomycosis with pain  -Nails palliatively debrided as below. -Educated on self-care  Procedure: Nail Debridement Rationale: Pain Type of Debridement: manual, sharp debridement. Instrumentation: Nail nipper, rotary burr. Number of Nails: 10  # Diabetes with neuropathy Patient educated on diabetes. Discussed proper diabetic foot care and discussed risks and complications of disease. Educated patient in depth  on reasons to return to the office immediately should he/she discover anything concerning or new on the feet. All questions answered. Discussed proper shoes as well.   Procedure: Qutenza  application for diabetic neuropathy - Verbal consent was obtained to administer Qutenza  treatment, second treatment for patient - Left and right foot were washed with soapy water prior to the treatment.  They are allowed to completely dry before initiating treatment.  2 sheets of the Qutenza  were applied to each foot taking care to cover the entirety of the toes and the plantar surface of both feet where patient reports maximum symptoms.  The shoes were then secured in place using Coban - The medication were allowed to remain in place for 30 minutes.  There were then probably removed and the feet were cleansed with manufacture provided cleansing solution and rinsed off with soapy water and thoroughly dried. - Patient tolerated procedure well without any adverse effects or reaction - Will plan for third treatment in about 3 months.   Return in about 3 months (around 01/16/2024) for Diabetic Foot Care.         Ethan Saddler, DPM [facility] / [facility]

## 2023-10-30 DIAGNOSIS — I498 Other specified cardiac arrhythmias: Secondary | ICD-10-CM | POA: Insufficient documentation

## 2023-10-30 NOTE — Assessment & Plan Note (Signed)
 Kyle Farley

## 2023-10-30 NOTE — Progress Notes (Unsigned)
 Subjective:  Patient ID: Kyle Farley, male    DOB: 10/06/1940  Age: 83 y.o. MRN: [id number]  No chief complaint on file.   HPI: Discussed the use of AI scribe software for clinical note transcription with the patient, who gave verbal consent to proceed.  History of Present Illness   Admit: 10/08/2023 Discharge: 10/14/2023  acute COPD exacerbation was found to be in junctional rhythm was started on dopamine  with improvement in her heart rate. course of antibiotics and steroids in house will continue antibiotics for 1 more days.   Junctional rhythm with bradycardia possibly secondary to amiodarone  and metoprolol . Metoprolol  was held he was started on IV dopamine  which is now being weaned off. His 2D echo showed an acute findings. EP was consulted recommended to resume Eliquis  and amiodarone  he is not a candidate for pacer. He will have to be discharged home off beta-blockers. He will go home with home health PT.     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)   Alcohol abuse with alcohol-induced mood disorder (HCC)   BPH (benign prostatic hyperplasia)      06/21/2023   10:19 AM 10/20/2022    9:17 AM 08/05/2022   11:08 AM 05/27/2022    4:12 PM 04/02/2022   11:24 AM  Depression screen PHQ 2/9  Decreased Interest 0 0 0 0 0  Down, Depressed, Hopeless 0 0 0 0 0  PHQ - 2 Score 0 0 0 0 0  Altered sleeping   0 0   Tired, decreased energy   0 2   Change in appetite   0 2   Feeling bad or failure about yourself    0 0   Trouble concentrating   0 0   Moving slowly or fidgety/restless   0 3   Suicidal thoughts   0 0   PHQ-9 Score   0 7   Difficult doing work/chores   Not difficult at all Somewhat difficult         08/08/2023   12:37 PM  Fall Risk   Falls in the past year? 0    Patient Care  Team: Sherre Clapper, MD as PCP - General (Family Medicine) Monetta Redell PARAS, MD as PCP - Cardiology (Cardiology) Inocencio Soyla Lunger, MD as Consulting Physician (Cardiology) Monetta Redell PARAS, MD as Consulting Physician (Cardiology) Carlin Delon BROCKS, NP as Nurse Practitioner (Cardiology) Prentiss Heddy HERO, RN as [facility] Care Management   Review of Systems  Constitutional:  Negative for chills, fatigue and fever.  HENT:  Negative for congestion, ear pain and sore throat.   Respiratory:  Negative for cough and shortness of breath.   Cardiovascular:  Negative for chest pain.  Gastrointestinal:  Negative for abdominal pain, constipation, diarrhea, nausea and vomiting.  Endocrine: Negative for polydipsia, polyphagia and polyuria.  Genitourinary:  Negative for dysuria and frequency.  Musculoskeletal:  Negative for arthralgias and myalgias.  Neurological:  Negative for dizziness and headaches.  Psychiatric/Behavioral:  Negative for dysphoric mood.        No dysphoria    Current Outpatient Medications on File Prior to Visit  Medication Sig Dispense Refill   albuterol  ([name] ) (2.5 MG/3ML) 0.083% nebulizer solution Take 3 mLs (2.5 mg total) by nebulization every 6 (six) hours as needed for shortness of breath or wheezing. 120 mL 11  Albuterol -Budesonide  (AIRSUPRA ) 90-80 MCG/ACT AERO Inhale 2 puffs into the lungs every 4 (four) hours as needed. (Patient taking differently: Inhale 2 puffs into the lungs every 4 (four) hours as needed (wheezing / sob).) 10.7 g 5   amiodarone  (PACERONE ) 200 MG tablet Take 200 mg by mouth 2 (two) times daily.     apixaban  (ELIQUIS ) 5 MG TABS tablet Take 1 tablet (5 mg total) by mouth 2 (two) times daily. 180 tablet 3   atorvastatin  (LIPITOR) 40 MG tablet Take 1 tablet by mouth once daily 90 tablet 1   ferrous sulfate  325 (65 FE) MG EC tablet Take 325 mg by mouth daily with breakfast.     Fluticasone -Umeclidin-Vilant (TRELEGY ELLIPTA ) 200-62.5-25 MCG/ACT AEPB Inhale 1  puff into the lungs daily. 180 each 3   furosemide  (LASIX ) 40 MG tablet Take 1 tablet (40 mg total) by mouth daily.     JARDIANCE  25 MG TABS tablet TAKE 1 TABLET BY MOUTH ONCE DAILY BEFORE BREAKFAST 90 tablet 0   levothyroxine  (SYNTHROID ) 75 MCG tablet Take 1 tablet (75 mcg total) by mouth daily. 90 tablet 3   montelukast  (SINGULAIR ) 10 MG tablet TAKE 1 TABLET BY MOUTH ONCE DAILY AT 12 NOON 90 tablet 3   nitroGLYCERIN  (NITROSTAT ) 0.4 MG SL tablet DISSOLVE ONE TABLET UNDER THE TONGUE EVERY 5 MINUTES AS NEEDED FOR CHEST PAIN.  DO NOT EXCEED A TOTAL OF 3 DOSES IN 15 MINUTES 50 tablet 0   OXYGEN  Inhale 3 L into the lungs as needed (shortness of breath).     pantoprazole  (PROTONIX ) 40 MG tablet Take 1 tablet by mouth once daily 90 tablet 1   No current facility-administered medications on file prior to visit.   Past Medical History:  Diagnosis Date   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 11/10/2021   Acquired hypothyroidism 11/10/2021   Acute hypoxic respiratory failure (HCC) 04/25/2022   Acute on chronic diastolic heart failure (HCC) 04/25/2022   Acute on chronic respiratory failure with hypoxia (HCC) 04/25/2022   Acute on chronic systolic heart failure (HCC) 11/10/2021   AKI (acute kidney injury) 06/08/2022   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/10/2021   Alcohol use 09/12/2020   Anemia of chronic disease 04/25/2022   Arthritis    Atherosclerosis of native arteries of extremities with intermittent claudication, bilateral legs 11/10/2021   Bilateral leg edema 02/16/2022   BPH (benign prostatic hyperplasia) 11/10/2021   CHF (congestive heart failure) (HCC)    Chronic diastolic CHF (congestive heart failure) (HCC) 04/25/2022   Chronic hyponatremia 04/25/2022   Chronic respiratory failure with hypoxia (HCC) 05/09/2022   Cigarette smoker 10/21/2014   Class 1 obesity 04/25/2022   Cognitive communication deficit 03/05/2021   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  (HCC)    Diabetic polyneuropathy associated with type 2 diabetes mellitus (HCC) 08/28/2022   Dyspnea    Dysrhythmia    atrial fibrillation   Elevated glucose 05/27/2022   Encounter for prostate cancer screening 02/16/2022   Encounter for screening for lung cancer 02/16/2022   Essential hypertension 10/21/2014   Femur fracture, right (HCC) 09/12/2020   GERD (gastroesophageal reflux disease)    History of kidney stones    Hyperlipidemia 01/24/2017   Hypertension    Hypo-osmolality and hyponatremia 03/05/2021   Hyponatremia 04/26/2022   Hypotension 08/05/2022   Kidney stones 01/24/2017   Laceration of right eyebrow 06/20/2022   Medication monitoring encounter 12/18/2022   Moderate aortic stenosis 2024   Muscle wasting and atrophy, not elsewhere classified, multiple  sites 03/05/2021   Nicotine  dependence, cigarettes, uncomplicated 03/05/2021   Non-seasonal allergic rhinitis due to pollen 06/06/2022   Other abnormalities of gait and mobility 03/05/2021   Other chest pain 12/18/2022   Other lack of coordination 03/05/2021   Oxygen  dependent 08/05/2022   Paroxysmal atrial fibrillation (HCC) 10/21/2014   Pressure injury of skin 09/16/2020   Right ureteral stone 06/08/2022   Sepsis secondary to UTI (HCC) 06/08/2022   SOB (shortness of breath) 05/10/2022   Syncope and collapse 08/05/2022   Tobacco abuse 02/16/2022   Tobacco use disorder 02/16/2022   Type 2 diabetes mellitus (HCC) 06/08/2022   Visit for suture removal 06/15/2022   Past Surgical History:  Procedure Laterality Date   LEFT HEART CATH AND CORONARY ANGIOGRAPHY N/A 09/15/2020   Procedure: LEFT HEART CATH AND CORONARY ANGIOGRAPHY;  Surgeon: Wonda Sharper, MD;  Location: [facility] INVASIVE CV LAB;  Service: Cardiovascular;  Laterality: N/A;   LEG SURGERY Right    steal pin placed in right leg 35 years ago   ORIF FEMUR FRACTURE Right 09/15/2020   Procedure: OPEN REDUCTION INTERNAL FIXATION FEMORAL SHAFT FRACTURE;  Surgeon: Kendal Franky SQUIBB, MD;  Location: [facility] OR;  Service: Orthopedics;  Laterality: Right;    Family History  Problem Relation Age of Onset   Cancer Mother    Diabetes Mother    Breast cancer Sister    Social History   Socioeconomic History   Marital status: Widowed    Spouse name: Not on file   Number of children: 3   Years of education: Not on file   Highest education level: High school graduate  Occupational History   Occupation: retired  Tobacco Use   Smoking status: Every Day    Current packs/day: 1.50    Average packs/day: 1.5 packs/day for 62.0 years (93.0 ttl pk-yrs)    Types: Cigarettes, Cigars   Smokeless tobacco: Never  Vaping Use   Vaping status: Never Used  Substance and Sexual Activity   Alcohol use: Yes    Alcohol/week: 5.0 standard drinks of alcohol    Types: 5 Standard drinks or equivalent per week    Comment: 4-5 beers per day   Drug use: No   Sexual activity: Not Currently  Other Topics Concern   Not on file  Social History Narrative   Not on file   Social Drivers of Health   Financial Resource Strain: Low Risk  (06/21/2023)   Overall Financial Resource Strain (CARDIA)    Difficulty of Paying Living Expenses: Not hard at all  Food Insecurity: No Food Insecurity (10/12/2023)   Hunger Vital Sign    Worried About Running Out of Food in the Last Year: Never true    Ran Out of Food in the Last Year: Never true  Transportation Needs: No Transportation Needs (10/12/2023)   PRAPARE - Administrator, Civil Service (Medical): No    Lack of Transportation (Non-Medical): No  Physical Activity: Inactive (06/21/2023)   Exercise Vital Sign    Days of Exercise per Week: 0 days    Minutes of Exercise per Session: 0 min  Stress: No Stress Concern Present (06/21/2023)   [employer] of Occupational Health - Occupational Stress Questionnaire    Feeling of Stress : Not at all  Social Connections: Moderately Isolated (10/12/2023)   Social Connection and Isolation Panel     Frequency of Communication with Friends and Family: More than three times a week    Frequency of Social Gatherings with Friends and  Family: More than three times a week    Attends Religious Services: More than 4 times per year    Active Member of Clubs or Organizations: No    Attends Banker Meetings: Never    Marital Status: Widowed    Objective:  There were no vitals taken for this visit.     10/14/2023    7:44 AM 10/14/2023    4:00 AM 10/13/2023   11:55 PM  BP/Weight  Systolic BP 116 113 157  Diastolic BP 71 87 65  Wt. (Lbs)  224.6   BMI  35.18 kg/m2     Physical Exam Vitals reviewed.  Constitutional:      Appearance: Normal appearance.  Neck:     Vascular: No carotid bruit.  Cardiovascular:     Rate and Rhythm: Normal rate and regular rhythm.     Heart sounds: Normal heart sounds.  Pulmonary:     Effort: Pulmonary effort is normal.     Breath sounds: Normal breath sounds. No wheezing, rhonchi or rales.  Abdominal:     General: Bowel sounds are normal.     Palpations: Abdomen is soft.     Tenderness: There is no abdominal tenderness.  Neurological:     Mental Status: He is alert and oriented to person, place, and time.  Psychiatric:        Mood and Affect: Mood normal.        Behavior: Behavior normal.     {Perform Simple Foot Exam  Perform Detailed exam:1} {Insert foot Exam (Optional):30965}   Lab Results  Component Value Date   WBC 9.4 10/14/2023   HGB 15.4 10/14/2023   HCT 48.1 10/14/2023   PLT 192 10/14/2023   GLUCOSE 95 10/13/2023   CHOL 156 06/21/2023   TRIG 105 06/21/2023   HDL 59 06/21/2023   LDLCALC 78 06/21/2023   ALT 27 10/08/2023   AST 28 10/08/2023   NA 137 10/13/2023   K 4.2 10/13/2023   CL 98 10/13/2023   CREATININE 1.09 10/13/2023   BUN 26 (H) 10/13/2023   CO2 30 10/13/2023   TSH 5.068 (H) 10/09/2023   HGBA1C 5.9 (H) 10/09/2023    Results for orders placed or performed during the hospital encounter of 10/08/23   CBG monitoring, ED   Collection Time: 10/08/23 11:27 PM  Result Value Ref Range   Glucose-Capillary 197 (H) 70 - 99 mg/dL  I-stat chem 8, ED   Collection Time: 10/08/23 11:33 PM  Result Value Ref Range   Sodium 131 (L) 135 - 145 mmol/L   Potassium 4.5 3.5 - 5.1 mmol/L   Chloride 94 (L) 98 - 111 mmol/L   BUN 22 8 - 23 mg/dL   Creatinine, Ser 8.49 (H) 0.61 - 1.24 mg/dL   Glucose, Bld 831 (H) 70 - 99 mg/dL   Calcium , Ion 1.16 1.15 - 1.40 mmol/L   TCO2 26 22 - 32 mmol/L   Hemoglobin 17.3 (H) 13.0 - 17.0 g/dL   HCT 48.9 60.9 - 47.9 %  I-Stat venous blood gas, ED   Collection Time: 10/08/23 11:33 PM  Result Value Ref Range   pH, Ven 7.178 (LL) 7.25 - 7.43   pCO2, Ven 74.1 (HH) 44 - 60 mmHg   pO2, Ven 58 (H) 32 - 45 mmHg   Bicarbonate 27.6 20.0 - 28.0 mmol/L   TCO2 30 22 - 32 mmol/L   O2 Saturation 81 %   Acid-base deficit 3.0 (H) 0.0 - 2.0 mmol/L   Sodium  129 (L) 135 - 145 mmol/L   Potassium 4.5 3.5 - 5.1 mmol/L   Calcium , Ion 1.19 1.15 - 1.40 mmol/L   HCT 50.0 39.0 - 52.0 %   Hemoglobin 17.0 13.0 - 17.0 g/dL   Sample type VENOUS    Comment NOTIFIED PHYSICIAN   Comprehensive metabolic panel   Collection Time: 10/08/23 11:34 PM  Result Value Ref Range   Sodium 131 (L) 135 - 145 mmol/L   Potassium 4.4 3.5 - 5.1 mmol/L   Chloride 92 (L) 98 - 111 mmol/L   CO2 25 22 - 32 mmol/L   Glucose, Bld 167 (H) 70 - 99 mg/dL   BUN 19 8 - 23 mg/dL   Creatinine, Ser 8.54 (H) 0.61 - 1.24 mg/dL   Calcium  8.8 (L) 8.9 - 10.3 mg/dL   Total Protein 7.3 6.5 - 8.1 g/dL   Albumin  3.4 (L) 3.5 - 5.0 g/dL   AST 28 15 - 41 U/L   ALT 27 0 - 44 U/L   Alkaline Phosphatase 93 38 - 126 U/L   Total Bilirubin 0.6 0.0 - 1.2 mg/dL   GFR, Estimated 48 (L) >60 mL/min   Anion gap 14 5 - 15  Brain natriuretic peptide   Collection Time: 10/08/23 11:34 PM  Result Value Ref Range   B Natriuretic Peptide 686.0 (H) 0.0 - 100.0 pg/mL  CBC with Differential   Collection Time: 10/08/23 11:34 PM  Result Value Ref  Range   WBC 14.7 (H) 4.0 - 10.5 K/uL   RBC 5.02 4.22 - 5.81 MIL/uL   Hemoglobin 15.4 13.0 - 17.0 g/dL   HCT 50.8 60.9 - 47.9 %   MCV 97.8 80.0 - 100.0 fL   MCH 30.7 26.0 - 34.0 pg   MCHC 31.4 30.0 - 36.0 g/dL   RDW 86.5 88.4 - 84.4 %   Platelets 210 150 - 400 K/uL   nRBC 0.0 0.0 - 0.2 %   Neutrophils Relative % 76 %   Neutro Abs 11.3 (H) 1.7 - 7.7 K/uL   Lymphocytes Relative 13 %   Lymphs Abs 1.9 0.7 - 4.0 K/uL   Monocytes Relative 8 %   Monocytes Absolute 1.2 (H) 0.1 - 1.0 K/uL   Eosinophils Relative 1 %   Eosinophils Absolute 0.1 0.0 - 0.5 K/uL   Basophils Relative 1 %   Basophils Absolute 0.1 0.0 - 0.1 K/uL   Immature Granulocytes 1 %   Abs Immature Granulocytes 0.09 (H) 0.00 - 0.07 K/uL  Troponin I (High Sensitivity)   Collection Time: 10/08/23 11:34 PM  Result Value Ref Range   Troponin I (High Sensitivity) 20 (H) <18 ng/L  I-Stat Lactic Acid   Collection Time: 10/08/23 11:36 PM  Result Value Ref Range   Lactic Acid, Venous 3.1 (HH) 0.5 - 1.9 mmol/L   Comment NOTIFIED PHYSICIAN   I-Stat venous blood gas, ED   Collection Time: 10/09/23 12:41 AM  Result Value Ref Range   pH, Ven 7.431 (H) 7.25 - 7.43   pCO2, Ven 35.1 (L) 44 - 60 mmHg   pO2, Ven 146 (H) 32 - 45 mmHg   Bicarbonate 23.3 20.0 - 28.0 mmol/L   TCO2 24 22 - 32 mmol/L   O2 Saturation 99 %   Acid-base deficit 1.0 0.0 - 2.0 mmol/L   Sodium 129 (L) 135 - 145 mmol/L   Potassium 4.1 3.5 - 5.1 mmol/L   Calcium , Ion 0.91 (L) 1.15 - 1.40 mmol/L   HCT 42.0 39.0 - 52.0 %  Hemoglobin 14.3 13.0 - 17.0 g/dL   Sample type VENOUS   T4, free   Collection Time: 10/09/23  1:32 AM  Result Value Ref Range   Free T4 1.07 0.61 - 1.12 ng/dL  TSH   Collection Time: 10/09/23  1:32 AM  Result Value Ref Range   TSH 5.068 (H) 0.350 - 4.500 uIU/mL  Troponin I (High Sensitivity)   Collection Time: 10/09/23  1:32 AM  Result Value Ref Range   Troponin I (High Sensitivity) 21 (H) <18 ng/L  MRSA Next Gen by PCR, Nasal    Collection Time: 10/09/23  1:38 AM   Specimen: Nasal Mucosa; Nasal Swab  Result Value Ref Range   MRSA by PCR Next Gen NOT DETECTED NOT DETECTED  I-Stat Lactic Acid   Collection Time: 10/09/23  1:38 AM  Result Value Ref Range   Lactic Acid, Venous 1.7 0.5 - 1.9 mmol/L  Glucose, capillary   Collection Time: 10/09/23  2:31 AM  Result Value Ref Range   Glucose-Capillary 151 (H) 70 - 99 mg/dL  Hemoglobin J8r   Collection Time: 10/09/23  4:41 AM  Result Value Ref Range   Hgb A1c MFr Bld 5.9 (H) 4.8 - 5.6 %   Mean Plasma Glucose 122.63 mg/dL  CBC   Collection Time: 10/09/23  4:41 AM  Result Value Ref Range   WBC 14.2 (H) 4.0 - 10.5 K/uL   RBC 4.81 4.22 - 5.81 MIL/uL   Hemoglobin 14.8 13.0 - 17.0 g/dL   HCT 54.4 60.9 - 47.9 %   MCV 94.6 80.0 - 100.0 fL   MCH 30.8 26.0 - 34.0 pg   MCHC 32.5 30.0 - 36.0 g/dL   RDW 86.7 88.4 - 84.4 %   Platelets 192 150 - 400 K/uL   nRBC 0.0 0.0 - 0.2 %  Basic metabolic panel   Collection Time: 10/09/23  4:41 AM  Result Value Ref Range   Sodium 129 (L) 135 - 145 mmol/L   Potassium 4.7 3.5 - 5.1 mmol/L   Chloride 92 (L) 98 - 111 mmol/L   CO2 24 22 - 32 mmol/L   Glucose, Bld 154 (H) 70 - 99 mg/dL   BUN 22 8 - 23 mg/dL   Creatinine, Ser 8.47 (H) 0.61 - 1.24 mg/dL   Calcium  8.7 (L) 8.9 - 10.3 mg/dL   GFR, Estimated 45 (L) >60 mL/min   Anion gap 13 5 - 15  Magnesium    Collection Time: 10/09/23  4:41 AM  Result Value Ref Range   Magnesium  2.8 (H) 1.7 - 2.4 mg/dL  Phosphorus   Collection Time: 10/09/23  4:41 AM  Result Value Ref Range   Phosphorus 4.7 (H) 2.5 - 4.6 mg/dL  Lactic acid, plasma   Collection Time: 10/09/23  4:41 AM  Result Value Ref Range   Lactic Acid, Venous 1.7 0.5 - 1.9 mmol/L  Glucose, capillary   Collection Time: 10/09/23  5:41 AM  Result Value Ref Range   Glucose-Capillary 167 (H) 70 - 99 mg/dL  Glucose, capillary   Collection Time: 10/09/23  7:25 AM  Result Value Ref Range   Glucose-Capillary 159 (H) 70 - 99 mg/dL   Glucose, capillary   Collection Time: 10/09/23 11:21 AM  Result Value Ref Range   Glucose-Capillary 165 (H) 70 - 99 mg/dL  Glucose, capillary   Collection Time: 10/09/23  3:25 PM  Result Value Ref Range   Glucose-Capillary 154 (H) 70 - 99 mg/dL  Glucose, capillary   Collection Time: 10/09/23  7:26  PM  Result Value Ref Range   Glucose-Capillary 170 (H) 70 - 99 mg/dL  Glucose, capillary   Collection Time: 10/09/23 11:13 PM  Result Value Ref Range   Glucose-Capillary 146 (H) 70 - 99 mg/dL  Glucose, capillary   Collection Time: 10/10/23  3:21 AM  Result Value Ref Range   Glucose-Capillary 148 (H) 70 - 99 mg/dL  Glucose, capillary   Collection Time: 10/10/23  7:25 AM  Result Value Ref Range   Glucose-Capillary 147 (H) 70 - 99 mg/dL  CBC   Collection Time: 10/10/23  9:44 AM  Result Value Ref Range   WBC 15.4 (H) 4.0 - 10.5 K/uL   RBC 4.73 4.22 - 5.81 MIL/uL   Hemoglobin 14.5 13.0 - 17.0 g/dL   HCT 54.1 60.9 - 47.9 %   MCV 96.8 80.0 - 100.0 fL   MCH 30.7 26.0 - 34.0 pg   MCHC 31.7 30.0 - 36.0 g/dL   RDW 86.5 88.4 - 84.4 %   Platelets 194 150 - 400 K/uL   nRBC 0.0 0.0 - 0.2 %  Basic metabolic panel   Collection Time: 10/10/23  9:44 AM  Result Value Ref Range   Sodium 131 (L) 135 - 145 mmol/L   Potassium 4.5 3.5 - 5.1 mmol/L   Chloride 93 (L) 98 - 111 mmol/L   CO2 26 22 - 32 mmol/L   Glucose, Bld 132 (H) 70 - 99 mg/dL   BUN 32 (H) 8 - 23 mg/dL   Creatinine, Ser 8.46 (H) 0.61 - 1.24 mg/dL   Calcium  8.9 8.9 - 10.3 mg/dL   GFR, Estimated 45 (L) >60 mL/min   Anion gap 12 5 - 15  Magnesium    Collection Time: 10/10/23  9:44 AM  Result Value Ref Range   Magnesium  2.8 (H) 1.7 - 2.4 mg/dL  Glucose, capillary   Collection Time: 10/10/23 11:12 AM  Result Value Ref Range   Glucose-Capillary 131 (H) 70 - 99 mg/dL  ECHOCARDIOGRAM COMPLETE   Collection Time: 10/10/23  1:57 PM  Result Value Ref Range   Weight 3,887.15 oz   Height 67 in   BP 129/57 mmHg   Single Plane A2C EF  62.1 %   Single Plane A4C EF 60.7 %   Calc EF 61.9 %   S' Lateral 4.30 cm   AR max vel 2.04 cm2   AV Area VTI 2.45 cm2   AV Mean grad 16.0 mmHg   AV Peak grad 31.8 mmHg   Ao pk vel 2.82 m/s   Area-P 1/2 3.65 cm2   AV Area mean vel 2.32 cm2   MV VTI 2.68 cm2   Est EF 60 - 65%   Glucose, capillary   Collection Time: 10/10/23  3:20 PM  Result Value Ref Range   Glucose-Capillary 131 (H) 70 - 99 mg/dL  APTT   Collection Time: 10/10/23  5:22 PM  Result Value Ref Range   aPTT 38 (H) 24 - 36 seconds  Heparin  level (unfractionated)   Collection Time: 10/10/23  5:22 PM  Result Value Ref Range   Heparin  Unfractionated >1.10 (H) 0.30 - 0.70 IU/mL  Glucose, capillary   Collection Time: 10/10/23  7:45 PM  Result Value Ref Range   Glucose-Capillary 172 (H) 70 - 99 mg/dL  Glucose, capillary   Collection Time: 10/10/23 11:27 PM  Result Value Ref Range   Glucose-Capillary 172 (H) 70 - 99 mg/dL  Basic metabolic panel   Collection Time: 10/11/23 12:31 AM  Result Value Ref  Range   Sodium 135 135 - 145 mmol/L   Potassium 5.0 3.5 - 5.1 mmol/L   Chloride 96 (L) 98 - 111 mmol/L   CO2 29 22 - 32 mmol/L   Glucose, Bld 137 (H) 70 - 99 mg/dL   BUN 34 (H) 8 - 23 mg/dL   Creatinine, Ser 8.51 (H) 0.61 - 1.24 mg/dL   Calcium  8.6 (L) 8.9 - 10.3 mg/dL   GFR, Estimated 47 (L) >60 mL/min   Anion gap 10 5 - 15  CBC   Collection Time: 10/11/23 12:31 AM  Result Value Ref Range   WBC 11.8 (H) 4.0 - 10.5 K/uL   RBC 4.26 4.22 - 5.81 MIL/uL   Hemoglobin 13.1 13.0 - 17.0 g/dL   HCT 58.6 60.9 - 47.9 %   MCV 96.9 80.0 - 100.0 fL   MCH 30.8 26.0 - 34.0 pg   MCHC 31.7 30.0 - 36.0 g/dL   RDW 86.3 88.4 - 84.4 %   Platelets 159 150 - 400 K/uL   nRBC 0.0 0.0 - 0.2 %  APTT   Collection Time: 10/11/23 12:32 AM  Result Value Ref Range   aPTT 51 (H) 24 - 36 seconds  Heparin  level (unfractionated)   Collection Time: 10/11/23 12:32 AM  Result Value Ref Range   Heparin  Unfractionated >1.10 (H) 0.30 - 0.70  IU/mL  Glucose, capillary   Collection Time: 10/11/23  3:25 AM  Result Value Ref Range   Glucose-Capillary 134 (H) 70 - 99 mg/dL  Glucose, capillary   Collection Time: 10/11/23  7:42 AM  Result Value Ref Range   Glucose-Capillary 115 (H) 70 - 99 mg/dL  CBC   Collection Time: 10/12/23  5:20 AM  Result Value Ref Range   WBC 9.0 4.0 - 10.5 K/uL   RBC 4.57 4.22 - 5.81 MIL/uL   Hemoglobin 14.1 13.0 - 17.0 g/dL   HCT 55.8 60.9 - 47.9 %   MCV 96.5 80.0 - 100.0 fL   MCH 30.9 26.0 - 34.0 pg   MCHC 32.0 30.0 - 36.0 g/dL   RDW 86.0 88.4 - 84.4 %   Platelets 169 150 - 400 K/uL   nRBC 0.0 0.0 - 0.2 %  Basic metabolic panel   Collection Time: 10/12/23 10:16 AM  Result Value Ref Range   Sodium 137 135 - 145 mmol/L   Potassium 4.0 3.5 - 5.1 mmol/L   Chloride 96 (L) 98 - 111 mmol/L   CO2 30 22 - 32 mmol/L   Glucose, Bld 158 (H) 70 - 99 mg/dL   BUN 30 (H) 8 - 23 mg/dL   Creatinine, Ser 8.76 0.61 - 1.24 mg/dL   Calcium  8.9 8.9 - 10.3 mg/dL   GFR, Estimated 59 (L) >60 mL/min   Anion gap 11 5 - 15  CBC   Collection Time: 10/13/23  3:56 AM  Result Value Ref Range   WBC 8.7 4.0 - 10.5 K/uL   RBC 5.00 4.22 - 5.81 MIL/uL   Hemoglobin 15.4 13.0 - 17.0 g/dL   HCT 52.3 60.9 - 47.9 %   MCV 95.2 80.0 - 100.0 fL   MCH 30.8 26.0 - 34.0 pg   MCHC 32.4 30.0 - 36.0 g/dL   RDW 86.1 88.4 - 84.4 %   Platelets 170 150 - 400 K/uL   nRBC 0.0 0.0 - 0.2 %  Basic metabolic panel with GFR   Collection Time: 10/13/23  3:56 AM  Result Value Ref Range   Sodium 137 135 - 145  mmol/L   Potassium 4.2 3.5 - 5.1 mmol/L   Chloride 98 98 - 111 mmol/L   CO2 30 22 - 32 mmol/L   Glucose, Bld 95 70 - 99 mg/dL   BUN 26 (H) 8 - 23 mg/dL   Creatinine, Ser 8.90 0.61 - 1.24 mg/dL   Calcium  9.0 8.9 - 10.3 mg/dL   GFR, Estimated >39 >39 mL/min   Anion gap 9 5 - 15  CBC   Collection Time: 10/14/23  4:38 AM  Result Value Ref Range   WBC 9.4 4.0 - 10.5 K/uL   RBC 5.02 4.22 - 5.81 MIL/uL   Hemoglobin 15.4 13.0 - 17.0  g/dL   HCT 51.8 60.9 - 47.9 %   MCV 95.8 80.0 - 100.0 fL   MCH 30.7 26.0 - 34.0 pg   MCHC 32.0 30.0 - 36.0 g/dL   RDW 86.0 88.4 - 84.4 %   Platelets 192 150 - 400 K/uL   nRBC 0.0 0.0 - 0.2 %  .  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  Assessment and Plan Assessment & Plan      No orders of the defined types were placed in this encounter.   No orders of the defined types were placed in this encounter.      Follow-up: No follow-ups on file.  An After Visit Summary was printed and given to the patient.  Abigail Free, MD [facility] [phone]

## 2023-10-30 NOTE — Assessment & Plan Note (Signed)
 SABRA

## 2023-10-31 ENCOUNTER — Encounter: Payer: Self-pay | Admitting: Family Medicine

## 2023-10-31 ENCOUNTER — Ambulatory Visit (INDEPENDENT_AMBULATORY_CARE_PROVIDER_SITE_OTHER): Admitting: Family Medicine

## 2023-10-31 VITALS — BP 130/66 | HR 79 | Temp 98.1°F | Ht 67.0 in | Wt 228.0 lb

## 2023-10-31 DIAGNOSIS — Z23 Encounter for immunization: Secondary | ICD-10-CM

## 2023-10-31 DIAGNOSIS — Z7951 Long term (current) use of inhaled steroids: Secondary | ICD-10-CM

## 2023-10-31 DIAGNOSIS — I498 Other specified cardiac arrhythmias: Secondary | ICD-10-CM

## 2023-10-31 DIAGNOSIS — I480 Paroxysmal atrial fibrillation: Secondary | ICD-10-CM

## 2023-10-31 DIAGNOSIS — E782 Mixed hyperlipidemia: Secondary | ICD-10-CM

## 2023-10-31 DIAGNOSIS — J449 Chronic obstructive pulmonary disease, unspecified: Secondary | ICD-10-CM

## 2023-10-31 DIAGNOSIS — E039 Hypothyroidism, unspecified: Secondary | ICD-10-CM | POA: Diagnosis not present

## 2023-10-31 DIAGNOSIS — I10 Essential (primary) hypertension: Secondary | ICD-10-CM

## 2023-10-31 NOTE — Patient Instructions (Signed)
  VISIT SUMMARY: You had a follow-up visit after your recent hospitalization for COPD exacerbation and heart rhythm issues. Your breathing has improved, and your heart rhythm is stable. We discussed early intervention to prevent future hospitalizations and provided contact information for urgent care needs.  YOUR PLAN: CHRONIC OBSTRUCTIVE PULMONARY DISEASE (COPD) EXACERBATION: You were recently hospitalized for a COPD exacerbation, but your breathing has improved with oxygen  levels now in the 90s. -We discussed the importance of early intervention to prevent hospitalizations. -You were provided with after-hours contact information for urgent care. -Please call our office if your symptoms worsen and request Dr. Sherre or a nurse.  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 -Continue taking Eliquis  and amiodarone  as prescribed. -You have a follow-up appointment scheduled with Dr. Lesia for rhythm management.  ESSENTIAL HYPERTENSION: Your blood pressure is well-managed without the need for metoprolol . -No changes to your current blood pressure management plan.  GENERAL HEALTH MAINTENANCE: You are due for a flu shot and a COVID-19 booster. You recently had an eye exam and received new glasses. -We administered your flu shot today. -We encourage you to get a COVID-19 booster due to your respiratory condition. -Please schedule your Medicare annual wellness visit. -We will check your thyroid  blood work.                      Contains text generated by Abridge.                                 Contains text generated by Abridge.

## 2023-10-31 NOTE — Assessment & Plan Note (Addendum)
 Recheck levels. Orders:   T4, free   TSH

## 2023-11-01 ENCOUNTER — Ambulatory Visit: Payer: Self-pay | Admitting: Family Medicine

## 2023-11-01 ENCOUNTER — Ambulatory Visit: Attending: Student | Admitting: Student

## 2023-11-01 ENCOUNTER — Encounter: Payer: Self-pay | Admitting: Student

## 2023-11-01 VITALS — BP 134/66 | HR 78 | Ht 67.0 in | Wt 228.6 lb

## 2023-11-01 DIAGNOSIS — J449 Chronic obstructive pulmonary disease, unspecified: Secondary | ICD-10-CM

## 2023-11-01 DIAGNOSIS — I480 Paroxysmal atrial fibrillation: Secondary | ICD-10-CM | POA: Diagnosis not present

## 2023-11-01 DIAGNOSIS — I2510 Atherosclerotic heart disease of native coronary artery without angina pectoris: Secondary | ICD-10-CM

## 2023-11-01 DIAGNOSIS — R001 Bradycardia, unspecified: Secondary | ICD-10-CM | POA: Diagnosis not present

## 2023-11-01 DIAGNOSIS — I5032 Chronic diastolic (congestive) heart failure: Secondary | ICD-10-CM | POA: Diagnosis not present

## 2023-11-01 LAB — T4, FREE: Free T4: 1.49 ng/dL (ref 0.82–1.77)

## 2023-11-01 LAB — TSH: TSH: 2.9 u[IU]/mL (ref 0.450–4.500)

## 2023-11-01 MED ORDER — AMIODARONE HCL 200 MG PO TABS: 200.0000 mg | ORAL_TABLET | Freq: Every day | ORAL | Status: AC

## 2023-11-01 NOTE — Patient Instructions (Signed)
 Medication Instructions:  Decrease amiodarone  to 200 mg daily *If you need a refill on your cardiac medications before your next appointment, please call your pharmacy*  Lab Work: None ordered If you have labs (blood work) drawn today and your tests are completely normal, you will receive your results only by: MyChart Message (if you have MyChart) OR A paper copy in the mail If you have any lab test that is abnormal or we need to change your treatment, we will call you to review the results.  Follow-Up: At [facility], you and your health needs are our priority.  As part of our continuing mission to provide you with exceptional heart care, our providers are all part of one team.  This team includes your primary Cardiologist (physician) and Advanced Practice Providers or APPs (Physician Assistants and Nurse Practitioners) who all work together to provide you with the care you need, when you need it.  Your next appointment:   As needed with EP   Schedule follow up with Dr Monetta, February 2026 per recall

## 2023-11-01 NOTE — Progress Notes (Signed)
  Electrophysiology Office Note:   Date:  11/01/2023  ID:  Farley ROSIAK, DOB October 04, 1940, MRN [id number]  Primary Cardiologist: Redell Leiter, MD Electrophysiologist: None   Cardiology APP:  Carlin Delon BROCKS, NP      History of Present Illness:   Kyle Farley is a 83 y.o. male with h/o  hypertension, diabetes, atrial fibrillation, nonobstructive coronary artery disease, moderate aortic stenosis, diastolic heart failure, COPD, active tobacco seen today for post hospital follow up.    Admitted 9/20 - 9/26 for acute COPD exacerbation; noted to be in junctional rhythm and taken to ICU on dopamine . HRs gradually improved with BB and amio wash out.  His COPD was treated, and moved to the floor. He had recurrent AF with RVR and was resumed on po amiodarone  by IM team. No further bradycardia.   Since discharge from hospital the patient reports doing well. His O2 sats run upper 80s with activity on 3L of O2. Sometimes he will just do without, and has pulse ox in the 70s. Otherwise, he denies chest pain, palpitations, PND, orthopnea, nausea, vomiting, dizziness, syncope, edema, weight gain, or early satiety.   Review of systems complete and found to be negative unless listed in HPI.   EP Information / Studies Reviewed:    EKG is ordered today. Personal review as below.  EKG Interpretation Date/Time:  Tuesday November 01 2023 10:28:20 EDT Ventricular Rate:  78 PR Interval:    QRS Duration:  136 QT Interval:  424 QTC Calculation: 483 R Axis:   -69  Text Interpretation: Wide QRS rhythm Left axis deviation Right bundle branch block When compared with ECG of 09-Oct-2023 00:23, PREVIOUS ECG IS PRESENT Confirmed by Lesia Sharper [phone]) on 11/01/2023 10:31:06 AM Also confirmed by Lesia Sharper [phone])  on 11/01/2023 10:32:01 AM    Arrhythmia/Device History No specialty comments available.   Physical Exam:   VS:  BP 134/66   Pulse 78   Ht 5' 7 (1.702 m)   Wt 228 lb 9.6 oz (103.7 kg)    SpO2 (!) 86% Comment: Pt declined offer of office supplemental O2, has his own.  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 Decrease amiodarone  back to 200 mg daily Not ablation candidate.  Bradycardia  Resolved off BB.  Would not rechallenge   Chronic hypoxic respiratory failure Encouraged appropriate use of his O2.    Follow up with EP Team as needed, if Dr. Leiter willing to continue to manage his amiodarone . Otherwise, we will be happy to see q 6 months for EP needs.  Pt prefers to be seen in [place].  Signed, Sharper Prentice Lesia, PA-C

## 2023-11-03 ENCOUNTER — Other Ambulatory Visit: Payer: Self-pay | Admitting: Family Medicine

## 2023-11-07 ENCOUNTER — Ambulatory Visit (INDEPENDENT_AMBULATORY_CARE_PROVIDER_SITE_OTHER)

## 2023-11-07 VITALS — Ht 67.0 in | Wt 221.0 lb

## 2023-11-07 DIAGNOSIS — Z0000 Encounter for general adult medical examination without abnormal findings: Secondary | ICD-10-CM

## 2023-11-07 NOTE — Progress Notes (Signed)
 Subjective:   Kyle Farley is a 83 y.o. male who presents for Medicare Annual/Subsequent preventive examination.  Visit Complete: In person  Cardiac Risk Factors include: advanced age (>63men, >39 women);hypertension;diabetes mellitus;smoking/ tobacco exposure;dyslipidemia;male gender;obesity (BMI >30kg/m2)     Objective:    Today's Vitals   11/07/23 0949  Weight: 221 lb (100.2 kg)  Height: 5' 7 (1.702 m)   Body mass index is 34.61 kg/m.     11/07/2023    9:55 AM 10/09/2023    1:06 PM 10/20/2022    9:15 AM 04/25/2022    2:00 AM 04/24/2022   11:21 PM 09/15/2020   10:49 AM 09/12/2020   10:35 PM  Advanced Directives  Does Patient Have a Medical Advance Directive? Yes No Yes No No Yes No  Type of Estate agent of [place];Living will;Out of facility DNR (pink MOST or yellow form)  Healthcare Power of [place];Living will   Healthcare Power of Attorney   Does patient want to make changes to medical advance directive?   No - Patient declined No - Patient declined Yes (ED - Information included in AVS) No - Patient declined   Copy of Healthcare Power of Attorney in Chart? No - copy requested  No - copy requested   No - copy requested   Would patient like information on creating a medical advance directive?  No - Patient declined  No - Patient declined   No - Patient declined    Current Medications (verified) Outpatient Encounter Medications as of 11/07/2023  Medication Sig   albuterol  ([name] ) (2.5 MG/3ML) 0.083% nebulizer solution Take 3 mLs (2.5 mg total) by nebulization every 6 (six) hours as needed for shortness of breath or wheezing.   Albuterol -Budesonide  (AIRSUPRA ) 90-80 MCG/ACT AERO Inhale 2 puffs into the lungs every 4 (four) hours as needed. (Patient taking differently: Inhale 2 puffs into the lungs every 4 (four) hours as needed (wheezing / sob).)   amiodarone  (PACERONE ) 200 MG tablet Take 1 tablet (200 mg total) by mouth daily.   apixaban   (ELIQUIS ) 5 MG TABS tablet Take 1 tablet (5 mg total) by mouth 2 (two) times daily.   atorvastatin  (LIPITOR) 40 MG tablet Take 1 tablet by mouth once daily   Fluticasone -Umeclidin-Vilant (TRELEGY ELLIPTA ) 200-62.5-25 MCG/ACT AEPB Inhale 1 puff into the lungs daily.   furosemide  (LASIX ) 40 MG tablet Take 1 tablet (40 mg total) by mouth daily.   JARDIANCE  25 MG TABS tablet TAKE 1 TABLET BY MOUTH ONCE DAILY BEFORE BREAKFAST   levothyroxine  (SYNTHROID ) 75 MCG tablet Take 1 tablet (75 mcg total) by mouth daily.   Magnesium  400 MG CAPS Take by mouth.   montelukast  (SINGULAIR ) 10 MG tablet TAKE 1 TABLET BY MOUTH ONCE DAILY AT 12 NOON   nitroGLYCERIN  (NITROSTAT ) 0.4 MG SL tablet DISSOLVE ONE TABLET UNDER THE TONGUE EVERY 5 MINUTES AS NEEDED FOR CHEST PAIN.  DO NOT EXCEED A TOTAL OF 3 DOSES IN 15 MINUTES   OXYGEN  Inhale 3 L into the lungs as needed (shortness of breath).   pantoprazole  (PROTONIX ) 40 MG tablet Take 1 tablet by mouth once daily   ferrous sulfate  325 (65 FE) MG EC tablet Take 325 mg by mouth daily with breakfast. (Patient not taking: Reported on 11/07/2023)   No facility-administered encounter medications on file as of 11/07/2023.    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   Alcohol use 09/12/2020   Anemia of chronic disease 04/25/2022   Arthritis    Atherosclerosis of native arteries of extremities with intermittent claudication, bilateral legs 11/10/2021   Bilateral leg edema 02/16/2022   BPH (benign  prostatic hyperplasia) 11/10/2021   CHF (congestive heart failure) (HCC)    Chronic diastolic CHF (congestive heart failure) (HCC) 04/25/2022   Chronic hyponatremia 04/25/2022   Chronic respiratory failure with hypoxia (HCC) 05/09/2022   Cigarette smoker 10/21/2014   Class 1 obesity 04/25/2022   Cognitive communication deficit 03/05/2021   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)    Diabetic polyneuropathy associated with type 2 diabetes mellitus (HCC) 08/28/2022   Dyspnea    Dysrhythmia    atrial fibrillation   Elevated glucose 05/27/2022   Encounter for prostate cancer screening 02/16/2022   Encounter for screening for lung cancer 02/16/2022   Essential hypertension 10/21/2014   Femur fracture, right (HCC) 09/12/2020   GERD (gastroesophageal reflux disease)    History of kidney stones    Hyperlipidemia 01/24/2017   Hypertension    Hypo-osmolality and hyponatremia 03/05/2021   Hyponatremia 04/26/2022   Hypotension 08/05/2022   Kidney stones 01/24/2017   Laceration of right eyebrow 06/20/2022   Medication monitoring encounter 12/18/2022   Moderate aortic stenosis 2024   Muscle wasting and atrophy, not elsewhere classified, multiple sites 03/05/2021   Nicotine  dependence, cigarettes, uncomplicated 03/05/2021   Non-seasonal allergic rhinitis due to pollen 06/06/2022   Other abnormalities of gait and mobility 03/05/2021   Other chest pain 12/18/2022   Other lack of coordination 03/05/2021   Oxygen  dependent 08/05/2022   Paroxysmal atrial fibrillation (HCC) 10/21/2014   Pressure injury of skin 09/16/2020   Right ureteral stone 06/08/2022   Sepsis secondary to UTI (HCC) 06/08/2022   SOB (shortness of breath) 05/10/2022   Syncope and collapse 08/05/2022   Tobacco abuse 02/16/2022   Tobacco use disorder 02/16/2022   Type 2 diabetes mellitus (HCC) 06/08/2022   Visit for suture removal 06/15/2022   Past Surgical History:  Procedure Laterality Date   LEFT HEART CATH AND  CORONARY ANGIOGRAPHY N/A 09/15/2020   Procedure: LEFT HEART CATH AND CORONARY ANGIOGRAPHY;  Surgeon: Wonda Sharper, MD;  Location: [facility] INVASIVE CV LAB;  Service: Cardiovascular;  Laterality: N/A;   LEG SURGERY Right    steal pin placed in right leg 35 years ago   ORIF FEMUR FRACTURE Right 09/15/2020   Procedure: OPEN REDUCTION INTERNAL FIXATION FEMORAL SHAFT FRACTURE;  Surgeon: Kendal Franky SQUIBB, MD;  Location: [facility] OR;  Service: Orthopedics;  Laterality: Right;   Family History  Problem Relation Age of Onset   Cancer Mother    Diabetes Mother    Breast cancer Sister    Social History   Socioeconomic History   Marital status: Widowed    Spouse name: Not on file   Number of children: 3   Years of education: Not on file   Highest education level: High school graduate  Occupational History   Occupation: retired  Tobacco Use   Smoking status: Every Day    Current packs/day: 1.50    Average packs/day: 1.5 packs/day for 62.0 years (93.0 ttl pk-yrs)    Types:  Cigarettes, Cigars   Smokeless tobacco: Never  Vaping Use   Vaping status: Never Used  Substance and Sexual Activity   Alcohol use: Yes    Alcohol/week: 5.0 standard drinks of alcohol    Types: 5 Standard drinks or equivalent per week    Comment: 4-5 beers per day   Drug use: No   Sexual activity: Not Currently  Other Topics Concern   Not on file  Social History Narrative   Not on file   Social Drivers of Health   Financial Resource Strain: Low Risk  (06/21/2023)   Overall Financial Resource Strain (CARDIA)    Difficulty of Paying Living Expenses: Not hard at all  Food Insecurity: No Food Insecurity (10/12/2023)   Hunger Vital Sign    Worried About Running Out of Food in the Last Year: Never true    Ran Out of Food in the Last Year: Never true  Transportation Needs: No Transportation Needs (10/12/2023)   PRAPARE - Administrator, Civil Service (Medical): No    Lack of Transportation (Non-Medical): No  Physical  Activity: Inactive (06/21/2023)   Exercise Vital Sign    Days of Exercise per Week: 0 days    Minutes of Exercise per Session: 0 min  Stress: No Stress Concern Present (06/21/2023)   [employer] of Occupational Health - Occupational Stress Questionnaire    Feeling of Stress : Not at all  Social Connections: Moderately Isolated (10/12/2023)   Social Connection and Isolation Panel    Frequency of Communication with Friends and Family: More than three times a week    Frequency of Social Gatherings with Friends and Family: More than three times a week    Attends Religious Services: More than 4 times per year    Active Member of [employer] or Organizations: No    Attends Banker Meetings: Never    Marital Status: Widowed    Tobacco Counseling Ready to quit: Not Answered Counseling given: Not Answered   Clinical Intake:  Pre-visit preparation completed: No  Pain : No/denies pain     BMI - recorded: 34.61 Nutritional Status: BMI > 30  Obese Nutritional Risks: None Diabetes: Yes CBG done?: No Did pt. bring in CBG monitor from home?: No  How often do you need to have someone help you when you read instructions, pamphlets, or other written materials from your doctor or pharmacy?: 1 - Never What is the last grade level you completed in school?: GED  Interpreter Needed?: No      Activities of Daily Living    11/07/2023    9:56 AM 10/09/2023    1:05 PM  In your present state of health, do you have any difficulty performing the following activities:  Hearing? 1 0  Comment right ear   Vision? 1 0  Comment wears glasses   Difficulty concentrating or making decisions? 0 0  Walking or climbing stairs? 1   Comment has walker   Dressing or bathing? 0   Doing errands, shopping? 0   Preparing Food and eating ? N   Using the Toilet? N   In the past six months, have you accidently leaked urine? N   Do you have problems with loss of bowel control? N   Managing your  Medications? N   Managing your Finances? N   Housekeeping or managing your Housekeeping? N     Patient Care Team: Sherre Clapper, MD as PCP - General (Family Medicine) Monetta Redell PARAS, MD as  PCP - Cardiology (Cardiology) Inocencio Soyla Lunger, MD as Consulting Physician (Cardiology) Monetta Redell PARAS, MD as Consulting Physician (Cardiology) Carlin Delon BROCKS, NP as Nurse Practitioner (Cardiology) Wallace, Juana M, RN as VBCI Care Management  Indicate any recent Medical Services you may have received from other than [facility] providers in the past year (date may be approximate).     Assessment:   This is a routine wellness examination for Kyle Farley.  Hearing/Vision screen No results found.   Goals Addressed   None    Depression Screen    11/07/2023    9:55 AM 06/21/2023   10:19 AM 10/20/2022    9:17 AM 08/05/2022   11:08 AM 05/27/2022    4:12 PM 04/02/2022   11:24 AM 04/02/2022   10:41 AM  PHQ 2/9 Scores  PHQ - 2 Score 0 0 0 0 0 0 0  PHQ- 9 Score 1   0 7      Fall Risk    11/07/2023    9:55 AM 08/08/2023   12:37 PM 07/27/2023    9:45 AM 06/29/2023   10:33 AM 06/15/2023   11:47 AM  Fall Risk   Falls in the past year? 1 0 0 0 0  Number falls in past yr: 0      Injury with Fall? 0      Risk for fall due to : History of fall(s)      Follow up Falls evaluation completed        MEDICARE RISK AT HOME: Medicare Risk at Home Any stairs in or around the home?: Yes If so, are there any without handrails?: No Home free of loose throw rugs in walkways, pet beds, electrical cords, etc?: Yes Adequate lighting in your home to reduce risk of falls?: Yes Life alert?: No Use of a cane, walker or w/c?: Yes (walker) Grab bars in the bathroom?: No Shower chair or bench in shower?: Yes Elevated toilet seat or a handicapped toilet?: Yes    Cognitive Function:        11/07/2023    9:58 AM  6CIT Screen  What month? 0 points  What time? 0 points  Count back from 20 0 points  Months in  reverse 2 points  Repeat phrase 4 points    Immunizations Immunization History  Administered Date(s) Administered   Fluad Trivalent(High Dose 65+) 10/20/2022   INFLUENZA, HIGH DOSE SEASONAL PF 10/31/2023   Influenza Split 10/23/2013   Influenza, Quadrivalent, Recombinant, Inj, Pf 10/19/2017, 11/15/2018, 12/11/2019   Influenza-Unspecified 10/27/2021   PFIZER(Purple Top)SARS-COV-2 Vaccination 04/19/2019, 05/14/2019   PNEUMOCOCCAL CONJUGATE-20 11/10/2021   Pneumococcal Polysaccharide-23 12/23/2018   Tdap 06/10/2022    TDAP status: Up to date  Flu Vaccine status: Up to date  Pneumococcal vaccine status: Up to date  Covid-19 vaccine status: Completed vaccines  Qualifies for Shingles Vaccine? Yes   Zostavax completed Yes   Shingrix Completed?: Yes  Screening Tests Health Maintenance  Topic Date Due   OPHTHALMOLOGY EXAM  Never done   Zoster Vaccines- Shingrix (1 of 2) Never done   FOOT EXAM  12/15/2023   HEMOGLOBIN A1C  04/07/2024   Diabetic kidney evaluation - Urine ACR  06/20/2024   Diabetic kidney evaluation - eGFR measurement  10/12/2024   Medicare Annual Wellness (AWV)  11/06/2024   DTaP/Tdap/Td (2 - Td or Tdap) 06/09/2032   Pneumococcal Vaccine: 50+ Years  Completed   Influenza Vaccine  Completed   Meningococcal B Vaccine  Aged Out   COVID-19 Vaccine  Discontinued    Health Maintenance  Health Maintenance Due  Topic Date Due   OPHTHALMOLOGY EXAM  Never done   Zoster Vaccines- Shingrix (1 of 2) Never done    Colorectal cancer screening: No longer required.    Additional Screening:  Hepatitis C Screening: does not qualify.  Vision Screening: Recommended annual ophthalmology exams for early detection of glaucoma and other disorders of the eye. Is the patient up to date with their annual eye exam?  Yes  Who is the provider or what is the name of the office in which the patient attends annual eye exams? [facility]   Dental Screening: Recommended  annual dental exams for proper oral hygiene  Diabetic Foot Exam: Diabetic Foot Exam: Completed 12.02.2025  Community Resource Referral / Chronic Care Management: CRR required this visit?  No   CCM required this visit?  No     Plan:     I have personally reviewed and noted the following in the patient's chart:   Medical and social history Use of alcohol, tobacco or illicit drugs  Current medications and supplements including opioid prescriptions. Patient is not currently taking opioid prescriptions. Functional ability and status Nutritional status Physical activity Advanced directives List of other physicians Hospitalizations, surgeries, and ER visits in previous 12 months Vitals Screenings to include cognitive, depression, and falls Referrals and appointments  In addition, I have reviewed and discussed with patient certain preventive protocols, quality metrics, and best practice recommendations. A written personalized care plan for preventive services as well as general preventive health recommendations were provided to patient.     Koy Lamp [place], [place]   11/07/2023   After Visit Summary: (Declined) Due to this being a telephonic visit, with patients personalized plan was offered to patient but patient Declined AVS at this time   Nurse Notes: I spent 16 minutes on the phone with this patient. Patient has no questions or concerns at this time.

## 2023-11-07 NOTE — Patient Instructions (Addendum)
 [name] , Thank you for taking time to come for your Medicare Wellness Visit. I appreciate your ongoing commitment to your health goals. Please review the following plan we discussed and let me know if I can assist you in the future.   These are the goals we discussed:  Goals      VBCI RN Care Plan     Problems:  Chronic Disease Management support and education needs related to CHF, COPD, and DMII  Goal: Over the next 30 days the Patient will attend all scheduled medical appointments:  09/09/23 CVD and 09/11/23 PCP  as evidenced by chart review and patient reporting        continue to work with RN Care Manager and/or Social Worker to address care management and care coordination needs related to CHF, COPD, and DMII as evidenced by adherence to care management team scheduled appointments     demonstrate a decrease CHF, COPD, and DMII in exacerbations as evidenced by no hospital readmissions demonstrate Ongoing adherence to prescribed treatment plan for CHF, COPD, and DMII as evidenced by taking all medications as prescribed, attending all medical appointments, lifestyle and diet changes, considering smoking cessation take all medications exactly as prescribed and will call provider for medication related questions as evidenced by chart review and patient report    work with pharmacist to address medication adherence and lack of education about medication instructions related to CHF, COPD, and DMII as evidenced by review of electronic medical record and patient or pharmacist report     Interventions:   Heart Failure Interventions: Basic overview and discussion of pathophysiology of Heart Failure reviewed Provided education on low sodium diet Assessed need for readable accurate scales in home Provided education about placing scale on hard, flat surface Advised patient to weigh each morning after emptying bladder Discussed importance of daily weight and advised patient to weigh and record  daily Reviewed role of diuretics in prevention of fluid overload and management of heart failure; Discussed the importance of keeping all appointments with provider Assisted patient to reschedule cancelled cardiology appointment for 09/09/23.   Message sent to provider regarding 18 lb weight gain, increased edema in LE, BP of 153/63  COPD Interventions: Advised patient to track and manage COPD triggers Provided instruction about proper use of medications used for management of COPD including inhalers Provided education about and advised patient to utilize infection prevention strategies to reduce risk of respiratory infection Discussed the importance of adequate rest and management of fatigue with COPD Screening for signs and symptoms of depression related to chronic disease state       Diabetes Interventions: Assessed patient's understanding of A1c goal: <6.5% Reviewed medications with patient and discussed importance of medication adherence Counseled on importance of regular laboratory monitoring as prescribed Discussed plans with patient for ongoing care management follow up and provided patient with direct contact information for care management team Referral made to pharmacy team for assistance with polypharmacy Review of patient status, including review of consultants reports, relevant laboratory and other test results, and medications completed Assessed social determinant of health barriers Lab Results  Component Value Date   HGBA1C 6.0 (H) 06/21/2023    Patient Self-Care Activities:  Attend all scheduled provider appointments Attend church or other social activities Call pharmacy for medication refills 3-7 days in advance of running out of medications Call provider office for new concerns or questions  Perform all self care activities independently  Take medications as prescribed   Work with the pharmacist  to address medication management needs and will continue to work with  the clinical team to address health care and disease management related needs call office if I gain more than 2 pounds in one day or 5 pounds in one week keep legs up while sitting track weight in diary use salt in moderation watch for swelling in feet, ankles and legs every day weigh myself daily begin a heart failure diary bring diary to all appointments eliminate smoking in my home eliminate symptom triggers at home follow rescue plan if symptoms flare-up keep follow-up appointments: needs appointment get at least 7 to 8 hours of sleep at night use devices that will help like a cane, sock-puller or reacher Schedule followup visit with pulmonology  Plan:  Telephone follow up appointment with care management team member scheduled for:    09/11/23 at 1:00             This is a list of the screening recommended for you and due dates:  Health Maintenance  Topic Date Due   Eye exam for diabetics  Never done   Zoster (Shingles) Vaccine (1 of 2) Never done   Complete foot exam   12/15/2023   Hemoglobin A1C  04/07/2024   Yearly kidney health urinalysis for diabetes  06/20/2024   Yearly kidney function blood test for diabetes  10/12/2024   Medicare Annual Wellness Visit  11/06/2024   DTaP/Tdap/Td vaccine (2 - Td or Tdap) 06/09/2032   Pneumococcal Vaccine for age over 63  Completed   Flu Shot  Completed   Meningitis B Vaccine  Aged Out   COVID-19 Vaccine  Discontinued    Advanced directives: yes  Next appointment: Follow up in one year for your annual wellness visit. 10.21.2026 at 1000.  Preventive Care 46 Years and Older, Male  Preventive care refers to lifestyle choices and visits with your health care provider that can promote health and wellness. What does preventive care include? A yearly physical exam. This is also called an annual well check. Dental exams once or twice a year. Routine eye exams. Ask your health care provider how often you should have your eyes  checked. Personal lifestyle choices, including: Daily care of your teeth and gums. Regular physical activity. Eating a healthy diet. Avoiding tobacco and drug use. Limiting alcohol use. Practicing safe sex. Taking low doses of aspirin  every day. Taking vitamin and mineral supplements as recommended by your health care provider. What happens during an annual well check? The services and screenings done by your health care provider during your annual well check will depend on your age, overall health, lifestyle risk factors, and family history of disease. Counseling  Your health care provider may ask you questions about your: Alcohol use. Tobacco use. Drug use. Emotional well-being. Home and relationship well-being. Sexual activity. Eating habits. History of falls. Memory and ability to understand (cognition). Work and work Astronomer. Screening  You may have the following tests or measurements: Height, weight, and BMI. Blood pressure. Lipid and cholesterol levels. These may be checked every 5 years, or more frequently if you are over 52 years old. Skin check. Lung cancer screening. You may have this screening every year starting at age 7 if you have a 30-pack-year history of smoking and currently smoke or have quit within the past 15 years. Fecal occult blood test (FOBT) of the stool. You may have this test every year starting at age 4. Flexible sigmoidoscopy or colonoscopy. You may have a sigmoidoscopy every 5 years  or a colonoscopy every 10 years starting at age 40. Prostate cancer screening. Recommendations will vary depending on your family history and other risks. Hepatitis C blood test. Hepatitis B blood test. Sexually transmitted disease (STD) testing. Diabetes screening. This is done by checking your blood sugar (glucose) after you have not eaten for a while (fasting). You may have this done every 1-3 years. Abdominal aortic aneurysm (AAA) screening. You may need this  if you are a current or former smoker. Osteoporosis. You may be screened starting at age 13 if you are at high risk. Talk with your health care provider about your test results, treatment options, and if necessary, the need for more tests. Vaccines  Your health care provider may recommend certain vaccines, such as: Influenza vaccine. This is recommended every year. Tetanus, diphtheria, and acellular pertussis (Tdap, Td) vaccine. You may need a Td booster every 10 years. Zoster vaccine. You may need this after age 41. Pneumococcal 13-valent conjugate (PCV13) vaccine. One dose is recommended after age 57. Pneumococcal polysaccharide (PPSV23) vaccine. One dose is recommended after age 39. Talk to your health care provider about which screenings and vaccines you need and how often you need them. This information is not intended to replace advice given to you by your health care provider. Make sure you discuss any questions you have with your health care provider. Document Released: 01/31/2015 Document Revised: 09/24/2015 Document Reviewed: 11/05/2014 Elsevier Interactive Patient Education  2017 [employer].  Fall Prevention in the Home Falls can cause injuries. They can happen to people of all ages. There are many things you can do to make your home safe and to help prevent falls. What can I do on the outside of my home? Regularly fix the edges of walkways and driveways and fix any cracks. Remove anything that might make you trip as you walk through a door, such as a raised step or threshold. Trim any bushes or trees on the path to your home. Use bright outdoor lighting. Clear any walking paths of anything that might make someone trip, such as rocks or tools. Regularly check to see if handrails are loose or broken. Make sure that both sides of any steps have handrails. Any raised decks and porches should have guardrails on the edges. Have any leaves, snow, or ice cleared regularly. Use sand  or salt on walking paths during winter. Clean up any spills in your garage right away. This includes oil or grease spills. What can I do in the bathroom? Use night lights. Install grab bars by the toilet and in the tub and shower. Do not use towel bars as grab bars. Use non-skid mats or decals in the tub or shower. If you need to sit down in the shower, use a plastic, non-slip stool. Keep the floor dry. Clean up any water that spills on the floor as soon as it happens. Remove soap buildup in the tub or shower regularly. Attach bath mats securely with double-sided non-slip rug tape. Do not have throw rugs and other things on the floor that can make you trip. What can I do in the bedroom? Use night lights. Make sure that you have a light by your bed that is easy to reach. Do not use any sheets or blankets that are too big for your bed. They should not hang down onto the floor. Have a firm chair that has side arms. You can use this for support while you get dressed. Do not have throw rugs and  other things on the floor that can make you trip. What can I do in the kitchen? Clean up any spills right away. Avoid walking on wet floors. Keep items that you use a lot in easy-to-reach places. If you need to reach something above you, use a strong step stool that has a grab bar. Keep electrical cords out of the way. Do not use floor polish or wax that makes floors slippery. If you must use wax, use non-skid floor wax. Do not have throw rugs and other things on the floor that can make you trip. What can I do with my stairs? Do not leave any items on the stairs. Make sure that there are handrails on both sides of the stairs and use them. Fix handrails that are broken or loose. Make sure that handrails are as long as the stairways. Check any carpeting to make sure that it is firmly attached to the stairs. Fix any carpet that is loose or worn. Avoid having throw rugs at the top or bottom of the stairs.  If you do have throw rugs, attach them to the floor with carpet tape. Make sure that you have a light switch at the top of the stairs and the bottom of the stairs. If you do not have them, ask someone to add them for you. What else can I do to help prevent falls? Wear shoes that: Do not have high heels. Have rubber bottoms. Are comfortable and fit you well. Are closed at the toe. Do not wear sandals. If you use a stepladder: Make sure that it is fully opened. Do not climb a closed stepladder. Make sure that both sides of the stepladder are locked into place. Ask someone to hold it for you, if possible. Clearly mark and make sure that you can see: Any grab bars or handrails. First and last steps. Where the edge of each step is. Use tools that help you move around (mobility aids) if they are needed. These include: Canes. Walkers. Scooters. Crutches. Turn on the lights when you go into a dark area. Replace any light bulbs as soon as they burn out. Set up your furniture so you have a clear path. Avoid moving your furniture around. If any of your floors are uneven, fix them. If there are any pets around you, be aware of where they are. Review your medicines with your doctor. Some medicines can make you feel dizzy. This can increase your chance of falling. Ask your doctor what other things that you can do to help prevent falls. This information is not intended to replace advice given to you by your health care provider. Make sure you discuss any questions you have with your health care provider. Document Released: 10/31/2008 Document Revised: 06/12/2015 Document Reviewed: 02/08/2014 Elsevier Interactive Patient Education  2017 [employer]..

## 2023-11-08 ENCOUNTER — Other Ambulatory Visit: Payer: Self-pay | Admitting: Family Medicine

## 2023-11-09 ENCOUNTER — Other Ambulatory Visit: Payer: Self-pay | Admitting: Cardiology

## 2023-12-08 ENCOUNTER — Other Ambulatory Visit: Payer: Self-pay | Admitting: Family Medicine

## 2023-12-15 ENCOUNTER — Other Ambulatory Visit: Payer: Self-pay | Admitting: Family Medicine

## 2023-12-19 ENCOUNTER — Ambulatory Visit (INDEPENDENT_AMBULATORY_CARE_PROVIDER_SITE_OTHER)
Admission: RE | Admit: 2023-12-19 | Discharge: 2023-12-19 | Disposition: A | Source: Ambulatory Visit | Attending: Pulmonary Disease | Admitting: Radiology

## 2023-12-19 DIAGNOSIS — R911 Solitary pulmonary nodule: Secondary | ICD-10-CM | POA: Diagnosis not present

## 2023-12-19 DIAGNOSIS — R918 Other nonspecific abnormal finding of lung field: Secondary | ICD-10-CM | POA: Diagnosis not present

## 2023-12-21 ENCOUNTER — Encounter: Payer: Self-pay | Admitting: Pulmonary Disease

## 2023-12-21 ENCOUNTER — Ambulatory Visit: Admitting: Pulmonary Disease

## 2023-12-21 VITALS — BP 129/71 | HR 74 | Ht 67.5 in | Wt 226.0 lb

## 2023-12-21 DIAGNOSIS — R911 Solitary pulmonary nodule: Secondary | ICD-10-CM | POA: Diagnosis not present

## 2023-12-21 DIAGNOSIS — G4734 Idiopathic sleep related nonobstructive alveolar hypoventilation: Secondary | ICD-10-CM

## 2023-12-21 DIAGNOSIS — F17210 Nicotine dependence, cigarettes, uncomplicated: Secondary | ICD-10-CM | POA: Diagnosis not present

## 2023-12-21 DIAGNOSIS — J449 Chronic obstructive pulmonary disease, unspecified: Secondary | ICD-10-CM | POA: Diagnosis not present

## 2023-12-21 LAB — CBC WITH DIFFERENTIAL/PLATELET
Basophils Absolute: 0.1 K/uL (ref 0.0–0.1)
Basophils Relative: 0.8 % (ref 0.0–3.0)
Eosinophils Absolute: 0.1 K/uL (ref 0.0–0.7)
Eosinophils Relative: 1.5 % (ref 0.0–5.0)
HCT: 43.1 % (ref 39.0–52.0)
Hemoglobin: 14.1 g/dL (ref 13.0–17.0)
Lymphocytes Relative: 16.6 % (ref 12.0–46.0)
Lymphs Abs: 1.3 K/uL (ref 0.7–4.0)
MCHC: 32.8 g/dL (ref 30.0–36.0)
MCV: 95.6 fl (ref 78.0–100.0)
Monocytes Absolute: 0.8 K/uL (ref 0.1–1.0)
Monocytes Relative: 10.4 % (ref 3.0–12.0)
Neutro Abs: 5.7 K/uL (ref 1.4–7.7)
Neutrophils Relative %: 70.7 % (ref 43.0–77.0)
Platelets: 230 K/uL (ref 150.0–400.0)
RBC: 4.51 Mil/uL (ref 4.22–5.81)
RDW: 14.8 % (ref 11.5–15.5)
WBC: 8 K/uL (ref 4.0–10.5)

## 2023-12-21 NOTE — Progress Notes (Signed)
 Established Patient Pulmonology Office Visit   Subjective:  Patient ID: Kyle Farley, male    DOB: 03-Nov-1940  MRN: [id number]  CC:  Chief Complaint  Patient presents with   Medical Management of Chronic Issues    Pt states no new concerns     Discussed the use of AI scribe software for clinical note transcription with the patient, who gave verbal consent to proceed.  History of Present Illness Kyle Farley is an 83 year old male with COPD and nocturnal hypoxemia who returns for follow-up.  His breathing is stable and at baseline. He could not start Ohtuvayre  nebulizer treatments due to a cost of over five hundred dollars per month. He was treated for a COPD exacerbation in early September at last clinic visit and then hospitalized late September for his breathing. He has not needed further steroids or antibiotics since. He uses Trelegy Ellipta  one puff daily and Air Supra as needed but ran out of Trelegy yesterday and is unsure if he has more at home. He recalls a medication was stopped in the hospital for swelling but does not know which one.  A CT chest on December 19, 2023 was performed for follow-up of a left lung base nodule that measured five millimeters on CT from December 28, 2022. He is awaiting the final report to confirm stability but upon personal review, the nodule has resolved.  He continues to smoke about two packs per day.        ROS    Current Outpatient Medications:    albuterol  ([name] ) (2.5 MG/3ML) 0.083% nebulizer solution, Take 3 mLs (2.5 mg total) by nebulization every 6 (six) hours as needed for shortness of breath or wheezing., Disp: 120 mL, Rfl: 11   Albuterol -Budesonide  (AIRSUPRA ) 90-80 MCG/ACT AERO, Inhale 2 puffs into the lungs every 4 (four) hours as needed. (Patient taking differently: Inhale 2 puffs into the lungs every 4 (four) hours as needed (wheezing / sob).), Disp: 10.7 g, Rfl: 5   amiodarone  (PACERONE ) 200 MG tablet, Take 1 tablet  (200 mg total) by mouth daily., Disp: , Rfl:    apixaban  (ELIQUIS ) 5 MG TABS tablet, Take 1 tablet (5 mg total) by mouth 2 (two) times daily., Disp: 180 tablet, Rfl: 3   atorvastatin  (LIPITOR) 40 MG tablet, Take 1 tablet by mouth once daily, Disp: 90 tablet, Rfl: 2   ferrous sulfate  325 (65 FE) MG EC tablet, Take 325 mg by mouth daily with breakfast., Disp: , Rfl:    Fluticasone -Umeclidin-Vilant (TRELEGY ELLIPTA ) 200-62.5-25 MCG/ACT AEPB, Inhale 1 puff into the lungs daily., Disp: 180 each, Rfl: 3   furosemide  (LASIX ) 40 MG tablet, Take 1 tablet (40 mg total) by mouth daily., Disp: , Rfl:    JARDIANCE  25 MG TABS tablet, TAKE 1 TABLET BY MOUTH ONCE DAILY BEFORE BREAKFAST, Disp: 90 tablet, Rfl: 0   levothyroxine  (SYNTHROID ) 75 MCG tablet, Take 1 tablet (75 mcg total) by mouth daily., Disp: 90 tablet, Rfl: 3   Magnesium  400 MG CAPS, Take by mouth., Disp: , Rfl:    montelukast  (SINGULAIR ) 10 MG tablet, TAKE 1 TABLET BY MOUTH ONCE DAILY AT 12 NOON, Disp: 90 tablet, Rfl: 3   nitroGLYCERIN  (NITROSTAT ) 0.4 MG SL tablet, DISSOLVE ONE TABLET UNDER THE TONGUE EVERY 5 MINUTES AS NEEDED FOR CHEST PAIN.  DO NOT EXCEED A TOTAL OF 3 DOSES IN 15 MINUTES, Disp: 50 tablet, Rfl: 0   OXYGEN , Inhale 3 L into the lungs as needed (shortness of breath)., Disp: ,  Rfl:    pantoprazole  (PROTONIX ) 40 MG tablet, Take 1 tablet by mouth once daily, Disp: 90 tablet, Rfl: 1      Objective:  BP (!) 166/98   Pulse 74   Ht 5' 7.5 (1.715 m) Comment: per pt  Wt 226 lb (102.5 kg)   SpO2 100%   PF (!) 3 L/min Comment: POC  BMI 34.87 kg/m     Physical Exam Constitutional:      General: He is not in acute distress.    Appearance: Normal appearance.  Eyes:     General: No scleral icterus.    Conjunctiva/sclera: Conjunctivae normal.  Cardiovascular:     Rate and Rhythm: Normal rate and regular rhythm.  Pulmonary:     Breath sounds: No wheezing, rhonchi or rales.  Musculoskeletal:     Right lower leg: No edema.     Left  lower leg: No edema.  Skin:    General: Skin is warm and dry.  Neurological:     General: No focal deficit present.      Diagnostic Review:       Assessment & Plan:   Assessment & Plan Chronic obstructive pulmonary disease, unspecified COPD type (HCC)  Orders:   CBC with Differential/Platelet; Future   IgE; Future  Lung nodule     Nocturnal hypoxemia     Cigarette smoker      Assessment and Plan Assessment & Plan Chronic obstructive pulmonary disease (COPD) with recent exacerbation and ongoing management Ohtuvayre  not started due to cost. Discussed injectable medications for elevated inflammatory cells to reduce flares. - Provided [employer] number for Ohtuvayre  financial assistance. - Check CBC and IgE levels today - Continue Trelegy Ellipta  one puff daily. Teacher, Adult Education as needed.  Nicotine  dependence, cigarettes Continues smoking two packs daily.  Hypertension Blood pressure elevated initially. - Rechecked blood pressure at end of visit.  History of left lung base pulmonary nodule, resolved 5 mm nodule resolved on recent CT scan. Awaiting final radiologist report. - Await final radiologist report on recent CT scan.      Return in about 6 months (around 06/20/2024) for f/u visit Dr. Kara.   Dorn KATHEE Kara, MD

## 2023-12-21 NOTE — Assessment & Plan Note (Addendum)
 SABRA

## 2023-12-21 NOTE — Patient Instructions (Addendum)
 For Ohtuvayre  Nebulizer Medication If medication is unaffordable, patient will need to express financial hardship to be referred back to [facility] for patient assistance program pre-screening.   Patient ID: [id number] Pharmacy phone: [phone] [facility] Phone#: [phone]   We will check labs today to evaluate if you are a candidate for biologic injectable therapy  Continue trelegy ellipta  1 puff daily - rinse mouth out after each use  Continue airsupra  as needed and albuterol  nebs as needed  Follow up in 6 months

## 2023-12-22 LAB — IGE: IgE (Immunoglobulin E), Serum: 134 kU/L — ABNORMAL HIGH (ref <=114)

## 2023-12-26 ENCOUNTER — Ambulatory Visit: Payer: Self-pay | Admitting: Pulmonary Disease

## 2023-12-27 ENCOUNTER — Other Ambulatory Visit: Payer: Self-pay | Admitting: Family Medicine

## 2023-12-27 DIAGNOSIS — J410 Simple chronic bronchitis: Secondary | ICD-10-CM

## 2023-12-30 ENCOUNTER — Other Ambulatory Visit: Payer: Self-pay | Admitting: Cardiology

## 2024-01-08 ENCOUNTER — Other Ambulatory Visit: Payer: Self-pay | Admitting: Family Medicine

## 2024-01-16 ENCOUNTER — Encounter: Payer: Self-pay | Admitting: Podiatry

## 2024-01-16 ENCOUNTER — Ambulatory Visit: Admitting: Podiatry

## 2024-01-16 DIAGNOSIS — M79675 Pain in left toe(s): Secondary | ICD-10-CM | POA: Diagnosis not present

## 2024-01-16 DIAGNOSIS — B351 Tinea unguium: Secondary | ICD-10-CM

## 2024-01-16 DIAGNOSIS — M79674 Pain in right toe(s): Secondary | ICD-10-CM | POA: Diagnosis not present

## 2024-01-16 DIAGNOSIS — E1142 Type 2 diabetes mellitus with diabetic polyneuropathy: Secondary | ICD-10-CM

## 2024-01-18 NOTE — Progress Notes (Signed)
 "  Subjective:  Patient ID: Kyle Farley, male    DOB: September 02, 1940,  MRN: [id number]  Chief Complaint  Patient presents with   [facility] with the Qutenza  treatment.  Feet have been cleaned with soap and spounge.  Feels like he is walking on lumps or stones.  Elliquis    83 y.o. male presents with the above complaint. History confirmed with patient. Patient presenting with pain related to dystrophic thickened elongated nails. Patient is unable to trim own nails related to nail dystrophy and/or mobility issues. Patient does  have a history of T2DM.  Is on chronic Eliquis .  He reports neuropathic pain, burning, tingling affecting the toes, plantar forefoot, dorsum of both feet, also reports that feels like he is walking on lumps diffusely.  He is also here for Qutenza  treatment. This is his 3rd treatment, he reports it has been mildly helpful.  Objective:  Physical exam: Warm, cap refill approximately 3 seconds to the digits, pedal hair growth absent.  Pedal skin atrophic, xerotic.  Webspaces clean and dry nail exam onychomycosis of the toenails, onycholysis, and dystrophic nails, greater than 3 mm thickening DP pulses palpable, protective sensation absent, and vibratory sensation absent, PT pulses faintly palpable.  Mild +1 pitting edema Left Foot:  Pain with palpation of nails due to elongation and dystrophic growth.  Right Foot: Pain with palpation of nails due to elongation and dystrophic growth. No palpable soft tissue masses.  No findings of interdigital neuroma.  Assessment:   1. Diabetic polyneuropathy associated with type 2 diabetes mellitus (HCC)   2. Pain due to onychomycosis of toenails of both feet        Plan:  Patient was evaluated and treated and all questions answered.  #Onychomycosis with pain  -Nails palliatively debrided as below. -Educated on self-care -At risk foot care due to diabetes with neuropathy and chronic anticoagulation on eliquis .  Procedure: Nail  Debridement Rationale: Pain Type of Debridement: manual, sharp debridement. Instrumentation: Nail nipper, rotary burr. Number of Nails: 10   # Diabetes with neuropathy Patient educated on diabetes. Discussed proper diabetic foot care and discussed risks and complications of disease. Educated patient in depth on reasons to return to the office immediately should he/she discover anything concerning or new on the feet. All questions answered. Discussed proper shoes as well.    Procedure: Qutenza  application for diabetic neuropathy - Verbal consent was obtained to administer Qutenza  treatment, second treatment for patient - Left and right foot were washed with soapy water prior to the treatment.  They are allowed to completely dry before initiating treatment.  2 sheets of the Qutenza  were applied to each foot taking care to cover the entirety of the toes and the plantar forefoot bilaterally, entire dorsal forefoot, midfoot and hindfoot of both feet where patient reports maximum symptoms.  The shoes were then secured in place using Coban - The medication were allowed to remain in place for 30 minutes.  There were then probably removed and the feet were cleansed with manufacture provided cleansing solution and rinsed off with soapy water and thoroughly dried. - Patient tolerated procedure well without any adverse effects or reaction - Will determine continuing treatment if patient reports improvement of symptoms.   Return in about 3 months (around 04/15/2024) for Diabetic Foot Care.         Ethan Saddler, DPM [facility] / [facility]                     "

## 2024-01-27 ENCOUNTER — Other Ambulatory Visit: Payer: Self-pay | Admitting: Family Medicine

## 2024-02-01 ENCOUNTER — Ambulatory Visit: Admitting: Physician Assistant

## 2024-02-01 ENCOUNTER — Encounter: Payer: Self-pay | Admitting: Physician Assistant

## 2024-02-01 VITALS — BP 138/68 | HR 75 | Temp 97.8°F | Ht 67.5 in | Wt 227.0 lb

## 2024-02-01 DIAGNOSIS — J449 Chronic obstructive pulmonary disease, unspecified: Secondary | ICD-10-CM

## 2024-02-01 DIAGNOSIS — F1014 Alcohol abuse with alcohol-induced mood disorder: Secondary | ICD-10-CM

## 2024-02-01 DIAGNOSIS — K219 Gastro-esophageal reflux disease without esophagitis: Secondary | ICD-10-CM | POA: Diagnosis not present

## 2024-02-01 DIAGNOSIS — Z9981 Dependence on supplemental oxygen: Secondary | ICD-10-CM

## 2024-02-01 DIAGNOSIS — J9601 Acute respiratory failure with hypoxia: Secondary | ICD-10-CM

## 2024-02-01 DIAGNOSIS — E1122 Type 2 diabetes mellitus with diabetic chronic kidney disease: Secondary | ICD-10-CM | POA: Diagnosis not present

## 2024-02-01 DIAGNOSIS — I480 Paroxysmal atrial fibrillation: Secondary | ICD-10-CM

## 2024-02-01 DIAGNOSIS — E039 Hypothyroidism, unspecified: Secondary | ICD-10-CM | POA: Diagnosis not present

## 2024-02-01 DIAGNOSIS — N1832 Chronic kidney disease, stage 3b: Secondary | ICD-10-CM | POA: Insufficient documentation

## 2024-02-01 DIAGNOSIS — I498 Other specified cardiac arrhythmias: Secondary | ICD-10-CM | POA: Diagnosis not present

## 2024-02-01 DIAGNOSIS — I10 Essential (primary) hypertension: Secondary | ICD-10-CM

## 2024-02-01 DIAGNOSIS — E782 Mixed hyperlipidemia: Secondary | ICD-10-CM

## 2024-02-01 DIAGNOSIS — I5023 Acute on chronic systolic (congestive) heart failure: Secondary | ICD-10-CM

## 2024-02-01 NOTE — Assessment & Plan Note (Addendum)
 Denies any new or worsening symptoms Last labs done in September showed stable kidney function Is planning on getting labs done with cardiology in the next few weeks Will have blood work done then.

## 2024-02-01 NOTE — Assessment & Plan Note (Addendum)
 Admits to continue alcohol use including moonshine  Admits to decreased amounts compared to previously Continue to monitor alcohol intake

## 2024-02-01 NOTE — Assessment & Plan Note (Addendum)
 Denies any new or worsening symptoms Continue taking Pacerone  200mg  as prescribed Continue to follow up with cardiology Will get labs drawn by them in a few weeks.

## 2024-02-01 NOTE — Assessment & Plan Note (Addendum)
 Controlled Continue to monitor symptoms Denies any new or worsening symptoms Last thyroid  normal in October Will draw again at next follow up Continue taking synthroid  75mcg as prescribed

## 2024-02-01 NOTE — Assessment & Plan Note (Addendum)
 Acute on chronic systolic heart failure Well-managed blood pressure. Gabapentin  discontinued due to swelling.

## 2024-02-01 NOTE — Progress Notes (Signed)
 "  Subjective:  Patient ID: Kyle Farley, male    DOB: 12-22-1940  Age: 84 y.o. MRN: [id number]  Chief Complaint  Patient presents with   Medical Management of Chronic Issues    HPI: Discussed the use of AI scribe software for clinical note transcription with the patient, who gave verbal consent to proceed.  History of Present Illness Kyle Farley is an 84 year old male with hypertension and COPD who presents for a chronic follow-up. He is accompanied by his sister, Nichole.  He has been experiencing urinary symptoms, including nocturia and difficulty initiating urination, which have progressively worsened over the past few years. He describes a lack of urinary stream force and the need to strain during urination. He is currently taking Flomax . At home, he uses a bucket to avoid walking too much at night due to frequent urination.  He has a history of COPD and has been in contact with a pulmonologist regarding medication options. A previously recommended medication was declined due to its high cost. He has not received further communication from the pulmonologist regarding alternative treatments.  He is under the care of a podiatrist for foot issues. Despite treatment, including foot wrapping, he continues to experience difficulty walking. A follow-up appointment is scheduled for next month.  His current medication regimen includes amlodipine  taken three times daily and a vasodilator at night. He also takes a blood thinner, which he believes contributes to feeling cold. No swelling since discontinuing gabapentin , which was previously causing edema.           02/01/2024    9:53 AM 11/07/2023    9:55 AM 06/21/2023   10:19 AM 10/20/2022    9:17 AM 08/05/2022   11:08 AM  Depression screen PHQ 2/9  Decreased Interest 0 0 0 0 0  Down, Depressed, Hopeless 0 0 0 0 0  PHQ - 2 Score 0 0 0 0 0  Altered sleeping 0 0   0  Tired, decreased energy 0 1   0  Change in appetite 0 0   0  Feeling  bad or failure about yourself  0 0   0  Trouble concentrating 0 0   0  Moving slowly or fidgety/restless 0 0   0  Suicidal thoughts 0 0   0  PHQ-9 Score 0 1    0   Difficult doing work/chores Not difficult at all Not difficult at all   Not difficult at all     Data saved with a previous flowsheet row definition        02/01/2024    9:53 AM  Fall Risk   Falls in the past year? 0  Number falls in past yr: 0  Injury with Fall? 0  Risk for fall due to : No Fall Risks  Follow up Falls evaluation completed    Patient Care Team: Sherre Clapper, MD as PCP - General (Family Medicine) Monetta Redell PARAS, MD as PCP - Cardiology (Cardiology) Inocencio Soyla Lunger, MD as Consulting Physician (Cardiology) Monetta Redell PARAS, MD as Consulting Physician (Cardiology) Carlin Delon BROCKS, NP as Nurse Practitioner (Cardiology) Kara Dorn NOVAK, MD as Consulting Physician (Pulmonary Disease) Lamount Ethan LITTIE, DPM as Consulting Physician (Podiatry)   Review of Systems  Constitutional:  Negative for appetite change, fatigue and fever.  HENT:  Negative for congestion, ear pain, sinus pressure and sore throat.   Respiratory:  Positive for shortness of breath (Chronic COPD) and wheezing (Chronic COPD). Negative for cough and  chest tightness.   Cardiovascular:  Negative for chest pain and palpitations.  Gastrointestinal:  Negative for abdominal pain, constipation, diarrhea, nausea and vomiting.  Genitourinary:  Negative for dysuria and hematuria.  Musculoskeletal:  Negative for arthralgias, back pain, joint swelling and myalgias.  Skin:  Negative for rash.  Neurological:  Negative for dizziness, weakness and headaches.  Psychiatric/Behavioral:  Negative for dysphoric mood. The patient is not nervous/anxious.     Medications Ordered Prior to Encounter[1] Past Medical History:  Diagnosis Date   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 11/10/2021   Acquired hypothyroidism 11/10/2021   Acute hypoxic respiratory  failure (HCC) 04/25/2022   Acute on chronic diastolic heart failure (HCC) 04/25/2022   Acute on chronic respiratory failure with hypoxia (HCC) 04/25/2022   Acute on chronic systolic heart failure (HCC) 11/10/2021   AKI (acute kidney injury) 06/08/2022   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/10/2021   Alcohol use 09/12/2020   Anemia of chronic disease 04/25/2022   Arthritis    Atherosclerosis of native arteries of extremities with intermittent claudication, bilateral legs 11/10/2021   Bilateral leg edema 02/16/2022   BPH (benign prostatic hyperplasia) 11/10/2021   CHF (congestive heart failure) (HCC)    Chronic diastolic CHF (congestive heart failure) (HCC) 04/25/2022   Chronic hyponatremia 04/25/2022   Chronic respiratory failure with hypoxia (HCC) 05/09/2022   Cigarette smoker 10/21/2014   Class 1 obesity 04/25/2022   Cognitive communication deficit 03/05/2021   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)    Diabetic polyneuropathy associated with type 2 diabetes mellitus (HCC) 08/28/2022   Dyspnea    Dysrhythmia    atrial fibrillation   Elevated glucose 05/27/2022   Encounter for prostate cancer screening 02/16/2022   Encounter for screening for lung cancer 02/16/2022   Essential hypertension 10/21/2014   Femur fracture, right (HCC) 09/12/2020   GERD (gastroesophageal reflux disease)    History of kidney stones    Hyperlipidemia 01/24/2017   Hypertension    Hypo-osmolality and hyponatremia 03/05/2021   Hyponatremia 04/26/2022   Hypotension 08/05/2022   Kidney stones 01/24/2017   Laceration of right eyebrow 06/20/2022   Medication monitoring encounter 12/18/2022   Moderate aortic stenosis 2024   Muscle wasting and atrophy, not elsewhere classified, multiple sites 03/05/2021   Nicotine  dependence, cigarettes, uncomplicated 03/05/2021   Non-seasonal allergic rhinitis due to pollen 06/06/2022   Other abnormalities of gait and mobility 03/05/2021    Other chest pain 12/18/2022   Other lack of coordination 03/05/2021   Oxygen  dependent 08/05/2022   Paroxysmal atrial fibrillation (HCC) 10/21/2014   Pressure injury of skin 09/16/2020   Right ureteral stone 06/08/2022   Sepsis secondary to UTI (HCC) 06/08/2022   SOB (shortness of breath) 05/10/2022   Syncope and collapse 08/05/2022   Tobacco abuse 02/16/2022   Tobacco use disorder 02/16/2022   Type 2 diabetes mellitus (HCC) 06/08/2022   Visit for suture removal 06/15/2022   Past Surgical History:  Procedure Laterality Date   LEFT HEART CATH AND CORONARY ANGIOGRAPHY N/A 09/15/2020   Procedure: LEFT HEART CATH AND CORONARY ANGIOGRAPHY;  Surgeon: Wonda Sharper, MD;  Location: [facility] INVASIVE CV LAB;  Service: Cardiovascular;  Laterality: N/A;   LEG SURGERY Right    steal pin placed in right leg 35 years ago   ORIF FEMUR FRACTURE Right 09/15/2020   Procedure: OPEN REDUCTION INTERNAL FIXATION FEMORAL SHAFT FRACTURE;  Surgeon: Kendal Franky SQUIBB, MD;  Location: [facility] OR;  Service: Orthopedics;  Laterality: Right;    Family History  Problem Relation Age of Onset   Cancer Mother    Diabetes Mother    Breast cancer Sister    Social History   Socioeconomic History   Marital status: Widowed    Spouse name: Not on file   Number of children: 3   Years of education: Not on file   Highest education level: High school graduate  Occupational History   Occupation: retired  Tobacco Use   Smoking status: Every Day    Current packs/day: 1.50    Average packs/day: 1.5 packs/day for 62.0 years (93.0 ttl pk-yrs)    Types: Cigarettes, Cigars   Smokeless tobacco: Never  Vaping Use   Vaping status: Never Used  Substance and Sexual Activity   Alcohol use: Yes    Alcohol/week: 5.0 standard drinks of alcohol    Types: 5 Standard drinks or equivalent per week    Comment: 4-5 beers per day   Drug use: No   Sexual activity: Not Currently  Other Topics Concern   Not on file  Social History Narrative    Not on file   Social Drivers of Health   Tobacco Use: High Risk (02/01/2024)   Patient History    Smoking Tobacco Use: Every Day    Smokeless Tobacco Use: Never    Passive Exposure: Not on file  Financial Resource Strain: Low Risk (06/21/2023)   Overall Financial Resource Strain (CARDIA)    Difficulty of Paying Living Expenses: Not hard at all  Food Insecurity: No Food Insecurity (10/12/2023)   Epic    Worried About Radiation Protection Practitioner of Food in the Last Year: Never true    Ran Out of Food in the Last Year: Never true  Transportation Needs: No Transportation Needs (10/12/2023)   Epic    Lack of Transportation (Medical): No    Lack of Transportation (Non-Medical): No  Physical Activity: Inactive (06/21/2023)   Exercise Vital Sign    Days of Exercise per Week: 0 days    Minutes of Exercise per Session: 0 min  Stress: No Stress Concern Present (06/21/2023)   [employer] of Occupational Health - Occupational Stress Questionnaire    Feeling of Stress : Not at all  Social Connections: Moderately Isolated (10/12/2023)   Social Connection and Isolation Panel    Frequency of Communication with Friends and Family: More than three times a week    Frequency of Social Gatherings with Friends and Family: More than three times a week    Attends Religious Services: More than 4 times per year    Active Member of [employer] or Organizations: No    Attends Banker Meetings: Never    Marital Status: Widowed  Depression (PHQ2-9): Low Risk (02/01/2024)   Depression (PHQ2-9)    PHQ-2 Score: 0  Alcohol Screen: Low Risk (06/21/2023)   Alcohol Screen    Last Alcohol Screening Score (AUDIT): 0  Housing: Low Risk (10/12/2023)   Epic    Unable to Pay for Housing in the Last Year: No    Number of Times Moved in the Last Year: 0    Homeless in the Last Year: No  Utilities: Not At Risk (10/12/2023)   Epic    Threatened with loss of utilities: No  Recent Concern: Utilities - At Risk (07/27/2023)   Epic     Threatened with loss of utilities: Yes  Health Literacy: Inadequate Health Literacy (08/17/2022)   B1300 Health Literacy    Frequency of need for help with medical instructions: Sometimes  Objective:  BP 138/68 (BP Location: Left Arm, Patient Position: Sitting)   Pulse 75   Temp 97.8 F (36.6 C) (Temporal)   Ht 5' 7.5 (1.715 m)   Wt 227 lb (103 kg)   SpO2 90%   BMI 35.03 kg/m      02/01/2024    9:50 AM 12/21/2023    9:21 AM 12/21/2023    8:57 AM  BP/Weight  Systolic BP 138 129 166  Diastolic BP 68 71 98  Wt. (Lbs) 227  226  BMI 35.03 kg/m2  34.87 kg/m2    Physical Exam Vitals reviewed.  Constitutional:      Appearance: Normal appearance.  Cardiovascular:     Rate and Rhythm: Normal rate. Rhythm irregular.     Heart sounds: Murmur heard.  Pulmonary:     Effort: Pulmonary effort is normal.     Breath sounds: Wheezing present.  Abdominal:     General: Bowel sounds are normal.     Palpations: Abdomen is soft.     Tenderness: There is no abdominal tenderness.  Neurological:     Mental Status: He is alert and oriented to person, place, and time.  Psychiatric:        Mood and Affect: Mood normal.        Behavior: Behavior normal.      Lab Results  Component Value Date   WBC 8.0 12/21/2023   HGB 14.1 12/21/2023   HCT 43.1 12/21/2023   PLT 230.0 12/21/2023   GLUCOSE 95 10/13/2023   CHOL 156 06/21/2023   TRIG 105 06/21/2023   HDL 59 06/21/2023   LDLCALC 78 06/21/2023   ALT 27 10/08/2023   AST 28 10/08/2023   NA 137 10/13/2023   K 4.2 10/13/2023   CL 98 10/13/2023   CREATININE 1.09 10/13/2023   BUN 26 (H) 10/13/2023   CO2 30 10/13/2023   TSH 2.900 10/31/2023   HGBA1C 5.9 (H) 10/09/2023    Results for orders placed or performed in visit on 12/21/23  IgE   Collection Time: 12/21/23  9:23 AM  Result Value Ref Range   IgE (Immunoglobulin E), Serum 134 (H) <OR=114 kU/L  CBC with Differential/Platelet   Collection Time: 12/21/23  9:23 AM  Result Value  Ref Range   WBC 8.0 4.0 - 10.5 K/uL   RBC 4.51 4.22 - 5.81 Mil/uL   Hemoglobin 14.1 13.0 - 17.0 g/dL   HCT 56.8 60.9 - 47.9 %   MCV 95.6 78.0 - 100.0 fl   MCHC 32.8 30.0 - 36.0 g/dL   RDW 85.1 88.4 - 84.4 %   Platelets 230.0 150.0 - 400.0 K/uL   Neutrophils Relative % 70.7 43.0 - 77.0 %   Lymphocytes Relative 16.6 12.0 - 46.0 %   Monocytes Relative 10.4 3.0 - 12.0 %   Eosinophils Relative 1.5 0.0 - 5.0 %   Basophils Relative 0.8 0.0 - 3.0 %   Neutro Abs 5.7 1.4 - 7.7 K/uL   Lymphs Abs 1.3 0.7 - 4.0 K/uL   Monocytes Absolute 0.8 0.1 - 1.0 K/uL   Eosinophils Absolute 0.1 0.0 - 0.7 K/uL   Basophils Absolute 0.1 0.0 - 0.1 K/uL  .  Assessment & Plan:   Assessment & Plan Paroxysmal atrial fibrillation (HCC) Controlled Denies any new or worsening symptoms Continue taking Eliquis  5mg  as prescribed Has follow up scheduled with cardiology Will get labs done then    Essential hypertension Controlled Denies any new or abnormal readings Continue to monitor at home Continue  to follow up with cardiology    Mixed hyperlipidemia Controlled  Continue to monitor diet and exercise Continue taking Lipitor 40mg  as prescribed    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) Chronic obstructive pulmonary disease COPD with elevated inflammatory markers. Dupixent discussed as an alternative due to cost issues with previous medication. - Consider Dupixent for inflammation management with Dr. Kara - Continue using Trelegy, Airsupra , and Albuterol  as prescribed    Junctional rhythm Denies any new or worsening symptoms Continue taking Pacerone  200mg  as prescribed Continue to follow up with cardiology Will get labs drawn by them in a few weeks.     Acquired hypothyroidism Controlled Continue to monitor symptoms Denies any new or worsening symptoms Last thyroid  normal in October Will draw again at next follow up Continue taking synthroid  75mcg as prescribed    Type 2 diabetes  mellitus with diabetic chronic kidney disease, unspecified CKD stage, unspecified whether long term insulin  use (HCC) Controlled Continue using Jardiance  25mg  as prescribed    Oxygen  dependent Continue to use oxygen  at home and portable unit  Continue to monitor oxygen  saturation Continue to follow up with pulmonology.    Gastroesophageal reflux disease without esophagitis Controlled on protonix  40mg  Continue to monitor symptoms Will adjust treatment based on symptoms    Chronic kidney disease, stage 3b (HCC) Denies any new or worsening symptoms Last labs done in September showed stable kidney function Is planning on getting labs done with cardiology in the next few weeks Will have blood work done then.    Acute respiratory failure with hypoxia (HCC) Currently on oxygen  at home and has unit currently Was not able to get new treatment suggested by pulmonologist due to cost Discussed dupixent as note from pulmonologist suggested that next Will follow up with Dr. Kara to discuss starting dupixent.     Acute on chronic systolic congestive heart failure (HCC) Acute on chronic systolic heart failure Well-managed blood pressure. Gabapentin  discontinued due to swelling.    Alcohol abuse with alcohol-induced mood disorder (HCC) Admits to continue alcohol use including moonshine  Admits to decreased amounts compared to previously Continue to monitor alcohol intake    Obesity, morbid (HCC) New diagnosis based on BMI and 2 comorbidities Comorbidity include Alcohol abuse, Essential Hypertension, Type 2 Diabetes, COPD, Acute on Chronic congestive heart failure, CKD 3b, Atrial fibrillation, and Junctional Rhythm. Continue to monitor diet and exercise Will have labs drawn by cardiology in the next few weeks Continue to follow up with specialist as scheduled      Body mass index is 35.03 kg/m.    No orders of the defined types were placed in this encounter.   No orders  of the defined types were placed in this encounter.      Follow-up: Return in about 3 months (around 05/01/2024) for Chronic.  An After Visit Summary was printed and given to the patient.    I,Lauren M Auman,acting as a neurosurgeon for [employer].,have documented all relevant documentation on the behalf of Nola Angles, PA,as directed by  Nola Angles, PA while in the presence of Nola Angles, [place].    Nola Angles, [place] [name] [phone]     [1]  Current Outpatient Medications on File Prior to Visit  Medication Sig Dispense Refill   AIRSUPRA  90-80 MCG/ACT AERO INHALE 2 PUFFS EVERY 4 HOURS AS NEEDED 11 g 0   albuterol  ([name] ) (2.5 MG/3ML) 0.083% nebulizer solution Take 3 mLs (2.5 mg total) by nebulization every 6 (  six) hours as needed for shortness of breath or wheezing. 120 mL 11   amiodarone  (PACERONE ) 200 MG tablet Take 1 tablet (200 mg total) by mouth daily.     apixaban  (ELIQUIS ) 5 MG TABS tablet Take 1 tablet (5 mg total) by mouth 2 (two) times daily. 180 tablet 3   atorvastatin  (LIPITOR) 40 MG tablet Take 1 tablet by mouth once daily 90 tablet 2   ferrous sulfate  325 (65 FE) MG EC tablet Take 325 mg by mouth daily with breakfast.     Fluticasone -Umeclidin-Vilant (TRELEGY ELLIPTA ) 200-62.5-25 MCG/ACT AEPB Inhale 1 puff into the lungs daily. 180 each 3   furosemide  (LASIX ) 40 MG tablet Take 1 tablet (40 mg total) by mouth daily.     JARDIANCE  25 MG TABS tablet TAKE 1 TABLET BY MOUTH ONCE DAILY BEFORE BREAKFAST 90 tablet 0   levothyroxine  (SYNTHROID ) 75 MCG tablet Take 1 tablet (75 mcg total) by mouth daily. 90 tablet 3   Magnesium  400 MG CAPS Take by mouth.     montelukast  (SINGULAIR ) 10 MG tablet TAKE 1 TABLET BY MOUTH ONCE DAILY AT 12 NOON 90 tablet 3   nitroGLYCERIN  (NITROSTAT ) 0.4 MG SL tablet DISSOLVE ONE TABLET UNDER THE TONGUE EVERY 5 MINUTES AS NEEDED FOR CHEST PAIN.  DO NOT EXCEED A TOTAL OF 3 DOSES IN 15 MINUTES 50 tablet 0   OXYGEN  Inhale 3 L into the  lungs as needed (shortness of breath).     pantoprazole  (PROTONIX ) 40 MG tablet Take 1 tablet by mouth once daily 90 tablet 0   No current facility-administered medications on file prior to visit.   "

## 2024-02-01 NOTE — Assessment & Plan Note (Addendum)
 Controlled Denies any new or abnormal readings Continue to monitor at home Continue to follow up with cardiology

## 2024-02-01 NOTE — Assessment & Plan Note (Addendum)
 Controlled  Continue to monitor diet and exercise Continue taking Lipitor 40mg  as prescribed

## 2024-02-01 NOTE — Assessment & Plan Note (Addendum)
 Controlled Denies any new or worsening symptoms Continue taking Eliquis  5mg  as prescribed Has follow up scheduled with cardiology Will get labs done then

## 2024-02-01 NOTE — Assessment & Plan Note (Addendum)
 Continue to use oxygen  at home and portable unit  Continue to monitor oxygen  saturation Continue to follow up with pulmonology.

## 2024-02-01 NOTE — Assessment & Plan Note (Addendum)
 Chronic obstructive pulmonary disease COPD with elevated inflammatory markers. Dupixent discussed as an alternative due to cost issues with previous medication. - Consider Dupixent for inflammation management with Dr. Kara - Continue using Trelegy, Airsupra , and Albuterol  as prescribed

## 2024-02-01 NOTE — Assessment & Plan Note (Addendum)
 Controlled on protonix  40mg  Continue to monitor symptoms Will adjust treatment based on symptoms

## 2024-02-01 NOTE — Assessment & Plan Note (Addendum)
 Controlled Continue using Jardiance  25mg  as prescribed

## 2024-02-02 MED ORDER — TAMSULOSIN HCL 0.4 MG PO CAPS: 0.4000 mg | ORAL_CAPSULE | Freq: Every day | ORAL | 3 refills | Status: AC

## 2024-02-02 NOTE — Assessment & Plan Note (Signed)
 New diagnosis based on BMI and 2 comorbidities Comorbidity include Alcohol abuse, Essential Hypertension, Type 2 Diabetes, COPD, Acute on Chronic congestive heart failure, CKD 3b, Atrial fibrillation, and Junctional Rhythm. Continue to monitor diet and exercise Will have labs drawn by cardiology in the next few weeks Continue to follow up with specialist as scheduled

## 2024-02-04 ENCOUNTER — Other Ambulatory Visit: Payer: Self-pay | Admitting: Family Medicine

## 2024-02-04 DIAGNOSIS — J410 Simple chronic bronchitis: Secondary | ICD-10-CM

## 2024-02-12 ENCOUNTER — Other Ambulatory Visit: Payer: Self-pay | Admitting: Family Medicine

## 2024-02-12 DIAGNOSIS — J410 Simple chronic bronchitis: Secondary | ICD-10-CM

## 2024-03-13 ENCOUNTER — Ambulatory Visit: Admitting: Cardiology

## 2024-04-17 ENCOUNTER — Ambulatory Visit: Admitting: Podiatry

## 2024-05-07 ENCOUNTER — Ambulatory Visit: Admitting: Family Medicine

## 2024-11-07 ENCOUNTER — Ambulatory Visit
